# Patient Record
Sex: Female | Born: 1965
Health system: Southern US, Community
[De-identification: ages and names within clinical notes are randomized; demographics above are authoritative.]

## PROBLEM LIST (undated history)

## (undated) DIAGNOSIS — Z853 Personal history of malignant neoplasm of breast: Secondary | ICD-10-CM

## (undated) DIAGNOSIS — E785 Hyperlipidemia, unspecified: Secondary | ICD-10-CM

## (undated) DIAGNOSIS — K219 Gastro-esophageal reflux disease without esophagitis: Secondary | ICD-10-CM

## (undated) DIAGNOSIS — F3289 Other specified depressive episodes: Secondary | ICD-10-CM

## (undated) DIAGNOSIS — F329 Major depressive disorder, single episode, unspecified: Secondary | ICD-10-CM

## (undated) DIAGNOSIS — C801 Malignant (primary) neoplasm, unspecified: Secondary | ICD-10-CM

## (undated) DIAGNOSIS — F411 Generalized anxiety disorder: Secondary | ICD-10-CM

## (undated) DIAGNOSIS — Z923 Personal history of irradiation: Secondary | ICD-10-CM

## (undated) DIAGNOSIS — C7951 Secondary malignant neoplasm of bone: Secondary | ICD-10-CM

## (undated) DIAGNOSIS — J309 Allergic rhinitis, unspecified: Secondary | ICD-10-CM

## (undated) DIAGNOSIS — D509 Iron deficiency anemia, unspecified: Secondary | ICD-10-CM

## (undated) DIAGNOSIS — N200 Calculus of kidney: Secondary | ICD-10-CM

## (undated) HISTORY — PX: TONSILLECTOMY: SUR1361

## (undated) HISTORY — DX: Gastro-esophageal reflux disease without esophagitis: K21.9

## (undated) HISTORY — DX: Allergic rhinitis, unspecified: J30.9

## (undated) HISTORY — DX: Generalized anxiety disorder: F41.1

## (undated) HISTORY — DX: Other specified depressive episodes: F32.89

## (undated) HISTORY — DX: Calculus of kidney: N20.0

## (undated) HISTORY — DX: Hyperlipidemia, unspecified: E78.5

## (undated) HISTORY — PX: TEMPOROMANDIBULAR JOINT SURGERY: SHX35

## (undated) HISTORY — PX: MASTECTOMY: SHX3

## (undated) HISTORY — DX: Iron deficiency anemia, unspecified: D50.9

## (undated) HISTORY — DX: Personal history of malignant neoplasm of breast: Z85.3

## (undated) HISTORY — PX: BREAST ENHANCEMENT SURGERY: SHX7

## (undated) HISTORY — DX: Major depressive disorder, single episode, unspecified: F32.9

---

## 1997-05-09 ENCOUNTER — Encounter: Payer: Self-pay | Admitting: Internal Medicine

## 1997-05-09 LAB — CONVERTED CEMR LAB

## 1997-05-12 ENCOUNTER — Encounter: Payer: Self-pay | Admitting: Internal Medicine

## 1997-05-12 LAB — CONVERTED CEMR LAB

## 2001-04-23 ENCOUNTER — Other Ambulatory Visit: Admission: RE | Admit: 2001-04-23 | Discharge: 2001-04-23 | Payer: Self-pay | Admitting: Gynecology

## 2001-05-21 ENCOUNTER — Other Ambulatory Visit: Admission: RE | Admit: 2001-05-21 | Discharge: 2001-05-21 | Payer: Self-pay | Admitting: Gynecology

## 2003-09-10 ENCOUNTER — Emergency Department (HOSPITAL_COMMUNITY): Admission: EM | Admit: 2003-09-10 | Discharge: 2003-09-10 | Payer: Self-pay | Admitting: Emergency Medicine

## 2004-03-23 ENCOUNTER — Other Ambulatory Visit: Admission: RE | Admit: 2004-03-23 | Discharge: 2004-03-23 | Payer: Self-pay | Admitting: Gynecology

## 2004-04-15 ENCOUNTER — Other Ambulatory Visit: Admission: RE | Admit: 2004-04-15 | Discharge: 2004-04-15 | Payer: Self-pay | Admitting: Gynecology

## 2004-04-23 ENCOUNTER — Ambulatory Visit: Payer: Self-pay | Admitting: Internal Medicine

## 2004-05-24 ENCOUNTER — Ambulatory Visit: Payer: Self-pay | Admitting: Internal Medicine

## 2004-05-26 ENCOUNTER — Ambulatory Visit: Payer: Self-pay | Admitting: Internal Medicine

## 2004-10-25 ENCOUNTER — Emergency Department (HOSPITAL_COMMUNITY): Admission: EM | Admit: 2004-10-25 | Discharge: 2004-10-25 | Payer: Self-pay | Admitting: Emergency Medicine

## 2005-07-20 ENCOUNTER — Ambulatory Visit: Payer: Self-pay | Admitting: Internal Medicine

## 2005-12-08 ENCOUNTER — Ambulatory Visit: Payer: Self-pay | Admitting: Internal Medicine

## 2006-01-02 ENCOUNTER — Ambulatory Visit: Payer: Self-pay | Admitting: Internal Medicine

## 2006-01-24 ENCOUNTER — Ambulatory Visit: Payer: Self-pay | Admitting: Gastroenterology

## 2006-05-20 ENCOUNTER — Ambulatory Visit: Payer: Self-pay | Admitting: Family Medicine

## 2006-07-26 ENCOUNTER — Ambulatory Visit: Payer: Self-pay | Admitting: Internal Medicine

## 2006-08-12 ENCOUNTER — Ambulatory Visit: Payer: Self-pay | Admitting: Family Medicine

## 2006-11-14 ENCOUNTER — Ambulatory Visit: Payer: Self-pay | Admitting: Internal Medicine

## 2007-01-02 ENCOUNTER — Encounter: Payer: Self-pay | Admitting: Internal Medicine

## 2007-01-16 ENCOUNTER — Ambulatory Visit: Payer: Self-pay | Admitting: Internal Medicine

## 2007-01-21 ENCOUNTER — Encounter: Payer: Self-pay | Admitting: Internal Medicine

## 2007-01-21 DIAGNOSIS — N200 Calculus of kidney: Secondary | ICD-10-CM | POA: Insufficient documentation

## 2007-01-21 DIAGNOSIS — Z853 Personal history of malignant neoplasm of breast: Secondary | ICD-10-CM

## 2007-01-21 DIAGNOSIS — J309 Allergic rhinitis, unspecified: Secondary | ICD-10-CM | POA: Insufficient documentation

## 2007-01-21 DIAGNOSIS — F411 Generalized anxiety disorder: Secondary | ICD-10-CM | POA: Insufficient documentation

## 2007-01-21 DIAGNOSIS — F32A Depression, unspecified: Secondary | ICD-10-CM | POA: Insufficient documentation

## 2007-01-21 DIAGNOSIS — F329 Major depressive disorder, single episode, unspecified: Secondary | ICD-10-CM

## 2007-01-21 DIAGNOSIS — E785 Hyperlipidemia, unspecified: Secondary | ICD-10-CM | POA: Insufficient documentation

## 2007-01-21 HISTORY — DX: Personal history of malignant neoplasm of breast: Z85.3

## 2007-09-08 ENCOUNTER — Ambulatory Visit: Payer: Self-pay | Admitting: Internal Medicine

## 2007-09-08 DIAGNOSIS — J019 Acute sinusitis, unspecified: Secondary | ICD-10-CM | POA: Insufficient documentation

## 2007-09-18 ENCOUNTER — Encounter: Payer: Self-pay | Admitting: Internal Medicine

## 2007-09-21 ENCOUNTER — Telehealth: Payer: Self-pay | Admitting: Internal Medicine

## 2007-10-11 ENCOUNTER — Ambulatory Visit: Payer: Self-pay | Admitting: Internal Medicine

## 2007-10-11 DIAGNOSIS — D519 Vitamin B12 deficiency anemia, unspecified: Secondary | ICD-10-CM | POA: Insufficient documentation

## 2007-10-11 DIAGNOSIS — K219 Gastro-esophageal reflux disease without esophagitis: Secondary | ICD-10-CM | POA: Insufficient documentation

## 2007-10-11 DIAGNOSIS — D509 Iron deficiency anemia, unspecified: Secondary | ICD-10-CM | POA: Insufficient documentation

## 2007-10-12 ENCOUNTER — Ambulatory Visit: Payer: Self-pay | Admitting: Internal Medicine

## 2007-11-14 LAB — CONVERTED CEMR LAB
ALT: 12 units/L (ref 0–35)
AST: 11 units/L (ref 0–37)
Albumin: 3.5 g/dL (ref 3.5–5.2)
Alkaline Phosphatase: 50 units/L (ref 39–117)
BUN: 14 mg/dL (ref 6–23)
Basophils Absolute: 0 10*3/uL (ref 0.0–0.1)
Basophils Relative: 0.8 % (ref 0.0–1.0)
Bilirubin Urine: NEGATIVE
Bilirubin, Direct: 0.1 mg/dL (ref 0.0–0.3)
CO2: 26 meq/L (ref 19–32)
Calcium: 8.8 mg/dL (ref 8.4–10.5)
Chloride: 105 meq/L (ref 96–112)
Cholesterol: 157 mg/dL (ref 0–200)
Creatinine, Ser: 0.8 mg/dL (ref 0.4–1.2)
Crystals: NEGATIVE
Eosinophils Absolute: 0.1 10*3/uL (ref 0.0–0.7)
Eosinophils Relative: 1.9 % (ref 0.0–5.0)
GFR calc Af Amer: 101 mL/min
GFR calc non Af Amer: 83 mL/min
Glucose, Bld: 86 mg/dL (ref 70–99)
HCT: 39.2 % (ref 36.0–46.0)
HDL: 34.6 mg/dL — ABNORMAL LOW (ref >39.0)
Hemoglobin: 13.7 g/dL (ref 12.0–15.0)
Ketones, ur: NEGATIVE mg/dL
LDL Cholesterol: 113 mg/dL — ABNORMAL HIGH (ref 0–99)
Leukocytes, UA: NEGATIVE
Lymphocytes Relative: 25.7 % (ref 12.0–46.0)
MCHC: 35 g/dL (ref 30.0–36.0)
MCV: 92.1 fL (ref 78.0–100.0)
Monocytes Absolute: 0.5 10*3/uL (ref 0.1–1.0)
Monocytes Relative: 9 % (ref 3.0–12.0)
Neutro Abs: 3.5 10*3/uL (ref 1.4–7.7)
Neutrophils Relative %: 62.6 % (ref 43.0–77.0)
Nitrite: NEGATIVE
Platelets: 210 10*3/uL (ref 150–400)
Potassium: 3.9 meq/L (ref 3.5–5.1)
RBC: 4.25 M/uL (ref 3.87–5.11)
RDW: 12.5 % (ref 11.5–14.6)
Sodium: 138 meq/L (ref 135–145)
Specific Gravity, Urine: >=1.03 (ref 1.000–1.03)
TSH: 1.02 microintl units/mL (ref 0.35–5.50)
Total Bilirubin: 1 mg/dL (ref 0.3–1.2)
Total CHOL/HDL Ratio: 4.5
Total Protein, Urine: NEGATIVE mg/dL
Total Protein: 6.4 g/dL (ref 6.0–8.3)
Triglycerides: 47 mg/dL (ref 0–149)
Urine Glucose: NEGATIVE mg/dL
Urobilinogen, UA: 0.2 (ref 0.0–1.0)
VLDL: 9 mg/dL (ref 0–40)
WBC, UA: NONE SEEN cells/hpf
WBC: 5.5 10*3/uL (ref 4.5–10.5)
pH: 5.5 (ref 5.0–8.0)

## 2008-04-21 ENCOUNTER — Ambulatory Visit: Payer: Self-pay | Admitting: Internal Medicine

## 2008-06-21 ENCOUNTER — Ambulatory Visit: Payer: Self-pay | Admitting: Family Medicine

## 2009-01-13 ENCOUNTER — Telehealth: Payer: Self-pay | Admitting: Internal Medicine

## 2009-03-02 ENCOUNTER — Ambulatory Visit: Payer: Self-pay | Admitting: Internal Medicine

## 2009-03-02 DIAGNOSIS — H8309 Labyrinthitis, unspecified ear: Secondary | ICD-10-CM | POA: Insufficient documentation

## 2009-03-30 ENCOUNTER — Ambulatory Visit: Payer: Self-pay | Admitting: Internal Medicine

## 2009-03-30 DIAGNOSIS — R062 Wheezing: Secondary | ICD-10-CM | POA: Insufficient documentation

## 2009-08-07 LAB — CONVERTED CEMR LAB: Pap Smear: NORMAL

## 2009-10-14 ENCOUNTER — Ambulatory Visit: Payer: Self-pay | Admitting: Internal Medicine

## 2009-10-14 DIAGNOSIS — H6690 Otitis media, unspecified, unspecified ear: Secondary | ICD-10-CM | POA: Insufficient documentation

## 2010-02-13 ENCOUNTER — Ambulatory Visit: Payer: Self-pay | Admitting: Family Medicine

## 2010-04-13 ENCOUNTER — Ambulatory Visit: Payer: Self-pay | Admitting: Internal Medicine

## 2010-04-20 ENCOUNTER — Ambulatory Visit: Payer: Self-pay | Admitting: Internal Medicine

## 2010-05-24 ENCOUNTER — Other Ambulatory Visit: Payer: Self-pay | Admitting: Internal Medicine

## 2010-05-24 LAB — CBC WITH DIFFERENTIAL/PLATELET
Basophils Absolute: 0.1 10*3/uL (ref 0.0–0.1)
Basophils Relative: 0.7 % (ref 0.0–3.0)
Eosinophils Absolute: 0.1 10*3/uL (ref 0.0–0.7)
Eosinophils Relative: 1.6 % (ref 0.0–5.0)
HCT: 43.2 % (ref 36.0–46.0)
Hemoglobin: 14.9 g/dL (ref 12.0–15.0)
Lymphocytes Relative: 27 % (ref 12.0–46.0)
Lymphs Abs: 2.2 10*3/uL (ref 0.7–4.0)
MCHC: 34.5 g/dL (ref 30.0–36.0)
MCV: 92.4 fl (ref 78.0–100.0)
Monocytes Absolute: 0.6 10*3/uL (ref 0.1–1.0)
Monocytes Relative: 8 % (ref 3.0–12.0)
Neutro Abs: 5 10*3/uL (ref 1.4–7.7)
Neutrophils Relative %: 62.7 % (ref 43.0–77.0)
Platelets: 211 10*3/uL (ref 150.0–400.0)
RBC: 4.67 Mil/uL (ref 3.87–5.11)
RDW: 13.6 % (ref 11.5–14.6)
WBC: 8 10*3/uL (ref 4.5–10.5)

## 2010-05-24 LAB — URINALYSIS, ROUTINE W REFLEX MICROSCOPIC
Bilirubin Urine: NEGATIVE
Ketones, ur: NEGATIVE
Leukocytes, UA: NEGATIVE
Nitrite: NEGATIVE
Specific Gravity, Urine: 1.025 (ref 1.000–1.030)
Total Protein, Urine: NEGATIVE
Urine Glucose: NEGATIVE
Urobilinogen, UA: 0.2 (ref 0.0–1.0)
pH: 5 (ref 5.0–8.0)

## 2010-05-24 LAB — HEPATIC FUNCTION PANEL
ALT: 16 U/L (ref 0–35)
AST: 16 U/L (ref 0–37)
Albumin: 4.1 g/dL (ref 3.5–5.2)
Alkaline Phosphatase: 50 U/L (ref 39–117)
Bilirubin, Direct: 0.1 mg/dL (ref 0.0–0.3)
Total Bilirubin: 0.7 mg/dL (ref 0.3–1.2)
Total Protein: 7 g/dL (ref 6.0–8.3)

## 2010-05-24 LAB — BASIC METABOLIC PANEL
BUN: 12 mg/dL (ref 6–23)
CO2: 25 mEq/L (ref 19–32)
Calcium: 9.2 mg/dL (ref 8.4–10.5)
Chloride: 103 mEq/L (ref 96–112)
Creatinine, Ser: 0.9 mg/dL (ref 0.4–1.2)
GFR: 74.38 mL/min (ref >60.00)
Glucose, Bld: 83 mg/dL (ref 70–99)
Potassium: 4.2 mEq/L (ref 3.5–5.1)
Sodium: 134 mEq/L — ABNORMAL LOW (ref 135–145)

## 2010-05-24 LAB — TSH: TSH: 4.01 u[IU]/mL (ref 0.35–5.50)

## 2010-05-24 LAB — LIPID PANEL
Cholesterol: 195 mg/dL (ref 0–200)
HDL: 40 mg/dL (ref >39.00)
LDL Cholesterol: 127 mg/dL — ABNORMAL HIGH (ref 0–99)
Total CHOL/HDL Ratio: 5
Triglycerides: 141 mg/dL (ref 0.0–149.0)
VLDL: 28.2 mg/dL (ref 0.0–40.0)

## 2010-05-31 DIAGNOSIS — K921 Melena: Secondary | ICD-10-CM | POA: Insufficient documentation

## 2010-06-07 ENCOUNTER — Ambulatory Visit: Payer: Self-pay | Admitting: Hematology & Oncology

## 2010-06-10 NOTE — Assessment & Plan Note (Signed)
Summary: pressure in ear and vertigo-lb   Vital Signs:  Patient profile:   45 year old female Height:      68 inches Weight:      181.25 pounds BMI:     27.66 O2 Sat:      97 % on Room air Temp:     98 degrees F oral Pulse rate:   81 / minute BP sitting:   92 / 62  (left arm) Cuff size:   regular  Vitals Entered ByZella Ball Ewing (October 14, 2009 9:12 AM)  O2 Flow:  Room air CC: Right ear pressure, congestion for 2 weeks/RE   Primary Care Provider:  Corwin Levins MD  CC:  Right ear pressure and congestion for 2 weeks/RE.  History of Present Illness: here with acute onset mild to mod right ear pain, pressure, fever and headache, with mild ST and general weakness and fatigue for 3 days;  Pt denies CP, sob, doe, wheezing, orthopnea, pnd, worsening LE edema, palps, dizziness or syncope   This is in addition to several weeks uncontrolled nasal allergy symptoms with clearish congsetion, without blood or headache or fever but with itch and sneezing.  Also requests refill of the generic prozac, working quite nicely it seems without significant anxiety/panic or worsening depressive symtpoms or suicidal ideation.  Overall good med complaicne, good tolerance.    Problems Prior to Update: 1)  Otitis Media, Right  (ICD-382.9) 2)  Sinusitis- Acute-nos  (ICD-461.9) 3)  Wheezing  (ICD-786.07) 4)  Viral Labyrinthitis  (ICD-386.35) 5)  Sinusitis- Acute-nos  (ICD-461.9) 6)  Family History Colonic Polyps  (ICD-V18.51) 7)  Preventive Health Care  (ICD-V70.0) 8)  Gerd  (ICD-530.81) 9)  Anemia-iron Deficiency  (ICD-280.9) 10)  Sinusitis- Acute-nos  (ICD-461.9) 11)  Allergic Rhinitis  (ICD-477.9) 12)  Hyperlipidemia  (ICD-272.4) 13)  Renal Calculus  (ICD-592.0) 14)  Depression  (ICD-311) 15)  Anxiety  (ICD-300.00) 16)  Breast Cancer, Hx of  (ICD-V10.3)  Medications Prior to Update: 1)  Cetirizine Hcl 10 Mg  Tabs (Cetirizine Hcl) .Marland Kitchen.. 1 By Mouth Once Daily As Needed 2)  Fluoxetine Hcl 20 Mg  Tabs  (Fluoxetine Hcl) .Marland Kitchen.. 1po Once Daily 3)  Flonase 50 Mcg/act  Susp (Fluticasone Propionate) Generic .... 2 Spray/side Once Daily 4)  Clarithromycin 500 Mg Tabs (Clarithromycin) .Marland Kitchen.. 1 By Mouth Two Times A Day 5)  Mucinex 6)  Prednisone 10 Mg Tabs (Prednisone) .... 3po Qd For 3days, Then 2po Qd For 3days, Then 1po Qd For 3days, Then Stop 7)  Hydrocodone-Homatropine 5-1.5 Mg/69ml Syrp (Hydrocodone-Homatropine) .Marland Kitchen.. 1 Tsp By Mouth Q 6 Hrs As Needed Cough  Current Medications (verified): 1)  Cetirizine Hcl 10 Mg  Tabs (Cetirizine Hcl) .Marland Kitchen.. 1 By Mouth Once Daily As Needed 2)  Fluoxetine Hcl 20 Mg  Tabs (Fluoxetine Hcl) .Marland Kitchen.. 1po Once Daily 3)  Flonase 50 Mcg/act  Susp (Fluticasone Propionate) Generic .... 2 Spray/side Once Daily 4)  Levaquin 500 Mg Tabs (Levofloxacin) .Marland Kitchen.. 1po Once Daily 5)  Mucinex  Allergies (verified): 1)  ! Augmentin 2)  ! Codeine 3)  ! 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  , mild ill but overall good attitude Head:  normocephalic and atraumatic.   Eyes:  vision grossly intact, pupils equal, and pupils round.   Ears:  right tm mod to severe erythema, no bulging,  left tm ok;  canals ok Nose:  nasal dischargemucosal pallor and mucosal edema.   Mouth:  pharyngeal erythema and fair dentition.   Neck:  supple and no masses.   Lungs:  normal respiratory effort and normal breath sounds.   Heart:  normal  rate and regular rhythm.   Extremities:  no edema, no erythema    Impression & Recommendations:  Problem # 1:  OTITIS MEDIA, RIGHT (ICD-382.9)  Her updated medication list for this problem includes:    Levaquin 500 Mg Tabs (Levofloxacin) .Marland Kitchen... 1po once daily treat as above, f/u any worsening signs or symptoms   Problem # 2:  ALLERGIC RHINITIS (ICD-477.9)  Her updated medication list for this problem includes:    Cetirizine Hcl 10 Mg Tabs (Cetirizine hcl) .Marland Kitchen... 1 by mouth once daily as needed treat as above, f/u any worsening signs or symptoms   Problem # 3:  ANXIETY (ICD-300.00)  Her updated medication list for this problem includes:    Fluoxetine Hcl 20 Mg Tabs (Fluoxetine hcl) .Marland Kitchen... 1po once daily stable overall by hx and exam, ok to continue meds/tx as is   Complete Medication List: 1)  Cetirizine Hcl 10 Mg Tabs (Cetirizine hcl) .Marland Kitchen.. 1 by mouth once daily as needed 2)  Fluoxetine Hcl 20 Mg Tabs (Fluoxetine hcl) .Marland Kitchen.. 1po once daily 3)  Flonase 50 Mcg/act Susp (fluticasone Propionate) Generic  .... 2 spray/side once daily 4)  Levaquin 500 Mg Tabs (Levofloxacin) .Marland Kitchen.. 1po once daily 5)  Mucinex   Patient Instructions: 1)  Please take all new medications as prescribed 2)  Continue all previous medications as before this visit  3)  You can also use Mucinex OTC or it's generic for congestion  4)  you can also do valsalva maneuver 3 times daily as well to help clear the ears 5)  Please schedule a follow-up appointment in 6 months with CPX labs Prescriptions: FLUOXETINE HCL 20 MG  TABS (FLUOXETINE HCL) 1po once daily  #90 x 3   Entered and Authorized by:   Corwin Levins MD   Signed by:   Corwin Levins MD on 10/14/2009   Method used:   Print then Give to Patient   RxID:   [id number] FLONASE 50 MCG/ACT  SUSP (FLUTICASONE PROPIONATE) GENERIC 2 spray/side once daily  #1 x 11   Entered and Authorized by:   Corwin Levins MD   Signed by:   Corwin Levins MD on 10/14/2009   Method  used:   Print then Give to Patient   RxID:   [id number] LEVAQUIN 500 MG TABS (LEVOFLOXACIN) 1po once daily  #10 x 0   Entered and Authorized by:   Corwin Levins MD   Signed by:   Corwin Levins MD on 10/14/2009   Method used:   Print then Give to Patient   RxID:   [id number]

## 2010-06-10 NOTE — Assessment & Plan Note (Signed)
Summary: SINUS INFECTION/DLO   Vital Signs:  Patient profile:   45 year old female Height:      68 inches Weight:      185.25 pounds BMI:     28.27 O2 Sat:      96 % on Room air Temp:     97.1 degrees F oral Pulse rate:   69 / minute BP sitting:   118 / 82  (left arm) Cuff size:   regular  Vitals Entered By: Jarome Lamas (February 13, 2010 10:42 AM)  O2 Flow:  Room air CC: sinus infection/pb   History of Present Illness: started having pressure in head and ears mon- tues then progressed to green nasal drainage  chills at night  now prod cough -- green d/c  a little tightness/ wheeze works with lots of people   very congested  no n/v/d   throat was sore for 2d - better now     Current Medications (verified): 1)  Cetirizine Hcl 10 Mg  Tabs (Cetirizine Hcl) .Marland Kitchen.. 1 By Mouth Once Daily As Needed 2)  Fluoxetine Hcl 20 Mg  Tabs (Fluoxetine Hcl) .Marland Kitchen.. 1po Once Daily 3)  Flonase 50 Mcg/act  Susp (Fluticasone Propionate) Generic .... 2 Spray/side Once Daily 4)  Levaquin 500 Mg Tabs (Levofloxacin) .Marland Kitchen.. 1po Once Daily 5)  Mucinex  Allergies (verified): 1)  ! Augmentin 2)  ! Codeine 3)  ! 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 Eyes:  Denies blurring and eye irritation. CV:  Denies chest pain or discomfort and  palpitations. Resp:  Complains of cough and sputum productive; denies wheezing. GI:  Denies abdominal pain, diarrhea, nausea, and vomiting. Derm:  Denies rash.  Physical Exam  General:  Well-developed,well-nourished,in no acute distress; alert,appropriate and cooperative throughout examination Head:  normocephalic, atraumatic, and no abnormalities observed.  some R maxillary sinus tenderness  Eyes:  vision grossly intact, pupils equal, pupils round, pupils reactive to light, and no injection.   Ears:  R TM is pink with eff L TM is grey with small eff  Nose:  nares are injected and congested bilaterally much worse on the R Mouth:  pharynx pink and moist.   Neck:  No deformities, masses, or tenderness noted. Lungs:  harsh bs at bases - no rales or rhonchi or wheeze Heart:  normal rate and regular rhythm.   Skin:  Intact without suspicious lesions or rashes Cervical Nodes:  No lymphadenopathy noted Psych:  normal affect, talkative and pleasant    Impression & Recommendations:  Problem # 1:  SINUSITIS - ACUTE-NOS (ICD-461.9) Assessment New  also with R OM and facial pain  tx with levaquin (is pcn all)  recommend sympt care- see pt instructions   pt advised to update me if symptoms worsen or do not improve  Her updated medication list for this problem includes:    Levaquin 500 Mg Tabs (Levofloxacin) .Marland Kitchen... 1 by mouth once daily for 10 days for sinus infection  Orders: Prescription Created Electronically [phone])  Complete Medication List: 1)  Cetirizine Hcl 10 Mg Tabs (Cetirizine hcl) .Marland Kitchen.. 1 by mouth once daily as needed 2)  Fluoxetine Hcl 20 Mg Tabs (Fluoxetine hcl) .Marland Kitchen.. 1po once daily 3)  Flonase 50 Mcg/act Susp (fluticasone Propionate) Generic  .... 2 spray/side once daily 4)  Levaquin 500 Mg Tabs (Levofloxacin) .Marland Kitchen.. 1 by mouth once daily for 10 days for sinus infection 5)  Mucinex   Patient Instructions: 1)  you can try mucinex over the counter twice daily as directed and  nasal saline spray for congestion 2)  tylenol or aleve  over the counter as directed may help with aches, headache and fever 3)  call if symptoms worsen or if not improved in 4-5 days  4)  take the levaquin as directed for ear infection and sinus infection  Prescriptions: LEVAQUIN 500 MG TABS (LEVOFLOXACIN) 1 by mouth once daily for 10 days for sinus infection  #10 x 0   Entered and Authorized by:   Judith Part MD   Signed by:   Judith Part MD on 02/13/2010   Method used:   Electronically to        [facility] (retail)       [address]       [place], [place]  [address]       Ph: [phone]       Fax: [phone]   RxID:   [id number]

## 2010-06-10 NOTE — Assessment & Plan Note (Signed)
Summary: 6 mos f/u / # / cd   Vital Signs:  Patient profile:   45 year old female Height:      69 inches Weight:      189.38 pounds BMI:     28.07 O2 Sat:      97 % on Room air Temp:     97.8 degrees F oral Pulse rate:   86 / minute BP sitting:   112 / 78  (left arm) Cuff size:   regular  Vitals Entered By: Zella Ball Ewing CMA Duncan Dull) (May 31, 2010 1:29 PM)  O2 Flow:  Room air  Preventive Care Screening  Pap Smear:    Date:  08/07/2009    Results:  normal   CC: 6 month Followup/RE   Primary Care Provider:  Corwin Levins MD  CC:  6 month Followup/RE.  History of Present Illness: here for wellness, overall doing ok;  Pt denies CP, worsening sob, doe, wheezing, orthopnea, pnd, worsening LE edema, palps, dizziness or syncope  Pt denies new neuro symptoms such as headache, facial or extremity weakness  Pt denies polydipsia, polyuria, or low sugar symptoms such as shakiness improved with eating.  Overall good compliance with meds, trying to follow low chol, DM diet, wt stable, little excercise however  No  wt loss, night sweats, loss of appetite or other constitutional symptoms  Overall good compliance with meds, and good tolerability.  Denies worsening depressive symptoms, suicidal ideation, or panic.   Pt states good ability with ADL's, low fall risk, home safety reviewed and adequate, no significant change in hearing or vision, trying to follow lower chol diet, and occasionally active only with regular excercise.  Does have 2-3 days acute onset facial pain, pressure, fever and greenish d/c, without ST or cough.   Preventive Screening-Counseling & Management      Drug Use:  no.    Problems Prior to Update: 1)  Hematochezia  (ICD-578.1) 2)  Sinusitis - Acute-nos  (ICD-461.9) 3)  Otitis Media, Right  (ICD-382.9) 4)  Sinusitis- Acute-nos  (ICD-461.9) 5)  Wheezing  (ICD-786.07) 6)  Viral Labyrinthitis  (ICD-386.35) 7)  Sinusitis- Acute-nos  (ICD-461.9) 8)  Family History Colonic  Polyps  (ICD-V18.51) 9)  Preventive Health Care  (ICD-V70.0) 10)  Gerd  (ICD-530.81) 11)  Anemia-iron Deficiency  (ICD-280.9) 12)  Sinusitis- Acute-nos  (ICD-461.9) 13)  Allergic Rhinitis  (ICD-477.9) 14)  Hyperlipidemia  (ICD-272.4) 15)  Renal Calculus  (ICD-592.0) 16)  Depression  (ICD-311) 17)  Anxiety  (ICD-300.00) 18)  Breast Cancer, Hx of  (ICD-V10.3)  Medications Prior to Update: 1)  Cetirizine Hcl 10 Mg  Tabs (Cetirizine Hcl) .Marland Kitchen.. 1 By Mouth Once Daily As Needed 2)  Fluoxetine Hcl 20 Mg  Tabs (Fluoxetine Hcl) .Marland Kitchen.. 1po Once Daily 3)  Flonase 50 Mcg/act  Susp (Fluticasone Propionate) Generic .... 2 Spray/side Once Daily 4)  Levaquin 500 Mg Tabs (Levofloxacin) .Marland Kitchen.. 1 By Mouth Once Daily For 10 Days For Sinus Infection 5)  Mucinex  Current Medications (verified): 1)  Cetirizine Hcl 10 Mg  Tabs (Cetirizine Hcl) .Marland Kitchen.. 1 By Mouth Once Daily As Needed For Allergies 2)  Fluoxetine Hcl 20 Mg  Tabs (Fluoxetine Hcl) .Marland Kitchen.. 1po Once Daily 3)  Flonase 50 Mcg/act  Susp (Fluticasone Propionate) Generic .... 2 Spray/side Once Daily 4)  Levaquin 500 Mg Tabs (Levofloxacin) .Marland Kitchen.. 1 By Mouth Once Daily For 10 Days For Sinus Infection 5)  Mucinex  Allergies (verified): 1)  ! Augmentin 2)  ! Codeine 3)  !  Pcn  Past History:  Past Surgical History: Last updated: 10/11/2007 Breast implants Mastectomy- R L Temporomandibular Joint Surgery Tonsillectomy  Family History: Last updated: 10/11/2007 mother and father with colon polyps mother with uterine cancer HTN DM  Social History: Last updated: 05/31/2010 Married no children [employer] Never Smoked Alcohol use-no Drug use-no  Risk Factors: Smoking Status: never (03/02/2009)  Past Medical History: Breast cancer, hx of - at 45yo Kidney Stones Anxiety Depression Hyperlipidemia Allergic rhinitis Anemia-iron deficiency GERD  Social History: Married no children [employer] [place] [employer] Never Smoked Alcohol  use-no Drug use-no Drug Use:  no  Review of Systems  The patient denies anorexia, weight loss, vision loss, decreased hearing, hoarseness, chest pain, syncope, dyspnea on exertion, peripheral edema, prolonged cough, headaches, hemoptysis, abdominal pain, melena, hematochezia, severe indigestion/heartburn, hematuria, muscle weakness, suspicious skin lesions, transient blindness, difficulty walking, depression, unusual weight change, abnormal bleeding, enlarged lymph nodes, and angioedema.         all otherwise negative per pt -  except does want to try colonscopy again for hx of hematochezia (but none recent) and could not tolerate well the prep with last attempted colonscopy  Physical Exam  General:  Well-developed,well-nourished,in no acute distress; alert,appropriate and cooperative throughout examination Head:  normocephalic, atraumatic, and no abnormalities observed. Eyes:  vision grossly intact, pupils equal, pupils round, pupils reactive to light, and no injection.   Ears:  R ear normal and L ear normal.  , sinus tender  bilat Nose:  no external deformity and no nasal discharge.   Mouth:  pharyngeal erythema and fair dentition.   Neck:  No deformities, masses, or tenderness noted. Lungs:  normal respiratory effort and normal breath sounds.   Heart:  normal rate and regular rhythm.   Abdomen:  Bowel sounds positive,abdomen soft and non-tender without masses, organomegaly or hernias noted. Msk:  no joint tenderness and no joint swelling.   Extremities:  no edema, no erythema  Neurologic:  No cranial nerve deficits noted. Station and gait are normal. Plantar reflexes are down-going bilaterally. DTRs are symmetrical throughout. Sensory, motor and coordinative functions appear intact. Skin:  color normal and no rashes.   Psych:  not anxious appearing and not depressed appearing.     Impression & Recommendations:  Problem # 1:  Preventive Health Care (ICD-V70.0) Overall doing well,  age appropriate education and counseling updated, referral for preventive services and immunizations addressed, dietary counseling and smoking status adressed , most recent labs reviewed I have personally reviewed and have noted 1.The patient's medical and social history 2.Their use of alcohol, tobacco or illicit drugs 3.Their current medications and supplements 4. Functional ability including ADL's, fall risk, home safety risk, hearing & visual impairment  5.Diet and physical activities 6.Evidence for depression or mood disorders The patients weight, height, BMI  have been recorded in the chart I have made referrals, counseling and provided education to the patient based review of the above   Problem # 2:  HEMATOCHEZIA (ICD-578.1)  lsat episode approx 53yr? per pt; she is now willing to try again colonscopy  Orders: Gastroenterology Referral (GI)  Problem # 3:  BREAST CANCER, HX OF (ICD-V10.3)  due for f/u , and also ? of how to do approp routine screeniung with her hx, as well as current implants - for oncology referral  Orders: Oncology Referral (Oncology)  Problem # 4:  SINUSITIS - ACUTE-NOS (ICD-461.9)  Her updated medication list for this problem includes:    Levaquin 500 Mg  Tabs (Levofloxacin) .Marland Kitchen... 1 by mouth once daily for 10 days for sinus infection treat as above, f/u any worsening signs or symptoms   Complete Medication List: 1)  Cetirizine Hcl 10 Mg Tabs (Cetirizine hcl) .Marland Kitchen.. 1 by mouth once daily as needed for allergies 2)  Fluoxetine Hcl 20 Mg Tabs (Fluoxetine hcl) .Marland Kitchen.. 1po once daily 3)  Flonase 50 Mcg/act Susp (fluticasone Propionate) Generic  .... 2 spray/side once daily 4)  Levaquin 500 Mg Tabs (Levofloxacin) .Marland Kitchen.. 1 by mouth once daily for 10 days for sinus infection 5)  Mucinex   Other Orders: Tdap => 43yrs IM (96295) Admin 1st Vaccine (28413)  Patient Instructions: 1)  You will be contacted about the referral(s) to: colonoscopy, and Oncology 2)  you had  the tetanus shot today 3)  Please take all new medications as prescribed - the antibiotic 4)  Continue all previous medications as before this visit 5)  Please schedule a follow-up appointment in 1 year, or sooner if needed Prescriptions: FLONASE 50 MCG/ACT  SUSP (FLUTICASONE PROPIONATE) GENERIC 2 spray/side once daily  #1 x 11   Entered and Authorized by:   Corwin Levins MD   Signed by:   Corwin Levins MD on 05/31/2010   Method used:   Print then Give to Patient   RxID:   [id number] FLUOXETINE HCL 20 MG  TABS (FLUOXETINE HCL) 1po once daily  #90 x 3   Entered and Authorized by:   Corwin Levins MD   Signed by:   Corwin Levins MD on 05/31/2010   Method used:   Print then Give to Patient   RxID:   [id number] CETIRIZINE HCL 10 MG  TABS (CETIRIZINE HCL) 1 by mouth once daily as needed for allergies  #30 x 11   Entered and Authorized by:   Corwin Levins MD   Signed by:   Corwin Levins MD on 05/31/2010   Method used:   Print then Give to Patient   RxID:   [id number] LEVAQUIN 500 MG TABS (LEVOFLOXACIN) 1 by mouth once daily for 10 days for sinus infection  #10 x 0   Entered and Authorized by:   Corwin Levins MD   Signed by:   Corwin Levins MD on 05/31/2010   Method used:   Print then Give to Patient   RxID:   [id number]    Orders Added: 1)  Tdap => 5yrs IM [54270] 2)  Admin 1st Vaccine [62376] 3)  Oncology Referral [Oncology] 4)  Gastroenterology Referral [GI] 5)  Est. Patient 40-64 years [28315]   Immunizations Administered:  Tetanus Vaccine:    Vaccine Type: Tdap    Site: left deltoid    Mfr: Sanofi Pasteur    Dose: 0.5 ml    Route: IM    Given by: Zella Ball Ewing CMA (AAMA)    Exp. Date: 08/26/2011    Lot #: [id number]    VIS given: 03/26/08 version given May 31, 2010. da  Immunizations Administered:  Tetanus Vaccine:    Vaccine Type: Tdap    Site: left deltoid    Mfr: Sanofi Pasteur    Dose: 0.5 ml    Route: IM    Given by: Zella Ball Ewing CMA (AAMA)     Exp. Date: 08/26/2011    Lot #: [id number]    VIS given: 03/26/08 version given May 31, 2010.

## 2010-07-08 ENCOUNTER — Ambulatory Visit: Payer: Self-pay | Admitting: Hematology & Oncology

## 2010-07-29 ENCOUNTER — Encounter (HOSPITAL_BASED_OUTPATIENT_CLINIC_OR_DEPARTMENT_OTHER): Payer: Managed Care, Other (non HMO) | Admitting: Hematology & Oncology

## 2010-07-29 DIAGNOSIS — Z853 Personal history of malignant neoplasm of breast: Secondary | ICD-10-CM

## 2010-07-29 DIAGNOSIS — K5900 Constipation, unspecified: Secondary | ICD-10-CM

## 2010-09-24 NOTE — Assessment & Plan Note (Signed)
[facility]                           GASTROENTEROLOGY OFFICE NOTE   VILLA, BURGIN                       MRN:          [id number]  DATE:01/24/2006                            DOB:          [birth year]    REFERRING PHYSICIAN:  Corwin Levins, MD   REASON FOR REFERRAL:  Dr. Jonny Ruiz asked me to evaluate Caitlin Greene in  consultation regarding intermittent rectal bleeding, family history of colon  polyps.   HISTORY OF PRESENT ILLNESS:  Caitlin Greene is a very pleasant, 45 year old  woman who has had 2 episodes of minor rectal bleeding.  One was around the  time of prepping for a colonoscopy, approximately 2 to 3 years ago.  She  said with the prep, she was very nauseous and had some fevers even, and at  the end of her diarrhea that resulted from the prep, she did see some minor  rectal bleeding.  She has also had what sounds like a gastroenteritis  episode a year ago and saw some minor rectal bleeding at the same time then.  She does have some mild constipation and moves her bowels every other day,  sometimes with straining.  She says fiber supplements do help.   REVIEW OF SYSTEMS:  No recent weight gain, otherwise essentially normal and  it is available on her nursing intake sheet.   PAST MEDICAL HISTORY:  History of breast cancer in her 25s, treated with  surgery.  Kidney stones, anxiety.   CURRENT MEDICATIONS:  Zyrtec, Prozac, Rhinocort.   ALLERGIES:  AUGMENTIN.   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   FAMILY HISTORY:  Both mother and father with colon polyps.   PHYSICAL EXAMINATION:  Five foot, 8 inches, 178 pounds.  Blood pressure  112/68.  Pulse 96.  CONSTITUTIONAL:  Generally well-appearing.  NEUROLOGIC:  Alert and oriented x3.  EYES:  Extraocular movements intact.  Mouth and oropharynx moist.  No  lesions.  NECK:  Supple.  No lymphadenopathy.  CARDIOVASCULAR:  Heart  regular rate rhythm.  LUNGS:  Clear to auscultation bilaterally.  ABDOMEN:  Soft, nontender, nondistended, normal bowel sounds.  EXTREMITIES:  No lower extremity edema.  SKIN:  No rashes or lesions on visible extremities.   ASSESSMENT AND PLAN:  A 45 year old woman with intermittent limited rectal  bleeding, family history of colon polyps, intermittent constipation.   First, for her intermittent constipation, I think she should be on fiber  supplements on a daily basis rather than as needed.  Her rectal bleeding  seems minor but, with that and her family history of colon polyps, she  should have a full screening colonoscopy as soon as convenient.  We will  also have her get a set of a complete metabolic profile, CBC, as well as  thyroid studies to look into other causes for constipation to see if she is  anemic from her mild rectal bleeding.  Rachael Fee, MD   DPJ/MedQ  DD:  01/24/2006  DT:  01/24/2006  Job #:  [id number]   cc:   Corwin Levins, MD

## 2010-11-01 ENCOUNTER — Other Ambulatory Visit: Payer: Self-pay

## 2010-11-01 MED ORDER — FLUOXETINE HCL 20 MG PO CAPS: 20.0000 mg | ORAL_CAPSULE | Freq: Every day | ORAL | Status: DC

## 2010-11-13 ENCOUNTER — Ambulatory Visit (INDEPENDENT_AMBULATORY_CARE_PROVIDER_SITE_OTHER): Payer: Managed Care, Other (non HMO) | Admitting: Family Medicine

## 2010-11-13 ENCOUNTER — Encounter: Payer: Self-pay | Admitting: Family Medicine

## 2010-11-13 VITALS — BP 110/70 | HR 68 | Temp 97.5°F | Ht 69.0 in | Wt 192.0 lb

## 2010-11-13 DIAGNOSIS — R350 Frequency of micturition: Secondary | ICD-10-CM | POA: Insufficient documentation

## 2010-11-13 DIAGNOSIS — J019 Acute sinusitis, unspecified: Secondary | ICD-10-CM

## 2010-11-13 LAB — POCT URINALYSIS DIPSTICK
Bilirubin, UA: NEGATIVE
Blood, UA: NEGATIVE
Glucose, UA: NEGATIVE
Ketones, UA: NEGATIVE
Nitrite, UA: NEGATIVE
Spec Grav, UA: 1.03
Urobilinogen, UA: NEGATIVE
pH, UA: 6

## 2010-11-13 MED ORDER — SULFAMETHOXAZOLE-TRIMETHOPRIM 800-160 MG PO TABS: 1.0000 | ORAL_TABLET | Freq: Two times a day (BID) | ORAL | Status: AC

## 2010-11-13 NOTE — Progress Notes (Signed)
SUBJECTIVE:  Caitlin Greene is a 45 y.o. female who complains of coryza, congestion, sneezing, sore throat and dry cough for 7 days. She denies a history of fevers, shortness of breath, vomiting, weakness and weight loss and denies a history of asthma. Patient does not smoke cigarettes. Feels like symptoms are getting progressively worse, now having facial pain.  Also complaining of some suprapubic pressure.  Does have some increased urinary frequency.  No dysuria.   No CVA tenderness.   Patient Active Problem List  Diagnoses  . HYPERLIPIDEMIA  . ANEMIA-IRON DEFICIENCY  . ANXIETY  . DEPRESSION  . OTITIS MEDIA, RIGHT  . VIRAL LABYRINTHITIS  . Acute Sinusitis, Unspecified  . ALLERGIC RHINITIS  . GERD  . RENAL CALCULUS  . WHEEZING  . BREAST CANCER, HX OF  . HEMATOCHEZIA   No past medical history on file. No past surgical history on file. History  Substance Use Topics  . Smoking status: Never Smoker   . Smokeless tobacco: Never Used  . Alcohol Use: No   No family history on file. Allergies  Allergen Reactions  . Codeine   . YQM:VHQIONGEXBM+WUXLKGMWN+UUVOZDGUYQ Acid+Aspartame   . Penicillins    Current Outpatient Prescriptions on File Prior to Visit  Medication Sig Dispense Refill  . FLUoxetine (PROZAC) 20 MG capsule Take 1 capsule (20 mg total) by mouth daily.  90 capsule  2   OBJECTIVE: BP 110/70  Pulse 68  Temp(Src) 97.5 F (36.4 C) (Oral)  Ht 5\' 9"  (1.753 m)  Wt 192 lb (87.091 kg)  BMI 28.35 kg/m2  LMP 11/05/2010  She appears well, vital signs are as noted. Ears normal.  Throat and pharynx normal.  Neck supple. No adenopathy in the neck. Nose is congested. Sinuses  tender. The chest is clear, without wheezes or rales. Abd:  Soft, NT, no CVA tenderness Ext:  No edema  Assessment and Plan: 1. Acute sinusitis, unspecified  Given duration and progression of symptoms, will treat for bacterial sinusitis.   2. Urinary frequency  UA pos for LE. Will give bactrim  to cover for sinusitis and UTI.

## 2010-11-17 ENCOUNTER — Encounter: Payer: Self-pay | Admitting: Internal Medicine

## 2010-11-17 DIAGNOSIS — Z0000 Encounter for general adult medical examination without abnormal findings: Secondary | ICD-10-CM | POA: Insufficient documentation

## 2010-11-18 ENCOUNTER — Ambulatory Visit (INDEPENDENT_AMBULATORY_CARE_PROVIDER_SITE_OTHER): Payer: Managed Care, Other (non HMO) | Admitting: Internal Medicine

## 2010-11-18 ENCOUNTER — Encounter: Payer: Self-pay | Admitting: Internal Medicine

## 2010-11-18 ENCOUNTER — Other Ambulatory Visit (INDEPENDENT_AMBULATORY_CARE_PROVIDER_SITE_OTHER): Payer: Managed Care, Other (non HMO)

## 2010-11-18 VITALS — BP 110/72 | HR 71 | Temp 97.4°F | Ht 68.5 in | Wt 192.0 lb

## 2010-11-18 DIAGNOSIS — R109 Unspecified abdominal pain: Secondary | ICD-10-CM

## 2010-11-18 DIAGNOSIS — R350 Frequency of micturition: Secondary | ICD-10-CM

## 2010-11-18 DIAGNOSIS — F411 Generalized anxiety disorder: Secondary | ICD-10-CM

## 2010-11-18 LAB — CBC WITH DIFFERENTIAL/PLATELET
Basophils Absolute: 0 10*3/uL (ref 0.0–0.1)
Basophils Relative: 0.5 % (ref 0.0–3.0)
Eosinophils Absolute: 0.1 10*3/uL (ref 0.0–0.7)
Eosinophils Relative: 1.1 % (ref 0.0–5.0)
HCT: 40.3 % (ref 36.0–46.0)
Hemoglobin: 13.9 g/dL (ref 12.0–15.0)
Lymphocytes Relative: 22.6 % (ref 12.0–46.0)
Lymphs Abs: 2 10*3/uL (ref 0.7–4.0)
MCHC: 34.5 g/dL (ref 30.0–36.0)
MCV: 93.9 fl (ref 78.0–100.0)
Monocytes Absolute: 0.9 10*3/uL (ref 0.1–1.0)
Monocytes Relative: 10.2 % (ref 3.0–12.0)
Neutro Abs: 5.7 10*3/uL (ref 1.4–7.7)
Neutrophils Relative %: 65.6 % (ref 43.0–77.0)
Platelets: 228 10*3/uL (ref 150.0–400.0)
RBC: 4.29 Mil/uL (ref 3.87–5.11)
RDW: 13.5 % (ref 11.5–14.6)
WBC: 8.8 10*3/uL (ref 4.5–10.5)

## 2010-11-18 LAB — URINALYSIS, ROUTINE W REFLEX MICROSCOPIC
Bilirubin Urine: NEGATIVE
Ketones, ur: NEGATIVE
Leukocytes, UA: NEGATIVE
Nitrite: NEGATIVE
Specific Gravity, Urine: >=1.03 (ref 1.000–1.030)
Total Protein, Urine: NEGATIVE
Urine Glucose: NEGATIVE
Urobilinogen, UA: 1 (ref 0.0–1.0)
pH: 6 (ref 5.0–8.0)

## 2010-11-18 LAB — BASIC METABOLIC PANEL
BUN: 13 mg/dL (ref 6–23)
CO2: 24 mEq/L (ref 19–32)
Calcium: 9.1 mg/dL (ref 8.4–10.5)
Chloride: 103 mEq/L (ref 96–112)
Creatinine, Ser: 0.8 mg/dL (ref 0.4–1.2)
GFR: 77.29 mL/min (ref >60.00)
Glucose, Bld: 95 mg/dL (ref 70–99)
Potassium: 4.1 mEq/L (ref 3.5–5.1)
Sodium: 138 mEq/L (ref 135–145)

## 2010-11-18 MED ORDER — LEVOFLOXACIN 500 MG PO TABS: 500.0000 mg | ORAL_TABLET | Freq: Every day | ORAL | Status: AC

## 2010-11-18 NOTE — Patient Instructions (Addendum)
Take all new medications as prescribed - the antibiotic Continue all other medications as before Please go to LAB in the Basement for the blood and/or urine tests to be done today Please call the phone number [phone] (the PhoneTree System) for results of testing in 2-3 days;  When calling, simply dial the number, and when prompted enter the MRN number above (the Medical Record Number) and the # key, then the message should start. Please return in 6 mo with Lab testing done 3-5 days before

## 2010-11-18 NOTE — Assessment & Plan Note (Signed)
Mid and RLQ, prob UTI ? Not well tx with biaxin recent tx;  For UA/cx, if neg would consider CT abd/pelvis; also for levaquin course

## 2010-11-18 NOTE — Progress Notes (Signed)
Subjective:    Patient ID: Caitlin Greene, female    DOB: 12-13-1965, 45 y.o.   MRN: [id number]  HPI  Here with acute onset 1 wk recurrant lower abd/left side/left flank area pain assoc with urinary frequency it seems and nausea, though no f/c, vomiting, urgency, dysuria or hematuria.  Increased in frequency and severity now mod per pt, not better or worse with anything;  Not better with recent biaxin for sinus infection.  Pt denies chest pain, increased sob or doe, wheezing, orthopnea, PND, increased LE swelling, palpitations, dizziness or syncope.  Pt denies new neurological symptoms such as new headache, or facial or extremity weakness or numbness  Denies worsening depressive symptoms, suicidal ideation, or panic, though has ongoing anxiety, not increased recently.  Past Medical History  Diagnosis Date  . BREAST CANCER, HX OF 01/21/2007    at 45yo  . ANXIETY 01/21/2007  . DEPRESSION 01/21/2007  . HYPERLIPIDEMIA 01/21/2007  . ALLERGIC RHINITIS 01/21/2007  . GERD 10/11/2007  . RENAL CALCULUS 01/21/2007  . ANEMIA-IRON DEFICIENCY 10/11/2007  . Kidney stones   . Cancer    Past Surgical History  Procedure Date  . Mastectomy     right  . Tonsillectomy   . Breast enhancement surgery   . Temporomandibular joint surgery     Left    reports that she has never smoked. She has never used smokeless tobacco. She reports that she does not drink alcohol or use illicit drugs. family history includes Cancer in her mother; Colon polyps in her father and mother; Diabetes in her other; and Hypertension in her other. Allergies  Allergen Reactions  . Augmentin   . Codeine   . Penicillins    Current Outpatient Prescriptions on File Prior to Visit  Medication Sig Dispense Refill  . cetirizine (ZYRTEC) 10 MG tablet Take 10 mg by mouth daily.        Marland Kitchen FLUoxetine (PROZAC) 20 MG capsule Take 1 capsule (20 mg total) by mouth daily.  90 capsule  2  . fluticasone (FLONASE) 50 MCG/ACT nasal spray Place 2 sprays into  the nose daily.        Marland Kitchen guaiFENesin (MUCINEX) 600 MG 12 hr tablet Take 1,200 mg by mouth 2 (two) times daily.        Marland Kitchen sulfamethoxazole-trimethoprim (SEPTRA DS) 800-160 MG per tablet Take 1 tablet by mouth 2 (two) times daily.  10 tablet  0   Review of Systems Review of Systems  Constitutional: Negative for diaphoresis and unexpected weight change.  HENT: Negative for drooling and tinnitus.   Eyes: Negative for photophobia and visual disturbance.  Respiratory: Negative for choking and stridor.   Gastrointestinal: Negative for vomiting and blood in stool.  Genitourinary: Negative for hematuria and decreased urine volume.        Objective:   Physical Exam BP 110/72  Pulse 71  Temp(Src) 97.4 F (36.3 C) (Oral)  Ht 5' 8.5" (1.74 m)  Wt 192 lb (87.091 kg)  BMI 28.77 kg/m2  SpO2 98%  LMP 11/05/2010 Physical Exam  VS noted Constitutional: Pt appears well-developed and well-nourished.  HENT: Head: Normocephalic.  Right Ear: External ear normal.  Left Ear: External ear normal.  Eyes: Conjunctivae and EOM are normal. Pupils are equal, round, and reactive to light.  Neck: Normal range of motion. Neck supple.  Cardiovascular: Normal rate and regular rhythm.   Pulmonary/Chest: Effort normal and breath sounds normal.  Abd:  Soft, non-distended, + BS, mild to mod tender LLQ without guarding Neurological:  Pt is alert. No cranial nerve deficit.  Skin: Skin is warm. No erythema.  Psychiatric: Pt behavior is normal. Thought content normal. 1-2+ nervous        Assessment & Plan:

## 2010-11-19 ENCOUNTER — Ambulatory Visit: Payer: Managed Care, Other (non HMO) | Admitting: Internal Medicine

## 2010-11-19 ENCOUNTER — Ambulatory Visit (INDEPENDENT_AMBULATORY_CARE_PROVIDER_SITE_OTHER)
Admission: RE | Admit: 2010-11-19 | Discharge: 2010-11-19 | Disposition: A | Discharge status: Home / Self Care | Payer: Managed Care, Other (non HMO) | Source: Ambulatory Visit | Attending: Internal Medicine | Admitting: Internal Medicine

## 2010-11-19 DIAGNOSIS — R109 Unspecified abdominal pain: Secondary | ICD-10-CM

## 2010-11-19 HISTORY — DX: Malignant (primary) neoplasm, unspecified: C80.1

## 2010-11-19 LAB — URINE CULTURE: Colony Count: 7000

## 2010-11-19 MED ORDER — IOHEXOL 300 MG/ML  SOLN
100.0000 mL | Freq: Once | INTRAMUSCULAR | Status: AC | PRN
  Administered 2010-11-19: 100 mL via INTRAVENOUS

## 2010-11-19 NOTE — Progress Notes (Signed)
Quick Note:  Voice message left on PhoneTree system - lab is negative, normal or otherwise stable, pt to continue same tx ______ 

## 2010-11-19 NOTE — Progress Notes (Signed)
Left message on machine for pt to return my call  

## 2010-11-24 ENCOUNTER — Telehealth: Payer: Self-pay | Admitting: *Deleted

## 2010-11-24 NOTE — Telephone Encounter (Signed)
Requesting results of CT scan done on Friday.

## 2010-11-24 NOTE — Telephone Encounter (Signed)
Ct results on phonetree

## 2010-11-24 NOTE — Telephone Encounter (Signed)
Left message for pt to callback office.  

## 2010-11-24 NOTE — Telephone Encounter (Signed)
Pt informed to check phone tree for CT results

## 2010-11-28 ENCOUNTER — Encounter: Payer: Self-pay | Admitting: Internal Medicine

## 2010-11-28 NOTE — Assessment & Plan Note (Signed)
stable overall by hx and exam, most recent data reviewed with pt, and pt to continue medical treatment as before  Lab Results  Component Value Date   WBC 8.8 11/18/2010   HGB 13.9 11/18/2010   HCT 40.3 11/18/2010   PLT 228.0 11/18/2010   CHOL 195 05/24/2010   TRIG 141.0 05/24/2010   HDL 40.00 05/24/2010   ALT 16 05/24/2010   AST 16 05/24/2010   NA 138 11/18/2010   K 4.1 11/18/2010   CL 103 11/18/2010   CREATININE 0.8 11/18/2010   BUN 13 11/18/2010   CO2 24 11/18/2010   TSH 4.01 05/24/2010

## 2010-11-28 NOTE — Assessment & Plan Note (Signed)
?   UTI related or other, doubt DM, consider urology if not improved

## 2011-03-17 ENCOUNTER — Ambulatory Visit (INDEPENDENT_AMBULATORY_CARE_PROVIDER_SITE_OTHER): Payer: Managed Care, Other (non HMO) | Admitting: Internal Medicine

## 2011-03-17 ENCOUNTER — Encounter: Payer: Self-pay | Admitting: Internal Medicine

## 2011-03-17 VITALS — BP 100/62 | HR 78 | Temp 98.9°F | Ht 68.0 in | Wt 189.5 lb

## 2011-03-17 DIAGNOSIS — Z0000 Encounter for general adult medical examination without abnormal findings: Secondary | ICD-10-CM

## 2011-03-17 DIAGNOSIS — Z23 Encounter for immunization: Secondary | ICD-10-CM

## 2011-03-17 DIAGNOSIS — J019 Acute sinusitis, unspecified: Secondary | ICD-10-CM | POA: Insufficient documentation

## 2011-03-17 MED ORDER — DOXYCYCLINE HYCLATE 100 MG PO TABS: 100.0000 mg | ORAL_TABLET | Freq: Two times a day (BID) | ORAL | Status: AC

## 2011-03-17 NOTE — Patient Instructions (Addendum)
Take all new medications as prescribed Continue all other medications as before You can also take Delsym OTC for cough, and/or Mucinex (or it's generic off brand) for congestion Please return in 6 mo with Lab testing done 3-5 days before

## 2011-03-19 ENCOUNTER — Encounter: Payer: Self-pay | Admitting: Internal Medicine

## 2011-03-19 NOTE — Assessment & Plan Note (Signed)
Mild to mod, for antibx course,  to f/u any worsening symptoms or concerns 

## 2011-03-19 NOTE — Progress Notes (Signed)
  Subjective:    Patient ID: Caitlin Greene, female    DOB: 1966/02/03, 45 y.o.   MRN: [id number]  HPI   Here with 3 days acute onset fever, facial pain, pressure, general weakness and malaise, and greenish d/c, with slight ST, but little to no cough and Pt denies chest pain, increased sob or doe, wheezing, orthopnea, PND, increased LE swelling, palpitations, dizziness or syncope.  Pt denies new neurological symptoms such as new headache, or facial or extremity weakness or numbness   Pt denies polydipsia, polyuria, Past Medical History  Diagnosis Date  . BREAST CANCER, HX OF 01/21/2007    at 45yo  . ANXIETY 01/21/2007  . DEPRESSION 01/21/2007  . HYPERLIPIDEMIA 01/21/2007  . ALLERGIC RHINITIS 01/21/2007  . GERD 10/11/2007  . RENAL CALCULUS 01/21/2007  . ANEMIA-IRON DEFICIENCY 10/11/2007  . Kidney stones   . Cancer    Past Surgical History  Procedure Date  . Mastectomy     right  . Tonsillectomy   . Breast enhancement surgery   . Temporomandibular joint surgery     Left    reports that she has never smoked. She has never used smokeless tobacco. She reports that she does not drink alcohol or use illicit drugs. family history includes Cancer in her mother; Colon polyps in her father and mother; Diabetes in her other; and Hypertension in her other. Allergies  Allergen Reactions  . Augmentin   . Codeine   . Penicillins    Current Outpatient Prescriptions on File Prior to Visit  Medication Sig Dispense Refill  . cetirizine (ZYRTEC) 10 MG tablet Take 10 mg by mouth daily.        Marland Kitchen FLUoxetine (PROZAC) 20 MG capsule Take 1 capsule (20 mg total) by mouth daily.  90 capsule  2  . fluticasone (FLONASE) 50 MCG/ACT nasal spray Place 2 sprays into the nose daily.        Marland Kitchen guaiFENesin (MUCINEX) 600 MG 12 hr tablet Take 1,200 mg by mouth 2 (two) times daily.          Review of Systems All otherwise neg per pt     Objective:   Physical Exam BP 100/62  Pulse 78  Temp(Src) 98.9 F (37.2 C) (Oral)   Ht 5\' 8"  (1.727 m)  Wt 189 lb 8 oz (85.957 kg)  BMI 28.81 kg/m2  SpO2 94%  LMP 03/15/2011 Physical Exam  VS noted, mild ill Constitutional: Pt appears well-developed and well-nourished.  HENT: Head: Normocephalic.  Right Ear: External ear normal.  Left Ear: External ear normal.  Bilat tm's mild erythema.  Sinus tender bilat.  Pharynx mild erythema Eyes: Conjunctivae and EOM are normal. Pupils are equal, round, and reactive to light.  Neck: Normal range of motion. Neck supple.  Cardiovascular: Normal rate and regular rhythm.   Pulmonary/Chest: Effort normal and breath sounds normal.  Neurological: Pt is alert. No cranial nerve deficit.  Skin: Skin is warm. No erythema.  Psychiatric: Pt behavior is normal. Thought content normal.     Assessment & Plan:

## 2011-04-29 ENCOUNTER — Other Ambulatory Visit: Payer: Self-pay | Admitting: Internal Medicine

## 2011-06-06 ENCOUNTER — Telehealth: Payer: Self-pay

## 2011-06-06 NOTE — Telephone Encounter (Signed)
[facility] Triage Call Report Triage Record Num: [id number] Operator: Craig Guess Patient Name: Caitlin Greene Call Date & Time: 06/04/2011 12:39:25PM Patient Phone: [phone] PCP: Oliver Barre Patient Gender: Female PCP Fax : [phone] Patient DOB: Feb 16, 1966 Practice Name: [facility] Reason for Call: Caller: [name]/Patient; PCP: Oliver Barre; CB#: [phone]; Call Reason: Caller reports sxs of uti that includes freq, urgency, and dysuria that began a week ago. Home care of water and Cranberry juice has not helped with sxs. Afebrile. Caller advised we can not call in any antibxs and she should be seen at [facility] for eval of sxs. She is agreeable, locations of local UC's given. Protocol(s) Used: Office Note Recommended Outcome per Protocol: Information Noted and Sent to Office Reason for Outcome: Caller information to office Care Advice: ~ 06/04/2011 12:47:44PM Page 1 of 1 [email or web address]

## 2011-06-08 DIAGNOSIS — Z0279 Encounter for issue of other medical certificate: Secondary | ICD-10-CM

## 2011-07-29 ENCOUNTER — Telehealth: Payer: Self-pay | Admitting: Hematology & Oncology

## 2011-07-29 ENCOUNTER — Ambulatory Visit: Payer: Managed Care, Other (non HMO) | Admitting: Family

## 2011-07-29 NOTE — Telephone Encounter (Signed)
Pt called moved 3-25 to 4-29 she is having to take care of sister.

## 2011-08-01 ENCOUNTER — Ambulatory Visit: Payer: Managed Care, Other (non HMO) | Admitting: Hematology & Oncology

## 2011-08-29 ENCOUNTER — Ambulatory Visit (HOSPITAL_BASED_OUTPATIENT_CLINIC_OR_DEPARTMENT_OTHER): Payer: Managed Care, Other (non HMO) | Admitting: Hematology & Oncology

## 2011-08-29 VITALS — BP 121/77 | HR 76 | Temp 96.6°F | Ht 68.0 in

## 2011-08-29 DIAGNOSIS — Z853 Personal history of malignant neoplasm of breast: Secondary | ICD-10-CM

## 2011-08-29 DIAGNOSIS — Z9010 Acquired absence of unspecified breast and nipple: Secondary | ICD-10-CM

## 2011-08-29 DIAGNOSIS — C50919 Malignant neoplasm of unspecified site of unspecified female breast: Secondary | ICD-10-CM

## 2011-08-29 DIAGNOSIS — M810 Age-related osteoporosis without current pathological fracture: Secondary | ICD-10-CM

## 2011-08-29 NOTE — Progress Notes (Signed)
This office note has been dictated.

## 2011-08-29 NOTE — Progress Notes (Signed)
DIAGNOSIS:  Early stage, likely stage I breast cancer of the right breast at age 46.  CURRENT THERAPY:  Observation.  INTERIM HISTORY:  Caitlin Greene comes in for her yearly followup.  We initially saw her last year.  She had breast cancer of the right breast back when she was 46 years old.  She had a right mastectomy.  She had a left reduction mammoplasty.  She had an implant with both breasts.  She did have genetic tests done.  She is BRCA negative.  She has been doing well.  She has had no complaints.  She still has her menstrual cycles.  She has had no change in medications.  She has had no bony pain.  In fact, she is not taking vitamin D.  I really believe this is helpful for her.  She is not taking baby aspirin.  I also feel this is helpful for her.  She has had no rashes.  There has been no change in bowel or bladder habits.  She has had no headache.  There has been no double vision or blurred vision.  PHYSICAL EXAMINATION:  General:  This is a well-developed, well- nourished white female in no obvious distress.  Vital signs:  Show a temperature of 96.6, pulse 76, respiratory rate 18, blood pressure 121/77.  Weight is 191.  Head and neck:  Exam shows a normocephalic, atraumatic skull.  There are no ocular or oral lesions.  There are no palpable cervical or supraclavicular lymph nodes.  Lungs:  Clear bilaterally.  Cardiac:  Regular rate and rhythm with a normal S1 and S2. There are no murmurs, rubs or bruits.  Abdomen:  Soft with good bowel sounds.  There is no palpable abdominal mass.  There is no fluid wave. There is no palpable hepatosplenomegaly.  Breasts:  Exam shows right mastectomy.  She had an implant which is intact.  There is no right axillary adenopathy.  The left breast shows reduction.  I really cannot feel the implant in the left breast.  No obvious mass, swelling or erythema in the left breast.  There is no left axillary adenopathy. Back:  Exam shows no tenderness  over the spine, ribs or hips.  There is no kyphosis or osteoporotic changes.  Extremities:  Show no clubbing, cyanosis or edema.  Neurological:  Exam shows no focal neurological deficits.  LABORATORY STUDIES:  Were not done this visit.  IMPRESSION:  Caitlin Greene is a 46 year old premenopausal white female with a history of breast cancer at age 46 years old.  Thankfully, she is BRCA negative.  I think that she is cured from that breast cancer.  I suppose she might be at risk for a second breast cancer with the left breast.  I do believe that we should follow up with some lab work on her.  I want to make sure that when we see her back next year that we take care of this.  I do not see that we need to do any kind of x-ray tests on her.  I will see her back in 1 year.    ______________________________ Josph Macho, M.D. PRE/MEDQ  D:  08/29/2011  T:  08/29/2011  Job:  [id number]

## 2011-09-05 ENCOUNTER — Ambulatory Visit: Payer: Managed Care, Other (non HMO) | Admitting: Hematology & Oncology

## 2011-10-01 ENCOUNTER — Ambulatory Visit (INDEPENDENT_AMBULATORY_CARE_PROVIDER_SITE_OTHER): Payer: Managed Care, Other (non HMO) | Admitting: Family Medicine

## 2011-10-01 ENCOUNTER — Encounter: Payer: Self-pay | Admitting: Family Medicine

## 2011-10-01 VITALS — BP 116/80 | HR 78 | Temp 97.5°F | Wt 190.1 lb

## 2011-10-01 DIAGNOSIS — J019 Acute sinusitis, unspecified: Secondary | ICD-10-CM

## 2011-10-01 DIAGNOSIS — B9689 Other specified bacterial agents as the cause of diseases classified elsewhere: Secondary | ICD-10-CM

## 2011-10-01 DIAGNOSIS — H6980 Other specified disorders of Eustachian tube, unspecified ear: Secondary | ICD-10-CM

## 2011-10-01 MED ORDER — LEVOFLOXACIN 500 MG PO TABS: 500.0000 mg | ORAL_TABLET | Freq: Every day | ORAL | Status: AC

## 2011-10-01 NOTE — Patient Instructions (Signed)
Take the levaquin as directed for sinus infection Continue flonase daily  mucinex is fine  Drink lots of fluids Update if not starting to improve in a week or if worsening

## 2011-10-01 NOTE — Progress Notes (Signed)
Subjective:    Patient ID: Caitlin Greene, female    DOB: 04/05/1966, 46 y.o.   MRN: [id number]  HPI Thinks she has a sinus infection -- 1 month of symptoms  Started with congestion - used netti pot - got a little better and then worse  Achey in face -- green nasal discharge  R side of face hurts more   Felt feverish  No chills   No cough   R ear feels clogged - hard to hear well with it  Feels like full of water No drainage No ear pain   Throat ok   Patient Active Problem List  Diagnoses  . HYPERLIPIDEMIA  . ANEMIA-IRON DEFICIENCY  . ANXIETY  . DEPRESSION  . Viral labyrinthitis  . ALLERGIC RHINITIS  . GERD  . RENAL CALCULUS  . Wheezing  . BREAST CANCER, HX OF  . HEMATOCHEZIA  . Preventative health care  . Acute sinus infection  . Acute bacterial sinusitis  . ETD (eustachian tube dysfunction)   Past Medical History  Diagnosis Date  . BREAST CANCER, HX OF 01/21/2007    at 46yo  . ANXIETY 01/21/2007  . DEPRESSION 01/21/2007  . HYPERLIPIDEMIA 01/21/2007  . ALLERGIC RHINITIS 01/21/2007  . GERD 10/11/2007  . RENAL CALCULUS 01/21/2007  . ANEMIA-IRON DEFICIENCY 10/11/2007  . Kidney stones   . Cancer    Past Surgical History  Procedure Date  . Mastectomy     right  . Tonsillectomy   . Breast enhancement surgery   . Temporomandibular joint surgery     Left   History  Substance Use Topics  . Smoking status: Never Smoker   . Smokeless tobacco: Never Used  . Alcohol Use: No   Family History  Problem Relation Age of Onset  . Colon polyps Mother   . Cancer Mother     Uterine Cancer  . Colon polyps Father   . Hypertension Other   . Diabetes Other    Allergies  Allergen Reactions  . Amoxicillin-Pot Clavulanate   . Codeine   . Penicillins    Current Outpatient Prescriptions on File Prior to Visit  Medication Sig Dispense Refill  . cetirizine (ZYRTEC) 10 MG tablet Take 10 mg by mouth daily.        Marland Kitchen FLUoxetine (PROZAC) 20 MG capsule TAKE 1 CAPSULE (20 MG  TOTAL) BY MOUTH DAILY.  90 capsule  1  . fluticasone (FLONASE) 50 MCG/ACT nasal spray Place 2 sprays into the nose daily.        Marland Kitchen guaiFENesin (MUCINEX) 600 MG 12 hr tablet Take 1,200 mg by mouth 2 (two) times daily.        . phenazopyridine (PYRIDIUM) 200 MG tablet every morning.      . sulfamethoxazole-trimethoprim (BACTRIM DS) 800-160 MG per tablet every morning.          Review of Systems Review of Systems  Constitutional: Negative for fever, appetite change, and unexpected weight change.  ENT pos for ear pressure worse in R and also congestion/ facial pain  Eyes: Negative for pain and visual disturbance.  Respiratory: Negative for cough and shortness of breath.   Cardiovascular: Negative for cp or palpitations    Gastrointestinal: Negative for nausea, diarrhea and constipation.  Genitourinary: Negative for urgency and frequency.  Skin: Negative for pallor or rash   Neurological: Negative for weakness, light-headedness, numbness and headaches.  Hematological: Negative for adenopathy. Does not bruise/bleed easily.  Psychiatric/Behavioral: Negative for dysphoric mood. The patient is not nervous/anxious.  Objective:   Physical Exam  Constitutional: She appears well-developed and well-nourished. No distress.  HENT:  Head: Normocephalic and atraumatic.  Mouth/Throat: Oropharynx is clear and moist. No oropharyngeal exudate.       Nares are injected and congested  R sided maxillary sinus tenderness R TM is dull and effusion present - no redness Throat clear   Eyes: Conjunctivae and EOM are normal. Pupils are equal, round, and reactive to light. Right eye exhibits no discharge. Left eye exhibits no discharge.  Neck: Normal range of motion. Neck supple. No thyromegaly present.  Cardiovascular: Normal rate, regular rhythm and normal heart sounds.   Pulmonary/Chest: Effort normal and breath sounds normal. No respiratory distress. She has no wheezes. She has no rales.    Musculoskeletal: She exhibits no edema.  Lymphadenopathy:    She has no cervical adenopathy.  Neurological: She is alert. No cranial nerve deficit.  Skin: Skin is warm and dry. No rash noted. No erythema. No pallor.  Psychiatric: She has a normal mood and affect.          Assessment & Plan:

## 2011-10-01 NOTE — Assessment & Plan Note (Signed)
Will cover with levaquin since pt is pcn all  Disc symptomatic care - see instructions on AVS  Update if not starting to improve in a week or if worsening

## 2011-10-01 NOTE — Assessment & Plan Note (Signed)
With R side worse -enc to continue flonase daily to open up sinus system If ear pain or not in 1-2 wk will update

## 2011-12-09 ENCOUNTER — Ambulatory Visit (INDEPENDENT_AMBULATORY_CARE_PROVIDER_SITE_OTHER): Payer: Managed Care, Other (non HMO) | Admitting: Internal Medicine

## 2011-12-09 ENCOUNTER — Encounter: Payer: Self-pay | Admitting: Internal Medicine

## 2011-12-09 VITALS — BP 100/70 | HR 81 | Temp 97.2°F | Ht 68.0 in | Wt 194.1 lb

## 2011-12-09 DIAGNOSIS — N390 Urinary tract infection, site not specified: Secondary | ICD-10-CM

## 2011-12-09 DIAGNOSIS — J0100 Acute maxillary sinusitis, unspecified: Secondary | ICD-10-CM

## 2011-12-09 DIAGNOSIS — B373 Candidiasis of vulva and vagina: Secondary | ICD-10-CM

## 2011-12-09 MED ORDER — FLUTICASONE PROPIONATE 50 MCG/ACT NA SUSP: 2.0000 | Freq: Every day | NASAL | Status: DC

## 2011-12-09 MED ORDER — FLUCONAZOLE 150 MG PO TABS: 150.0000 mg | ORAL_TABLET | Freq: Once | ORAL | Status: AC

## 2011-12-09 MED ORDER — LEVOFLOXACIN 500 MG PO TABS: 500.0000 mg | ORAL_TABLET | Freq: Every day | ORAL | Status: AC

## 2011-12-09 NOTE — Patient Instructions (Signed)
It was good to see you today. Levaquin antibiotics daily x 10 days for sinus and bladder infection symptoms - Continue Flonase and use diflucan for yeast symptoms as needed Your prescription(s) have been submitted to your pharmacy. Please take as directed and contact our office if you believe you are having problem(s) with the medication(s). Refill on medication(s) as discussed today.

## 2011-12-09 NOTE — Progress Notes (Signed)
  Subjective:    Patient ID: Caitlin Greene, female    DOB: 06-12-1965, 46 y.o.   MRN: [id number]  HPI  See CC and ROS  Past Medical History  Diagnosis Date  . BREAST CANCER, HX OF 01/21/2007    at 46yo  . ANXIETY   . DEPRESSION   . HYPERLIPIDEMIA   . ALLERGIC RHINITIS   . GERD   . RENAL CALCULUS   . ANEMIA-IRON DEFICIENCY     Review of Systems     Objective:   Physical Exam BP 100/70  Pulse 81  Temp 97.2 F (36.2 C) (Oral)  Ht 5\' 8"  (1.727 m)  Wt 194 lb 2 oz (88.055 kg)  BMI 29.52 kg/m2  SpO2 97% Constitutional: She appears well-developed and well-nourished. No distress.  HENT: Head: Normocephalic and atraumatic sinus tenderness to palpation. Ears: B TMs ok, no erythema or effusion; Nose: Nose normal. Mouth/Throat: Oropharynx is clear and moist. No oropharyngeal exudate.  Eyes: Conjunctivae and EOM are normal. Pupils are equal, round, and reactive to light. No scleral icterus.  Neck: Normal range of motion. Neck supple. No JVD present. No thyromegaly present.  Cardiovascular: Normal rate, regular rhythm and normal heart sounds.  No murmur heard. No BLE edema. Pulmonary/Chest: Effort normal and breath sounds normal. No respiratory distress. She has no wheezes.  Abdominal: Soft. Bowel sounds are normal. She exhibits no distension. There is no tenderness. no masses  Lab Results  Component Value Date   WBC 8.8 11/18/2010   HGB 13.9 11/18/2010   HCT 40.3 11/18/2010   PLT 228.0 11/18/2010   GLUCOSE 95 11/18/2010   CHOL 195 05/24/2010   TRIG 141.0 05/24/2010   HDL 40.00 05/24/2010   LDLCALC 127* 05/24/2010   ALT 16 05/24/2010   AST 16 05/24/2010   NA 138 11/18/2010   K 4.1 11/18/2010   CL 103 11/18/2010   CREATININE 0.8 11/18/2010   BUN 13 11/18/2010   CO2 24 11/18/2010   TSH 4.01 05/24/2010       Assessment & Plan:  Acute R maxillary sinusitis - hx same/recurrent with allergic rhinitis - Levaquin and decongestants advised, continue Flonase  Dysuria - hx consistent with UTI,  on menses so will skip UA and tx empirically - will be covered by antibiotics tx Levaquin  Diflucan for anticipated candidosis due to antibiotics

## 2012-01-25 ENCOUNTER — Ambulatory Visit (INDEPENDENT_AMBULATORY_CARE_PROVIDER_SITE_OTHER): Payer: Managed Care, Other (non HMO) | Admitting: Internal Medicine

## 2012-01-25 ENCOUNTER — Encounter (HOSPITAL_COMMUNITY): Payer: Self-pay | Admitting: Emergency Medicine

## 2012-01-25 ENCOUNTER — Encounter: Payer: Self-pay | Admitting: Internal Medicine

## 2012-01-25 ENCOUNTER — Emergency Department (HOSPITAL_COMMUNITY)
Admission: EM | Admit: 2012-01-25 | Discharge: 2012-01-25 | Disposition: A | Discharge status: Home / Self Care | Payer: Managed Care, Other (non HMO) | Attending: Emergency Medicine | Admitting: Emergency Medicine

## 2012-01-25 VITALS — BP 108/72 | HR 87 | Temp 97.5°F | Ht 68.0 in | Wt 193.2 lb

## 2012-01-25 DIAGNOSIS — Z8049 Family history of malignant neoplasm of other genital organs: Secondary | ICD-10-CM | POA: Insufficient documentation

## 2012-01-25 DIAGNOSIS — F329 Major depressive disorder, single episode, unspecified: Secondary | ICD-10-CM | POA: Insufficient documentation

## 2012-01-25 DIAGNOSIS — F3289 Other specified depressive episodes: Secondary | ICD-10-CM | POA: Insufficient documentation

## 2012-01-25 DIAGNOSIS — K219 Gastro-esophageal reflux disease without esophagitis: Secondary | ICD-10-CM | POA: Insufficient documentation

## 2012-01-25 DIAGNOSIS — Z881 Allergy status to other antibiotic agents status: Secondary | ICD-10-CM | POA: Insufficient documentation

## 2012-01-25 DIAGNOSIS — Z8249 Family history of ischemic heart disease and other diseases of the circulatory system: Secondary | ICD-10-CM | POA: Insufficient documentation

## 2012-01-25 DIAGNOSIS — F411 Generalized anxiety disorder: Secondary | ICD-10-CM | POA: Insufficient documentation

## 2012-01-25 DIAGNOSIS — Z833 Family history of diabetes mellitus: Secondary | ICD-10-CM | POA: Insufficient documentation

## 2012-01-25 DIAGNOSIS — R42 Dizziness and giddiness: Secondary | ICD-10-CM | POA: Insufficient documentation

## 2012-01-25 DIAGNOSIS — Z885 Allergy status to narcotic agent status: Secondary | ICD-10-CM | POA: Insufficient documentation

## 2012-01-25 DIAGNOSIS — Z8371 Family history of colonic polyps: Secondary | ICD-10-CM | POA: Insufficient documentation

## 2012-01-25 DIAGNOSIS — H6980 Other specified disorders of Eustachian tube, unspecified ear: Secondary | ICD-10-CM

## 2012-01-25 DIAGNOSIS — H6120 Impacted cerumen, unspecified ear: Secondary | ICD-10-CM

## 2012-01-25 DIAGNOSIS — Z853 Personal history of malignant neoplasm of breast: Secondary | ICD-10-CM | POA: Insufficient documentation

## 2012-01-25 DIAGNOSIS — Z83719 Family history of colon polyps, unspecified: Secondary | ICD-10-CM | POA: Insufficient documentation

## 2012-01-25 DIAGNOSIS — J329 Chronic sinusitis, unspecified: Secondary | ICD-10-CM | POA: Insufficient documentation

## 2012-01-25 MED ORDER — DOCUSATE SODIUM 50 MG/5ML PO LIQD
4.0000 mg | Freq: Once | ORAL | Status: AC
  Administered 2012-01-25: 4 mg via ORAL
  Filled 2012-01-25: qty 10

## 2012-01-25 MED ORDER — ONDANSETRON HCL 4 MG/2ML IJ SOLN
INTRAMUSCULAR | Status: AC
  Administered 2012-01-25: 02:00:00
  Filled 2012-01-25: qty 2

## 2012-01-25 MED ORDER — MECLIZINE HCL 25 MG PO TABS
50.0000 mg | ORAL_TABLET | Freq: Once | ORAL | Status: AC
  Administered 2012-01-25: 50 mg via ORAL
  Filled 2012-01-25: qty 2

## 2012-01-25 MED ORDER — MECLIZINE HCL 50 MG PO TABS: 25.0000 mg | ORAL_TABLET | Freq: Three times a day (TID) | ORAL | Status: DC | PRN

## 2012-01-25 NOTE — Progress Notes (Signed)
Subjective:    Patient ID: Caitlin Greene, female    DOB: [birth year], 46 y.o.   MRN: [id number]   HPI  Here for f/u with husband after being seen in ER recently with right cerumen impaction and vertigo;  tx with right canal irriagation and meclizine prn in addition to prior meds;  Denies fever but has had increased pain to sinus areas, with congestion mostly clear with bilat ear fullness and vertigo;  Still has muffling of the right ear hearing but positional vertigo has somewhat improved since being seen in ER.  Pt denies chest pain, increased sob or doe, wheezing, orthopnea, PND, increased LE swelling, palpitations, or syncope. Pt denies new neurological symptoms such as new headache, or facial or extremity weakness or numbness except for the above, and  Pt denies polydipsia, polyuria. Overall good compliance with treatment, and good medicine tolerability, but admits not taking the flonase on a regular basis.   Past Medical History  Diagnosis Date  . BREAST CANCER, HX OF 01/21/2007    at 46yo  . ANXIETY   . DEPRESSION   . HYPERLIPIDEMIA   . ALLERGIC RHINITIS   . GERD   . RENAL CALCULUS   . ANEMIA-IRON DEFICIENCY    Past Surgical History  Procedure Date  . Mastectomy     right  . Tonsillectomy   . Breast enhancement surgery   . Temporomandibular joint surgery     Left    reports that she has never smoked. She has never used smokeless tobacco. She reports that she does not drink alcohol or use illicit drugs. family history includes Cancer in her mother; Colon polyps in her father and mother; Diabetes in her other; and Hypertension in her other. Allergies  Allergen Reactions  . Amoxicillin-Pot Clavulanate Hives  . Codeine Nausea And Vomiting    Needs pre-meds   Current Outpatient Prescriptions on File Prior to Visit  Medication Sig Dispense Refill  . cetirizine (ZYRTEC) 10 MG tablet Take 10 mg by mouth every evening.       . fluticasone (FLONASE) 50 MCG/ACT nasal spray Place 2  sprays into the nose daily.  16 g  11  . DISCONTD: FLUoxetine (PROZAC) 20 MG capsule TAKE 1 CAPSULE (20 MG TOTAL) BY MOUTH DAILY.  90 capsule  1   No current facility-administered medications on file prior to visit.   Review of Systems  Constitutional: Negative for diaphoresis and unexpected weight change.  HENT: Negative for tinnitus.   Eyes: Negative for photophobia and visual disturbance.  Respiratory: Negative for choking and stridor.   Gastrointestinal: Negative for vomiting and blood in stool.  Genitourinary: Negative for hematuria and decreased urine volume.  Musculoskeletal: Negative for gait problem.  Neurological: Negative for tremors and numbness.  Psychiatric/Behavioral: Negative for decreased concentration. The patient is not hyperactive.      Objective:   Physical Exam BP 108/72  Pulse 87  Temp 97.5 F (36.4 C) (Oral)  Ht 5\' 8"  (1.727 m)  Wt 193 lb 4 oz (87.658 kg)  BMI 29.38 kg/m2  SpO2 98% Physical Exam  VS noted Constitutional: Pt appears well-developed and well-nourished.  HENT: Head: Normocephalic.  Right Ear: External ear normal.  Left Ear: External ear normal.  Bilat tm's mild erythema.  Sinus nontender.  Pharynx mild erythema Eyes: Conjunctivae and EOM are normal. Pupils are equal, round, and reactive to light.  Neck: Normal range of motion. Neck supple.  Cardiovascular: Normal rate and regular rhythm.   Pulmonary/Chest: Effort normal and breath  sounds normal.  Neurological: Pt is alert. Not confused  Skin: Skin is warm. No erythema.  Psychiatric: Pt behavior is normal. Thought content normal. not overly nervous or depressed affect    Assessment & Plan:

## 2012-01-25 NOTE — Assessment & Plan Note (Signed)
Also for mucinex D bid prn,  to f/u any worsening symptoms or concerns j

## 2012-01-25 NOTE — Assessment & Plan Note (Signed)
To re-start the flonase asd, cont zyrtec (also gave samples nasonex), also for CT sinus and ENT referral per pt request

## 2012-01-25 NOTE — ED Provider Notes (Signed)
History     CSN: [id number]  Arrival date & time 01/25/12  0157   First MD Initiated Contact with Patient 01/25/12 [phone]      Chief Complaint  Patient presents with  . Dizziness  . Emesis  . Otitis Media    (Consider location/radiation/quality/duration/timing/severity/associated sxs/prior treatment) HPI Comments: Patient reports, that for the last 2, weeks.  Her right ear has felt stuffed.  Tonight, worse with increased dizziness and nausea.  She does have a history of vertigo and feels, that this is a repeat of the denies any URI symptoms, nasal congestion, rhinitis, sore throat, myalgias, or fevers  Patient is a 46 y.o. female presenting with vomiting. The history is provided by the patient.  Emesis  This is a recurrent problem. The current episode started 12 to 24 hours ago. The problem occurs continuously. The problem has been gradually worsening. There has been no fever. Pertinent negatives include no chills, no fever and no headaches.    Past Medical History  Diagnosis Date  . BREAST CANCER, HX OF 01/21/2007    at 46yo  . ANXIETY   . DEPRESSION   . HYPERLIPIDEMIA   . ALLERGIC RHINITIS   . GERD   . RENAL CALCULUS   . ANEMIA-IRON DEFICIENCY     Past Surgical History  Procedure Date  . Mastectomy     right  . Tonsillectomy   . Breast enhancement surgery   . Temporomandibular joint surgery     Left    Family History  Problem Relation Age of Onset  . Colon polyps Mother   . Cancer Mother     Uterine Cancer  . Colon polyps Father   . Hypertension Other   . Diabetes Other     History  Substance Use Topics  . Smoking status: Never Smoker   . Smokeless tobacco: Never Used  . Alcohol Use: No    OB History    Grav Para Term Preterm Abortions TAB SAB Ect Mult Living                  Review of Systems  Constitutional: Negative for fever and chills.  HENT: Positive for hearing loss and ear pain. Negative for congestion, rhinorrhea, neck pain, neck  stiffness and ear discharge.   Gastrointestinal: Positive for nausea. Negative for vomiting.  Skin: Negative for rash and wound.  Neurological: Positive for dizziness. Negative for headaches.    Allergies  Amoxicillin-pot clavulanate and Codeine  Home Medications   Current Outpatient Rx  Name Route Sig Dispense Refill  . CETIRIZINE HCL 10 MG PO TABS Oral Take 10 mg by mouth every evening.     Marland Kitchen FLUOXETINE HCL 20 MG PO CAPS      . FLUTICASONE PROPIONATE 50 MCG/ACT NA SUSP Nasal Place 2 sprays into the nose daily. 16 g 11  . NAPROXEN SODIUM 220 MG PO TABS Oral Take 220 mg by mouth 2 (two) times daily as needed. For pain    . PSEUDOEPHEDRINE-GUAIFENESIN ER 60-600 MG PO TB12 Oral Take 1 tablet by mouth 2 (two) times daily as needed. For congestion    . MECLIZINE HCL 50 MG PO TABS Oral Take 0.5 tablets (25 mg total) by mouth 3 (three) times daily as needed. 30 tablet 0    BP 114/79  Pulse 76  Temp 98.1 F (36.7 C) (Oral)  Resp 18  SpO2 100%  Physical Exam  Constitutional: She is oriented to person, place, and time. She appears well-developed and well-nourished.  HENT:  Head: Normocephalic.  Right Ear: No drainage, swelling or tenderness. Decreased hearing is noted.       Cerumen impaction, right ear  Eyes: Pupils are equal, round, and reactive to light.  Neck: Normal range of motion.  Cardiovascular: Normal rate.   Pulmonary/Chest: Effort normal.  Musculoskeletal: Normal range of motion.  Neurological: She is alert and oriented to person, place, and time.  Skin: Skin is warm. No rash noted.    ED Course  Procedures (including critical care time)  Labs Reviewed - No data to display No results found.   1. Cerumen impaction   2. Vertigo       MDM  After patient received 25 mg of meclizine by mouth and had right ear flushed, with a large, "chunk" of wax removed.  Patient is less dizzy, nausea has, abated.  We'll discharge home with meclizine followup with her  PCP         Arman Filter, NP 01/25/12 0406  Arman Filter, NP 01/25/12 0406  Arman Filter, NP 01/25/12 1610

## 2012-01-25 NOTE — ED Notes (Signed)
Brought in by EMS from home with c/o "ringing" in right ear with dizziness and nausea. Per EMS, pt has been having dizziness and ringing in her right ear for 2 weeks now but now they are getting worse that she started having nausea and vomiting tonight.  EMS further reported that pt has had hx of right ear infection, pt reported to EMS that she has "crystal form" in her inner ear and infection of the nerve.  Pt reports that she hears " dial-tone ringing, muffled sound" in her right ear.

## 2012-01-25 NOTE — Assessment & Plan Note (Signed)
Ok to cont meclizine prn,  to f/u any worsening symptoms or concerns

## 2012-01-25 NOTE — ED Provider Notes (Signed)
Medical screening examination/treatment/procedure(s) were performed by non-physician practitioner and as supervising physician I was immediately available for consultation/collaboration.   Urie Loughner, MD 01/25/12 0717 

## 2012-01-25 NOTE — Patient Instructions (Addendum)
Continue all other medications as before Please have the pharmacy call with any refills you may need. You will be contacted regarding the referral for: CT sinus, and ENT

## 2012-01-25 NOTE — ED Notes (Signed)
Bed:WA07<BR> Expected date:<BR> Expected time:<BR> Means of arrival:<BR> Comments:<BR> EMS

## 2012-01-31 ENCOUNTER — Other Ambulatory Visit: Payer: Managed Care, Other (non HMO)

## 2012-01-31 ENCOUNTER — Other Ambulatory Visit: Payer: Self-pay | Admitting: Internal Medicine

## 2012-02-01 ENCOUNTER — Ambulatory Visit (INDEPENDENT_AMBULATORY_CARE_PROVIDER_SITE_OTHER)
Admission: RE | Admit: 2012-02-01 | Discharge: 2012-02-01 | Disposition: A | Discharge status: Home / Self Care | Payer: Managed Care, Other (non HMO) | Source: Ambulatory Visit | Attending: Internal Medicine | Admitting: Internal Medicine

## 2012-02-01 ENCOUNTER — Encounter: Payer: Self-pay | Admitting: Internal Medicine

## 2012-02-01 DIAGNOSIS — J329 Chronic sinusitis, unspecified: Secondary | ICD-10-CM

## 2012-02-02 ENCOUNTER — Telehealth: Payer: Self-pay | Admitting: *Deleted

## 2012-02-02 NOTE — Telephone Encounter (Signed)
Pt called requesting results of CT scan, pt informed via VM as requested.

## 2012-04-10 ENCOUNTER — Encounter: Payer: Self-pay | Admitting: Internal Medicine

## 2012-04-10 ENCOUNTER — Ambulatory Visit (INDEPENDENT_AMBULATORY_CARE_PROVIDER_SITE_OTHER): Payer: Managed Care, Other (non HMO) | Admitting: Internal Medicine

## 2012-04-10 ENCOUNTER — Other Ambulatory Visit (INDEPENDENT_AMBULATORY_CARE_PROVIDER_SITE_OTHER): Payer: Managed Care, Other (non HMO)

## 2012-04-10 VITALS — BP 102/70 | HR 83 | Temp 98.1°F | Ht 68.0 in | Wt 192.0 lb

## 2012-04-10 DIAGNOSIS — Z0000 Encounter for general adult medical examination without abnormal findings: Secondary | ICD-10-CM

## 2012-04-10 DIAGNOSIS — J329 Chronic sinusitis, unspecified: Secondary | ICD-10-CM

## 2012-04-10 LAB — HEPATIC FUNCTION PANEL
ALT: 17 U/L (ref 0–35)
AST: 15 U/L (ref 0–37)
Albumin: 4.3 g/dL (ref 3.5–5.2)
Alkaline Phosphatase: 50 U/L (ref 39–117)
Bilirubin, Direct: 0.1 mg/dL (ref 0.0–0.3)
Total Bilirubin: 0.5 mg/dL (ref 0.3–1.2)
Total Protein: 7.3 g/dL (ref 6.0–8.3)

## 2012-04-10 LAB — URINALYSIS, ROUTINE W REFLEX MICROSCOPIC
Bilirubin Urine: NEGATIVE
Hgb urine dipstick: NEGATIVE
Ketones, ur: NEGATIVE
Nitrite: NEGATIVE
Specific Gravity, Urine: 1.025 (ref 1.000–1.030)
Total Protein, Urine: NEGATIVE
Urine Glucose: NEGATIVE
Urobilinogen, UA: 0.2 (ref 0.0–1.0)
pH: 6.5 (ref 5.0–8.0)

## 2012-04-10 LAB — LIPID PANEL
Cholesterol: 200 mg/dL (ref 0–200)
HDL: 36.7 mg/dL — ABNORMAL LOW (ref >39.00)
LDL Cholesterol: 142 mg/dL — ABNORMAL HIGH (ref 0–99)
Total CHOL/HDL Ratio: 5
Triglycerides: 105 mg/dL (ref 0.0–149.0)
VLDL: 21 mg/dL (ref 0.0–40.0)

## 2012-04-10 LAB — CBC WITH DIFFERENTIAL/PLATELET
Basophils Absolute: 0 10*3/uL (ref 0.0–0.1)
Basophils Relative: 0.5 % (ref 0.0–3.0)
Eosinophils Absolute: 0.1 10*3/uL (ref 0.0–0.7)
Eosinophils Relative: 0.8 % (ref 0.0–5.0)
HCT: 43 % (ref 36.0–46.0)
Hemoglobin: 14.5 g/dL (ref 12.0–15.0)
Lymphocytes Relative: 19.8 % (ref 12.0–46.0)
Lymphs Abs: 1.5 10*3/uL (ref 0.7–4.0)
MCHC: 33.8 g/dL (ref 30.0–36.0)
MCV: 93.8 fl (ref 78.0–100.0)
Monocytes Absolute: 0.6 10*3/uL (ref 0.1–1.0)
Monocytes Relative: 7.9 % (ref 3.0–12.0)
Neutro Abs: 5.3 10*3/uL (ref 1.4–7.7)
Neutrophils Relative %: 71 % (ref 43.0–77.0)
Platelets: 229 10*3/uL (ref 150.0–400.0)
RBC: 4.58 Mil/uL (ref 3.87–5.11)
RDW: 13.3 % (ref 11.5–14.6)
WBC: 7.4 10*3/uL (ref 4.5–10.5)

## 2012-04-10 LAB — BASIC METABOLIC PANEL
BUN: 15 mg/dL (ref 6–23)
CO2: 25 mEq/L (ref 19–32)
Calcium: 9 mg/dL (ref 8.4–10.5)
Chloride: 107 mEq/L (ref 96–112)
Creatinine, Ser: 0.9 mg/dL (ref 0.4–1.2)
GFR: 74.77 mL/min (ref >60.00)
Glucose, Bld: 99 mg/dL (ref 70–99)
Potassium: 4.2 mEq/L (ref 3.5–5.1)
Sodium: 138 mEq/L (ref 135–145)

## 2012-04-10 LAB — TSH: TSH: 3.23 u[IU]/mL (ref 0.35–5.50)

## 2012-04-10 MED ORDER — LEVOFLOXACIN 250 MG PO TABS: 250.0000 mg | ORAL_TABLET | Freq: Every day | ORAL | Status: DC

## 2012-04-10 NOTE — Progress Notes (Signed)
Subjective:    Patient ID: Caitlin Greene, female    DOB: 1965/10/06, 46 y.o.   MRN: [id number]  HPI  Here for wellness and f/u;  Overall doing ok;  Pt denies CP, worsening SOB, DOE, wheezing, orthopnea, PND, worsening LE edema, palpitations, dizziness or syncope.  Pt denies neurological change such as new Headache, facial or extremity weakness.  Pt denies polydipsia, polyuria, or low sugar symptoms. Pt states overall good compliance with treatment and medications, good tolerability, and trying to follow lower cholesterol diet.  Pt denies worsening depressive symptoms, suicidal ideation or panic. No fever, wt loss, night sweats, loss of appetite, or other constitutional symptoms.  Pt states good ability with ADL's, low fall risk, home safety reviewed and adequate, no significant changes in hearing or vision, and occasionally active with exercise.  Does have acute 3 days right facial pain with greenish d/c Past Medical History  Diagnosis Date  . BREAST CANCER, HX OF 01/21/2007    at 46yo  . ANXIETY   . DEPRESSION   . HYPERLIPIDEMIA   . ALLERGIC RHINITIS   . GERD   . RENAL CALCULUS   . ANEMIA-IRON DEFICIENCY    Past Surgical History  Procedure Date  . Mastectomy     right  . Tonsillectomy   . Breast enhancement surgery   . Temporomandibular joint surgery     Left    reports that she has never smoked. She has never used smokeless tobacco. She reports that she does not drink alcohol or use illicit drugs. family history includes Cancer in her mother; Colon polyps in her father and mother; Diabetes in her other; and Hypertension in her other. Allergies  Allergen Reactions  . Amoxicillin-Pot Clavulanate Hives  . Codeine Nausea And Vomiting    Needs pre-meds   Current Outpatient Prescriptions on File Prior to Visit  Medication Sig Dispense Refill  . cetirizine (ZYRTEC) 10 MG tablet Take 10 mg by mouth every evening.       Marland Kitchen FLUoxetine (PROZAC) 20 MG capsule TAKE ONE CAPSULE BY MOUTH DAILY   90 capsule  1  . fluticasone (FLONASE) 50 MCG/ACT nasal spray Place 2 sprays into the nose daily.  16 g  11  . meclizine (ANTIVERT) 50 MG tablet Take 0.5 tablets (25 mg total) by mouth 3 (three) times daily as needed.  30 tablet  0  . naproxen sodium (ANAPROX) 220 MG tablet Take 220 mg by mouth 2 (two) times daily as needed. For pain      . pseudoephedrine-guaifenesin (MUCINEX D) 60-600 MG per tablet Take 1 tablet by mouth 2 (two) times daily as needed. For congestion       Review of Systems Review of Systems  Constitutional: Negative for diaphoresis, activity change, appetite change and unexpected weight change.  HENT: Negative for hearing loss, ear pain, facial swelling, mouth sores and neck stiffness.   Eyes: Negative for pain, redness and visual disturbance.  Respiratory: Negative for shortness of breath and wheezing.   Cardiovascular: Negative for chest pain and palpitations.  Gastrointestinal: Negative for diarrhea, blood in stool, abdominal distention and rectal pain.  Genitourinary: Negative for hematuria, flank pain and decreased urine volume.  Musculoskeletal: Negative for myalgias and joint swelling.  Skin: Negative for color change and wound.  Neurological: Negative for syncope and numbness.  Hematological: Negative for adenopathy.  Psychiatric/Behavioral: Negative for hallucinations, self-injury, decreased concentration and agitation.      Objective:   Physical Exam BP 102/70  Pulse 83  Temp 98.1  F (36.7 C) (Oral)  Ht 5\' 8"  (1.727 m)  Wt 192 lb (87.091 kg)  BMI 29.19 kg/m2  SpO2 97% Physical Exam  VS noted Constitutional: Pt is oriented to person, place, and time. Appears well-developed and well-nourished.  Head: Normocephalic and atraumatic.  Right Ear: External ear normal.  Left Ear: External ear normal.  Nose: Nose normal.  Mouth/Throat: Oropharynx is clear and moist.  Eyes: Conjunctivae and EOM are normal. Pupils are equal, round, and reactive to light.   Neck: Normal range of motion. Neck supple. No JVD present. No tracheal deviation present.  Cardiovascular: Normal rate, regular rhythm, normal heart sounds and intact distal pulses.   Pulmonary/Chest: Effort normal and breath sounds normal.  Abdominal: Soft. Bowel sounds are normal. There is no tenderness.  Musculoskeletal: Normal range of motion. Exhibits no edema.  Lymphadenopathy:  Has no cervical adenopathy.  Neurological: Pt is alert and oriented to person, place, and time. Pt has normal reflexes. No cranial nerve deficit.  Skin: Skin is warm and dry. No rash noted.  Psychiatric:  Has  normal mood and affect. Behavior is normal.     Assessment & Plan:

## 2012-04-10 NOTE — Assessment & Plan Note (Signed)
With mild flare - Mild to mod, for antibx course,  to f/u any worsening symptoms or concerns

## 2012-04-10 NOTE — Assessment & Plan Note (Signed)

## 2012-04-10 NOTE — Patient Instructions (Addendum)
Take all new medications as prescribed Continue all other medications as before Please have the pharmacy call with any other refills you may need. You are otherwise up to date with prevention Please go to LAB in the Basement for the blood and/or urine tests to be done today You will be contacted by phone if any changes need to be made immediately.  Otherwise, you will receive a letter about your results with an explanation, but please check with MyChart first. Please remember to followup with your GYN for the yearly pap smear Thank you for enrolling in MyChart. Please follow the instructions below to securely access your online medical record. MyChart allows you to send messages to your doctor, view your test results, renew your prescriptions, schedule appointments, and more. To Log into MyChart, please go to [email or web address], and your Username is: [email or web address] Please return in 1 year for your yearly visit, or sooner if needed, with Lab testing done 3-5 days before

## 2012-06-18 ENCOUNTER — Encounter: Payer: Self-pay | Admitting: Internal Medicine

## 2012-06-18 ENCOUNTER — Ambulatory Visit (INDEPENDENT_AMBULATORY_CARE_PROVIDER_SITE_OTHER): Payer: Managed Care, Other (non HMO) | Admitting: Internal Medicine

## 2012-06-18 VITALS — BP 112/70 | HR 89 | Temp 97.5°F | Ht 68.0 in | Wt 192.2 lb

## 2012-06-18 DIAGNOSIS — F329 Major depressive disorder, single episode, unspecified: Secondary | ICD-10-CM

## 2012-06-18 DIAGNOSIS — J019 Acute sinusitis, unspecified: Secondary | ICD-10-CM | POA: Insufficient documentation

## 2012-06-18 DIAGNOSIS — H6980 Other specified disorders of Eustachian tube, unspecified ear: Secondary | ICD-10-CM

## 2012-06-18 MED ORDER — AZITHROMYCIN 250 MG PO TABS: ORAL_TABLET | ORAL | Status: DC

## 2012-06-18 NOTE — Progress Notes (Signed)
Subjective:     Patient ID: Caitlin Greene, female   DOB: 1965-05-30, 47 y.o.   MRN: [id number]  HPI  Here with 2-3 days acute onset fever, facial pain, pressure, headache, general weakness and malaise, and greenish d/c, with mild ST and cough, but pt denies chest pain, wheezing, increased sob or doe, orthopnea, PND, increased LE swelling, palpitations, dizziness or syncope.   Pt denies polydipsia, polyuria.  Pt denies new neurological symptoms such as new headache, or facial or extremity weakness or numbness.  Has some increased left ear pressure and clicking/popping for several days as well.  Denies worsening depressive symptoms, suicidal ideation, or panic; has ongoing anxiety, not increased recently. Past Medical History  Diagnosis Date  . BREAST CANCER, HX OF 01/21/2007    at 47yo  . ANXIETY   . DEPRESSION   . HYPERLIPIDEMIA   . ALLERGIC RHINITIS   . GERD   . RENAL CALCULUS   . ANEMIA-IRON DEFICIENCY    Past Surgical History  Procedure Laterality Date  . Mastectomy      right  . Tonsillectomy    . Breast enhancement surgery    . Temporomandibular joint surgery      Left    reports that she has never smoked. She has never used smokeless tobacco. She reports that she does not drink alcohol or use illicit drugs. family history includes Cancer in her mother; Colon polyps in her father and mother; Diabetes in her other; and Hypertension in her other. Allergies  Allergen Reactions  . Amoxicillin-Pot Clavulanate Hives  . Codeine Nausea And Vomiting    Needs pre-meds   Current Outpatient Prescriptions on File Prior to Visit  Medication Sig Dispense Refill  . cetirizine (ZYRTEC) 10 MG tablet Take 10 mg by mouth every evening.       Marland Kitchen FLUoxetine (PROZAC) 20 MG capsule TAKE ONE CAPSULE BY MOUTH DAILY  90 capsule  1  . fluticasone (FLONASE) 50 MCG/ACT nasal spray Place 2 sprays into the nose daily.  16 g  11  . naproxen sodium (ANAPROX) 220 MG tablet Take 220 mg by mouth 2 (two) times  daily as needed. For pain      . pseudoephedrine-guaifenesin (MUCINEX D) 60-600 MG per tablet Take 1 tablet by mouth 2 (two) times daily as needed. For congestion       No current facility-administered medications on file prior to visit.   Review of Systems  Constitutional: Negative for unexpected weight change, or unusual diaphoresis  HENT: Negative for tinnitus.   Eyes: Negative for photophobia and visual disturbance.  Respiratory: Negative for choking and stridor.   Gastrointestinal: Negative for vomiting and blood in stool.  Genitourinary: Negative for hematuria and decreased urine volume.  Musculoskeletal: Negative for acute joint swelling Skin: Negative for color change and wound.  Neurological: Negative for tremors and numbness other than noted  Psychiatric/Behavioral: Negative for decreased concentration or  hyperactivity.       Objective:   Physical Exam BP 112/70  Pulse 89  Temp(Src) 97.5 F (36.4 C) (Oral)  Ht 5\' 8"  (1.727 m)  Wt 192 lb 4 oz (87.204 kg)  BMI 29.24 kg/m2  SpO2 97% VS noted,  Constitutional: Pt appears well-developed and well-nourished.  HENT: Head: NCAT.  Right Ear: External ear normal.  Left Ear: External ear normal.  Bilat tm's with mild erythema.  Max sinus areas mild tender.  Pharynx with mild erythema, no exudate Eyes: Conjunctivae and EOM are normal. Pupils are equal, round, and reactive  to light.  Neck: Normal range of motion. Neck supple.  Cardiovascular: Normal rate and regular rhythm.   Pulmonary/Chest: Effort normal and breath sounds normal.  Neurological: Pt is alert. Not confused  Skin: Skin is warm. No erythema.  Psychiatric: Pt behavior is normal. Thought content normal. no depressed affect, mild nervous     Assessment:       Plan:

## 2012-06-18 NOTE — Assessment & Plan Note (Signed)
stable overall by history and exam, recent data reviewed with pt, and pt to continue medical treatment as before,  to f/u any worsening symptoms or concerns Lab Results  Component Value Date   WBC 7.4 04/10/2012   HGB 14.5 04/10/2012   HCT 43.0 04/10/2012   PLT 229.0 04/10/2012   GLUCOSE 99 04/10/2012   CHOL 200 04/10/2012   TRIG 105.0 04/10/2012   HDL 36.70* 04/10/2012   LDLCALC 142* 04/10/2012   ALT 17 04/10/2012   AST 15 04/10/2012   NA 138 04/10/2012   K 4.2 04/10/2012   CL 107 04/10/2012   CREATININE 0.9 04/10/2012   BUN 15 04/10/2012   CO2 25 04/10/2012   TSH 3.23 04/10/2012

## 2012-06-18 NOTE — Patient Instructions (Signed)
Please take all new medication as prescribed - the antibiotic Please continue all other medications as before, and refills have been done if requested.

## 2012-06-18 NOTE — Assessment & Plan Note (Signed)
Mild to mod, for antibx course,  to f/u any worsening symptoms or concerns 

## 2012-06-18 NOTE — Assessment & Plan Note (Signed)
Cont mucinex otc prn,  to f/u any worsening symptoms or concerns

## 2012-08-11 ENCOUNTER — Ambulatory Visit (INDEPENDENT_AMBULATORY_CARE_PROVIDER_SITE_OTHER): Payer: Managed Care, Other (non HMO) | Admitting: Family Medicine

## 2012-08-11 ENCOUNTER — Encounter: Payer: Self-pay | Admitting: Family Medicine

## 2012-08-11 VITALS — BP 98/78 | Temp 98.1°F | Wt 193.0 lb

## 2012-08-11 DIAGNOSIS — R42 Dizziness and giddiness: Secondary | ICD-10-CM

## 2012-08-11 DIAGNOSIS — H8110 Benign paroxysmal vertigo, unspecified ear: Secondary | ICD-10-CM

## 2012-08-11 MED ORDER — MECLIZINE HCL 25 MG PO TABS: 25.0000 mg | ORAL_TABLET | Freq: Three times a day (TID) | ORAL | Status: DC | PRN

## 2012-08-11 NOTE — Patient Instructions (Addendum)
Vertigo Vertigo means you feel like you or your surroundings are moving when they are not. Vertigo can be dangerous if it occurs when you are at work, driving, or performing difficult activities.  CAUSES  Vertigo occurs when there is a conflict of signals sent to your brain from the visual and sensory systems in your body. There are many different causes of vertigo, including:  Infections, especially in the inner ear.  A bad reaction to a drug or misuse of alcohol and medicines.  Withdrawal from drugs or alcohol.  Rapidly changing positions, such as lying down or rolling over in bed.  A migraine headache.  Decreased blood flow to the brain.  Increased pressure in the brain from a head injury, infection, tumor, or bleeding. SYMPTOMS  You may feel as though the world is spinning around or you are falling to the ground. Because your balance is upset, vertigo can cause nausea and vomiting. You may have involuntary eye movements (nystagmus). DIAGNOSIS  Vertigo is usually diagnosed by physical exam. If the cause of your vertigo is unknown, your caregiver may perform imaging tests, such as an MRI scan (magnetic resonance imaging). TREATMENT  Most cases of vertigo resolve on their own, without treatment. Depending on the cause, your caregiver may prescribe certain medicines. If your vertigo is related to body position issues, your caregiver may recommend movements or procedures to correct the problem. In rare cases, if your vertigo is caused by certain inner ear problems, you may need surgery. HOME CARE INSTRUCTIONS   Follow your caregiver's instructions.  Avoid driving.  Avoid operating heavy machinery.  Avoid performing any tasks that would be dangerous to you or others during a vertigo episode.  Tell your caregiver if you notice that certain medicines seem to be causing your vertigo. Some of the medicines used to treat vertigo episodes can actually make them worse in some people. SEEK  IMMEDIATE MEDICAL CARE IF:   Your medicines do not relieve your vertigo or are making it worse.  You develop problems with talking, walking, weakness, or using your arms, hands, or legs.  You develop severe headaches.  Your nausea or vomiting continues or gets worse.  You develop visual changes.  A family member notices behavioral changes.  Your condition gets worse. MAKE SURE YOU:  Understand these instructions.  Will watch your condition.  Will get help right away if you are not doing well or get worse. Document Released: 02/02/2005 Document Revised: 07/18/2011 Document Reviewed: 11/11/2010 [employer] Patient Information 2013 [employer].  

## 2012-08-11 NOTE — Progress Notes (Signed)
Subjective:    Patient ID: Caitlin Greene, female    DOB: September 04, 1965, 47 y.o.   MRN: [id number]  HPI patient is a 47 year old Caucasian female who is in today with her husband complaining of her current vertigo. Symptoms started yesterday with a fullness sensation in her right ear. She's also noted some increased nasal congestion with some itching and nasal discharge mostly clear over the last few days. She does note some minor flushing but denies high-grade fevers or chills. She started noticing some pressure in her ear is in the last 2 days most notable in the right than the left. Has a history of vertigo in the past and had a few meclizine left at home. They did help temporarily but symptoms returned. She's not having any nausea vomiting as a result of her disequilibrium. She denies any headache or other neurologic complaints. No tinnitus or hearing loss. No chest pain palpitations or shortness of breath    Review of Systems  Constitutional: Positive for malaise/fatigue. Negative for fever and chills.  HENT: Positive for congestion. Negative for ear pain.   Eyes: Negative for discharge.  Respiratory: Negative for shortness of breath.   Cardiovascular: Negative for chest pain, palpitations and leg swelling.  Gastrointestinal: Negative for nausea, abdominal pain and diarrhea.  Endocrine: Negative for polydipsia.  Genitourinary: Negative for dysuria.  Musculoskeletal: Negative for falls.  Skin: Negative for rash.  Neurological: Positive for dizziness. Negative for tingling, tremors, sensory change, speech change, focal weakness, seizures, loss of consciousness and headaches.  Psychiatric/Behavioral: Negative for depression and suicidal ideas. The patient is not nervous/anxious and does not have insomnia.    Review of Systems  Constitutional: Positive for malaise/fatigue. Negative for fever and chills.  HENT: Positive for congestion. Negative for ear pain.   Eyes: Negative for discharge.   Respiratory: Negative for shortness of breath.   Cardiovascular: Negative for chest pain, palpitations and leg swelling.  Gastrointestinal: Negative for nausea, abdominal pain and diarrhea.  Genitourinary: Negative for dysuria.  Musculoskeletal: Negative for falls.  Skin: Negative for rash.  Neurological: Positive for dizziness. Negative for tingling, tremors, sensory change, speech change, focal weakness, seizures, loss of consciousness and headaches.  Endo/Heme/Allergies: Negative for polydipsia.  Psychiatric/Behavioral: Negative for depression and suicidal ideas. The patient is not nervous/anxious and does not have insomnia.        Objective:   Physical Exam  Constitutional: She is oriented to person, place, and time. She appears well-developed and well-nourished. No distress.  HENT:  Head: Normocephalic and atraumatic.  Eyes: Conjunctivae are normal.  Neck: Neck supple. No thyromegaly present.  Cardiovascular: Normal rate, regular rhythm and normal heart sounds.   No murmur heard. Pulmonary/Chest: Effort normal and breath sounds normal. No respiratory distress.  Abdominal: Soft. Bowel sounds are normal. She exhibits no distension and no mass. There is no tenderness.  Musculoskeletal: She exhibits no edema.  Lymphadenopathy:    She has no cervical adenopathy.  Neurological: She is alert and oriented to person, place, and time. No cranial nerve deficit. Coordination normal.  No nystagmus  Skin: Skin is warm and dry.  Psychiatric: She has a normal mood and affect. Her behavior is normal.          Assessment & Plan:  Vertigo Meclizine is helpful but she has run out is given a refill. Encouraged increased rest and hydration as she does look clinically dehydrated and she is educated that this may be contributing. Also notes some increased allergies and congestion over past few  days may try Zyrtec twice daily for next 3 days and then back down to daily again. If persists needs to  f/u with PMD or ENT

## 2012-08-11 NOTE — Assessment & Plan Note (Signed)
Meclizine is helpful but she has run out is given a refill. Encouraged increased rest and hydration as she does look clinically dehydrated and she is educated that this may be contributing. Also notes some increased allergies and congestion over past few days may try Zyrtec twice daily for next 3 days and then back down to daily again. If persists needs to f/u with PMD or ENT

## 2012-08-12 ENCOUNTER — Other Ambulatory Visit: Payer: Self-pay | Admitting: Internal Medicine

## 2012-08-27 ENCOUNTER — Other Ambulatory Visit: Payer: Managed Care, Other (non HMO) | Admitting: Lab

## 2012-08-27 ENCOUNTER — Telehealth: Payer: Self-pay | Admitting: Hematology & Oncology

## 2012-08-27 ENCOUNTER — Ambulatory Visit: Payer: Managed Care, Other (non HMO) | Admitting: Hematology & Oncology

## 2012-08-27 NOTE — Telephone Encounter (Signed)
Patient's husband called and cx 08/27/12 apt due to being sick.  He stated she will call back to resch when she is feeling better

## 2013-01-08 ENCOUNTER — Ambulatory Visit (INDEPENDENT_AMBULATORY_CARE_PROVIDER_SITE_OTHER): Payer: Managed Care, Other (non HMO) | Admitting: Internal Medicine

## 2013-01-08 ENCOUNTER — Encounter: Payer: Self-pay | Admitting: Internal Medicine

## 2013-01-08 VITALS — BP 102/70 | HR 78 | Temp 97.8°F | Ht 68.0 in | Wt 186.0 lb

## 2013-01-08 DIAGNOSIS — J309 Allergic rhinitis, unspecified: Secondary | ICD-10-CM

## 2013-01-08 DIAGNOSIS — F329 Major depressive disorder, single episode, unspecified: Secondary | ICD-10-CM

## 2013-01-08 DIAGNOSIS — J019 Acute sinusitis, unspecified: Secondary | ICD-10-CM

## 2013-01-08 MED ORDER — LEVOFLOXACIN 500 MG PO TABS: 500.0000 mg | ORAL_TABLET | Freq: Every day | ORAL | Status: DC

## 2013-01-08 NOTE — Patient Instructions (Signed)
Please take all new medication as prescribed Please continue all other medications as before, and refills have been done if requested. Please have the pharmacy call with any other refills you may need.  Please remember to sign up for My Chart if you have not done so, as this will be important to you in the future with finding out test results, communicating by private email, and scheduling acute appointments online when needed.   

## 2013-01-08 NOTE — Assessment & Plan Note (Signed)
stable overall by history and exam, recent data reviewed with pt, and pt to continue medical treatment as before,  to f/u any worsening symptoms or concerns, also for mucinex otc prn

## 2013-01-08 NOTE — Assessment & Plan Note (Signed)
stable overall by history and exam, and pt to continue medical treatment as before,  to f/u any worsening symptoms or concerns 

## 2013-01-08 NOTE — Assessment & Plan Note (Signed)
Mild to mod, for antibx course,  to f/u any worsening symptoms or concerns 

## 2013-01-08 NOTE — Progress Notes (Signed)
Subjective:    Patient ID: Caitlin Greene, female    DOB: Nov 19, 1965, 47 y.o.   MRN: [id number]  HPI   Here with 2-3 days acute onset fever, facial pain, pressure, headache, general weakness and malaise, and greenish d/c, with mild ST and cough, but pt denies chest pain, wheezing, increased sob or doe, orthopnea, PND, increased LE swelling, palpitations, dizziness or syncope. Does have several wks ongoing nasal allergy symptoms with clearish congestion, itch and sneezing, without fever, pain, ST, cough, swelling or wheezing, but ok with re-starting meds. Denies worsening depressive symptoms, suicidal ideation, or panic  Past Medical History  Diagnosis Date  . BREAST CANCER, HX OF 01/21/2007    at 47yo  . ANXIETY   . DEPRESSION   . HYPERLIPIDEMIA   . ALLERGIC RHINITIS   . GERD   . RENAL CALCULUS   . ANEMIA-IRON DEFICIENCY    Past Surgical History  Procedure Laterality Date  . Mastectomy      right  . Tonsillectomy    . Breast enhancement surgery    . Temporomandibular joint surgery      Left    reports that she has never smoked. She has never used smokeless tobacco. She reports that she does not drink alcohol or use illicit drugs. family history includes Cancer in her mother; Colon polyps in her father and mother; Diabetes in her other; Hypertension in her other. Allergies  Allergen Reactions  . Amoxicillin-Pot Clavulanate Hives  . Codeine Nausea And Vomiting    Needs pre-meds   Current Outpatient Prescriptions on File Prior to Visit  Medication Sig Dispense Refill  . cetirizine (ZYRTEC) 10 MG tablet Take 10 mg by mouth every evening.       Marland Kitchen FLUoxetine (PROZAC) 20 MG capsule TAKE ONE CAPSULE BY MOUTH DAILY  90 capsule  3  . fluticasone (FLONASE) 50 MCG/ACT nasal spray Place 2 sprays into the nose daily.  16 g  11  . meclizine (ANTIVERT) 25 MG tablet Take 1 tablet (25 mg total) by mouth 3 (three) times daily as needed for nausea.  30 tablet  1  . naproxen sodium (ANAPROX) 220 MG  tablet Take 220 mg by mouth 2 (two) times daily as needed. For pain      . pseudoephedrine-guaifenesin (MUCINEX D) 60-600 MG per tablet Take 1 tablet by mouth 2 (two) times daily as needed. For congestion       No current facility-administered medications on file prior to visit.   Review of Systems  Constitutional: Negative for unexpected weight change, or unusual diaphoresis  HENT: Negative for tinnitus.   Eyes: Negative for photophobia and visual disturbance.  Respiratory: Negative for choking and stridor.   Gastrointestinal: Negative for vomiting and blood in stool.  Genitourinary: Negative for hematuria and decreased urine volume.  Musculoskeletal: Negative for acute joint swelling Skin: Negative for color change and wound.  Neurological: Negative for tremors and numbness other than noted  Psychiatric/Behavioral: Negative for decreased concentration or  hyperactivity.       Objective:   Physical Exam BP 102/70  Pulse 78  Temp(Src) 97.8 F (36.6 C) (Oral)  Ht 5\' 8"  (1.727 m)  Wt 186 lb (84.369 kg)  BMI 28.29 kg/m2  SpO2 95% VS noted, mild ill Constitutional: Pt appears well-developed and well-nourished.  HENT: Head: NCAT.  Right Ear: External ear normal.  Left Ear: External ear normal.  Bilat tm's with mild erythema.  Max sinus areas mild to mod tender.  Pharynx with mild erythema, no  exudate Eyes: Conjunctivae and EOM are normal. Pupils are equal, round, and reactive to light.  Neck: Normal range of motion. Neck supple.  Cardiovascular: Normal rate and regular rhythm.   Pulmonary/Chest: Effort normal and breath sounds normal.  Neurological: Pt is alert. Not confused  Skin: Skin is warm. No erythema.  Psychiatric: Pt behavior is normal. Thought content normal. not depressed affect    Assessment & Plan:

## 2013-03-14 ENCOUNTER — Other Ambulatory Visit: Payer: Self-pay

## 2013-03-20 ENCOUNTER — Ambulatory Visit (INDEPENDENT_AMBULATORY_CARE_PROVIDER_SITE_OTHER): Payer: Managed Care, Other (non HMO) | Admitting: Internal Medicine

## 2013-03-20 ENCOUNTER — Encounter: Payer: Self-pay | Admitting: Internal Medicine

## 2013-03-20 VITALS — BP 108/80 | HR 75 | Temp 97.2°F | Ht 69.0 in | Wt 180.4 lb

## 2013-03-20 DIAGNOSIS — H6980 Other specified disorders of Eustachian tube, unspecified ear: Secondary | ICD-10-CM

## 2013-03-20 DIAGNOSIS — J019 Acute sinusitis, unspecified: Secondary | ICD-10-CM

## 2013-03-20 DIAGNOSIS — F411 Generalized anxiety disorder: Secondary | ICD-10-CM

## 2013-03-20 MED ORDER — AZITHROMYCIN 250 MG PO TABS: ORAL_TABLET | ORAL | Status: DC

## 2013-03-20 NOTE — Patient Instructions (Signed)
Please take all new medication as prescribed Please continue all other medications as before, and refills have been done if requested.  Please remember to sign up for My Chart if you have not done so, as this will be important to you in the future with finding out test results, communicating by private email, and scheduling acute appointments online when needed.   

## 2013-03-20 NOTE — Assessment & Plan Note (Signed)
Mild to mod, for antibx course,  to f/u any worsening symptoms or concerns 

## 2013-03-20 NOTE — Progress Notes (Signed)
Subjective:    Patient ID: Caitlin Greene, female    DOB: 1966/04/18, 47 y.o.   MRN: [id number]  HPI   Here with 2-3 days acute onset fever, facial pain, pressure, headache, general weakness and malaise, and greenish d/c, with mild ST and cough, but pt denies chest pain, wheezing, increased sob or doe, orthopnea, PND, increased LE swelling, palpitations, dizziness or syncope. Has seen ENT recently with vertigo, better with Lucious Groves maneuver, now with minor recurrent vertigo with left pressure in the past 2 days as well.  Denies worsening depressive symptoms, suicidal ideation, or panic; Past Medical History  Diagnosis Date  . BREAST CANCER, HX OF 01/21/2007    at 47yo  . ANXIETY   . DEPRESSION   . HYPERLIPIDEMIA   . ALLERGIC RHINITIS   . GERD   . RENAL CALCULUS   . ANEMIA-IRON DEFICIENCY    Past Surgical History  Procedure Laterality Date  . Mastectomy      right  . Tonsillectomy    . Breast enhancement surgery    . Temporomandibular joint surgery      Left    reports that she has never smoked. She has never used smokeless tobacco. She reports that she does not drink alcohol or use illicit drugs. family history includes Cancer in her mother; Colon polyps in her father and mother; Diabetes in her other; Hypertension in her other. Allergies  Allergen Reactions  . Amoxicillin-Pot Clavulanate Hives  . Codeine Nausea And Vomiting    Needs pre-meds   Current Outpatient Prescriptions on File Prior to Visit  Medication Sig Dispense Refill  . cetirizine (ZYRTEC) 10 MG tablet Take 10 mg by mouth every evening.       Marland Kitchen FLUoxetine (PROZAC) 20 MG capsule TAKE ONE CAPSULE BY MOUTH DAILY  90 capsule  3  . fluticasone (FLONASE) 50 MCG/ACT nasal spray Place 2 sprays into the nose daily.  16 g  11  . meclizine (ANTIVERT) 25 MG tablet Take 1 tablet (25 mg total) by mouth 3 (three) times daily as needed for nausea.  30 tablet  1  . naproxen sodium (ANAPROX) 220 MG tablet Take 220 mg by mouth 2  (two) times daily as needed. For pain      . pseudoephedrine-guaifenesin (MUCINEX D) 60-600 MG per tablet Take 1 tablet by mouth 2 (two) times daily as needed. For congestion       No current facility-administered medications on file prior to visit.   Review of Systems  Constitutional: Negative for unexpected weight change, or unusual diaphoresis  HENT: Negative for tinnitus.   Eyes: Negative for photophobia and visual disturbance.  Respiratory: Negative for choking and stridor.   Gastrointestinal: Negative for vomiting and blood in stool.  Genitourinary: Negative for hematuria and decreased urine volume.  Musculoskeletal: Negative for acute joint swelling Skin: Negative for color change and wound.  Neurological: Negative for tremors and numbness other than noted  Psychiatric/Behavioral: Negative for decreased concentration or  hyperactivity.       Objective:   Physical Exam BP 108/80  Pulse 75  Temp(Src) 97.2 F (36.2 C) (Oral)  Ht 5\' 9"  (1.753 m)  Wt 180 lb 6 oz (81.818 kg)  BMI 26.62 kg/m2  SpO2 97% VS noted,  Constitutional: Pt appears well-developed and well-nourished.  HENT: Head: NCAT.  Right Ear: External ear normal.  Left Ear: External ear normal.  Bilat tm's with mild erythema left > right with mild bulging.  Max sinus areas mild tender.  Pharynx  with mild erythema, no exudate Eyes: Conjunctivae and EOM are normal. Pupils are equal, round, and reactive to light.  Neck: Normal range of motion. Neck supple.  Cardiovascular: Normal rate and regular rhythm.   Pulmonary/Chest: Effort normal and breath sounds normal.  - no rales or wheezing Neurological: Pt is alert. Not confused  Skin: Skin is warm. No erythema.  Psychiatric: Pt behavior is normal. Thought content normal.     Assessment & Plan:

## 2013-03-20 NOTE — Assessment & Plan Note (Signed)
stable overall by history and exam, and pt to continue medical treatment as before,  to f/u any worsening symptoms or concerns 

## 2013-03-20 NOTE — Assessment & Plan Note (Signed)
With mild vertigo, for cont'd maneuvers, and mucinex otc prn

## 2013-04-12 ENCOUNTER — Other Ambulatory Visit (INDEPENDENT_AMBULATORY_CARE_PROVIDER_SITE_OTHER): Payer: Managed Care, Other (non HMO)

## 2013-04-12 ENCOUNTER — Encounter: Payer: Self-pay | Admitting: Internal Medicine

## 2013-04-12 ENCOUNTER — Ambulatory Visit (INDEPENDENT_AMBULATORY_CARE_PROVIDER_SITE_OTHER): Payer: Managed Care, Other (non HMO) | Admitting: Internal Medicine

## 2013-04-12 VITALS — BP 110/72 | HR 81 | Temp 97.4°F | Ht 69.0 in | Wt 183.4 lb

## 2013-04-12 DIAGNOSIS — Z0000 Encounter for general adult medical examination without abnormal findings: Secondary | ICD-10-CM

## 2013-04-12 LAB — CBC WITH DIFFERENTIAL/PLATELET
Basophils Absolute: 0 10*3/uL (ref 0.0–0.1)
Basophils Relative: 0.5 % (ref 0.0–3.0)
Eosinophils Absolute: 0.1 10*3/uL (ref 0.0–0.7)
Eosinophils Relative: 1.1 % (ref 0.0–5.0)
HCT: 38.6 % (ref 36.0–46.0)
Hemoglobin: 13.2 g/dL (ref 12.0–15.0)
Lymphocytes Relative: 28.8 % (ref 12.0–46.0)
Lymphs Abs: 1.8 10*3/uL (ref 0.7–4.0)
MCHC: 34.2 g/dL (ref 30.0–36.0)
MCV: 91.9 fl (ref 78.0–100.0)
Monocytes Absolute: 0.6 10*3/uL (ref 0.1–1.0)
Monocytes Relative: 9 % (ref 3.0–12.0)
Neutro Abs: 3.8 10*3/uL (ref 1.4–7.7)
Neutrophils Relative %: 60.6 % (ref 43.0–77.0)
Platelets: 202 10*3/uL (ref 150.0–400.0)
RBC: 4.21 Mil/uL (ref 3.87–5.11)
RDW: 13.4 % (ref 11.5–14.6)
WBC: 6.2 10*3/uL (ref 4.5–10.5)

## 2013-04-12 LAB — BASIC METABOLIC PANEL
BUN: 15 mg/dL (ref 6–23)
CO2: 28 mEq/L (ref 19–32)
Calcium: 8.9 mg/dL (ref 8.4–10.5)
Chloride: 107 mEq/L (ref 96–112)
Creatinine, Ser: 0.8 mg/dL (ref 0.4–1.2)
GFR: 85.88 mL/min (ref >60.00)
Glucose, Bld: 83 mg/dL (ref 70–99)
Potassium: 4 mEq/L (ref 3.5–5.1)
Sodium: 139 mEq/L (ref 135–145)

## 2013-04-12 LAB — LIPID PANEL
Cholesterol: 188 mg/dL (ref 0–200)
HDL: 39.7 mg/dL (ref >39.00)
LDL Cholesterol: 129 mg/dL — ABNORMAL HIGH (ref 0–99)
Total CHOL/HDL Ratio: 5
Triglycerides: 96 mg/dL (ref 0.0–149.0)
VLDL: 19.2 mg/dL (ref 0.0–40.0)

## 2013-04-12 LAB — HEPATIC FUNCTION PANEL
ALT: 17 U/L (ref 0–35)
AST: 13 U/L (ref 0–37)
Albumin: 3.9 g/dL (ref 3.5–5.2)
Alkaline Phosphatase: 51 U/L (ref 39–117)
Bilirubin, Direct: 0.1 mg/dL (ref 0.0–0.3)
Total Bilirubin: 0.6 mg/dL (ref 0.3–1.2)
Total Protein: 6.7 g/dL (ref 6.0–8.3)

## 2013-04-12 LAB — URINALYSIS, ROUTINE W REFLEX MICROSCOPIC
Bilirubin Urine: NEGATIVE
Hgb urine dipstick: NEGATIVE
Ketones, ur: NEGATIVE
Leukocytes, UA: NEGATIVE
Nitrite: NEGATIVE
Specific Gravity, Urine: 1.02 (ref 1.000–1.030)
Total Protein, Urine: NEGATIVE
Urine Glucose: NEGATIVE
Urobilinogen, UA: 0.2 (ref 0.0–1.0)
pH: 7 (ref 5.0–8.0)

## 2013-04-12 LAB — TSH: TSH: 3.56 u[IU]/mL (ref 0.35–5.50)

## 2013-04-12 MED ORDER — FLUOXETINE HCL 20 MG PO CAPS: ORAL_CAPSULE | ORAL | Status: DC

## 2013-04-12 MED ORDER — FLUTICASONE PROPIONATE 50 MCG/ACT NA SUSP: 2.0000 | Freq: Every day | NASAL | Status: DC

## 2013-04-12 NOTE — Assessment & Plan Note (Signed)

## 2013-04-12 NOTE — Progress Notes (Signed)
Pre-visit discussion using our clinic review tool. No additional management support is needed unless otherwise documented below in the visit note.  

## 2013-04-12 NOTE — Patient Instructions (Signed)
Please continue all other medications as before, and refills have been done if requested. Please have the pharmacy call with any other refills you may need.  Please continue your efforts at being more active, low cholesterol diet, and weight control.  You are otherwise up to date with prevention measures today.  Please go to the LAB in the Basement (turn left off the elevator) for the tests to be done today  You will be contacted by phone if any changes need to be made immediately.  Otherwise, you will receive a letter about your results with an explanation, but please check with MyChart first.  Please remember to sign up for MyChart if you have not done so, as this will be important to you in the future with finding out test results, communicating by private email, and scheduling acute appointments online when needed.  Please return in 1 year for your yearly visit, or sooner if needed, with Lab testing done 3-5 days before  

## 2013-04-12 NOTE — Progress Notes (Signed)
Subjective:    Patient ID: Caitlin Greene, female    DOB: 29-Sep-1965, 47 y.o.   MRN: [id number]  HPI  Here for wellness and f/u;  Overall doing ok;  Pt denies CP, worsening SOB, DOE, wheezing, orthopnea, PND, worsening LE edema, palpitations, dizziness or syncope.  Pt denies neurological change such as new headache, facial or extremity weakness.  Pt denies polydipsia, polyuria, or low sugar symptoms. Pt states overall good compliance with treatment and medications, good tolerability, and has been trying to follow lower cholesterol diet.  Pt denies worsening depressive symptoms, suicidal ideation or panic. No fever, night sweats, wt loss, loss of appetite, or other constitutional symptoms.  Pt states good ability with ADL's, has low fall risk, home safety reviewed and adequate, no other significant changes in hearing or vision, and only occasionally active with exercise.  No acute compliants.  Allergy symptoms not as much this yr.  Lost 10 lbs in the past yr with better diet, more activity.  Past Medical History  Diagnosis Date  . BREAST CANCER, HX OF 01/21/2007    at 47yo  . ANXIETY   . DEPRESSION   . HYPERLIPIDEMIA   . ALLERGIC RHINITIS   . GERD   . RENAL CALCULUS   . ANEMIA-IRON DEFICIENCY    Past Surgical History  Procedure Laterality Date  . Mastectomy      right  . Tonsillectomy    . Breast enhancement surgery    . Temporomandibular joint surgery      Left    reports that she has never smoked. She has never used smokeless tobacco. She reports that she does not drink alcohol or use illicit drugs. family history includes Cancer in her mother; Colon polyps in her father and mother; Diabetes in her other; Hypertension in her other. Allergies  Allergen Reactions  . Amoxicillin-Pot Clavulanate Hives  . Codeine Nausea And Vomiting    Needs pre-meds   Current Outpatient Prescriptions on File Prior to Visit  Medication Sig Dispense Refill  . naproxen sodium (ANAPROX) 220 MG tablet  Take 220 mg by mouth 2 (two) times daily as needed. For pain       No current facility-administered medications on file prior to visit.    Review of Systems Constitutional: Negative for diaphoresis, activity change, appetite change or unexpected weight change.  HENT: Negative for hearing loss, ear pain, facial swelling, mouth sores and neck stiffness.   Eyes: Negative for pain, redness and visual disturbance.  Respiratory: Negative for shortness of breath and wheezing.   Cardiovascular: Negative for chest pain and palpitations.  Gastrointestinal: Negative for diarrhea, blood in stool, abdominal distention or other pain Genitourinary: Negative for hematuria, flank pain or change in urine volume.  Musculoskeletal: Negative for myalgias and joint swelling.  Skin: Negative for color change and wound.  Neurological: Negative for syncope and numbness. other than noted Hematological: Negative for adenopathy.  Psychiatric/Behavioral: Negative for hallucinations, self-injury, decreased concentration and agitation.      Objective:   Physical Exam BP 110/72  Pulse 81  Temp(Src) 97.4 F (36.3 C) (Oral)  Ht 5\' 9"  (1.753 m)  Wt 183 lb 6 oz (83.178 kg)  BMI 27.07 kg/m2  SpO2 99% VS noted,  Constitutional: Pt is oriented to person, place, and time. Appears well-developed and well-nourished.  Head: Normocephalic and atraumatic.  Right Ear: External ear normal.  Left Ear: External ear normal.  Nose: Nose normal.  Mouth/Throat: Oropharynx is clear and moist.  Eyes: Conjunctivae and EOM are  normal. Pupils are equal, round, and reactive to light.  Neck: Normal range of motion. Neck supple. No JVD present. No tracheal deviation present.  Cardiovascular: Normal rate, regular rhythm, normal heart sounds and intact distal pulses.   Pulmonary/Chest: Effort normal and breath sounds normal.  Abdominal: Soft. Bowel sounds are normal. There is no tenderness. No HSM  Musculoskeletal: Normal range of  motion. Exhibits no edema.  Lymphadenopathy:  Has no cervical adenopathy.  Neurological: Pt is alert and oriented to person, place, and time. Pt has normal reflexes. No cranial nerve deficit.  Skin: Skin is warm and dry. No rash noted.  Psychiatric:  Has  normal mood and affect. Behavior is normal.     Assessment & Plan:

## 2013-07-06 ENCOUNTER — Encounter: Payer: Self-pay | Admitting: Internal Medicine

## 2013-07-06 ENCOUNTER — Ambulatory Visit (INDEPENDENT_AMBULATORY_CARE_PROVIDER_SITE_OTHER): Payer: Managed Care, Other (non HMO) | Admitting: Internal Medicine

## 2013-07-06 VITALS — BP 102/62 | HR 84 | Temp 98.5°F | Ht 69.0 in | Wt 185.0 lb

## 2013-07-06 DIAGNOSIS — J019 Acute sinusitis, unspecified: Secondary | ICD-10-CM

## 2013-07-06 DIAGNOSIS — B379 Candidiasis, unspecified: Secondary | ICD-10-CM

## 2013-07-06 DIAGNOSIS — T3695XA Adverse effect of unspecified systemic antibiotic, initial encounter: Secondary | ICD-10-CM

## 2013-07-06 MED ORDER — DOXYCYCLINE HYCLATE 100 MG PO TABS: 100.0000 mg | ORAL_TABLET | Freq: Two times a day (BID) | ORAL | Status: DC

## 2013-07-06 MED ORDER — FLUCONAZOLE 150 MG PO TABS: 150.0000 mg | ORAL_TABLET | Freq: Once | ORAL | Status: DC

## 2013-07-06 NOTE — Patient Instructions (Addendum)

## 2013-07-06 NOTE — Progress Notes (Signed)
HPI  Pt presents to the clinic today with c/o headache, sore throat, nasal congestion. This started about 5 days ago. She is blowing thick green mucous out of her nose, and has been running low grade fevers. She has tried Mucinex, flonase, and zyrtec. She does have a history of seasonal allergies and sinusitis. She does not smoke. She has not had sick contacts that she is aware of.  Review of Systems    Past Medical History  Diagnosis Date  . BREAST CANCER, HX OF 01/21/2007    at 48yo  . ANXIETY   . DEPRESSION   . HYPERLIPIDEMIA   . ALLERGIC RHINITIS   . GERD   . RENAL CALCULUS   . ANEMIA-IRON DEFICIENCY     Family History  Problem Relation Age of Onset  . Colon polyps Mother   . Cancer Mother     Uterine Cancer  . Colon polyps Father   . Hypertension Other   . Diabetes Other     History   Social History  . Marital Status: Married    Spouse Name: N/A    Number of Children: 0  . Years of Education: N/A   Occupational History  . BANKRUPTCY ANALYST [employer]   Social History Main Topics  . Smoking status: Never Smoker   . Smokeless tobacco: Never Used  . Alcohol Use: No  . Drug Use: No  . Sexual Activity: Not on file   Other Topics Concern  . Not on file   Social History Narrative  . No narrative on file    Allergies  Allergen Reactions  . Amoxicillin-Pot Clavulanate Hives  . Codeine Nausea And Vomiting    Needs pre-meds     Constitutional: Positive headache, fatigue and fever. Denies abrupt weight changes.  HEENT:  Positive eye pain, pressure behind the eyes, facial pain, nasal congestion and sore throat. Denies eye redness, ear pain, ringing in the ears, wax buildup, runny nose or bloody nose. Respiratory: Positive cough. Denies difficulty breathing or shortness of breath.  Cardiovascular: Denies chest pain, chest tightness, palpitations or swelling in the hands or feet.   No other specific complaints in a complete review of systems (except as  listed in HPI above).  Objective:  BP 102/62  Pulse 84  Temp(Src) 98.5 F (36.9 C) (Oral)  Ht 5\' 9"  (1.753 m)  Wt 185 lb (83.915 kg)  BMI 27.31 kg/m2  SpO2 98%   General: Appears her stated age, well developed, well nourished in NAD. HEENT: Head: normal shape and size, maxillary sinus tenderness noted; Eyes: sclera white, no icterus, conjunctiva pink, PERRLA and EOMs intact; Ears: Tm's gray and intact, normal light reflex; Nose: mucosa pink and moist, septum midline; Throat/Mouth: + PND. Teeth present, mucosa pink and moist, no exudate noted, no lesions or ulcerations noted.  Neck: Neck supple, trachea midline. No massses, lumps or thyromegaly present.  Cardiovascular: Normal rate and rhythm. S1,S2 noted.  No murmur, rubs or gallops noted. No JVD or BLE edema. No carotid bruits noted. Pulmonary/Chest: Normal effort and positive vesicular breath sounds. No respiratory distress. No wheezes, rales or ronchi noted.      Assessment & Plan:   Acute bacterial sinusitis  Can use a Neti Pot which can be purchased from your local drug store. Flonase 2 sprays each nostril for 3 days and then as needed. Continue zyrtec Doxycycline BID for 10 days (she is allergic to augmentin and feels like the Levaquin causes hot flashes) RX for diflucan for  antibiotic induced yeast infection  RTC as needed or if symptoms persist.

## 2013-07-06 NOTE — Progress Notes (Signed)
Pre visit review using our clinic review tool, if applicable. No additional management support is needed unless otherwise documented below in the visit note. 

## 2013-07-18 ENCOUNTER — Telehealth: Payer: Self-pay | Admitting: Internal Medicine

## 2013-07-18 ENCOUNTER — Encounter: Payer: Self-pay | Admitting: Internal Medicine

## 2013-07-18 ENCOUNTER — Ambulatory Visit (INDEPENDENT_AMBULATORY_CARE_PROVIDER_SITE_OTHER): Payer: Managed Care, Other (non HMO) | Admitting: Internal Medicine

## 2013-07-18 VITALS — BP 110/60 | HR 88 | Temp 98.3°F | Ht 69.0 in | Wt 183.4 lb

## 2013-07-18 DIAGNOSIS — J209 Acute bronchitis, unspecified: Secondary | ICD-10-CM

## 2013-07-18 MED ORDER — HYDROCODONE-HOMATROPINE 5-1.5 MG/5ML PO SYRP: 5.0000 mL | ORAL_SOLUTION | Freq: Four times a day (QID) | ORAL | Status: DC | PRN

## 2013-07-18 MED ORDER — DOXYCYCLINE HYCLATE 100 MG PO TABS: 100.0000 mg | ORAL_TABLET | Freq: Two times a day (BID) | ORAL | Status: DC

## 2013-07-18 MED ORDER — PREDNISONE 10 MG PO TABS: ORAL_TABLET | ORAL | Status: DC

## 2013-07-18 NOTE — Assessment & Plan Note (Signed)
Mild to mod, for antibx course,  to f/u any worsening symptoms or concerns 

## 2013-07-18 NOTE — Telephone Encounter (Signed)
Patient is calling to see if she was supposed to be prescribed Prednisone or if Dr. Jenny Reichmann had changed his mind. States that she recalled it being mentioned at her OV. Please advise.

## 2013-07-18 NOTE — Telephone Encounter (Signed)
Ok for short course - Done erx

## 2013-07-18 NOTE — Progress Notes (Signed)
Pre visit review using our clinic review tool, if applicable. No additional management support is needed unless otherwise documented below in the visit note. 

## 2013-07-18 NOTE — Progress Notes (Signed)
   Subjective:    Patient ID: Caitlin Greene, female    DOB: 02/28/1966, 48 y.o.   MRN: [id number]  HPI  Here with acute onset mild to mod 2-3 days ST, HA, general weakness and malaise, with prod cough greenish sputum, but Pt denies chest pain, increased sob or doe, wheezing, orthopnea, PND, increased LE swelling, palpitations, dizziness or syncope, except for mild wheezing onset last PM. Pt denies new neurological symptoms such as new headache, or facial or extremity weakness or numbness Past Medical History  Diagnosis Date  . BREAST CANCER, HX OF 01/21/2007    at 48yo  . ANXIETY   . DEPRESSION   . HYPERLIPIDEMIA   . ALLERGIC RHINITIS   . GERD   . RENAL CALCULUS   . ANEMIA-IRON DEFICIENCY    Past Surgical History  Procedure Laterality Date  . Mastectomy      right  . Tonsillectomy    . Breast enhancement surgery    . Temporomandibular joint surgery      Left    reports that she has never smoked. She has never used smokeless tobacco. She reports that she does not drink alcohol or use illicit drugs. family history includes Cancer in her mother; Colon polyps in her father and mother; Diabetes in her other; Hypertension in her other. Allergies  Allergen Reactions  . Amoxicillin-Pot Clavulanate Hives  . Codeine Nausea And Vomiting    Needs pre-meds   Current Outpatient Prescriptions on File Prior to Visit  Medication Sig Dispense Refill  . FLUoxetine (PROZAC) 20 MG capsule TAKE ONE CAPSULE BY MOUTH DAILY  90 capsule  3  . fluticasone (FLONASE) 50 MCG/ACT nasal spray Place 2 sprays into both nostrils daily.  16 g  11  . naproxen sodium (ANAPROX) 220 MG tablet Take 220 mg by mouth 2 (two) times daily as needed. For pain      . fluconazole (DIFLUCAN) 150 MG tablet Take 1 tablet (150 mg total) by mouth once.  1 tablet  0   No current facility-administered medications on file prior to visit.   Review of Systems  All otherwise neg per pt  Objective:   Physical Exam BP 110/60   Pulse 88  Temp(Src) 98.3 F (36.8 C) (Oral)  Ht 5\' 9"  (1.753 m)  Wt 183 lb 6 oz (83.178 kg)  BMI 27.07 kg/m2  SpO2 95% VS noted, mild ill Constitutional: Pt appears well-developed and well-nourished.  HENT: Head: NCAT.  Right Ear: External ear normal.  Left Ear: External ear normal.  Bilat tm's with mild erythema.  Max sinus areas non tender.  Pharynx with mild erythema, no exudate Eyes: Conjunctivae and EOM are normal. Pupils are equal, round, and reactive to light.  Neck: Normal range of motion. Neck supple.  Cardiovascular: Normal rate and regular rhythm.   Pulmonary/Chest: Effort normal and breath sounds mild decr bilat, few wheeze bilat Neurological: Pt is alert. Not confused  Skin: Skin is warm. No erythema.  Psychiatric: Pt behavior is normal. Thought content normal.     Assessment & Plan:

## 2013-07-18 NOTE — Patient Instructions (Signed)
Please take all new medication as prescribed Please continue all other medications as before, and refills have been done if requested. Please have the pharmacy call with any other refills you may need.  Please continue your efforts at being more active, low cholesterol diet, and weight control.  Please keep your appointments with your specialists as you may have planned  Please remember to sign up for MyChart if you have not done so, as this will be important to you in the future with finding out test results, communicating by private email, and scheduling acute appointments online when needed.  You are given the work note today

## 2013-07-19 NOTE — Telephone Encounter (Signed)
Patient informed. 

## 2013-10-10 ENCOUNTER — Encounter: Payer: Self-pay | Admitting: Internal Medicine

## 2013-10-10 ENCOUNTER — Ambulatory Visit (INDEPENDENT_AMBULATORY_CARE_PROVIDER_SITE_OTHER): Payer: Managed Care, Other (non HMO) | Admitting: Internal Medicine

## 2013-10-10 VITALS — BP 108/80 | HR 92 | Temp 97.9°F | Wt 187.1 lb

## 2013-10-10 DIAGNOSIS — J019 Acute sinusitis, unspecified: Secondary | ICD-10-CM | POA: Insufficient documentation

## 2013-10-10 MED ORDER — SULFAMETHOXAZOLE-TRIMETHOPRIM 800-160 MG PO TABS: 1.0000 | ORAL_TABLET | Freq: Two times a day (BID) | ORAL | Status: DC

## 2013-10-10 NOTE — Assessment & Plan Note (Signed)
Mild to mod, for antibx course,  to f/u any worsening symptoms or concerns 

## 2013-10-10 NOTE — Progress Notes (Signed)
Pre visit review using our clinic review tool, if applicable. No additional management support is needed unless otherwise documented below in the visit note. 

## 2013-10-10 NOTE — Patient Instructions (Signed)
Please take all new medication as prescribed  Please continue all other medications as before, and refills have been done if requested.  Please have the pharmacy call with any other refills you may need.   

## 2013-10-10 NOTE — Progress Notes (Signed)
   Subjective:    Patient ID: Caitlin Greene, female    DOB: December 22, 1965, 49 y.o.   MRN: [id number]  HPI   Here with 2-3 days acute onset fever, facial pain, pressure, headache, general weakness and malaise, and greenish d/c, with mild ST and cough, but pt denies chest pain, wheezing, increased sob or doe, orthopnea, PND, increased LE swelling, palpitations, dizziness or syncope. No other compalints Past Medical History  Diagnosis Date  . BREAST CANCER, HX OF 01/21/2007    at 48yo  . ANXIETY   . DEPRESSION   . HYPERLIPIDEMIA   . ALLERGIC RHINITIS   . GERD   . RENAL CALCULUS   . ANEMIA-IRON DEFICIENCY    Past Surgical History  Procedure Laterality Date  . Mastectomy      right  . Tonsillectomy    . Breast enhancement surgery    . Temporomandibular joint surgery      Left    reports that she has never smoked. She has never used smokeless tobacco. She reports that she does not drink alcohol or use illicit drugs. family history includes Cancer in her mother; Colon polyps in her father and mother; Diabetes in her other; Hypertension in her other. Allergies  Allergen Reactions  . Amoxicillin-Pot Clavulanate Hives  . Codeine Nausea And Vomiting    Needs pre-meds   Current Outpatient Prescriptions on File Prior to Visit  Medication Sig Dispense Refill  . FLUoxetine (PROZAC) 20 MG capsule TAKE ONE CAPSULE BY MOUTH DAILY  90 capsule  3  . fluticasone (FLONASE) 50 MCG/ACT nasal spray Place 2 sprays into both nostrils daily.  16 g  11  . naproxen sodium (ANAPROX) 220 MG tablet Take 220 mg by mouth 2 (two) times daily as needed. For pain       No current facility-administered medications on file prior to visit.   Review of Systems All otherwise neg per pt     Objective:   Physical Exam BP 108/80  Pulse 92  Temp(Src) 97.9 F (36.6 C) (Oral)  Wt 187 lb 2 oz (84.879 kg)  SpO2 99% VS noted, mild ill Constitutional: Pt appears well-developed, well-nourished.  HENT: Head: NCAT.    Right Ear: External ear normal.  Left Ear: External ear normal.  Bilat tm's with mild erythema.  Max sinus areas mild tender, worse over glabellar region,  Pharynx with mild erythema, no exudate Eyes: . Pupils are equal, round, and reactive to light. Conjunctivae and EOM are normal Neck: Normal range of motion. Neck supple.  Cardiovascular: Normal rate and regular rhythm.   Pulmonary/Chest: Effort normal and breath sounds normal.  Neurological: Pt is alert. Not confused , motor grossly intact Skin: Skin is warm. No rash Psychiatric: Pt behavior is normal. No agitation.      Assessment & Plan:

## 2013-11-05 ENCOUNTER — Telehealth: Payer: Self-pay

## 2013-11-05 MED ORDER — MECLIZINE HCL 25 MG PO TABS: 25.0000 mg | ORAL_TABLET | Freq: Three times a day (TID) | ORAL | Status: DC | PRN

## 2013-11-05 NOTE — Telephone Encounter (Signed)
[facility] requesting refill on meclizine 25 mg.  Advise as not on current list.

## 2013-11-05 NOTE — Telephone Encounter (Signed)
Done erx 

## 2014-01-02 ENCOUNTER — Encounter: Payer: Self-pay | Admitting: Internal Medicine

## 2014-01-02 ENCOUNTER — Ambulatory Visit (INDEPENDENT_AMBULATORY_CARE_PROVIDER_SITE_OTHER): Payer: Managed Care, Other (non HMO) | Admitting: Internal Medicine

## 2014-01-02 VITALS — BP 122/82 | HR 84 | Temp 98.0°F | Wt 190.1 lb

## 2014-01-02 DIAGNOSIS — J0110 Acute frontal sinusitis, unspecified: Secondary | ICD-10-CM

## 2014-01-02 MED ORDER — CLARITHROMYCIN 500 MG PO TABS: 500.0000 mg | ORAL_TABLET | Freq: Two times a day (BID) | ORAL | Status: DC

## 2014-01-02 NOTE — Patient Instructions (Signed)
Please take all new medication as prescribed  Please continue all other medications as before, and refills have been done if requested.  Please have the pharmacy call with any other refills you may need.  Please keep your appointments with your specialists as you may have planned     

## 2014-01-02 NOTE — Assessment & Plan Note (Signed)
Mild to mod, for antibx course,  to f/u any worsening symptoms or concerns 

## 2014-01-02 NOTE — Progress Notes (Signed)
   Subjective:    Patient ID: Caitlin Greene, female    DOB: 1965-10-28, 48 y.o.   MRN: [id number]  HPI  Here with 2-3 days acute onset fever, facial pain, pressure, headache, general weakness and malaise, and greenish d/c, with mild ST and cough, but pt denies chest pain, wheezing, increased sob or doe, orthopnea, PND, increased LE swelling, palpitations, dizziness or syncope.  Past Medical History  Diagnosis Date  . BREAST CANCER, HX OF 01/21/2007    at 48yo  . ANXIETY   . DEPRESSION   . HYPERLIPIDEMIA   . ALLERGIC RHINITIS   . GERD   . RENAL CALCULUS   . ANEMIA-IRON DEFICIENCY    Past Surgical History  Procedure Laterality Date  . Mastectomy      right  . Tonsillectomy    . Breast enhancement surgery    . Temporomandibular joint surgery      Left    reports that she has never smoked. She has never used smokeless tobacco. She reports that she does not drink alcohol or use illicit drugs. family history includes Cancer in her mother; Colon polyps in her father and mother; Diabetes in her other; Hypertension in her other. Allergies  Allergen Reactions  . Amoxicillin-Pot Clavulanate Hives  . Codeine Nausea And Vomiting    Needs pre-meds   Current Outpatient Prescriptions on File Prior to Visit  Medication Sig Dispense Refill  . FLUoxetine (PROZAC) 20 MG capsule TAKE ONE CAPSULE BY MOUTH DAILY  90 capsule  3  . fluticasone (FLONASE) 50 MCG/ACT nasal spray Place 2 sprays into both nostrils daily.  16 g  11  . meclizine (ANTIVERT) 25 MG tablet Take 1 tablet (25 mg total) by mouth 3 (three) times daily as needed for dizziness.  30 tablet  2  . naproxen sodium (ANAPROX) 220 MG tablet Take 220 mg by mouth 2 (two) times daily as needed. For pain       No current facility-administered medications on file prior to visit.   Review of Systems   All otherwise neg per pt   Objective:   Physical Exam BP 122/82  Pulse 84  Temp(Src) 98 F (36.7 C) (Oral)  Wt 190 lb 2 oz (86.24 kg)   SpO2 96% VS noted,  Constitutional: Pt appears well-developed, well-nourished.  HENT: Head: NCAT.  Right Ear: External ear normal.  Left Ear: External ear normal.  Eyes: . Pupils are equal, round, and reactive to light. Conjunctivae and EOM are normal Neck: Normal range of motion. Neck supple.  Cardiovascular: Normal rate and regular rhythm.   Bilat tm's with mild erythema.  Max sinus areas mild tender.  Pharynx with mild erythema, no exudate Pulmonary/Chest: Effort normal and breath sounds normal.  - no rales or wheezing Neurological: Pt is alert. Not confused , motor grossly intact Skin: Skin is warm. No rash Psychiatric: Pt behavior is normal. No agitation.      Assessment & Plan:

## 2014-01-02 NOTE — Progress Notes (Signed)
Pre visit review using our clinic review tool, if applicable. No additional management support is needed unless otherwise documented below in the visit note. 

## 2014-02-21 ENCOUNTER — Other Ambulatory Visit: Payer: Self-pay

## 2014-04-08 ENCOUNTER — Encounter: Payer: Self-pay | Admitting: Internal Medicine

## 2014-04-08 ENCOUNTER — Ambulatory Visit (INDEPENDENT_AMBULATORY_CARE_PROVIDER_SITE_OTHER): Payer: Managed Care, Other (non HMO) | Admitting: Internal Medicine

## 2014-04-08 VITALS — BP 110/80 | HR 83 | Temp 98.4°F | Ht 69.0 in | Wt 196.4 lb

## 2014-04-08 DIAGNOSIS — Z23 Encounter for immunization: Secondary | ICD-10-CM

## 2014-04-08 DIAGNOSIS — R300 Dysuria: Secondary | ICD-10-CM

## 2014-04-08 LAB — POCT URINALYSIS DIPSTICK
Bilirubin, UA: NEGATIVE
Glucose, UA: NEGATIVE
Ketones, UA: NEGATIVE
Nitrite, UA: NEGATIVE
Protein, UA: NEGATIVE
Spec Grav, UA: 1.02
Urobilinogen, UA: 1
pH, UA: 6.5

## 2014-04-08 MED ORDER — CIPROFLOXACIN HCL 500 MG PO TABS: 500.0000 mg | ORAL_TABLET | Freq: Two times a day (BID) | ORAL | Status: DC

## 2014-04-08 NOTE — Patient Instructions (Signed)
Please take all new medication as prescribed  Please continue all other medications as before, and refills have been done if requested.  Please have the pharmacy call with any other refills you may need.  Please keep your appointments with your specialists as you may have planned     

## 2014-04-08 NOTE — Progress Notes (Signed)
   Subjective:    Patient ID: Caitlin Greene, female    DOB: 09-15-1965, 48 y.o.   MRN: [id number]  HPI  Here with acute onset 2-3 days dysuria with freq and unusual odor, but Denies urinary symptoms such as urgency, flank pain, hematuria or n/v, fever, chills. Pt denies chest pain, increased sob or doe, wheezing, orthopnea, PND, increased LE swelling, palpitations, dizziness or syncope. Past Medical History  Diagnosis Date  . BREAST CANCER, HX OF 01/21/2007    at 48yo  . ANXIETY   . DEPRESSION   . HYPERLIPIDEMIA   . ALLERGIC RHINITIS   . GERD   . RENAL CALCULUS   . ANEMIA-IRON DEFICIENCY    Past Surgical History  Procedure Laterality Date  . Mastectomy      right  . Tonsillectomy    . Breast enhancement surgery    . Temporomandibular joint surgery      Left    reports that she has never smoked. She has never used smokeless tobacco. She reports that she does not drink alcohol or use illicit drugs. family history includes Cancer in her mother; Colon polyps in her father and mother; Diabetes in her other; Hypertension in her other. Allergies  Allergen Reactions  . Amoxicillin-Pot Clavulanate Hives  . Codeine Nausea And Vomiting    Needs pre-meds   Current Outpatient Prescriptions on File Prior to Visit  Medication Sig Dispense Refill  . cetirizine (ZYRTEC) 10 MG tablet Take 10 mg by mouth daily.    . clarithromycin (BIAXIN) 500 MG tablet Take 1 tablet (500 mg total) by mouth 2 (two) times daily. 20 tablet 0  . fluticasone (FLONASE) 50 MCG/ACT nasal spray Place 2 sprays into both nostrils daily. 16 g 11  . meclizine (ANTIVERT) 25 MG tablet Take 1 tablet (25 mg total) by mouth 3 (three) times daily as needed for dizziness. 30 tablet 2  . naproxen sodium (ANAPROX) 220 MG tablet Take 220 mg by mouth 2 (two) times daily as needed. For pain     No current facility-administered medications on file prior to visit.   Review of Systems All otherwise neg per pt     Objective:   Physical Exam BP 110/80 mmHg  Pulse 83  Temp(Src) 98.4 F (36.9 C) (Oral)  Ht 5\' 9"  (1.753 m)  Wt 196 lb 6 oz (89.075 kg)  BMI 28.99 kg/m2  SpO2 97% VS noted, mild ill appearing Constitutional: Pt appears well-developed, well-nourished.  HENT: Head: NCAT.  Right Ear: External ear normal.  Left Ear: External ear normal.  Eyes: . Pupils are equal, round, and reactive to light. Conjunctivae and EOM are normal Neck: Normal range of motion. Neck supple.  Cardiovascular: Normal rate and regular rhythm.   Pulmonary/Chest: Effort normal and breath sounds normal.  Abd:  Soft, ND, + BS, mild low mid abd tender, no guarding or rebound, no flank tender Neurological: Pt is alert. Not confused , motor grossly intact Skin: Skin is warm. No rash Psychiatric: Pt behavior is normal. No agitation.     Assessment & Plan:

## 2014-04-08 NOTE — Progress Notes (Signed)
Pre visit review using our clinic review tool, if applicable. No additional management support is needed unless otherwise documented below in the visit note. 

## 2014-04-11 ENCOUNTER — Other Ambulatory Visit: Payer: Self-pay | Admitting: Internal Medicine

## 2014-04-12 ENCOUNTER — Other Ambulatory Visit: Payer: Self-pay | Admitting: Internal Medicine

## 2014-04-13 DIAGNOSIS — R300 Dysuria: Secondary | ICD-10-CM | POA: Insufficient documentation

## 2014-04-13 NOTE — Assessment & Plan Note (Signed)
Mild to mod, c/w prob acute cystitis, for urine cx and antibx course,  to f/u any worsening symptoms or concerns

## 2014-04-16 ENCOUNTER — Encounter: Payer: Managed Care, Other (non HMO) | Admitting: Internal Medicine

## 2014-04-21 ENCOUNTER — Emergency Department
Admission: EM | Admit: 2014-04-21 | Discharge: 2014-04-21 | Disposition: A | Discharge status: Home / Self Care | Payer: Managed Care, Other (non HMO) | Source: Home / Self Care | Attending: Family Medicine | Admitting: Family Medicine

## 2014-04-21 ENCOUNTER — Emergency Department (INDEPENDENT_AMBULATORY_CARE_PROVIDER_SITE_OTHER): Payer: Managed Care, Other (non HMO)

## 2014-04-21 ENCOUNTER — Encounter: Payer: Self-pay | Admitting: Emergency Medicine

## 2014-04-21 DIAGNOSIS — M25521 Pain in right elbow: Secondary | ICD-10-CM

## 2014-04-21 DIAGNOSIS — N644 Mastodynia: Secondary | ICD-10-CM

## 2014-04-21 DIAGNOSIS — R601 Generalized edema: Secondary | ICD-10-CM

## 2014-04-21 DIAGNOSIS — R52 Pain, unspecified: Secondary | ICD-10-CM

## 2014-04-21 DIAGNOSIS — Z9889 Other specified postprocedural states: Secondary | ICD-10-CM

## 2014-04-21 DIAGNOSIS — R079 Chest pain, unspecified: Secondary | ICD-10-CM

## 2014-04-21 DIAGNOSIS — Z853 Personal history of malignant neoplasm of breast: Secondary | ICD-10-CM

## 2014-04-21 LAB — COMPREHENSIVE METABOLIC PANEL
ALT: 15 U/L (ref 0–35)
AST: 14 U/L (ref 0–37)
Albumin: 4.1 g/dL (ref 3.5–5.2)
Alkaline Phosphatase: 50 U/L (ref 39–117)
BUN: 14 mg/dL (ref 6–23)
CO2: 24 mEq/L (ref 19–32)
Calcium: 9.3 mg/dL (ref 8.4–10.5)
Chloride: 106 mEq/L (ref 96–112)
Creat: 0.84 mg/dL (ref 0.50–1.10)
Glucose, Bld: 81 mg/dL (ref 70–99)
Potassium: 4.3 mEq/L (ref 3.5–5.3)
Sodium: 139 mEq/L (ref 135–145)
Total Bilirubin: 0.6 mg/dL (ref 0.2–1.2)
Total Protein: 6.8 g/dL (ref 6.0–8.3)

## 2014-04-21 LAB — CBC WITH DIFFERENTIAL/PLATELET
Basophils Absolute: 0 10*3/uL (ref 0.0–0.1)
Basophils Relative: 0 % (ref 0–1)
Eosinophils Absolute: 0.1 10*3/uL (ref 0.0–0.7)
Eosinophils Relative: 1 % (ref 0–5)
HCT: 37.8 % (ref 36.0–46.0)
Hemoglobin: 13 g/dL (ref 12.0–15.0)
Lymphocytes Relative: 29 % (ref 12–46)
Lymphs Abs: 2.1 10*3/uL (ref 0.7–4.0)
MCH: 30 pg (ref 26.0–34.0)
MCHC: 34.4 g/dL (ref 30.0–36.0)
MCV: 87.1 fL (ref 78.0–100.0)
MPV: 10.5 fL (ref 9.4–12.4)
Monocytes Absolute: 0.6 10*3/uL (ref 0.1–1.0)
Monocytes Relative: 8 % (ref 3–12)
Neutro Abs: 4.5 10*3/uL (ref 1.7–7.7)
Neutrophils Relative %: 62 % (ref 43–77)
Platelets: 230 10*3/uL (ref 150–400)
RBC: 4.34 MIL/uL (ref 3.87–5.11)
RDW: 14.4 % (ref 11.5–15.5)
WBC: 7.2 10*3/uL (ref 4.0–10.5)

## 2014-04-21 LAB — HEMOGLOBIN A1C
Hgb A1c MFr Bld: 5.5 % (ref <5.7)
Mean Plasma Glucose: 111 mg/dL (ref <117)

## 2014-04-21 LAB — TSH: TSH: 3.223 u[IU]/mL (ref 0.350–4.500)

## 2014-04-21 LAB — LDL CHOLESTEROL, DIRECT: Direct LDL: 136 mg/dL — ABNORMAL HIGH

## 2014-04-21 MED ORDER — MELOXICAM 15 MG PO TABS: 15.0000 mg | ORAL_TABLET | Freq: Every day | ORAL | Status: DC

## 2014-04-21 NOTE — Discharge Instructions (Signed)
Tennis Elbow Your caregiver has diagnosed you with a condition often referred to as "tennis elbow." This results from small tears or soreness (inflammation) at the start (origin) of the extensor muscles of the forearm. Although the condition is often called tennis or golfer's elbow, it is caused by any repetitive action performed by your elbow. HOME CARE INSTRUCTIONS  If the condition has been short lived, rest may be the only treatment required. Using your opposite hand or arm to perform the task may help. Even changing your grip may help rest the extremity. These may even prevent the condition from recurring.  Longer standing problems, however, will often be relieved faster by:  Using anti-inflammatory agents.  Applying ice packs for 30 minutes at the end of the working day, at bed time, or when activities are finished.  Your caregiver may also have you wear a splint or sling. This will allow the inflamed tendon to heal. At times, steroid injections aided with a local anesthetic will be required along with splinting for 1 to 2 weeks. Two to three steroid injections will often solve the problem. In some long standing cases, the inflamed tendon does not respond to conservative (non-surgical) therapy. Then surgery may be required to repair it. MAKE SURE YOU:   Understand these instructions.  Will watch your condition.  Will get help right away if you are not doing well or get worse. Document Released: 04/25/2005 Document Revised: 07/18/2011 Document Reviewed: 12/12/2007 [facility] Patient Information 2015 [place], [place]. This information is not intended to replace advice given to you by your health care provider. Make sure you discuss any questions you have with your health care provider.  Costochondritis Costochondritis, sometimes called Tietze syndrome, is a swelling and irritation (inflammation) of the tissue (cartilage) that connects your ribs with your breastbone (sternum). It causes pain  in the chest and rib area. Costochondritis usually goes away on its own over time. It can take up to 6 weeks or longer to get better, especially if you are unable to limit your activities. CAUSES  Some cases of costochondritis have no known cause. Possible causes include:  Injury (trauma).  Exercise or activity such as lifting.  Severe coughing. SIGNS AND SYMPTOMS  Pain and tenderness in the chest and rib area.  Pain that gets worse when coughing or taking deep breaths.  Pain that gets worse with specific movements. DIAGNOSIS  Your health care provider will do a physical exam and ask about your symptoms. Chest X-rays or other tests may be done to rule out other problems. TREATMENT  Costochondritis usually goes away on its own over time. Your health care provider may prescribe medicine to help relieve pain. HOME CARE INSTRUCTIONS   Avoid exhausting physical activity. Try not to strain your ribs during normal activity. This would include any activities using chest, abdominal, and side muscles, especially if heavy weights are used.  Apply ice to the affected area for the first 2 days after the pain begins.  Put ice in a plastic bag.  Place a towel between your skin and the bag.  Leave the ice on for 20 minutes, 2-3 times a day.  Only take over-the-counter or prescription medicines as directed by your health care provider. SEEK MEDICAL CARE IF:  You have redness or swelling at the rib joints. These are signs of infection.  Your pain does not go away despite rest or medicine. SEEK IMMEDIATE MEDICAL CARE IF:   Your pain increases or you are very uncomfortable.  You  have shortness of breath or difficulty breathing.  You cough up blood.  You have worse chest pains, sweating, or vomiting.  You have a fever or persistent symptoms for more than 2-3 days.  You have a fever and your symptoms suddenly get worse. MAKE SURE YOU:   Understand these instructions.  Will watch your  condition.  Will get help right away if you are not doing well or get worse. Document Released: 02/02/2005 Document Revised: 02/13/2013 Document Reviewed: 11/27/2012 [facility] Patient Information 2015 [place], [place]. This information is not intended to replace advice given to you by your health care provider. Make sure you discuss any questions you have with your health care provider.

## 2014-04-21 NOTE — ED Provider Notes (Addendum)
CSN: [id number]     Arrival date & time 04/21/14  1019 History   First MD Initiated Contact with Patient 04/21/14 1030     Chief Complaint  Patient presents with  . Arm Pain    right  . Chest Pain    HPI  Pt presents today with chief complaint of chest pain and R elbow pain  R elbow pain: has been present for the past week. Baseline hx/o R lumpectomy and R breast ca s/p mastectomy. Pain mainly in R lateral elbow. Mild distal paresthesias at time. 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 Pain worse with wrist pronation and supination.  Chest Pain: 2 episodes of central CP and SOB earlier today. Lasted less than 30 seconds. No radiation. No assd nausea or diaphoresis. Happened while at rest. Self resolved. Does have some reproducible chest pain on palpation of sternum per pt. Does reports some moderate to heavy lifting recently. No prior hx/o cardiac disease. No HTN, HLD, DM. No family hx/o heart disease. Does report breast augmentation on R side s/p mastectomy 20+ years ago.  Preliminary HEART score 1 (for age=50 w/o trop)  Past Medical History  Diagnosis Date  . BREAST CANCER, HX OF 01/21/2007    at 48yo  . ANXIETY   . DEPRESSION   . HYPERLIPIDEMIA   . ALLERGIC RHINITIS   . GERD   . RENAL CALCULUS   . ANEMIA-IRON DEFICIENCY    Past Surgical History  Procedure Laterality Date  . Mastectomy      right  . Tonsillectomy    . Breast enhancement surgery    . Temporomandibular joint surgery      Left   Family History  Problem Relation Age of Onset  . Colon polyps Mother   . Cancer Mother     Uterine Cancer  . Colon polyps Father   . Hypertension Other   . Diabetes Other    History  Substance Use Topics  . Smoking status: Never Smoker   . Smokeless tobacco: Never Used  . Alcohol Use: No   OB History    No data available     Review of Systems  All other systems reviewed and are negative.   Allergies  Amoxicillin-pot  clavulanate and Codeine  Home Medications   Prior to Admission medications   Medication Sig Start Date End Date Taking? Authorizing Provider  cetirizine (ZYRTEC) 10 MG tablet Take 10 mg by mouth daily.    Historical Provider, MD  ciprofloxacin (CIPRO) 500 MG tablet Take 1 tablet (500 mg total) by mouth 2 (two) times daily. 04/08/14   Biagio Borg, MD  clarithromycin (BIAXIN) 500 MG tablet Take 1 tablet (500 mg total) by mouth 2 (two) times daily. 01/02/14   Biagio Borg, MD  FLUoxetine (PROZAC) 20 MG capsule TAKE ONE CAPSULE BY MOUTH EVERY DAY 04/11/14   Biagio Borg, MD  FLUoxetine (PROZAC) 20 MG capsule TAKE ONE CAPSULE BY MOUTH EVERY DAY 04/14/14   Biagio Borg, MD  fluticasone [facility]) 50 MCG/ACT nasal spray Place 2 sprays into both nostrils daily. 04/12/13   Biagio Borg, MD  meclizine (ANTIVERT) 25 MG tablet Take 1 tablet (25 mg total) by mouth 3 (three) times daily as needed for dizziness. 11/05/13   Biagio Borg, MD  naproxen sodium (ANAPROX) 220 MG tablet Take 220 mg by mouth 2 (two) times daily as needed. For pain    Historical Provider, MD  BP 105/79 mmHg  Pulse 67  Temp(Src) 97.7 F (36.5 C) (Oral)  Resp 16  Ht 5\' 5"  (1.651 m)  Wt 180 lb (81.647 kg)  BMI 29.95 kg/m2  SpO2 98% Physical Exam  Constitutional: She appears well-developed and well-nourished.  HENT:  Head: Normocephalic and atraumatic.  Eyes: Conjunctivae are normal. Pupils are equal, round, and reactive to light.  Neck: Normal range of motion.  Cardiovascular: Normal rate, regular rhythm and normal heart sounds.   Pulmonary/Chest: Effort normal and breath sounds normal.  + anterior chest wall TTP  Abdominal: Soft.  Musculoskeletal: Normal range of motion.       Arms: + TTP across R lateral epicondyle + pain over R lateral epicondyle w/ resisted wrist pronation/supination   Neurological: She is alert.  Skin: Skin is warm.    ED Course  Procedures (including critical care time) Labs Review Labs Reviewed -  No data to display  Imaging Review No results found.  EKG: NSR  MDM   1. Pain   2. Chest pain   3. Elbow pain, right    Elbow Pain: most consistent with lateral epicondylitis. Pain lateralizes predominantly over lateral epicondyle. R elbow xray WNL. Reproducible w/ resisted hand pronation and supination. Counter force strap. NSAIDs. Follow up w/ sports medicine   Chest Pain: highly atypical sxs in pt with minimal reported CV RFs apart from age and obesity. Reproducible anterior chest wall pain consistent with costochondritis. EKG NSR. CXR WNL. Preliminary HEART Score 1-2 (w/o trop). No active CP apart from chest wall palpation. Fits clinical picture of elbow pain given recent strenuous activity in setting of R sided chest reconstruction s/p mastectomy 20+ years ago. Will check general risk stratification labs at request of pt. Would consider outpt stress test. Discussed CV and general risk factors at length. Go to ER if pain worsens or morphology changes.    >60 minutes spent with pt in terms of direct patient care and/or care coordination   Shanda Howells, MD 04/21/14 1252  Shanda Howells, MD 04/21/14 1254

## 2014-04-21 NOTE — ED Notes (Signed)
Presents with report of right arm pain/discomfort/edema x 5 days; at work this morning began having sensation of chest pain. Taken to room immediately and Dr.Newton notified; EKG obtained.

## 2014-04-22 ENCOUNTER — Telehealth: Payer: Self-pay | Admitting: *Deleted

## 2014-05-17 NOTE — Addendum Note (Signed)
Addended by: Biagio Borg on: 05/17/2014 06:59 PM   Modules accepted: Miquel Dunn

## 2014-05-24 ENCOUNTER — Encounter: Payer: Self-pay | Admitting: Family Medicine

## 2014-05-24 ENCOUNTER — Ambulatory Visit (INDEPENDENT_AMBULATORY_CARE_PROVIDER_SITE_OTHER): Payer: BLUE CROSS/BLUE SHIELD | Admitting: Family Medicine

## 2014-05-24 VITALS — BP 118/80 | HR 89 | Temp 97.7°F | Ht 69.0 in | Wt 194.5 lb

## 2014-05-24 DIAGNOSIS — B9789 Other viral agents as the cause of diseases classified elsewhere: Principal | ICD-10-CM

## 2014-05-24 DIAGNOSIS — J069 Acute upper respiratory infection, unspecified: Secondary | ICD-10-CM

## 2014-05-24 MED ORDER — HYDROCODONE-HOMATROPINE 5-1.5 MG/5ML PO SYRP: 5.0000 mL | ORAL_SOLUTION | Freq: Four times a day (QID) | ORAL | Status: DC | PRN

## 2014-05-24 NOTE — Progress Notes (Signed)
Subjective:    Patient ID: Caitlin Greene, female    DOB: 09-19-1965, 49 y.o.   MRN: [id number]  HPI  Here with uri symptoms   Purulent nasal d/c and chest and head congestion  Body aches  Not coughing anything up yet   4 d of symptoms  No fever today  At home 99.9 at most --- takes aleve   Also taken otc zyrtec  No cough meds  Non smoker   Patient Active Problem List   Diagnosis Date Noted  . Dysuria 04/13/2014  . Chronic sinusitis 01/25/2012  . Vertigo 01/25/2012  . ETD (eustachian tube dysfunction) 10/01/2011  . Preventative health care 11/17/2010  . HEMATOCHEZIA 05/31/2010  . ANEMIA-IRON DEFICIENCY 10/11/2007  . GERD 10/11/2007  . HYPERLIPIDEMIA 01/21/2007  . ANXIETY 01/21/2007  . DEPRESSION 01/21/2007  . ALLERGIC RHINITIS 01/21/2007  . RENAL CALCULUS 01/21/2007  . BREAST CANCER, HX OF 01/21/2007   Past Medical History  Diagnosis Date  . BREAST CANCER, HX OF 01/21/2007    at 49yo  . ANXIETY   . DEPRESSION   . HYPERLIPIDEMIA   . ALLERGIC RHINITIS   . GERD   . RENAL CALCULUS   . ANEMIA-IRON DEFICIENCY    Past Surgical History  Procedure Laterality Date  . Mastectomy      right  . Tonsillectomy    . Breast enhancement surgery    . Temporomandibular joint surgery      Left   History  Substance Use Topics  . Smoking status: Never Smoker   . Smokeless tobacco: Never Used  . Alcohol Use: No   Family History  Problem Relation Age of Onset  . Colon polyps Mother   . Cancer Mother     Uterine Cancer  . Colon polyps Father   . Hypertension Other   . Diabetes Other    Allergies  Allergen Reactions  . Amoxicillin-Pot Clavulanate Hives  . Codeine Nausea And Vomiting    Needs pre-meds   Current Outpatient Prescriptions on File Prior to Visit  Medication Sig Dispense Refill  . cetirizine (ZYRTEC) 10 MG tablet Take 10 mg by mouth daily.    Marland Kitchen FLUoxetine (PROZAC) 20 MG capsule TAKE ONE CAPSULE BY MOUTH EVERY DAY 90 capsule 3  . FLUoxetine (PROZAC) 20  MG capsule TAKE ONE CAPSULE BY MOUTH EVERY DAY 90 capsule 3  . fluticasone (FLONASE) 50 MCG/ACT nasal spray Place 2 sprays into both nostrils daily. 16 g 11  . meclizine (ANTIVERT) 25 MG tablet Take 1 tablet (25 mg total) by mouth 3 (three) times daily as needed for dizziness. 30 tablet 2  . meloxicam (MOBIC) 15 MG tablet Take 1 tablet (15 mg total) by mouth daily. 30 tablet 1  . naproxen sodium (ANAPROX) 220 MG tablet Take 220 mg by mouth 2 (two) times daily as needed. For pain     No current facility-administered medications on file prior to visit.     Did get a flu shot this season    Review of Systems Review of Systems  Constitutional: Negative for , appetite change, and unexpected weight change. pos for fatigue and low grade temp ENT pos for cong and rhinorrhea and sinus pressure  Eyes: Negative for pain and visual disturbance.  Respiratory: Negative for wheeze and shortness of breath.   Cardiovascular: Negative for cp or palpitations    Gastrointestinal: Negative for nausea, diarrhea and constipation.  Genitourinary: Negative for urgency and frequency.  Skin: Negative for pallor or rash   Neurological:  Negative for weakness, light-headedness, numbness and headaches.  Hematological: Negative for adenopathy. Does not bruise/bleed easily.  Psychiatric/Behavioral: Negative for dysphoric mood. The patient is not nervous/anxious.         Objective:   Physical Exam  Constitutional: She appears well-developed and well-nourished. No distress.  overwt and well app  HENT:  Head: Normocephalic and atraumatic.  Right Ear: External ear normal.  Left Ear: External ear normal.  Mouth/Throat: Oropharynx is clear and moist. No oropharyngeal exudate.  Nares are injected and congested  Clear rhinorrhea No sinus tenderness    Eyes: Conjunctivae and EOM are normal. Pupils are equal, round, and reactive to light. Right eye exhibits no discharge. Left eye exhibits no discharge.  Neck: Normal  range of motion. Neck supple.  Cardiovascular: Normal rate, regular rhythm and normal heart sounds.   Pulmonary/Chest: Effort normal and breath sounds normal. No respiratory distress. She has no wheezes. She has no rales.  Lymphadenopathy:    She has no cervical adenopathy.  Neurological: She is alert.  Skin: Skin is warm and dry. No rash noted.  Psychiatric: She has a normal mood and affect.          Assessment & Plan:   Problem List Items Addressed This Visit      Respiratory   Viral URI with cough - Primary    Disc symptomatic care - see instructions on AVS  Hycodan for cough  Fluids/rest  Disc red flag symptoms for bact infx to watch for  Update if not starting to improve in a week or if worsening

## 2014-05-24 NOTE — Progress Notes (Signed)
Pre visit review using our clinic review tool, if applicable. No additional management support is needed unless otherwise documented below in the visit note. 

## 2014-05-24 NOTE — Patient Instructions (Signed)
You have a viral head and chest "cold"  Drink lots of fluids and rest  Try hycodan for cough -use with caution of sedation  If shortness of breath or wheezing please update If facial pain also let us know  Update if not starting to improve in a week or if worsening

## 2014-05-25 NOTE — Assessment & Plan Note (Signed)
Disc symptomatic care - see instructions on AVS  Hycodan for cough  Fluids/rest  Disc red flag symptoms for bact infx to watch for  Update if not starting to improve in a week or if worsening

## 2014-05-28 ENCOUNTER — Telehealth: Payer: Self-pay | Admitting: Internal Medicine

## 2014-05-28 NOTE — Telephone Encounter (Signed)
Patient informed of PCP instructions. 

## 2014-05-28 NOTE — Telephone Encounter (Signed)
Pt calling as she is feeling worse, pt was seen at the Saturday clinic. Tired, nausea and sore throat, pt given cough syrup at 1/16 just feels like she is getting worse. Pls advise  [phone] best #

## 2014-05-28 NOTE — Telephone Encounter (Signed)
often this means a viral illness, so if no worsening fever or pain, ok to watch for now, alleve or tylenol prn pain, mucinex OTC prn as well for congestion

## 2014-05-29 ENCOUNTER — Emergency Department
Admission: EM | Admit: 2014-05-29 | Discharge: 2014-05-29 | Disposition: A | Discharge status: Home / Self Care | Payer: BLUE CROSS/BLUE SHIELD | Source: Home / Self Care | Attending: Family Medicine | Admitting: Family Medicine

## 2014-05-29 ENCOUNTER — Emergency Department (INDEPENDENT_AMBULATORY_CARE_PROVIDER_SITE_OTHER): Payer: BLUE CROSS/BLUE SHIELD

## 2014-05-29 ENCOUNTER — Encounter: Payer: Self-pay | Admitting: Emergency Medicine

## 2014-05-29 DIAGNOSIS — R05 Cough: Secondary | ICD-10-CM

## 2014-05-29 DIAGNOSIS — J189 Pneumonia, unspecified organism: Secondary | ICD-10-CM | POA: Diagnosis not present

## 2014-05-29 DIAGNOSIS — R059 Cough, unspecified: Secondary | ICD-10-CM

## 2014-05-29 MED ORDER — HYDROCODONE-HOMATROPINE 5-1.5 MG/5ML PO SYRP: 5.0000 mL | ORAL_SOLUTION | Freq: Four times a day (QID) | ORAL | Status: DC | PRN

## 2014-05-29 MED ORDER — MOXIFLOXACIN HCL 400 MG PO TABS: 400.0000 mg | ORAL_TABLET | Freq: Every day | ORAL | Status: DC

## 2014-05-29 MED ORDER — IPRATROPIUM-ALBUTEROL 0.5-2.5 (3) MG/3ML IN SOLN
3.0000 mL | Freq: Once | RESPIRATORY_TRACT | Status: DC
  Administered 2014-05-29: 3 mL via RESPIRATORY_TRACT

## 2014-05-29 NOTE — Discharge Instructions (Signed)
Thank you for coming in today. Call or go to the emergency room if you get worse, have trouble breathing, have chest pains, or palpitations.   Pneumonia Pneumonia is an infection of the lungs.  CAUSES Pneumonia may be caused by bacteria or a virus. Usually, these infections are caused by breathing infectious particles into the lungs (respiratory tract). SIGNS AND SYMPTOMS   Cough.  Fever.  Chest pain.  Increased rate of breathing.  Wheezing.  Mucus production. DIAGNOSIS  If you have the common symptoms of pneumonia, your health care provider will typically confirm the diagnosis with a chest X-ray. The X-ray will show an abnormality in the lung (pulmonary infiltrate) if you have pneumonia. Other tests of your blood, urine, or sputum may be done to find the specific cause of your pneumonia. Your health care provider may also do tests (blood gases or pulse oximetry) to see how well your lungs are working. TREATMENT  Some forms of pneumonia may be spread to other people when you cough or sneeze. You may be asked to wear a mask before and during your exam. Pneumonia that is caused by bacteria is treated with antibiotic medicine. Pneumonia that is caused by the influenza virus may be treated with an antiviral medicine. Most other viral infections must run their course. These infections will not respond to antibiotics.  HOME CARE INSTRUCTIONS   Cough suppressants may be used if you are losing too much rest. However, coughing protects you by clearing your lungs. You should avoid using cough suppressants if you can.  Your health care provider may have prescribed medicine if he or she thinks your pneumonia is caused by bacteria or influenza. Finish your medicine even if you start to feel better.  Your health care provider may also prescribe an expectorant. This loosens the mucus to be coughed up.  Take medicines only as directed by your health care provider.  Do not smoke. Smoking is a common  cause of bronchitis and can contribute to pneumonia. If you are a smoker and continue to smoke, your cough may last several weeks after your pneumonia has cleared.  A cold steam vaporizer or humidifier in your room or home may help loosen mucus.  Coughing is often worse at night. Sleeping in a semi-upright position in a recliner or using a couple pillows under your head will help with this.  Get rest as you feel it is needed. Your body will usually let you know when you need to rest. PREVENTION A pneumococcal shot (vaccine) is available to prevent a common bacterial cause of pneumonia. This is usually suggested for:  People over 15 years old.  Patients on chemotherapy.  People with chronic lung problems, such as bronchitis or emphysema.  People with immune system problems. If you are over 65 or have a high risk condition, you may receive the pneumococcal vaccine if you have not received it before. In some countries, a routine influenza vaccine is also recommended. This vaccine can help prevent some cases of pneumonia.You may be offered the influenza vaccine as part of your care. If you smoke, it is time to quit. You may receive instructions on how to stop smoking. Your health care provider can provide medicines and counseling to help you quit. SEEK MEDICAL CARE IF: You have a fever. SEEK IMMEDIATE MEDICAL CARE IF:   Your illness becomes worse. This is especially true if you are elderly or weakened from any other disease.  You cannot control your cough with suppressants and  are losing sleep.  You begin coughing up blood.  You develop pain which is getting worse or is uncontrolled with medicines.  Any of the symptoms which initially brought you in for treatment are getting worse rather than better.  You develop shortness of breath or chest pain. MAKE SURE YOU:   Understand these instructions.  Will watch your condition.  Will get help right away if you are not doing well or get  worse. Document Released: 04/25/2005 Document Revised: 09/09/2013 Document Reviewed: 07/15/2010 [facility] Patient Information 2015 [place], [place]. This information is not intended to replace advice given to you by your health care provider. Make sure you discuss any questions you have with your health care provider.

## 2014-05-29 NOTE — ED Provider Notes (Signed)
Caitlin Greene is a 49 y.o. female who presents to Urgent Care today for cough. Patient has a one-week history of cough sore throats body aches night sweats with wheezing and shortness of breath. The cough is productive of yellow to greenish sputum. She's tried some Hycodan cough syrup which helps. No vomiting or diarrhea. She feels well otherwise.   Past Medical History  Diagnosis Date  . BREAST CANCER, HX OF 01/21/2007    at 49yo  . ANXIETY   . DEPRESSION   . HYPERLIPIDEMIA   . ALLERGIC RHINITIS   . GERD   . RENAL CALCULUS   . ANEMIA-IRON DEFICIENCY    Past Surgical History  Procedure Laterality Date  . Mastectomy      right  . Tonsillectomy    . Breast enhancement surgery    . Temporomandibular joint surgery      Left   History  Substance Use Topics  . Smoking status: Never Smoker   . Smokeless tobacco: Never Used  . Alcohol Use: No   ROS as above Medications: Current Facility-Administered Medications  Medication Dose Route Frequency Provider Last Rate Last Dose  . ipratropium-albuterol (DUONEB) 0.5-2.5 (3) MG/3ML nebulizer solution 3 mL  3 mL Nebulization Once Gregor Hams, MD       Current Outpatient Prescriptions  Medication Sig Dispense Refill  . cetirizine (ZYRTEC) 10 MG tablet Take 10 mg by mouth daily.    Marland Kitchen FLUoxetine (PROZAC) 20 MG capsule TAKE ONE CAPSULE BY MOUTH EVERY DAY 90 capsule 3  . FLUoxetine (PROZAC) 20 MG capsule TAKE ONE CAPSULE BY MOUTH EVERY DAY 90 capsule 3  . fluticasone (FLONASE) 50 MCG/ACT nasal spray Place 2 sprays into both nostrils daily. 16 g 11  . HYDROcodone-homatropine (HYCODAN) 5-1.5 MG/5ML syrup Take 5 mLs by mouth every 6 (six) hours as needed for cough. 120 mL 0  . moxifloxacin (AVELOX) 400 MG tablet Take 1 tablet (400 mg total) by mouth daily. 7 tablet 0  . naproxen sodium (ANAPROX) 220 MG tablet Take 220 mg by mouth 2 (two) times daily as needed. For pain     Allergies  Allergen Reactions  . Amoxicillin-Pot Clavulanate Hives  .  Codeine Nausea And Vomiting    Needs pre-meds     Exam:  BP 114/82 mmHg  Pulse 105  Temp(Src) 97.7 F (36.5 C) (Oral)  Ht 5\' 8"  (1.727 m)  Wt 190 lb (86.183 kg)  BMI 28.90 kg/m2  SpO2 96%  LMP 05/22/2014 Gen: Well NAD HEENT: EOMI,  MMM posterior pharynx is erythematous with cobblestoning. Normal tympanic membranes bilaterally. Lungs: Normal work of breathing. Coarse breath sounds left lung field Heart: RRR no MRG Abd: NABS, Soft. Nondistended, Nontender Exts: Brisk capillary refill, warm and well perfused.   Patient was given a 2.5/0.5 mg DuoNeb nebulizer treatment, and did not feel much better  No results found for this or any previous visit (from the past 24 hour(s)). Dg Chest 2 View  05/29/2014   CLINICAL DATA:  Cough and congestion for 5 days  EXAM: CHEST  2 VIEW  COMPARISON:  04/21/2014  FINDINGS: Cardiac shadow is within normal limits. The lungs are well aerated bilaterally but demonstrate evidence of a left lower lobe pneumonia. The right mastectomy is again identified. Increased density over the right lung base is secondary to residual postsurgical change and stable  IMPRESSION: Left lower lobe pneumonia.   Electronically Signed   By: Inez Catalina M.D.   On: 05/29/2014 09:55    Assessment and  Plan: 49 y.o. female with community-acquired pneumonia. Treat with Avelox and Hycodan cough syrup. Return as needed.  Discussed warning signs or symptoms. Please see discharge instructions. Patient expresses understanding.     Gregor Hams, MD 05/29/14 1019

## 2014-05-29 NOTE — ED Notes (Signed)
Productive cough, yellow-green sputum for 1 week

## 2014-06-03 ENCOUNTER — Telehealth: Payer: Self-pay | Admitting: *Deleted

## 2014-06-03 ENCOUNTER — Ambulatory Visit (INDEPENDENT_AMBULATORY_CARE_PROVIDER_SITE_OTHER): Payer: BLUE CROSS/BLUE SHIELD | Admitting: Internal Medicine

## 2014-06-03 ENCOUNTER — Encounter: Payer: Self-pay | Admitting: Internal Medicine

## 2014-06-03 VITALS — BP 120/82 | HR 80 | Temp 98.1°F | Ht 68.5 in | Wt 190.5 lb

## 2014-06-03 DIAGNOSIS — J181 Lobar pneumonia, unspecified organism: Secondary | ICD-10-CM

## 2014-06-03 DIAGNOSIS — R0600 Dyspnea, unspecified: Secondary | ICD-10-CM

## 2014-06-03 DIAGNOSIS — R071 Chest pain on breathing: Secondary | ICD-10-CM

## 2014-06-03 DIAGNOSIS — J189 Pneumonia, unspecified organism: Secondary | ICD-10-CM

## 2014-06-03 DIAGNOSIS — R079 Chest pain, unspecified: Secondary | ICD-10-CM | POA: Insufficient documentation

## 2014-06-03 NOTE — Progress Notes (Signed)
Pre visit review using our clinic review tool, if applicable. No additional management support is needed unless otherwise documented below in the visit note. 

## 2014-06-03 NOTE — Patient Instructions (Signed)
Please take all new medication as prescribed - the longer course avelox  Please continue all other medications as before, including the cough medicine  Please go to the LAB in the Basement (turn left off the elevator) for the tests to be done today  You will be contacted regarding the referral for: CT chest  Further recommendations would be after the CT results are known  You will be contacted by phone if any changes need to be made immediately.  Otherwise, you will receive a letter about your results with an explanation, but please check with MyChart first.

## 2014-06-03 NOTE — Progress Notes (Signed)
Subjective:    Patient ID: Caitlin Greene, female    DOB: 08-24-1965, 49 y.o.   MRN: [id number]  HPI  Here to f/u LLL pna/CAP, with some improvement initially with less fever, prod cough and chest congestion, sob on avelox now day #5/7 and cough med, but then with 1-2 days worsening new mild to mod pleuritic right ant cp with mild increased sob, feverish, and ongoing prod cough. Is s/p bilat breast implant s/p right breast ca and has not previously been a problem.  + hx of malignancy, but no hx of DVT/PE, abscess.   Decline ER eval tonight Past Medical History  Diagnosis Date  . BREAST CANCER, HX OF 01/21/2007    at 49yo  . ANXIETY   . DEPRESSION   . HYPERLIPIDEMIA   . ALLERGIC RHINITIS   . GERD   . RENAL CALCULUS   . ANEMIA-IRON DEFICIENCY    Past Surgical History  Procedure Laterality Date  . Mastectomy      right  . Tonsillectomy    . Breast enhancement surgery    . Temporomandibular joint surgery      Left    reports that she has never smoked. She has never used smokeless tobacco. She reports that she does not drink alcohol or use illicit drugs. family history includes Cancer in her mother; Colon polyps in her father and mother; Diabetes in her other; Hypertension in her other. Allergies  Allergen Reactions  . Amoxicillin-Pot Clavulanate Hives  . Codeine Nausea And Vomiting    Needs pre-meds   Current Outpatient Prescriptions on File Prior to Visit  Medication Sig Dispense Refill  . cetirizine (ZYRTEC) 10 MG tablet Take 10 mg by mouth daily.    Marland Kitchen FLUoxetine (PROZAC) 20 MG capsule TAKE ONE CAPSULE BY MOUTH EVERY DAY 90 capsule 3  . FLUoxetine (PROZAC) 20 MG capsule TAKE ONE CAPSULE BY MOUTH EVERY DAY 90 capsule 3  . fluticasone (FLONASE) 50 MCG/ACT nasal spray Place 2 sprays into both nostrils daily. 16 g 11  . HYDROcodone-homatropine (HYCODAN) 5-1.5 MG/5ML syrup Take 5 mLs by mouth every 6 (six) hours as needed for cough. 120 mL 0  . moxifloxacin (AVELOX) 400 MG tablet  Take 1 tablet (400 mg total) by mouth daily. 7 tablet 0  . naproxen sodium (ANAPROX) 220 MG tablet Take 220 mg by mouth 2 (two) times daily as needed. For pain     No current facility-administered medications on file prior to visit.    Review of Systems  Constitutional: Negative for unusual diaphoresis or other sweats  HENT: Negative for ringing in ear Eyes: Negative for double vision or worsening visual disturbance.  Respiratory: Negative for choking and stridor.   Gastrointestinal: Negative for vomiting or other signifcant bowel change Genitourinary: Negative for hematuria or decreased urine volume.  Musculoskeletal: Negative for other MSK pain or swelling Skin: Negative for color change and worsening wound.  Neurological: Negative for tremors and numbness other than noted  Psychiatric/Behavioral: Negative for decreased concentration or agitation other than above       Objective:   Physical Exam BP 120/82 mmHg  Pulse 80  Temp(Src) 98.1 F (36.7 C) (Oral)  Ht 5' 8.5" (1.74 m)  Wt 190 lb 8 oz (86.41 kg)  BMI 28.54 kg/m2  SpO2 95%  LMP 05/22/2014 VS noted, mild ill Constitutional: Pt appears well-developed, well-nourished.  HENT: Head: NCAT.  Right Ear: External ear normal.  Left Ear: External ear normal.  Eyes: . Pupils are equal, round, and  reactive to light. Conjunctivae and EOM are normal Neck: Normal range of motion. Neck supple.  Cardiovascular: Normal rate and regular rhythm.   Pulmonary/Chest: Effort normal with decreased right breath sounds without  Wheezing, also few LLL rales noted.  Abd:  Soft, NT, ND, + BS Neurological: Pt is alert. Not confused , motor grossly intact Skin: Skin is warm. No rash or other skin change right ant chest Psychiatric: Pt behavior is normal. No agitation.     Assessment & Plan:

## 2014-06-04 ENCOUNTER — Ambulatory Visit (INDEPENDENT_AMBULATORY_CARE_PROVIDER_SITE_OTHER)
Admission: RE | Admit: 2014-06-04 | Discharge: 2014-06-04 | Disposition: A | Discharge status: Home / Self Care | Payer: BLUE CROSS/BLUE SHIELD | Source: Ambulatory Visit | Attending: Internal Medicine | Admitting: Internal Medicine

## 2014-06-04 ENCOUNTER — Other Ambulatory Visit (INDEPENDENT_AMBULATORY_CARE_PROVIDER_SITE_OTHER): Payer: BLUE CROSS/BLUE SHIELD

## 2014-06-04 DIAGNOSIS — J181 Lobar pneumonia, unspecified organism: Secondary | ICD-10-CM

## 2014-06-04 DIAGNOSIS — R071 Chest pain on breathing: Secondary | ICD-10-CM

## 2014-06-04 DIAGNOSIS — R0600 Dyspnea, unspecified: Secondary | ICD-10-CM

## 2014-06-04 DIAGNOSIS — J189 Pneumonia, unspecified organism: Secondary | ICD-10-CM

## 2014-06-04 LAB — BASIC METABOLIC PANEL
BUN: 19 mg/dL (ref 6–23)
CO2: 26 mEq/L (ref 19–32)
Calcium: 9.3 mg/dL (ref 8.4–10.5)
Chloride: 104 mEq/L (ref 96–112)
Creatinine, Ser: 0.91 mg/dL (ref 0.40–1.20)
GFR: 69.44 mL/min (ref >60.00)
Glucose, Bld: 88 mg/dL (ref 70–99)
Potassium: 3.6 mEq/L (ref 3.5–5.1)
Sodium: 138 mEq/L (ref 135–145)

## 2014-06-04 LAB — CBC WITH DIFFERENTIAL/PLATELET
Basophils Absolute: 0 10*3/uL (ref 0.0–0.1)
Basophils Relative: 0.5 % (ref 0.0–3.0)
Eosinophils Absolute: 0.1 10*3/uL (ref 0.0–0.7)
Eosinophils Relative: 1.3 % (ref 0.0–5.0)
HCT: 40.6 % (ref 36.0–46.0)
Hemoglobin: 14.1 g/dL (ref 12.0–15.0)
Lymphocytes Relative: 20.9 % (ref 12.0–46.0)
Lymphs Abs: 1.9 10*3/uL (ref 0.7–4.0)
MCHC: 34.6 g/dL (ref 30.0–36.0)
MCV: 87.9 fl (ref 78.0–100.0)
Monocytes Absolute: 0.7 10*3/uL (ref 0.1–1.0)
Monocytes Relative: 7.2 % (ref 3.0–12.0)
Neutro Abs: 6.4 10*3/uL (ref 1.4–7.7)
Neutrophils Relative %: 70.1 % (ref 43.0–77.0)
Platelets: 316 10*3/uL (ref 150.0–400.0)
RBC: 4.62 Mil/uL (ref 3.87–5.11)
RDW: 14 % (ref 11.5–15.5)
WBC: 9.1 10*3/uL (ref 4.0–10.5)

## 2014-06-04 MED ORDER — IOHEXOL 350 MG/ML SOLN
80.0000 mL | Freq: Once | INTRAVENOUS | Status: AC | PRN
  Administered 2014-06-04: 80 mL via INTRAVENOUS

## 2014-06-04 NOTE — Assessment & Plan Note (Signed)
New right ant pleuritic CP with persistent sob, prod cough, feverish -- ? MSK, cant ro other such as PE - for CTA r/o worsening pna or PE

## 2014-06-04 NOTE — Assessment & Plan Note (Addendum)
Cont antibiotic as above, also lab eval in AM with cbc

## 2014-06-04 NOTE — Assessment & Plan Note (Signed)
No wheezing, sat ok, for cont'd treatment except avelox extended to total 10 days

## 2014-06-05 ENCOUNTER — Ambulatory Visit (INDEPENDENT_AMBULATORY_CARE_PROVIDER_SITE_OTHER): Payer: BLUE CROSS/BLUE SHIELD | Admitting: Internal Medicine

## 2014-06-05 ENCOUNTER — Encounter: Payer: Self-pay | Admitting: Internal Medicine

## 2014-06-05 VITALS — BP 112/80 | HR 122 | Ht 68.75 in | Wt 193.0 lb

## 2014-06-05 DIAGNOSIS — J189 Pneumonia, unspecified organism: Secondary | ICD-10-CM

## 2014-06-05 DIAGNOSIS — J329 Chronic sinusitis, unspecified: Secondary | ICD-10-CM

## 2014-06-05 DIAGNOSIS — J181 Lobar pneumonia, unspecified organism: Principal | ICD-10-CM

## 2014-06-05 NOTE — Assessment & Plan Note (Signed)
Clinically this is a LLL pneumonia given the acute onset/ febrile illness, green sputum all improving clinically though I share Dr Gwynn Burly and radiology's concern there may be more to it than that with definite localized rhonchi on L and will need close f/u to be sure this clears over the next few weeks or will need fob.  She has never smoked and has no chronic dz to suggest she has late metastatic ca but it is notorious amongst the rare tumors that metastasize to the airway so will keep this in mind

## 2014-06-05 NOTE — Patient Instructions (Addendum)
Please see patient coordinator before you leave today  to schedule sinus CT on Feb 1st and follow up up then here with cxr same day   Finish your antibiotics - this has all the features of an acute  infection and we need to let the antibiotics do their job before further studies  Late add:  Needs  pneumovax next ov

## 2014-06-05 NOTE — Progress Notes (Signed)
Subjective:     Patient ID: Caitlin Greene, female   DOB: [birth year], 49 y.o.   MRN: [id number]  HPI  73 yowf never smoker with breast ca 1988 on Right  no adjuvant therapy and starting around 2010 developed pattern of recurrent nasal obstr and green mucus production  4-5 x per year including Dec 2015 100% better p abx  Then again  aburptly ill 05/23/14  Aching/ fever  and seen on 05/24/14 rx as virus  with cough meds then 05/27/14 started green mucus, streaked with blood and 05/29/14 rx avelox and then 06/03/14 pain on breathing slt to R of midline and rec complete 10 day of avelox and CT chest and referred to pulmonary  06/05/2014 to Dr Jenny Greene with ? L hilar mass.   06/05/2014 1st [facility] office visit/ Caitlin Greene   Chief Complaint  Patient presents with  . Pulmonary Consult    Referred by Caitlin Greene. Pt c/o cough, SOB, and CP for the past 8 days.   feeling much better now, no more fever, did have some "smelly mucus" but nothing putrid and no more hemoptysis, no lateralizing pleurtic cp (pain was alwary to R of midline anteriorly.  Breathing better but not back to baseline  No obvious patterns in  day to day or daytime variabilty or assoc   chest tightness, subjective wheeze current  overt sinus or hb symptoms. No unusual exp hx or h/o childhood pna/ asthma or knowledge of premature birth.  Sleeping ok without nocturnal  or early am exacerbation  of respiratory  c/o's or need for noct saba. Also denies any obvious fluctuation of symptoms with weather or environmental changes or other aggravating or alleviating factors except as outlined above   Current Medications, Allergies, Complete Past Medical History, Past Surgical History, Family History, and Social History were reviewed in [employer] record.  ROS  The following are not active complaints unless bolded sore throat, dysphagia, dental problems, itching, sneezing,  nasal congestion or excess/ purulent secretions, ear  ache,   fever, chills, sweats, unintended wt loss, pleuritic or exertional cp, hemoptysis,  orthopnea pnd or leg swelling, presyncope, palpitations, heartburn, abdominal pain, anorexia, nausea, vomiting, diarrhea  or change in bowel or urinary habits, change in stools or urine, dysuria,hematuria,  rash, arthralgias, visual complaints, headache, numbness weakness or ataxia or problems with walking or coordination,  change in mood/affect or memory.         Review of Systems     Objective:   Physical Exam amb wf nad  Wt Readings from Last 3 Encounters:  06/05/14 193 lb (87.544 kg)  06/03/14 190 lb 8 oz (86.41 kg)  05/29/14 190 lb (86.183 kg)    Vital signs reviewed   HEENT: nl dentition, turbinates, and orophanx. Nl external ear canals without cough reflex   NECK :  without JVD/Nodes/TM/ nl carotid upstrokes bilaterally   LUNGS: no acc muscle use,  Minimal insp / exp rhonchi LLL   CV:  RRR  no s3 or murmur or increase in P2, no edema   ABD:  soft and nontender with nl excursion in the supine position. No bruits or organomegaly, bowel sounds nl  MS:  warm without deformities, calf tenderness, cyanosis or clubbing  SKIN: warm and dry without lesions    NEURO:  alert, approp, no deficits    CXR:  06/04/14 I personally reviewed images and agree with radiology impression as follows:   Abnormal soft tissue in the inferior aspect of  the left hilum constricting pulmonary arteries and bronchi creating moderate left lower lobe atelectasis. The possibility of an infiltrating mass in the left hilum should be considered.     Assessment:

## 2014-06-05 NOTE — Assessment & Plan Note (Signed)
Concerned about the frequency of these infections and overuse of abx repeatedly so ? Is there a partial obst or other reason for the recurrent infections > will do CT sinus p completes 10 d course of avelox for the LLL pna  Also needs pneumovax/ prevnar

## 2014-06-06 ENCOUNTER — Telehealth: Payer: Self-pay | Admitting: Internal Medicine

## 2014-06-06 MED ORDER — MOXIFLOXACIN HCL 400 MG PO TABS: 400.0000 mg | ORAL_TABLET | Freq: Every day | ORAL | Status: DC

## 2014-06-06 NOTE — Telephone Encounter (Signed)
Did notify patient script done. Left message on her cell and work number.

## 2014-06-06 NOTE — Telephone Encounter (Signed)
Needs avelox sent to [facility] in [place].  Pharmacy did not receive and patient states she does not have in her papers.

## 2014-06-06 NOTE — Telephone Encounter (Signed)
Sent in Avelox, it was printed per previous prescription note so sent in electronically.

## 2014-06-09 ENCOUNTER — Ambulatory Visit (INDEPENDENT_AMBULATORY_CARE_PROVIDER_SITE_OTHER): Payer: BLUE CROSS/BLUE SHIELD | Admitting: Internal Medicine

## 2014-06-09 ENCOUNTER — Ambulatory Visit (INDEPENDENT_AMBULATORY_CARE_PROVIDER_SITE_OTHER)
Admission: RE | Admit: 2014-06-09 | Discharge: 2014-06-09 | Disposition: A | Discharge status: Home / Self Care | Payer: BLUE CROSS/BLUE SHIELD | Source: Ambulatory Visit | Attending: Internal Medicine | Admitting: Internal Medicine

## 2014-06-09 ENCOUNTER — Encounter: Payer: Self-pay | Admitting: Internal Medicine

## 2014-06-09 VITALS — BP 110/78 | HR 101 | Temp 97.9°F | Ht 68.75 in | Wt 194.8 lb

## 2014-06-09 DIAGNOSIS — J189 Pneumonia, unspecified organism: Secondary | ICD-10-CM

## 2014-06-09 DIAGNOSIS — R0600 Dyspnea, unspecified: Secondary | ICD-10-CM

## 2014-06-09 DIAGNOSIS — J181 Lobar pneumonia, unspecified organism: Principal | ICD-10-CM

## 2014-06-09 DIAGNOSIS — J329 Chronic sinusitis, unspecified: Secondary | ICD-10-CM

## 2014-06-09 NOTE — Progress Notes (Signed)
Subjective:     Patient ID: Caitlin Greene, female   DOB: Sep 02, 1965, 49 y.o.   MRN: [id number]  HPI  35 yowf never smoker with breast ca 1988 on Right  no adjuvant therapy and starting around 2010 developed pattern of recurrent nasal obstr and green mucus production  4-5 x per year including Dec 2015 100% better p abx  Then again  aburptly ill 05/23/14  Aching/ fever  and seen on 05/24/14 rx as virus  with cough meds then 05/27/14 started green mucus, streaked with blood and 05/29/14 rx avelox and then 06/03/14 pain on breathing slt to R of midline and rec complete 10 day of avelox and CT chest and referred to pulmonary  06/05/2014 to Dr Jenny Reichmann with ? L hilar mass.   06/05/2014 1st [facility] office visit/ Caitlin Greene   Chief Complaint  Patient presents with  . Pulmonary Consult    Referred by Dr. Cathlean Cower. Pt c/o cough, SOB, and CP for the past 8 days.   feeling much better now, no more fever, did have some "smelly mucus" but nothing putrid and no more hemoptysis, no lateralizing pleurtic cp (pain was alwary to R of midline anteriorly.  Breathing better but not back to baseline rec Please see patient coordinator before you leave today  to schedule sinus CT on Feb 1st and follow up up then here with cxr same day  [facility] your antibiotics - this has all the features of an acute  infection and we need to let the antibiotics do their job before further studies       06/09/2014 f/u ov/Caitlin Greene re: CAP still  On abx #10  avelox   Chief Complaint  Patient presents with  . Follow-up    Pt states that breathing has been okay since last OV-- reports having dry cough, unable to get mucus up. Pt had CT and CXR today.       No obvious patterns in  day to day or daytime variabilty or assoc sob  chest tightness, subjective wheeze current  overt sinus or hb symptoms. No unusual exp hx or h/o childhood pna/ asthma or knowledge of premature birth.  Sleeping ok without nocturnal  or early am exacerbation  of  respiratory  c/o's or need for noct saba. Also denies any obvious fluctuation of symptoms with weather or environmental changes or other aggravating or alleviating factors except as outlined above   Current Medications, Allergies, Complete Past Medical History, Past Surgical History, Family History, and Social History were reviewed in [employer] record.  ROS  The following are not active complaints unless bolded sore throat, dysphagia, dental problems, itching, sneezing,  nasal congestion or excess/ purulent secretions, ear ache,   fever, chills, sweats, unintended wt loss, pleuritic or exertional cp, hemoptysis,  orthopnea pnd or leg swelling, presyncope, palpitations, heartburn, abdominal pain, anorexia, nausea, vomiting, diarrhea  or change in bowel or urinary habits, change in stools or urine, dysuria,hematuria,  rash, arthralgias, visual complaints, headache, numbness weakness or ataxia or problems with walking or coordination,  change in mood/affect or memory.              Objective:   Physical Exam amb wf nad  06/09/2014         195 Wt Readings from Last 3 Encounters:  06/05/14 193 lb (87.544 kg)  06/03/14 190 lb 8 oz (86.41 kg)  05/29/14 190 lb (86.183 kg)    Vital signs reviewed   HEENT: nl dentition, turbinates,  and orophanx. Nl external ear canals without cough reflex   NECK :  without JVD/Nodes/TM/ nl carotid upstrokes bilaterally   LUNGS: no acc muscle use,  Minimal insp / exp rhonchi LLL   CV:  RRR  no s3 or murmur or increase in P2, no edema   ABD:  soft and nontender with nl excursion in the supine position. No bruits or organomegaly, bowel sounds nl  MS:  warm without deformities, calf tenderness, cyanosis or clubbing  SKIN: warm and dry without lesions    NEURO:  alert, approp, no deficits     CXR PA and Lateral:   06/09/2014 :     I personally reviewed images and agree with radiology impression as follows:    Persistent atelectasis  in left lower lobe retrocardiac best seen on lateral view. Again left hilar mass cannot be excluded. No new infiltrate or pulmonary edema The area seen on lateral is much smaller ? Due to atx or improvement hard to be sure     Assessment:

## 2014-06-09 NOTE — Patient Instructions (Addendum)
No more antibiotics for now  For cough take mucinex or mucinex dm 1200  Mg every 12 hours as needed and add the hydrocodone cough syrup only at bedtime if needed   Please schedule a follow up office visit in 2 weeks, sooner if needed with cxr on return and restart antibiotics if condition worsened

## 2014-06-10 ENCOUNTER — Encounter: Payer: Self-pay | Admitting: Internal Medicine

## 2014-06-10 NOTE — Assessment & Plan Note (Signed)
Clinically much better p 10 days of avelox with no more signs of infection at this point and issue is whether the density on lateral is a lower volume due to resp to rx or vol loss to atx   No rush waiting for "cxr lag" and if not improved next ov will rec fob at f/u/ also needs pneuomovax

## 2014-06-10 NOTE — Assessment & Plan Note (Signed)
Resolved

## 2014-06-10 NOTE — Progress Notes (Signed)
Quick Note:  LMTCB ______ 

## 2014-06-19 NOTE — Progress Notes (Signed)
Quick Note:  lmtcb for pt. ______ 

## 2014-06-20 ENCOUNTER — Telehealth: Payer: Self-pay | Admitting: Internal Medicine

## 2014-06-20 NOTE — Telephone Encounter (Signed)
[phone], patient Caitlin Greene

## 2014-06-20 NOTE — Telephone Encounter (Signed)
Notes Recorded by Tanda Rockers, MD on 06/09/2014 at 1:27 PM Call patient : Study is unremarkable, no change in recs  -------------------------------------------------  lmtcb X1 for pt.

## 2014-06-20 NOTE — Telephone Encounter (Signed)
Pt aware of results 

## 2014-06-23 ENCOUNTER — Ambulatory Visit: Payer: BLUE CROSS/BLUE SHIELD | Admitting: Internal Medicine

## 2014-06-30 ENCOUNTER — Ambulatory Visit: Payer: BLUE CROSS/BLUE SHIELD | Admitting: Internal Medicine

## 2014-07-08 ENCOUNTER — Encounter: Payer: Self-pay | Admitting: Internal Medicine

## 2014-07-08 ENCOUNTER — Ambulatory Visit (INDEPENDENT_AMBULATORY_CARE_PROVIDER_SITE_OTHER)
Admission: RE | Admit: 2014-07-08 | Discharge: 2014-07-08 | Disposition: A | Discharge status: Home / Self Care | Payer: BLUE CROSS/BLUE SHIELD | Source: Ambulatory Visit | Attending: Internal Medicine | Admitting: Internal Medicine

## 2014-07-08 ENCOUNTER — Ambulatory Visit (INDEPENDENT_AMBULATORY_CARE_PROVIDER_SITE_OTHER): Payer: BLUE CROSS/BLUE SHIELD | Admitting: Internal Medicine

## 2014-07-08 VITALS — BP 126/74 | HR 78 | Ht 68.75 in | Wt 194.0 lb

## 2014-07-08 DIAGNOSIS — J181 Lobar pneumonia, unspecified organism: Principal | ICD-10-CM

## 2014-07-08 DIAGNOSIS — J189 Pneumonia, unspecified organism: Secondary | ICD-10-CM

## 2014-07-08 DIAGNOSIS — Z23 Encounter for immunization: Secondary | ICD-10-CM

## 2014-07-08 NOTE — Progress Notes (Signed)
Subjective:     Patient ID: Caitlin Greene, female   DOB: [birth year], 49 y.o.   MRN: [id number]    Brief patient profile:  33 yowf never smoker with breast ca 1988 on Right  no adjuvant therapy and starting around 2010 developed pattern of recurrent nasal obstr and green mucus production  4-5 x per year including Dec 2015 100% better p abx  Then again  aburptly ill 05/23/14  Aching/ fever  and seen on 05/24/14 rx as virus  with cough meds then 05/27/14 started green mucus, streaked with blood and 05/29/14 rx avelox and then 06/03/14 pain on breathing slt to R of midline and rec complete 10 day of avelox and CT chest and referred to pulmonary  06/05/2014 to Dr Jenny Reichmann with ? L hilar mass.   History of Present Illness  06/05/2014 1st [facility] office visit/ Caitlin Greene   Chief Complaint  Patient presents with  . Pulmonary Consult    Referred by Dr. Cathlean Cower. Pt c/o cough, SOB, and CP for the past 8 days.   feeling much better now, no more fever, did have some "smelly mucus" but nothing putrid and no more hemoptysis, no lateralizing pleurtic cp (pain was alwary to R of midline anteriorly.  Breathing better but not back to baseline rec Please see patient coordinator before you leave today  to schedule sinus CT on Feb 1st and follow up up then here with cxr same day  [facility] your antibiotics - this has all the features of an acute  infection and we need to let the antibiotics do their job before further studies       06/09/2014 f/u ov/Caitlin Greene re: CAP still  On abx #10  avelox   Chief Complaint  Patient presents with  . Follow-up    Pt states that breathing has been okay since last OV-- reports having dry cough, unable to get mucus up. Pt had CT and CXR today.   rec No more antibiotics for now For cough take mucinex or mucinex dm 1200  Mg every 12 hours as needed and add the hydrocodone cough syrup only at bedtime if needed    07/08/2014 f/u ov/Caitlin Greene re: f/u CAP  Chief Complaint  Patient presents with  .  Follow-up    CXR was done today. Pt states that her cough has resolved and her breathing is back to her normal baseline. No new co's today.    Not limited by breathing from desired activities  / no need for pulmonary meds/ cough suppression at all    No obvious patterns in  day to day or daytime variabilty or assoc cough  chest tightness, subjective wheeze current  overt sinus or hb symptoms. No unusual exp hx or h/o childhood pna/ asthma or knowledge of premature birth.  Sleeping ok without nocturnal  or early am exacerbation  of respiratory  c/o's or need for noct saba. Also denies any obvious fluctuation of symptoms with weather or environmental changes or other aggravating or alleviating factors except as outlined above   Current Medications, Allergies, Complete Past Medical History, Past Surgical History, Family History, and Social History were reviewed in [employer] record.  ROS  The following are not active complaints unless bolded sore throat, dysphagia, dental problems, itching, sneezing,  nasal congestion or excess/ purulent secretions, ear ache,   fever, chills, sweats, unintended wt loss, pleuritic or exertional cp, hemoptysis,  orthopnea pnd or leg swelling, presyncope, palpitations, heartburn, abdominal pain, anorexia, nausea, vomiting,  diarrhea  or change in bowel or urinary habits, change in stools or urine, dysuria,hematuria,  rash, arthralgias, visual complaints, headache, numbness weakness or ataxia or problems with walking or coordination,  change in mood/affect or memory.              Objective:   Physical Exam amb wf nad  06/09/2014         195> 07/08/2014    194  Wt Readings from Last 3 Encounters:  06/05/14 193 lb (87.544 kg)  06/03/14 190 lb 8 oz (86.41 kg)  05/29/14 190 lb (86.183 kg)    Vital signs reviewed   HEENT: nl dentition, turbinates, and orophanx. Nl external ear canals without cough reflex   NECK :  without JVD/Nodes/TM/ nl  carotid upstrokes bilaterally   LUNGS: no acc muscle use,  Completely clear LLL, good air movement    CV:  RRR  no s3 or murmur or increase in P2, no edema   ABD:  soft and nontender with nl excursion in the supine position. No bruits or organomegaly, bowel sounds nl  MS:  warm without deformities, calf tenderness, cyanosis or clubbing  SKIN: warm and dry without lesions        CXR PA and Lateral:   07/08/2014 :     I personally reviewed images and agree with radiology impression as follows:    There has been interval clearing of the left lower lobe pneumonia. No active cardiopulmonary disease is demonstrated today.         Assessment:

## 2014-07-08 NOTE — Assessment & Plan Note (Signed)
Completed avelox 06/09/14  - completely resolved 07/08/2014 > no further f/u needed    Summary discussion/ review of xrays > clearly this was a  CAP with secondary hilar adenopathy and not a central obstructing tumor  I did rec prevnar now and pneumovax in one year to reduce risk of recurrent pna  Pulmonary f/u can be prn

## 2014-07-08 NOTE — Patient Instructions (Signed)
The area has cleared by cxr and no further studies are needed unless you have recurrent pneumonia  I recommend Prevnar 13 today and then in one year Pneumovax to reduce risk of recurrent pneumonia

## 2014-07-30 ENCOUNTER — Ambulatory Visit (INDEPENDENT_AMBULATORY_CARE_PROVIDER_SITE_OTHER): Payer: BLUE CROSS/BLUE SHIELD | Admitting: Internal Medicine

## 2014-07-30 ENCOUNTER — Encounter: Payer: Self-pay | Admitting: Internal Medicine

## 2014-07-30 VITALS — BP 118/80 | HR 74 | Temp 97.5°F | Resp 18 | Ht 68.75 in | Wt 193.1 lb

## 2014-07-30 DIAGNOSIS — F32A Depression, unspecified: Secondary | ICD-10-CM

## 2014-07-30 DIAGNOSIS — J019 Acute sinusitis, unspecified: Secondary | ICD-10-CM | POA: Insufficient documentation

## 2014-07-30 DIAGNOSIS — J309 Allergic rhinitis, unspecified: Secondary | ICD-10-CM

## 2014-07-30 DIAGNOSIS — F329 Major depressive disorder, single episode, unspecified: Secondary | ICD-10-CM

## 2014-07-30 MED ORDER — AZITHROMYCIN 250 MG PO TABS: ORAL_TABLET | ORAL | Status: DC

## 2014-07-30 NOTE — Assessment & Plan Note (Signed)
stable overall by history and exam, and pt to continue medical treatment as before,  to f/u any worsening symptoms or concerns 

## 2014-07-30 NOTE — Assessment & Plan Note (Signed)
Mild to mod, for antibx course,  to f/u any worsening symptoms or concerns 

## 2014-07-30 NOTE — Assessment & Plan Note (Signed)
For med restart,  to f/u any worsening symptoms or concerns

## 2014-07-30 NOTE — Progress Notes (Signed)
Subjective:    Patient ID: Caitlin Greene, female    DOB: November 24, 1965, 49 y.o.   MRN: [id number]  HPI   Here with 2-3 days acute onset fever, facial pain, pressure, headache, general weakness and malaise, and greenish d/c, with mild ST and cough, but pt denies chest pain, wheezing, increased sob or doe, orthopnea, PND, increased LE swelling, palpitations, dizziness or syncope.  Does have several wks ongoing nasal allergy symptoms with clearish congestion, itch and sneezing, without fever, pain, ST, cough, swelling or wheezing.   Pt denies polydipsia, polyuria,  Denies worsening depressive symptoms, suicidal ideation, or panic Past Medical History  Diagnosis Date  . BREAST CANCER, HX OF 01/21/2007    at 49yo  . ANXIETY   . DEPRESSION   . HYPERLIPIDEMIA   . ALLERGIC RHINITIS   . GERD   . RENAL CALCULUS   . ANEMIA-IRON DEFICIENCY    Past Surgical History  Procedure Laterality Date  . Mastectomy      right  . Tonsillectomy    . Breast enhancement surgery    . Temporomandibular joint surgery      Left    reports that she has never smoked. She has never used smokeless tobacco. She reports that she does not drink alcohol or use illicit drugs. family history includes Asthma in her sister; COPD in her father; Cancer in her mother; Colon polyps in her father and mother; Diabetes in her other; Hypertension in her other. Allergies  Allergen Reactions  . Amoxicillin-Pot Clavulanate Hives  . Codeine Nausea And Vomiting    Needs pre-meds   Current Outpatient Prescriptions on File Prior to Visit  Medication Sig Dispense Refill  . cetirizine (ZYRTEC) 10 MG tablet Take 10 mg by mouth daily.    Marland Kitchen FLUoxetine (PROZAC) 20 MG capsule TAKE ONE CAPSULE BY MOUTH EVERY DAY 90 capsule 3  . fluticasone (FLONASE) 50 MCG/ACT nasal spray Place 2 sprays into both nostrils daily. 16 g 11  . naproxen sodium (ANAPROX) 220 MG tablet Take 220 mg by mouth 2 (two) times daily as needed. For pain     No current  facility-administered medications on file prior to visit.    Review of Systems  Constitutional: Negative for unusual diaphoresis or night sweats HENT: Negative for ringing in ear or discharge Eyes: Negative for double vision or worsening visual disturbance.  Respiratory: Negative for choking and stridor.   Gastrointestinal: Negative for vomiting or other signifcant bowel change Genitourinary: Negative for hematuria or change in urine volume.  Musculoskeletal: Negative for other MSK pain or swelling Skin: Negative for color change and worsening wound.  Neurological: Negative for tremors and numbness other than noted  Psychiatric/Behavioral: Negative for decreased concentration or agitation other than above  '    Objective:   Physical Exam BP 118/80 mmHg  Pulse 74  Temp(Src) 97.5 F (36.4 C) (Oral)  Resp 18  Ht 5' 8.75" (1.746 m)  Wt 193 lb 1.3 oz (87.581 kg)  BMI 28.73 kg/m2  SpO2 98%  LMP 07/02/2014 VS noted, mild ill Constitutional: Pt appears in no significant distress HENT: Head: NCAT.  Right Ear: External ear normal.  Left Ear: External ear normal.  Bilat tm's with mild erythema.  Max sinus areas mild tender.  Pharynx with mild erythema, no exudate Eyes: . Pupils are equal, round, and reactive to light. Conjunctivae and EOM are normal Neck: Normal range of motion. Neck supple.  Cardiovascular: Normal rate and regular rhythm.   Pulmonary/Chest: Effort normal and breath  sounds without rales or wheezing.  Abd:  Soft, NT, ND, + BS Neurological: Pt is alert. Not confused , motor grossly intact Skin: Skin is warm. No rash, no LE edema Psychiatric: Pt behavior is normal. No agitation. not depressed affect        Assessment & Plan:

## 2014-07-30 NOTE — Patient Instructions (Signed)
Please take all new medication as prescribed - the antibiotic  Please continue all other medications as before, and refills have been done if requested.  Please have the pharmacy call with any other refills you may need.  Please continue your efforts at being more active, low cholesterol diet, and weight control.  Please keep your appointments with your specialists as you may have planned    

## 2014-10-31 ENCOUNTER — Encounter: Payer: Self-pay | Admitting: Gastroenterology

## 2014-11-04 ENCOUNTER — Ambulatory Visit (INDEPENDENT_AMBULATORY_CARE_PROVIDER_SITE_OTHER): Payer: BLUE CROSS/BLUE SHIELD | Admitting: Internal Medicine

## 2014-11-04 ENCOUNTER — Encounter: Payer: Self-pay | Admitting: Internal Medicine

## 2014-11-04 VITALS — BP 118/76 | HR 86 | Temp 98.0°F | Ht 69.0 in | Wt 198.0 lb

## 2014-11-04 DIAGNOSIS — J019 Acute sinusitis, unspecified: Secondary | ICD-10-CM

## 2014-11-04 MED ORDER — SULFAMETHOXAZOLE-TRIMETHOPRIM 800-160 MG PO TABS: 1.0000 | ORAL_TABLET | Freq: Two times a day (BID) | ORAL | Status: DC

## 2014-11-04 NOTE — Assessment & Plan Note (Signed)
Mild to mod, for antibx course,  to f/u any worsening symptoms or concerns 

## 2014-11-04 NOTE — Patient Instructions (Signed)
Please take all new medication as prescribed  Please continue all other medications as before, and refills have been done if requested.  Please have the pharmacy call with any other refills you may need.  Please keep your appointments with your specialists as you may have planned     

## 2014-11-04 NOTE — Progress Notes (Signed)
   Subjective:    Patient ID: Caitlin Greene, female    DOB: 01/19/1966, 49 y.o.   MRN: [id number]  HPI   Here with 2-3 days acute onset fever, facial pain, pressure, headache, general weakness and malaise, and greenish d/c, with mild ST and cough, but pt denies chest pain, wheezing, increased sob or doe, orthopnea, PND, increased LE swelling, palpitations, dizziness or syncope.   Past Medical History  Diagnosis Date  . BREAST CANCER, HX OF 01/21/2007    at 49yo  . ANXIETY   . DEPRESSION   . HYPERLIPIDEMIA   . ALLERGIC RHINITIS   . GERD   . RENAL CALCULUS   . ANEMIA-IRON DEFICIENCY    Past Surgical History  Procedure Laterality Date  . Mastectomy      right  . Tonsillectomy    . Breast enhancement surgery    . Temporomandibular joint surgery      Left    reports that she has never smoked. She has never used smokeless tobacco. She reports that she does not drink alcohol or use illicit drugs. family history includes Asthma in her sister; COPD in her father; Cancer in her mother; Colon polyps in her father and mother; Diabetes in her other; Hypertension in her other. Allergies  Allergen Reactions  . Amoxicillin-Pot Clavulanate Hives  . Codeine Nausea And Vomiting    Needs pre-meds   Current Outpatient Prescriptions on File Prior to Visit  Medication Sig Dispense Refill  . cetirizine (ZYRTEC) 10 MG tablet Take 10 mg by mouth daily.    Marland Kitchen FLUoxetine (PROZAC) 20 MG capsule TAKE ONE CAPSULE BY MOUTH EVERY DAY 90 capsule 3  . fluticasone (FLONASE) 50 MCG/ACT nasal spray Place 2 sprays into both nostrils daily. 16 g 11  . naproxen sodium (ANAPROX) 220 MG tablet Take 220 mg by mouth 2 (two) times daily as needed. For pain     No current facility-administered medications on file prior to visit.   Review of Systems All otherwise neg per pt     Objective:   Physical Exam BP 118/76 mmHg  Pulse 86  Temp(Src) 98 F (36.7 C)  Ht 5\' 9"  (1.753 m)  Wt 198 lb (89.812 kg)  BMI 29.23  kg/m2  SpO2 96%  LMP 10/14/2014 VS noted, mild ill Constitutional: Pt appears in no significant distress HENT: Head: NCAT.  Right Ear: External ear normal.  Left Ear: External ear normal.  Eyes: . Pupils are equal, round, and reactive to light. Conjunctivae and EOM are normal Neck: Normal range of motion. Neck supple.  Cardiovascular: Normal rate and regular rhythm.   Pulmonary/Chest: Effort normal and breath sounds without rales or wheezing.  Bilat tm's with mild erythema.  Max sinus areas mild tender.  Pharynx with mild erythema, no exudate Neurological: Pt is alert. Not confused , motor grossly intact Skin: Skin is warm. No rash, no LE edema Psychiatric: Pt behavior is normal. No agitation.      Assessment & Plan:

## 2014-11-04 NOTE — Progress Notes (Signed)
Pre visit review using our clinic review tool, if applicable. No additional management support is needed unless otherwise documented below in the visit note. 

## 2014-12-16 ENCOUNTER — Emergency Department
Admission: EM | Admit: 2014-12-16 | Discharge: 2014-12-16 | Disposition: A | Discharge status: Home / Self Care | Payer: BLUE CROSS/BLUE SHIELD | Source: Home / Self Care | Attending: Family Medicine | Admitting: Family Medicine

## 2014-12-16 ENCOUNTER — Encounter: Payer: Self-pay | Admitting: *Deleted

## 2014-12-16 DIAGNOSIS — R51 Headache: Secondary | ICD-10-CM | POA: Diagnosis not present

## 2014-12-16 DIAGNOSIS — R110 Nausea: Secondary | ICD-10-CM | POA: Diagnosis not present

## 2014-12-16 DIAGNOSIS — L299 Pruritus, unspecified: Secondary | ICD-10-CM

## 2014-12-16 DIAGNOSIS — R519 Headache, unspecified: Secondary | ICD-10-CM

## 2014-12-16 MED ORDER — PROMETHAZINE HCL 25 MG PO TABS: 25.0000 mg | ORAL_TABLET | Freq: Four times a day (QID) | ORAL | Status: DC | PRN

## 2014-12-16 MED ORDER — HYDROXYZINE HCL 25 MG PO TABS: 25.0000 mg | ORAL_TABLET | Freq: Four times a day (QID) | ORAL | Status: DC

## 2014-12-16 MED ORDER — KETOROLAC TROMETHAMINE 60 MG/2ML IM SOLN
60.0000 mg | Freq: Once | INTRAMUSCULAR | Status: AC
  Administered 2014-12-16: 60 mg via INTRAMUSCULAR

## 2014-12-16 MED ORDER — METOCLOPRAMIDE HCL 5 MG/ML IJ SOLN
10.0000 mg | Freq: Once | INTRAMUSCULAR | Status: AC
  Administered 2014-12-16: 10 mg via INTRAMUSCULAR

## 2014-12-16 MED ORDER — FLUTICASONE PROPIONATE 50 MCG/ACT NA SUSP: 1.0000 | Freq: Every day | NASAL | Status: DC

## 2014-12-16 MED ORDER — DIPHENHYDRAMINE HCL 12.5 MG/5ML PO ELIX
25.0000 mg | ORAL_SOLUTION | Freq: Once | ORAL | Status: AC
  Administered 2014-12-16: 25 mg via ORAL

## 2014-12-16 NOTE — ED Notes (Signed)
Pt c/o nausea and HA x last night. She also c/o itching all over x 2 wks. She reports a roach infestation at her work, she is unsure if this may be related.

## 2014-12-16 NOTE — ED Notes (Signed)
Fabianna called her sister to arrange transportation prior to giving injections. Charna Archer, LPN

## 2014-12-16 NOTE — ED Provider Notes (Signed)
CSN: [id number]     Arrival date & time 12/16/14  1140 History   First MD Initiated Contact with Patient 12/16/14 1151     Chief Complaint  Patient presents with  . Pruritis  . Nausea   (Consider location/radiation/quality/duration/timing/severity/associated sxs/prior Treatment) HPI The patient is a 49 year old female with history of anxiety and depression presenting to urgent care with complaints of nausea and generalized gradually worsening headache,  8/10 at worse, that started last night.  Patient also states she started having diffuse itching for the last 2 weeks.  Patient is concerned all of her symptoms are related to a roach infestation her work.  She states they have been spraying heavily 3 different times, however,roaches are still present.  Patient is unsure if she is allergic to the insecticide and/or roaches.  She does take Zyrtec daily without relief.  Denies fevers, chills, vomiting or diarrhea.  Denies history of migraines.  Denies change in vision or balance.  Denies numbness or tingling in arms or legs.  Denies neck pain or stiffness.  Denies tick bites or rashes.  Denies new soaps, lotions or medications.  Past Medical History  Diagnosis Date  . BREAST CANCER, HX OF 01/21/2007    at 49yo  . ANXIETY   . DEPRESSION   . HYPERLIPIDEMIA   . ALLERGIC RHINITIS   . GERD   . RENAL CALCULUS   . ANEMIA-IRON DEFICIENCY    Past Surgical History  Procedure Laterality Date  . Mastectomy      right  . Tonsillectomy    . Breast enhancement surgery    . Temporomandibular joint surgery      Left   Family History  Problem Relation Age of Onset  . Colon polyps Mother   . Cancer Mother     Uterine Cancer  . Hypertension Mother   . Dementia Mother   . Colon polyps Father   . COPD Father     smoked  . Hypertension Other   . Diabetes Other   . Asthma Sister    History  Substance Use Topics  . Smoking status: Never Smoker   . Smokeless tobacco: Never Used  . Alcohol Use: No    OB History    No data available     Review of Systems  Constitutional: Negative for fever and chills.  Eyes: Negative for photophobia and visual disturbance.  Respiratory: Negative for cough and shortness of breath.   Cardiovascular: Negative for chest pain and palpitations.  Gastrointestinal: Positive for nausea. Negative for vomiting, abdominal pain, diarrhea and constipation.  Musculoskeletal: Negative for myalgias, back pain and neck pain.  Skin: Negative for color change, rash and wound.       Itching  Neurological: Positive for headaches. Negative for dizziness, syncope, weakness, light-headedness and numbness.    Allergies  Amoxicillin-pot clavulanate and Codeine  Home Medications   Prior to Admission medications   Medication Sig Start Date End Date Taking? Authorizing Provider  cetirizine (ZYRTEC) 10 MG tablet Take 10 mg by mouth daily.   Yes Historical Provider, MD  FLUoxetine (PROZAC) 20 MG capsule TAKE ONE CAPSULE BY MOUTH EVERY DAY 04/11/14  Yes Biagio Borg, MD  fluticasone [facility]) 50 MCG/ACT nasal spray Place 2 sprays into both nostrils daily. 04/12/13   Biagio Borg, MD  fluticasone (FLONASE) 50 MCG/ACT nasal spray Place 1 spray into both nostrils daily. 12/16/14   Noland Fordyce, PA-C  hydrOXYzine (ATARAX/VISTARIL) 25 MG tablet Take 1 tablet (25 mg total) by mouth  every 6 (six) hours. 12/16/14   Noland Fordyce, PA-C  naproxen sodium (ANAPROX) 220 MG tablet Take 220 mg by mouth 2 (two) times daily as needed. For pain    Historical Provider, MD  promethazine (PHENERGAN) 25 MG tablet Take 1 tablet (25 mg total) by mouth every 6 (six) hours as needed for nausea or vomiting. 12/16/14   Noland Fordyce, PA-C  sulfamethoxazole-trimethoprim (BACTRIM DS,SEPTRA DS) 800-160 MG per tablet Take 1 tablet by mouth 2 (two) times daily. 11/04/14   Biagio Borg, MD   BP 127/92 mmHg  Pulse 77  Temp(Src) 98.1 F (36.7 C) (Oral)  Resp 18  Ht 5\' 9"  (1.753 m)  Wt 195 lb (88.451 kg)  BMI  28.78 kg/m2  SpO2 96% Physical Exam  Constitutional: She is oriented to person, place, and time. She appears well-developed and well-nourished. No distress.  HENT:  Head: Normocephalic and atraumatic.  Right Ear: Hearing, tympanic membrane, external ear and ear canal normal.  Left Ear: Hearing, tympanic membrane, external ear and ear canal normal.  Nose: Nose normal.  Mouth/Throat: Uvula is midline, oropharynx is clear and moist and mucous membranes are normal.  Eyes: Conjunctivae and EOM are normal. Pupils are equal, round, and reactive to light. No scleral icterus.  Neck: Normal range of motion. Neck supple.  No nuchal rigidity or meningeal signs.  Cardiovascular: Normal rate, regular rhythm and normal heart sounds.   Pulmonary/Chest: Effort normal and breath sounds normal. No respiratory distress. She has no wheezes. She has no rales. She exhibits no tenderness.  Abdominal: Soft. Bowel sounds are normal. She exhibits no distension and no mass. There is no tenderness. There is no rebound and no guarding.  Musculoskeletal: Normal range of motion.  Neurological: She is alert and oriented to person, place, and time. She has normal strength. No cranial nerve deficit or sensory deficit. Coordination and gait normal. GCS eye subscore is 4. GCS verbal subscore is 5. GCS motor subscore is 6.  Skin: Skin is warm and dry. She is not diaphoretic.  Nursing note and vitals reviewed.   ED Course  Procedures (including critical care time) Labs Review Labs Reviewed - No data to display  Imaging Review No results found.   MDM   1. Acute nonintractable headache, unspecified headache type   2. Nausea without vomiting   3. Pruritus     Pt c/o diffuse itching, nausea and HA that was gradual in onset, 8/10 at worst. Doubt SAH, CVA, meningitis, or other emergent process taking place at this time.  Symptoms likely due to exposure to insecticides and poor ventilation or could be allergic to roach  feces.  No respiratory distress. No evidence of anaphylaxis. No imaging or lab work indicated at this time in urgent care setting. Advised pt she could go to an allergist if pruritis persists. Tx in UC: Toradol 60mg  IM, Reglan 10mg  IM, and Benadryl PO 25mg .  Pt allowed to rest in darkened room in urgent care. HA improved from 8/10 to 2/10 after tx in UC.  Nausea resolved. Pt states she feels comfortable being discharged home. Rx: phenergan, atarax, and flonase. Work note provided for pt to take off tomorrow. Included in work note: allow pt to take breaks for 5 minutes every 1-2 hours while insecticide is being used in office space. Encouraged to have ventilation with fans and/or open windows when insecticide being used as this likely contributed to pt's symptoms.  F/u with PCP as needed. Patient verbalized understanding and agreement with treatment  plan.     Noland Fordyce, PA-C 12/16/14 1335

## 2014-12-16 NOTE — Discharge Instructions (Signed)
Discontinue taking Zyrtec while taking the atarax as these medications are in the same class of medications.   While your office is spraying for bugs, please try to take breaks for 5-10 minutes every 1-2 hours to get fresh air or ask if fans can be used to help ventilate the area you are in. See below for further instructions.

## 2014-12-20 ENCOUNTER — Ambulatory Visit (INDEPENDENT_AMBULATORY_CARE_PROVIDER_SITE_OTHER): Payer: BLUE CROSS/BLUE SHIELD | Admitting: Family Medicine

## 2014-12-20 ENCOUNTER — Encounter: Payer: Self-pay | Admitting: Family Medicine

## 2014-12-20 VITALS — BP 108/76 | Temp 97.6°F | Wt 195.0 lb

## 2014-12-20 DIAGNOSIS — L299 Pruritus, unspecified: Secondary | ICD-10-CM

## 2014-12-20 DIAGNOSIS — R109 Unspecified abdominal pain: Secondary | ICD-10-CM | POA: Diagnosis not present

## 2014-12-20 LAB — POCT URINALYSIS DIPSTICK
Bilirubin, UA: 1
Blood, UA: NEGATIVE
Glucose, UA: NEGATIVE
Ketones, UA: NEGATIVE
Leukocytes, UA: NEGATIVE
Nitrite, UA: NEGATIVE
Protein, UA: NEGATIVE
Spec Grav, UA: >=1.03
Urobilinogen, UA: 0.2
pH, UA: 6

## 2014-12-20 NOTE — Assessment & Plan Note (Signed)
UA is negative. Advised to push fluids.  Has been drinking less because told not to eat at their desks with all the pesticides. Antihistamines likely further drying her out. Advised to push fluids. Call or return to clinic prn if these symptoms worsen or fail to improve as anticipated. The patient indicates understanding of these issues and agrees with the plan.

## 2014-12-20 NOTE — Progress Notes (Signed)
Subjective:   Patient ID: Caitlin Greene, female    DOB: Mar 26, 1966, 49 y.o.   MRN: [id number]  Caitlin Greene is a pleasant 49 y.o. year old female pt of Dr. Jenny Reichmann, new to me, who presents to weekend clinic today with Itchy Skin and Abdominal Discomfort  on 12/20/2014  HPI:  Has been itching since they have been lifting carpet from her office and treating for roaches.  Of note, was seen at Urgent Care 4 days ago with diffuse itching, nausea and headache.  Note reviewed from 8/9. Felt perhaps she was having an allergic reaction to roaches and or pesticides.  Given IM Toradol, IM Reglan, po benadryl and sent home with rxs for phenergan, atarax and flonase.  Those rxs helped with itching.    Now having increased urinary frequency, suprapubic pain, intermittent dysuria.  No back pain or vomiting.  No fevers.  Has been nauseated but thought it was from the allergic reaction.  Lab Results  Component Value Date   NA 138 06/04/2014   K 3.6 06/04/2014   CL 104 06/04/2014   CO2 26 06/04/2014   Lab Results  Component Value Date   CREATININE 0.91 06/04/2014   Lab Results  Component Value Date   WBC 9.1 06/04/2014   HGB 14.1 06/04/2014   HCT 40.6 06/04/2014   MCV 87.9 06/04/2014   PLT 316.0 06/04/2014    Current Outpatient Prescriptions on File Prior to Visit  Medication Sig Dispense Refill  . cetirizine (ZYRTEC) 10 MG tablet Take 10 mg by mouth daily.    Marland Kitchen FLUoxetine (PROZAC) 20 MG capsule TAKE ONE CAPSULE BY MOUTH EVERY DAY 90 capsule 3  . fluticasone (FLONASE) 50 MCG/ACT nasal spray Place 2 sprays into both nostrils daily. 16 g 11  . fluticasone (FLONASE) 50 MCG/ACT nasal spray Place 1 spray into both nostrils daily. 9.9 g 2  . hydrOXYzine (ATARAX/VISTARIL) 25 MG tablet Take 1 tablet (25 mg total) by mouth every 6 (six) hours. 12 tablet 0  . naproxen sodium (ANAPROX) 220 MG tablet Take 220 mg by mouth 2 (two) times daily as needed. For pain    . promethazine (PHENERGAN) 25 MG  tablet Take 1 tablet (25 mg total) by mouth every 6 (six) hours as needed for nausea or vomiting. (Patient not taking: Reported on 12/20/2014) 10 tablet 0   No current facility-administered medications on file prior to visit.    Allergies  Allergen Reactions  . Amoxicillin-Pot Clavulanate Hives  . Codeine Nausea And Vomiting    Needs pre-meds    Past Medical History  Diagnosis Date  . BREAST CANCER, HX OF 01/21/2007    at 49yo  . ANXIETY   . DEPRESSION   . HYPERLIPIDEMIA   . ALLERGIC RHINITIS   . GERD   . RENAL CALCULUS   . ANEMIA-IRON DEFICIENCY     Past Surgical History  Procedure Laterality Date  . Mastectomy      right  . Tonsillectomy    . Breast enhancement surgery    . Temporomandibular joint surgery      Left    Family History  Problem Relation Age of Onset  . Colon polyps Mother   . Cancer Mother     Uterine Cancer  . Hypertension Mother   . Dementia Mother   . Colon polyps Father   . COPD Father     smoked  . Hypertension Other   . Diabetes Other   . Asthma Sister     Social  History   Social History  . Marital Status: Married    Spouse Name: N/A  . Number of Children: 0  . Years of Education: N/A   Occupational History  . BANKRUPTCY ANALYST [employer]   Social History Main Topics  . Smoking status: Never Smoker   . Smokeless tobacco: Never Used  . Alcohol Use: No  . Drug Use: No  . Sexual Activity: Not on file   Other Topics Concern  . Not on file   Social History Narrative   The PMH, PSH, Social History, Family History, Medications, and allergies have been reviewed in [facility], and have been updated if relevant.   Review of Systems  Constitutional: Positive for fever and chills. Negative for fatigue.  HENT: Negative.   Eyes: Negative.   Respiratory: Negative.   Cardiovascular: Negative.   Gastrointestinal: Positive for nausea and abdominal pain. Negative for vomiting.  Endocrine: Negative.   Genitourinary: Positive for  dysuria and decreased urine volume. Negative for vaginal bleeding.  Musculoskeletal: Negative.   Skin: Negative.   Allergic/Immunologic: Negative.   Neurological: Negative.   Hematological: Negative.   Psychiatric/Behavioral: Negative.   All other systems reviewed and are negative.      Objective:    BP 108/76 mmHg  Temp(Src) 97.6 F (36.4 C) (Oral)  Wt 195 lb (88.451 kg)   Physical Exam  Constitutional: She is oriented to person, place, and time. She appears well-developed and well-nourished. No distress.  HENT:  Head: Normocephalic.  Eyes: Conjunctivae are normal.  Cardiovascular: Normal rate.   Pulmonary/Chest: Effort normal.  Abdominal: Soft. Bowel sounds are normal. She exhibits no distension and no mass. There is tenderness. There is no rebound and no guarding.  Musculoskeletal: She exhibits no edema.  Neurological: She is alert and oriented to person, place, and time. No cranial nerve deficit.  Skin: Skin is warm and dry.  Psychiatric: She has a normal mood and affect. Her behavior is normal. Judgment and thought content normal.  Nursing note and vitals reviewed.         Assessment & Plan:   Itching  Abdominal pain, unspecified abdominal location No Follow-up on file.

## 2014-12-20 NOTE — Patient Instructions (Addendum)
Nice to meet you. Please drink plenty of fluids. Your urine is clean. Please follow up with your doctor on Monday. Go to the ER if you develop any new or worsening symptoms over the weekend.

## 2014-12-20 NOTE — Progress Notes (Signed)
Pre visit review using our clinic review tool, if applicable. No additional management support is needed unless otherwise documented below in the visit note. 

## 2014-12-20 NOTE — Assessment & Plan Note (Signed)
Improving with antihistamines. Advised to continue prn rxs and follow up with PCP next week.

## 2015-01-22 ENCOUNTER — Encounter: Payer: Self-pay | Admitting: Family

## 2015-01-22 ENCOUNTER — Ambulatory Visit (INDEPENDENT_AMBULATORY_CARE_PROVIDER_SITE_OTHER): Payer: BLUE CROSS/BLUE SHIELD | Admitting: Family

## 2015-01-22 VITALS — BP 128/90 | HR 81 | Temp 98.5°F | Resp 18 | Ht 68.75 in | Wt 189.0 lb

## 2015-01-22 DIAGNOSIS — Z23 Encounter for immunization: Secondary | ICD-10-CM

## 2015-01-22 DIAGNOSIS — J019 Acute sinusitis, unspecified: Secondary | ICD-10-CM

## 2015-01-22 MED ORDER — DOXYCYCLINE HYCLATE 100 MG PO TABS: 100.0000 mg | ORAL_TABLET | Freq: Two times a day (BID) | ORAL | Status: DC

## 2015-01-22 NOTE — Progress Notes (Signed)
Subjective:    Patient ID: Caitlin Greene, female    DOB: [birth year], 49 y.o.   MRN: [id number]  Chief Complaint  Patient presents with  . Sore Throat    Ear ache, fever, sore throat and congestion, since monday    HPI:  Caitlin Greene is a 49 y.o. female with a PMH of anemia, anxiety, depression, GERD, hyperlipidemia, and kidney stones who presents today for an acute office visit.  This is a new problem. Associated symptoms of earache, fever, sore throat, sinus pressure, ear ache, dental pain and congestion have been going on for approximately 2 weeks which has progressively worsened about 3 days . Modifying factors include Zyrtec and Mucinex which have helped a little but continues to experience symptoms. Has been in and out of the hospital over the past several weeks with sick family members. Denies any recent antibiotics.   Allergies  Allergen Reactions  . Amoxicillin-Pot Clavulanate Hives  . Codeine Nausea And Vomiting    Needs pre-meds    Current Outpatient Prescriptions on File Prior to Visit  Medication Sig Dispense Refill  . cetirizine (ZYRTEC) 10 MG tablet Take 10 mg by mouth daily.    Marland Kitchen FLUoxetine (PROZAC) 20 MG capsule TAKE ONE CAPSULE BY MOUTH EVERY DAY 90 capsule 3  . fluticasone (FLONASE) 50 MCG/ACT nasal spray Place 2 sprays into both nostrils daily. 16 g 11  . fluticasone (FLONASE) 50 MCG/ACT nasal spray Place 1 spray into both nostrils daily. 9.9 g 2  . hydrOXYzine (ATARAX/VISTARIL) 25 MG tablet Take 1 tablet (25 mg total) by mouth every 6 (six) hours. 12 tablet 0  . naproxen sodium (ANAPROX) 220 MG tablet Take 220 mg by mouth 2 (two) times daily as needed. For pain    . promethazine (PHENERGAN) 25 MG tablet Take 1 tablet (25 mg total) by mouth every 6 (six) hours as needed for nausea or vomiting. 10 tablet 0   No current facility-administered medications on file prior to visit.    Review of Systems  Constitutional: Positive for fever.  HENT: Positive for  congestion, ear pain, sinus pressure and sore throat.   Respiratory: Negative for cough, chest tightness and shortness of breath.   Neurological: Positive for headaches.      Objective:    BP 128/90 mmHg  Pulse 81  Temp(Src) 98.5 F (36.9 C) (Oral)  Resp 18  Ht 5' 8.75" (1.746 m)  Wt 189 lb (85.73 kg)  BMI 28.12 kg/m2  SpO2 97% Nursing note and vital signs reviewed.  Physical Exam  Constitutional: She is oriented to person, place, and time. She appears well-developed and well-nourished. No distress.  HENT:  Right Ear: Hearing, tympanic membrane, external ear and ear canal normal.  Left Ear: Hearing, tympanic membrane, external ear and ear canal normal.  Nose: Right sinus exhibits maxillary sinus tenderness and frontal sinus tenderness. Left sinus exhibits maxillary sinus tenderness and frontal sinus tenderness.  Mouth/Throat: Uvula is midline, oropharynx is clear and moist and mucous membranes are normal.  Eyes: Conjunctivae are normal. Pupils are equal, round, and reactive to light.  Cardiovascular: Normal rate, regular rhythm, normal heart sounds and intact distal pulses.   Pulmonary/Chest: Effort normal and breath sounds normal.  Neurological: She is alert and oriented to person, place, and time.  Skin: Skin is warm and dry.  Psychiatric: She has a normal mood and affect. Her behavior is normal. Judgment and thought content normal.       Assessment & Plan:   Problem List  Items Addressed This Visit      Respiratory   Acute sinus infection    Symptoms and exam consistent with bacterial sinusitis. Start doxycycline. Continue over-the-counter medications as needed for symptom relief and supportive care. Follow-up if symptoms worsen or fail to improve.      Relevant Medications   doxycycline (VIBRA-TABS) 100 MG tablet    Other Visit Diagnoses    Encounter for immunization    -  Primary

## 2015-01-22 NOTE — Patient Instructions (Signed)
Thank you for choosing [facility].  Summary/Instructions:  Your prescription(s) have been submitted to your pharmacy or been printed and provided for you. Please take as directed and contact our office if you believe you are having problem(s) with the medication(s) or have any questions.  If your symptoms worsen or fail to improve, please contact our office for further instruction, or in case of emergency go directly to the emergency room at the closest medical facility.   General Recommendations:    Please drink plenty of fluids.  Get plenty of rest   Sleep in humidified air  Use saline nasal sprays  Netti pot   OTC Medications:  Decongestants - helps relieve congestion   Flonase (generic fluticasone) or Nasacort (generic triamcinolone) - please make sure to use the "cross-over" technique at a 45 degree angle towards the opposite eye as opposed to straight up the nasal passageway.   If you have HIGH BLOOD PRESSURE - Coricidin HBP; AVOID any product that is -D as this contains pseudoephedrine which may increase your blood pressure.  Afrin (oxymetazoline) every 6-8 hours for up to 3 days.   Allergies - helps relieve runny nose, itchy eyes and sneezing   Claritin (generic loratidine), Allegra (fexofenidine), or Zyrtec (generic cyrterizine) for runny nose. These medications should not cause drowsiness.  Note - Benadryl (generic diphenhydramine) may be used however may cause drowsiness  Cough -   Delsym or Robitussin (generic dextromethorphan)  Expectorants - helps loosen mucus to ease removal   Mucinex (generic guaifenesin) as directed on the package.  Headaches / General Aches   Tylenol (generic acetaminophen) - DO NOT EXCEED 3 grams (3,000 mg) in a 24 hour time period  Advil/Motrin (generic ibuprofen)   Sore Throat -   Salt water gargle   Chloraseptic (generic benzocaine) spray or lozenges / Sucrets (generic dyclonine)    Sinusitis Sinusitis is  redness, soreness, and inflammation of the paranasal sinuses. Paranasal sinuses are air pockets within the bones of your face (beneath the eyes, the middle of the forehead, or above the eyes). In healthy paranasal sinuses, mucus is able to drain out, and air is able to circulate through them by way of your nose. However, when your paranasal sinuses are inflamed, mucus and air can become trapped. This can allow bacteria and other germs to grow and cause infection. Sinusitis can develop quickly and last only a short time (acute) or continue over a long period (chronic). Sinusitis that lasts for more than 12 weeks is considered chronic.  CAUSES  Causes of sinusitis include:  Allergies.  Structural abnormalities, such as displacement of the cartilage that separates your nostrils (deviated septum), which can decrease the air flow through your nose and sinuses and affect sinus drainage.  Functional abnormalities, such as when the small hairs (cilia) that line your sinuses and help remove mucus do not work properly or are not present. SIGNS AND SYMPTOMS  Symptoms of acute and chronic sinusitis are the same. The primary symptoms are pain and pressure around the affected sinuses. Other symptoms include:  Upper toothache.  Earache.  Headache.  Bad breath.  Decreased sense of smell and taste.  A cough, which worsens when you are lying flat.  Fatigue.  Fever.  Thick drainage from your nose, which often is green and may contain pus (purulent).  Swelling and warmth over the affected sinuses. DIAGNOSIS  Your health care provider will perform a physical exam. During the exam, your health care provider may:  Look in your   nose for signs of abnormal growths in your nostrils (nasal polyps).  Tap over the affected sinus to check for signs of infection.  View the inside of your sinuses (endoscopy) using an imaging device that has a light attached (endoscope). If your health care provider suspects  that you have chronic sinusitis, one or more of the following tests may be recommended:  Allergy tests.  Nasal culture. A sample of mucus is taken from your nose, sent to a lab, and screened for bacteria.  Nasal cytology. A sample of mucus is taken from your nose and examined by your health care provider to determine if your sinusitis is related to an allergy. TREATMENT  Most cases of acute sinusitis are related to a viral infection and will resolve on their own within 10 days. Sometimes medicines are prescribed to help relieve symptoms (pain medicine, decongestants, nasal steroid sprays, or saline sprays).  However, for sinusitis related to a bacterial infection, your health care provider will prescribe antibiotic medicines. These are medicines that will help kill the bacteria causing the infection.  Rarely, sinusitis is caused by a fungal infection. In theses cases, your health care provider will prescribe antifungal medicine. For some cases of chronic sinusitis, surgery is needed. Generally, these are cases in which sinusitis recurs more than 3 times per year, despite other treatments. HOME CARE INSTRUCTIONS   Drink plenty of water. Water helps thin the mucus so your sinuses can drain more easily.  Use a humidifier.  Inhale steam 3 to 4 times a day (for example, sit in the bathroom with the shower running).  Apply a warm, moist washcloth to your face 3 to 4 times a day, or as directed by your health care provider.  Use saline nasal sprays to help moisten and clean your sinuses.  Take medicines only as directed by your health care provider.  If you were prescribed either an antibiotic or antifungal medicine, finish it all even if you start to feel better. SEEK IMMEDIATE MEDICAL CARE IF:  You have increasing pain or severe headaches.  You have nausea, vomiting, or drowsiness.  You have swelling around your face.  You have vision problems.  You have a stiff neck.  You have  difficulty breathing. MAKE SURE YOU:   Understand these instructions.  Will watch your condition.  Will get help right away if you are not doing well or get worse. Document Released: 04/25/2005 Document Revised: 09/09/2013 Document Reviewed: 05/10/2011 Exit[employer] Patient Information 2015 [employer]. This information is not intended to replace advice given to you by your health care provider. Make sure you discuss any questions you have with your health care provider.  

## 2015-01-22 NOTE — Assessment & Plan Note (Signed)
Symptoms and exam consistent with bacterial sinusitis. Start doxycycline. Continue over-the-counter medications as needed for symptom relief and supportive care. Follow up if symptoms worsen or fail to improve. 

## 2015-01-22 NOTE — Progress Notes (Signed)
Pre visit review using our clinic review tool, if applicable. No additional management support is needed unless otherwise documented below in the visit note. 

## 2015-04-01 ENCOUNTER — Ambulatory Visit: Payer: BLUE CROSS/BLUE SHIELD | Admitting: Internal Medicine

## 2015-04-01 ENCOUNTER — Encounter: Payer: Self-pay | Admitting: Internal Medicine

## 2015-04-01 ENCOUNTER — Ambulatory Visit (INDEPENDENT_AMBULATORY_CARE_PROVIDER_SITE_OTHER): Payer: BLUE CROSS/BLUE SHIELD | Admitting: Internal Medicine

## 2015-04-01 VITALS — BP 122/86 | HR 84 | Temp 98.4°F | Ht 68.0 in | Wt 201.0 lb

## 2015-04-01 DIAGNOSIS — J309 Allergic rhinitis, unspecified: Secondary | ICD-10-CM

## 2015-04-01 DIAGNOSIS — F411 Generalized anxiety disorder: Secondary | ICD-10-CM | POA: Diagnosis not present

## 2015-04-01 DIAGNOSIS — J019 Acute sinusitis, unspecified: Secondary | ICD-10-CM | POA: Diagnosis not present

## 2015-04-01 MED ORDER — CLARITHROMYCIN ER 500 MG PO TB24: ORAL_TABLET | ORAL | Status: DC

## 2015-04-01 NOTE — Assessment & Plan Note (Signed)
stable overall by history and exam, recent data reviewed with pt, and pt to continue medical treatment as before,  to f/u any worsening symptoms or concerns Lab Results  Component Value Date   WBC 9.1 06/04/2014   HGB 14.1 06/04/2014   HCT 40.6 06/04/2014   PLT 316.0 06/04/2014   GLUCOSE 88 06/04/2014   CHOL 188 04/12/2013   TRIG 96.0 04/12/2013   HDL 39.70 04/12/2013   LDLDIRECT 136* 04/21/2014   LDLCALC 129* 04/12/2013   ALT 15 04/21/2014   AST 14 04/21/2014   NA 138 06/04/2014   K 3.6 06/04/2014   CL 104 06/04/2014   CREATININE 0.91 06/04/2014   BUN 19 06/04/2014   CO2 26 06/04/2014   TSH 3.223 04/21/2014   HGBA1C 5.5 04/21/2014

## 2015-04-01 NOTE — Patient Instructions (Signed)
Please take all new medication as prescribed - the antibiotic  Please continue all other medications as before, and refills have been done if requested.  Please have the pharmacy call with any other refills you may need.  Please keep your appointments with your specialists as you may have planned   

## 2015-04-01 NOTE — Assessment & Plan Note (Signed)
Mild to mod, for antibx course,  to f/u any worsening symptoms or concerns 

## 2015-04-01 NOTE — Progress Notes (Signed)
Pre visit review using our clinic review tool, if applicable. No additional management support is needed unless otherwise documented below in the visit note. 

## 2015-04-01 NOTE — Assessment & Plan Note (Signed)
Mild to mod, for flonase restart,  to f/u any worsening symptoms or concerns 

## 2015-04-01 NOTE — Progress Notes (Signed)
Subjective:    Patient ID: Caitlin Greene, female    DOB: 1966-01-06, 49 y.o.   MRN: [id number]  HPI   Here with 2-3 days acute onset fever, facial pain, pressure, headache, general weakness and malaise, and greenish d/c, with mild ST and cough, but pt denies chest pain, wheezing, increased sob or doe, orthopnea, PND, increased LE swelling, palpitations, dizziness or syncope. Pt denies new neurological symptoms such as new headache, or facial or extremity weakness or numbness   Pt denies polydipsia, polyuria,  Does have several wks ongoing nasal allergy symptoms with clearish congestion, itch and sneezing, without fever, pain, ST, cough, swelling or wheezing. Denies worsening depressive symptoms, suicidal ideation, or panic Past Medical History  Diagnosis Date  . BREAST CANCER, HX OF 01/21/2007    at 49yo  . ANXIETY   . DEPRESSION   . HYPERLIPIDEMIA   . ALLERGIC RHINITIS   . GERD   . RENAL CALCULUS   . ANEMIA-IRON DEFICIENCY    Past Surgical History  Procedure Laterality Date  . Mastectomy      right  . Tonsillectomy    . Breast enhancement surgery    . Temporomandibular joint surgery      Left    reports that she has never smoked. She has never used smokeless tobacco. She reports that she does not drink alcohol or use illicit drugs. family history includes Asthma in her sister; COPD in her father; Cancer in her mother; Colon polyps in her father and mother; Dementia in her mother; Diabetes in her other; Hypertension in her mother and other. Allergies  Allergen Reactions  . Amoxicillin-Pot Clavulanate Hives  . Codeine Nausea And Vomiting    Needs pre-meds   Current Outpatient Prescriptions on File Prior to Visit  Medication Sig Dispense Refill  . cetirizine (ZYRTEC) 10 MG tablet Take 10 mg by mouth daily.    . fluticasone (FLONASE) 50 MCG/ACT nasal spray Place 2 sprays into both nostrils daily. 16 g 11  . doxycycline (VIBRA-TABS) 100 MG tablet Take 1 tablet (100 mg total) by  mouth 2 (two) times daily. (Patient not taking: Reported on 04/01/2015) 20 tablet 0  . hydrOXYzine (ATARAX/VISTARIL) 25 MG tablet Take 1 tablet (25 mg total) by mouth every 6 (six) hours. (Patient not taking: Reported on 04/01/2015) 12 tablet 0  . naproxen sodium (ANAPROX) 220 MG tablet Take 220 mg by mouth 2 (two) times daily as needed. For pain    . promethazine (PHENERGAN) 25 MG tablet Take 1 tablet (25 mg total) by mouth every 6 (six) hours as needed for nausea or vomiting. (Patient not taking: Reported on 04/01/2015) 10 tablet 0   No current facility-administered medications on file prior to visit.   Review of Systems  Constitutional: Negative for unusual diaphoresis or night sweats HENT: Negative for ringing in ear or discharge Eyes: Negative for double vision or worsening visual disturbance.  Respiratory: Negative for choking and stridor.   Gastrointestinal: Negative for vomiting or other signifcant bowel change Genitourinary: Negative for hematuria or change in urine volume.  Musculoskeletal: Negative for other MSK pain or swelling Skin: Negative for color change and worsening wound.  Neurological: Negative for tremors and numbness other than noted  Psychiatric/Behavioral: Negative for decreased concentration or agitation other than above       Objective:   Physical Exam BP 122/86 mmHg  Pulse 84  Temp(Src) 98.4 F (36.9 C) (Oral)  Ht 5\' 8"  (1.727 m)  Wt 201 lb (91.173 kg)  BMI  30.57 kg/m2  SpO2 97% VS noted,  Constitutional: Pt appears in no significant distress HENT: Head: NCAT.  Right Ear: External ear normal.  Left Ear: External ear normal.  Bilat tm's with mild erythema.  Max sinus areas mild tender.  Pharynx with mild erythema, no exudate Eyes: . Pupils are equal, round, and reactive to light. Conjunctivae and EOM are normal Neck: Normal range of motion. Neck supple.  Cardiovascular: Normal rate and regular rhythm.   Pulmonary/Chest: Effort normal and breath sounds  without rales or wheezing.  Neurological: Pt is alert. Not confused , motor grossly intact Skin: Skin is warm. No rash, no LE edema Psychiatric: Pt behavior is normal. No agitation.     Assessment & Plan:

## 2015-05-13 ENCOUNTER — Ambulatory Visit (INDEPENDENT_AMBULATORY_CARE_PROVIDER_SITE_OTHER): Payer: BLUE CROSS/BLUE SHIELD | Admitting: Internal Medicine

## 2015-05-13 ENCOUNTER — Encounter: Payer: Self-pay | Admitting: Internal Medicine

## 2015-05-13 VITALS — BP 118/80 | HR 80 | Temp 97.9°F | Ht 68.0 in | Wt 203.0 lb

## 2015-05-13 DIAGNOSIS — J309 Allergic rhinitis, unspecified: Secondary | ICD-10-CM | POA: Diagnosis not present

## 2015-05-13 DIAGNOSIS — J019 Acute sinusitis, unspecified: Secondary | ICD-10-CM | POA: Diagnosis not present

## 2015-05-13 DIAGNOSIS — F329 Major depressive disorder, single episode, unspecified: Secondary | ICD-10-CM

## 2015-05-13 DIAGNOSIS — F32A Depression, unspecified: Secondary | ICD-10-CM

## 2015-05-13 MED ORDER — AZITHROMYCIN 250 MG PO TABS: ORAL_TABLET | ORAL | Status: DC

## 2015-05-13 NOTE — Progress Notes (Signed)
Subjective:    Patient ID: Caitlin Greene, female    DOB: Sep 22, 1965, 50 y.o.   MRN: [id number]  HPI   Here with 2-3 days acute onset fever, facial pain, pressure, headache, general weakness and malaise, and greenish d/c, with mild ST and cough, but pt denies chest pain, wheezing, increased sob or doe, orthopnea, PND, increased LE swelling, palpitations, dizziness or syncope.  Pt denies new neurological symptoms such as new headache, or facial or extremity weakness or numbness   Pt denies polydipsia, polyuria. Denies worsening depressive symptoms, suicidal ideation, or panic  Does have several wks ongoing nasal allergy symptoms with clearish congestion, itch and sneezing, without fever, pain, ST, cough, swelling or wheezing. Past Medical History  Diagnosis Date  . BREAST CANCER, HX OF 01/21/2007    at 50yo  . ANXIETY   . DEPRESSION   . HYPERLIPIDEMIA   . ALLERGIC RHINITIS   . GERD   . RENAL CALCULUS   . ANEMIA-IRON DEFICIENCY    Past Surgical History  Procedure Laterality Date  . Mastectomy      right  . Tonsillectomy    . Breast enhancement surgery    . Temporomandibular joint surgery      Left    reports that she has never smoked. She has never used smokeless tobacco. She reports that she does not drink alcohol or use illicit drugs. family history includes Asthma in her sister; COPD in her father; Cancer in her mother; Colon polyps in her father and mother; Dementia in her mother; Diabetes in her other; Hypertension in her mother and other. Allergies  Allergen Reactions  . Amoxicillin-Pot Clavulanate Hives  . Codeine Nausea And Vomiting    Needs pre-meds   Current Outpatient Prescriptions on File Prior to Visit  Medication Sig Dispense Refill  . cetirizine (ZYRTEC) 10 MG tablet Take 10 mg by mouth daily.    Marland Kitchen FLUoxetine (PROZAC) 20 MG capsule Take 20 mg by mouth daily.    . fluticasone (FLONASE) 50 MCG/ACT nasal spray Place 2 sprays into both nostrils daily. 16 g 11  .  naproxen sodium (ANAPROX) 220 MG tablet Take 220 mg by mouth 2 (two) times daily as needed. For pain    . clarithromycin (BIAXIN XL) 500 MG 24 hr tablet 2 tabs by mouth daily (Patient not taking: Reported on 05/13/2015) 20 tablet 0  . doxycycline (VIBRA-TABS) 100 MG tablet Take 1 tablet (100 mg total) by mouth 2 (two) times daily. (Patient not taking: Reported on 04/01/2015) 20 tablet 0  . hydrOXYzine (ATARAX/VISTARIL) 25 MG tablet Take 1 tablet (25 mg total) by mouth every 6 (six) hours. (Patient not taking: Reported on 04/01/2015) 12 tablet 0  . promethazine (PHENERGAN) 25 MG tablet Take 1 tablet (25 mg total) by mouth every 6 (six) hours as needed for nausea or vomiting. (Patient not taking: Reported on 04/01/2015) 10 tablet 0   No current facility-administered medications on file prior to visit.    Review of Systems  Constitutional: Negative for unusual diaphoresis or night sweats HENT: Negative for ringing in ear or discharge Eyes: Negative for double vision or worsening visual disturbance.  Respiratory: Negative for choking and stridor.   Gastrointestinal: Negative for vomiting or other signifcant bowel change Genitourinary: Negative for hematuria or change in urine volume.  Musculoskeletal: Negative for other MSK pain or swelling Skin: Negative for color change and worsening wound.  Neurological: Negative for tremors and numbness other than noted  Psychiatric/Behavioral: Negative for decreased concentration or agitation  other than above       Objective:   Physical Exam BP 118/80 mmHg  Pulse 80  Temp(Src) 97.9 F (36.6 C) (Oral)  Ht 5\' 8"  (1.727 m)  Wt 203 lb (92.08 kg)  BMI 30.87 kg/m2  SpO2 96% VS noted, mild ill Constitutional: Pt appears in no significant distress HENT: Head: NCAT.  Right Ear: External ear normal.  Left Ear: External ear normal.  Eyes: . Pupils are equal, round, and reactive to light. Conjunctivae and EOM are normal Bilat tm's with mild erythema.  Max  sinus areas mild tender.  Pharynx with mild erythema, no exudate Neck: Normal range of motion. Neck supple.  Cardiovascular: Normal rate and regular rhythm.   Pulmonary/Chest: Effort normal and breath sounds without rales or wheezing.  Neurological: Pt is alert. Not confused , motor grossly intact Skin: Skin is warm. No rash, no LE edema Psychiatric: Pt behavior is normal. No agitation. not depressed affect    Assessment & Plan:

## 2015-05-13 NOTE — Patient Instructions (Signed)
Please take all new medication as prescribed - the antibiotic  You can also take Delsym OTC for cough, and/or Mucinex (or it's generic off brand) for congestion, and tylenol as needed for pain.  Please continue all other medications as before, and refills have been done if requested.  Please have the pharmacy call with any other refills you may need.  Please keep your appointments with your specialists as you may have planned    

## 2015-05-13 NOTE — Progress Notes (Signed)
Pre visit review using our clinic review tool, if applicable. No additional management support is needed unless otherwise documented below in the visit note. 

## 2015-05-14 NOTE — Assessment & Plan Note (Signed)
Mild to mod, for antibx course,  to f/u any worsening symptoms or concerns 

## 2015-05-14 NOTE — Assessment & Plan Note (Signed)
For flonase re-start, Mild to mod, to f/u any worsening symptoms or concerns

## 2015-05-14 NOTE — Assessment & Plan Note (Signed)
stable overall by history and exam, recent data reviewed with pt, and pt to continue medical treatment as before,  to f/u any worsening symptoms or concerns Lab Results  Component Value Date   WBC 9.1 06/04/2014   HGB 14.1 06/04/2014   HCT 40.6 06/04/2014   PLT 316.0 06/04/2014   GLUCOSE 88 06/04/2014   CHOL 188 04/12/2013   TRIG 96.0 04/12/2013   HDL 39.70 04/12/2013   LDLDIRECT 136* 04/21/2014   LDLCALC 129* 04/12/2013   ALT 15 04/21/2014   AST 14 04/21/2014   NA 138 06/04/2014   K 3.6 06/04/2014   CL 104 06/04/2014   CREATININE 0.91 06/04/2014   BUN 19 06/04/2014   CO2 26 06/04/2014   TSH 3.223 04/21/2014   HGBA1C 5.5 04/21/2014

## 2015-06-26 ENCOUNTER — Ambulatory Visit (INDEPENDENT_AMBULATORY_CARE_PROVIDER_SITE_OTHER): Payer: BLUE CROSS/BLUE SHIELD | Admitting: Nurse Practitioner

## 2015-06-26 ENCOUNTER — Encounter: Payer: Self-pay | Admitting: Nurse Practitioner

## 2015-06-26 VITALS — BP 114/84 | HR 88 | Temp 98.6°F | Resp 16 | Wt 205.0 lb

## 2015-06-26 DIAGNOSIS — H6980 Other specified disorders of Eustachian tube, unspecified ear: Secondary | ICD-10-CM

## 2015-06-26 MED ORDER — METHYLPREDNISOLONE 4 MG PO TABS: ORAL_TABLET | ORAL | Status: DC

## 2015-06-26 NOTE — Progress Notes (Signed)
Patient ID: Caitlin Greene, female    DOB: [birth year]  Age: 50 y.o. MRN: [id number]  CC: Ear Pain   HPI Caitlin Greene presents for CC of right ear pain x 2-3 days.  1) Ear pain 2-3 days  Just got over a sinus infection  Finished z-pack a few weeks ago  Green nasal drainage  Dizziness and HA last 3 days  HA is frontal   Treatment to date:  Decongestant  Zyrtec  Aleve  Flonase- helpful   Sick contacts: Denies  History Caitlin Greene has a past medical history of BREAST CANCER, HX OF (01/21/2007); ANXIETY; DEPRESSION; HYPERLIPIDEMIA; ALLERGIC RHINITIS; GERD; RENAL CALCULUS; and ANEMIA-IRON DEFICIENCY.   She has past surgical history that includes Mastectomy; Tonsillectomy; Breast enhancement surgery; and Temporomandibular joint surgery.   Her family history includes Asthma in her sister; COPD in her father; Cancer in her mother; Colon polyps in her father and mother; Dementia in her mother; Diabetes in her other; Hypertension in her mother and other.She reports that she has never smoked. She has never used smokeless tobacco. She reports that she does not drink alcohol or use illicit drugs.  Outpatient Prescriptions Prior to Visit  Medication Sig Dispense Refill  . cetirizine (ZYRTEC) 10 MG tablet Take 10 mg by mouth daily.    Marland Kitchen FLUoxetine (PROZAC) 20 MG capsule Take 20 mg by mouth daily.    . fluticasone (FLONASE) 50 MCG/ACT nasal spray Place 2 sprays into both nostrils daily. 16 g 11  . hydrOXYzine (ATARAX/VISTARIL) 25 MG tablet Take 1 tablet (25 mg total) by mouth every 6 (six) hours. 12 tablet 0  . naproxen sodium (ANAPROX) 220 MG tablet Take 220 mg by mouth 2 (two) times daily as needed. For pain    . promethazine (PHENERGAN) 25 MG tablet Take 1 tablet (25 mg total) by mouth every 6 (six) hours as needed for nausea or vomiting. 10 tablet 0  . azithromycin (ZITHROMAX Z-PAK) 250 MG tablet Use as directed 6 tablet 1  . clarithromycin (BIAXIN XL) 500 MG 24 hr tablet 2 tabs by mouth daily  (Patient not taking: Reported on 05/13/2015) 20 tablet 0  . doxycycline (VIBRA-TABS) 100 MG tablet Take 1 tablet (100 mg total) by mouth 2 (two) times daily. (Patient not taking: Reported on 04/01/2015) 20 tablet 0   No facility-administered medications prior to visit.    ROS Review of Systems  Constitutional: Negative for fever, chills, diaphoresis and fatigue.  HENT: Positive for congestion.   Respiratory: Negative for cough, chest tightness, shortness of breath and wheezing.   Cardiovascular: Negative for chest pain, palpitations and leg swelling.  Gastrointestinal: Negative for nausea, vomiting and diarrhea.  Skin: Negative for rash.  Neurological: Positive for dizziness and headaches.    Objective:  BP 114/84 mmHg  Pulse 88  Temp(Src) 98.6 F (37 C) (Oral)  Resp 16  Wt 205 lb (92.987 kg)  SpO2 98%  Physical Exam  Constitutional: She is oriented to person, place, and time. She appears well-developed and well-nourished. No distress.  HENT:  Head: Normocephalic and atraumatic.  Right Ear: External ear normal.  Left Ear: External ear normal.  Mouth/Throat: No oropharyngeal exudate.  TM's clear bilaterally Slight bulging on right (known ETD) Nasal turbinates boggy  Eyes: EOM are normal. Pupils are equal, round, and reactive to light. Right eye exhibits no discharge. Left eye exhibits no discharge. No scleral icterus.  Neck: Normal range of motion. Neck supple.  Cardiovascular: Normal rate, regular rhythm and normal heart sounds.  Exam  reveals no gallop and no friction rub.   No murmur heard. Pulmonary/Chest: Effort normal and breath sounds normal. No respiratory distress. She has no wheezes. She has no rales. She exhibits no tenderness.  Lymphadenopathy:    She has no cervical adenopathy.  Neurological: She is alert and oriented to person, place, and time. No cranial nerve deficit. She exhibits normal muscle tone. Coordination normal.  Skin: Skin is warm and dry. No rash  noted. She is not diaphoretic.  Psychiatric: She has a normal mood and affect. Her behavior is normal. Judgment and thought content normal.   Assessment & Plan:   Caitlin Greene was seen today for ear pain.  Diagnoses and all orders for this visit:  ETD (eustachian tube dysfunction), unspecified laterality  Other orders -     methylPREDNISolone (MEDROL) 4 MG tablet; Take 6 tablets by mouth with breakfast or lunch and decrease by 1 tablet each day until gone.   I have discontinued Caitlin Greene's doxycycline, clarithromycin, and azithromycin. I am also having her start on methylPREDNISolone. Additionally, I am having her maintain her naproxen sodium, fluticasone, cetirizine, hydrOXYzine, promethazine, and FLUoxetine.  Meds ordered this encounter  Medications  . methylPREDNISolone (MEDROL) 4 MG tablet    Sig: Take 6 tablets by mouth with breakfast or lunch and decrease by 1 tablet each day until gone.    Dispense:  21 tablet    Refill:  0    Order Specific Question:  Supervising Provider    Answer:  Crecencio Mc [[id number]]     Follow-up: Return if symptoms worsen or fail to improve.

## 2015-06-26 NOTE — Assessment & Plan Note (Signed)
New onset Will treat conservatively due to probable viral nature Prednisone taper to decrease inflammation given  Instructions for taking done verbally and on AVS Lay off of Flonase for a few days due to nasal irritation FU prn worsening/failure to improve.

## 2015-06-26 NOTE — Progress Notes (Signed)
Pre visit review using our clinic review tool, if applicable. No additional management support is needed unless otherwise documented below in the visit note. 

## 2015-06-26 NOTE — Patient Instructions (Signed)
Prednisone with breakfast or lunch at the latest.  6 tablets on day 1, 5 tablets on day 2, 4 tablets on day 3, 3 tablets on day 4, 2 tablets day 5, 1 tablet on day 6...done! Take tablets all together not spaced out Don't take with NSAIDs (Ibuprofen, Aleve, Naproxen, Meloxicam ect...)

## 2015-07-13 ENCOUNTER — Emergency Department (INDEPENDENT_AMBULATORY_CARE_PROVIDER_SITE_OTHER): Payer: BLUE CROSS/BLUE SHIELD

## 2015-07-13 ENCOUNTER — Emergency Department
Admission: EM | Admit: 2015-07-13 | Discharge: 2015-07-13 | Disposition: A | Discharge status: Home / Self Care | Payer: BLUE CROSS/BLUE SHIELD | Source: Home / Self Care | Attending: Family Medicine | Admitting: Family Medicine

## 2015-07-13 ENCOUNTER — Encounter: Payer: Self-pay | Admitting: Emergency Medicine

## 2015-07-13 DIAGNOSIS — R05 Cough: Secondary | ICD-10-CM | POA: Diagnosis not present

## 2015-07-13 DIAGNOSIS — J069 Acute upper respiratory infection, unspecified: Secondary | ICD-10-CM | POA: Diagnosis not present

## 2015-07-13 MED ORDER — ALBUTEROL SULFATE HFA 108 (90 BASE) MCG/ACT IN AERS: 1.0000 | INHALATION_SPRAY | Freq: Four times a day (QID) | RESPIRATORY_TRACT | Status: DC | PRN

## 2015-07-13 MED ORDER — BENZONATATE 100 MG PO CAPS: 100.0000 mg | ORAL_CAPSULE | Freq: Three times a day (TID) | ORAL | Status: DC

## 2015-07-13 NOTE — Discharge Instructions (Signed)

## 2015-07-13 NOTE — ED Notes (Signed)
Pt c/o cough with mucous, fever and feels like her chest is heavy and full x3 days.

## 2015-07-13 NOTE — ED Provider Notes (Signed)
CSN: [id number]     Arrival date & time 07/13/15  1002 History   First MD Initiated Contact with Patient 07/13/15 1036     Chief Complaint  Patient presents with  . Cough   (Consider location/radiation/quality/duration/timing/severity/associated sxs/prior Treatment) HPI  The pt is a 50yo female presenting to [facility] with c/o mild to moderately productive cough with chest tightness and "full" sensation for 3 days.  She reports being on 2-3 antibiotics over the last 1 month for sinus infections and completed a course of methylprednisolone 1 week ago. Symptoms had resolved until cough and congestion 3 days ago.  She has also had subjective cough, headache, sinus congestion, and mild SOB. Denies hx of asthma.  Denies recent travel or sick contacts.   Past Medical History  Diagnosis Date  . BREAST CANCER, HX OF 01/21/2007    at 50yo  . ANXIETY   . DEPRESSION   . HYPERLIPIDEMIA   . ALLERGIC RHINITIS   . GERD   . RENAL CALCULUS   . ANEMIA-IRON DEFICIENCY    Past Surgical History  Procedure Laterality Date  . Mastectomy      right  . Tonsillectomy    . Breast enhancement surgery    . Temporomandibular joint surgery      Left   Family History  Problem Relation Age of Onset  . Colon polyps Mother   . Cancer Mother     Uterine Cancer  . Hypertension Mother   . Dementia Mother   . Colon polyps Father   . COPD Father     smoked  . Hypertension Other   . Diabetes Other   . Asthma Sister    Social History  Substance Use Topics  . Smoking status: Never Smoker   . Smokeless tobacco: Never Used  . Alcohol Use: No   OB History    No data available     Review of Systems  Constitutional: Positive for fever and chills.  HENT: Positive for congestion, postnasal drip, rhinorrhea, sneezing and sore throat. Negative for ear pain, sinus pressure, trouble swallowing and voice change.   Respiratory: Positive for cough, shortness of breath and wheezing. Negative for stridor.   Cardiovascular:  Positive for chest pain. Negative for palpitations.  Gastrointestinal: Negative for nausea, vomiting, abdominal pain and diarrhea.  Musculoskeletal: Negative for myalgias, back pain and arthralgias.  Skin: Negative for rash.  Neurological: Positive for headaches. Negative for dizziness and light-headedness.    Allergies  Amoxicillin-pot clavulanate and Codeine  Home Medications   Prior to Admission medications   Medication Sig Start Date End Date Taking? Authorizing Provider  albuterol (PROVENTIL HFA;VENTOLIN HFA) 108 (90 Base) MCG/ACT inhaler Inhale 1-2 puffs into the lungs every 6 (six) hours as needed for wheezing or shortness of breath. 07/13/15   Noland Fordyce, PA-C  benzonatate (TESSALON) 100 MG capsule Take 1-2 capsules (100-200 mg total) by mouth every 8 (eight) hours. 07/13/15   Noland Fordyce, PA-C  cetirizine (ZYRTEC) 10 MG tablet Take 10 mg by mouth daily.    Historical Provider, MD  FLUoxetine (PROZAC) 20 MG capsule Take 20 mg by mouth daily.    Historical Provider, MD  fluticasone (FLONASE) 50 MCG/ACT nasal spray Place 2 sprays into both nostrils daily. 04/12/13   Biagio Borg, MD  hydrOXYzine (ATARAX/VISTARIL) 25 MG tablet Take 1 tablet (25 mg total) by mouth every 6 (six) hours. 12/16/14   Noland Fordyce, PA-C  methylPREDNISolone (MEDROL) 4 MG tablet Take 6 tablets by mouth with breakfast or  lunch and decrease by 1 tablet each day until gone. 06/26/15   Rubbie Battiest, NP  naproxen sodium (ANAPROX) 220 MG tablet Take 220 mg by mouth 2 (two) times daily as needed. For pain    Historical Provider, MD  promethazine (PHENERGAN) 25 MG tablet Take 1 tablet (25 mg total) by mouth every 6 (six) hours as needed for nausea or vomiting. 12/16/14   Noland Fordyce, PA-C   Meds Ordered and Administered this Visit  Medications - No data to display  BP 124/92 mmHg  Pulse 92  Temp(Src) 98.1 F (36.7 C) (Oral)  Wt 202 lb (91.627 kg)  SpO2 97%  LMP 06/29/2015 No data found.   Physical Exam   Constitutional: She appears well-developed and well-nourished. No distress.  HENT:  Head: Normocephalic and atraumatic.  Right Ear: Tympanic membrane normal.  Left Ear: Tympanic membrane normal.  Nose: Nose normal.  Mouth/Throat: Uvula is midline, oropharynx is clear and moist and mucous membranes are normal.  Eyes: Conjunctivae are normal. No scleral icterus.  Neck: Normal range of motion. Neck supple.  Cardiovascular: Normal rate, regular rhythm and normal heart sounds.   Pulmonary/Chest: Effort normal and breath sounds normal. No stridor. No respiratory distress. She has no wheezes. She has no rales. She exhibits no tenderness.  Abdominal: Soft. She exhibits no distension. There is no tenderness.  Musculoskeletal: Normal range of motion.  Lymphadenopathy:    She has no cervical adenopathy.  Neurological: She is alert.  Skin: Skin is warm and dry. She is not diaphoretic.  Nursing note and vitals reviewed.   ED Course  Procedures (including critical care time)  Labs Review Labs Reviewed - No data to display  Imaging Review Dg Chest 2 View  07/13/2015  CLINICAL DATA:  Cough, fever, sore throat EXAM: CHEST  2 VIEW COMPARISON:  07/08/2014 FINDINGS: Cardiomediastinal silhouette is stable. Bilateral breast implants again noted. The patient is status post right mastectomy and right axillary lymph node dissection. No acute infiltrate or pleural effusion. No pulmonary edema. Bony thorax is unremarkable. IMPRESSION: No active cardiopulmonary disease. Electronically Signed   By: Lahoma Crocker M.D.   On: 07/13/2015 10:43      MDM   1. Acute upper respiratory infection    Pt c/o cough and congestion for 3 days. She completed a Medrol dose pack about 1 week ago following 2 rounds of antibiotics (Azithromycin and Clarithromycin) per medical records for a sinus infection over the last 2 months.    CXR: no active cardiopulmonary disease O2 Sat 97% no evidence of respiratory  distress.  Reassured pt symptoms are likely viral in nature. Will hold off on antibiotics given recent antibiotic treatments, pt is afebrile and CXR normal.  Rx: Albuterol inhaler and tessalon.  Encouraged to f/u with PCP in 1 week if not improving, sooner if worsening. Patient verbalized understanding and agreement with treatment plan.     Noland Fordyce, PA-C 07/13/15 1533

## 2015-09-15 ENCOUNTER — Ambulatory Visit: Payer: BLUE CROSS/BLUE SHIELD | Admitting: Internal Medicine

## 2015-09-16 ENCOUNTER — Ambulatory Visit (INDEPENDENT_AMBULATORY_CARE_PROVIDER_SITE_OTHER): Payer: BLUE CROSS/BLUE SHIELD | Admitting: Family

## 2015-09-16 VITALS — BP 106/82 | HR 101 | Temp 97.9°F | Resp 16 | Wt 203.0 lb

## 2015-09-16 DIAGNOSIS — R0981 Nasal congestion: Secondary | ICD-10-CM

## 2015-09-16 MED ORDER — AZITHROMYCIN 250 MG PO TABS: ORAL_TABLET | ORAL | Status: DC

## 2015-09-16 NOTE — Progress Notes (Signed)
Subjective:    Patient ID: Caitlin Greene, female    DOB: 19-Aug-1965, 50 y.o.   MRN: [id number]   Caitlin Greene is a 50 y.o. female who presents today for an acute visit.    HPI Comments: Here for evaluation of sinus congestion for one month. Endorses nasal congestion. Takes Zyrtec everyday with some relief. Taking mucinex, flonase with some relief. History of seasonal allergies.  Has been seen by ENT in the past.   Particularly worried as sister in law had sinus congestion which evolved into meningitis.     Past Medical History  Diagnosis Date  . BREAST CANCER, HX OF 01/21/2007    at 50yo  . ANXIETY   . DEPRESSION   . HYPERLIPIDEMIA   . ALLERGIC RHINITIS   . GERD   . RENAL CALCULUS   . ANEMIA-IRON DEFICIENCY    Allergies: Amoxicillin-pot clavulanate and Codeine Current Outpatient Prescriptions on File Prior to Visit  Medication Sig Dispense Refill  . cetirizine (ZYRTEC) 10 MG tablet Take 10 mg by mouth daily.    Marland Kitchen FLUoxetine (PROZAC) 20 MG capsule Take 20 mg by mouth daily.    . fluticasone (FLONASE) 50 MCG/ACT nasal spray Place 2 sprays into both nostrils daily. 16 g 11  . naproxen sodium (ANAPROX) 220 MG tablet Take 220 mg by mouth 2 (two) times daily as needed. For pain     No current facility-administered medications on file prior to visit.    Social History  Substance Use Topics  . Smoking status: Never Smoker   . Smokeless tobacco: Never Used  . Alcohol Use: No    Review of Systems  Constitutional: Negative for fever and chills.  HENT: Positive for congestion. Negative for ear pain, sinus pressure and sore throat.   Respiratory: Negative for cough, shortness of breath and wheezing.   Cardiovascular: Negative for chest pain and palpitations.  Gastrointestinal: Negative for nausea and vomiting.  Musculoskeletal: Positive for myalgias.  Neurological: Negative for headaches.      Objective:    BP 106/82 mmHg  Pulse 101  Temp(Src) 97.9 F (36.6 C) (Oral)   Resp 16  Wt 203 lb (92.08 kg)  SpO2 97%   Physical Exam  Constitutional: She appears well-developed and well-nourished.  HENT:  Head: Normocephalic and atraumatic.  Right Ear: Hearing, tympanic membrane, external ear and ear canal normal. No drainage, swelling or tenderness. No foreign bodies. Tympanic membrane is not erythematous and not bulging. No middle ear effusion. No decreased hearing is noted.  Left Ear: Hearing, tympanic membrane, external ear and ear canal normal. No drainage, swelling or tenderness. No foreign bodies. Tympanic membrane is not erythematous and not bulging.  No middle ear effusion. No decreased hearing is noted.  Nose: Rhinorrhea present. Right sinus exhibits no maxillary sinus tenderness and no frontal sinus tenderness. Left sinus exhibits no maxillary sinus tenderness and no frontal sinus tenderness.  Mouth/Throat: Uvula is midline, oropharynx is clear and moist and mucous membranes are normal. No oropharyngeal exudate, posterior oropharyngeal edema, posterior oropharyngeal erythema or tonsillar abscesses.  Eyes: Conjunctivae are normal.  Cardiovascular: Regular rhythm, normal heart sounds and normal pulses.   Pulmonary/Chest: Effort normal and breath sounds normal. She has no wheezes. She has no rhonchi. She has no rales.  Lymphadenopathy:       Head (right side): No submental, no submandibular, no tonsillar, no preauricular, no posterior auricular and no occipital adenopathy present.       Head (left side): No submental, no submandibular, no  tonsillar, no preauricular, no posterior auricular and no occipital adenopathy present.    She has no cervical adenopathy.  Neurological: She is alert.  Skin: Skin is warm and dry.  Psychiatric: She has a normal mood and affect. Her speech is normal and behavior is normal. Thought content normal.  Vitals reviewed.      Assessment & Plan:    1. Sinus congestion Working diagnosis of etiology of sinus congestion viral  versus allergic no significant exam findings to support a bacterial sinus infection at this time. Per chart review, CT maxillofacial  06/2014 was normal which is reassuring.    In the context of the very sad complication of  sinus infection for patient's sister-in-law, I understand patient's concern about sinus infections and agreed to prescribe an antibiotic.   The patient and I had a long discussion about her recurrent sinus infections over the past 6 months, and we jointly agreed to referral to ear nose and throat.  - Ambulatory referral to ENT - azithromycin (ZITHROMAX) 250 MG tablet; Tale 500 mg PO on day 1, then 250 mg PO q24h x 4 days.  Dispense: 6 tablet; Refill: 0   I have discontinued Ms. Gladue's hydrOXYzine, promethazine, methylPREDNISolone, benzonatate, and albuterol. I am also having her start on azithromycin. Additionally, I am having her maintain her naproxen sodium, fluticasone, cetirizine, and FLUoxetine.   Meds ordered this encounter  Medications  . azithromycin (ZITHROMAX) 250 MG tablet    Sig: Tale 500 mg PO on day 1, then 250 mg PO q24h x 4 days.    Dispense:  6 tablet    Refill:  0    Order Specific Question:  Supervising Provider    Answer:  Cassandria Anger [[id number]]     Start medications as prescribed and explained to patient on After Visit Summary ( AVS). Risks, benefits, and alternatives of the medications and treatment plan prescribed today were discussed, and patient expressed understanding.   Education regarding symptom management and diagnosis given to patient.   Follow-up:Plan follow-up as discussed or as needed if any worsening symptoms or change in condition.   Continue to follow with Cathlean Cower, MD for routine health maintenance.   [name] and I agreed with plan.   Mable Paris, FNP

## 2015-09-16 NOTE — Progress Notes (Signed)
Pre visit review using our clinic review tool, if applicable. No additional management support is needed unless otherwise documented below in the visit note. 

## 2015-09-16 NOTE — Patient Instructions (Signed)
Referral to ENT.   Increase intake of clear fluids. Congestion is best treated by hydration, when mucus is wetter, it is thinner, less sticky, and easier to expel from the body, either through coughing up drainage, or by blowing your nose.   Get plenty of rest.   Use saline nasal drops and blow your nose frequently. Run a humidifier at night and elevate the head of the bed. Vicks Vapor rub will help with congestion and cough. Steam showers and sinus massage for congestion.   Use Acetaminophen or Ibuprofen as needed for fever or pain. Avoid second hand smoke. Even the smallest exposure will worsen symptoms.   Over the counter medications you can try include Delsym for cough, a decongestant for congestion, and Mucinex or Robitussin as an expectorant. Be sure to just get the plain Mucinex or Robitussin that just has one medication (Guaifenesen). We don't recommend the combination products. Note, be sure to drink two glasses of water with each dose of Mucinex as the medication will not work well without adequate hydration.   You can also try a teaspoon of honey to see if this will help reduce cough. Throat lozenges can sometimes be beneficial as well.    This illness will typically last 7 - 10 days.   Please follow up with our clinic if you develop a fever greater than 101 F, symptoms worsen, or do not resolve in the next week.

## 2015-10-01 ENCOUNTER — Encounter: Payer: Self-pay | Admitting: Family

## 2015-12-11 DIAGNOSIS — J343 Hypertrophy of nasal turbinates: Secondary | ICD-10-CM | POA: Insufficient documentation

## 2015-12-11 DIAGNOSIS — J310 Chronic rhinitis: Secondary | ICD-10-CM | POA: Insufficient documentation

## 2015-12-11 DIAGNOSIS — J0141 Acute recurrent pansinusitis: Secondary | ICD-10-CM | POA: Insufficient documentation

## 2016-01-13 DIAGNOSIS — J342 Deviated nasal septum: Secondary | ICD-10-CM | POA: Insufficient documentation

## 2016-03-29 ENCOUNTER — Ambulatory Visit (INDEPENDENT_AMBULATORY_CARE_PROVIDER_SITE_OTHER): Payer: BLUE CROSS/BLUE SHIELD | Admitting: Internal Medicine

## 2016-03-29 VITALS — BP 128/78 | HR 94 | Temp 98.7°F | Resp 20 | Wt 198.0 lb

## 2016-03-29 DIAGNOSIS — J019 Acute sinusitis, unspecified: Secondary | ICD-10-CM | POA: Diagnosis not present

## 2016-03-29 MED ORDER — SULFAMETHOXAZOLE-TRIMETHOPRIM 800-160 MG PO TABS: 1.0000 | ORAL_TABLET | Freq: Two times a day (BID) | ORAL | 0 refills | Status: DC

## 2016-03-29 NOTE — Progress Notes (Signed)
   Subjective:    Patient ID: Caitlin Greene, female    DOB: [birth year], 50 y.o.   MRN: [id number]  HPI   Here with 2-3 days acute onset fever, facial pain, pressure, headache, general weakness and malaise, and greenish d/c, with mild ST and cough, but pt denies chest pain, wheezing, increased sob or doe, orthopnea, PND, increased LE swelling, palpitations, dizziness or syncope.  No other new history changes Past Medical History:  Diagnosis Date  . ALLERGIC RHINITIS   . ANEMIA-IRON DEFICIENCY   . ANXIETY   . BREAST CANCER, HX OF 01/21/2007   at 50yo  . DEPRESSION   . GERD   . HYPERLIPIDEMIA   . RENAL CALCULUS    Past Surgical History:  Procedure Laterality Date  . BREAST ENHANCEMENT SURGERY    . MASTECTOMY     right  . TEMPOROMANDIBULAR JOINT SURGERY     Left  . TONSILLECTOMY      reports that she has never smoked. She has never used smokeless tobacco. She reports that she does not drink alcohol or use drugs. family history includes Asthma in her sister; COPD in her father; Cancer in her mother; Colon polyps in her father and mother; Dementia in her mother; Diabetes in her other; Hypertension in her mother and other. Allergies  Allergen Reactions  . Amoxicillin-Pot Clavulanate Hives  . Codeine Nausea And Vomiting    Needs pre-meds   Current Outpatient Prescriptions on File Prior to Visit  Medication Sig Dispense Refill  . cetirizine (ZYRTEC) 10 MG tablet Take 10 mg by mouth daily.    Marland Kitchen FLUoxetine (PROZAC) 20 MG capsule Take 20 mg by mouth daily.    . fluticasone (FLONASE) 50 MCG/ACT nasal spray Place 2 sprays into both nostrils daily. 16 g 11  . naproxen sodium (ANAPROX) 220 MG tablet Take 220 mg by mouth 2 (two) times daily as needed. For pain     No current facility-administered medications on file prior to visit.    Review of Systems .All otherwise neg per pt     Objective:   Physical Exam BP 128/78   Pulse 94   Temp 98.7 F (37.1 C) (Oral)   Resp 20   Wt 198 lb  (89.8 kg)   SpO2 97%   BMI 30.11 kg/m  VS noted, mild ill Constitutional: Pt appears in no apparent distress HENT: Head: NCAT.  Right Ear: External ear normal.  Left Ear: External ear normal.  Eyes: . Pupils are equal, round, and reactive to light. Conjunctivae and EOM are normal Bilat tm's with mild erythema.  Max sinus areas mild to mod tender.  Pharynx with mild erythema, no exudate Neck: Normal range of motion. Neck supple.  Cardiovascular: Normal rate and regular rhythm.   Pulmonary/Chest: Effort normal and breath sounds without rales or wheezing.  Neurological: Pt is alert. Not confused , motor grossly intact Skin: Skin is warm. No rash, no LE edema Psychiatric: Pt behavior is normal. No agitation.     Assessment & Plan:

## 2016-03-29 NOTE — Assessment & Plan Note (Signed)
Mild to mod, for antibx course,  to f/u any worsening symptoms or concerns 

## 2016-03-29 NOTE — Progress Notes (Signed)
Pre visit review using our clinic review tool, if applicable. No additional management support is needed unless otherwise documented below in the visit note. 

## 2016-03-29 NOTE — Patient Instructions (Signed)
Please take all new medication as prescribed - the antibiotic  Please continue all other medications as before, and refills have been done if requested.  Please have the pharmacy call with any other refills you may need.  Please keep your appointments with your specialists as you may have planned   

## 2016-04-14 ENCOUNTER — Ambulatory Visit (HOSPITAL_BASED_OUTPATIENT_CLINIC_OR_DEPARTMENT_OTHER): Payer: BLUE CROSS/BLUE SHIELD | Admitting: Hematology & Oncology

## 2016-04-14 VITALS — BP 120/80 | HR 88 | Temp 97.8°F | Resp 16 | Wt 200.0 lb

## 2016-04-14 DIAGNOSIS — F419 Anxiety disorder, unspecified: Secondary | ICD-10-CM | POA: Diagnosis not present

## 2016-04-14 DIAGNOSIS — M25552 Pain in left hip: Secondary | ICD-10-CM

## 2016-04-14 DIAGNOSIS — Z853 Personal history of malignant neoplasm of breast: Secondary | ICD-10-CM | POA: Diagnosis not present

## 2016-04-14 NOTE — Progress Notes (Signed)
Hematology and Oncology Follow Up Visit  Caitlin Greene [id number] [birth year] 50 y.o. 04/14/2016   Principle Diagnosis:   Stage I ductal carcinoma of the right breast - BRCA (-)/ER-  Current Therapy:    Observation     Interim History:  Caitlin Greene is back for follow-up. We saw her a couple years ago. She is having some problems right now. She is having some pain in the left hip. She's had this for several months. She's had no x-ray test as of yet. It is becoming difficult for her to walk. We may have to get some x-rays. She is at risk for metastatic disease/breast cancer recurrence.  Of note, her husband has metastatic melanoma. He is having a tough time. She is doing a good job helping with him.  She has had no fever. She has had quite a lot of anxiety and emotional stress. Some of this has to do with the possibility of her having recurrent disease.  She does not have her monthly cycles. She does not have any cough. She's had no change in bowel or bladder habits.  Overall, her performance status is ECOG 2.  Medications:  Current Outpatient Prescriptions:  .  cetirizine (ZYRTEC) 10 MG tablet, Take 10 mg by mouth daily., Disp: , Rfl:  .  FLUoxetine (PROZAC) 20 MG capsule, Take 20 mg by mouth daily., Disp: , Rfl:  .  fluticasone (FLONASE) 50 MCG/ACT nasal spray, Place 2 sprays into both nostrils daily., Disp: 16 g, Rfl: 11 .  Meclizine HCl 25 MG CHEW, Chew 25 mg by mouth 3 (three) times daily as needed., Disp: , Rfl:  .  naproxen sodium (ANAPROX) 220 MG tablet, Take 220 mg by mouth 2 (two) times daily as needed. For pain, Disp: , Rfl:   Allergies:  Allergies  Allergen Reactions  . Amoxicillin-Pot Clavulanate Hives  . Codeine Nausea And Vomiting    Needs pre-meds Needs pre-meds    Past Medical History, Surgical history, Social history, and Family History were reviewed and updated.  Review of Systems:  As above  Physical Exam:  weight is 200 lb (90.7 kg). Her oral  temperature is 97.8 F (36.6 C). Her blood pressure is 120/80 and her pulse is 88. Her respiration is 16.   Wt Readings from Last 3 Encounters:  04/14/16 200 lb (90.7 kg)  03/29/16 198 lb (89.8 kg)  09/16/15 203 lb (92.1 kg)      Well-developed and well-nourished white female in no obvious distress. Head exam shows no ocular or oral lesions. She has no palpable cervical or supraclavicular lymph nodes. Lungs are clear bilaterally. Cardiac exam regular rate and rhythm with no murmurs, rubs or bruits. Breast exam shows left breast with surgical scars from past reduction. No distinct masses noted in the left breast. There is no left axillary adenopathy. Right chest wall shows a well-healed mastectomy. She has an implant. This is intact. She has no right axillary adenopathy. Abdomen is soft. She has good bowel sounds. There is no fluid wave. There is no palpable liver or spleen tip. Back exam shows some tenderness over the left hip. This is of the lateral hip. She has some decreased range of motion. Right hip is unremarkable. Extremities shows no clubbing, cyanosis or edema. Neurological exam shows no focal neurological deficits. Skin exam shows no rashes, ecchymoses or petechia.  Lab Results  Component Value Date   WBC 9.1 06/04/2014   HGB 14.1 06/04/2014   HCT 40.6 06/04/2014   MCV 87.9  06/04/2014   PLT 316.0 06/04/2014     Chemistry      Component Value Date/Time   NA 138 06/04/2014 1359   K 3.6 06/04/2014 1359   CL 104 06/04/2014 1359   CO2 26 06/04/2014 1359   BUN 19 06/04/2014 1359   CREATININE 0.91 06/04/2014 1359   CREATININE 0.84 04/21/2014 1125      Component Value Date/Time   CALCIUM 9.3 06/04/2014 1359   ALKPHOS 50 04/21/2014 1125   AST 14 04/21/2014 1125   ALT 15 04/21/2014 1125   BILITOT 0.6 04/21/2014 1125         Impression and Plan: Caitlin Greene is a 50 year old white female. She has a history of ductal carcinoma of the right breast. She had this several years  ago.  We will have to evaluate her for this left hip issue. This certainly could be quite problematic.  She just is having a very difficult time right now. She is having physical problems with the hip. She is having some emotional issues with trying to deal with her past cancer, possible recurrence, and also trying to help her husband.  I think that she really would benefit from some time off from more. I think that she certainly would qualify for short-term disability given what she is going through right now. We will try to help her out so that she can try to improve her quality of life and try to improve her psychological state.  I would like to see her back in about 3 months. I this would be reasonable. We can get her back sooner depending on what we find with the x-rays.  [name], MD 12/7/20175:46 PM

## 2016-04-29 ENCOUNTER — Other Ambulatory Visit: Payer: Self-pay | Admitting: Internal Medicine

## 2016-04-29 NOTE — Telephone Encounter (Signed)
Done erx - prozac

## 2016-05-05 ENCOUNTER — Encounter: Payer: Self-pay | Admitting: *Deleted

## 2016-05-05 NOTE — Progress Notes (Signed)
[facility] Psychosocial Distress Screening Clinical Social Work  Clinical Social Work was referred by distress screening protocol.  The patient scored a 10 on the Psychosocial Distress Thermometer which indicates severe distress. Clinical Social Worker reviewed chart to assess for distress and other psychosocial needs. Pt was seen for follow up. Not in active treatment, no new appointments or other follow up noted in [facility] currently. Pt on medication for her ongoing depression. Physical symptoms appear addressed by MD. CSW available if additional needs arise.   ONCBCN DISTRESS SCREENING 04/14/2016  Screening Type Change in Status  Distress experienced in past week (1-10) 10  Practical problem type Work/school  Family Problem type Partner  Emotional problem type Depression;Nervousness/Anxiety;Adjusting to illness;Feeling hopeless;Isolation/feeling alone  Physical Problem type Sleep/insomnia;Pain;Loss of appetitie;Constipation/diarrhea;Other (comment)  Physician notified of physical symptoms Yes  Referral to clinical psychology Yes  Referral to clinical social work Yes  Referral to palliative care Yes    Clinical Social Worker follow up needed: No.  If yes, follow up plan:  Loren Racer, [place]  [facility] Phone: [phone] Fax: [phone]

## 2016-09-21 ENCOUNTER — Ambulatory Visit (INDEPENDENT_AMBULATORY_CARE_PROVIDER_SITE_OTHER): Payer: 59 | Admitting: Internal Medicine

## 2016-09-21 ENCOUNTER — Encounter: Payer: Self-pay | Admitting: Internal Medicine

## 2016-09-21 VITALS — BP 124/86 | HR 78 | Ht 68.75 in | Wt 206.0 lb

## 2016-09-21 DIAGNOSIS — J329 Chronic sinusitis, unspecified: Secondary | ICD-10-CM | POA: Diagnosis not present

## 2016-09-21 DIAGNOSIS — F411 Generalized anxiety disorder: Secondary | ICD-10-CM

## 2016-09-21 DIAGNOSIS — J309 Allergic rhinitis, unspecified: Secondary | ICD-10-CM

## 2016-09-21 MED ORDER — TRIAMCINOLONE ACETONIDE 55 MCG/ACT NA AERO: 2.0000 | INHALATION_SPRAY | Freq: Every day | NASAL | 12 refills | Status: DC

## 2016-09-21 MED ORDER — FLUOXETINE HCL 20 MG PO CAPS: 20.0000 mg | ORAL_CAPSULE | Freq: Every day | ORAL | 3 refills | Status: DC

## 2016-09-21 MED ORDER — SULFAMETHOXAZOLE-TRIMETHOPRIM 800-160 MG PO TABS: 1.0000 | ORAL_TABLET | Freq: Two times a day (BID) | ORAL | 0 refills | Status: DC

## 2016-09-21 NOTE — Patient Instructions (Signed)
Please take all new medication as prescribed - the antibiotic  OK to change the flonase to nasacort   Please continue all other medications as before, and refills have been done if requested - the prozac  Please have the pharmacy call with any other refills you may need..  Please keep your appointments with your specialists as you may have planned

## 2016-09-24 NOTE — Assessment & Plan Note (Signed)
Stable, Mild to mod, declines counseling referral or change in tx,  to f/u any worsening symptoms or concerns

## 2016-09-24 NOTE — Assessment & Plan Note (Signed)
With acute on chronic, Mild to mod, for antibx course,  to f/u any worsening symptoms or concerns

## 2016-09-24 NOTE — Progress Notes (Signed)
Subjective:    Patient ID: Caitlin Greene, female    DOB: [birth year], 51 y.o.   MRN: [id number]  HPI  , Here with 2-3 days acute onset fever, facial pain, pressure, headache, general weakness and malaise, and greenish d/c, with mild ST and cough, but pt denies chest pain, wheezing, increased sob or doe, orthopnea, PND, increased LE swelling, palpitations, dizziness or syncope.  Does have several wks ongoing nasal allergy symptoms with clearish congestion, itch and sneezing, without fever, pain, ST, cough, swelling or wheezing.  Denies worsening depressive symptoms, suicidal ideation, or panic; has ongoing anxiety, stable recently.  Husband died recently but coping ok Past Medical History:  Diagnosis Date  . ALLERGIC RHINITIS   . ANEMIA-IRON DEFICIENCY   . ANXIETY   . BREAST CANCER, HX OF 01/21/2007   at 51yo  . DEPRESSION   . GERD   . HYPERLIPIDEMIA   . RENAL CALCULUS    Past Surgical History:  Procedure Laterality Date  . BREAST ENHANCEMENT SURGERY    . MASTECTOMY     right  . TEMPOROMANDIBULAR JOINT SURGERY     Left  . TONSILLECTOMY      reports that she has never smoked. She has never used smokeless tobacco. She reports that she does not drink alcohol or use drugs. family history includes Asthma in her sister; COPD in her father; Cancer in her mother; Colon polyps in her father and mother; Dementia in her mother; Diabetes in her other; Hypertension in her mother and other. Allergies  Allergen Reactions  . Amoxicillin-Pot Clavulanate Hives  . Codeine Nausea And Vomiting    Needs pre-meds Needs pre-meds   Current Outpatient Prescriptions on File Prior to Visit  Medication Sig Dispense Refill  . cetirizine (ZYRTEC) 10 MG tablet Take 10 mg by mouth daily.    . Meclizine HCl 25 MG CHEW Chew 25 mg by mouth 3 (three) times daily as needed.    . naproxen sodium (ANAPROX) 220 MG tablet Take 220 mg by mouth 2 (two) times daily as needed. For pain     No current facility-administered  medications on file prior to visit.    Review of Systems  Constitutional: Negative for other unusual diaphoresis or sweats HENT: Negative for ear discharge or swelling Eyes: Negative for other worsening visual disturbances Respiratory: Negative for stridor or other swelling  Gastrointestinal: Negative for worsening distension or other blood Genitourinary: Negative for retention or other urinary change Musculoskeletal: Negative for other MSK pain or swelling Skin: Negative for color change or other new lesions Neurological: Negative for worsening tremors and other numbness  Psychiatric/Behavioral: Negative for worsening agitation or other fatigue All other system neg per pt    Objective:   Physical Exam BP 124/86   Pulse 78   Ht 5' 8.75" (1.746 m)   Wt 206 lb (93.4 kg)   SpO2 98%   BMI 30.64 kg/m  VS noted,  Constitutional: Pt appears in NAD HENT: Head: NCAT.  Right Ear: External ear normal.  Left Ear: External ear normal.  Eyes: . Pupils are equal, round, and reactive to light. Conjunctivae and EOM are normal Bilat tm's with mild erythema.  Max sinus areas mild tender.  Pharynx with mild erythema, no exudate Nose: without d/c or deformity Neck: Neck supple. Gross normal ROM Cardiovascular: Normal rate and regular rhythm.   Pulmonary/Chest: Effort normal and breath sounds without rales or wheezing.  Neurological: Pt is alert. At baseline orientation, motor grossly intact Skin: Skin is warm. No  rashes, other new lesions, no LE edema Psychiatric: Pt behavior is normal without agitation  No other exam findings    Assessment & Plan:

## 2016-09-24 NOTE — Assessment & Plan Note (Signed)
Ok to change flonase to nasacort for less chance of nosebleeds,  to f/u any worsening symptoms or concerns

## 2017-03-23 ENCOUNTER — Ambulatory Visit (INDEPENDENT_AMBULATORY_CARE_PROVIDER_SITE_OTHER): Payer: 59 | Admitting: Internal Medicine

## 2017-03-23 ENCOUNTER — Encounter: Payer: Self-pay | Admitting: Internal Medicine

## 2017-03-23 VITALS — BP 122/84 | HR 90 | Temp 97.6°F | Ht 68.75 in | Wt 208.0 lb

## 2017-03-23 DIAGNOSIS — J019 Acute sinusitis, unspecified: Secondary | ICD-10-CM

## 2017-03-23 DIAGNOSIS — Z23 Encounter for immunization: Secondary | ICD-10-CM | POA: Diagnosis not present

## 2017-03-23 MED ORDER — AZITHROMYCIN 250 MG PO TABS: ORAL_TABLET | ORAL | 1 refills | Status: DC

## 2017-03-23 NOTE — Patient Instructions (Addendum)
You had the flu shot today  Please take all new medication as prescribed - the antibiotic  Please continue all other medications as before, and refills have been done if requested.  Please have the pharmacy call with any other refills you may need.  Please keep your appointments with your specialists as you may have planned     

## 2017-03-23 NOTE — Progress Notes (Signed)
   Subjective:    Patient ID: Caitlin Greene, female    DOB: May 14, 1965, 51 y.o.   MRN: [id number]  HPI   Here with 2-3 days acute onset fever, facial pain, pressure, headache, general weakness and malaise, and greenish d/c, with mild ST and cough, but pt denies chest pain, wheezing, increased sob or doe, orthopnea, PND, increased LE swelling, palpitations, dizziness or syncope.  Due for flu shot Past Medical History:  Diagnosis Date  . ALLERGIC RHINITIS   . ANEMIA-IRON DEFICIENCY   . ANXIETY   . BREAST CANCER, HX OF 01/21/2007   at 51yo  . DEPRESSION   . GERD   . HYPERLIPIDEMIA   . RENAL CALCULUS    Past Surgical History:  Procedure Laterality Date  . BREAST ENHANCEMENT SURGERY    . MASTECTOMY     right  . TEMPOROMANDIBULAR JOINT SURGERY     Left  . TONSILLECTOMY      reports that  has never smoked. she has never used smokeless tobacco. She reports that she does not drink alcohol or use drugs. family history includes Asthma in her sister; COPD in her father; Cancer in her mother; Colon polyps in her father and mother; Dementia in her mother; Diabetes in her other; Hypertension in her mother and other. Allergies  Allergen Reactions  . Amoxicillin-Pot Clavulanate Hives  . Codeine Nausea And Vomiting    Needs pre-meds Needs pre-meds   Current Outpatient Medications on File Prior to Visit  Medication Sig Dispense Refill  . cetirizine (ZYRTEC) 10 MG tablet Take 10 mg by mouth daily.    Marland Kitchen FLUoxetine (PROZAC) 20 MG capsule Take 1 capsule (20 mg total) by mouth daily. 90 capsule 3  . Meclizine HCl 25 MG CHEW Chew 25 mg by mouth 3 (three) times daily as needed.    . naproxen sodium (ANAPROX) 220 MG tablet Take 220 mg by mouth 2 (two) times daily as needed. For pain    . sulfamethoxazole-trimethoprim (BACTRIM DS,SEPTRA DS) 800-160 MG tablet Take 1 tablet by mouth 2 (two) times daily. 28 tablet 0  . triamcinolone (NASACORT) 55 MCG/ACT AERO nasal inhaler Place 2 sprays into the nose  daily. 1 Inhaler 12   No current facility-administered medications on file prior to visit.    Review of Systems All otherwise neg per pt     Objective:   Physical Exam BP 122/84   Pulse 90   Temp 97.6 F (36.4 C) (Oral)   Ht 5' 8.75" (1.746 m)   Wt 208 lb (94.3 kg)   SpO2 99%   BMI 30.94 kg/m  VS noted, mild ill Constitutional: Pt appears in NAD HENT: Head: NCAT.  Right Ear: External ear normal.  Left Ear: External ear normal.  Eyes: . Pupils are equal, round, and reactive to light. Conjunctivae and EOM are normal Nose: without d/c or deformity Bilat tm's with mild erythema.  Max sinus areas mild tender.  Pharynx with mild erythema, no exudate Neck: Neck supple. Gross normal ROM Cardiovascular: Normal rate and regular rhythm.   Pulmonary/Chest: Effort normal and breath sounds without rales or wheezing.  Neurological: Pt is alert. At baseline orientation, motor grossly intact Skin: Skin is warm. No rashes, other new lesions, no LE edema Psychiatric: Pt behavior is normal without agitation  No other exam findings    Assessment & Plan:

## 2017-03-26 NOTE — Assessment & Plan Note (Signed)
Mild to mod, for antibx course,  to f/u any worsening symptoms or concerns 

## 2017-06-16 ENCOUNTER — Other Ambulatory Visit: Payer: Self-pay

## 2017-06-16 ENCOUNTER — Encounter: Payer: Self-pay | Admitting: Emergency Medicine

## 2017-06-16 ENCOUNTER — Emergency Department (INDEPENDENT_AMBULATORY_CARE_PROVIDER_SITE_OTHER)
Admission: EM | Admit: 2017-06-16 | Discharge: 2017-06-16 | Disposition: A | Discharge status: Home / Self Care | Payer: 59 | Source: Home / Self Care | Attending: Family Medicine | Admitting: Family Medicine

## 2017-06-16 DIAGNOSIS — J069 Acute upper respiratory infection, unspecified: Secondary | ICD-10-CM | POA: Diagnosis not present

## 2017-06-16 MED ORDER — BENZONATATE 100 MG PO CAPS: 100.0000 mg | ORAL_CAPSULE | Freq: Three times a day (TID) | ORAL | 0 refills | Status: DC

## 2017-06-16 MED ORDER — FLUTICASONE PROPIONATE 50 MCG/ACT NA SUSP: 2.0000 | Freq: Every day | NASAL | 2 refills | Status: DC

## 2017-06-16 MED ORDER — AZITHROMYCIN 250 MG PO TABS: 250.0000 mg | ORAL_TABLET | Freq: Every day | ORAL | 0 refills | Status: DC

## 2017-06-16 NOTE — Discharge Instructions (Signed)
°  You may take 500mg acetaminophen every 4-6 hours or in combination with ibuprofen 400-600mg every 6-8 hours as needed for pain, inflammation, and fever. ° °Be sure to drink at least eight 8oz glasses of water to stay well hydrated and get at least 8 hours of sleep at night, preferably more while sick.  ° °Please take antibiotics as prescribed and be sure to complete entire course even if you start to feel better to ensure infection does not come back. ° °

## 2017-06-16 NOTE — ED Triage Notes (Signed)
Patient reports sore throat, congestion and cough for 5 days.

## 2017-06-16 NOTE — ED Provider Notes (Signed)
[facility]    CSN: [id number] Arrival date & time: 06/16/17  1831     History   Chief Complaint Chief Complaint  Patient presents with  . Sore Throat  . Nasal Congestion  . Cough    HPI Caitlin Greene is a 52 y.o. female.   HPI  Caitlin Greene is a 52 y.o. female presenting to UC with c/o worsening sore throat, PND, productive cough with yellow sputum for 5 days. Associated frontal headache and bilateral ear pressure.  Others around her have been sick. Denies fever, chills, n/v/d.    Past Medical History:  Diagnosis Date  . ALLERGIC RHINITIS   . ANEMIA-IRON DEFICIENCY   . ANXIETY   . BREAST CANCER, HX OF 01/21/2007   at 52yo  . DEPRESSION   . GERD   . HYPERLIPIDEMIA   . RENAL CALCULUS     Patient Active Problem List   Diagnosis Date Noted  . Deviated septum 01/13/2016  . Nasal turbinate hypertrophy 12/11/2015  . Rhinitis, chronic 12/11/2015  . Acute sinus infection 07/30/2014  . LLL pneumonia ([place]) 06/04/2014  . Chronic sinusitis 01/25/2012  . Vertigo 01/25/2012  . ETD (eustachian tube dysfunction) 10/01/2011  . Preventative health care 11/17/2010  . HEMATOCHEZIA 05/31/2010  . ANEMIA-IRON DEFICIENCY 10/11/2007  . GERD 10/11/2007  . HYPERLIPIDEMIA 01/21/2007  . Anxiety state 01/21/2007  . Depression 01/21/2007  . Allergic rhinitis 01/21/2007  . RENAL CALCULUS 01/21/2007  . BREAST CANCER, HX OF 01/21/2007    Past Surgical History:  Procedure Laterality Date  . BREAST ENHANCEMENT SURGERY    . MASTECTOMY     right  . TEMPOROMANDIBULAR JOINT SURGERY     Left  . TONSILLECTOMY      OB History    No data available       Home Medications    Prior to Admission medications   Medication Sig Start Date End Date Taking? Authorizing Provider  azithromycin (ZITHROMAX) 250 MG tablet Take 1 tablet (250 mg total) by mouth daily. Take first 2 tablets together, then 1 every day until finished. 06/16/17   Noe Gens, PA-C  benzonatate (TESSALON)  100 MG capsule Take 1-2 capsules (100-200 mg total) by mouth every 8 (eight) hours. 06/16/17   Noe Gens, PA-C  cetirizine (ZYRTEC) 10 MG tablet Take 10 mg by mouth daily.    [provider]  FLUoxetine (PROZAC) 20 MG capsule Take 1 capsule (20 mg total) by mouth daily. 09/21/16   Biagio Borg, MD  fluticasone (FLONASE) 50 MCG/ACT nasal spray Place 2 sprays into both nostrils daily. 06/16/17   Noe Gens, PA-C  Meclizine HCl 25 MG CHEW Chew 25 mg by mouth 3 (three) times daily as needed.    [provider]  naproxen sodium (ANAPROX) 220 MG tablet Take 220 mg by mouth 2 (two) times daily as needed. For pain    [provider]  sulfamethoxazole-trimethoprim (BACTRIM DS,SEPTRA DS) 800-160 MG tablet Take 1 tablet by mouth 2 (two) times daily. 09/21/16   Biagio Borg, MD  triamcinolone (NASACORT) 55 MCG/ACT AERO nasal inhaler Place 2 sprays into the nose daily. 09/21/16   Biagio Borg, MD    Family History Family History  Problem Relation Age of Onset  . Colon polyps Mother   . Cancer Mother        Uterine Cancer  . Hypertension Mother   . Dementia Mother   . Colon polyps Father   . COPD Father  smoked  . Hypertension Other   . Diabetes Other   . Asthma Sister     Social History Social History   Tobacco Use  . Smoking status: Never Smoker  . Smokeless tobacco: Never Used  Substance Use Topics  . Alcohol use: No    Alcohol/week: 0.0 oz  . Drug use: No     Allergies   Amoxicillin-pot clavulanate and Codeine   Review of Systems Review of Systems  Constitutional: Negative for chills and fever.  HENT: Positive for congestion, ear pain ( fullness), postnasal drip, rhinorrhea and sore throat. Negative for trouble swallowing and voice change.   Respiratory: Positive for cough. Negative for shortness of breath.   Cardiovascular: Negative for chest pain and palpitations.  Gastrointestinal: Negative for abdominal pain, diarrhea, nausea and  vomiting.  Musculoskeletal: Negative for arthralgias, back pain and myalgias.  Skin: Negative for rash.  Neurological: Positive for headaches. Negative for dizziness and light-headedness.     Physical Exam Triage Vital Signs ED Triage Vitals  Enc Vitals Group     BP 06/16/17 1916 134/89     Pulse Rate 06/16/17 1916 78     Resp 06/16/17 1916 16     Temp 06/16/17 1916 98.7 F (37.1 C)     Temp Source 06/16/17 1916 Oral     SpO2 06/16/17 1916 100 %     Weight 06/16/17 1917 206 lb (93.4 kg)     Height 06/16/17 1917 5\' 9"  (1.753 m)     Head Circumference --      Peak Flow --      Pain Score --      Pain Loc --      Pain Edu? --      Excl. in [place]? --    No data found.  Updated Vital Signs BP 134/89 (BP Location: Right Arm)   Pulse 78   Temp 98.7 F (37.1 C) (Oral)   Resp 16   Ht 5\' 9"  (1.753 m)   Wt 206 lb (93.4 kg)   LMP 06/09/2017 (Exact Date)   SpO2 100%   BMI 30.42 kg/m   Visual Acuity Right Eye Distance:   Left Eye Distance:   Bilateral Distance:    Right Eye Near:   Left Eye Near:    Bilateral Near:     Physical Exam  Constitutional: She is oriented to person, place, and time. She appears well-developed and well-nourished.  Non-toxic appearance. She does not appear ill. No distress.  HENT:  Head: Normocephalic and atraumatic.  Right Ear: A middle ear effusion is present.  Left Ear: Tympanic membrane is injected. A middle ear effusion is present.  Nose: Mucosal edema present. Right sinus exhibits no maxillary sinus tenderness and no frontal sinus tenderness. Left sinus exhibits no maxillary sinus tenderness and no frontal sinus tenderness.  Mouth/Throat: Uvula is midline and mucous membranes are normal. Posterior oropharyngeal erythema present. No oropharyngeal exudate, posterior oropharyngeal edema or tonsillar abscesses.  Eyes: EOM are normal.  Neck: Normal range of motion. Neck supple.  Cardiovascular: Normal rate and regular rhythm.  Pulmonary/Chest:  Effort normal and breath sounds normal. No stridor. No respiratory distress. She has no wheezes. She has no rhonchi.  Musculoskeletal: Normal range of motion.  Lymphadenopathy:    She has no cervical adenopathy.  Neurological: She is alert and oriented to person, place, and time.  Skin: Skin is warm and dry.  Psychiatric: She has a normal mood and affect. Her behavior is normal.  Nursing note  and vitals reviewed.    UC Treatments / Results  Labs (all labs ordered are listed, but only abnormal results are displayed) Labs Reviewed - No data to display  EKG  EKG Interpretation None       Radiology No results found.  Procedures Procedures (including critical care time)  Medications Ordered in UC Medications - No data to display   Initial Impression / Assessment and Plan / UC Course  I have reviewed the triage vital signs and the nursing notes.  Pertinent labs & imaging results that were available during my care of the patient were reviewed by me and considered in my medical decision making (see chart for details).     Worsening sore throat and cough with yellow sputum for 1 week.  Will cover for atypical bacteria Encouraged f/u with PCP in 7-10 days if not improving   Final Clinical Impressions(s) / UC Diagnoses   Final diagnoses:  Upper respiratory tract infection, unspecified type    ED Discharge Orders        Ordered    benzonatate (TESSALON) 100 MG capsule  Every 8 hours     06/16/17 1922    azithromycin (ZITHROMAX) 250 MG tablet  Daily     06/16/17 1922    fluticasone (FLONASE) 50 MCG/ACT nasal spray  Daily     06/16/17 1922       Controlled Substance Prescriptions [place] Controlled Substance Registry consulted? Not Applicable   Tyrell Antonio 06/16/17 1926

## 2017-09-24 ENCOUNTER — Emergency Department (INDEPENDENT_AMBULATORY_CARE_PROVIDER_SITE_OTHER)
Admission: EM | Admit: 2017-09-24 | Discharge: 2017-09-24 | Disposition: A | Discharge status: Home / Self Care | Payer: 59 | Source: Home / Self Care | Attending: Family Medicine | Admitting: Family Medicine

## 2017-09-24 DIAGNOSIS — R350 Frequency of micturition: Secondary | ICD-10-CM

## 2017-09-24 DIAGNOSIS — J0110 Acute frontal sinusitis, unspecified: Secondary | ICD-10-CM

## 2017-09-24 LAB — POCT URINALYSIS DIP (MANUAL ENTRY)
Bilirubin, UA: NEGATIVE
Blood, UA: NEGATIVE
Glucose, UA: NEGATIVE mg/dL
Ketones, POC UA: NEGATIVE mg/dL — AB
Nitrite, UA: NEGATIVE
Protein Ur, POC: NEGATIVE mg/dL
Spec Grav, UA: >=1.03 — AB (ref 1.010–1.025)
Urobilinogen, UA: 0.2 E.U./dL
pH, UA: 5.5 (ref 5.0–8.0)

## 2017-09-24 MED ORDER — DOXYCYCLINE HYCLATE 100 MG PO CAPS: 100.0000 mg | ORAL_CAPSULE | Freq: Two times a day (BID) | ORAL | 0 refills | Status: DC

## 2017-09-24 NOTE — ED Provider Notes (Signed)
[facility]    CSN: [id number] Arrival date & time: 09/24/17  1248     History   Chief Complaint Chief Complaint  Patient presents with  . Facial Pain  . Urinary Frequency    HPI Caitlin Greene is a 52 y.o. female.   HPI Caitlin Greene is a 52 y.o. female presenting to UC with c/o 2 weeks of worsening sinus congestion with facial pain and now thick green sputum for the last few days.  She is also c/o urinary frequency.  She has been using Flonase and tylenol with mild relief. Denies known fever. No n/v/d. No known sick contacts. Denies abdominal pain or back pain.    Past Medical History:  Diagnosis Date  . ALLERGIC RHINITIS   . ANEMIA-IRON DEFICIENCY   . ANXIETY   . BREAST CANCER, HX OF 01/21/2007   at 52yo  . DEPRESSION   . GERD   . HYPERLIPIDEMIA   . RENAL CALCULUS     Patient Active Problem List   Diagnosis Date Noted  . Deviated septum 01/13/2016  . Nasal turbinate hypertrophy 12/11/2015  . Rhinitis, chronic 12/11/2015  . Acute sinus infection 07/30/2014  . LLL pneumonia ([place]) 06/04/2014  . Chronic sinusitis 01/25/2012  . Vertigo 01/25/2012  . ETD (eustachian tube dysfunction) 10/01/2011  . Preventative health care 11/17/2010  . HEMATOCHEZIA 05/31/2010  . ANEMIA-IRON DEFICIENCY 10/11/2007  . GERD 10/11/2007  . HYPERLIPIDEMIA 01/21/2007  . Anxiety state 01/21/2007  . Depression 01/21/2007  . Allergic rhinitis 01/21/2007  . RENAL CALCULUS 01/21/2007  . BREAST CANCER, HX OF 01/21/2007    Past Surgical History:  Procedure Laterality Date  . BREAST ENHANCEMENT SURGERY    . MASTECTOMY     right  . TEMPOROMANDIBULAR JOINT SURGERY     Left  . TONSILLECTOMY      OB History   None      Home Medications    Prior to Admission medications   Medication Sig Start Date End Date Taking? Authorizing Provider  cetirizine (ZYRTEC) 10 MG tablet Take 10 mg by mouth daily.   Yes [provider]  FLUoxetine (PROZAC) 20 MG capsule Take 1  capsule (20 mg total) by mouth daily. 09/21/16  Yes Biagio Borg, MD  azithromycin (ZITHROMAX) 250 MG tablet Take 1 tablet (250 mg total) by mouth daily. Take first 2 tablets together, then 1 every day until finished. 06/16/17   Noe Gens, PA-C  benzonatate (TESSALON) 100 MG capsule Take 1-2 capsules (100-200 mg total) by mouth every 8 (eight) hours. 06/16/17   Noe Gens, PA-C  doxycycline (VIBRAMYCIN) 100 MG capsule Take 1 capsule (100 mg total) by mouth 2 (two) times daily. One po bid x 7 days 09/24/17   Noe Gens, PA-C  fluticasone [facility]) 50 MCG/ACT nasal spray Place 2 sprays into both nostrils daily. 06/16/17   Noe Gens, PA-C  Meclizine HCl 25 MG CHEW Chew 25 mg by mouth 3 (three) times daily as needed.    [provider]  naproxen sodium (ANAPROX) 220 MG tablet Take 220 mg by mouth 2 (two) times daily as needed. For pain    [provider]  sulfamethoxazole-trimethoprim (BACTRIM DS,SEPTRA DS) 800-160 MG tablet Take 1 tablet by mouth 2 (two) times daily. 09/21/16   Biagio Borg, MD  triamcinolone (NASACORT) 55 MCG/ACT AERO nasal inhaler Place 2 sprays into the nose daily. 09/21/16   Biagio Borg, MD    Family History Family History  Problem  Relation Age of Onset  . Colon polyps Mother   . Cancer Mother        Uterine Cancer  . Hypertension Mother   . Dementia Mother   . Colon polyps Father   . COPD Father        smoked  . Hypertension Other   . Diabetes Other   . Asthma Sister     Social History Social History   Tobacco Use  . Smoking status: Never Smoker  . Smokeless tobacco: Never Used  Substance Use Topics  . Alcohol use: No    Alcohol/week: 0.0 oz  . Drug use: No     Allergies   Amoxicillin-pot clavulanate and Codeine   Review of Systems Review of Systems  Constitutional: Negative for chills and fever.  HENT: Positive for congestion, postnasal drip, sinus pressure and sinus pain. Negative for ear pain and sore throat.     Respiratory: Positive for cough ( minimal).   Gastrointestinal: Negative for abdominal pain, diarrhea, nausea and vomiting.  Genitourinary: Positive for frequency. Negative for dysuria, hematuria and urgency.  Musculoskeletal: Negative for back pain and myalgias.  Neurological: Positive for headaches. Negative for dizziness and light-headedness.     Physical Exam Triage Vital Signs ED Triage Vitals  Enc Vitals Group     BP 09/24/17 1341 118/83     Pulse Rate 09/24/17 1341 80     Resp 09/24/17 1341 16     Temp 09/24/17 1341 98.5 F (36.9 C)     Temp Source 09/24/17 1341 Oral     SpO2 09/24/17 1341 96 %     Weight 09/24/17 1342 200 lb 12 oz (91.1 kg)     Height 09/24/17 1342 5' 8.5" (1.74 m)     Head Circumference --      Peak Flow --      Pain Score 09/24/17 1341 8     Pain Loc --      Pain Edu? --      Excl. in [place]? --    No data found.  Updated Vital Signs BP 118/83 (BP Location: Right Arm)   Pulse 80   Temp 98.5 F (36.9 C) (Oral)   Resp 16   Ht 5' 8.5" (1.74 m)   Wt 200 lb 12 oz (91.1 kg)   SpO2 96%   BMI 30.08 kg/m   Visual Acuity Right Eye Distance:   Left Eye Distance:   Bilateral Distance:    Right Eye Near:   Left Eye Near:    Bilateral Near:     Physical Exam  Constitutional: She is oriented to person, place, and time. She appears well-developed and well-nourished. No distress.  HENT:  Head: Normocephalic and atraumatic.  Right Ear: Tympanic membrane normal.  Left Ear: Tympanic membrane normal.  Nose: Mucosal edema present. Right sinus exhibits maxillary sinus tenderness and frontal sinus tenderness. Left sinus exhibits maxillary sinus tenderness and frontal sinus tenderness.  Mouth/Throat: Uvula is midline, oropharynx is clear and moist and mucous membranes are normal.  Eyes: EOM are normal.  Neck: Normal range of motion. Neck supple.  Cardiovascular: Normal rate and regular rhythm.  Pulmonary/Chest: Effort normal and breath sounds normal. No  stridor. No respiratory distress. She has no wheezes. She has no rales.  Abdominal: Soft. She exhibits no distension. There is no tenderness. There is no CVA tenderness.  Musculoskeletal: Normal range of motion.  Neurological: She is alert and oriented to person, place, and time.  Skin: Skin is warm and  dry. She is not diaphoretic.  Psychiatric: She has a normal mood and affect. Her behavior is normal.  Nursing note and vitals reviewed.    UC Treatments / Results  Labs (all labs ordered are listed, but only abnormal results are displayed) Labs Reviewed  URINE CULTURE  POCT URINALYSIS DIP (MANUAL ENTRY)    EKG None  Radiology No results found.  Procedures Procedures (including critical care time)  Medications Ordered in UC Medications - No data to display  Initial Impression / Assessment and Plan / UC Course  I have reviewed the triage vital signs and the nursing notes.  Pertinent labs & imaging results that were available during my care of the patient were reviewed by me and considered in my medical decision making (see chart for details).     Hx and exam c/w sinusitis UA not c/w UTI Will tx for sinusitis Home info packet provided F/u with PCP in 1 week if needed.   Final Clinical Impressions(s) / UC Diagnoses   Final diagnoses:  Acute non-recurrent frontal sinusitis  Urinary frequency   Discharge Instructions   None    ED Prescriptions    Medication Sig Dispense Auth. Provider   doxycycline (VIBRAMYCIN) 100 MG capsule Take 1 capsule (100 mg total) by mouth 2 (two) times daily. One po bid x 7 days 14 capsule Noe Gens, [place]     Controlled Substance Prescriptions [place] Controlled Substance Registry consulted? Not Applicable   Tyrell Antonio 09/24/17 1759

## 2017-09-24 NOTE — ED Triage Notes (Signed)
Pt c/o headache, thick green sptutum, and urinary frequency for over 2 weeks.

## 2017-09-26 ENCOUNTER — Telehealth: Payer: Self-pay

## 2017-09-26 LAB — URINE CULTURE
MICRO NUMBER:: 90609557
SPECIMEN QUALITY:: ADEQUATE

## 2017-09-26 NOTE — Telephone Encounter (Signed)
Left message on VM with lab results and to call if any questions or concerns

## 2017-11-13 ENCOUNTER — Other Ambulatory Visit: Payer: Self-pay | Admitting: Internal Medicine

## 2017-12-16 ENCOUNTER — Other Ambulatory Visit: Payer: Self-pay | Admitting: Internal Medicine

## 2017-12-18 NOTE — Telephone Encounter (Signed)
Done erx 

## 2017-12-21 ENCOUNTER — Emergency Department (INDEPENDENT_AMBULATORY_CARE_PROVIDER_SITE_OTHER)
Admission: EM | Admit: 2017-12-21 | Discharge: 2017-12-21 | Disposition: A | Discharge status: Home / Self Care | Payer: BLUE CROSS/BLUE SHIELD | Source: Home / Self Care | Attending: Family Medicine | Admitting: Family Medicine

## 2017-12-21 ENCOUNTER — Other Ambulatory Visit: Payer: Self-pay

## 2017-12-21 DIAGNOSIS — J019 Acute sinusitis, unspecified: Secondary | ICD-10-CM | POA: Diagnosis not present

## 2017-12-21 MED ORDER — AZITHROMYCIN 250 MG PO TABS: 250.0000 mg | ORAL_TABLET | Freq: Every day | ORAL | 0 refills | Status: DC

## 2017-12-21 NOTE — ED Provider Notes (Signed)
[name]    CSN: [id number] Arrival date & time: 12/21/17  1944     History   Chief Complaint Chief Complaint  Patient presents with  . Facial Pain  . Ear Fullness    HPI Caitlin Greene is a 52 y.o. female.   HPI Caitlin Greene is a 53 y.o. female presenting to UC with c/o 2 weeks of gradually worsening sinus congestion, pressure, sinus pain and head pressure. She has greenish nasal discharge and Left ear fullness. She has tried OTC medications w/o relief. Denies fever, chills, n/v/d. No known sick contacts. Hx of sinus infections in the past.    Past Medical History:  Diagnosis Date  . ALLERGIC RHINITIS   . ANEMIA-IRON DEFICIENCY   . ANXIETY   . BREAST CANCER, HX OF 01/21/2007   at 52yo  . DEPRESSION   . GERD   . HYPERLIPIDEMIA   . RENAL CALCULUS     Patient Active Problem List   Diagnosis Date Noted  . Deviated septum 01/13/2016  . Nasal turbinate hypertrophy 12/11/2015  . Rhinitis, chronic 12/11/2015  . Acute sinus infection 07/30/2014  . LLL pneumonia ([place]) 06/04/2014  . Chronic sinusitis 01/25/2012  . Vertigo 01/25/2012  . ETD (eustachian tube dysfunction) 10/01/2011  . Preventative health care 11/17/2010  . HEMATOCHEZIA 05/31/2010  . ANEMIA-IRON DEFICIENCY 10/11/2007  . GERD 10/11/2007  . HYPERLIPIDEMIA 01/21/2007  . Anxiety state 01/21/2007  . Depression 01/21/2007  . Allergic rhinitis 01/21/2007  . RENAL CALCULUS 01/21/2007  . BREAST CANCER, HX OF 01/21/2007    Past Surgical History:  Procedure Laterality Date  . BREAST ENHANCEMENT SURGERY    . MASTECTOMY     right  . TEMPOROMANDIBULAR JOINT SURGERY     Left  . TONSILLECTOMY      OB History   None      Home Medications    Prior to Admission medications   Medication Sig Start Date End Date Taking? Authorizing Provider  azithromycin (ZITHROMAX) 250 MG tablet Take 1 tablet (250 mg total) by mouth daily. Take first 2 tablets together, then 1 every day until finished.  12/21/17   Noe Gens, PA-C  benzonatate (TESSALON) 100 MG capsule Take 1-2 capsules (100-200 mg total) by mouth every 8 (eight) hours. 06/16/17   Noe Gens, PA-C  cetirizine (ZYRTEC) 10 MG tablet Take 10 mg by mouth daily.    [provider]  doxycycline (VIBRAMYCIN) 100 MG capsule Take 1 capsule (100 mg total) by mouth 2 (two) times daily. One po bid x 7 days 09/24/17   Noe Gens, PA-C  FLUoxetine (PROZAC) 20 MG capsule TAKE 1 CAPSULE BY MOUTH EVERY DAY.NEED APPT FOR REFILLS 12/18/17   Biagio Borg, MD  fluticasone [facility]) 50 MCG/ACT nasal spray Place 2 sprays into both nostrils daily. 06/16/17   Noe Gens, PA-C  Meclizine HCl 25 MG CHEW Chew 25 mg by mouth 3 (three) times daily as needed.    [provider]  naproxen sodium (ANAPROX) 220 MG tablet Take 220 mg by mouth 2 (two) times daily as needed. For pain    [provider]  sulfamethoxazole-trimethoprim (BACTRIM DS,SEPTRA DS) 800-160 MG tablet Take 1 tablet by mouth 2 (two) times daily. 09/21/16   Biagio Borg, MD  triamcinolone (NASACORT) 55 MCG/ACT AERO nasal inhaler Place 2 sprays into the nose daily. 09/21/16   Biagio Borg, MD    Family History Family History  Problem Relation Age of Onset  . Colon  polyps Mother   . Cancer Mother        Uterine Cancer  . Hypertension Mother   . Dementia Mother   . Colon polyps Father   . COPD Father        smoked  . Hypertension Other   . Diabetes Other   . Asthma Sister     Social History Social History   Tobacco Use  . Smoking status: Never Smoker  . Smokeless tobacco: Never Used  Substance Use Topics  . Alcohol use: No    Alcohol/week: 0.0 standard drinks  . Drug use: No     Allergies   Amoxicillin-pot clavulanate and Codeine   Review of Systems Review of Systems  Constitutional: Negative for chills and fever.  HENT: Positive for congestion, ear pain (Left), sinus pressure and sinus pain. Negative for sore throat, trouble  swallowing and voice change.   Respiratory: Negative for cough and shortness of breath.   Cardiovascular: Negative for chest pain and palpitations.  Gastrointestinal: Negative for abdominal pain, diarrhea, nausea and vomiting.  Musculoskeletal: Negative for arthralgias, back pain and myalgias.  Skin: Negative for rash.  Neurological: Positive for headaches.     Physical Exam Triage Vital Signs ED Triage Vitals  Enc Vitals Group     BP 12/21/17 1957 119/83     Pulse Rate 12/21/17 1957 75     Resp --      Temp 12/21/17 1957 97.7 F (36.5 C)     Temp Source 12/21/17 1957 Oral     SpO2 12/21/17 1957 98 %     Weight 12/21/17 1959 206 lb (93.4 kg)     Height 12/21/17 1959 5\' 8"  (1.727 m)     Head Circumference --      Peak Flow --      Pain Score 12/21/17 1959 7     Pain Loc --      Pain Edu? --      Excl. in [place]? --    No data found.  Updated Vital Signs BP 119/83 (BP Location: Right Arm)   Pulse 75   Temp 97.7 F (36.5 C) (Oral)   Ht 5\' 8"  (1.727 m)   Wt 206 lb (93.4 kg)   LMP 06/09/2017 (Exact Date)   SpO2 98%   BMI 31.32 kg/m   Visual Acuity Right Eye Distance:   Left Eye Distance:   Bilateral Distance:    Right Eye Near:   Left Eye Near:    Bilateral Near:     Physical Exam  Constitutional: She is oriented to person, place, and time. She appears well-developed and well-nourished. No distress.  HENT:  Head: Normocephalic and atraumatic.  Right Ear: Tympanic membrane normal.  Left Ear: Tympanic membrane normal.  Nose: Mucosal edema present. Right sinus exhibits maxillary sinus tenderness and frontal sinus tenderness. Left sinus exhibits maxillary sinus tenderness and frontal sinus tenderness.  Mouth/Throat: Uvula is midline, oropharynx is clear and moist and mucous membranes are normal.  Eyes: EOM are normal.  Neck: Normal range of motion. Neck supple.  Cardiovascular: Normal rate and regular rhythm.  Pulmonary/Chest: Effort normal and breath sounds normal.  No stridor. No respiratory distress. She has no wheezes. She has no rales.  Musculoskeletal: Normal range of motion.  Lymphadenopathy:    She has no cervical adenopathy.  Neurological: She is alert and oriented to person, place, and time.  Skin: Skin is warm and dry. She is not diaphoretic.  Psychiatric: She has a normal mood and  affect. Her behavior is normal.  Nursing note and vitals reviewed.    UC Treatments / Results  Labs (all labs ordered are listed, but only abnormal results are displayed) Labs Reviewed - No data to display  EKG None  Radiology No results found.  Procedures Procedures (including critical care time)  Medications Ordered in UC Medications - No data to display  Initial Impression / Assessment and Plan / UC Course  I have reviewed the triage vital signs and the nursing notes.  Pertinent labs & imaging results that were available during my care of the patient were reviewed by me and considered in my medical decision making (see chart for details).     Hx and exam c/w sinusitis Will tx with azithromycin, pt allergic to Augmentin.  Final Clinical Impressions(s) / UC Diagnoses   Final diagnoses:  Acute rhinosinusitis     Discharge Instructions      Please follow up with family medicine in 1 week if not improving.     ED Prescriptions    Medication Sig Dispense Auth. Provider   azithromycin (ZITHROMAX) 250 MG tablet Take 1 tablet (250 mg total) by mouth daily. Take first 2 tablets together, then 1 every day until finished. 6 tablet Noe Gens, PA-C     Controlled Substance Prescriptions [place] Controlled Substance Registry consulted? Not Applicable   Tyrell Antonio 12/22/17 1023

## 2017-12-21 NOTE — Discharge Instructions (Signed)
°  Please follow up with family medicine in 1 week if not improving. °

## 2017-12-21 NOTE — ED Triage Notes (Signed)
Pt has had pressure headaches, left ear fullness, and greenish drainage from nose for about 2 weeks.

## 2017-12-27 ENCOUNTER — Encounter: Payer: BLUE CROSS/BLUE SHIELD | Admitting: Internal Medicine

## 2018-03-22 ENCOUNTER — Other Ambulatory Visit: Payer: Self-pay | Admitting: Internal Medicine

## 2018-03-22 MED ORDER — FLUOXETINE HCL 20 MG PO CAPS: ORAL_CAPSULE | ORAL | 0 refills | Status: DC

## 2018-03-22 NOTE — Telephone Encounter (Signed)
21 day courtesy refill Requested Prescriptions  Pending Prescriptions Disp Refills  . FLUoxetine (PROZAC) 20 MG capsule 21 capsule 0    Sig: TAKE 1 CAPSULE BY MOUTH EVERY DAY.NEED APPT FOR REFILLS     Psychiatry:  Antidepressants - SSRI Failed - 03/22/2018 10:13 AM      Failed - Completed PHQ-2 or PHQ-9 in the last 360 days.      Failed - Valid encounter within last 6 months    Recent Outpatient Visits          12 months ago Acute sinusitis, recurrence not specified, unspecified location   [facility] -Georges Mouse, MD   1 year ago Chronic sinusitis, unspecified location   [place], James W, MD   1 year ago Acute sinusitis, recurrence not specified, unspecified location   [place], James W, MD   2 years ago Sinus congestion   [place], FNP   2 years ago ETD (eustachian tube dysfunction), unspecified laterality   [place], [place], RN      Future Appointments            In 3 weeks Jenny Reichmann, Hunt Oris, MD [place], [facility]

## 2018-03-22 NOTE — Telephone Encounter (Signed)
Copied from [place] [id number]. Topic: Quick Communication - Rx Refill/Question >> Mar 22, 2018  9:58 AM Rutherford Nail, NT wrote: **Patient has appointment schedule for 04/12/18 for med refills**  Medication: FLUoxetine (PROZAC) 20 MG capsule  Has the patient contacted their pharmacy? Yes.  To call the office (Agent: If no, request that the patient contact the pharmacy for the refill.) (Agent: If yes, when and what did the pharmacy advise?)  Preferred Pharmacy (with phone number or street name): [id number] - [place], [place] [address]  Agent: Please be advised that RX refills may take up to 3 business days. We ask that you follow-up with your pharmacy.

## 2018-03-24 ENCOUNTER — Other Ambulatory Visit: Payer: Self-pay | Admitting: Internal Medicine

## 2018-04-12 ENCOUNTER — Ambulatory Visit: Payer: BLUE CROSS/BLUE SHIELD | Admitting: Internal Medicine

## 2018-04-12 ENCOUNTER — Other Ambulatory Visit (INDEPENDENT_AMBULATORY_CARE_PROVIDER_SITE_OTHER): Payer: BLUE CROSS/BLUE SHIELD

## 2018-04-12 ENCOUNTER — Encounter: Payer: Self-pay | Admitting: Internal Medicine

## 2018-04-12 VITALS — BP 118/78 | HR 97 | Temp 98.0°F | Ht 68.0 in | Wt 206.0 lb

## 2018-04-12 DIAGNOSIS — Z1159 Encounter for screening for other viral diseases: Secondary | ICD-10-CM | POA: Diagnosis not present

## 2018-04-12 DIAGNOSIS — Z0001 Encounter for general adult medical examination with abnormal findings: Secondary | ICD-10-CM

## 2018-04-12 DIAGNOSIS — J309 Allergic rhinitis, unspecified: Secondary | ICD-10-CM

## 2018-04-12 DIAGNOSIS — Z114 Encounter for screening for human immunodeficiency virus [HIV]: Secondary | ICD-10-CM

## 2018-04-12 DIAGNOSIS — J019 Acute sinusitis, unspecified: Secondary | ICD-10-CM

## 2018-04-12 DIAGNOSIS — F411 Generalized anxiety disorder: Secondary | ICD-10-CM

## 2018-04-12 LAB — BASIC METABOLIC PANEL
BUN: 18 mg/dL (ref 6–23)
CO2: 32 mEq/L (ref 19–32)
Calcium: 9.7 mg/dL (ref 8.4–10.5)
Chloride: 105 mEq/L (ref 96–112)
Creatinine, Ser: 0.97 mg/dL (ref 0.40–1.20)
GFR: 63.55 mL/min (ref >60.00)
Glucose, Bld: 86 mg/dL (ref 70–99)
Potassium: 3.6 mEq/L (ref 3.5–5.1)
Sodium: 144 mEq/L (ref 135–145)

## 2018-04-12 LAB — TSH: TSH: 2.77 u[IU]/mL (ref 0.35–4.50)

## 2018-04-12 LAB — CBC WITH DIFFERENTIAL/PLATELET
Basophils Absolute: 0.1 10*3/uL (ref 0.0–0.1)
Basophils Relative: 1.3 % (ref 0.0–3.0)
Eosinophils Absolute: 0.1 10*3/uL (ref 0.0–0.7)
Eosinophils Relative: 1.8 % (ref 0.0–5.0)
HCT: 41.4 % (ref 36.0–46.0)
Hemoglobin: 14 g/dL (ref 12.0–15.0)
Lymphocytes Relative: 27.7 % (ref 12.0–46.0)
Lymphs Abs: 1.7 10*3/uL (ref 0.7–4.0)
MCHC: 33.8 g/dL (ref 30.0–36.0)
MCV: 88.5 fl (ref 78.0–100.0)
Monocytes Absolute: 0.6 10*3/uL (ref 0.1–1.0)
Monocytes Relative: 10.6 % (ref 3.0–12.0)
Neutro Abs: 3.5 10*3/uL (ref 1.4–7.7)
Neutrophils Relative %: 58.6 % (ref 43.0–77.0)
Platelets: 221 10*3/uL (ref 150.0–400.0)
RBC: 4.68 Mil/uL (ref 3.87–5.11)
RDW: 15.3 % (ref 11.5–15.5)
WBC: 6 10*3/uL (ref 4.0–10.5)

## 2018-04-12 LAB — URINALYSIS, ROUTINE W REFLEX MICROSCOPIC
Bilirubin Urine: NEGATIVE
Hgb urine dipstick: NEGATIVE
Ketones, ur: NEGATIVE
Leukocytes, UA: NEGATIVE
Nitrite: NEGATIVE
RBC / HPF: NONE SEEN (ref 0–?)
Specific Gravity, Urine: 1.025 (ref 1.000–1.030)
Total Protein, Urine: NEGATIVE
Urine Glucose: NEGATIVE
Urobilinogen, UA: 0.2 (ref 0.0–1.0)
pH: 6.5 (ref 5.0–8.0)

## 2018-04-12 LAB — HEPATIC FUNCTION PANEL
ALT: 19 U/L (ref 0–35)
AST: 11 U/L (ref 0–37)
Albumin: 4.3 g/dL (ref 3.5–5.2)
Alkaline Phosphatase: 61 U/L (ref 39–117)
Bilirubin, Direct: 0.1 mg/dL (ref 0.0–0.3)
Total Bilirubin: 0.4 mg/dL (ref 0.2–1.2)
Total Protein: 7.2 g/dL (ref 6.0–8.3)

## 2018-04-12 LAB — LIPID PANEL
Cholesterol: 212 mg/dL — ABNORMAL HIGH (ref 0–200)
HDL: 52 mg/dL (ref >39.00)
NonHDL: 159.62
Total CHOL/HDL Ratio: 4
Triglycerides: 229 mg/dL — ABNORMAL HIGH (ref 0.0–149.0)
VLDL: 45.8 mg/dL — ABNORMAL HIGH (ref 0.0–40.0)

## 2018-04-12 LAB — LDL CHOLESTEROL, DIRECT: Direct LDL: 141 mg/dL

## 2018-04-12 MED ORDER — FLUOXETINE HCL 20 MG PO CAPS: ORAL_CAPSULE | ORAL | 3 refills | Status: DC

## 2018-04-12 MED ORDER — LEVOFLOXACIN 500 MG PO TABS: 500.0000 mg | ORAL_TABLET | Freq: Every day | ORAL | 0 refills | Status: AC

## 2018-04-12 NOTE — Assessment & Plan Note (Signed)
Mild to mod, for antibx course,  to f/u any worsening symptoms or concerns  In addition to the time spent performing CPE, I spent an additional 25 minutes face to face,in which greater than 50% of this time was spent in counseling and coordination of care for patient's acute illness as documented, including the differential dx, treatment, further evaluation and other management of acute sinus infection, allergic rhinitis, and anxiety

## 2018-04-12 NOTE — Assessment & Plan Note (Signed)
Mild to mod, for zyrtec prn,  to f/u any worsening symptoms or concerns 

## 2018-04-12 NOTE — Assessment & Plan Note (Signed)
stable overall by history and exam, recent data reviewed with pt, and pt to continue medical treatment as before,  to f/u any worsening symptoms or concerns, for prozac refill 

## 2018-04-12 NOTE — Patient Instructions (Addendum)
Please take all new medication as prescribed - the antibiotic  Please continue all other medications as before, and refills have been done if requested - the prozac  OK to take the zyrtec otc as needed  Please have the pharmacy call with any other refills you may need.  Please continue your efforts at being more active, low cholesterol diet, and weight control.  You are otherwise up to date with prevention measures today.  Please keep your appointments with your specialists as you may have planned  You will be contacted regarding the referral for: colonoscopy for mid 2020 as you requested  Please go to the LAB in the Basement (turn left off the elevator) for the tests to be done today  You will be contacted by phone if any changes need to be made immediately.  Otherwise, you will receive a letter about your results with an explanation, but please check with MyChart first  Please remember to sign up for MyChart if you have not done so, as this will be important to you in the future with finding out test results, communicating by private email, and scheduling acute appointments online when needed.  Please return in 1 year for your yearly visit, or sooner if needed, with Lab testing done 3-5 days before

## 2018-04-12 NOTE — Progress Notes (Signed)
Subjective:    Patient ID: Caitlin Greene, female    DOB: Jun 29, 1965, 52 y.o.   MRN: [id number]  HPI  Here for wellness and f/u;  Overall doing ok;  Pt denies Chest pain, worsening SOB, DOE, wheezing, orthopnea, PND, worsening LE edema, palpitations, dizziness or syncope.  Pt denies neurological change such as new headache, facial or extremity weakness.  Pt denies polydipsia, polyuria, or low sugar symptoms. Pt states overall good compliance with treatment and medications, good tolerability, and has been trying to follow appropriate diet.  Pt denies worsening depressive symptoms, suicidal ideation or panic. No fever, night sweats, wt loss, loss of appetite, or other constitutional symptoms.  Pt states good ability with ADL's, has low fall risk, home safety reviewed and adequate, no other significant changes in hearing or vision, and only occasionally active with exercise. Also,  Here with 2-3 days acute onset fever, facial pain, pressure, headache, general weakness and malaise, and greenish d/c, with mild ST and cough.  Does have several wks ongoing nasal allergy symptoms with clearish congestion, itch and sneezing, without fever, pain, ST, cough, swelling or wheezing. Denies worsening depressive symptoms, suicidal ideation, or panic; has ongoing anxiety, not increased recently, asks for prozac refill.   Past Medical History:  Diagnosis Date  . ALLERGIC RHINITIS   . ANEMIA-IRON DEFICIENCY   . ANXIETY   . BREAST CANCER, HX OF 01/21/2007   at 52yo  . DEPRESSION   . GERD   . HYPERLIPIDEMIA   . RENAL CALCULUS    Past Surgical History:  Procedure Laterality Date  . BREAST ENHANCEMENT SURGERY    . MASTECTOMY     right  . TEMPOROMANDIBULAR JOINT SURGERY     Left  . TONSILLECTOMY      reports that she has never smoked. She has never used smokeless tobacco. She reports that she does not drink alcohol or use drugs. family history includes Asthma in her sister; COPD in her father; Cancer in her  mother; Colon polyps in her father and mother; Dementia in her mother; Diabetes in her other; Hypertension in her mother and other. Allergies  Allergen Reactions  . Amoxicillin-Pot Clavulanate Hives  . Codeine Nausea And Vomiting    Needs pre-meds Needs pre-meds   Current Outpatient Medications on File Prior to Visit  Medication Sig Dispense Refill  . benzonatate (TESSALON) 100 MG capsule Take 1-2 capsules (100-200 mg total) by mouth every 8 (eight) hours. 21 capsule 0  . cetirizine (ZYRTEC) 10 MG tablet Take 10 mg by mouth daily.    Marland Kitchen doxycycline (VIBRAMYCIN) 100 MG capsule Take 1 capsule (100 mg total) by mouth 2 (two) times daily. One po bid x 7 days 14 capsule 0  . fluticasone (FLONASE) 50 MCG/ACT nasal spray Place 2 sprays into both nostrils daily. 16 g 2  . Meclizine HCl 25 MG CHEW Chew 25 mg by mouth 3 (three) times daily as needed.    . naproxen sodium (ANAPROX) 220 MG tablet Take 220 mg by mouth 2 (two) times daily as needed. For pain    . triamcinolone (NASACORT) 55 MCG/ACT AERO nasal inhaler Place 2 sprays into the nose daily. 1 Inhaler 12   No current facility-administered medications on file prior to visit.    Review of Systems  Constitutional: Negative for other unusual diaphoresis or sweats HENT: Negative for ear discharge or swelling Eyes: Negative for other worsening visual disturbances Respiratory: Negative for stridor or other swelling  Gastrointestinal: Negative for worsening distension or other  blood Genitourinary: Negative for retention or other urinary change Musculoskeletal: Negative for other MSK pain or swelling Skin: Negative for color change or other new lesions Neurological: Negative for worsening tremors and other numbness  Psychiatric/Behavioral: Negative for worsening agitation or other fatigue All other system neg per pt    Objective:   Physical Exam BP 118/78   Pulse 97   Temp 98 F (36.7 C) (Oral)   Ht 5\' 8"  (1.727 m)   Wt 206 lb (93.4 kg)    LMP 06/09/2017 (Exact Date)   SpO2 96%   BMI 31.32 kg/m  VS noted, mild ill Constitutional: Pt is oriented to person, place, and time. Appears well-developed and well-nourished, in no significant distress and comfortable Head: Normocephalic and atraumatic  Eyes: Conjunctivae and EOM are normal. Pupils are equal, round, and reactive to light Bilat tm's with mild erythema.  Max sinus areas mild tender.  Pharynx with mild erythema, no exudate Right Ear: External ear normal without discharge Left Ear: External ear normal without discharge Nose: Nose without discharge or deformity Mouth/Throat: Oropharynx is without other ulcerations and moist  Neck: Normal range of motion. Neck supple. No JVD present. No tracheal deviation present or significant neck LA or mass Cardiovascular: Normal rate, regular rhythm, normal heart sounds and intact distal pulses.   Pulmonary/Chest: WOB normal and breath sounds without rales or wheezing  Abdominal: Soft. Bowel sounds are normal. NT. No HSM  Musculoskeletal: Normal range of motion. Exhibits no edema Lymphadenopathy: Has no other cervical adenopathy.  Neurological: Pt is alert and oriented to person, place, and time. Pt has normal reflexes. No cranial nerve deficit. Motor grossly intact, Gait intact Skin: Skin is warm and dry. No rash noted or new ulcerations Psychiatric:  Has normal mood and affect. Behavior is normal without agitation No other exam findings Lab Results  Component Value Date   WBC 6.0 04/12/2018   HGB 14.0 04/12/2018   HCT 41.4 04/12/2018   PLT 221.0 04/12/2018   GLUCOSE 86 04/12/2018   CHOL 212 (H) 04/12/2018   TRIG 229.0 (H) 04/12/2018   HDL 52.00 04/12/2018   LDLDIRECT 141.0 04/12/2018   LDLCALC 129 (H) 04/12/2013   ALT 19 04/12/2018   AST 11 04/12/2018   NA 144 04/12/2018   K 3.6 04/12/2018   CL 105 04/12/2018   CREATININE 0.97 04/12/2018   BUN 18 04/12/2018   CO2 32 04/12/2018   TSH 2.77 04/12/2018   HGBA1C 5.5  04/21/2014          Assessment & Plan:

## 2018-04-12 NOTE — Assessment & Plan Note (Signed)

## 2018-04-13 LAB — HEPATITIS C ANTIBODY
Hepatitis C Ab: NONREACTIVE
SIGNAL TO CUT-OFF: 0.02 (ref <1.00)

## 2018-04-13 LAB — HIV ANTIBODY (ROUTINE TESTING W REFLEX): HIV 1&2 Ab, 4th Generation: NONREACTIVE

## 2018-05-14 ENCOUNTER — Other Ambulatory Visit: Payer: Self-pay

## 2018-05-14 ENCOUNTER — Encounter: Payer: Self-pay | Admitting: Emergency Medicine

## 2018-05-14 ENCOUNTER — Emergency Department
Admission: EM | Admit: 2018-05-14 | Discharge: 2018-05-14 | Disposition: A | Discharge status: Home / Self Care | Payer: BLUE CROSS/BLUE SHIELD | Source: Home / Self Care | Attending: Emergency Medicine | Admitting: Emergency Medicine

## 2018-05-14 DIAGNOSIS — J029 Acute pharyngitis, unspecified: Secondary | ICD-10-CM

## 2018-05-14 DIAGNOSIS — J209 Acute bronchitis, unspecified: Secondary | ICD-10-CM

## 2018-05-14 LAB — POCT RAPID STREP A (OFFICE): Rapid Strep A Screen: NEGATIVE

## 2018-05-14 MED ORDER — DOXYCYCLINE HYCLATE 100 MG PO CAPS: 100.0000 mg | ORAL_CAPSULE | Freq: Two times a day (BID) | ORAL | 0 refills | Status: DC

## 2018-05-14 NOTE — Discharge Instructions (Signed)
See your Physician for recheck in 3-4 days if not improving

## 2018-05-14 NOTE — ED Triage Notes (Signed)
Sore throat, chills, cough, green mucus x 4 days

## 2018-05-14 NOTE — ED Provider Notes (Signed)
[name]    CSN: [id number] Arrival date & time: 05/14/18  1313     History   Chief Complaint Chief Complaint  Patient presents with  . Sore Throat    HPI Caitlin Greene is a 53 y.o. female.   The history is provided by the patient. No language interpreter was used.  Sore Throat  This is a new problem. Episode onset: 4 days. The problem occurs constantly. The problem has not changed since onset.Pertinent negatives include no shortness of breath. Nothing aggravates the symptoms. Nothing relieves the symptoms. She has tried nothing for the symptoms. The treatment provided no relief.  Pt complains of a cough productive of green phelgm   Past Medical History:  Diagnosis Date  . ALLERGIC RHINITIS   . ANEMIA-IRON DEFICIENCY   . ANXIETY   . BREAST CANCER, HX OF 01/21/2007   at 53yo  . DEPRESSION   . GERD   . HYPERLIPIDEMIA   . RENAL CALCULUS     Patient Active Problem List   Diagnosis Date Noted  . Encounter for well adult exam with abnormal findings 04/12/2018  . Deviated septum 01/13/2016  . Nasal turbinate hypertrophy 12/11/2015  . Rhinitis, chronic 12/11/2015  . Acute sinus infection 07/30/2014  . LLL pneumonia ([place]) 06/04/2014  . Chronic sinusitis 01/25/2012  . Vertigo 01/25/2012  . ETD (eustachian tube dysfunction) 10/01/2011  . HEMATOCHEZIA 05/31/2010  . ANEMIA-IRON DEFICIENCY 10/11/2007  . GERD 10/11/2007  . HYPERLIPIDEMIA 01/21/2007  . Anxiety state 01/21/2007  . Depression 01/21/2007  . Allergic rhinitis 01/21/2007  . RENAL CALCULUS 01/21/2007  . BREAST CANCER, HX OF 01/21/2007    Past Surgical History:  Procedure Laterality Date  . BREAST ENHANCEMENT SURGERY    . MASTECTOMY     right  . TEMPOROMANDIBULAR JOINT SURGERY     Left  . TONSILLECTOMY      OB History   No obstetric history on file.      Home Medications    Prior to Admission medications   Medication Sig Start Date End Date Taking? Authorizing Provider  cetirizine  (ZYRTEC) 10 MG tablet Take 10 mg by mouth daily.    [provider]  doxycycline (VIBRAMYCIN) 100 MG capsule Take 1 capsule (100 mg total) by mouth 2 (two) times daily. 05/14/18   Fransico Meadow, PA-C  FLUoxetine (PROZAC) 20 MG capsule TAKE 1 CAPSULE BY MOUTH EVERY DAY.NEED APPT FOR REFILLS 04/12/18   Biagio Borg, MD  fluticasone [facility]) 50 MCG/ACT nasal spray Place 2 sprays into both nostrils daily. 06/16/17   Noe Gens, PA-C  naproxen sodium (ANAPROX) 220 MG tablet Take 220 mg by mouth 2 (two) times daily as needed. For pain    [provider]  triamcinolone (NASACORT) 55 MCG/ACT AERO nasal inhaler Place 2 sprays into the nose daily. 09/21/16   Biagio Borg, MD    Family History Family History  Problem Relation Age of Onset  . Colon polyps Mother   . Cancer Mother        Uterine Cancer  . Hypertension Mother   . Dementia Mother   . Colon polyps Father   . COPD Father        smoked  . Hypertension Other   . Diabetes Other   . Asthma Sister     Social History Social History   Tobacco Use  . Smoking status: Never Smoker  . Smokeless tobacco: Never Used  Substance Use Topics  . Alcohol use: No  Alcohol/week: 0.0 standard drinks  . Drug use: No     Allergies   Amoxicillin-pot clavulanate and Codeine   Review of Systems Review of Systems  HENT: Positive for sore throat.   Respiratory: Positive for cough. Negative for shortness of breath.   All other systems reviewed and are negative.    Physical Exam Triage Vital Signs ED Triage Vitals [05/14/18 1334]  Enc Vitals Group     BP 114/78     Pulse Rate 94     Resp      Temp 97.7 F (36.5 C)     Temp Source Oral     SpO2 97 %     Weight 206 lb (93.4 kg)     Height 5\' 8"  (1.727 m)     Head Circumference      Peak Flow      Pain Score 4     Pain Loc      Pain Edu?      Excl. in [place]?    No data found.  Updated Vital Signs BP 114/78 (BP Location: Right Arm)   Pulse 94   Temp 97.7 F  (36.5 C) (Oral)   Ht 5\' 8"  (1.727 m)   Wt 206 lb (93.4 kg)   LMP 06/09/2017 (Exact Date)   SpO2 97%   BMI 31.32 kg/m   Visual Acuity Right Eye Distance:   Left Eye Distance:   Bilateral Distance:    Right Eye Near:   Left Eye Near:    Bilateral Near:     Physical Exam Vitals signs and nursing note reviewed.  Constitutional:      Appearance: She is well-developed.  HENT:     Head: Normocephalic.     Right Ear: Tympanic membrane normal.     Left Ear: Tympanic membrane normal.     Mouth/Throat:     Mouth: Mucous membranes are moist.  Eyes:     Conjunctiva/sclera: Conjunctivae normal.  Neck:     Musculoskeletal: Normal range of motion.  Cardiovascular:     Rate and Rhythm: Normal rate.  Pulmonary:     Effort: Pulmonary effort is normal.  Abdominal:     General: There is no distension.  Musculoskeletal: Normal range of motion.  Skin:    General: Skin is warm.  Neurological:     General: No focal deficit present.     Mental Status: She is alert and oriented to person, place, and time.  Psychiatric:        Mood and Affect: Mood normal.      UC Treatments / Results  Labs (all labs ordered are listed, but only abnormal results are displayed) Labs Reviewed  POCT RAPID STREP A (OFFICE)    EKG None  Radiology No results found.  Procedures Procedures (including critical care time)  Medications Ordered in UC Medications - No data to display  Initial Impression / Assessment and Plan / UC Course  I have reviewed the triage vital signs and the nursing notes.  Pertinent labs & imaging results that were available during my care of the patient were reviewed by me and considered in my medical decision making (see chart for details).     MDM  Strep is negative,  I will cover with Doxycycline for bronchits  Final Clinical Impressions(s) / UC Diagnoses   Final diagnoses:  Acute pharyngitis, unspecified etiology  Acute bronchitis, unspecified organism      Discharge Instructions     See your Physician for recheck in  3-4 days if not improving   ED Prescriptions    Medication Sig Dispense Auth. Provider   doxycycline (VIBRAMYCIN) 100 MG capsule Take 1 capsule (100 mg total) by mouth 2 (two) times daily. 20 capsule Fransico Meadow, [place]     Controlled Substance Prescriptions [place] Controlled Substance Registry consulted? Not Applicable   Fransico Meadow, [place] 05/14/18 1415

## 2018-08-15 ENCOUNTER — Encounter: Payer: Self-pay | Admitting: Internal Medicine

## 2018-08-15 ENCOUNTER — Ambulatory Visit (INDEPENDENT_AMBULATORY_CARE_PROVIDER_SITE_OTHER): Payer: BLUE CROSS/BLUE SHIELD | Admitting: Internal Medicine

## 2018-08-15 DIAGNOSIS — J309 Allergic rhinitis, unspecified: Secondary | ICD-10-CM | POA: Diagnosis not present

## 2018-08-15 DIAGNOSIS — F32A Depression, unspecified: Secondary | ICD-10-CM

## 2018-08-15 DIAGNOSIS — J329 Chronic sinusitis, unspecified: Secondary | ICD-10-CM | POA: Diagnosis not present

## 2018-08-15 DIAGNOSIS — F329 Major depressive disorder, single episode, unspecified: Secondary | ICD-10-CM | POA: Diagnosis not present

## 2018-08-15 MED ORDER — FLUOXETINE HCL 20 MG PO CAPS: ORAL_CAPSULE | ORAL | 3 refills | Status: DC

## 2018-08-15 MED ORDER — DOXYCYCLINE HYCLATE 100 MG PO CAPS
100.0000 mg | ORAL_CAPSULE | Freq: Two times a day (BID) | ORAL | 0 refills | Status: DC
Start: 2018-08-15 — End: 2019-06-03

## 2018-08-15 NOTE — Assessment & Plan Note (Signed)
stable overall by history and exam, recent data reviewed with pt, and pt to continue medical treatment as before,  to f/u any worsening symptoms or concerns  

## 2018-08-15 NOTE — Progress Notes (Signed)
Patient ID: Caitlin Greene, female   DOB: Dec 09, 1965, 53 y.o.   MRN: [id number]  Virtual Visit via Video Note  I connected with [name] on 08/15/18 at 11:00 AM EDT by a video enabled telemedicine application and verified that I am speaking with the correct person using two identifiers. I am at the office, pt is at home and no other persons present   I discussed the limitations of evaluation and management by telemedicine and the availability of in person appointments. The patient expressed understanding and agreed to proceed.  History of Present Illness:  Here with 2-3 days acute onset fever, facial pain, pressure, headache, general weakness and malaise, and greenish d/c, with mild ST and cough, but pt denies chest pain, wheezing, increased sob or doe, orthopnea, PND, increased LE swelling, palpitations, dizziness or syncope. Has hx of recurrent sinusitis in past, last antibx jan 2020.  No diarrhea and Denies worsening reflux, abd pain, dysphagia, n/v, bowel change or blood.  Does have several wks ongoing nasal allergy symptoms with clearish congestion, itch and sneezing, without fever, pain, ST, cough, swelling or wheezing, but plans to restart the nasacort as well.  Denies worsening depressive symptoms, suicidal ideation, or panic; but needs prozac refill Past Medical History:  Diagnosis Date  . ALLERGIC RHINITIS   . ANEMIA-IRON DEFICIENCY   . ANXIETY   . BREAST CANCER, HX OF 01/21/2007   at 53yo  . DEPRESSION   . GERD   . HYPERLIPIDEMIA   . RENAL CALCULUS    Past Surgical History:  Procedure Laterality Date  . BREAST ENHANCEMENT SURGERY    . MASTECTOMY     right  . TEMPOROMANDIBULAR JOINT SURGERY     Left  . TONSILLECTOMY      reports that she has never smoked. She has never used smokeless tobacco. She reports that she does not drink alcohol or use drugs. family history includes Asthma in her sister; COPD in her father; Cancer in her mother; Colon polyps in her father and  mother; Dementia in her mother; Diabetes in an other family member; Hypertension in her mother and another family member. Allergies  Allergen Reactions  . Amoxicillin-Pot Clavulanate Hives  . Codeine Nausea And Vomiting    Needs pre-meds Needs pre-meds   Current Outpatient Medications on File Prior to Visit  Medication Sig Dispense Refill  . cetirizine (ZYRTEC) 10 MG tablet Take 10 mg by mouth daily.    . naproxen sodium (ANAPROX) 220 MG tablet Take 220 mg by mouth 2 (two) times daily as needed. For pain    . triamcinolone (NASACORT) 55 MCG/ACT AERO nasal inhaler Place 2 sprays into the nose daily. 1 Inhaler 12   No current facility-administered medications on file prior to visit.     Observations/Objective: Mild ill, alert, fatigued, facial puffiness to maxillary areas noted, somewhat flushed, resps normal Lab Results  Component Value Date   WBC 6.0 04/12/2018   HGB 14.0 04/12/2018   HCT 41.4 04/12/2018   PLT 221.0 04/12/2018   GLUCOSE 86 04/12/2018   CHOL 212 (H) 04/12/2018   TRIG 229.0 (H) 04/12/2018   HDL 52.00 04/12/2018   LDLDIRECT 141.0 04/12/2018   LDLCALC 129 (H) 04/12/2013   ALT 19 04/12/2018   AST 11 04/12/2018   NA 144 04/12/2018   K 3.6 04/12/2018   CL 105 04/12/2018   CREATININE 0.97 04/12/2018   BUN 18 04/12/2018   CO2 32 04/12/2018   TSH 2.77 04/12/2018   HGBA1C 5.5 04/21/2014  Assessment and Plan: See notes  Follow Up Instructions: See notes   I discussed the assessment and treatment plan with the patient. The patient was provided an opportunity to ask questions and all were answered. The patient agreed with the plan and demonstrated an understanding of the instructions.   The patient was advised to call back or seek an in-person evaluation if the symptoms worsen or if the condition fails to improve as anticipated.   Cathlean Cower, MD

## 2018-08-15 NOTE — Assessment & Plan Note (Signed)
To restart the flonase as directed, declines other change of tx,  to f/u any worsening symptoms or concerns

## 2018-08-15 NOTE — Patient Instructions (Signed)
Please take all new medication as prescribed - the antibiotic  Please continue all other medications as before, and refills have been done if requested.  Please have the pharmacy call with any other refills you may need.  Please continue your efforts at being more active, low cholesterol diet, and weight control.  Please keep your appointments with your specialists as you may have planned    

## 2018-08-15 NOTE — Assessment & Plan Note (Signed)
With acute flare, for antibx, restart nasacort,  to f/u any worsening symptoms or concerns

## 2018-08-23 ENCOUNTER — Telehealth: Payer: Self-pay | Admitting: Internal Medicine

## 2018-08-23 MED ORDER — MECLIZINE HCL 12.5 MG PO TABS: 12.5000 mg | ORAL_TABLET | Freq: Three times a day (TID) | ORAL | 1 refills | Status: AC | PRN

## 2018-08-23 NOTE — Telephone Encounter (Signed)
[place] for trial meclizine prn - done erx

## 2018-08-23 NOTE — Telephone Encounter (Signed)
Left detailed message informing pt rx sent.

## 2018-08-23 NOTE — Telephone Encounter (Addendum)
Copied from [place] [phone]. Topic: Quick Communication - See Telephone Encounter >> Aug 23, 2018  1:00 PM Ivar Drape wrote: CRM for notification. See Telephone encounter for: 08/23/18. Patient is experiencing vertigo now and she would like to know what to take for it.  Please call cell phone. [phone]

## 2019-01-10 ENCOUNTER — Encounter: Payer: Self-pay | Admitting: Internal Medicine

## 2019-01-10 ENCOUNTER — Ambulatory Visit (INDEPENDENT_AMBULATORY_CARE_PROVIDER_SITE_OTHER): Payer: BC Managed Care – PPO | Admitting: Internal Medicine

## 2019-01-10 DIAGNOSIS — J019 Acute sinusitis, unspecified: Secondary | ICD-10-CM | POA: Diagnosis not present

## 2019-01-10 DIAGNOSIS — J309 Allergic rhinitis, unspecified: Secondary | ICD-10-CM

## 2019-01-10 DIAGNOSIS — Z0289 Encounter for other administrative examinations: Secondary | ICD-10-CM

## 2019-01-10 DIAGNOSIS — J342 Deviated nasal septum: Secondary | ICD-10-CM

## 2019-01-10 MED ORDER — SULFAMETHOXAZOLE-TRIMETHOPRIM 800-160 MG PO TABS: 1.0000 | ORAL_TABLET | Freq: Two times a day (BID) | ORAL | 0 refills | Status: DC

## 2019-01-10 NOTE — Progress Notes (Signed)
Patient ID: Caitlin Greene, female   DOB: 1965/11/25, 53 y.o.   MRN: [id number]  Virtual Visit via Video Note  I connected with [name] on 01/10/19 at  4:20 PM EDT by a video enabled telemedicine application and verified that I am speaking with the correct person using two identifiers.  Location: Patient: at home Provider: at office   I discussed the limitations of evaluation and management by telemedicine and the availability of in person appointments. The patient expressed understanding and agreed to proceed.  History of Present Illness:  Here with 2-3 days acute onset fever, facial pain, pressure, headache, general weakness and malaise, and greenish d/c, with mild ST and cough, but pt denies chest pain, wheezing, increased sob or doe, orthopnea, PND, increased LE swelling, palpitations, dizziness or syncope.  Does have several wks ongoing nasal allergy symptoms with clearish congestion, itch and sneezing, without fever, pain, ST, cough, swelling or wheezing.  Has seen ENT previously and no sinus surgury needed Past Medical History:  Diagnosis Date  . ALLERGIC RHINITIS   . ANEMIA-IRON DEFICIENCY   . ANXIETY   . BREAST CANCER, HX OF 01/21/2007   at 53yo  . DEPRESSION   . GERD   . HYPERLIPIDEMIA   . RENAL CALCULUS    Past Surgical History:  Procedure Laterality Date  . BREAST ENHANCEMENT SURGERY    . MASTECTOMY     right  . TEMPOROMANDIBULAR JOINT SURGERY     Left  . TONSILLECTOMY      reports that she has never smoked. She has never used smokeless tobacco. She reports that she does not drink alcohol or use drugs. family history includes Asthma in her sister; COPD in her father; Cancer in her mother; Colon polyps in her father and mother; Dementia in her mother; Diabetes in an other family member; Hypertension in her mother and another family member. Allergies  Allergen Reactions  . Amoxicillin-Pot Clavulanate Hives  . Codeine Nausea And Vomiting    Needs pre-meds Needs  pre-meds   Current Outpatient Medications on File Prior to Visit  Medication Sig Dispense Refill  . cetirizine (ZYRTEC) 10 MG tablet Take 10 mg by mouth daily.    Marland Kitchen doxycycline (VIBRAMYCIN) 100 MG capsule Take 1 capsule (100 mg total) by mouth 2 (two) times daily. 28 capsule 0  . FLUoxetine (PROZAC) 20 MG capsule TAKE 1 CAPSULE BY MOUTH EVERY DAY.NEED APPT FOR REFILLS 90 capsule 3  . meclizine (ANTIVERT) 12.5 MG tablet Take 1 tablet (12.5 mg total) by mouth 3 (three) times daily as needed for dizziness. 30 tablet 1  . naproxen sodium (ANAPROX) 220 MG tablet Take 220 mg by mouth 2 (two) times daily as needed. For pain    . triamcinolone (NASACORT) 55 MCG/ACT AERO nasal inhaler Place 2 sprays into the nose daily. 1 Inhaler 12   No current facility-administered medications on file prior to visit.     Observations/Objective: Alert, NAD, appropriate mood and affect, resps normal, cn 2-12 intact, moves all 4s, no visible rash or swelling Lab Results  Component Value Date   WBC 6.0 04/12/2018   HGB 14.0 04/12/2018   HCT 41.4 04/12/2018   PLT 221.0 04/12/2018   GLUCOSE 86 04/12/2018   CHOL 212 (H) 04/12/2018   TRIG 229.0 (H) 04/12/2018   HDL 52.00 04/12/2018   LDLDIRECT 141.0 04/12/2018   LDLCALC 129 (H) 04/12/2013   ALT 19 04/12/2018   AST 11 04/12/2018   NA 144 04/12/2018   K 3.6 04/12/2018  CL 105 04/12/2018   CREATININE 0.97 04/12/2018   BUN 18 04/12/2018   CO2 32 04/12/2018   TSH 2.77 04/12/2018   HGBA1C 5.5 04/21/2014   Assessment and Plan: See notes  Follow Up Instructions: Seen notes   I discussed the assessment and treatment plan with the patient. The patient was provided an opportunity to ask questions and all were answered. The patient agreed with the plan and demonstrated an understanding of the instructions.   The patient was advised to call back or seek an in-person evaluation if the symptoms worsen or if the condition fails to improve as anticipated.  Cathlean Cower, MD

## 2019-01-10 NOTE — Assessment & Plan Note (Signed)
Mild to mod, to retart nasacort asd,  to f/u any worsening symptoms or concerns

## 2019-01-10 NOTE — Assessment & Plan Note (Signed)
Mild to mod, for antibx course,  to f/u any worsening symptoms or concerns 

## 2019-01-10 NOTE — Assessment & Plan Note (Signed)
No further surgury needed

## 2019-01-10 NOTE — Patient Instructions (Signed)
Please take all new medication as prescribed  Please continue all other medications as before, and refills have been done if requested.  Please have the pharmacy call with any other refills you may need.  Please continue your efforts at being more active, low cholesterol diet, and weight control.  Please keep your appointments with your specialists as you may have planned    

## 2019-06-03 ENCOUNTER — Ambulatory Visit (INDEPENDENT_AMBULATORY_CARE_PROVIDER_SITE_OTHER): Payer: BC Managed Care – PPO | Admitting: Internal Medicine

## 2019-06-03 ENCOUNTER — Encounter: Payer: Self-pay | Admitting: Internal Medicine

## 2019-06-03 DIAGNOSIS — F329 Major depressive disorder, single episode, unspecified: Secondary | ICD-10-CM

## 2019-06-03 DIAGNOSIS — J329 Chronic sinusitis, unspecified: Secondary | ICD-10-CM | POA: Diagnosis not present

## 2019-06-03 DIAGNOSIS — F32A Depression, unspecified: Secondary | ICD-10-CM

## 2019-06-03 MED ORDER — SULFAMETHOXAZOLE-TRIMETHOPRIM 800-160 MG PO TABS: 1.0000 | ORAL_TABLET | Freq: Two times a day (BID) | ORAL | 0 refills | Status: DC

## 2019-06-03 NOTE — Assessment & Plan Note (Signed)
stable overall by history and exam, recent data reviewed with pt, and pt to continue medical treatment as before,  to f/u any worsening symptoms or concerns  

## 2019-06-03 NOTE — Assessment & Plan Note (Addendum)
With flare acute, Mild to mod, for antibx course,  to f/u any worsening symptoms or concerns  I spent 30 minutes preparing to see the patient by review of recent labs, imaging and procedures, obtaining and reviewing separately obtained history, communicating with the patient and family or caregiver, ordering medications, tests or procedures, and documenting clinical information in the EHR including the differential Dx, treatment, and any further evaluation and other management of sinusitis, allergies, depression

## 2019-06-03 NOTE — Patient Instructions (Signed)
Please take all new medication as prescribed 

## 2019-06-03 NOTE — Progress Notes (Signed)
Patient ID: Caitlin Greene, female   DOB: [birth year], 54 y.o.   MRN: [id number]  Virtual Visit via Video Note  I connected with Caitlin Greene on 06/03/19 at  1:40 PM EST by a video enabled telemedicine application and verified that I am speaking with the correct person using two identifiers.  Location: Patient: at home Provider: at office   I discussed the limitations of evaluation and management by telemedicine and the availability of in person appointments. The patient expressed understanding and agreed to proceed.  History of Present Illness:  Here with 2-3 days acute onset fever, facial pain, pressure, headache, general weakness and malaise, and greenish d/c, with mild ST and cough, but pt denies chest pain, wheezing, increased sob or doe, orthopnea, PND, increased LE swelling, palpitations, dizziness or syncope.  Pt denies new neurological symptoms such as new headache, or facial or extremity weakness or numbness   Pt denies polydipsia, polyuria   Pt denies fever, wt loss, night sweats, loss of appetite, or other constitutional symptoms.  Denies worsening depressive symptoms, suicidal ideation, or panic Past Medical History:  Diagnosis Date  . ALLERGIC RHINITIS   . ANEMIA-IRON DEFICIENCY   . ANXIETY   . BREAST CANCER, HX OF 01/21/2007   at 54yo  . DEPRESSION   . GERD   . HYPERLIPIDEMIA   . RENAL CALCULUS    Past Surgical History:  Procedure Laterality Date  . BREAST ENHANCEMENT SURGERY    . MASTECTOMY     right  . TEMPOROMANDIBULAR JOINT SURGERY     Left  . TONSILLECTOMY      reports that she has never smoked. She has never used smokeless tobacco. She reports that she does not drink alcohol or use drugs. family history includes Asthma in her sister; COPD in her father; Cancer in her mother; Colon polyps in her father and mother; Dementia in her mother; Diabetes in an other family member; Hypertension in her mother and another family member. Allergies  Allergen Reactions  .  Amoxicillin-Pot Clavulanate Hives  . Codeine Nausea And Vomiting    Needs pre-meds Needs pre-meds   Current Outpatient Medications on File Prior to Visit  Medication Sig Dispense Refill  . cetirizine (ZYRTEC) 10 MG tablet Take 10 mg by mouth daily.    Marland Kitchen FLUoxetine (PROZAC) 20 MG capsule TAKE 1 CAPSULE BY MOUTH EVERY DAY.NEED APPT FOR REFILLS 90 capsule 3  . meclizine (ANTIVERT) 12.5 MG tablet Take 1 tablet (12.5 mg total) by mouth 3 (three) times daily as needed for dizziness. 30 tablet 1  . naproxen sodium (ANAPROX) 220 MG tablet Take 220 mg by mouth 2 (two) times daily as needed. For pain    . triamcinolone (NASACORT) 55 MCG/ACT AERO nasal inhaler Place 2 sprays into the nose daily. 1 Inhaler 12   No current facility-administered medications on file prior to visit.    Observations/Objective: Alert, NAD, appropriate mood and affect, resps normal, cn 2-12 intact, moves all 4s, no visible rash or swelling Lab Results  Component Value Date   WBC 6.0 04/12/2018   HGB 14.0 04/12/2018   HCT 41.4 04/12/2018   PLT 221.0 04/12/2018   GLUCOSE 86 04/12/2018   CHOL 212 (H) 04/12/2018   TRIG 229.0 (H) 04/12/2018   HDL 52.00 04/12/2018   LDLDIRECT 141.0 04/12/2018   LDLCALC 129 (H) 04/12/2013   ALT 19 04/12/2018   AST 11 04/12/2018   NA 144 04/12/2018   K 3.6 04/12/2018   CL 105 04/12/2018   CREATININE 0.97  04/12/2018   BUN 18 04/12/2018   CO2 32 04/12/2018   TSH 2.77 04/12/2018   HGBA1C 5.5 04/21/2014   Assessment and Plan: See notes  Follow Up Instructions: See notes   I discussed the assessment and treatment plan with the patient. The patient was provided an opportunity to ask questions and all were answered. The patient agreed with the plan and demonstrated an understanding of the instructions.   The patient was advised to call back or seek an in-person evaluation if the symptoms worsen or if the condition fails to improve as anticipated.   Cathlean Cower, MD

## 2019-07-06 ENCOUNTER — Emergency Department
Admission: EM | Admit: 2019-07-06 | Discharge: 2019-07-06 | Disposition: A | Discharge status: Home / Self Care | Payer: BC Managed Care – PPO | Source: Home / Self Care | Attending: Family Medicine | Admitting: Family Medicine

## 2019-07-06 ENCOUNTER — Other Ambulatory Visit: Payer: Self-pay

## 2019-07-06 DIAGNOSIS — R300 Dysuria: Secondary | ICD-10-CM | POA: Diagnosis not present

## 2019-07-06 DIAGNOSIS — R3915 Urgency of urination: Secondary | ICD-10-CM

## 2019-07-06 LAB — POCT URINALYSIS DIP (MANUAL ENTRY)
Bilirubin, UA: NEGATIVE
Glucose, UA: NEGATIVE mg/dL
Ketones, POC UA: NEGATIVE mg/dL
Leukocytes, UA: NEGATIVE
Nitrite, UA: NEGATIVE
Protein Ur, POC: NEGATIVE mg/dL
Spec Grav, UA: 1.02 (ref 1.010–1.025)
Urobilinogen, UA: 1 E.U./dL
pH, UA: 6.5 (ref 5.0–8.0)

## 2019-07-06 MED ORDER — NITROFURANTOIN MONOHYD MACRO 100 MG PO CAPS: 100.0000 mg | ORAL_CAPSULE | Freq: Two times a day (BID) | ORAL | 0 refills | Status: AC

## 2019-07-06 NOTE — ED Triage Notes (Signed)
Pt c/o urinary urgency and intermittent lower back pain x 2 weeks. Wants to r/o UTI

## 2019-07-06 NOTE — Discharge Instructions (Addendum)
Increase fluid intake.  Begin antibiotic if urine culture is positive. May use non-prescription AZO for about two days, if desired, to decrease urinary discomfort.   If symptoms become significantly worse during the night or over the weekend, proceed to the local emergency room.

## 2019-07-06 NOTE — ED Provider Notes (Signed)
[facility]    CSN: [id number] Arrival date & time: 07/06/19  1442      History   Chief Complaint Chief Complaint  Patient presents with  . Polyuria    urgency    HPI Caitlin Greene is a 54 y.o. female.   Patient complains of mild urgency, hesitancy, and vague lower back ache for about two weeks.  She denies fevers, chills, and sweats, nausea/vomiting, and abdominal/pelvic pain.  She states that she had finished an antibiotic for sinus infection two weeks ago.  The history is provided by the patient.    Past Medical History:  Diagnosis Date  . ALLERGIC RHINITIS   . ANEMIA-IRON DEFICIENCY   . ANXIETY   . BREAST CANCER, HX OF 01/21/2007   at 54yo  . DEPRESSION   . GERD   . HYPERLIPIDEMIA   . RENAL CALCULUS     Patient Active Problem List   Diagnosis Date Noted  . Encounter for well adult exam with abnormal findings 04/12/2018  . Deviated septum 01/13/2016  . Nasal turbinate hypertrophy 12/11/2015  . Rhinitis, chronic 12/11/2015  . Acute sinus infection 07/30/2014  . LLL pneumonia 06/04/2014  . Chronic sinusitis 01/25/2012  . Vertigo 01/25/2012  . ETD (eustachian tube dysfunction) 10/01/2011  . HEMATOCHEZIA 05/31/2010  . ANEMIA-IRON DEFICIENCY 10/11/2007  . GERD 10/11/2007  . HYPERLIPIDEMIA 01/21/2007  . Anxiety state 01/21/2007  . Depression 01/21/2007  . Allergic rhinitis 01/21/2007  . RENAL CALCULUS 01/21/2007  . BREAST CANCER, HX OF 01/21/2007    Past Surgical History:  Procedure Laterality Date  . BREAST ENHANCEMENT SURGERY    . MASTECTOMY     right  . TEMPOROMANDIBULAR JOINT SURGERY     Left  . TONSILLECTOMY      OB History   No obstetric history on file.      Home Medications    Prior to Admission medications   Medication Sig Start Date End Date Taking? Authorizing Provider  cetirizine (ZYRTEC) 10 MG tablet Take 10 mg by mouth daily.    [provider]  FLUoxetine (PROZAC) 20 MG capsule TAKE 1 CAPSULE BY MOUTH  EVERY DAY.NEED APPT FOR REFILLS 08/15/18   Biagio Borg, MD  meclizine (ANTIVERT) 12.5 MG tablet Take 1 tablet (12.5 mg total) by mouth 3 (three) times daily as needed for dizziness. 08/23/18 08/23/19  Biagio Borg, MD  naproxen sodium (ANAPROX) 220 MG tablet Take 220 mg by mouth 2 (two) times daily as needed. For pain    [provider]  nitrofurantoin, macrocrystal-monohydrate, (MACROBID) 100 MG capsule Take 1 capsule (100 mg total) by mouth 2 (two) times daily for 7 days. (Rx void after 07/10/19) 07/06/19 07/13/19  Kandra Nicolas, MD    Family History Family History  Problem Relation Age of Onset  . Colon polyps Mother   . Cancer Mother        Uterine Cancer  . Hypertension Mother   . Dementia Mother   . Colon polyps Father   . COPD Father        smoked  . Hypertension Other   . Diabetes Other   . Asthma Sister     Social History Social History   Tobacco Use  . Smoking status: Never Smoker  . Smokeless tobacco: Never Used  Substance Use Topics  . Alcohol use: No    Alcohol/week: 0.0 standard drinks  . Drug use: No     Allergies   Amoxicillin-pot clavulanate and Codeine   Review  of Systems Review of Systems  Constitutional: Negative for activity change, appetite change, chills, diaphoresis, fatigue and fever.  HENT: Negative.   Eyes: Negative.   Respiratory: Negative.   Cardiovascular: Negative.   Gastrointestinal: Negative.   Genitourinary: Positive for frequency and urgency. Negative for difficulty urinating, dysuria and flank pain.  Musculoskeletal: Negative.   Skin: Negative.   Neurological: Negative for headaches.     Physical Exam Triage Vital Signs ED Triage Vitals [07/06/19 1501]  Enc Vitals Group     BP 139/83     Pulse Rate (!) 112     Resp 18     Temp 97.7 F (36.5 C)     Temp Source Oral     SpO2 97 %     Weight      Height      Head Circumference      Peak Flow      Pain Score 2     Pain Loc      Pain Edu?      Excl. in [place]?      No data found.  Updated Vital Signs BP 139/83 (BP Location: Left Arm)   Pulse (!) 112   Temp 97.7 F (36.5 C) (Oral)   Resp 18   LMP 06/09/2017 (Exact Date)   SpO2 97%   Visual Acuity Right Eye Distance:   Left Eye Distance:   Bilateral Distance:    Right Eye Near:   Left Eye Near:    Bilateral Near:     Physical Exam Nursing notes and Vital Signs reviewed. Appearance:  Patient appears stated age, and in no acute distress.    Eyes:  Pupils are equal, round, and reactive to light and accomodation.  Extraocular movement is intact.  Conjunctivae are not inflamed   Pharynx:  Normal; moist mucous membranes  Neck:  Supple.  No adenopathy Lungs:  Clear to auscultation.  Breath sounds are equal.  Moving air well. Heart:  Regular rate and rhythm without murmurs, rubs, or gallops.  Abdomen:  Mild tenderness over bladder without masses or hepatosplenomegaly.  Bowel sounds are present.  No CVA or flank tenderness.  Extremities:  No edema.  Skin:  No rash present.     UC Treatments / Results  Labs (all labs ordered are listed, but only abnormal results are displayed) Labs Reviewed  POCT URINALYSIS DIP (MANUAL ENTRY) - Abnormal; Notable for the following components:      Result Value   Blood, UA trace-intact (*)    All other components within normal limits  URINE CULTURE    EKG   Radiology No results found.  Procedures Procedures (including critical care time)  Medications Ordered in UC Medications - No data to display  Initial Impression / Assessment and Plan / UC Course  I have reviewed the triage vital signs and the nursing notes.  Pertinent labs & imaging results that were available during my care of the patient were reviewed by me and considered in my medical decision making (see chart for details).    ?UTI.  Urine culture pending.  Given Rx for Macrobid to hold.  Followup with Family Doctor if not improved in about 6 days.  Final Clinical Impressions(s) /  UC Diagnoses   Final diagnoses:  Dysuria  Urinary urgency     Discharge Instructions     Increase fluid intake.  Begin antibiotic if urine culture is positive. May use non-prescription AZO for about two days, if desired, to decrease urinary discomfort.  If symptoms become significantly worse during the night or over the weekend, proceed to the local emergency room.     ED Prescriptions    Medication Sig Dispense Auth. Provider   nitrofurantoin, macrocrystal-monohydrate, (MACROBID) 100 MG capsule Take 1 capsule (100 mg total) by mouth 2 (two) times daily for 7 days. (Rx void after 07/10/19) 14 capsule Kandra Nicolas, MD        Kandra Nicolas, MD 07/07/19 [phone]

## 2019-07-08 LAB — URINE CULTURE
MICRO NUMBER:: 10197707
SPECIMEN QUALITY:: ADEQUATE

## 2019-08-15 DIAGNOSIS — N3946 Mixed incontinence: Secondary | ICD-10-CM | POA: Diagnosis not present

## 2019-08-15 DIAGNOSIS — R1032 Left lower quadrant pain: Secondary | ICD-10-CM | POA: Diagnosis not present

## 2019-08-15 DIAGNOSIS — R3915 Urgency of urination: Secondary | ICD-10-CM | POA: Diagnosis not present

## 2019-08-15 DIAGNOSIS — R1084 Generalized abdominal pain: Secondary | ICD-10-CM | POA: Diagnosis not present

## 2019-08-23 ENCOUNTER — Other Ambulatory Visit: Payer: Self-pay | Admitting: Internal Medicine

## 2019-09-19 ENCOUNTER — Other Ambulatory Visit: Payer: Self-pay | Admitting: Internal Medicine

## 2019-10-21 DIAGNOSIS — C50911 Malignant neoplasm of unspecified site of right female breast: Secondary | ICD-10-CM | POA: Diagnosis not present

## 2019-10-22 ENCOUNTER — Other Ambulatory Visit: Payer: Self-pay

## 2019-10-22 ENCOUNTER — Emergency Department (INDEPENDENT_AMBULATORY_CARE_PROVIDER_SITE_OTHER)
Admission: EM | Admit: 2019-10-22 | Discharge: 2019-10-22 | Disposition: A | Discharge status: Home / Self Care | Payer: BC Managed Care – PPO | Source: Home / Self Care | Attending: Family Medicine | Admitting: Family Medicine

## 2019-10-22 ENCOUNTER — Emergency Department (INDEPENDENT_AMBULATORY_CARE_PROVIDER_SITE_OTHER): Payer: BC Managed Care – PPO

## 2019-10-22 DIAGNOSIS — R1031 Right lower quadrant pain: Secondary | ICD-10-CM

## 2019-10-22 DIAGNOSIS — R0781 Pleurodynia: Secondary | ICD-10-CM | POA: Diagnosis not present

## 2019-10-22 DIAGNOSIS — R109 Unspecified abdominal pain: Secondary | ICD-10-CM | POA: Diagnosis not present

## 2019-10-22 DIAGNOSIS — R1011 Right upper quadrant pain: Secondary | ICD-10-CM

## 2019-10-22 LAB — COMPLETE METABOLIC PANEL WITH GFR
AG Ratio: 1.8 (calc) (ref 1.0–2.5)
ALT: 16 U/L (ref 6–29)
AST: 14 U/L (ref 10–35)
Albumin: 4.3 g/dL (ref 3.6–5.1)
Alkaline phosphatase (APISO): 118 U/L (ref 37–153)
BUN: 22 mg/dL (ref 7–25)
CO2: 22 mmol/L (ref 20–32)
Calcium: 9.4 mg/dL (ref 8.6–10.4)
Chloride: 108 mmol/L (ref 98–110)
Creat: 0.88 mg/dL (ref 0.50–1.05)
GFR, Est African American: 86 mL/min/{1.73_m2} (ref >=60)
GFR, Est Non African American: 74 mL/min/{1.73_m2} (ref >=60)
Globulin: 2.4 g/dL (calc) (ref 1.9–3.7)
Glucose, Bld: 94 mg/dL (ref 65–99)
Potassium: 4 mmol/L (ref 3.5–5.3)
Sodium: 139 mmol/L (ref 135–146)
Total Bilirubin: 0.7 mg/dL (ref 0.2–1.2)
Total Protein: 6.7 g/dL (ref 6.1–8.1)

## 2019-10-22 LAB — POCT CBC W AUTO DIFF (K'VILLE URGENT CARE)

## 2019-10-22 MED ORDER — CYCLOBENZAPRINE HCL 10 MG PO TABS: 10.0000 mg | ORAL_TABLET | Freq: Three times a day (TID) | ORAL | 1 refills | Status: DC | PRN

## 2019-10-22 NOTE — ED Triage Notes (Signed)
Patient presents to Urgent Care with complaints of abdominal and back muscle pains and spasms since a few weeks, worse recently. Patient reports she has seen a chiropractor for it and was told her muscles are being pulled forward and that is why it hurts so badly. Pt works a lot, stationary position. Pt is about to leave for the beach and would like some relief before she leaves.

## 2019-10-22 NOTE — Discharge Instructions (Addendum)
Increase fluid intake.  Recommend taking daily Miralax, approximately 1/2 to 1 capful mixed in 4 to 8 ounces of water until bowel movements are regular. Recommend increasing natural fiber in diet.  May also take a daily fiber product such as Citrucel with plenty of fluid.

## 2019-10-22 NOTE — ED Provider Notes (Signed)
[name]    CSN: [id number] Arrival date & time: 10/22/19  1134      History   Chief Complaint Chief Complaint  Patient presents with  . Abdominal Pain  . Back Pain    HPI Caitlin Greene is a 55 y.o. female.   Patient complains of onset of right posterior chest muscle spasms about two months ago, with pain now radiating to her right abdomen.  She reports that she has been constipated recently.  She denies nausea/vomiting and fevers, chills, and sweats.  She has not had improvement with chiropractic treatment.  Aleve helps somewhat. Past history includes right breast CA, s/p right mastectomy and axillary dissection.   The history is provided by the patient.  Abdominal Pain Pain location:  RLQ and RUQ Pain quality: cramping   Pain radiates to:  Back Pain severity:  Mild Onset quality:  Gradual Duration:  1 week Timing:  Constant Progression:  Unchanged Chronicity:  New Context: retching   Context: not awakening from sleep, not diet changes, not eating, not recent illness and not recent travel   Relieved by:  NSAIDs Worsened by:  Nothing Ineffective treatments:  None tried Associated symptoms: constipation   Associated symptoms: no anorexia, no belching, no chest pain, no chills, no cough, no diarrhea, no dysuria, no fatigue, no fever, no hematemesis, no hematochezia, no hematuria, no melena, no nausea, no shortness of breath and no vomiting     Past Medical History:  Diagnosis Date  . ALLERGIC RHINITIS   . ANEMIA-IRON DEFICIENCY   . ANXIETY   . BREAST CANCER, HX OF 01/21/2007   at 54yo  . DEPRESSION   . GERD   . HYPERLIPIDEMIA   . RENAL CALCULUS     Patient Active Problem List   Diagnosis Date Noted  . Encounter for well adult exam with abnormal findings 04/12/2018  . Deviated septum 01/13/2016  . Nasal turbinate hypertrophy 12/11/2015  . Rhinitis, chronic 12/11/2015  . Acute sinus infection 07/30/2014  . LLL pneumonia 06/04/2014  . Chronic  sinusitis 01/25/2012  . Vertigo 01/25/2012  . ETD (eustachian tube dysfunction) 10/01/2011  . HEMATOCHEZIA 05/31/2010  . ANEMIA-IRON DEFICIENCY 10/11/2007  . GERD 10/11/2007  . HYPERLIPIDEMIA 01/21/2007  . Anxiety state 01/21/2007  . Depression 01/21/2007  . Allergic rhinitis 01/21/2007  . RENAL CALCULUS 01/21/2007  . BREAST CANCER, HX OF 01/21/2007    Past Surgical History:  Procedure Laterality Date  . BREAST ENHANCEMENT SURGERY    . MASTECTOMY     right  . TEMPOROMANDIBULAR JOINT SURGERY     Left  . TONSILLECTOMY      OB History   No obstetric history on file.      Home Medications    Prior to Admission medications   Medication Sig Start Date End Date Taking? Authorizing Provider  cetirizine (ZYRTEC) 10 MG tablet Take 10 mg by mouth daily.    [provider]  cyclobenzaprine (FLEXERIL) 10 MG tablet Take 1 tablet (10 mg total) by mouth 3 (three) times daily as needed for muscle spasms. 10/22/19   Kandra Nicolas, MD  FLUoxetine (PROZAC) 20 MG capsule TAKE 1 CAPSULE BY MOUTH EVERY DAY 09/19/19   Biagio Borg, MD  naproxen sodium (ANAPROX) 220 MG tablet Take 220 mg by mouth 2 (two) times daily as needed. For pain    [provider]    Family History Family History  Problem Relation Age of Onset  . Colon polyps Mother   . Cancer  Mother        Uterine Cancer  . Hypertension Mother   . Dementia Mother   . Colon polyps Father   . COPD Father        smoked  . Hypertension Other   . Diabetes Other   . Asthma Sister     Social History Social History   Tobacco Use  . Smoking status: Never Smoker  . Smokeless tobacco: Never Used  Vaping Use  . Vaping Use: Never used  Substance Use Topics  . Alcohol use: No    Alcohol/week: 0.0 standard drinks  . Drug use: No     Allergies   Amoxicillin-pot clavulanate and Codeine   Review of Systems Review of Systems  Constitutional: Negative for activity change, appetite change, chills,  diaphoresis, fatigue, fever and unexpected weight change.  Respiratory: Negative for cough and shortness of breath.   Cardiovascular: Negative for chest pain.  Gastrointestinal: Positive for abdominal pain and constipation. Negative for abdominal distention, anorexia, blood in stool, diarrhea, hematemesis, hematochezia, melena, nausea and vomiting.  Genitourinary: Negative for dysuria, frequency, hematuria and pelvic pain.  All other systems reviewed and are negative.    Physical Exam Triage Vital Signs ED Triage Vitals  Enc Vitals Group     BP 10/22/19 1147 114/80     Pulse Rate 10/22/19 1147 90     Resp 10/22/19 1147 18     Temp 10/22/19 1147 97.8 F (36.6 C)     Temp Source 10/22/19 1147 Oral     SpO2 10/22/19 1147 98 %     Weight --      Height --      Head Circumference --      Peak Flow --      Pain Score 10/22/19 1146 9     Pain Loc --      Pain Edu? --      Excl. in [place]? --    No data found.  Updated Vital Signs BP 114/80 (BP Location: Left Arm)   Pulse 90   Temp 97.8 F (36.6 C) (Oral)   Resp 18   LMP 06/09/2017 (Exact Date)   SpO2 98%   Visual Acuity Right Eye Distance:   Left Eye Distance:   Bilateral Distance:    Right Eye Near:   Left Eye Near:    Bilateral Near:     Physical Exam Vitals and nursing note reviewed.  Constitutional:      General: She is not in acute distress. HENT:     Head: Normocephalic.     Nose: Nose normal.     Mouth/Throat:     Pharynx: Oropharynx is clear.  Eyes:     Pupils: Pupils are equal, round, and reactive to light.  Cardiovascular:     Rate and Rhythm: Normal rate.     Heart sounds: Normal heart sounds.  Pulmonary:     Effort: Pulmonary effort is normal.     Breath sounds: Normal breath sounds.       Comments: Right posterior/lateral rib tenderness to palpation as noted on diagram.  Chest:     Chest wall: Tenderness present.    Abdominal:     General: Abdomen is flat. There is no distension.      Palpations: There is no mass.     Tenderness: There is abdominal tenderness. There is no right CVA tenderness, left CVA tenderness or guarding.     Hernia: No hernia is present.  Comments: Distinct right upper quadrant and right lower quadrant tenderness to palpation.  Musculoskeletal:     Right lower leg: No edema.     Left lower leg: No edema.  Skin:    General: Skin is warm and dry.     Findings: No rash.  Neurological:     Mental Status: She is alert and oriented to person, place, and time.      UC Treatments / Results  Labs (all labs ordered are listed, but only abnormal results are displayed) Labs Reviewed  COMPLETE METABOLIC PANEL WITH GFR  TSH  POCT CBC W AUTO DIFF ([facility]):  WBC 7.4; LY 28.1; MO 2.9; GR 69.0; Hgb 13.9; Platelets 256     EKG   Radiology DG Ribs Unilateral W/Chest Right  Result Date: 10/22/2019 CLINICAL DATA:  Right lower rib pain for 1 week.  No known injury. EXAM: RIGHT RIBS AND CHEST - 3+ VIEW COMPARISON:  PA and lateral chest 07/13/2015. FINDINGS: Lungs clear. Heart size normal. No pneumothorax or pleural effusion. The patient is status post right mastectomy and axillary dissection. No fracture or other focal bony abnormality is identified. IMPRESSION: No acute disease.  No bony abnormality is identified. Status post right mastectomy and axillary dissection. Electronically Signed   By: Inge Rise M.D.   On: 10/22/2019 13:08   DG Abd 2 Views  Result Date: 10/22/2019 CLINICAL DATA:  Abdominal pain. EXAM: ABDOMEN - 2 VIEW COMPARISON:  08/15/2019. FINDINGS: Breast implants. Bibasilar atelectasis. Soft tissue structures are unremarkable. Pelvic calcifications consistent phleboliths. Stool noted throughout the colon. No free air. Lumbar spine scoliosis concave left. Degenerative change lumbar spine and both hips. IMPRESSION: No acute intra-abdominal abnormality identified. Stool noted throughout the colon. Electronically Signed   By:  Marcello Moores  Register   On: 10/22/2019 13:56    Procedures Procedures (including critical care time)  Medications Ordered in UC Medications - No data to display  Initial Impression / Assessment and Plan / UC Course  I have reviewed the triage vital signs and the nursing notes.  Pertinent labs & imaging results that were available during my care of the patient were reviewed by me and considered in my medical decision making (see chart for details).    Normal WBC and negative chest X-ray reassuring.  No rib lesions evident on x-ray. Two view abdominal x-ray shows stool throughout the colon.  Suspect constipation as cause of abdominal pain. Check TSH and CMP. Rx for Flexeril. Followup with Family Doctor in about 10 to 14 days.   Final Clinical Impressions(s) / UC Diagnoses   Final diagnoses:  Rib pain on right side  Right upper quadrant abdominal pain  Right lower quadrant abdominal pain     Discharge Instructions     Increase fluid intake.  Recommend taking daily Miralax, approximately 1/2 to 1 capful mixed in 4 to 8 ounces of water until bowel movements are regular. Recommend increasing natural fiber in diet.  May also take a daily fiber product such as Citrucel with plenty of fluid.     ED Prescriptions    Medication Sig Dispense Auth. Provider   cyclobenzaprine (FLEXERIL) 10 MG tablet Take 1 tablet (10 mg total) by mouth 3 (three) times daily as needed for muscle spasms. 20 tablet Kandra Nicolas, MD        Kandra Nicolas, MD 10/23/19 [phone]

## 2019-10-24 ENCOUNTER — Telehealth: Payer: Self-pay

## 2019-10-24 DIAGNOSIS — K449 Diaphragmatic hernia without obstruction or gangrene: Secondary | ICD-10-CM | POA: Diagnosis not present

## 2019-10-24 DIAGNOSIS — R1011 Right upper quadrant pain: Secondary | ICD-10-CM | POA: Diagnosis not present

## 2019-10-24 DIAGNOSIS — F418 Other specified anxiety disorders: Secondary | ICD-10-CM | POA: Diagnosis not present

## 2019-10-24 DIAGNOSIS — R9431 Abnormal electrocardiogram [ECG] [EKG]: Secondary | ICD-10-CM | POA: Diagnosis not present

## 2019-10-24 DIAGNOSIS — M5126 Other intervertebral disc displacement, lumbar region: Secondary | ICD-10-CM | POA: Diagnosis not present

## 2019-10-24 DIAGNOSIS — Z743 Need for continuous supervision: Secondary | ICD-10-CM | POA: Diagnosis not present

## 2019-10-24 DIAGNOSIS — K219 Gastro-esophageal reflux disease without esophagitis: Secondary | ICD-10-CM | POA: Diagnosis not present

## 2019-10-24 DIAGNOSIS — K5909 Other constipation: Secondary | ICD-10-CM | POA: Diagnosis not present

## 2019-10-24 DIAGNOSIS — M4856XA Collapsed vertebra, not elsewhere classified, lumbar region, initial encounter for fracture: Secondary | ICD-10-CM | POA: Diagnosis not present

## 2019-10-24 DIAGNOSIS — R457 State of emotional shock and stress, unspecified: Secondary | ICD-10-CM | POA: Diagnosis not present

## 2019-10-24 DIAGNOSIS — J343 Hypertrophy of nasal turbinates: Secondary | ICD-10-CM | POA: Diagnosis not present

## 2019-10-24 DIAGNOSIS — M4802 Spinal stenosis, cervical region: Secondary | ICD-10-CM | POA: Diagnosis not present

## 2019-10-24 DIAGNOSIS — G4700 Insomnia, unspecified: Secondary | ICD-10-CM | POA: Diagnosis not present

## 2019-10-24 DIAGNOSIS — M8458XA Pathological fracture in neoplastic disease, other specified site, initial encounter for fracture: Secondary | ICD-10-CM | POA: Diagnosis not present

## 2019-10-24 DIAGNOSIS — G9589 Other specified diseases of spinal cord: Secondary | ICD-10-CM | POA: Diagnosis not present

## 2019-10-24 DIAGNOSIS — M489 Spondylopathy, unspecified: Secondary | ICD-10-CM | POA: Diagnosis not present

## 2019-10-24 DIAGNOSIS — R918 Other nonspecific abnormal finding of lung field: Secondary | ICD-10-CM | POA: Diagnosis not present

## 2019-10-24 DIAGNOSIS — F419 Anxiety disorder, unspecified: Secondary | ICD-10-CM | POA: Diagnosis not present

## 2019-10-24 DIAGNOSIS — M5489 Other dorsalgia: Secondary | ICD-10-CM | POA: Diagnosis not present

## 2019-10-24 DIAGNOSIS — K5903 Drug induced constipation: Secondary | ICD-10-CM | POA: Diagnosis not present

## 2019-10-24 DIAGNOSIS — Z66 Do not resuscitate: Secondary | ICD-10-CM | POA: Diagnosis not present

## 2019-10-24 DIAGNOSIS — Z853 Personal history of malignant neoplasm of breast: Secondary | ICD-10-CM | POA: Diagnosis not present

## 2019-10-24 DIAGNOSIS — M488X5 Other specified spondylopathies, thoracolumbar region: Secondary | ICD-10-CM | POA: Diagnosis not present

## 2019-10-24 DIAGNOSIS — Z79899 Other long term (current) drug therapy: Secondary | ICD-10-CM | POA: Diagnosis not present

## 2019-10-24 DIAGNOSIS — C801 Malignant (primary) neoplasm, unspecified: Secondary | ICD-10-CM | POA: Diagnosis not present

## 2019-10-24 DIAGNOSIS — K649 Unspecified hemorrhoids: Secondary | ICD-10-CM | POA: Diagnosis not present

## 2019-10-24 DIAGNOSIS — R0902 Hypoxemia: Secondary | ICD-10-CM | POA: Diagnosis not present

## 2019-10-24 DIAGNOSIS — J342 Deviated nasal septum: Secondary | ICD-10-CM | POA: Diagnosis not present

## 2019-10-24 DIAGNOSIS — Z20822 Contact with and (suspected) exposure to covid-19: Secondary | ICD-10-CM | POA: Diagnosis not present

## 2019-10-24 DIAGNOSIS — M4854XA Collapsed vertebra, not elsewhere classified, thoracic region, initial encounter for fracture: Secondary | ICD-10-CM | POA: Diagnosis not present

## 2019-10-24 DIAGNOSIS — R109 Unspecified abdominal pain: Secondary | ICD-10-CM | POA: Diagnosis not present

## 2019-10-24 DIAGNOSIS — J0141 Acute recurrent pansinusitis: Secondary | ICD-10-CM | POA: Diagnosis not present

## 2019-10-24 DIAGNOSIS — M8448XA Pathological fracture, other site, initial encounter for fracture: Secondary | ICD-10-CM | POA: Diagnosis not present

## 2019-10-24 DIAGNOSIS — X58XXXA Exposure to other specified factors, initial encounter: Secondary | ICD-10-CM | POA: Diagnosis not present

## 2019-10-24 DIAGNOSIS — Z9011 Acquired absence of right breast and nipple: Secondary | ICD-10-CM | POA: Diagnosis not present

## 2019-10-24 DIAGNOSIS — Y999 Unspecified external cause status: Secondary | ICD-10-CM | POA: Diagnosis not present

## 2019-10-24 DIAGNOSIS — C50919 Malignant neoplasm of unspecified site of unspecified female breast: Secondary | ICD-10-CM | POA: Diagnosis not present

## 2019-10-24 DIAGNOSIS — S22009A Unspecified fracture of unspecified thoracic vertebra, initial encounter for closed fracture: Secondary | ICD-10-CM | POA: Diagnosis not present

## 2019-10-24 DIAGNOSIS — R279 Unspecified lack of coordination: Secondary | ICD-10-CM | POA: Diagnosis not present

## 2019-10-24 DIAGNOSIS — C7951 Secondary malignant neoplasm of bone: Secondary | ICD-10-CM | POA: Diagnosis not present

## 2019-10-24 DIAGNOSIS — R52 Pain, unspecified: Secondary | ICD-10-CM | POA: Diagnosis not present

## 2019-10-24 DIAGNOSIS — K5900 Constipation, unspecified: Secondary | ICD-10-CM | POA: Diagnosis not present

## 2019-10-24 DIAGNOSIS — F329 Major depressive disorder, single episode, unspecified: Secondary | ICD-10-CM | POA: Diagnosis not present

## 2019-10-24 DIAGNOSIS — K5790 Diverticulosis of intestine, part unspecified, without perforation or abscess without bleeding: Secondary | ICD-10-CM | POA: Diagnosis not present

## 2019-10-24 DIAGNOSIS — K2100 Gastro-esophageal reflux disease with esophagitis, without bleeding: Secondary | ICD-10-CM | POA: Diagnosis not present

## 2019-10-24 DIAGNOSIS — K625 Hemorrhage of anus and rectum: Secondary | ICD-10-CM | POA: Diagnosis not present

## 2019-10-24 DIAGNOSIS — M50223 Other cervical disc displacement at C6-C7 level: Secondary | ICD-10-CM | POA: Diagnosis not present

## 2019-10-24 DIAGNOSIS — S32000A Wedge compression fracture of unspecified lumbar vertebra, initial encounter for closed fracture: Secondary | ICD-10-CM | POA: Diagnosis not present

## 2019-10-24 LAB — TSH: TSH: 3.43 mIU/L

## 2019-10-24 NOTE — Telephone Encounter (Signed)
Pt called with continued belly pain and now rectal bleeding. Per Junie Panning, after reviewing notes, pt should be evaluated further at ED

## 2019-10-25 DIAGNOSIS — F419 Anxiety disorder, unspecified: Secondary | ICD-10-CM | POA: Diagnosis not present

## 2019-10-25 DIAGNOSIS — M489 Spondylopathy, unspecified: Secondary | ICD-10-CM | POA: Diagnosis not present

## 2019-10-25 DIAGNOSIS — C7951 Secondary malignant neoplasm of bone: Secondary | ICD-10-CM | POA: Diagnosis not present

## 2019-10-25 DIAGNOSIS — K5900 Constipation, unspecified: Secondary | ICD-10-CM | POA: Diagnosis not present

## 2019-10-25 DIAGNOSIS — M488X5 Other specified spondylopathies, thoracolumbar region: Secondary | ICD-10-CM | POA: Diagnosis not present

## 2019-10-25 DIAGNOSIS — K219 Gastro-esophageal reflux disease without esophagitis: Secondary | ICD-10-CM | POA: Diagnosis not present

## 2019-10-25 DIAGNOSIS — R918 Other nonspecific abnormal finding of lung field: Secondary | ICD-10-CM | POA: Diagnosis not present

## 2019-10-25 DIAGNOSIS — C50919 Malignant neoplasm of unspecified site of unspecified female breast: Secondary | ICD-10-CM | POA: Diagnosis not present

## 2019-10-26 DIAGNOSIS — F419 Anxiety disorder, unspecified: Secondary | ICD-10-CM | POA: Diagnosis not present

## 2019-10-26 DIAGNOSIS — K5900 Constipation, unspecified: Secondary | ICD-10-CM | POA: Diagnosis not present

## 2019-10-26 DIAGNOSIS — M489 Spondylopathy, unspecified: Secondary | ICD-10-CM | POA: Diagnosis not present

## 2019-10-26 DIAGNOSIS — C7951 Secondary malignant neoplasm of bone: Secondary | ICD-10-CM | POA: Diagnosis not present

## 2019-10-27 DIAGNOSIS — F419 Anxiety disorder, unspecified: Secondary | ICD-10-CM | POA: Diagnosis not present

## 2019-10-27 DIAGNOSIS — M489 Spondylopathy, unspecified: Secondary | ICD-10-CM | POA: Diagnosis not present

## 2019-10-27 DIAGNOSIS — C7951 Secondary malignant neoplasm of bone: Secondary | ICD-10-CM | POA: Diagnosis not present

## 2019-10-27 DIAGNOSIS — K5900 Constipation, unspecified: Secondary | ICD-10-CM | POA: Diagnosis not present

## 2019-10-30 DIAGNOSIS — F419 Anxiety disorder, unspecified: Secondary | ICD-10-CM | POA: Diagnosis not present

## 2019-10-30 DIAGNOSIS — F329 Major depressive disorder, single episode, unspecified: Secondary | ICD-10-CM | POA: Diagnosis not present

## 2019-10-30 DIAGNOSIS — M8458XA Pathological fracture in neoplastic disease, other specified site, initial encounter for fracture: Secondary | ICD-10-CM | POA: Diagnosis not present

## 2019-10-30 DIAGNOSIS — M4856XA Collapsed vertebra, not elsewhere classified, lumbar region, initial encounter for fracture: Secondary | ICD-10-CM | POA: Diagnosis not present

## 2019-10-30 DIAGNOSIS — C7951 Secondary malignant neoplasm of bone: Secondary | ICD-10-CM | POA: Diagnosis not present

## 2019-10-30 DIAGNOSIS — K2100 Gastro-esophageal reflux disease with esophagitis, without bleeding: Secondary | ICD-10-CM | POA: Diagnosis not present

## 2019-10-30 DIAGNOSIS — C801 Malignant (primary) neoplasm, unspecified: Secondary | ICD-10-CM | POA: Diagnosis not present

## 2019-10-30 DIAGNOSIS — Y999 Unspecified external cause status: Secondary | ICD-10-CM | POA: Diagnosis not present

## 2019-10-30 DIAGNOSIS — K219 Gastro-esophageal reflux disease without esophagitis: Secondary | ICD-10-CM | POA: Diagnosis not present

## 2019-10-30 DIAGNOSIS — Z9011 Acquired absence of right breast and nipple: Secondary | ICD-10-CM | POA: Diagnosis not present

## 2019-10-30 DIAGNOSIS — S32000A Wedge compression fracture of unspecified lumbar vertebra, initial encounter for closed fracture: Secondary | ICD-10-CM | POA: Diagnosis not present

## 2019-10-30 DIAGNOSIS — X58XXXA Exposure to other specified factors, initial encounter: Secondary | ICD-10-CM | POA: Diagnosis not present

## 2019-10-30 DIAGNOSIS — K5903 Drug induced constipation: Secondary | ICD-10-CM | POA: Diagnosis not present

## 2019-10-30 DIAGNOSIS — M4854XA Collapsed vertebra, not elsewhere classified, thoracic region, initial encounter for fracture: Secondary | ICD-10-CM | POA: Diagnosis not present

## 2019-10-30 DIAGNOSIS — K5790 Diverticulosis of intestine, part unspecified, without perforation or abscess without bleeding: Secondary | ICD-10-CM | POA: Diagnosis not present

## 2019-10-30 DIAGNOSIS — K449 Diaphragmatic hernia without obstruction or gangrene: Secondary | ICD-10-CM | POA: Diagnosis not present

## 2019-10-30 DIAGNOSIS — Z66 Do not resuscitate: Secondary | ICD-10-CM | POA: Insufficient documentation

## 2019-10-30 DIAGNOSIS — Z853 Personal history of malignant neoplasm of breast: Secondary | ICD-10-CM | POA: Diagnosis not present

## 2019-10-30 DIAGNOSIS — Z79899 Other long term (current) drug therapy: Secondary | ICD-10-CM | POA: Diagnosis not present

## 2019-10-31 DIAGNOSIS — M545 Low back pain: Secondary | ICD-10-CM | POA: Diagnosis not present

## 2019-10-31 DIAGNOSIS — K219 Gastro-esophageal reflux disease without esophagitis: Secondary | ICD-10-CM | POA: Diagnosis not present

## 2019-10-31 DIAGNOSIS — F329 Major depressive disorder, single episode, unspecified: Secondary | ICD-10-CM | POA: Diagnosis not present

## 2019-10-31 DIAGNOSIS — M4854XA Collapsed vertebra, not elsewhere classified, thoracic region, initial encounter for fracture: Secondary | ICD-10-CM | POA: Diagnosis not present

## 2019-10-31 DIAGNOSIS — C801 Malignant (primary) neoplasm, unspecified: Secondary | ICD-10-CM | POA: Diagnosis not present

## 2019-10-31 DIAGNOSIS — M549 Dorsalgia, unspecified: Secondary | ICD-10-CM | POA: Diagnosis not present

## 2019-10-31 DIAGNOSIS — S32000A Wedge compression fracture of unspecified lumbar vertebra, initial encounter for closed fracture: Secondary | ICD-10-CM | POA: Diagnosis not present

## 2019-10-31 DIAGNOSIS — C7951 Secondary malignant neoplasm of bone: Secondary | ICD-10-CM | POA: Diagnosis not present

## 2019-11-01 DIAGNOSIS — Z743 Need for continuous supervision: Secondary | ICD-10-CM | POA: Diagnosis not present

## 2019-11-01 DIAGNOSIS — S32000A Wedge compression fracture of unspecified lumbar vertebra, initial encounter for closed fracture: Secondary | ICD-10-CM | POA: Diagnosis not present

## 2019-11-01 DIAGNOSIS — F329 Major depressive disorder, single episode, unspecified: Secondary | ICD-10-CM | POA: Diagnosis not present

## 2019-11-01 DIAGNOSIS — F419 Anxiety disorder, unspecified: Secondary | ICD-10-CM | POA: Diagnosis not present

## 2019-11-01 DIAGNOSIS — M4854XA Collapsed vertebra, not elsewhere classified, thoracic region, initial encounter for fracture: Secondary | ICD-10-CM | POA: Diagnosis not present

## 2019-11-01 DIAGNOSIS — M459 Ankylosing spondylitis of unspecified sites in spine: Secondary | ICD-10-CM | POA: Diagnosis not present

## 2019-11-01 DIAGNOSIS — R0902 Hypoxemia: Secondary | ICD-10-CM | POA: Diagnosis not present

## 2019-11-01 DIAGNOSIS — I10 Essential (primary) hypertension: Secondary | ICD-10-CM | POA: Diagnosis not present

## 2019-11-05 DIAGNOSIS — C50911 Malignant neoplasm of unspecified site of right female breast: Secondary | ICD-10-CM | POA: Diagnosis not present

## 2019-11-06 DIAGNOSIS — M545 Low back pain: Secondary | ICD-10-CM | POA: Diagnosis not present

## 2019-11-06 DIAGNOSIS — S32000A Wedge compression fracture of unspecified lumbar vertebra, initial encounter for closed fracture: Secondary | ICD-10-CM | POA: Diagnosis not present

## 2019-11-12 DIAGNOSIS — Z170 Estrogen receptor positive status [ER+]: Secondary | ICD-10-CM | POA: Diagnosis not present

## 2019-11-12 DIAGNOSIS — F329 Major depressive disorder, single episode, unspecified: Secondary | ICD-10-CM | POA: Diagnosis not present

## 2019-11-12 DIAGNOSIS — G893 Neoplasm related pain (acute) (chronic): Secondary | ICD-10-CM | POA: Diagnosis not present

## 2019-11-12 DIAGNOSIS — M2550 Pain in unspecified joint: Secondary | ICD-10-CM | POA: Diagnosis not present

## 2019-11-12 DIAGNOSIS — X58XXXA Exposure to other specified factors, initial encounter: Secondary | ICD-10-CM | POA: Diagnosis not present

## 2019-11-12 DIAGNOSIS — R52 Pain, unspecified: Secondary | ICD-10-CM | POA: Diagnosis not present

## 2019-11-12 DIAGNOSIS — C7951 Secondary malignant neoplasm of bone: Secondary | ICD-10-CM | POA: Diagnosis not present

## 2019-11-12 DIAGNOSIS — Z853 Personal history of malignant neoplasm of breast: Secondary | ICD-10-CM | POA: Diagnosis not present

## 2019-11-12 DIAGNOSIS — C50919 Malignant neoplasm of unspecified site of unspecified female breast: Secondary | ICD-10-CM | POA: Diagnosis not present

## 2019-11-12 DIAGNOSIS — M5489 Other dorsalgia: Secondary | ICD-10-CM | POA: Diagnosis not present

## 2019-11-12 DIAGNOSIS — K5900 Constipation, unspecified: Secondary | ICD-10-CM | POA: Diagnosis not present

## 2019-11-12 DIAGNOSIS — Z7401 Bed confinement status: Secondary | ICD-10-CM | POA: Diagnosis not present

## 2019-11-12 DIAGNOSIS — Z9889 Other specified postprocedural states: Secondary | ICD-10-CM | POA: Insufficient documentation

## 2019-11-12 DIAGNOSIS — C7989 Secondary malignant neoplasm of other specified sites: Secondary | ICD-10-CM | POA: Diagnosis not present

## 2019-11-12 DIAGNOSIS — C801 Malignant (primary) neoplasm, unspecified: Secondary | ICD-10-CM | POA: Diagnosis not present

## 2019-11-12 DIAGNOSIS — Z79891 Long term (current) use of opiate analgesic: Secondary | ICD-10-CM | POA: Diagnosis not present

## 2019-11-12 DIAGNOSIS — Z79899 Other long term (current) drug therapy: Secondary | ICD-10-CM | POA: Diagnosis not present

## 2019-11-12 DIAGNOSIS — K219 Gastro-esophageal reflux disease without esophagitis: Secondary | ICD-10-CM | POA: Diagnosis not present

## 2019-11-12 DIAGNOSIS — M8458XA Pathological fracture in neoplastic disease, other specified site, initial encounter for fracture: Secondary | ICD-10-CM | POA: Diagnosis not present

## 2019-11-13 DIAGNOSIS — Z79891 Long term (current) use of opiate analgesic: Secondary | ICD-10-CM | POA: Diagnosis not present

## 2019-11-13 DIAGNOSIS — C801 Malignant (primary) neoplasm, unspecified: Secondary | ICD-10-CM | POA: Diagnosis not present

## 2019-11-13 DIAGNOSIS — K5900 Constipation, unspecified: Secondary | ICD-10-CM | POA: Diagnosis not present

## 2019-11-13 DIAGNOSIS — M25552 Pain in left hip: Secondary | ICD-10-CM | POA: Diagnosis not present

## 2019-11-13 DIAGNOSIS — K219 Gastro-esophageal reflux disease without esophagitis: Secondary | ICD-10-CM | POA: Diagnosis not present

## 2019-11-13 DIAGNOSIS — Z9889 Other specified postprocedural states: Secondary | ICD-10-CM | POA: Diagnosis not present

## 2019-11-13 DIAGNOSIS — C7989 Secondary malignant neoplasm of other specified sites: Secondary | ICD-10-CM | POA: Diagnosis not present

## 2019-11-13 DIAGNOSIS — X58XXXA Exposure to other specified factors, initial encounter: Secondary | ICD-10-CM | POA: Diagnosis not present

## 2019-11-13 DIAGNOSIS — M8458XA Pathological fracture in neoplastic disease, other specified site, initial encounter for fracture: Secondary | ICD-10-CM | POA: Diagnosis not present

## 2019-11-13 DIAGNOSIS — F329 Major depressive disorder, single episode, unspecified: Secondary | ICD-10-CM | POA: Diagnosis not present

## 2019-11-13 DIAGNOSIS — G893 Neoplasm related pain (acute) (chronic): Secondary | ICD-10-CM | POA: Diagnosis not present

## 2019-11-13 DIAGNOSIS — Z79899 Other long term (current) drug therapy: Secondary | ICD-10-CM | POA: Diagnosis not present

## 2019-11-13 DIAGNOSIS — Z853 Personal history of malignant neoplasm of breast: Secondary | ICD-10-CM | POA: Diagnosis not present

## 2019-11-13 DIAGNOSIS — C7951 Secondary malignant neoplasm of bone: Secondary | ICD-10-CM | POA: Diagnosis not present

## 2019-11-14 DIAGNOSIS — Z853 Personal history of malignant neoplasm of breast: Secondary | ICD-10-CM | POA: Diagnosis not present

## 2019-11-14 DIAGNOSIS — C7949 Secondary malignant neoplasm of other parts of nervous system: Secondary | ICD-10-CM | POA: Diagnosis not present

## 2019-11-14 DIAGNOSIS — G893 Neoplasm related pain (acute) (chronic): Secondary | ICD-10-CM | POA: Diagnosis not present

## 2019-11-14 DIAGNOSIS — Z79891 Long term (current) use of opiate analgesic: Secondary | ICD-10-CM | POA: Diagnosis not present

## 2019-11-14 DIAGNOSIS — K5903 Drug induced constipation: Secondary | ICD-10-CM | POA: Diagnosis not present

## 2019-11-14 DIAGNOSIS — R0781 Pleurodynia: Secondary | ICD-10-CM | POA: Diagnosis not present

## 2019-11-14 DIAGNOSIS — M25552 Pain in left hip: Secondary | ICD-10-CM | POA: Diagnosis not present

## 2019-11-14 DIAGNOSIS — X58XXXA Exposure to other specified factors, initial encounter: Secondary | ICD-10-CM | POA: Diagnosis not present

## 2019-11-14 DIAGNOSIS — C50919 Malignant neoplasm of unspecified site of unspecified female breast: Secondary | ICD-10-CM | POA: Diagnosis not present

## 2019-11-14 DIAGNOSIS — C801 Malignant (primary) neoplasm, unspecified: Secondary | ICD-10-CM | POA: Diagnosis not present

## 2019-11-14 DIAGNOSIS — J9811 Atelectasis: Secondary | ICD-10-CM | POA: Diagnosis not present

## 2019-11-14 DIAGNOSIS — Z79899 Other long term (current) drug therapy: Secondary | ICD-10-CM | POA: Diagnosis not present

## 2019-11-14 DIAGNOSIS — F329 Major depressive disorder, single episode, unspecified: Secondary | ICD-10-CM | POA: Diagnosis not present

## 2019-11-14 DIAGNOSIS — Z9889 Other specified postprocedural states: Secondary | ICD-10-CM | POA: Diagnosis not present

## 2019-11-14 DIAGNOSIS — C7989 Secondary malignant neoplasm of other specified sites: Secondary | ICD-10-CM | POA: Diagnosis not present

## 2019-11-14 DIAGNOSIS — K219 Gastro-esophageal reflux disease without esophagitis: Secondary | ICD-10-CM | POA: Diagnosis not present

## 2019-11-14 DIAGNOSIS — M8458XA Pathological fracture in neoplastic disease, other specified site, initial encounter for fracture: Secondary | ICD-10-CM | POA: Diagnosis not present

## 2019-11-14 DIAGNOSIS — K5900 Constipation, unspecified: Secondary | ICD-10-CM | POA: Diagnosis not present

## 2019-11-14 DIAGNOSIS — C7951 Secondary malignant neoplasm of bone: Secondary | ICD-10-CM | POA: Diagnosis not present

## 2019-11-15 DIAGNOSIS — C7949 Secondary malignant neoplasm of other parts of nervous system: Secondary | ICD-10-CM | POA: Diagnosis not present

## 2019-11-15 DIAGNOSIS — R0902 Hypoxemia: Secondary | ICD-10-CM | POA: Diagnosis not present

## 2019-11-15 DIAGNOSIS — M8458XA Pathological fracture in neoplastic disease, other specified site, initial encounter for fracture: Secondary | ICD-10-CM | POA: Diagnosis not present

## 2019-11-15 DIAGNOSIS — Z79891 Long term (current) use of opiate analgesic: Secondary | ICD-10-CM | POA: Diagnosis not present

## 2019-11-15 DIAGNOSIS — Z853 Personal history of malignant neoplasm of breast: Secondary | ICD-10-CM | POA: Diagnosis not present

## 2019-11-15 DIAGNOSIS — K5903 Drug induced constipation: Secondary | ICD-10-CM | POA: Diagnosis not present

## 2019-11-15 DIAGNOSIS — R279 Unspecified lack of coordination: Secondary | ICD-10-CM | POA: Diagnosis not present

## 2019-11-15 DIAGNOSIS — C801 Malignant (primary) neoplasm, unspecified: Secondary | ICD-10-CM | POA: Diagnosis not present

## 2019-11-15 DIAGNOSIS — K219 Gastro-esophageal reflux disease without esophagitis: Secondary | ICD-10-CM | POA: Diagnosis not present

## 2019-11-15 DIAGNOSIS — M25552 Pain in left hip: Secondary | ICD-10-CM | POA: Diagnosis not present

## 2019-11-15 DIAGNOSIS — R0781 Pleurodynia: Secondary | ICD-10-CM | POA: Diagnosis not present

## 2019-11-15 DIAGNOSIS — C7951 Secondary malignant neoplasm of bone: Secondary | ICD-10-CM | POA: Diagnosis not present

## 2019-11-15 DIAGNOSIS — K5900 Constipation, unspecified: Secondary | ICD-10-CM | POA: Diagnosis not present

## 2019-11-15 DIAGNOSIS — G893 Neoplasm related pain (acute) (chronic): Secondary | ICD-10-CM | POA: Diagnosis not present

## 2019-11-15 DIAGNOSIS — Z743 Need for continuous supervision: Secondary | ICD-10-CM | POA: Diagnosis not present

## 2019-11-15 DIAGNOSIS — R531 Weakness: Secondary | ICD-10-CM | POA: Diagnosis not present

## 2019-11-15 DIAGNOSIS — Z79899 Other long term (current) drug therapy: Secondary | ICD-10-CM | POA: Diagnosis not present

## 2019-11-15 DIAGNOSIS — C7989 Secondary malignant neoplasm of other specified sites: Secondary | ICD-10-CM | POA: Diagnosis not present

## 2019-11-15 DIAGNOSIS — C50919 Malignant neoplasm of unspecified site of unspecified female breast: Secondary | ICD-10-CM | POA: Diagnosis not present

## 2019-11-15 DIAGNOSIS — F329 Major depressive disorder, single episode, unspecified: Secondary | ICD-10-CM | POA: Diagnosis not present

## 2019-11-15 DIAGNOSIS — X58XXXA Exposure to other specified factors, initial encounter: Secondary | ICD-10-CM | POA: Diagnosis not present

## 2019-11-19 DIAGNOSIS — Z170 Estrogen receptor positive status [ER+]: Secondary | ICD-10-CM | POA: Diagnosis not present

## 2019-11-19 DIAGNOSIS — C7951 Secondary malignant neoplasm of bone: Secondary | ICD-10-CM | POA: Diagnosis not present

## 2019-11-19 DIAGNOSIS — N951 Menopausal and female climacteric states: Secondary | ICD-10-CM | POA: Diagnosis not present

## 2019-11-19 DIAGNOSIS — Z79891 Long term (current) use of opiate analgesic: Secondary | ICD-10-CM | POA: Diagnosis not present

## 2019-11-19 DIAGNOSIS — Z9889 Other specified postprocedural states: Secondary | ICD-10-CM | POA: Diagnosis not present

## 2019-11-19 DIAGNOSIS — R112 Nausea with vomiting, unspecified: Secondary | ICD-10-CM | POA: Diagnosis not present

## 2019-11-19 DIAGNOSIS — E860 Dehydration: Secondary | ICD-10-CM | POA: Diagnosis not present

## 2019-11-19 DIAGNOSIS — Z79899 Other long term (current) drug therapy: Secondary | ICD-10-CM | POA: Diagnosis not present

## 2019-11-19 DIAGNOSIS — C50911 Malignant neoplasm of unspecified site of right female breast: Secondary | ICD-10-CM | POA: Diagnosis not present

## 2019-11-19 DIAGNOSIS — C50919 Malignant neoplasm of unspecified site of unspecified female breast: Secondary | ICD-10-CM | POA: Diagnosis not present

## 2019-11-19 DIAGNOSIS — Z9011 Acquired absence of right breast and nipple: Secondary | ICD-10-CM | POA: Diagnosis not present

## 2019-11-19 DIAGNOSIS — R519 Headache, unspecified: Secondary | ICD-10-CM | POA: Diagnosis not present

## 2019-11-20 DIAGNOSIS — R519 Headache, unspecified: Secondary | ICD-10-CM | POA: Diagnosis not present

## 2019-11-20 DIAGNOSIS — R52 Pain, unspecified: Secondary | ICD-10-CM | POA: Diagnosis not present

## 2019-11-20 DIAGNOSIS — C7951 Secondary malignant neoplasm of bone: Secondary | ICD-10-CM | POA: Diagnosis not present

## 2019-11-20 DIAGNOSIS — C50919 Malignant neoplasm of unspecified site of unspecified female breast: Secondary | ICD-10-CM | POA: Diagnosis not present

## 2019-11-21 DIAGNOSIS — C50919 Malignant neoplasm of unspecified site of unspecified female breast: Secondary | ICD-10-CM | POA: Diagnosis not present

## 2019-11-21 DIAGNOSIS — M545 Low back pain: Secondary | ICD-10-CM | POA: Diagnosis not present

## 2019-11-21 DIAGNOSIS — C7951 Secondary malignant neoplasm of bone: Secondary | ICD-10-CM | POA: Diagnosis not present

## 2019-11-21 DIAGNOSIS — M546 Pain in thoracic spine: Secondary | ICD-10-CM | POA: Diagnosis not present

## 2019-11-26 DIAGNOSIS — C7951 Secondary malignant neoplasm of bone: Secondary | ICD-10-CM | POA: Diagnosis not present

## 2019-11-26 DIAGNOSIS — C50911 Malignant neoplasm of unspecified site of right female breast: Secondary | ICD-10-CM | POA: Diagnosis not present

## 2019-11-29 DIAGNOSIS — M5136 Other intervertebral disc degeneration, lumbar region: Secondary | ICD-10-CM | POA: Diagnosis not present

## 2019-11-29 DIAGNOSIS — M8458XS Pathological fracture in neoplastic disease, other specified site, sequela: Secondary | ICD-10-CM | POA: Diagnosis not present

## 2019-11-29 DIAGNOSIS — M4134 Thoracogenic scoliosis, thoracic region: Secondary | ICD-10-CM | POA: Diagnosis not present

## 2019-11-29 DIAGNOSIS — M4696 Unspecified inflammatory spondylopathy, lumbar region: Secondary | ICD-10-CM | POA: Diagnosis not present

## 2019-11-29 DIAGNOSIS — C50919 Malignant neoplasm of unspecified site of unspecified female breast: Secondary | ICD-10-CM | POA: Diagnosis not present

## 2019-11-29 DIAGNOSIS — X58XXXS Exposure to other specified factors, sequela: Secondary | ICD-10-CM | POA: Diagnosis not present

## 2019-11-29 DIAGNOSIS — M4316 Spondylolisthesis, lumbar region: Secondary | ICD-10-CM | POA: Diagnosis not present

## 2019-11-29 DIAGNOSIS — M8458XD Pathological fracture in neoplastic disease, other specified site, subsequent encounter for fracture with routine healing: Secondary | ICD-10-CM | POA: Diagnosis not present

## 2019-11-29 DIAGNOSIS — C7951 Secondary malignant neoplasm of bone: Secondary | ICD-10-CM | POA: Diagnosis not present

## 2019-12-02 DIAGNOSIS — C7951 Secondary malignant neoplasm of bone: Secondary | ICD-10-CM | POA: Diagnosis not present

## 2019-12-03 DIAGNOSIS — M549 Dorsalgia, unspecified: Secondary | ICD-10-CM | POA: Diagnosis not present

## 2019-12-03 DIAGNOSIS — R519 Headache, unspecified: Secondary | ICD-10-CM | POA: Diagnosis not present

## 2019-12-03 DIAGNOSIS — Z9011 Acquired absence of right breast and nipple: Secondary | ICD-10-CM | POA: Diagnosis not present

## 2019-12-03 DIAGNOSIS — Z9882 Breast implant status: Secondary | ICD-10-CM | POA: Diagnosis not present

## 2019-12-03 DIAGNOSIS — Z853 Personal history of malignant neoplasm of breast: Secondary | ICD-10-CM | POA: Diagnosis not present

## 2019-12-03 DIAGNOSIS — Z79891 Long term (current) use of opiate analgesic: Secondary | ICD-10-CM | POA: Diagnosis not present

## 2019-12-03 DIAGNOSIS — C7951 Secondary malignant neoplasm of bone: Secondary | ICD-10-CM | POA: Diagnosis not present

## 2019-12-06 DIAGNOSIS — S32000A Wedge compression fracture of unspecified lumbar vertebra, initial encounter for closed fracture: Secondary | ICD-10-CM | POA: Diagnosis not present

## 2019-12-06 DIAGNOSIS — M545 Low back pain: Secondary | ICD-10-CM | POA: Diagnosis not present

## 2019-12-17 DIAGNOSIS — C7951 Secondary malignant neoplasm of bone: Secondary | ICD-10-CM | POA: Diagnosis not present

## 2019-12-17 DIAGNOSIS — Z170 Estrogen receptor positive status [ER+]: Secondary | ICD-10-CM | POA: Diagnosis not present

## 2019-12-17 DIAGNOSIS — C50919 Malignant neoplasm of unspecified site of unspecified female breast: Secondary | ICD-10-CM | POA: Diagnosis not present

## 2019-12-17 DIAGNOSIS — R519 Headache, unspecified: Secondary | ICD-10-CM | POA: Diagnosis not present

## 2019-12-26 DIAGNOSIS — C50919 Malignant neoplasm of unspecified site of unspecified female breast: Secondary | ICD-10-CM | POA: Diagnosis not present

## 2019-12-26 DIAGNOSIS — C7951 Secondary malignant neoplasm of bone: Secondary | ICD-10-CM | POA: Diagnosis not present

## 2019-12-26 DIAGNOSIS — Z515 Encounter for palliative care: Secondary | ICD-10-CM | POA: Diagnosis not present

## 2019-12-26 DIAGNOSIS — M545 Low back pain: Secondary | ICD-10-CM | POA: Diagnosis not present

## 2019-12-31 DIAGNOSIS — Z170 Estrogen receptor positive status [ER+]: Secondary | ICD-10-CM | POA: Diagnosis not present

## 2019-12-31 DIAGNOSIS — Z79899 Other long term (current) drug therapy: Secondary | ICD-10-CM | POA: Diagnosis not present

## 2019-12-31 DIAGNOSIS — C50911 Malignant neoplasm of unspecified site of right female breast: Secondary | ICD-10-CM | POA: Diagnosis not present

## 2019-12-31 DIAGNOSIS — C7951 Secondary malignant neoplasm of bone: Secondary | ICD-10-CM | POA: Diagnosis not present

## 2019-12-31 DIAGNOSIS — Z9011 Acquired absence of right breast and nipple: Secondary | ICD-10-CM | POA: Diagnosis not present

## 2019-12-31 DIAGNOSIS — Z9889 Other specified postprocedural states: Secondary | ICD-10-CM | POA: Diagnosis not present

## 2020-01-06 DIAGNOSIS — S32000A Wedge compression fracture of unspecified lumbar vertebra, initial encounter for closed fracture: Secondary | ICD-10-CM | POA: Diagnosis not present

## 2020-01-06 DIAGNOSIS — M545 Low back pain: Secondary | ICD-10-CM | POA: Diagnosis not present

## 2020-01-22 DIAGNOSIS — Z13820 Encounter for screening for osteoporosis: Secondary | ICD-10-CM | POA: Diagnosis not present

## 2020-01-22 DIAGNOSIS — Z780 Asymptomatic menopausal state: Secondary | ICD-10-CM | POA: Diagnosis not present

## 2020-01-28 DIAGNOSIS — Z170 Estrogen receptor positive status [ER+]: Secondary | ICD-10-CM | POA: Diagnosis not present

## 2020-01-28 DIAGNOSIS — C50919 Malignant neoplasm of unspecified site of unspecified female breast: Secondary | ICD-10-CM | POA: Diagnosis not present

## 2020-01-28 DIAGNOSIS — C7951 Secondary malignant neoplasm of bone: Secondary | ICD-10-CM | POA: Diagnosis not present

## 2020-01-28 DIAGNOSIS — Z9011 Acquired absence of right breast and nipple: Secondary | ICD-10-CM | POA: Diagnosis not present

## 2020-01-31 DIAGNOSIS — M8458XA Pathological fracture in neoplastic disease, other specified site, initial encounter for fracture: Secondary | ICD-10-CM | POA: Diagnosis not present

## 2020-01-31 DIAGNOSIS — Z79899 Other long term (current) drug therapy: Secondary | ICD-10-CM | POA: Diagnosis not present

## 2020-01-31 DIAGNOSIS — Z170 Estrogen receptor positive status [ER+]: Secondary | ICD-10-CM | POA: Diagnosis not present

## 2020-01-31 DIAGNOSIS — C7951 Secondary malignant neoplasm of bone: Secondary | ICD-10-CM | POA: Diagnosis not present

## 2020-01-31 DIAGNOSIS — M8458XD Pathological fracture in neoplastic disease, other specified site, subsequent encounter for fracture with routine healing: Secondary | ICD-10-CM | POA: Diagnosis not present

## 2020-01-31 DIAGNOSIS — C50911 Malignant neoplasm of unspecified site of right female breast: Secondary | ICD-10-CM | POA: Diagnosis not present

## 2020-02-06 DIAGNOSIS — S32000A Wedge compression fracture of unspecified lumbar vertebra, initial encounter for closed fracture: Secondary | ICD-10-CM | POA: Diagnosis not present

## 2020-02-06 DIAGNOSIS — M545 Low back pain: Secondary | ICD-10-CM | POA: Diagnosis not present

## 2020-02-07 DIAGNOSIS — Z23 Encounter for immunization: Secondary | ICD-10-CM | POA: Diagnosis not present

## 2020-02-13 DIAGNOSIS — M546 Pain in thoracic spine: Secondary | ICD-10-CM | POA: Diagnosis not present

## 2020-02-13 DIAGNOSIS — C50919 Malignant neoplasm of unspecified site of unspecified female breast: Secondary | ICD-10-CM | POA: Diagnosis not present

## 2020-02-13 DIAGNOSIS — F419 Anxiety disorder, unspecified: Secondary | ICD-10-CM | POA: Diagnosis not present

## 2020-02-13 DIAGNOSIS — M545 Low back pain, unspecified: Secondary | ICD-10-CM | POA: Diagnosis not present

## 2020-02-17 DIAGNOSIS — G9589 Other specified diseases of spinal cord: Secondary | ICD-10-CM | POA: Diagnosis not present

## 2020-02-17 DIAGNOSIS — Z87311 Personal history of (healed) other pathological fracture: Secondary | ICD-10-CM | POA: Diagnosis not present

## 2020-02-17 DIAGNOSIS — C7951 Secondary malignant neoplasm of bone: Secondary | ICD-10-CM | POA: Diagnosis not present

## 2020-02-17 DIAGNOSIS — E278 Other specified disorders of adrenal gland: Secondary | ICD-10-CM | POA: Diagnosis not present

## 2020-02-17 DIAGNOSIS — R918 Other nonspecific abnormal finding of lung field: Secondary | ICD-10-CM | POA: Diagnosis not present

## 2020-02-21 DIAGNOSIS — C7951 Secondary malignant neoplasm of bone: Secondary | ICD-10-CM | POA: Diagnosis not present

## 2020-02-21 DIAGNOSIS — M8458XD Pathological fracture in neoplastic disease, other specified site, subsequent encounter for fracture with routine healing: Secondary | ICD-10-CM | POA: Diagnosis not present

## 2020-02-21 DIAGNOSIS — C50919 Malignant neoplasm of unspecified site of unspecified female breast: Secondary | ICD-10-CM | POA: Diagnosis not present

## 2020-02-25 DIAGNOSIS — Z23 Encounter for immunization: Secondary | ICD-10-CM | POA: Diagnosis not present

## 2020-02-25 DIAGNOSIS — C7951 Secondary malignant neoplasm of bone: Secondary | ICD-10-CM | POA: Diagnosis not present

## 2020-02-25 DIAGNOSIS — Z170 Estrogen receptor positive status [ER+]: Secondary | ICD-10-CM | POA: Diagnosis not present

## 2020-02-25 DIAGNOSIS — C50911 Malignant neoplasm of unspecified site of right female breast: Secondary | ICD-10-CM | POA: Diagnosis not present

## 2020-02-25 DIAGNOSIS — K5900 Constipation, unspecified: Secondary | ICD-10-CM | POA: Diagnosis not present

## 2020-03-07 DIAGNOSIS — M545 Low back pain, unspecified: Secondary | ICD-10-CM | POA: Diagnosis not present

## 2020-03-07 DIAGNOSIS — S32000A Wedge compression fracture of unspecified lumbar vertebra, initial encounter for closed fracture: Secondary | ICD-10-CM | POA: Diagnosis not present

## 2020-03-09 DIAGNOSIS — T8543XA Leakage of breast prosthesis and implant, initial encounter: Secondary | ICD-10-CM | POA: Diagnosis not present

## 2020-03-09 DIAGNOSIS — C7951 Secondary malignant neoplasm of bone: Secondary | ICD-10-CM | POA: Diagnosis not present

## 2020-03-10 DIAGNOSIS — Z170 Estrogen receptor positive status [ER+]: Secondary | ICD-10-CM | POA: Diagnosis not present

## 2020-03-10 DIAGNOSIS — R519 Headache, unspecified: Secondary | ICD-10-CM | POA: Diagnosis not present

## 2020-03-10 DIAGNOSIS — C50919 Malignant neoplasm of unspecified site of unspecified female breast: Secondary | ICD-10-CM | POA: Diagnosis not present

## 2020-03-10 DIAGNOSIS — C7951 Secondary malignant neoplasm of bone: Secondary | ICD-10-CM | POA: Diagnosis not present

## 2020-03-10 DIAGNOSIS — G8929 Other chronic pain: Secondary | ICD-10-CM | POA: Diagnosis not present

## 2020-03-10 DIAGNOSIS — Z79899 Other long term (current) drug therapy: Secondary | ICD-10-CM | POA: Diagnosis not present

## 2020-03-11 ENCOUNTER — Telehealth: Payer: Self-pay | Admitting: Internal Medicine

## 2020-03-11 DIAGNOSIS — C50919 Malignant neoplasm of unspecified site of unspecified female breast: Secondary | ICD-10-CM

## 2020-03-11 NOTE — Telephone Encounter (Signed)
Ok referral done to Dr Tawana Scale for pt to sign medical release form, but I am not sure she really has to, since these records are available to Dr Marin Olp under [place]

## 2020-03-11 NOTE — Telephone Encounter (Signed)
Sent to Dr. John to advise. 

## 2020-03-11 NOTE — Telephone Encounter (Signed)
Patient got recently diagnosed with Metastatic Breast Cancer and she wanted to get referred to Dr. Burney Gauze to take over her care for cancer because the one she got diagnosed at is not near by.  Patient also wanted to get her medical records from [facility] transferred over to Dr. Judi Cong office and they said we would have to request those. [facility]- Fax# [phone]

## 2020-03-12 ENCOUNTER — Encounter: Payer: Self-pay | Admitting: *Deleted

## 2020-03-12 DIAGNOSIS — M545 Low back pain, unspecified: Secondary | ICD-10-CM | POA: Diagnosis not present

## 2020-03-12 DIAGNOSIS — C7951 Secondary malignant neoplasm of bone: Secondary | ICD-10-CM | POA: Diagnosis not present

## 2020-03-12 DIAGNOSIS — C50919 Malignant neoplasm of unspecified site of unspecified female breast: Secondary | ICD-10-CM | POA: Diagnosis not present

## 2020-03-12 DIAGNOSIS — Z515 Encounter for palliative care: Secondary | ICD-10-CM | POA: Diagnosis not present

## 2020-03-12 NOTE — Progress Notes (Signed)
Reached out to Caitlin Greene to introduce myself as the office RN Navigator and explain our new patient process. Reviewed the reason for their referral and scheduled their new patient appointment along with labs. Provided address and directions to the office including call back phone number. Reviewed with patient any concerns they may have or any possible barriers to attending their appointment.   Informed patient about my role as a navigator and that I will meet with them prior to their New Patient appointment and more fully discuss what services I can provide. At this time patient has no further questions or needs.   Oncology Nurse Navigator Documentation  Oncology Nurse Navigator Flowsheets 03/12/2020  Navigator Follow Up Date: 03/23/2020  Navigator Follow Up Reason: New Patient Appointment  Navigator Location [facility]  Referral Date to RadOnc/MedOnc 03/12/2020  Navigator Encounter Type Introductory Phone Call  Patient Visit Type MedOnc  Treatment Phase Active Tx  Barriers/Navigation Needs Coordination of Care;Education  Education Other  Interventions Coordination of Care;Education  Acuity Level 2-Minimal Needs (1-2 Barriers Identified)  Coordination of Care Appts  Education Method Verbal  Support Groups/Services Friends and Family  Time Spent with Patient 39

## 2020-03-19 ENCOUNTER — Inpatient Hospital Stay (HOSPITAL_COMMUNITY)
Admission: EM | Admit: 2020-03-19 | Discharge: 2020-03-24 | DRG: 641 | Disposition: A | Discharge status: Home / Self Care | Payer: BC Managed Care – PPO | Attending: Internal Medicine | Admitting: Internal Medicine

## 2020-03-19 ENCOUNTER — Encounter (HOSPITAL_COMMUNITY): Payer: Self-pay | Admitting: Radiology

## 2020-03-19 ENCOUNTER — Other Ambulatory Visit: Payer: Self-pay

## 2020-03-19 ENCOUNTER — Telehealth: Payer: Self-pay | Admitting: *Deleted

## 2020-03-19 ENCOUNTER — Emergency Department (HOSPITAL_COMMUNITY): Payer: BC Managed Care – PPO

## 2020-03-19 DIAGNOSIS — Z79899 Other long term (current) drug therapy: Secondary | ICD-10-CM

## 2020-03-19 DIAGNOSIS — R531 Weakness: Secondary | ICD-10-CM

## 2020-03-19 DIAGNOSIS — D539 Nutritional anemia, unspecified: Secondary | ICD-10-CM | POA: Diagnosis present

## 2020-03-19 DIAGNOSIS — D638 Anemia in other chronic diseases classified elsewhere: Secondary | ICD-10-CM | POA: Diagnosis present

## 2020-03-19 DIAGNOSIS — R112 Nausea with vomiting, unspecified: Secondary | ICD-10-CM | POA: Diagnosis not present

## 2020-03-19 DIAGNOSIS — K2950 Unspecified chronic gastritis without bleeding: Secondary | ICD-10-CM | POA: Diagnosis not present

## 2020-03-19 DIAGNOSIS — T451X5A Adverse effect of antineoplastic and immunosuppressive drugs, initial encounter: Secondary | ICD-10-CM | POA: Diagnosis present

## 2020-03-19 DIAGNOSIS — K3189 Other diseases of stomach and duodenum: Secondary | ICD-10-CM | POA: Diagnosis not present

## 2020-03-19 DIAGNOSIS — F419 Anxiety disorder, unspecified: Secondary | ICD-10-CM | POA: Diagnosis present

## 2020-03-19 DIAGNOSIS — Z8249 Family history of ischemic heart disease and other diseases of the circulatory system: Secondary | ICD-10-CM | POA: Diagnosis not present

## 2020-03-19 DIAGNOSIS — R9431 Abnormal electrocardiogram [ECG] [EKG]: Secondary | ICD-10-CM | POA: Diagnosis not present

## 2020-03-19 DIAGNOSIS — R0902 Hypoxemia: Secondary | ICD-10-CM | POA: Diagnosis not present

## 2020-03-19 DIAGNOSIS — Z853 Personal history of malignant neoplasm of breast: Secondary | ICD-10-CM

## 2020-03-19 DIAGNOSIS — K449 Diaphragmatic hernia without obstruction or gangrene: Secondary | ICD-10-CM | POA: Diagnosis present

## 2020-03-19 DIAGNOSIS — Z20822 Contact with and (suspected) exposure to covid-19: Secondary | ICD-10-CM | POA: Diagnosis not present

## 2020-03-19 DIAGNOSIS — Z8371 Family history of colonic polyps: Secondary | ICD-10-CM | POA: Diagnosis not present

## 2020-03-19 DIAGNOSIS — N200 Calculus of kidney: Secondary | ICD-10-CM

## 2020-03-19 DIAGNOSIS — J0100 Acute maxillary sinusitis, unspecified: Secondary | ICD-10-CM

## 2020-03-19 DIAGNOSIS — K5900 Constipation, unspecified: Secondary | ICD-10-CM

## 2020-03-19 DIAGNOSIS — E785 Hyperlipidemia, unspecified: Secondary | ICD-10-CM | POA: Diagnosis present

## 2020-03-19 DIAGNOSIS — F502 Bulimia nervosa, unspecified: Secondary | ICD-10-CM

## 2020-03-19 DIAGNOSIS — C50919 Malignant neoplasm of unspecified site of unspecified female breast: Secondary | ICD-10-CM

## 2020-03-19 DIAGNOSIS — J90 Pleural effusion, not elsewhere classified: Secondary | ICD-10-CM | POA: Diagnosis not present

## 2020-03-19 DIAGNOSIS — R1310 Dysphagia, unspecified: Secondary | ICD-10-CM

## 2020-03-19 DIAGNOSIS — R0981 Nasal congestion: Secondary | ICD-10-CM | POA: Diagnosis present

## 2020-03-19 DIAGNOSIS — G893 Neoplasm related pain (acute) (chronic): Secondary | ICD-10-CM

## 2020-03-19 DIAGNOSIS — C7951 Secondary malignant neoplasm of bone: Secondary | ICD-10-CM | POA: Diagnosis present

## 2020-03-19 DIAGNOSIS — Z9011 Acquired absence of right breast and nipple: Secondary | ICD-10-CM

## 2020-03-19 DIAGNOSIS — K921 Melena: Secondary | ICD-10-CM

## 2020-03-19 DIAGNOSIS — Z881 Allergy status to other antibiotic agents status: Secondary | ICD-10-CM

## 2020-03-19 DIAGNOSIS — R0602 Shortness of breath: Secondary | ICD-10-CM | POA: Diagnosis not present

## 2020-03-19 DIAGNOSIS — D510 Vitamin B12 deficiency anemia due to intrinsic factor deficiency: Secondary | ICD-10-CM

## 2020-03-19 DIAGNOSIS — Q399 Congenital malformation of esophagus, unspecified: Secondary | ICD-10-CM | POA: Diagnosis not present

## 2020-03-19 DIAGNOSIS — Z170 Estrogen receptor positive status [ER+]: Secondary | ICD-10-CM

## 2020-03-19 DIAGNOSIS — Z825 Family history of asthma and other chronic lower respiratory diseases: Secondary | ICD-10-CM

## 2020-03-19 DIAGNOSIS — K5909 Other constipation: Secondary | ICD-10-CM | POA: Diagnosis not present

## 2020-03-19 DIAGNOSIS — E860 Dehydration: Principal | ICD-10-CM | POA: Diagnosis present

## 2020-03-19 DIAGNOSIS — J9811 Atelectasis: Secondary | ICD-10-CM | POA: Diagnosis not present

## 2020-03-19 DIAGNOSIS — R1113 Vomiting of fecal matter: Secondary | ICD-10-CM | POA: Diagnosis not present

## 2020-03-19 DIAGNOSIS — Z885 Allergy status to narcotic agent status: Secondary | ICD-10-CM

## 2020-03-19 DIAGNOSIS — Z66 Do not resuscitate: Secondary | ICD-10-CM | POA: Diagnosis present

## 2020-03-19 DIAGNOSIS — Z79811 Long term (current) use of aromatase inhibitors: Secondary | ICD-10-CM

## 2020-03-19 DIAGNOSIS — K200 Eosinophilic esophagitis: Secondary | ICD-10-CM | POA: Diagnosis not present

## 2020-03-19 DIAGNOSIS — D701 Agranulocytosis secondary to cancer chemotherapy: Secondary | ICD-10-CM | POA: Diagnosis present

## 2020-03-19 DIAGNOSIS — F32A Depression, unspecified: Secondary | ICD-10-CM | POA: Diagnosis present

## 2020-03-19 DIAGNOSIS — K219 Gastro-esophageal reflux disease without esophagitis: Secondary | ICD-10-CM | POA: Diagnosis not present

## 2020-03-19 DIAGNOSIS — E876 Hypokalemia: Secondary | ICD-10-CM | POA: Diagnosis not present

## 2020-03-19 DIAGNOSIS — C7800 Secondary malignant neoplasm of unspecified lung: Secondary | ICD-10-CM | POA: Diagnosis not present

## 2020-03-19 DIAGNOSIS — K317 Polyp of stomach and duodenum: Secondary | ICD-10-CM | POA: Diagnosis not present

## 2020-03-19 DIAGNOSIS — J343 Hypertrophy of nasal turbinates: Secondary | ICD-10-CM

## 2020-03-19 DIAGNOSIS — I2699 Other pulmonary embolism without acute cor pulmonale: Secondary | ICD-10-CM | POA: Diagnosis not present

## 2020-03-19 DIAGNOSIS — R001 Bradycardia, unspecified: Secondary | ICD-10-CM | POA: Diagnosis not present

## 2020-03-19 DIAGNOSIS — D500 Iron deficiency anemia secondary to blood loss (chronic): Secondary | ICD-10-CM

## 2020-03-19 LAB — CBC WITH DIFFERENTIAL/PLATELET
Abs Immature Granulocytes: 0.01 10*3/uL (ref 0.00–0.07)
Basophils Absolute: 0.1 10*3/uL (ref 0.0–0.1)
Basophils Relative: 3 %
Eosinophils Absolute: 0 10*3/uL (ref 0.0–0.5)
Eosinophils Relative: 1 %
HCT: 37.2 % (ref 36.0–46.0)
Hemoglobin: 13.3 g/dL (ref 12.0–15.0)
Immature Granulocytes: 0 %
Lymphocytes Relative: 40 %
Lymphs Abs: 1 10*3/uL (ref 0.7–4.0)
MCH: 35.5 pg — ABNORMAL HIGH (ref 26.0–34.0)
MCHC: 35.8 g/dL (ref 30.0–36.0)
MCV: 99.2 fL (ref 80.0–100.0)
Monocytes Absolute: 0.1 10*3/uL (ref 0.1–1.0)
Monocytes Relative: 5 %
Neutro Abs: 1.2 10*3/uL — ABNORMAL LOW (ref 1.7–7.7)
Neutrophils Relative %: 51 %
Platelets: 206 10*3/uL (ref 150–400)
RBC: 3.75 MIL/uL — ABNORMAL LOW (ref 3.87–5.11)
RDW: 19.7 % — ABNORMAL HIGH (ref 11.5–15.5)
WBC: 2.4 10*3/uL — ABNORMAL LOW (ref 4.0–10.5)
nRBC: 0 % (ref 0.0–0.2)

## 2020-03-19 LAB — COMPREHENSIVE METABOLIC PANEL
ALT: 23 U/L (ref 0–44)
AST: 24 U/L (ref 15–41)
Albumin: 4.4 g/dL (ref 3.5–5.0)
Alkaline Phosphatase: 46 U/L (ref 38–126)
Anion gap: 12 (ref 5–15)
BUN: 14 mg/dL (ref 6–20)
CO2: 19 mmol/L — ABNORMAL LOW (ref 22–32)
Calcium: 8.9 mg/dL (ref 8.9–10.3)
Chloride: 105 mmol/L (ref 98–111)
Creatinine, Ser: 0.79 mg/dL (ref 0.44–1.00)
GFR, Estimated: >60 mL/min (ref >60)
Glucose, Bld: 103 mg/dL — ABNORMAL HIGH (ref 70–99)
Potassium: 3.4 mmol/L — ABNORMAL LOW (ref 3.5–5.1)
Sodium: 136 mmol/L (ref 135–145)
Total Bilirubin: 1.2 mg/dL (ref 0.3–1.2)
Total Protein: 8 g/dL (ref 6.5–8.1)

## 2020-03-19 LAB — RESPIRATORY PANEL BY RT PCR (FLU A&B, COVID)
Influenza A by PCR: NEGATIVE
Influenza B by PCR: NEGATIVE
SARS Coronavirus 2 by RT PCR: NEGATIVE

## 2020-03-19 LAB — BRAIN NATRIURETIC PEPTIDE: B Natriuretic Peptide: 19.8 pg/mL (ref 0.0–100.0)

## 2020-03-19 LAB — TROPONIN I (HIGH SENSITIVITY): Troponin I (High Sensitivity): <2 ng/L (ref <18)

## 2020-03-19 MED ORDER — SODIUM CHLORIDE 0.9 % IV SOLN: INTRAVENOUS | Status: DC

## 2020-03-19 MED ORDER — HYDROXYZINE HCL 25 MG PO TABS: 25.0000 mg | ORAL_TABLET | Freq: Three times a day (TID) | ORAL | Status: DC | PRN

## 2020-03-19 MED ORDER — SODIUM CHLORIDE (PF) 0.9 % IJ SOLN
INTRAMUSCULAR | Status: AC
  Filled 2020-03-19: qty 50

## 2020-03-19 MED ORDER — PROMETHAZINE HCL 25 MG/ML IJ SOLN
12.5000 mg | Freq: Once | INTRAMUSCULAR | Status: AC
  Administered 2020-03-19: 12.5 mg via INTRAVENOUS
  Filled 2020-03-19: qty 1

## 2020-03-19 MED ORDER — PANTOPRAZOLE SODIUM 40 MG PO TBEC
40.0000 mg | DELAYED_RELEASE_TABLET | Freq: Every evening | ORAL | Status: DC
  Administered 2020-03-19 – 2020-03-23 (×5): 40 mg via ORAL
  Filled 2020-03-19 (×5): qty 1

## 2020-03-19 MED ORDER — SODIUM CHLORIDE 0.9% FLUSH
3.0000 mL | Freq: Two times a day (BID) | INTRAVENOUS | Status: DC
  Administered 2020-03-20 – 2020-03-23 (×4): 3 mL via INTRAVENOUS

## 2020-03-19 MED ORDER — ENOXAPARIN SODIUM 40 MG/0.4ML ~~LOC~~ SOLN
40.0000 mg | SUBCUTANEOUS | Status: DC
  Administered 2020-03-19 – 2020-03-20 (×2): 40 mg via SUBCUTANEOUS
  Filled 2020-03-19 (×2): qty 0.4

## 2020-03-19 MED ORDER — ACETAMINOPHEN 325 MG PO TABS
650.0000 mg | ORAL_TABLET | Freq: Four times a day (QID) | ORAL | Status: DC | PRN
  Administered 2020-03-22: 650 mg via ORAL
  Filled 2020-03-19: qty 2

## 2020-03-19 MED ORDER — ONDANSETRON HCL 4 MG/2ML IJ SOLN
4.0000 mg | Freq: Once | INTRAMUSCULAR | Status: AC
  Administered 2020-03-19: 4 mg via INTRAVENOUS
  Filled 2020-03-19: qty 2

## 2020-03-19 MED ORDER — OXYCODONE HCL ER 10 MG PO T12A: 10.0000 mg | EXTENDED_RELEASE_TABLET | ORAL | Status: DC

## 2020-03-19 MED ORDER — ONDANSETRON HCL 4 MG PO TABS
4.0000 mg | ORAL_TABLET | Freq: Four times a day (QID) | ORAL | Status: DC | PRN
  Administered 2020-03-24: 4 mg via ORAL
  Filled 2020-03-19: qty 1

## 2020-03-19 MED ORDER — HYDROMORPHONE HCL 1 MG/ML IJ SOLN
0.5000 mg | INTRAMUSCULAR | Status: DC | PRN
  Administered 2020-03-20 – 2020-03-21 (×2): 0.5 mg via INTRAVENOUS
  Filled 2020-03-19: qty 0.5
  Filled 2020-03-19: qty 1

## 2020-03-19 MED ORDER — METHADONE HCL 5 MG PO TABS
5.0000 mg | ORAL_TABLET | Freq: Every day | ORAL | Status: DC
  Administered 2020-03-19 – 2020-03-23 (×5): 5 mg via ORAL
  Filled 2020-03-19 (×5): qty 1

## 2020-03-19 MED ORDER — IOHEXOL 350 MG/ML SOLN
100.0000 mL | Freq: Once | INTRAVENOUS | Status: AC | PRN
  Administered 2020-03-19: 100 mL via INTRAVENOUS

## 2020-03-19 MED ORDER — OXYCODONE HCL ER 10 MG PO T12A
10.0000 mg | EXTENDED_RELEASE_TABLET | Freq: Every day | ORAL | Status: DC
  Administered 2020-03-20 – 2020-03-24 (×5): 10 mg via ORAL
  Filled 2020-03-19 (×5): qty 1

## 2020-03-19 MED ORDER — ACETAMINOPHEN 650 MG RE SUPP: 650.0000 mg | Freq: Four times a day (QID) | RECTAL | Status: DC | PRN

## 2020-03-19 MED ORDER — ONDANSETRON HCL 4 MG/2ML IJ SOLN
4.0000 mg | Freq: Four times a day (QID) | INTRAMUSCULAR | Status: DC | PRN
  Administered 2020-03-20 – 2020-03-22 (×3): 4 mg via INTRAVENOUS
  Filled 2020-03-19 (×3): qty 2

## 2020-03-19 MED ORDER — POLYETHYLENE GLYCOL 3350 17 G PO PACK: 17.0000 g | PACK | Freq: Every day | ORAL | Status: DC | PRN

## 2020-03-19 MED ORDER — SODIUM CHLORIDE 0.9 % IV BOLUS
500.0000 mL | Freq: Once | INTRAVENOUS | Status: AC
  Administered 2020-03-19: 500 mL via INTRAVENOUS

## 2020-03-19 MED ORDER — SENNA 8.6 MG PO TABS
1.0000 | ORAL_TABLET | Freq: Two times a day (BID) | ORAL | Status: DC
  Administered 2020-03-19 – 2020-03-20 (×2): 8.6 mg via ORAL
  Filled 2020-03-19 (×2): qty 1

## 2020-03-19 MED ORDER — LACTATED RINGERS IV SOLN: INTRAVENOUS | Status: AC

## 2020-03-19 MED ORDER — OXYCODONE HCL ER 20 MG PO T12A
20.0000 mg | EXTENDED_RELEASE_TABLET | Freq: Every evening | ORAL | Status: DC
  Administered 2020-03-19 – 2020-03-23 (×5): 20 mg via ORAL
  Filled 2020-03-19: qty 2
  Filled 2020-03-19: qty 1
  Filled 2020-03-19: qty 2
  Filled 2020-03-19 (×2): qty 1

## 2020-03-19 NOTE — ED Notes (Signed)
Ambulated pt in room.O2 sats  Remained above 95% while walking. Pt did c/o dizziness and nausea.

## 2020-03-19 NOTE — ED Notes (Signed)
Patient attached to [facility]

## 2020-03-19 NOTE — ED Notes (Signed)
Dr. Dykstra at bedside.  

## 2020-03-19 NOTE — ED Notes (Signed)
Resting comfortably with sister at bedside. Feels better after fluids, still c/o a little nausea.

## 2020-03-19 NOTE — ED Notes (Signed)
Dinner tray provided to pt

## 2020-03-19 NOTE — ED Triage Notes (Signed)
Pt came from home via EMS. Pt reports generalized weakness for the past 2 wks, worsened today so EMS was called. Fire dept reported O2 86% on RA. EMS placed pt on 2L [place], weaned her off to RA, O2 at 100% on RA. PMH of stage 4 bone cancer in hips, head, back, and face. A&O x 4. Lives with sister.  CBG: 119

## 2020-03-19 NOTE — H&P (Signed)
History and Physical        Hospital Admission Note Date: 03/19/2020  Patient name: Caitlin Greene Medical record number: [id number] Date of birth: 05/09/1966 Age: 54 y.o. Gender: female  PCP: Biagio Borg, MD  Patient coming from: Home Lives with: Sister At baseline, ambulates: Independently  Chief Complaint    Chief Complaint  Patient presents with  . Weakness  . Nausea      HPI:   This is a 54 year old female with a past medical history of DCIS diagnosed 30 years prior s/p right mastectomy recently found to have metastases to bone and possibly lung on anastrozole and Ibrance follows at [facility], GERD, chronic vertigo, anxiety and depression, hyperlipidemia who presented to the ED with concern for generalized weakness, nausea, vomiting and shortness of breath for the past couple weeks. Currently she follows at [facility] for her cancer treatment but she is trying to transfer care to Dr. Marin Olp as she lives in [place].  States that she has been having nausea and vomiting for the past 10 days at least with difficulty keeping any food down.  Also with some discomfort but not pain while eating.  No fever, chills but did have sweats this morning.  Has been taking her pain medications as prescribed.  States that her symptoms have improved since being in the ED and does not have any shortness of breath currently.  ED Course: Afebrile, initially tachypneic which resolved, hemodynamically stable and tolerating room air. Notable labs: K3.4, CO2 19, BNP 19, WBC 2.4, COVID-19 negative. CXR with mild bibasilar subsegmental atelectasis or infiltrate. CTA chest negative for PE and noted atelectasis and known metastatic disease. CT head with known suspected bony metastatic static disease. She was given Zofran, Phenergan and IV fluids.  Vitals:   03/19/20 1530 03/19/20 1600  BP: 116/88  109/76  Pulse: 86 72  Resp: 18 16  Temp:    SpO2: 98% 98%     Review of Systems:  Review of Systems  Constitutional: Positive for diaphoresis. Negative for chills and fever.  Cardiovascular: Negative for chest pain and leg swelling.  Gastrointestinal: Positive for constipation, nausea and vomiting. Negative for abdominal pain.  All other systems reviewed and are negative.   Medical/Social/Family History   Past Medical History: Past Medical History:  Diagnosis Date  . ALLERGIC RHINITIS   . ANEMIA-IRON DEFICIENCY   . ANXIETY   . BREAST CANCER, HX OF 01/21/2007   at 54yo  . DEPRESSION   . GERD   . HYPERLIPIDEMIA   . RENAL CALCULUS     Past Surgical History:  Procedure Laterality Date  . BREAST ENHANCEMENT SURGERY    . MASTECTOMY     right  . TEMPOROMANDIBULAR JOINT SURGERY     Left  . TONSILLECTOMY      Medications: Prior to Admission medications   Medication Sig Start Date End Date Taking? Authorizing Provider  acetaminophen (TYLENOL) 650 MG CR tablet Take 650 mg by mouth every 8 (eight) hours as needed for pain.   Yes [provider]  anastrozole (ARIMIDEX) 1 MG tablet Take 1 mg by mouth daily. 02/24/20  Yes [provider]  CALCIUM PO Take 1 tablet by mouth daily.  Yes [provider]  FLUoxetine (PROZAC) 20 MG capsule TAKE 1 CAPSULE BY MOUTH EVERY DAY 09/19/19  Yes Biagio Borg, MD  FLUoxetine (PROZAC) 40 MG capsule Take 40 mg by mouth daily. 03/10/20  Yes [provider]  hydrOXYzine (ATARAX/VISTARIL) 25 MG tablet Take 25 mg by mouth 3 (three) times daily as needed for anxiety. 12/31/19  Yes [provider]  IBRANCE 100 MG tablet Take 100 mg by mouth See admin instructions. 100mg  daily on day 1-21 of cycle. 7 days off.  Repeat. 03/13/20  Yes [provider]  methadone (DOLOPHINE) 5 MG tablet Take 5 mg by mouth at bedtime. 02/19/20  Yes [provider]  omeprazole (PRILOSEC) 20 MG capsule Take 20 mg by  mouth daily. 01/28/20  Yes [provider]  ondansetron (ZOFRAN) 4 MG tablet Take 4 mg by mouth every 4 (four) hours as needed for nausea/vomiting. 02/13/20  Yes [provider]  oxyCODONE (OXYCONTIN) 20 mg 12 hr tablet Take 10-20 mg by mouth See admin instructions. 10mg  in the morning 20mg  at night   Yes [provider]  SM SENNA LAXATIVE 8.6 MG tablet Take 8.6 mg by mouth daily. 10/30/19  Yes [provider]  cyclobenzaprine (FLEXERIL) 10 MG tablet Take 1 tablet (10 mg total) by mouth 3 (three) times daily as needed for muscle spasms. Patient not taking: Reported on 03/19/2020 10/22/19   Kandra Nicolas, MD  oxyCODONE (OXY IR/ROXICODONE) 5 MG immediate release tablet Take 5 mg by mouth every 4 (four) hours as needed for pain. 03/12/20   [provider]    Allergies:   Allergies  Allergen Reactions  . Amoxicillin-Pot Clavulanate Hives  . Codeine Nausea And Vomiting    Needs pre-meds Needs pre-meds    Social History:  reports that she has never smoked. She has never used smokeless tobacco. She reports that she does not drink alcohol and does not use drugs.  Family History: Family History  Problem Relation Age of Onset  . Colon polyps Mother   . Cancer Mother        Uterine Cancer  . Hypertension Mother   . Dementia Mother   . Colon polyps Father   . COPD Father        smoked  . Hypertension Other   . Diabetes Other   . Asthma Sister      Objective   Physical Exam: Blood pressure 109/76, pulse 72, temperature 97.6 F (36.4 C), temperature source Oral, resp. rate 16, height 5\' 8"  (1.727 m), weight 93.4 kg, last menstrual period 06/09/2017, SpO2 98 %.  Physical Exam Vitals and nursing note reviewed. Exam conducted with a chaperone present.  Constitutional:      Comments: Chronically ill-appearing  HENT:     Head: Normocephalic.     Mouth/Throat:     Mouth: Mucous membranes are moist.     Pharynx: No oropharyngeal exudate.    Eyes:     Extraocular Movements: Extraocular movements intact.  Cardiovascular:     Rate and Rhythm: Normal rate and regular rhythm.  Pulmonary:     Effort: Pulmonary effort is normal.     Breath sounds: Normal breath sounds.  Abdominal:     General: Abdomen is flat.     Palpations: Abdomen is soft.  Musculoskeletal:        General: No swelling or tenderness.     Cervical back: Normal range of motion.  Neurological:     Mental Status: She is alert. Mental  status is at baseline.  Psychiatric:        Mood and Affect: Mood normal.        Behavior: Behavior normal.     LABS on Admission: I have personally reviewed all the labs and imaging below    Basic Metabolic Panel: Recent Labs  Lab 03/19/20 0910  NA 136  K 3.4*  CL 105  CO2 19*  GLUCOSE 103*  BUN 14  CREATININE 0.79  CALCIUM 8.9   Liver Function Tests: Recent Labs  Lab 03/19/20 0910  AST 24  ALT 23  ALKPHOS 46  BILITOT 1.2  PROT 8.0  ALBUMIN 4.4   No results for input(s): LIPASE, AMYLASE in the last 168 hours. No results for input(s): AMMONIA in the last 168 hours. CBC: Recent Labs  Lab 03/19/20 0819  WBC 2.4*  NEUTROABS 1.2*  HGB 13.3  HCT 37.2  MCV 99.2  PLT 206   Cardiac Enzymes: No results for input(s): CKTOTAL, CKMB, CKMBINDEX, TROPONINI in the last 168 hours. BNP: Invalid input(s): POCBNP CBG: No results for input(s): GLUCAP in the last 168 hours.  Radiological Exams on Admission:  DG Chest 2 View  Result Date: 03/19/2020 CLINICAL DATA:  Shortness of breath. EXAM: CHEST - 2 VIEW COMPARISON:  February 25, 2020. FINDINGS: The heart size and mediastinal contours are within normal limits. No pneumothorax or pleural effusion is noted. Mild bibasilar subsegmental atelectasis or infiltrates are noted. Small sliding-type hiatal hernia is noted. Status post kyphoplasty at 2 levels within the thoracic spine. IMPRESSION: Mild bibasilar subsegmental atelectasis or infiltrates are noted.  Electronically Signed   By: Marijo Conception M.D.   On: 03/19/2020 09:14   CT Head W or Wo Contrast  Result Date: 03/19/2020 CLINICAL DATA:  History of skull base metastatic disease. Generalized weakness over the last 2 weeks. Metastatic breast cancer. EXAM: CT HEAD WITHOUT AND WITH CONTRAST TECHNIQUE: Contiguous axial images were obtained from the base of the skull through the vertex without and with intravenous contrast CONTRAST:  176mL OMNIPAQUE IOHEXOL 350 MG/ML SOLN COMPARISON:  Cervical MRI 10/24/2019. Bone scan 02/17/2020. Head CT 12/02/2019 FINDINGS: Brain: The brain has normal appearance without evidence of atrophy, old or recent infarction, mass lesion, hemorrhage, hydrocephalus or extra-axial collection. No abnormal contrast enhancement occurs. Vascular: No abnormal vascular finding. Skull: 13 x 9 x 9 mm sclerotic focus within the right side of the clivus without evidence lytic bone destruction or extraosseous tumor. This could be related to bony metastatic disease or could be a focus of fibrous dysplasia. No second regional lesion is identified. Sinuses/Orbits: Clear sinuses as seen. Limited orbital visualization is negative. Other: None IMPRESSION: 1. 13 x 9 x 9 mm sclerotic focus within the right side of the clivus without evidence of lytic bone destruction or extraosseous tumor. This could be related to bony metastatic disease or could be a focus of fibrous dysplasia. No second regional lesion is identified. 2. Normal appearance of the brain itself. Electronically Signed   By: Nelson Chimes M.D.   On: 03/19/2020 11:40   CT Angio Chest PE W and/or Wo Contrast  Result Date: 03/19/2020 CLINICAL DATA:  High probability for pulmonary embolism. Shortness of breath EXAM: CT ANGIOGRAPHY CHEST WITH CONTRAST TECHNIQUE: Multidetector CT imaging of the chest was performed using the standard protocol during bolus administration of intravenous contrast. Multiplanar CT image reconstructions and MIPs were  obtained to evaluate the vascular anatomy. CONTRAST:  129mL OMNIPAQUE IOHEXOL 350 MG/ML SOLN COMPARISON:  06/04/2014 FINDINGS: Cardiovascular:  Normal heart size. No pericardial effusion. No pulmonary artery filling defects. Normal aorta Mediastinum/Nodes: Large sliding hiatal hernia. Lungs/Pleura: Dependent streaky opacity consistent with atelectasis. There is no edema, consolidation, effusion, or pneumothorax. Upper Abdomen: No acute finding Musculoskeletal: No acute finding. T6 and T10 compression fracture with advanced height loss at T10. Both of these levels have undergone cement augmentation. Multifocal vertebral metastatic disease with similar extent when compared to a thoracic MRI October 24, 2019. No evidence of acute fracture. Other: Intra and extracapsular breast implant rupture on the right, chronic when compared to 2016. Review of the MIP images confirms the above findings. IMPRESSION: 1. Negative for pulmonary embolism. 2. Low volume chest with atelectasis at the bases. 3. Known osseous metastatic disease.  No acute fracture. 4. Large sliding hiatal hernia. Electronically Signed   By: Monte Fantasia M.D.   On: 03/19/2020 11:45      EKG: Independently reviewed.  Borderline QT prolongation   A & P   Principal Problem:   Nausea & vomiting Active Problems:   Generalized weakness   Metastatic breast cancer (HCC)   Cancer related pain   Prolonged QT interval   Dysphagia   1. Generalized weakness, nausea and vomiting of unknown etiology a. Could be secondary to her medications as anastrozole and Ibrance both cause nausea and vomiting vs. Malignancy vs underlying GI issue vs. Other b. Continue IV fluids c. will hold anastrozole and Ibrance for today d. Continue Protonix e. PT eval f. Clear liquid diet and advance as tolerated g. Nutrition consult h. Consider GI consult if no improvement and still with dysphagia  2. Borderline prolonged QT a. QTc 491 ms b. She is on multiple QT  prolonging agents including c. Will limit her QT prolonging agents to those necessary  3. Metastatic breast cancer a. Holding Anastrazole and [place] as above b. Oncology Consult  4. Cancer related pain a. Continue home opiates with bowel regimen b. Monitor QT while on Methadone c. Palliative care consult  5. Dysphagia a. Not with odynophagia  b. Leading to poor PO intake c. Unclear etiology, no sign of candida on oral exam but could not see throat d. Nutrition consult e. SLP eval f. Consider GI consult if no improvement and still with dysphagia   DVT prophylaxis: lovenox   Code Status: DNR  Diet: Clear liquid diet Family Communication: Admission, patients condition and plan of care including tests being ordered have been discussed with the patient who indicates understanding and agrees with the plan and Code Status. Patient's sister was updated  Disposition Plan: The appropriate patient status for this patient is OBSERVATION. Observation status is judged to be reasonable and necessary in order to provide the required intensity of service to ensure the patient's safety. The patient's presenting symptoms, physical exam findings, and initial radiographic and laboratory data in the context of their medical condition is felt to place them at decreased risk for further clinical deterioration. Furthermore, it is anticipated that the patient will be medically stable for discharge from the hospital within 2 midnights of admission. The following factors support the patient status of observation.   " The patient's presenting symptoms include generalized weakness, dysphagia. " The physical exam findings include chronically ill appearing otherwise unremarkable. " The initial radiographic and laboratory data are unremarkable.    Status is: Observation  The patient remains OBS appropriate and will d/c before 2 midnights.  Dispo: The patient is from: Home  Anticipated d/c is to:  Home              Anticipated d/c date is: 1 day              Patient currently is not medically stable to d/c.     Consultants  . Oncology . Palliative  Procedures  . none  Time Spent on Admission: 67 minutes    Harold Hedge, DO [facility]  03/19/2020, 4:44 PM

## 2020-03-19 NOTE — Telephone Encounter (Signed)
Call received from patient's sister, Barnetta Chapel to notify Dr. Marin Olp that pt is currently in the ER at [facility].  Dr. Marin Olp notified.

## 2020-03-19 NOTE — ED Notes (Signed)
Chaplain arrived for Spiritual care

## 2020-03-19 NOTE — Progress Notes (Signed)
Chaplain engaged in initial visit with Caitlin Greene and her oldest sister.  During visit, chaplain was able to learn that Caitlin Greene is the youngest of six children. Caitlin Greene's sister expressed that they grew up with a lot of love, laughter and humor.  Chaplain could assess the closeness that exists between the siblings.  Caitlin Greene has a great support system. Sister also shared that Caitlin Greene experienced her first major health challenge at 54 years old.  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  Her sister described her as having "a big brain" indicating her intelligence.  Caitlin Greene is also widowed, having lost her husband a couple years ago.  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  Chaplain offered the ministries of presence and listening as Caitlin Greene and her sister shared her journey and their family history.   Caitlin Greene describes hearing her new diagnosis as "being hard."  Her sister noted that she has had some incredibly difficult experiences lately.  Sister noted that words of encouragement were needed.  Chaplain prayed for Caitlin Greene with sister.    Chaplain offered support and worked to affirm the incredible journey Caitlin Greene has been on throughout her life. There has been a lot of love and a lot of grief.  They were thankful for prayer and chaplain's presence.  Chaplain will follow-up as needed.    03/19/20 0900  Clinical Encounter Type  Visited With Patient and family together  Visit Type Initial;Spiritual support  Referral From Physician;Family  Consult/Referral To Chaplain  Spiritual Encounters  Spiritual Needs Emotional;Grief support;Prayer  Stress Factors  Patient Stress Factors Major life changes;Health changes      03/19/20 0900  Clinical Encounter Type  Visited With Patient and family together  Visit Type Initial;Spiritual support  Referral From Physician;Family  Consult/Referral To Chaplain   Spiritual Encounters  Spiritual Needs Emotional;Grief support;Prayer  Stress Factors  Patient Stress Factors Major life changes;Health changes

## 2020-03-19 NOTE — ED Provider Notes (Addendum)
[facility] DEPT Provider Note   CSN: [id number] Arrival date & time: 03/19/20  0759     History Chief Complaint  Patient presents with  . Weakness  . Nausea    Sunday Caitlin Greene is a 54 y.o. female.  Presents here with concern for weakness, nausea, shortness of breath.  She reports that she has had general fatigue, malaise that is worsened over the past couple weeks, also feeling more nauseous than normal.  Has chronic vertigo, unchanged from normal.  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   HPI     Past Medical History:  Diagnosis Date  . ALLERGIC RHINITIS   . ANEMIA-IRON DEFICIENCY   . ANXIETY   . BREAST CANCER, HX OF 01/21/2007   at 54yo  . DEPRESSION   . GERD   . HYPERLIPIDEMIA   . RENAL CALCULUS     Patient Active Problem List   Diagnosis Date Noted  . Encounter for well adult exam with abnormal findings 04/12/2018  . Deviated septum 01/13/2016  . Nasal turbinate hypertrophy 12/11/2015  . Rhinitis, chronic 12/11/2015  . Acute sinus infection 07/30/2014  . LLL pneumonia 06/04/2014  . Chronic sinusitis 01/25/2012  . Vertigo 01/25/2012  . ETD (eustachian tube dysfunction) 10/01/2011  . HEMATOCHEZIA 05/31/2010  . ANEMIA-IRON DEFICIENCY 10/11/2007  . GERD 10/11/2007  . HYPERLIPIDEMIA 01/21/2007  . Anxiety state 01/21/2007  . Depression 01/21/2007  . Allergic rhinitis 01/21/2007  . RENAL CALCULUS 01/21/2007  . BREAST CANCER, HX OF 01/21/2007    Past Surgical History:  Procedure Laterality Date  . BREAST ENHANCEMENT SURGERY    . MASTECTOMY     right  . TEMPOROMANDIBULAR JOINT SURGERY     Left  . TONSILLECTOMY       OB History   No obstetric history on file.     Family History  Problem Relation Age of Onset  . Colon polyps Mother   . Cancer Mother        Uterine Cancer  . Hypertension Mother   . Dementia  Mother   . Colon polyps Father   . COPD Father        smoked  . Hypertension Other   . Diabetes Other   . Asthma Sister     Social History   Tobacco Use  . Smoking status: Never Smoker  . Smokeless tobacco: Never Used  Vaping Use  . Vaping Use: Never used  Substance Use Topics  . Alcohol use: No    Alcohol/week: 0.0 standard drinks  . Drug use: No    Home Medications Prior to Admission medications   Medication Sig Start Date End Date Taking? Authorizing Provider  acetaminophen (TYLENOL) 650 MG CR tablet Take 650 mg by mouth every 8 (eight) hours as needed for pain.   Yes [provider]  anastrozole (ARIMIDEX) 1 MG tablet Take 1 mg by mouth daily. 02/24/20  Yes [provider]  CALCIUM PO Take 1 tablet by mouth daily.   Yes [provider]  FLUoxetine (PROZAC) 20 MG capsule TAKE 1 CAPSULE BY MOUTH EVERY DAY 09/19/19  Yes Biagio Borg, MD  FLUoxetine (PROZAC) 40 MG capsule Take 40 mg by mouth daily. 03/10/20  Yes [provider]  hydrOXYzine (ATARAX/VISTARIL) 25 MG tablet Take 25 mg by mouth 3 (three) times daily as needed for anxiety. 12/31/19  Yes [provider]  IBRANCE 100 MG tablet  Take 100 mg by mouth See admin instructions. 100mg  daily on day 1-21 of cycle. 7 days off.  Repeat. 03/13/20  Yes [provider]  methadone (DOLOPHINE) 5 MG tablet Take 5 mg by mouth at bedtime. 02/19/20  Yes [provider]  omeprazole (PRILOSEC) 20 MG capsule Take 20 mg by mouth daily. 01/28/20  Yes [provider]  ondansetron (ZOFRAN) 4 MG tablet Take 4 mg by mouth every 4 (four) hours as needed for nausea/vomiting. 02/13/20  Yes [provider]  oxyCODONE (OXYCONTIN) 20 mg 12 hr tablet Take 10-20 mg by mouth See admin instructions. 10mg  in the morning 20mg  at night   Yes [provider]  SM SENNA LAXATIVE 8.6 MG tablet Take 8.6 mg by mouth daily. 10/30/19  Yes [provider]  cyclobenzaprine  (FLEXERIL) 10 MG tablet Take 1 tablet (10 mg total) by mouth 3 (three) times daily as needed for muscle spasms. Patient not taking: Reported on 03/19/2020 10/22/19   Kandra Nicolas, MD  oxyCODONE (OXY IR/ROXICODONE) 5 MG immediate release tablet Take 5 mg by mouth every 4 (four) hours as needed for pain. 03/12/20   [provider]    Allergies    Amoxicillin-pot clavulanate and Codeine  Review of Systems   Review of Systems  Constitutional: Negative for chills and fever.  HENT: Negative for ear pain and sore throat.   Eyes: Negative for pain and visual disturbance.  Respiratory: Positive for shortness of breath. Negative for cough.   Cardiovascular: Negative for chest pain and palpitations.  Gastrointestinal: Positive for nausea and vomiting. Negative for abdominal pain.  Genitourinary: Negative for dysuria and hematuria.  Musculoskeletal: Negative for arthralgias and back pain.  Skin: Negative for color change and rash.  Neurological: Negative for seizures and syncope.  All other systems reviewed and are negative.   Physical Exam Updated Vital Signs BP 110/75   Pulse 73   Temp 97.6 F (36.4 C) (Oral)   Resp 15   Ht 5\' 8"  (1.727 m)   Wt 93.4 kg   LMP 06/09/2017 (Exact Date)   SpO2 97%   BMI 31.31 kg/m   Physical Exam Vitals and nursing note reviewed.  Constitutional:      General: She is not in acute distress.    Appearance: She is well-developed.  HENT:     Head: Normocephalic and atraumatic.  Eyes:     Conjunctiva/sclera: Conjunctivae normal.  Cardiovascular:     Rate and Rhythm: Normal rate and regular rhythm.     Pulses: Normal pulses.     Heart sounds: No murmur heard.   Pulmonary:     Effort: Pulmonary effort is normal. No respiratory distress.     Breath sounds: Normal breath sounds.  Abdominal:     Palpations: Abdomen is soft.     Tenderness: There is no abdominal tenderness.  Musculoskeletal:        General: No deformity or signs of injury.       Cervical back: Neck supple.  Skin:    General: Skin is warm and dry.     Capillary Refill: Capillary refill takes less than 2 seconds.  Neurological:     General: No focal deficit present.     Mental Status: She is alert.  Psychiatric:        Mood and Affect: Mood normal.     ED Results / Procedures / Treatments   Labs (all labs ordered are listed, but only abnormal results are displayed) Labs Reviewed  CBC WITH DIFFERENTIAL/PLATELET - Abnormal; Notable for the following components:      Result Value   WBC 2.4 (*)    RBC 3.75 (*)    MCH 35.5 (*)    RDW 19.7 (*)    Neutro Abs 1.2 (*)    All other components within normal limits  COMPREHENSIVE METABOLIC PANEL - Abnormal; Notable for the following components:   Potassium 3.4 (*)    CO2 19 (*)    Glucose, Bld 103 (*)    All other components within normal limits  RESPIRATORY PANEL BY RT PCR (FLU A&B, COVID)  BRAIN NATRIURETIC PEPTIDE  TROPONIN I (HIGH SENSITIVITY)  TROPONIN I (HIGH SENSITIVITY)    EKG EKG Interpretation  Date/Time:  Thursday March 19 2020 08:17:41 EST Ventricular Rate:  86 PR Interval:    QRS Duration: 96 QT Interval:  410 QTC Calculation: 491 R Axis:   91 Text Interpretation: Sinus rhythm Low voltage, precordial leads Consider right ventricular hypertrophy Nonspecific T abnormalities, anterior leads Borderline prolonged QT interval Confirmed by Madalyn Rob [phone]) on 03/19/2020 8:56:36 AM   Radiology DG Chest 2 View  Result Date: 03/19/2020 CLINICAL DATA:  Shortness of breath. EXAM: CHEST - 2 VIEW COMPARISON:  February 25, 2020. FINDINGS: The heart size and mediastinal contours are within normal limits. No pneumothorax or pleural effusion is noted. Mild bibasilar subsegmental atelectasis or infiltrates are noted. Small sliding-type hiatal hernia is noted. Status post kyphoplasty at 2 levels within the thoracic spine. IMPRESSION: Mild bibasilar subsegmental atelectasis or infiltrates are  noted. Electronically Signed   By: Marijo Conception M.D.   On: 03/19/2020 09:14   CT Head W or Wo Contrast  Result Date: 03/19/2020 CLINICAL DATA:  History of skull base metastatic disease. Generalized weakness over the last 2 weeks. Metastatic breast cancer. EXAM: CT HEAD WITHOUT AND WITH CONTRAST TECHNIQUE: Contiguous axial images were obtained from the base of the skull through the vertex without and with intravenous contrast CONTRAST:  151mL OMNIPAQUE IOHEXOL 350 MG/ML SOLN COMPARISON:  Cervical MRI 10/24/2019. Bone scan 02/17/2020. Head CT 12/02/2019 FINDINGS: Brain: The brain has normal appearance without evidence of atrophy, old or recent infarction, mass lesion, hemorrhage, hydrocephalus or extra-axial collection. No abnormal contrast enhancement occurs. Vascular: No abnormal vascular finding. Skull: 13 x 9 x 9 mm sclerotic focus within the right side of the clivus without evidence lytic bone destruction or extraosseous tumor. This could be related to bony metastatic disease or could be a focus of fibrous dysplasia. No second regional lesion is identified. Sinuses/Orbits: Clear sinuses as seen. Limited orbital visualization is negative. Other: None IMPRESSION: 1. 13 x 9 x 9 mm sclerotic focus within the right side of the clivus without evidence of lytic bone destruction or extraosseous tumor. This could be related to bony metastatic disease or could be a focus of fibrous dysplasia. No second regional lesion is identified. 2. Normal appearance of the brain itself. Electronically Signed   By: Nelson Chimes M.D.   On: 03/19/2020 11:40   CT Angio Chest PE W and/or Wo Contrast  Result Date: 03/19/2020 CLINICAL DATA:  High probability for pulmonary embolism. Shortness of breath EXAM: CT ANGIOGRAPHY CHEST WITH CONTRAST TECHNIQUE: Multidetector CT imaging of the chest was performed using the standard protocol during bolus administration of intravenous contrast. Multiplanar CT image reconstructions and MIPs  were obtained to evaluate the vascular anatomy. CONTRAST:  117mL OMNIPAQUE IOHEXOL 350 MG/ML SOLN COMPARISON:  06/04/2014 FINDINGS: Cardiovascular: Normal heart size. No pericardial effusion. No  pulmonary artery filling defects. Normal aorta Mediastinum/Nodes: Large sliding hiatal hernia. Lungs/Pleura: Dependent streaky opacity consistent with atelectasis. There is no edema, consolidation, effusion, or pneumothorax. Upper Abdomen: No acute finding Musculoskeletal: No acute finding. T6 and T10 compression fracture with advanced height loss at T10. Both of these levels have undergone cement augmentation. Multifocal vertebral metastatic disease with similar extent when compared to a thoracic MRI October 24, 2019. No evidence of acute fracture. Other: Intra and extracapsular breast implant rupture on the right, chronic when compared to 2016. Review of the MIP images confirms the above findings. IMPRESSION: 1. Negative for pulmonary embolism. 2. Low volume chest with atelectasis at the bases. 3. Known osseous metastatic disease.  No acute fracture. 4. Large sliding hiatal hernia. Electronically Signed   By: Monte Fantasia M.D.   On: 03/19/2020 11:45    Procedures Procedures (including critical care time)  Medications Ordered in ED Medications  sodium chloride (PF) 0.9 % injection (has no administration in time range)  0.9 %  sodium chloride infusion ( Intravenous New Bag/Given 03/19/20 1337)  iohexol (OMNIPAQUE) 350 MG/ML injection 100 mL (100 mLs Intravenous Contrast Given 03/19/20 1045)  sodium chloride 0.9 % bolus 500 mL (0 mLs Intravenous Stopped 03/19/20 1312)  ondansetron (ZOFRAN) injection 4 mg (4 mg Intravenous Given 03/19/20 1225)  promethazine (PHENERGAN) injection 12.5 mg (12.5 mg Intravenous Given 03/19/20 1337)    ED Course  I have reviewed the triage vital signs and the nursing notes.  Pertinent labs & imaging results that were available during my care of the patient were reviewed by me and  considered in my medical decision making (see chart for details).    MDM Rules/Calculators/A&P                         54 year old lady with history of breast cancer metastatic to bone presenting to ER with concern for nausea, shortness of breath.  On exam patient mildly uncomfortable but in no acute distress with stable vital signs.  Regarding her shortness of breath, basic labs were obtained as well as EKG and CXR.  EKG without acute ischemic abnormalities, troponin undetectable.  CXR grossly negative.  Mild leukopenia, reported at [facility] notes.  Due to cancer history, check CTA to rule out PE, negative for pulmonary embolus.  No pneumonia.  Regarding nausea, suspect this may be due to the chemo agents she is been taking.  Soft abdomen on serial exam.  Doubt acute abdominal process. CT head negative for new finding; known clivus lesion. Provided symptomatic control with multiple rounds of antiemetics, fluids however patient still complaining of ongoing nausea and had some ongoing vomiting.  Will consult [facility] for admission due to intractable nausea and vomiting.  Final Clinical Impression(s) / ED Diagnoses Final diagnoses:  Intractable nausea and vomiting  Metastatic breast cancer [facility])    Rx / DC Orders ED Discharge Orders    None       Lucrezia Starch, MD 03/19/20 1429    Lucrezia Starch, MD 04/07/20 1630

## 2020-03-20 ENCOUNTER — Observation Stay (HOSPITAL_COMMUNITY): Payer: BC Managed Care – PPO

## 2020-03-20 ENCOUNTER — Encounter (HOSPITAL_COMMUNITY): Payer: Self-pay | Admitting: Internal Medicine

## 2020-03-20 DIAGNOSIS — R531 Weakness: Secondary | ICD-10-CM | POA: Diagnosis not present

## 2020-03-20 DIAGNOSIS — G893 Neoplasm related pain (acute) (chronic): Secondary | ICD-10-CM | POA: Diagnosis not present

## 2020-03-20 DIAGNOSIS — R1113 Vomiting of fecal matter: Secondary | ICD-10-CM | POA: Diagnosis not present

## 2020-03-20 DIAGNOSIS — C50919 Malignant neoplasm of unspecified site of unspecified female breast: Secondary | ICD-10-CM | POA: Diagnosis not present

## 2020-03-20 DIAGNOSIS — D539 Nutritional anemia, unspecified: Secondary | ICD-10-CM

## 2020-03-20 DIAGNOSIS — R1310 Dysphagia, unspecified: Secondary | ICD-10-CM | POA: Diagnosis not present

## 2020-03-20 DIAGNOSIS — K449 Diaphragmatic hernia without obstruction or gangrene: Secondary | ICD-10-CM | POA: Diagnosis not present

## 2020-03-20 DIAGNOSIS — R112 Nausea with vomiting, unspecified: Secondary | ICD-10-CM | POA: Diagnosis not present

## 2020-03-20 LAB — COMPREHENSIVE METABOLIC PANEL
ALT: 18 U/L (ref 0–44)
AST: 14 U/L — ABNORMAL LOW (ref 15–41)
Albumin: 3.2 g/dL — ABNORMAL LOW (ref 3.5–5.0)
Alkaline Phosphatase: 34 U/L — ABNORMAL LOW (ref 38–126)
Anion gap: 6 (ref 5–15)
BUN: 7 mg/dL (ref 6–20)
CO2: 23 mmol/L (ref 22–32)
Calcium: 7.5 mg/dL — ABNORMAL LOW (ref 8.9–10.3)
Chloride: 110 mmol/L (ref 98–111)
Creatinine, Ser: 0.63 mg/dL (ref 0.44–1.00)
GFR, Estimated: >60 mL/min (ref >60)
Glucose, Bld: 71 mg/dL (ref 70–99)
Potassium: 3.7 mmol/L (ref 3.5–5.1)
Sodium: 139 mmol/L (ref 135–145)
Total Bilirubin: 0.8 mg/dL (ref 0.3–1.2)
Total Protein: 5.9 g/dL — ABNORMAL LOW (ref 6.5–8.1)

## 2020-03-20 LAB — TROPONIN I (HIGH SENSITIVITY): Troponin I (High Sensitivity): <2 ng/L (ref <18)

## 2020-03-20 LAB — CBC
HCT: 30.5 % — ABNORMAL LOW (ref 36.0–46.0)
Hemoglobin: 10.4 g/dL — ABNORMAL LOW (ref 12.0–15.0)
MCH: 35.5 pg — ABNORMAL HIGH (ref 26.0–34.0)
MCHC: 34.1 g/dL (ref 30.0–36.0)
MCV: 104.1 fL — ABNORMAL HIGH (ref 80.0–100.0)
Platelets: 143 10*3/uL — ABNORMAL LOW (ref 150–400)
RBC: 2.93 MIL/uL — ABNORMAL LOW (ref 3.87–5.11)
RDW: 20.4 % — ABNORMAL HIGH (ref 11.5–15.5)
WBC: 2.6 10*3/uL — ABNORMAL LOW (ref 4.0–10.5)
nRBC: 0 % (ref 0.0–0.2)

## 2020-03-20 LAB — CREATININE, SERUM
Creatinine, Ser: 0.58 mg/dL (ref 0.44–1.00)
GFR, Estimated: >60 mL/min (ref >60)

## 2020-03-20 LAB — HIV ANTIBODY (ROUTINE TESTING W REFLEX): HIV Screen 4th Generation wRfx: NONREACTIVE

## 2020-03-20 MED ORDER — LORAZEPAM 2 MG/ML IJ SOLN: 1.0000 mg | INTRAMUSCULAR | Status: AC

## 2020-03-20 MED ORDER — LACTATED RINGERS IV BOLUS
1000.0000 mL | Freq: Once | INTRAVENOUS | Status: AC
  Administered 2020-03-20: 1000 mL via INTRAVENOUS

## 2020-03-20 MED ORDER — DEXTROSE IN LACTATED RINGERS 5 % IV SOLN: INTRAVENOUS | Status: DC

## 2020-03-20 MED ORDER — POLYETHYLENE GLYCOL 3350 17 G PO PACK
17.0000 g | PACK | Freq: Every day | ORAL | Status: DC
  Administered 2020-03-20: 17 g via ORAL

## 2020-03-20 MED ORDER — SENNOSIDES-DOCUSATE SODIUM 8.6-50 MG PO TABS
1.0000 | ORAL_TABLET | Freq: Two times a day (BID) | ORAL | Status: DC
  Administered 2020-03-20 – 2020-03-24 (×7): 1 via ORAL
  Filled 2020-03-20 (×7): qty 1

## 2020-03-20 MED ORDER — DRONABINOL 5 MG PO CAPS
5.0000 mg | ORAL_CAPSULE | Freq: Two times a day (BID) | ORAL | Status: DC
  Administered 2020-03-20 – 2020-03-24 (×7): 5 mg via ORAL
  Filled 2020-03-20 (×8): qty 1

## 2020-03-20 MED ORDER — GADOBUTROL 1 MMOL/ML IV SOLN
9.0000 mL | Freq: Once | INTRAVENOUS | Status: AC | PRN
  Administered 2020-03-20: 9 mL via INTRAVENOUS

## 2020-03-20 MED ORDER — LORAZEPAM 1 MG PO TABS
1.0000 mg | ORAL_TABLET | Freq: Once | ORAL | Status: AC
  Administered 2020-03-20: 1 mg via ORAL
  Filled 2020-03-20: qty 1

## 2020-03-20 NOTE — Consult Note (Addendum)
[place]  [phone] [phone]   MEDICAL ONCOLOGY - INITIAL CONSULTATION  Referral MD: Dr. Florencia Reasons  Reason for Referral: Metastatic breast cancer  HPI: Caitlin Greene is a 54 year old female with a past medical history significant for DCIS diagnosed about 30 years ago status post right mastectomy and recently found to have metastases to the bone and possibly lung who has been taking anastrozole and Leslee Home is followed at [facility], GERD, chronic vertigo, anxiety, depression, hyperlipidemia.  She presented to the emergency room with generalized weakness, nausea, vomiting, and shortness of breath for the past few weeks.  The patient was in the process of trying to transfer her care from [facility] to Dr. Marin Olp.  Labs performed on admission showed a WBC of 2.4, ANC 1.2, potassium 3.4.  Chest x-ray showed mild bibasilar subsegmental atelectasis or infiltrates.  CTA chest negative for PE but did show the known osseous metastatic disease with no acute fracture.  CT of the head showed a 13 x 9 x 9 mm sclerotic focus in the right side of the clivus without evidence of lytic bone destruction or extraosseous tumor.  Outside records through care everywhere have been reviewed.  According to the records, the patient was tolerating her anastrozole and Ibrance well overall with the exception of mild neutropenia.  She was also receiving Xgeva for her bone mets.  Most recent scans performed at their facility were on 02/17/2020.  A CT of the chest/abdomen/pelvis showed extensive osseous metastatic disease with progression of sclerosis as well as status post interval cement augmentation of T6, T10, L2 pathologic fractures-increase fluorosis is nonspecific and could be related to treatment changes or disease progression, nonspecific fat stranding adjacent to proximal sigmoid colon in region of diverticular disease, no interval change in the size of the 4 to 5 mm pulmonary nodules  bilaterally as well as the 1.3 cm left adrenal lesion, no new or worsening lymphadenopathy.  Bone scan showed no new focal sites of osteoblastic metastatic disease and was stable compared to the prior bone scan performed on 12/02/2019.  The patient tells me that earlier this year, she had some bone pain which was under evaluation.  At 1 point, she thought she had a kidney stone.  This prompted additional testing which led to her diagnosis of recurrent, metastatic breast cancer.  The patient reports that she has been on anastrozole and Ibrance for over 2 months.  These medications were placed on hold at the time of admission as they were thought to be contributing to her nausea and vomiting.  She has also been receiving Xgeva. When seen today, speech therapy was at the bedside.  The patient reports some difficulty swallowing and the sensation that food gets stuck in her throat. She reports improvement in her nausea and vomiting since admission.  She was eating at the time my visit.  She reports chronic vertigo.  She also had a severe headache earlier this week.  She reports a decrease in her appetite due to difficulty swallowing.  She has some chest discomfort last evening.  Denies shortness of breath.  Denies abdominal pain.  Reports chronic constipation.  She continues to have left hip pain and back pain.  Medical oncology  was asked see the patient to make recommendations regarding her metastatic breast cancer.   Past Medical History:  Diagnosis Date  . ALLERGIC RHINITIS   . ANEMIA-IRON DEFICIENCY   . ANXIETY   . BREAST CANCER, HX OF 01/21/2007  at 54yo  . DEPRESSION   . GERD   . HYPERLIPIDEMIA   . RENAL CALCULUS   :  Past Surgical History:  Procedure Laterality Date  . BREAST ENHANCEMENT SURGERY    . MASTECTOMY     right  . TEMPOROMANDIBULAR JOINT SURGERY     Left  . TONSILLECTOMY    :  Current Facility-Administered Medications  Medication Dose Route Frequency Provider Last Rate Last  Admin  . acetaminophen (TYLENOL) tablet 650 mg  650 mg Oral Q6H PRN Harold Hedge, MD       Or  . acetaminophen (TYLENOL) suppository 650 mg  650 mg Rectal Q6H PRN Harold Hedge, MD      . dronabinol (MARINOL) capsule 5 mg  5 mg Oral BID AC [name], MD   5 mg at 03/20/20 1146  . enoxaparin (LOVENOX) injection 40 mg  40 mg Subcutaneous Q24H Harold Hedge, MD   40 mg at 03/19/20 1543  . HYDROmorphone (DILAUDID) injection 0.5 mg  0.5 mg Intravenous Q2H PRN Harold Hedge, MD   0.5 mg at 03/20/20 0236  . lactated ringers infusion   Intravenous Continuous Harold Hedge, MD   Stopped at 03/20/20 1131  . LORazepam (ATIVAN) injection 1 mg  1 mg Intravenous On Call Florencia Reasons, MD      . methadone (DOLOPHINE) tablet 5 mg  5 mg Oral QHS Harold Hedge, MD   5 mg at 03/19/20 2109  . ondansetron (ZOFRAN) tablet 4 mg  4 mg Oral Q6H PRN Harold Hedge, MD       Or  . ondansetron [facility]) injection 4 mg  4 mg Intravenous Q6H PRN Harold Hedge, MD   4 mg at 03/20/20 0235  . oxyCODONE (OXYCONTIN) 12 hr tablet 10 mg  10 mg Oral Daily Harold Hedge, MD   10 mg at 03/20/20 1124   And  . oxyCODONE (OXYCONTIN) 12 hr tablet 20 mg  20 mg Oral QPM Harold Hedge, MD   20 mg at 03/19/20 1754  . pantoprazole (PROTONIX) EC tablet 40 mg  40 mg Oral QPM Harold Hedge, MD   40 mg at 03/19/20 1754  . polyethylene glycol (MIRALAX / GLYCOLAX) packet 17 g  17 g Oral Daily PRN Harold Hedge, MD      . senna [facility]) tablet 8.6 mg  1 tablet Oral BID Harold Hedge, MD   8.6 mg at 03/20/20 1124  . sodium chloride flush (NS) 0.9 % injection 3 mL  3 mL Intravenous Q12H Harold Hedge, MD   3 mL at 03/20/20 1129   Current Outpatient Medications  Medication Sig Dispense Refill  . acetaminophen (TYLENOL) 650 MG CR tablet Take 650 mg by mouth every 8 (eight) hours as needed for pain.    Marland Kitchen anastrozole (ARIMIDEX) 1 MG tablet Take 1 mg by mouth daily.    Marland Kitchen CALCIUM PO Take 1 tablet by mouth daily.    Marland Kitchen FLUoxetine (PROZAC)  20 MG capsule TAKE 1 CAPSULE BY MOUTH EVERY DAY 90 capsule 1  . FLUoxetine (PROZAC) 40 MG capsule Take 40 mg by mouth daily.    . hydrOXYzine (ATARAX/VISTARIL) 25 MG tablet Take 25 mg by mouth 3 (three) times daily as needed for anxiety.    Leslee Home 100 MG tablet Take 100 mg by mouth See admin instructions. 169m daily on day 1-21 of cycle. 7 days off.  Repeat.    . methadone (DOLOPHINE)  5 MG tablet Take 5 mg by mouth at bedtime.    Marland Kitchen omeprazole (PRILOSEC) 20 MG capsule Take 20 mg by mouth daily.    . ondansetron (ZOFRAN) 4 MG tablet Take 4 mg by mouth every 4 (four) hours as needed for nausea/vomiting.    Marland Kitchen oxyCODONE (OXYCONTIN) 20 mg 12 hr tablet Take 10-20 mg by mouth See admin instructions. 5m in the morning 235mat night    . SM SENNA LAXATIVE 8.6 MG tablet Take 8.6 mg by mouth daily.    . cyclobenzaprine (FLEXERIL) 10 MG tablet Take 1 tablet (10 mg total) by mouth 3 (three) times daily as needed for muscle spasms. (Patient not taking: Reported on 03/19/2020) 20 tablet 1  . oxyCODONE (OXY IR/ROXICODONE) 5 MG immediate release tablet Take 5 mg by mouth every 4 (four) hours as needed for pain.       Allergies  Allergen Reactions  . Amoxicillin-Pot Clavulanate Hives  . Codeine Nausea And Vomiting    Needs pre-meds Needs pre-meds  :  Family History  Problem Relation Age of Onset  . Colon polyps Mother   . Cancer Mother        Uterine Cancer  . Hypertension Mother   . Dementia Mother   . Colon polyps Father   . COPD Father        smoked  . Hypertension Other   . Diabetes Other   . Asthma Sister   :  Social History   Socioeconomic History  . Marital status: Widowed    Spouse name: Not on file  . Number of children: 0  . Years of education: Not on file  . Highest education level: Not on file  Occupational History  . Occupation: BANKRUPTCY ANALYST    Employer: [employer]  . Smoking status: Never Smoker  . Smokeless tobacco: Never Used  Vaping Use   . Vaping Use: Never used  Substance and Sexual Activity  . Alcohol use: No    Alcohol/week: 0.0 standard drinks  . Drug use: No  . Sexual activity: Not on file  Other Topics Concern  . Not on file  Social History Narrative  . Not on file   Social Determinants of Health   Financial Resource Strain:   . Difficulty of Paying Living Expenses: Not on file  Food Insecurity:   . Worried About RuCharity fundraisern the Last Year: Not on file  . Ran Out of Food in the Last Year: Not on file  Transportation Needs:   . Lack of Transportation (Medical): Not on file  . Lack of Transportation (Non-Medical): Not on file  Physical Activity:   . Days of Exercise per Week: Not on file  . Minutes of Exercise per Session: Not on file  Stress:   . Feeling of Stress : Not on file  Social Connections:   . Frequency of Communication with Friends and Family: Not on file  . Frequency of Social Gatherings with Friends and Family: Not on file  . Attends Religious Services: Not on file  . Active Member of Clubs or Organizations: Not on file  . Attends ClArchivisteetings: Not on file  . Marital Status: Not on file  Intimate Partner Violence:   . Fear of Current or Ex-Partner: Not on file  . Emotionally Abused: Not on file  . Physically Abused: Not on file  . Sexually Abused: Not on file  :  Review of Systems: A comprehensive  14 point review of systems was negative except as noted in the HPI.  Exam: Patient Vitals for the past 24 hrs:  BP Pulse Resp SpO2  03/20/20 1130 109/81 100 17 90 %  03/20/20 1000 112/83 -- 17 --  03/20/20 0945 -- -- 13 --  03/20/20 0930 130/72 76 (!) 21 98 %  03/20/20 0915 -- 70 14 93 %  03/20/20 0900 104/75 67 17 93 %  03/20/20 0845 -- 64 15 94 %  03/20/20 0830 115/71 63 13 96 %  03/20/20 0826 117/80 87 19 97 %  03/20/20 0815 -- -- 14 --  03/20/20 0800 109/75 66 20 98 %  03/20/20 0745 -- -- 12 --  03/20/20 0730 112/79 -- 12 --  03/20/20 0715 -- 69 15  93 %  03/20/20 0700 (!) 124/110 82 (!) 21 97 %  03/20/20 0645 -- 69 12 93 %  03/20/20 0630 107/74 68 11 93 %  03/20/20 0615 -- 63 14 94 %  03/20/20 0600 106/69 (!) 58 10 96 %  03/20/20 0545 -- 62 12 94 %  03/20/20 0530 111/79 64 12 91 %  03/20/20 0515 -- 65 11 92 %  03/20/20 0500 103/70 68 12 92 %  03/20/20 0445 -- 67 11 92 %  03/20/20 0430 106/78 60 11 94 %  03/20/20 0415 -- 79 14 97 %  03/20/20 0400 99/73 68 12 92 %  03/20/20 0345 -- 72 15 93 %  03/20/20 0330 103/77 72 12 93 %  03/20/20 0300 115/81 69 12 95 %  03/20/20 0230 105/71 63 11 93 %  03/20/20 0200 112/78 67 12 94 %  03/20/20 0145 -- 76 14 96 %  03/20/20 0130 103/68 70 13 93 %  03/20/20 0115 -- 66 16 92 %  03/20/20 0100 101/71 66 12 94 %  03/20/20 0045 -- 68 19 93 %  03/20/20 0030 110/71 66 16 93 %  03/20/20 0015 -- 65 12 95 %  03/20/20 0000 104/73 68 18 94 %  03/19/20 2345 -- 71 20 94 %  03/19/20 2330 110/74 67 (!) 21 97 %  03/19/20 2315 -- 66 19 92 %  03/19/20 2300 109/73 64 18 96 %  03/19/20 2245 -- 66 16 94 %  03/19/20 2230 106/73 71 13 94 %  03/19/20 2215 -- 69 14 94 %  03/19/20 2212 111/74 68 17 94 %  03/19/20 2200 111/74 65 14 94 %  03/19/20 2145 -- 69 14 94 %  03/19/20 2130 109/78 70 13 95 %  03/19/20 2115 -- 74 18 100 %  03/19/20 2100 118/84 74 14 98 %  03/19/20 1930 115/76 73 18 97 %  03/19/20 1900 105/74 67 16 93 %  03/19/20 1830 105/71 68 14 94 %  03/19/20 1815 -- 70 15 95 %  03/19/20 1800 110/75 73 15 98 %  03/19/20 1745 -- 92 16 96 %  03/19/20 1730 109/70 79 19 94 %  03/19/20 1715 -- (!) 106 (!) 22 99 %  03/19/20 1700 106/71 79 18 97 %  03/19/20 1645 -- 68 12 99 %  03/19/20 1630 (!) 121/104 93 (!) 22 94 %  03/19/20 1615 -- 72 14 96 %  03/19/20 1600 109/76 72 16 98 %  03/19/20 1545 -- 75 (!) 23 96 %  03/19/20 1530 116/88 86 18 98 %  03/19/20 1515 -- 72 13 99 %  03/19/20 1500 123/83 78 (!) 22 96 %  03/19/20 1445 -- 72 (!)  22 99 %  03/19/20 1430 110/75 69 14 97 %  03/19/20 1415 -- 71 17  96 %  03/19/20 1400 110/75 73 15 97 %  03/19/20 1345 -- 71 16 96 %  03/19/20 1330 109/70 78 13 96 %  03/19/20 1315 -- 77 17 97 %  03/19/20 1300 107/75 72 13 97 %  03/19/20 1245 -- 89 20 96 %  03/19/20 1230 107/74 76 13 98 %  03/19/20 1215 -- 97 17 96 %  03/19/20 1200 -- 70 14 96 %    General: Chronically ill-appearing female, no distress. Eyes: PERRL, EOMI, no scleral icterus.   ENT:  There were no oropharyngeal lesions.   Neck was without thyromegaly.   Lymphatics:  Negative cervical, supraclavicular or axillary adenopathy.   Breasts: Bilateral implants.  No palpable masses or overlying nodularity. Respiratory: lungs were clear bilaterally without wheezing or crackles.   Cardiovascular:  Regular rate and rhythm, S1/S2, without murmur, rub or gallop.  There was no pedal edema.   GI:  abdomen was soft, flat, nontender, nondistended, without organomegaly.   Musculoskeletal:  no spinal tenderness of palpation of vertebral spine.   Skin exam was without echymosis, petichae.   Neuro exam was nonfocal. Patient was alert and oriented.  Attention was good.   Language was appropriate.  Mood was normal without depression.  Speech was not pressured.  Thought content was not tangential.     Lab Results  Component Value Date   WBC 2.6 (L) 03/20/2020   HGB 10.4 (L) 03/20/2020   HCT 30.5 (L) 03/20/2020   PLT 143 (L) 03/20/2020   GLUCOSE 71 03/20/2020   CHOL 212 (H) 04/12/2018   TRIG 229.0 (H) 04/12/2018   HDL 52.00 04/12/2018   LDLDIRECT 141.0 04/12/2018   LDLCALC 129 (H) 04/12/2013   ALT 18 03/20/2020   AST 14 (L) 03/20/2020   NA 139 03/20/2020   K 3.7 03/20/2020   CL 110 03/20/2020   CREATININE 0.58 03/20/2020   BUN 7 03/20/2020   CO2 23 03/20/2020    DG Chest 2 View  Result Date: 03/19/2020 CLINICAL DATA:  Shortness of breath. EXAM: CHEST - 2 VIEW COMPARISON:  February 25, 2020. FINDINGS: The heart size and mediastinal contours are within normal limits. No pneumothorax or  pleural effusion is noted. Mild bibasilar subsegmental atelectasis or infiltrates are noted. Small sliding-type hiatal hernia is noted. Status post kyphoplasty at 2 levels within the thoracic spine. IMPRESSION: Mild bibasilar subsegmental atelectasis or infiltrates are noted. Electronically Signed   By: Marijo Conception M.D.   On: 03/19/2020 09:14   CT Head W or Wo Contrast  Result Date: 03/19/2020 CLINICAL DATA:  History of skull base metastatic disease. Generalized weakness over the last 2 weeks. Metastatic breast cancer. EXAM: CT HEAD WITHOUT AND WITH CONTRAST TECHNIQUE: Contiguous axial images were obtained from the base of the skull through the vertex without and with intravenous contrast CONTRAST:  186m OMNIPAQUE IOHEXOL 350 MG/ML SOLN COMPARISON:  Cervical MRI 10/24/2019. Bone scan 02/17/2020. Head CT 12/02/2019 FINDINGS: Brain: The brain has normal appearance without evidence of atrophy, old or recent infarction, mass lesion, hemorrhage, hydrocephalus or extra-axial collection. No abnormal contrast enhancement occurs. Vascular: No abnormal vascular finding. Skull: 13 x 9 x 9 mm sclerotic focus within the right side of the clivus without evidence lytic bone destruction or extraosseous tumor. This could be related to bony metastatic disease or could be a focus of fibrous dysplasia. No second regional lesion is identified. Sinuses/Orbits:  Clear sinuses as seen. Limited orbital visualization is negative. Other: None IMPRESSION: 1. 13 x 9 x 9 mm sclerotic focus within the right side of the clivus without evidence of lytic bone destruction or extraosseous tumor. This could be related to bony metastatic disease or could be a focus of fibrous dysplasia. No second regional lesion is identified. 2. Normal appearance of the brain itself. Electronically Signed   By: Nelson Chimes M.D.   On: 03/19/2020 11:40   CT Angio Chest PE W and/or Wo Contrast  Result Date: 03/19/2020 CLINICAL DATA:  High probability for  pulmonary embolism. Shortness of breath EXAM: CT ANGIOGRAPHY CHEST WITH CONTRAST TECHNIQUE: Multidetector CT imaging of the chest was performed using the standard protocol during bolus administration of intravenous contrast. Multiplanar CT image reconstructions and MIPs were obtained to evaluate the vascular anatomy. CONTRAST:  179m OMNIPAQUE IOHEXOL 350 MG/ML SOLN COMPARISON:  06/04/2014 FINDINGS: Cardiovascular: Normal heart size. No pericardial effusion. No pulmonary artery filling defects. Normal aorta Mediastinum/Nodes: Large sliding hiatal hernia. Lungs/Pleura: Dependent streaky opacity consistent with atelectasis. There is no edema, consolidation, effusion, or pneumothorax. Upper Abdomen: No acute finding Musculoskeletal: No acute finding. T6 and T10 compression fracture with advanced height loss at T10. Both of these levels have undergone cement augmentation. Multifocal vertebral metastatic disease with similar extent when compared to a thoracic MRI October 24, 2019. No evidence of acute fracture. Other: Intra and extracapsular breast implant rupture on the right, chronic when compared to 2016. Review of the MIP images confirms the above findings. IMPRESSION: 1. Negative for pulmonary embolism. 2. Low volume chest with atelectasis at the bases. 3. Known osseous metastatic disease.  No acute fracture. 4. Large sliding hiatal hernia. Electronically Signed   By: JMonte FantasiaM.D.   On: 03/19/2020 11:45   MR BRAIN W WO CONTRAST  Result Date: 03/20/2020 CLINICAL DATA:  Metastatic breast cancer. Evaluate clivus involvement. EXAM: MRI HEAD WITHOUT AND WITH CONTRAST TECHNIQUE: Multiplanar, multiecho pulse sequences of the brain and surrounding structures were obtained without and with intravenous contrast. CONTRAST:  967mGADAVIST GADOBUTROL 1 MMOL/ML IV SOLN COMPARISON:  Head CT yesterday. Cervical MRI 10/24/2019. Bone scan 02/17/2020. Head CT 12/02/2019. FINDINGS: Brain: Diffusion imaging does not show any  acute or subacute infarction. The brainstem and cerebellum are normal. Cerebral hemispheres are normal. No evidence of metastatic disease to the brain or leptomeninges. There is some artifact related to previous temporomandibular joint region surgery on the left. No hemorrhage, hydrocephalus or extra-axial collection. No abnormal contrast enhancement. Vascular: Major vessels at the base of the brain show flow. Skull and upper cervical spine: As shown previously, there is a 13 mm region of diminished T1 and T2 signal in the right side of the clivus. This could be a metastasis or fibrous dysplasia. No lytic destructive component. No extraosseous component. No second bone lesion identified. Sinuses/Orbits: Clear/normal Other: None IMPRESSION: 1. Normal appearance of the brain itself. No evidence of metastatic disease to the brain or leptomeninges. 2. 13 mm region of diminished T1 and T2 signal in the right side of the clivus. This could be a metastasis or fibrous dysplasia. No extraosseous component. No second bone lesion identified. Electronically Signed   By: MaNelson Chimes.D.   On: 03/20/2020 11:12     DG Chest 2 View  Result Date: 03/19/2020 CLINICAL DATA:  Shortness of breath. EXAM: CHEST - 2 VIEW COMPARISON:  February 25, 2020. FINDINGS: The heart size and mediastinal contours are within normal limits. No pneumothorax or  pleural effusion is noted. Mild bibasilar subsegmental atelectasis or infiltrates are noted. Small sliding-type hiatal hernia is noted. Status post kyphoplasty at 2 levels within the thoracic spine. IMPRESSION: Mild bibasilar subsegmental atelectasis or infiltrates are noted. Electronically Signed   By: Marijo Conception M.D.   On: 03/19/2020 09:14   CT Head W or Wo Contrast  Result Date: 03/19/2020 CLINICAL DATA:  History of skull base metastatic disease. Generalized weakness over the last 2 weeks. Metastatic breast cancer. EXAM: CT HEAD WITHOUT AND WITH CONTRAST TECHNIQUE: Contiguous  axial images were obtained from the base of the skull through the vertex without and with intravenous contrast CONTRAST:  166m OMNIPAQUE IOHEXOL 350 MG/ML SOLN COMPARISON:  Cervical MRI 10/24/2019. Bone scan 02/17/2020. Head CT 12/02/2019 FINDINGS: Brain: The brain has normal appearance without evidence of atrophy, old or recent infarction, mass lesion, hemorrhage, hydrocephalus or extra-axial collection. No abnormal contrast enhancement occurs. Vascular: No abnormal vascular finding. Skull: 13 x 9 x 9 mm sclerotic focus within the right side of the clivus without evidence lytic bone destruction or extraosseous tumor. This could be related to bony metastatic disease or could be a focus of fibrous dysplasia. No second regional lesion is identified. Sinuses/Orbits: Clear sinuses as seen. Limited orbital visualization is negative. Other: None IMPRESSION: 1. 13 x 9 x 9 mm sclerotic focus within the right side of the clivus without evidence of lytic bone destruction or extraosseous tumor. This could be related to bony metastatic disease or could be a focus of fibrous dysplasia. No second regional lesion is identified. 2. Normal appearance of the brain itself. Electronically Signed   By: MNelson ChimesM.D.   On: 03/19/2020 11:40   CT Angio Chest PE W and/or Wo Contrast  Result Date: 03/19/2020 CLINICAL DATA:  High probability for pulmonary embolism. Shortness of breath EXAM: CT ANGIOGRAPHY CHEST WITH CONTRAST TECHNIQUE: Multidetector CT imaging of the chest was performed using the standard protocol during bolus administration of intravenous contrast. Multiplanar CT image reconstructions and MIPs were obtained to evaluate the vascular anatomy. CONTRAST:  1047mOMNIPAQUE IOHEXOL 350 MG/ML SOLN COMPARISON:  06/04/2014 FINDINGS: Cardiovascular: Normal heart size. No pericardial effusion. No pulmonary artery filling defects. Normal aorta Mediastinum/Nodes: Large sliding hiatal hernia. Lungs/Pleura: Dependent streaky  opacity consistent with atelectasis. There is no edema, consolidation, effusion, or pneumothorax. Upper Abdomen: No acute finding Musculoskeletal: No acute finding. T6 and T10 compression fracture with advanced height loss at T10. Both of these levels have undergone cement augmentation. Multifocal vertebral metastatic disease with similar extent when compared to a thoracic MRI October 24, 2019. No evidence of acute fracture. Other: Intra and extracapsular breast implant rupture on the right, chronic when compared to 2016. Review of the MIP images confirms the above findings. IMPRESSION: 1. Negative for pulmonary embolism. 2. Low volume chest with atelectasis at the bases. 3. Known osseous metastatic disease.  No acute fracture. 4. Large sliding hiatal hernia. Electronically Signed   By: JoMonte Fantasia.D.   On: 03/19/2020 11:45   MR BRAIN W WO CONTRAST  Result Date: 03/20/2020 CLINICAL DATA:  Metastatic breast cancer. Evaluate clivus involvement. EXAM: MRI HEAD WITHOUT AND WITH CONTRAST TECHNIQUE: Multiplanar, multiecho pulse sequences of the brain and surrounding structures were obtained without and with intravenous contrast. CONTRAST:  38m37mADAVIST GADOBUTROL 1 MMOL/ML IV SOLN COMPARISON:  Head CT yesterday. Cervical MRI 10/24/2019. Bone scan 02/17/2020. Head CT 12/02/2019. FINDINGS: Brain: Diffusion imaging does not show any acute or subacute infarction. The brainstem and cerebellum are  normal. Cerebral hemispheres are normal. No evidence of metastatic disease to the brain or leptomeninges. There is some artifact related to previous temporomandibular joint region surgery on the left. No hemorrhage, hydrocephalus or extra-axial collection. No abnormal contrast enhancement. Vascular: Major vessels at the base of the brain show flow. Skull and upper cervical spine: As shown previously, there is a 13 mm region of diminished T1 and T2 signal in the right side of the clivus. This could be a metastasis or fibrous  dysplasia. No lytic destructive component. No extraosseous component. No second bone lesion identified. Sinuses/Orbits: Clear/normal Other: None IMPRESSION: 1. Normal appearance of the brain itself. No evidence of metastatic disease to the brain or leptomeninges. 2. 13 mm region of diminished T1 and T2 signal in the right side of the clivus. This could be a metastasis or fibrous dysplasia. No extraosseous component. No second bone lesion identified. Electronically Signed   By: Nelson Chimes M.D.   On: 03/20/2020 11:12    Pathology:  ACCESSION NUMBER: [id number]  RECEIVED: 11/12/2019  ORDERING PHYSICIAN: RAISA Vanita Panda , MD  PATIENT NAME: Caitlin Greene  SURGICAL PATHOLOGY REPORT   FINAL PATHOLOGIC DIAGNOSIS  MICROSCOPIC EXAMINATION AND DIAGNOSIS   A. BONE, "T6 VERTEBRA BIPEDICULAR", BIOPSY:  Metastatic adenocarcinoma, consistent with breast primary.  See comment.   B. BONE, "L2", BIOPSY:  Metastatic adenocarcinoma, consistent with breast primary.  See comment.   COMMENT:  Immunohistochemical stains performed on parts A and B show  metastatic tumors cells staining with an immunohistochemical  profile consistent with metastatic adenocarcinoma of breast  origin (see results below).   Cytokeratin AE1/AE3:Positive  Cytokeratin 7:Positive  Cytokeratin 20:Negative  GATA3:Positive  Controls:Satisfactory   Breast biomarkers (ER, PR, and HER2) have been requested on part  B; results will be issued in an addendum.   The case was reviewed by Dr. Sherrie George agrees with the  diagnostic interpretation of metastatic adenocarcinoma.    Prognostic Markers in Breast Cancer   Block Number: B1  History: Metastatic adenocarcinoma, consistent with a breast  primary    RESULTS:   Estrogen Receptor: Positive (90%, intermediate intensity)   Progesterone Receptor: Negative (<1%)   Her2: Negative (score 0)    Assessment and Plan:  This is a 54 year old female with: 1.  Metastatic breast cancer to the bones, ER positive/PR negative, HER-2 negative 2.  Nausea and vomiting possibly due to chemotherapy 3.  Generalized weakness 4.  Cancer related pain 5.  Dysphagia 6.  Anxiety 7.  Chronic vertigo  -Outside records and records from admission have been reviewed. The patient appears to have ER positive metastatic breast cancer to the bones.  She has been taking anastrozole and Ibrance and had been tolerating it well with exception of mild neutropenia.  Has now developed worsening fatigue, nausea, vomiting.  Symptoms may at least in part be due to to her chemotherapy drugs.  These have been placed on hold with improvement of her symptoms.  She is currently being treated at [facility], but wishes to transition her care to Dr. Marin Olp. -We will obtain additional work-up including an MRI of the brain with and without contrast, CA 27.29, and estradiol levels. -Further recommendations for treatment pending above work-up. -The patient will need to continue on Xgeva or other bisphosphonate as an outpatient for her bone metastases. -Continue outpatient pain medications and adjust as needed. -Speech therapy consult currently in process to evaluate dysphagia.  If no improvement, may need to consult GI. -Recommend dietitian consult this admission.  Thank you for this referral.   Mikey Bussing, DNP, AGPCNP-BC, AOCNP  ADDENDUM: I saw and examined Ms. Carol this morning.  She has metastatic breast cancer.  She obviously has estrogen positive disease as she is on antiestrogen therapy with Arimidex and Ibrance.  Is hard to say if her symptoms are from her treatment.  I see no problems with holding these for right now and seeing how she improves.  She does have a clivus lesion.  I will know if this might be causing some of the nausea and vomiting.  An MRI will certainly help.  I suspect she is probably going to  need radiation therapy for this.  We will see what her CA 27.29 is.  From the records that I have seen from [facility], the CA 27.29 has been coming down.  If the nausea and vomiting do not improve, she may need to have a upper endoscopy to see what might be going on in the stomach.  She understands that what she has is not curable.  She would just like to have good quality of life.  I think we can obtain this for her.  We may have to make some adjustments with her protocol.  We will follow her along.  Sometimes, Zyprexa can help with nausea and vomiting.  I appreciate all the great care that she will get once she is admitted.  I know she is getting fantastic care by all the staff in the ER.  Lattie Haw, MD  Romans 15:13

## 2020-03-20 NOTE — ED Notes (Signed)
Hourly rounding complete. Pt resting comfortably with bed in lowest position and side rails up x2. Vitals updated. Denies nausea or pain. All needs met at this time. Will continue to monitor.

## 2020-03-20 NOTE — ED Notes (Signed)
Hourly rounding complete. Pt resting comfortably with bed in lowest position and side rails up x2. Vitals updated. All needs met at this time. Will continue to monitor. 

## 2020-03-20 NOTE — ED Notes (Addendum)
ED TO INPATIENT HANDOFF REPORT  ED Nurse Name and Phone #: Fredonia Highland [phone]  S Name/Age/Gender Caitlin Greene 54 y.o. female Room/Bed: WA06/WA06  Code Status   Code Status: DNR  Home/SNF/Other Home Patient oriented to: self, place, time and situation Is this baseline? Yes   Triage Complete: Triage complete  Chief Complaint Generalized weakness [R53.1]  Triage Note Pt came from home via EMS. Pt reports generalized weakness for the past 2 wks, worsened today so EMS was called. Fire dept reported O2 86% on RA. EMS placed pt on 2L [place], weaned her off to RA, O2 at 100% on RA. PMH of stage 4 bone cancer in hips, head, back, and face. A&O x 4. Lives with sister.  CBG: 119     Allergies Allergies  Allergen Reactions  . Amoxicillin-Pot Clavulanate Hives  . Codeine Nausea And Vomiting    Needs pre-meds Needs pre-meds    Level of Care/Admitting Diagnosis ED Disposition    ED Disposition Condition Comment   Admit  Hospital Area: [facility] [[id number]]  Level of Care: Telemetry [5]  Admit to tele based on following criteria: Monitor QTC interval  Covid Evaluation: Asymptomatic Screening Protocol (No Symptoms)  Diagnosis: Generalized weakness [035009]  Admitting Physician: Harold Hedge [[id number]]  Attending Physician: Harold Hedge [[id number]]       B Medical/Surgery History Past Medical History:  Diagnosis Date  . ALLERGIC RHINITIS   . ANEMIA-IRON DEFICIENCY   . ANXIETY   . BREAST CANCER, HX OF 01/21/2007   at 54yo  . DEPRESSION   . GERD   . HYPERLIPIDEMIA   . RENAL CALCULUS    Past Surgical History:  Procedure Laterality Date  . BREAST ENHANCEMENT SURGERY    . MASTECTOMY     right  . TEMPOROMANDIBULAR JOINT SURGERY     Left  . TONSILLECTOMY       A IV Location/Drains/Wounds Patient Lines/Drains/Airways Status    Active Line/Drains/Airways    Name Placement date Placement time Site Days   Peripheral IV 03/19/20 Left;Posterior Hand  03/19/20  0836  Hand  1   Peripheral IV 03/19/20 Right Antecubital 03/19/20  1015  Antecubital  1          Intake/Output Last 24 hours  Intake/Output Summary (Last 24 hours) at 03/20/2020 1950 Last data filed at 03/20/2020 1841 Gross per 24 hour  Intake 2156.41 ml  Output 800 ml  Net 1356.41 ml    Labs/Imaging Results for orders placed or performed during the hospital encounter of 03/19/20 (from the past 48 hour(s))  CBC with Differential     Status: Abnormal   Collection Time: 03/19/20  8:19 AM  Result Value Ref Range   WBC 2.4 (L) 4.0 - 10.5 K/uL   RBC 3.75 (L) 3.87 - 5.11 MIL/uL   Hemoglobin 13.3 12.0 - 15.0 g/dL   HCT 37.2 36 - 46 %   MCV 99.2 80.0 - 100.0 fL   MCH 35.5 (H) 26.0 - 34.0 pg   MCHC 35.8 30.0 - 36.0 g/dL   RDW 19.7 (H) 11.5 - 15.5 %   Platelets 206 150 - 400 K/uL   nRBC 0.0 0.0 - 0.2 %   Neutrophils Relative % 51 %   Neutro Abs 1.2 (L) 1.7 - 7.7 K/uL   Lymphocytes Relative 40 %   Lymphs Abs 1.0 0.7 - 4.0 K/uL   Monocytes Relative 5 %   Monocytes Absolute 0.1 0.1 - 1.0 K/uL   Eosinophils Relative 1 %  Eosinophils Absolute 0.0 0.0 - 0.5 K/uL   Basophils Relative 3 %   Basophils Absolute 0.1 0.0 - 0.1 K/uL   Immature Granulocytes 0 %   Abs Immature Granulocytes 0.01 0.00 - 0.07 K/uL   Reactive, Benign Lymphocytes PRESENT     Comment: Performed at [facility], [place] [address], [place], [place] [address]  Troponin I (High Sensitivity)     Status: None   Collection Time: 03/19/20  8:19 AM  Result Value Ref Range   Troponin I (High Sensitivity) <2 <18 ng/L    Comment: (NOTE) Elevated high sensitivity troponin I (hsTnI) values and significant  changes across serial measurements may suggest ACS but many other  chronic and acute conditions are known to elevate hsTnI results.  Refer to the "Links" section for chest pain algorithms and additional  guidance. Performed at [facility], [place] [address] [place],  [place] [address]   Brain natriuretic peptide     Status: None   Collection Time: 03/19/20  8:20 AM  Result Value Ref Range   B Natriuretic Peptide 19.8 0.0 - 100.0 pg/mL    Comment: Performed at [facility], [place] [address], [place], [place] [address]  Respiratory Panel by RT PCR (Flu A&B, Covid) - Nasopharyngeal Swab     Status: None   Collection Time: 03/19/20  9:10 AM   Specimen: Nasopharyngeal Swab  Result Value Ref Range   SARS Coronavirus 2 by RT PCR NEGATIVE NEGATIVE    Comment: (NOTE) SARS-CoV-2 target nucleic acids are NOT DETECTED.  The SARS-CoV-2 RNA is generally detectable in upper respiratoy specimens during the acute phase of infection. The lowest concentration of SARS-CoV-2 viral copies this assay can detect is 131 copies/mL. A negative result does not preclude SARS-Cov-2 infection and should not be used as the sole basis for treatment or other patient management decisions. A negative result may occur with  improper specimen collection/handling, submission of specimen other than nasopharyngeal swab, presence of viral mutation(s) within the areas targeted by this assay, and inadequate number of viral copies (<131 copies/mL). A negative result must be combined with clinical observations, patient history, and epidemiological information. The expected result is Negative.  Fact Sheet for Patients:  PinkCheek.be  Fact Sheet for Healthcare Providers:  GravelBags.it  This test is no t yet approved or cleared by the [place] FDA and  has been authorized for detection and/or diagnosis of SARS-CoV-2 by FDA under an Emergency Use Authorization (EUA). This EUA will remain  in effect (meaning this test can be used) for the duration of the COVID-19 declaration under Section 564(b)(1) of the Act, 21 U.S.C. section 360bbb-3(b)(1), unless the authorization is terminated or revoked sooner.     Influenza A  by PCR NEGATIVE NEGATIVE   Influenza B by PCR NEGATIVE NEGATIVE    Comment: (NOTE) The Xpert Xpress SARS-CoV-2/FLU/RSV assay is intended as an aid in  the diagnosis of influenza from Nasopharyngeal swab specimens and  should not be used as a sole basis for treatment. Nasal washings and  aspirates are unacceptable for Xpert Xpress SARS-CoV-2/FLU/RSV  testing.  Fact Sheet for Patients: PinkCheek.be  Fact Sheet for Healthcare Providers: GravelBags.it  This test is not yet approved or cleared by the [place] FDA and  has been authorized for detection and/or diagnosis of SARS-CoV-2 by  FDA under an Emergency Use Authorization (EUA). This EUA will remain  in effect (meaning this test can be used) for the duration of the  Covid-19 declaration under Section 564(b)(1) of the Act, 21  U.S.C. section 360bbb-3(b)(1), unless the authorization is  terminated or revoked. Performed at [facility], [place] [address], [place], [place] [address]   Comprehensive metabolic panel     Status: Abnormal   Collection Time: 03/19/20  9:10 AM  Result Value Ref Range   Sodium 136 135 - 145 mmol/L   Potassium 3.4 (L) 3.5 - 5.1 mmol/L   Chloride 105 98 - 111 mmol/L   CO2 19 (L) 22 - 32 mmol/L   Glucose, Bld 103 (H) 70 - 99 mg/dL    Comment: Glucose reference range applies only to samples taken after fasting for at least 8 hours.   BUN 14 6 - 20 mg/dL   Creatinine, Ser 0.79 0.44 - 1.00 mg/dL   Calcium 8.9 8.9 - 10.3 mg/dL   Total Protein 8.0 6.5 - 8.1 g/dL   Albumin 4.4 3.5 - 5.0 g/dL   AST 24 15 - 41 U/L   ALT 23 0 - 44 U/L   Alkaline Phosphatase 46 38 - 126 U/L   Total Bilirubin 1.2 0.3 - 1.2 mg/dL   GFR, Estimated >60 >60 mL/min    Comment: (NOTE) Calculated using the CKD-EPI Creatinine Equation (2021)    Anion gap 12 5 - 15    Comment: Performed at [facility], [place] [address], [place], [place]  [address]  Comprehensive metabolic panel     Status: Abnormal   Collection Time: 03/20/20  2:37 AM  Result Value Ref Range   Sodium 139 135 - 145 mmol/L   Potassium 3.7 3.5 - 5.1 mmol/L   Chloride 110 98 - 111 mmol/L   CO2 23 22 - 32 mmol/L   Glucose, Bld 71 70 - 99 mg/dL    Comment: Glucose reference range applies only to samples taken after fasting for at least 8 hours.   BUN 7 6 - 20 mg/dL   Creatinine, Ser 0.63 0.44 - 1.00 mg/dL   Calcium 7.5 (L) 8.9 - 10.3 mg/dL   Total Protein 5.9 (L) 6.5 - 8.1 g/dL   Albumin 3.2 (L) 3.5 - 5.0 g/dL   AST 14 (L) 15 - 41 U/L   ALT 18 0 - 44 U/L   Alkaline Phosphatase 34 (L) 38 - 126 U/L   Total Bilirubin 0.8 0.3 - 1.2 mg/dL   GFR, Estimated >60 >60 mL/min    Comment: (NOTE) Calculated using the CKD-EPI Creatinine Equation (2021)    Anion gap 6 5 - 15    Comment: Performed at [facility], [place] [address] [place], [place] [address]  Troponin I (High Sensitivity)     Status: None   Collection Time: 03/20/20  2:40 AM  Result Value Ref Range   Troponin I (High Sensitivity) <2 <18 ng/L    Comment: (NOTE) Elevated high sensitivity troponin I (hsTnI) values and significant  changes across serial measurements may suggest ACS but many other  chronic and acute conditions are known to elevate hsTnI results.  Refer to the "Links" section for chest pain algorithms and additional  guidance. Performed at [facility], [place] [address], [place],  [address]   HIV Antibody (routine testing w rflx)     Status: None   Collection Time: 03/20/20  2:40 AM  Result Value Ref Range   HIV Screen 4th Generation wRfx Non Reactive Non Reactive    Comment: Performed at [facility], [place] [address]  [place], [place] [address]  CBC     Status: Abnormal   Collection Time: 03/20/20  2:40 AM  Result Value Ref Range   WBC 2.6 (L) 4.0 - 10.5 K/uL   RBC 2.93 (L) 3.87 - 5.11 MIL/uL   Hemoglobin 10.4 (L) 12.0 - 15.0 g/dL   HCT  30.5 (L) 36 - 46 %   MCV 104.1 (H) 80.0 - 100.0 fL   MCH 35.5 (H) 26.0 - 34.0 pg   MCHC 34.1 30.0 - 36.0 g/dL   RDW 20.4 (H) 11.5 - 15.5 %   Platelets 143 (L) 150 - 400 K/uL   nRBC 0.0 0.0 - 0.2 %    Comment: Performed at [facility], [place] [address], [place], [place] [address]  Creatinine, serum     Status: None   Collection Time: 03/20/20  2:40 AM  Result Value Ref Range   Creatinine, Ser 0.58 0.44 - 1.00 mg/dL   GFR, Estimated >60 >60 mL/min    Comment: (NOTE) Calculated using the CKD-EPI Creatinine Equation (2021) Performed at [facility], [place] [address] [place], [place] [address]    DG Chest 2 View  Result Date: 03/19/2020 CLINICAL DATA:  Shortness of breath. EXAM: CHEST - 2 VIEW COMPARISON:  February 25, 2020. FINDINGS: The heart size and mediastinal contours are within normal limits. No pneumothorax or pleural effusion is noted. Mild bibasilar subsegmental atelectasis or infiltrates are noted. Small sliding-type hiatal hernia is noted. Status post kyphoplasty at 2 levels within the thoracic spine. IMPRESSION: Mild bibasilar subsegmental atelectasis or infiltrates are noted. Electronically Signed   By: Marijo Conception M.D.   On: 03/19/2020 09:14   CT Head W or Wo Contrast  Result Date: 03/19/2020 CLINICAL DATA:  History of skull base metastatic disease. Generalized weakness over the last 2 weeks. Metastatic breast cancer. EXAM: CT HEAD WITHOUT AND WITH CONTRAST TECHNIQUE: Contiguous axial images were obtained from the base of the skull through the vertex without and with intravenous contrast CONTRAST:  128mL OMNIPAQUE IOHEXOL 350 MG/ML SOLN COMPARISON:  Cervical MRI 10/24/2019. Bone scan 02/17/2020. Head CT 12/02/2019 FINDINGS: Brain: The brain has normal appearance without evidence of atrophy, old or recent infarction, mass lesion, hemorrhage, hydrocephalus or extra-axial collection. No abnormal contrast enhancement occurs. Vascular: No  abnormal vascular finding. Skull: 13 x 9 x 9 mm sclerotic focus within the right side of the clivus without evidence lytic bone destruction or extraosseous tumor. This could be related to bony metastatic disease or could be a focus of fibrous dysplasia. No second regional lesion is identified. Sinuses/Orbits: Clear sinuses as seen. Limited orbital visualization is negative. Other: None IMPRESSION: 1. 13 x 9 x 9 mm sclerotic focus within the right side of the clivus without evidence of lytic bone destruction or extraosseous tumor. This could be related to bony metastatic disease or could be a focus of fibrous dysplasia. No second regional lesion is identified. 2. Normal appearance of the brain itself. Electronically Signed   By: Nelson Chimes M.D.   On: 03/19/2020 11:40   CT Angio Chest PE W and/or Wo Contrast  Result Date: 03/19/2020 CLINICAL DATA:  High probability for pulmonary embolism. Shortness of breath EXAM: CT ANGIOGRAPHY CHEST WITH CONTRAST TECHNIQUE: Multidetector CT imaging of the chest was performed using the standard protocol during bolus administration of intravenous contrast. Multiplanar CT image reconstructions and MIPs were obtained to evaluate the vascular anatomy. CONTRAST:  155mL OMNIPAQUE IOHEXOL 350 MG/ML SOLN COMPARISON:  06/04/2014 FINDINGS: Cardiovascular: Normal  heart size. No pericardial effusion. No pulmonary artery filling defects. Normal aorta Mediastinum/Nodes: Large sliding hiatal hernia. Lungs/Pleura: Dependent streaky opacity consistent with atelectasis. There is no edema, consolidation, effusion, or pneumothorax. Upper Abdomen: No acute finding Musculoskeletal: No acute finding. T6 and T10 compression fracture with advanced height loss at T10. Both of these levels have undergone cement augmentation. Multifocal vertebral metastatic disease with similar extent when compared to a thoracic MRI October 24, 2019. No evidence of acute fracture. Other: Intra and extracapsular breast  implant rupture on the right, chronic when compared to 2016. Review of the MIP images confirms the above findings. IMPRESSION: 1. Negative for pulmonary embolism. 2. Low volume chest with atelectasis at the bases. 3. Known osseous metastatic disease.  No acute fracture. 4. Large sliding hiatal hernia. Electronically Signed   By: Monte Fantasia M.D.   On: 03/19/2020 11:45   MR BRAIN W WO CONTRAST  Result Date: 03/20/2020 CLINICAL DATA:  Metastatic breast cancer. Evaluate clivus involvement. EXAM: MRI HEAD WITHOUT AND WITH CONTRAST TECHNIQUE: Multiplanar, multiecho pulse sequences of the brain and surrounding structures were obtained without and with intravenous contrast. CONTRAST:  21mL GADAVIST GADOBUTROL 1 MMOL/ML IV SOLN COMPARISON:  Head CT yesterday. Cervical MRI 10/24/2019. Bone scan 02/17/2020. Head CT 12/02/2019. FINDINGS: Brain: Diffusion imaging does not show any acute or subacute infarction. The brainstem and cerebellum are normal. Cerebral hemispheres are normal. No evidence of metastatic disease to the brain or leptomeninges. There is some artifact related to previous temporomandibular joint region surgery on the left. No hemorrhage, hydrocephalus or extra-axial collection. No abnormal contrast enhancement. Vascular: Major vessels at the base of the brain show flow. Skull and upper cervical spine: As shown previously, there is a 13 mm region of diminished T1 and T2 signal in the right side of the clivus. This could be a metastasis or fibrous dysplasia. No lytic destructive component. No extraosseous component. No second bone lesion identified. Sinuses/Orbits: Clear/normal Other: None IMPRESSION: 1. Normal appearance of the brain itself. No evidence of metastatic disease to the brain or leptomeninges. 2. 13 mm region of diminished T1 and T2 signal in the right side of the clivus. This could be a metastasis or fibrous dysplasia. No extraosseous component. No second bone lesion identified.  Electronically Signed   By: Nelson Chimes M.D.   On: 03/20/2020 11:12   DG ESOPHAGUS W SINGLE CM (SOL OR THIN BA)  Result Date: 03/20/2020 CLINICAL DATA:  54 year old female with history of globus sensation while ingesting solid food for the past 2-3 weeks. History of gastroesophageal reflux disease. EXAM: ESOPHOGRAM / BARIUM SWALLOW / BARIUM TABLET STUDY TECHNIQUE: Combined double contrast and single contrast examination performed using effervescent crystals, thick barium liquid, and thin barium liquid. The patient was observed with fluoroscopy swallowing a 13 mm barium sulphate tablet. FLUOROSCOPY TIME:  Fluoroscopy Time:  1 minutes and 36 seconds Radiation Exposure Index (if provided by the fluoroscopic device): 12.5 mGy COMPARISON:  None. FINDINGS: Double contrast images of the esophagus demonstrate a normal appearance of the esophageal mucosa. Multiple single swallow attempts were observed which demonstrated normal esophageal motility. Full column esophagram demonstrated no esophageal mass, stricture or esophageal ring. Large hiatal hernia. A barium tablet was administered, which passed readily into the stomach. IMPRESSION: 1. Large hiatal hernia. 2. Normal esophageal motility. Electronically Signed   By: [name] M.D.   On: 03/20/2020 15:46    Pending Labs [employer] (From admission, onward)          Start  Ordered   03/26/20 0500  Creatinine, serum  (enoxaparin (LOVENOX)    CrCl >/= 30 ml/min)  Weekly,   R     Comments: while on enoxaparin therapy    03/19/20 1526   03/21/20 0500  CBC with Differential/Platelet  Tomorrow morning,   R        03/20/20 1808   03/21/20 8299  Basic metabolic panel  Tomorrow morning,   R        03/20/20 1808   03/21/20 0500  Magnesium  Tomorrow morning,   STAT        03/20/20 1808   03/21/20 0500  TSH  Tomorrow morning,   R        03/20/20 1808   03/21/20 0500  Cortisol  Tomorrow morning,   R        03/20/20 1808   03/21/20 0500  Vitamin  B12  Tomorrow morning,   R        03/20/20 1818   03/21/20 0500  Reticulocytes  Tomorrow morning,   R        03/20/20 1818   03/21/20 0500  Iron and TIBC  Tomorrow morning,   R        03/20/20 1818   03/21/20 0500  Ferritin  Tomorrow morning,   R        03/20/20 1818   03/19/20 1731  Estradiol  Once,   STAT        03/19/20 1730   03/19/20 1714  Cancer antigen 27.29  Once,   STAT        03/19/20 1730   Unscheduled  Occult blood card to lab, stool  As needed,   R      03/20/20 1817          Vitals/Pain Today's Vitals   03/20/20 1745 03/20/20 1800 03/20/20 1815 03/20/20 1830  BP: 110/86 (!) 114/93 113/80 100/71  Pulse: (!) 111 (!) 117 (!) 102 92  Resp: 16 (!) 28 17 15   Temp:      TempSrc:      SpO2: 98% 100% 100% 100%  Weight:      Height:      PainSc:        Isolation Precautions No active isolations  Medications Medications  methadone (DOLOPHINE) tablet 5 mg (5 mg Oral Given 03/19/20 2109)  pantoprazole (PROTONIX) EC tablet 40 mg (40 mg Oral Given 03/20/20 1837)  acetaminophen (TYLENOL) tablet 650 mg (has no administration in time range)    Or  acetaminophen (TYLENOL) suppository 650 mg (has no administration in time range)  enoxaparin (LOVENOX) injection 40 mg (40 mg Subcutaneous Given 03/20/20 1642)  sodium chloride flush (NS) 0.9 % injection 3 mL (3 mLs Intravenous Given 03/20/20 1129)  lactated ringers infusion ( Intravenous Stopped 03/20/20 1131)  HYDROmorphone (DILAUDID) injection 0.5 mg (0.5 mg Intravenous Given 03/20/20 0236)  ondansetron (ZOFRAN) tablet 4 mg ( Oral See Alternative 03/20/20 0235)    Or  ondansetron (ZOFRAN) injection 4 mg (4 mg Intravenous Given 03/20/20 0235)  oxyCODONE (OXYCONTIN) 12 hr tablet 10 mg (10 mg Oral Given 03/20/20 1124)    And  oxyCODONE (OXYCONTIN) 12 hr tablet 20 mg (20 mg Oral Given 03/20/20 1837)  dronabinol (MARINOL) capsule 5 mg (5 mg Oral Given 03/20/20 1146)  LORazepam (ATIVAN) injection 1 mg (1 mg Intravenous Not  Given 03/20/20 1125)  senna-docusate (Senokot-S) tablet 1 tablet (has no administration in time range)  polyethylene glycol (MIRALAX / GLYCOLAX) packet 17 g (  has no administration in time range)  iohexol (OMNIPAQUE) 350 MG/ML injection 100 mL (100 mLs Intravenous Contrast Given 03/19/20 1045)  sodium chloride 0.9 % bolus 500 mL (0 mLs Intravenous Stopped 03/19/20 1312)  ondansetron (ZOFRAN) injection 4 mg (4 mg Intravenous Given 03/19/20 1225)  promethazine (PHENERGAN) injection 12.5 mg (12.5 mg Intravenous Given 03/19/20 1337)  LORazepam (ATIVAN) tablet 1 mg (1 mg Oral Given 03/20/20 0824)  gadobutrol (GADAVIST) 1 MMOL/ML injection 9 mL (9 mLs Intravenous Contrast Given 03/20/20 1051)  lactated ringers bolus 1,000 mL (0 mLs Intravenous Stopped 03/20/20 1841)    Mobility walks with person assist Low fall risk   Focused Assessments gen med   R Recommendations: See Admitting Provider Note  Report given to: Jayden RN  Additional Notes:

## 2020-03-20 NOTE — Evaluation (Signed)
Physical Therapy Evaluation Patient Details Name: Caitlin Greene MRN: [id number] DOB: [birth year] Today's Date: 03/20/2020   History of Present Illness  54 year old female with PMH of DCIS diagnosed 30 years prior s/p right mastectomy recently found to have metastases to bone and possibly lung, GERD, chronic vertigo, anxiety and depression, hyperlipidemia who presented to the ED with generalized weakness, nausea, vomiting and shortness of breath for the past couple weeks.    Clinical Impression  Pt admitted with above diagnosis. Pt with increased pain with mobility, able to come to sitting EOB and tolerates standing. Pt standing at bedside with BLE braced against bed, HR increase to 139bpm without decrease and pt reports "racey" sensation so returned to sitting and symptoms resolved. PTA, pt c/o dizziness limiting ambulation and with transitions so plan to continue assessing dizziness with mobility. Pt currently with functional limitations due to the deficits listed below (see PT Problem List). Pt will benefit from skilled PT to increase their independence and safety with mobility to allow discharge to the venue listed below.       Follow Up Recommendations No PT follow up    Equipment Recommendations  None recommended by PT    Recommendations for Other Services       Precautions / Restrictions Precautions Precautions: Other (comment);Fall Precaution Comments: monitor HR Restrictions Weight Bearing Restrictions: No      Mobility  Bed Mobility Overal bed mobility: Needs Assistance Bed Mobility: Supine to Sit;Sit to Supine  Supine to sit: Supervision Sit to supine: Supervision   General bed mobility comments: increased time to come to sitting EOB due to pain    Transfers Overall transfer level: Needs assistance Equipment used: Rolling walker (2 wheeled) Transfers: Sit to/from Stand Sit to Stand: Supervision  General transfer comment: back of BLE braced on front of bed,  therapist close SUPV for steadying and safety, HR 139 max with "racey" complaints so returned to sitting with sensation resolving  Ambulation/Gait  General Gait Details: NT due to HR with static standing  Stairs            Wheelchair Mobility    Modified Rankin (Stroke Patients Only)       Balance Overall balance assessment: Needs assistance Sitting-balance support: Feet supported Sitting balance-Leahy Scale: Good Sitting balance - Comments: seated EOB   Standing balance support: During functional activity;No upper extremity supported Standing balance-Leahy Scale: Fair Standing balance comment: static standing with close SUPV          Pertinent Vitals/Pain Pain Assessment: 0-10 Pain Score: 5  Faces Pain Scale: Hurts a little bit Pain Location: ribs and back Pain Descriptors / Indicators: Discomfort;Aching Pain Intervention(s): Limited activity within patient's tolerance;Monitored during session;Premedicated before session;Repositioned    Home Living Family/patient expects to be discharged to:: Private residence Living Arrangements: Parent;Other relatives (lives with sister and dad) Available Help at Discharge: Family;Available PRN/intermittently Type of Home: House Home Access: Level entry     Home Layout: One level Home Equipment: Shower seat      Prior Function Level of Independence: Independent         Comments: Pt reports independent with community ambulation without AD but does get dizzy from vertigo so she isn't as active as she would like. Pt reports she is independent with ADLs. Pt currenly living with father because she has multiple steps to get into her home; pt can stay with dad and sister as long as pt wants.     Hand Dominance  Extremity/Trunk Assessment   Upper Extremity Assessment Upper Extremity Assessment: Overall WFL for tasks assessed    Lower Extremity Assessment Lower Extremity Assessment: Overall WFL for tasks  assessed (AROM WNL and strength 3+/5 throughout)    Cervical / Trunk Assessment Cervical / Trunk Assessment: Normal  Communication   Communication: No difficulties  Cognition Arousal/Alertness: Awake/alert Behavior During Therapy: WFL for tasks assessed/performed Overall Cognitive Status: Within Functional Limits for tasks assessed                 General Comments General comments (skin integrity, edema, etc.): HR max 139 bpm with static standing    Exercises     Assessment/Plan    PT Assessment Patient needs continued PT services  PT Problem List Decreased strength;Decreased activity tolerance;Decreased balance;Decreased mobility;Pain       PT Treatment Interventions Gait training;Functional mobility training;Therapeutic activities;Therapeutic exercise;Balance training;Patient/family education    PT Goals (Current goals can be found in the Care Plan section)  Acute Rehab PT Goals Patient Stated Goal: less pain PT Goal Formulation: With patient Time For Goal Achievement: 04/03/20 Potential to Achieve Goals: Good    Frequency Min 3X/week   Barriers to discharge        Co-evaluation               AM-PAC PT "6 Clicks" Mobility  Outcome Measure Help needed turning from your back to your side while in a flat bed without using bedrails?: None Help needed moving from lying on your back to sitting on the side of a flat bed without using bedrails?: None Help needed moving to and from a bed to a chair (including a wheelchair)?: A Little Help needed standing up from a chair using your arms (e.g., wheelchair or bedside chair)?: None Help needed to walk in hospital room?: A Little Help needed climbing 3-5 steps with a railing? : A Little 6 Click Score: 21    End of Session   Activity Tolerance: Patient limited by pain Patient left: in bed;with call bell/phone within reach;Other (comment) (SLP in room for speech eval) Nurse Communication: Mobility status;Other  (comment) (RN: HR with static stand, NT: purewick) PT Visit Diagnosis: Unsteadiness on feet (R26.81);Other abnormalities of gait and mobility (R26.89)    Time: 2820-6015 PT Time Calculation (min) (ACUTE ONLY): 13 min   Charges:   PT Evaluation $PT Eval Moderate Complexity: 1 Mod          Tori Chelli Yerkes PT, DPT 03/20/20, 12:46 PM

## 2020-03-20 NOTE — Evaluation (Addendum)
Clinical/Bedside Swallow Evaluation Patient Details  Name: Delphia Kaylor MRN: [id number] Date of Birth: 1966-04-05  Today's Date: 03/20/2020 Time: SLP Start Time (ACUTE ONLY): 1215 SLP Stop Time (ACUTE ONLY): 1235 SLP Time Calculation (min) (ACUTE ONLY): 20 min  Past Medical History:  Past Medical History:  Diagnosis Date  . ALLERGIC RHINITIS   . ANEMIA-IRON DEFICIENCY   . ANXIETY   . BREAST CANCER, HX OF 01/21/2007   at 54yo  . DEPRESSION   . GERD   . HYPERLIPIDEMIA   . RENAL CALCULUS    Past Surgical History:  Past Surgical History:  Procedure Laterality Date  . BREAST ENHANCEMENT SURGERY    . MASTECTOMY     right  . TEMPOROMANDIBULAR JOINT SURGERY     Left  . TONSILLECTOMY     HPI:  54yo female admitted 03/19/20 with weakness, SOB, n/v. PMH: DCIS s/p right mastectomy with mets to bone (hips, head, back, face) and possibly lung, GERD, chronic vertigo, anxiety, depression, HLD. CXR = mild BLL subsegmental atelectasis/infiltrate.   Assessment / Plan / Recommendation Clinical Impression  Pt seen at bedside for evaluation of swallow function and safety. Suspect primary esophageal dysphagia. Pt reports globus sensation with solids for the past 2-3 weeks. She does have a history of GERD.   Oral motor strength and function are adequate. CN exam unremarkable. Pt has natural dentition. No overt s/s aspiration observed with thin liquids, puree, or solid textures. However, pt did report globus sensation with sandwich. SLP provided broth, which pt reported was helpful to eliminate globus sensation. Recommend advancing diet to regular/thin liquids with whole meds given with liquid. Also recommend consideration of a regular barium swallow to evaluate esophageal motility. SLP provided written strategies for managment of esophageal dysmotility and reviewed these briefly with pt. SLP will follow up at bedside for education.   Behavioral and Dietary Strategies for Management of Esophageal  Dysmotility 1. Take reflux medications 30+ minutes before other po intake, in the morning 2. Begin meals with warm beverage 3. Eat smaller more frequent meals 4. Eat slowly, taking small bites and sips 5. Alternate solids and liquids 6. Avoid foods/liquids that increase acid production 7. Sit upright during and for 30+ minutes after meals to facilitate esophageal clearing   SLP Visit Diagnosis: Dysphagia, unspecified (R13.10)    Aspiration Risk  Mild aspiration risk    Diet Recommendation Regular;Thin liquid   Liquid Administration via: Cup;Straw Medication Administration: Whole meds with liquid Supervision: Patient able to self feed Compensations: Minimize environmental distractions;Slow rate;Small sips/bites Postural Changes: Remain upright for at least 30 minutes after po intake;Seated upright at 90 degrees    Other  Recommendations Recommended Consults: Consider esophageal assessment Oral Care Recommendations: Oral care BID   Follow up Recommendations None      Frequency and Duration min 1 x/week  1 week       Prognosis Prognosis for Safe Diet Advancement: Good      Swallow Study   General Date of Onset: 03/19/20 HPI: 55yo female admitted 03/19/20 with weakness, SOB, n/v. PMH: DCIS s/p right mastectomy with mets to bone (hips, head, back, face) and possibly lung, GERD, chronic vertigo, anxiety, depression, HLD. CXR = mild BLL subsegmental atelectasis/infiltrate. Type of Study: Bedside Swallow Evaluation Previous Swallow Assessment: none found Diet Prior to this Study: Thin liquids Temperature Spikes Noted: No Respiratory Status: Room air History of Recent Intubation: No Behavior/Cognition: Alert;Cooperative;Pleasant mood Oral Cavity Assessment: Within Functional Limits Oral Care Completed by SLP: No Oral Cavity - Dentition:  Adequate natural dentition Vision: Functional for self-feeding Self-Feeding Abilities: Able to feed self Patient Positioning: Upright in  bed Baseline Vocal Quality: Normal Volitional Cough: Strong Volitional Swallow: Able to elicit    Oral/Motor/Sensory Function Overall Oral Motor/Sensory Function: Within functional limits      Thin Liquid Thin Liquid: Within functional limits Presentation: Cup;Straw    Puree Puree: Within functional limits Presentation: Self Fed   Solid     Solid: Impaired Presentation: Self Fed Other Comments: Globus sensation. Pt reports drinking warm liquid was helpful.     Desarie Feild B. Quentin Ore, [facility], [place] Speech Language Pathologist Office: [phone]  Shonna Chock 03/20/2020,12:43 PM

## 2020-03-20 NOTE — ED Notes (Signed)
Pt on hospital bed 

## 2020-03-20 NOTE — Progress Notes (Signed)
PROGRESS NOTE    Caitlin Greene  GUY:[id number] DOB: 11-27-1965 DOA: 03/19/2020 PCP: Biagio Borg, MD    Chief Complaint  Patient presents with  . Weakness  . 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 s/p right mastectomy recently found to have metastases to bone and possibly lung on anastrozole and Ibrance follows at [facility], GERD, chronic vertigo, anxiety and depression, hyperlipidemia who presented to the ED with concern for generalized weakness, nausea, vomiting and shortness of breath for the past couple weeks  Subjective:  She has been going to radiology for several imaging study today, no vomiting today, but remain very poor oral intake, only eat 10% of her tray  Assessment & Plan:   Principal Problem:   Nausea & vomiting Active Problems:   Generalized weakness   Metastatic breast cancer ([place])   Cancer related pain   Prolonged QT interval   Dysphagia   Intractable nausea and vomiting ( started to have intermittent n/v since June this year, but became constant in the last 10 days), dehydration, generalized weakness -Anastrozole and Ibrance stopped due to possible side effect -also has intermittent vertigo -No oropharyngeal dysphagia per speech eval , EGD esophagus ordered to evaluate esophageal phase issue, may need GI involvement pending result -Appear dehydrated, with poor oral intake, give one liter fluids bolus, then start d5LR infusion -will consult nutrition consult as well -reports 55lbs weight loss since June, she would like to talk to GI, will keep her npo aftermidnight In case procedure.   Neutropenia -Possible cancer treatment related, anastrozole and Ibrance on hold  Microcytic anemia -Appear new,  -tbilin wnl -check B12 level, TSH level, iron study, retic count -Does not appear to have overt signs of blood loss, check F OBT  Prolonged QTC QTC 491 Avoid QT prolongation  agent, keep potassium above 4, magnesium above 2 Repeat EKG in the morning  Metastatic breast cancer/cancer related pain -Management per oncology   Body mass index is 31.31 kg/m.      DVT prophylaxis: enoxaparin (LOVENOX) injection 40 mg Start: 03/19/20 1600   Code Status:DNR Family Communication: patient Disposition:   Status is: Observation  Dispo: The patient is from: home              Anticipated d/c is to: home              Anticipated d/c date is: TBD, pending symptom involvement, may need gi consult                Consultants:   oncology  Procedures:   none  Antimicrobials:    none    Objective: Vitals:   03/20/20 1430 03/20/20 1445 03/20/20 1615 03/20/20 1630  BP: 98/64  107/77 106/73  Pulse: (!) 101 (!) 107 (!) 101 88  Resp: 16 (!) 35 19 14  Temp:      TempSrc:      SpO2: 94% 94% 99% 99%  Weight:      Height:        Intake/Output Summary (Last 24 hours) at 03/20/2020 1810 Last data filed at 03/20/2020 1131 Gross per 24 hour  Intake 1402.49 ml  Output 800 ml  Net 602.49 ml   Filed Weights   03/19/20 0816  Weight: 93.4 kg    Examination:  General exam: calm, NAD Respiratory system: Clear to auscultation. Respiratory effort normal. Cardiovascular system: S1 & S2 heard, RRR. No  pedal edema. Gastrointestinal system: Abdomen is nondistended, soft and nontender. Normal bowel sounds heard. Central nervous system: Alert and oriented. No focal neurological deficits. Extremities: Symmetric 5 x 5 power. Skin: No rashes, lesions or ulcers Psychiatry: Judgement and insight appear normal. Mood & affect appropriate.     Data Reviewed: I have personally reviewed following labs and imaging studies  CBC: Recent Labs  Lab 03/19/20 0819 03/20/20 0240  WBC 2.4* 2.6*  NEUTROABS 1.2*  --   HGB 13.3 10.4*  HCT 37.2 30.5*  MCV 99.2 104.1*  PLT 206 143*    Basic Metabolic Panel: Recent Labs  Lab 03/19/20 0910 03/20/20 0237 03/20/20 0240   NA 136 139  --   K 3.4* 3.7  --   CL 105 110  --   CO2 19* 23  --   GLUCOSE 103* 71  --   BUN 14 7  --   CREATININE 0.79 0.63 0.58  CALCIUM 8.9 7.5*  --     GFR: Estimated Creatinine Clearance: 93.8 mL/min (by C-G formula based on SCr of 0.58 mg/dL).  Liver Function Tests: Recent Labs  Lab 03/19/20 0910 03/20/20 0237  AST 24 14*  ALT 23 18  ALKPHOS 46 34*  BILITOT 1.2 0.8  PROT 8.0 5.9*  ALBUMIN 4.4 3.2*    CBG: No results for input(s): GLUCAP in the last 168 hours.   Recent Results (from the past 240 hour(s))  Respiratory Panel by RT PCR (Flu A&B, Covid) - Nasopharyngeal Swab     Status: None   Collection Time: 03/19/20  9:10 AM   Specimen: Nasopharyngeal Swab  Result Value Ref Range Status   SARS Coronavirus 2 by RT PCR NEGATIVE NEGATIVE Final    Comment: (NOTE) SARS-CoV-2 target nucleic acids are NOT DETECTED.  The SARS-CoV-2 RNA is generally detectable in upper respiratoy specimens during the acute phase of infection. The lowest concentration of SARS-CoV-2 viral copies this assay can detect is 131 copies/mL. A negative result does not preclude SARS-Cov-2 infection and should not be used as the sole basis for treatment or other patient management decisions. A negative result may occur with  improper specimen collection/handling, submission of specimen other than nasopharyngeal swab, presence of viral mutation(s) within the areas targeted by this assay, and inadequate number of viral copies (<131 copies/mL). A negative result must be combined with clinical observations, patient history, and epidemiological information. The expected result is Negative.  Fact Sheet for Patients:  PinkCheek.be  Fact Sheet for Healthcare Providers:  GravelBags.it  This test is no t yet approved or cleared by the [place] FDA and  has been authorized for detection and/or diagnosis of SARS-CoV-2 by FDA under an  Emergency Use Authorization (EUA). This EUA will remain  in effect (meaning this test can be used) for the duration of the COVID-19 declaration under Section 564(b)(1) of the Act, 21 U.S.C. section 360bbb-3(b)(1), unless the authorization is terminated or revoked sooner.     Influenza A by PCR NEGATIVE NEGATIVE Final   Influenza B by PCR NEGATIVE NEGATIVE Final    Comment: (NOTE) The Xpert Xpress SARS-CoV-2/FLU/RSV assay is intended as an aid in  the diagnosis of influenza from Nasopharyngeal swab specimens and  should not be used as a sole basis for treatment. Nasal washings and  aspirates are unacceptable for Xpert Xpress SARS-CoV-2/FLU/RSV  testing.  Fact Sheet for Patients: PinkCheek.be  Fact Sheet for Healthcare Providers: GravelBags.it  This test is not yet approved or cleared by the [place] FDA  and  has been authorized for detection and/or diagnosis of SARS-CoV-2 by  FDA under an Emergency Use Authorization (EUA). This EUA will remain  in effect (meaning this test can be used) for the duration of the  Covid-19 declaration under Section 564(b)(1) of the Act, 21  U.S.C. section 360bbb-3(b)(1), unless the authorization is  terminated or revoked. Performed at [facility], [place] [address] [place], [place] [address]          Radiology Studies: DG Chest 2 View  Result Date: 03/19/2020 CLINICAL DATA:  Shortness of breath. EXAM: CHEST - 2 VIEW COMPARISON:  February 25, 2020. FINDINGS: The heart size and mediastinal contours are within normal limits. No pneumothorax or pleural effusion is noted. Mild bibasilar subsegmental atelectasis or infiltrates are noted. Small sliding-type hiatal hernia is noted. Status post kyphoplasty at 2 levels within the thoracic spine. IMPRESSION: Mild bibasilar subsegmental atelectasis or infiltrates are noted. Electronically Signed   By: Marijo Conception M.D.   On:  03/19/2020 09:14   CT Head W or Wo Contrast  Result Date: 03/19/2020 CLINICAL DATA:  History of skull base metastatic disease. Generalized weakness over the last 2 weeks. Metastatic breast cancer. EXAM: CT HEAD WITHOUT AND WITH CONTRAST TECHNIQUE: Contiguous axial images were obtained from the base of the skull through the vertex without and with intravenous contrast CONTRAST:  14mL OMNIPAQUE IOHEXOL 350 MG/ML SOLN COMPARISON:  Cervical MRI 10/24/2019. Bone scan 02/17/2020. Head CT 12/02/2019 FINDINGS: Brain: The brain has normal appearance without evidence of atrophy, old or recent infarction, mass lesion, hemorrhage, hydrocephalus or extra-axial collection. No abnormal contrast enhancement occurs. Vascular: No abnormal vascular finding. Skull: 13 x 9 x 9 mm sclerotic focus within the right side of the clivus without evidence lytic bone destruction or extraosseous tumor. This could be related to bony metastatic disease or could be a focus of fibrous dysplasia. No second regional lesion is identified. Sinuses/Orbits: Clear sinuses as seen. Limited orbital visualization is negative. Other: None IMPRESSION: 1. 13 x 9 x 9 mm sclerotic focus within the right side of the clivus without evidence of lytic bone destruction or extraosseous tumor. This could be related to bony metastatic disease or could be a focus of fibrous dysplasia. No second regional lesion is identified. 2. Normal appearance of the brain itself. Electronically Signed   By: Nelson Chimes M.D.   On: 03/19/2020 11:40   CT Angio Chest PE W and/or Wo Contrast  Result Date: 03/19/2020 CLINICAL DATA:  High probability for pulmonary embolism. Shortness of breath EXAM: CT ANGIOGRAPHY CHEST WITH CONTRAST TECHNIQUE: Multidetector CT imaging of the chest was performed using the standard protocol during bolus administration of intravenous contrast. Multiplanar CT image reconstructions and MIPs were obtained to evaluate the vascular anatomy. CONTRAST:   121mL OMNIPAQUE IOHEXOL 350 MG/ML SOLN COMPARISON:  06/04/2014 FINDINGS: Cardiovascular: Normal heart size. No pericardial effusion. No pulmonary artery filling defects. Normal aorta Mediastinum/Nodes: Large sliding hiatal hernia. Lungs/Pleura: Dependent streaky opacity consistent with atelectasis. There is no edema, consolidation, effusion, or pneumothorax. Upper Abdomen: No acute finding Musculoskeletal: No acute finding. T6 and T10 compression fracture with advanced height loss at T10. Both of these levels have undergone cement augmentation. Multifocal vertebral metastatic disease with similar extent when compared to a thoracic MRI October 24, 2019. No evidence of acute fracture. Other: Intra and extracapsular breast implant rupture on the right, chronic when compared to 2016. Review of the MIP images confirms the above findings. IMPRESSION: 1. Negative for pulmonary embolism.  2. Low volume chest with atelectasis at the bases. 3. Known osseous metastatic disease.  No acute fracture. 4. Large sliding hiatal hernia. Electronically Signed   By: Monte Fantasia M.D.   On: 03/19/2020 11:45   MR BRAIN W WO CONTRAST  Result Date: 03/20/2020 CLINICAL DATA:  Metastatic breast cancer. Evaluate clivus involvement. EXAM: MRI HEAD WITHOUT AND WITH CONTRAST TECHNIQUE: Multiplanar, multiecho pulse sequences of the brain and surrounding structures were obtained without and with intravenous contrast. CONTRAST:  2mL GADAVIST GADOBUTROL 1 MMOL/ML IV SOLN COMPARISON:  Head CT yesterday. Cervical MRI 10/24/2019. Bone scan 02/17/2020. Head CT 12/02/2019. FINDINGS: Brain: Diffusion imaging does not show any acute or subacute infarction. The brainstem and cerebellum are normal. Cerebral hemispheres are normal. No evidence of metastatic disease to the brain or leptomeninges. There is some artifact related to previous temporomandibular joint region surgery on the left. No hemorrhage, hydrocephalus or extra-axial collection. No abnormal  contrast enhancement. Vascular: Major vessels at the base of the brain show flow. Skull and upper cervical spine: As shown previously, there is a 13 mm region of diminished T1 and T2 signal in the right side of the clivus. This could be a metastasis or fibrous dysplasia. No lytic destructive component. No extraosseous component. No second bone lesion identified. Sinuses/Orbits: Clear/normal Other: None IMPRESSION: 1. Normal appearance of the brain itself. No evidence of metastatic disease to the brain or leptomeninges. 2. 13 mm region of diminished T1 and T2 signal in the right side of the clivus. This could be a metastasis or fibrous dysplasia. No extraosseous component. No second bone lesion identified. Electronically Signed   By: Nelson Chimes M.D.   On: 03/20/2020 11:12   DG ESOPHAGUS W SINGLE CM (SOL OR THIN BA)  Result Date: 03/20/2020 CLINICAL DATA:  54 year old female with history of globus sensation while ingesting solid food for the past 2-3 weeks. History of gastroesophageal reflux disease. EXAM: ESOPHOGRAM / BARIUM SWALLOW / BARIUM TABLET STUDY TECHNIQUE: Combined double contrast and single contrast examination performed using effervescent crystals, thick barium liquid, and thin barium liquid. The patient was observed with fluoroscopy swallowing a 13 mm barium sulphate tablet. FLUOROSCOPY TIME:  Fluoroscopy Time:  1 minutes and 36 seconds Radiation Exposure Index (if provided by the fluoroscopic device): 12.5 mGy COMPARISON:  None. FINDINGS: Double contrast images of the esophagus demonstrate a normal appearance of the esophageal mucosa. Multiple single swallow attempts were observed which demonstrated normal esophageal motility. Full column esophagram demonstrated no esophageal mass, stricture or esophageal ring. Large hiatal hernia. A barium tablet was administered, which passed readily into the stomach. IMPRESSION: 1. Large hiatal hernia. 2. Normal esophageal motility. Electronically Signed   By:  [name] M.D.   On: 03/20/2020 15:46        Scheduled Meds: . dronabinol  5 mg Oral BID AC  . enoxaparin (LOVENOX) injection  40 mg Subcutaneous Q24H  . LORazepam  1 mg Intravenous On Call  . methadone  5 mg Oral QHS  . oxyCODONE  10 mg Oral Daily   And  . oxyCODONE  20 mg Oral QPM  . pantoprazole  40 mg Oral QPM  . senna  1 tablet Oral BID  . sodium chloride flush  3 mL Intravenous Q12H   Continuous Infusions:   LOS: 0 days   Time spent: 48mins Greater than 50% of this time was spent in counseling, explanation of diagnosis, planning of further management, and coordination of care.  I have personally reviewed and interpreted  on  03/20/2020 daily labs, tele strips, I reviewed all nursing notes, pharmacy notes, consultant notes,  vitals, pertinent old records  I have discussed plan of care as described above with RN , patient  on 03/20/2020  Voice Recognition /Dragon dictation system was used to create this note, attempts have been made to correct errors. Please contact the author with questions and/or clarifications.   Florencia Reasons, MD PhD FACP [facility]  Available via Epic secure chat 7am-7pm for nonurgent issues Please page for urgent issues To page the attending provider between 7A-7P or the covering provider during after hours 7P-7A, please log into the web site [email or web address] and access using universal [place] password for that web site. If you do not have the password, please call the hospital operator.    03/20/2020, 6:10 PM

## 2020-03-21 ENCOUNTER — Encounter (HOSPITAL_COMMUNITY)
Admission: EM | Disposition: A | Discharge status: Home / Self Care | Payer: Self-pay | Source: Home / Self Care | Attending: Internal Medicine

## 2020-03-21 ENCOUNTER — Encounter (HOSPITAL_COMMUNITY): Payer: Self-pay | Admitting: Internal Medicine

## 2020-03-21 ENCOUNTER — Observation Stay (HOSPITAL_COMMUNITY): Payer: BC Managed Care – PPO | Admitting: Anesthesiology

## 2020-03-21 DIAGNOSIS — Z66 Do not resuscitate: Secondary | ICD-10-CM | POA: Diagnosis present

## 2020-03-21 DIAGNOSIS — E876 Hypokalemia: Secondary | ICD-10-CM

## 2020-03-21 DIAGNOSIS — Z853 Personal history of malignant neoplasm of breast: Secondary | ICD-10-CM | POA: Diagnosis not present

## 2020-03-21 DIAGNOSIS — K219 Gastro-esophageal reflux disease without esophagitis: Secondary | ICD-10-CM | POA: Diagnosis present

## 2020-03-21 DIAGNOSIS — R1113 Vomiting of fecal matter: Secondary | ICD-10-CM | POA: Diagnosis not present

## 2020-03-21 DIAGNOSIS — K5900 Constipation, unspecified: Secondary | ICD-10-CM

## 2020-03-21 DIAGNOSIS — G893 Neoplasm related pain (acute) (chronic): Secondary | ICD-10-CM | POA: Diagnosis present

## 2020-03-21 DIAGNOSIS — Z9011 Acquired absence of right breast and nipple: Secondary | ICD-10-CM | POA: Diagnosis not present

## 2020-03-21 DIAGNOSIS — Z20822 Contact with and (suspected) exposure to covid-19: Secondary | ICD-10-CM | POA: Diagnosis present

## 2020-03-21 DIAGNOSIS — E785 Hyperlipidemia, unspecified: Secondary | ICD-10-CM | POA: Diagnosis present

## 2020-03-21 DIAGNOSIS — R531 Weakness: Secondary | ICD-10-CM | POA: Diagnosis present

## 2020-03-21 DIAGNOSIS — D638 Anemia in other chronic diseases classified elsewhere: Secondary | ICD-10-CM | POA: Diagnosis present

## 2020-03-21 DIAGNOSIS — R1310 Dysphagia, unspecified: Secondary | ICD-10-CM | POA: Diagnosis present

## 2020-03-21 DIAGNOSIS — K5909 Other constipation: Secondary | ICD-10-CM | POA: Diagnosis present

## 2020-03-21 DIAGNOSIS — R112 Nausea with vomiting, unspecified: Secondary | ICD-10-CM | POA: Diagnosis present

## 2020-03-21 DIAGNOSIS — C7951 Secondary malignant neoplasm of bone: Secondary | ICD-10-CM | POA: Diagnosis present

## 2020-03-21 DIAGNOSIS — F419 Anxiety disorder, unspecified: Secondary | ICD-10-CM | POA: Diagnosis present

## 2020-03-21 DIAGNOSIS — D701 Agranulocytosis secondary to cancer chemotherapy: Secondary | ICD-10-CM | POA: Diagnosis present

## 2020-03-21 DIAGNOSIS — Z8371 Family history of colonic polyps: Secondary | ICD-10-CM | POA: Diagnosis not present

## 2020-03-21 DIAGNOSIS — Z8249 Family history of ischemic heart disease and other diseases of the circulatory system: Secondary | ICD-10-CM | POA: Diagnosis not present

## 2020-03-21 DIAGNOSIS — F32A Depression, unspecified: Secondary | ICD-10-CM | POA: Diagnosis present

## 2020-03-21 DIAGNOSIS — K449 Diaphragmatic hernia without obstruction or gangrene: Secondary | ICD-10-CM | POA: Diagnosis present

## 2020-03-21 DIAGNOSIS — C50919 Malignant neoplasm of unspecified site of unspecified female breast: Secondary | ICD-10-CM | POA: Diagnosis not present

## 2020-03-21 DIAGNOSIS — D539 Nutritional anemia, unspecified: Secondary | ICD-10-CM | POA: Diagnosis present

## 2020-03-21 DIAGNOSIS — E860 Dehydration: Secondary | ICD-10-CM | POA: Diagnosis present

## 2020-03-21 DIAGNOSIS — R9431 Abnormal electrocardiogram [ECG] [EKG]: Secondary | ICD-10-CM | POA: Diagnosis present

## 2020-03-21 DIAGNOSIS — T451X5A Adverse effect of antineoplastic and immunosuppressive drugs, initial encounter: Secondary | ICD-10-CM | POA: Diagnosis present

## 2020-03-21 DIAGNOSIS — C7800 Secondary malignant neoplasm of unspecified lung: Secondary | ICD-10-CM | POA: Diagnosis present

## 2020-03-21 DIAGNOSIS — R0981 Nasal congestion: Secondary | ICD-10-CM | POA: Diagnosis present

## 2020-03-21 HISTORY — PX: ESOPHAGOGASTRODUODENOSCOPY (EGD) WITH PROPOFOL: SHX5813

## 2020-03-21 HISTORY — PX: BIOPSY: SHX5522

## 2020-03-21 LAB — BASIC METABOLIC PANEL
Anion gap: 4 — ABNORMAL LOW (ref 5–15)
BUN: 6 mg/dL (ref 6–20)
CO2: 27 mmol/L (ref 22–32)
Calcium: 8.1 mg/dL — ABNORMAL LOW (ref 8.9–10.3)
Chloride: 105 mmol/L (ref 98–111)
Creatinine, Ser: 0.73 mg/dL (ref 0.44–1.00)
GFR, Estimated: >60 mL/min (ref >60)
Glucose, Bld: 88 mg/dL (ref 70–99)
Potassium: 3.3 mmol/L — ABNORMAL LOW (ref 3.5–5.1)
Sodium: 136 mmol/L (ref 135–145)

## 2020-03-21 LAB — RETICULOCYTES
Immature Retic Fract: 20.5 % — ABNORMAL HIGH (ref 2.3–15.9)
RBC.: 3.04 MIL/uL — ABNORMAL LOW (ref 3.87–5.11)
Retic Count, Absolute: 31.9 10*3/uL (ref 19.0–186.0)
Retic Ct Pct: 1.1 % (ref 0.4–3.1)

## 2020-03-21 LAB — CBC WITH DIFFERENTIAL/PLATELET
Abs Immature Granulocytes: 0.02 10*3/uL (ref 0.00–0.07)
Basophils Absolute: 0 10*3/uL (ref 0.0–0.1)
Basophils Relative: 1 %
Eosinophils Absolute: 0 10*3/uL (ref 0.0–0.5)
Eosinophils Relative: 2 %
HCT: 31.6 % — ABNORMAL LOW (ref 36.0–46.0)
Hemoglobin: 10.7 g/dL — ABNORMAL LOW (ref 12.0–15.0)
Immature Granulocytes: 1 %
Lymphocytes Relative: 50 %
Lymphs Abs: 1.1 10*3/uL (ref 0.7–4.0)
MCH: 35.2 pg — ABNORMAL HIGH (ref 26.0–34.0)
MCHC: 33.9 g/dL (ref 30.0–36.0)
MCV: 103.9 fL — ABNORMAL HIGH (ref 80.0–100.0)
Monocytes Absolute: 0.2 10*3/uL (ref 0.1–1.0)
Monocytes Relative: 7 %
Neutro Abs: 0.9 10*3/uL — ABNORMAL LOW (ref 1.7–7.7)
Neutrophils Relative %: 39 %
Platelets: 141 10*3/uL — ABNORMAL LOW (ref 150–400)
RBC: 3.04 MIL/uL — ABNORMAL LOW (ref 3.87–5.11)
RDW: 20.1 % — ABNORMAL HIGH (ref 11.5–15.5)
WBC: 2.3 10*3/uL — ABNORMAL LOW (ref 4.0–10.5)
nRBC: 0 % (ref 0.0–0.2)

## 2020-03-21 LAB — IRON AND TIBC
Iron: 101 ug/dL (ref 28–170)
Iron: 99 ug/dL (ref 28–170)
Saturation Ratios: 41 % — ABNORMAL HIGH (ref 10.4–31.8)
Saturation Ratios: 43 % — ABNORMAL HIGH (ref 10.4–31.8)
TIBC: 236 ug/dL — ABNORMAL LOW (ref 250–450)
TIBC: 242 ug/dL — ABNORMAL LOW (ref 250–450)
UIBC: 135 ug/dL
UIBC: 143 ug/dL

## 2020-03-21 LAB — MAGNESIUM: Magnesium: 2 mg/dL (ref 1.7–2.4)

## 2020-03-21 LAB — CANCER ANTIGEN 27.29: CA 27.29: 59.7 U/mL — ABNORMAL HIGH (ref 0.0–38.6)

## 2020-03-21 LAB — VITAMIN B12: Vitamin B-12: 134 pg/mL — ABNORMAL LOW (ref 180–914)

## 2020-03-21 LAB — FERRITIN
Ferritin: 45 ng/mL (ref 11–307)
Ferritin: 48 ng/mL (ref 11–307)

## 2020-03-21 LAB — TSH: TSH: 3.461 u[IU]/mL (ref 0.350–4.500)

## 2020-03-21 LAB — CORTISOL: Cortisol, Plasma: 19 ug/dL

## 2020-03-21 SURGERY — ESOPHAGOGASTRODUODENOSCOPY (EGD) WITH PROPOFOL
Anesthesia: Monitor Anesthesia Care

## 2020-03-21 MED ORDER — LACTATED RINGERS IV SOLN: INTRAVENOUS | Status: DC | PRN

## 2020-03-21 MED ORDER — PROPOFOL 1000 MG/100ML IV EMUL
INTRAVENOUS | Status: AC
  Filled 2020-03-21: qty 100

## 2020-03-21 MED ORDER — PROPOFOL 500 MG/50ML IV EMUL
INTRAVENOUS | Status: DC | PRN
  Administered 2020-03-21: 140 ug/kg/min via INTRAVENOUS

## 2020-03-21 MED ORDER — PROPOFOL 10 MG/ML IV BOLUS
INTRAVENOUS | Status: DC | PRN
  Administered 2020-03-21: 20 mg via INTRAVENOUS

## 2020-03-21 MED ORDER — POTASSIUM CHLORIDE 10 MEQ/100ML IV SOLN
10.0000 meq | INTRAVENOUS | Status: AC
  Administered 2020-03-21 (×4): 10 meq via INTRAVENOUS
  Filled 2020-03-21: qty 100

## 2020-03-21 MED ORDER — GUAIFENESIN ER 600 MG PO TB12
600.0000 mg | ORAL_TABLET | Freq: Two times a day (BID) | ORAL | Status: DC
  Administered 2020-03-21 – 2020-03-24 (×6): 600 mg via ORAL
  Filled 2020-03-21 (×6): qty 1

## 2020-03-21 MED ORDER — ENOXAPARIN SODIUM 40 MG/0.4ML ~~LOC~~ SOLN
40.0000 mg | SUBCUTANEOUS | Status: DC
  Administered 2020-03-22: 40 mg via SUBCUTANEOUS
  Filled 2020-03-21: qty 0.4

## 2020-03-21 MED ORDER — VITAMIN B-12 1000 MCG PO TABS
1000.0000 ug | ORAL_TABLET | Freq: Every day | ORAL | Status: DC
  Administered 2020-03-22 – 2020-03-23 (×2): 1000 ug via ORAL
  Filled 2020-03-21 (×2): qty 1

## 2020-03-21 MED ORDER — POLYETHYLENE GLYCOL 3350 17 G PO PACK
17.0000 g | PACK | Freq: Two times a day (BID) | ORAL | Status: DC
  Administered 2020-03-21 – 2020-03-24 (×6): 17 g via ORAL
  Filled 2020-03-21 (×6): qty 1

## 2020-03-21 MED ORDER — LINACLOTIDE 145 MCG PO CAPS
145.0000 ug | ORAL_CAPSULE | Freq: Every day | ORAL | Status: DC
  Administered 2020-03-21 – 2020-03-24 (×4): 145 ug via ORAL
  Filled 2020-03-21 (×6): qty 1

## 2020-03-21 MED ORDER — FLUTICASONE PROPIONATE 50 MCG/ACT NA SUSP
2.0000 | Freq: Every day | NASAL | Status: DC
  Administered 2020-03-21 – 2020-03-24 (×4): 2 via NASAL
  Filled 2020-03-21: qty 16

## 2020-03-21 SURGICAL SUPPLY — 15 items

## 2020-03-21 NOTE — Progress Notes (Signed)
PROGRESS NOTE    Caitlin Greene  FUX:[id number] DOB: 05/03/1966 DOA: 03/19/2020 PCP: Caitlin Borg, MD    Chief Complaint  Patient presents with  . Weakness  . 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 s/p right mastectomy recently found to have metastases to bone and possibly lung on anastrozole and Ibrance follows at [facility], GERD, chronic vertigo, anxiety and depression, hyperlipidemia who presented to the ED with concern for generalized weakness, nausea, vomiting and shortness of breath for the past couple weeks  Subjective:  She is seen after return from EGD , able to tolerate small amount of liquid, still have some nausea , no vomiting  Report sinus issues with mild sinus headache and associated dizziness Sister at bedside    Assessment & Plan:   Principal Problem:   Nausea & vomiting Active Problems:   Generalized weakness   Metastatic breast cancer ([place])   Cancer related pain   Prolonged QT interval   Dysphagia   Intractable nausea and vomiting ( started to have intermittent n/v since June this year, but became constant in the last 10 days), dehydration, generalized weakness, 55lbs weight loss since June -Anastrozole and Ibrance stopped due to possible side effect -also has intermittent vertigo -No oropharyngeal dysphagia per speech eval , EGD esophagus showed hiatal hernia ,GI consulted for EGD -Appear dehydrated, with poor oral intake, continue  d5LR infusion, continue as needed antiemetics ,advance diet as tolerated -nutrition consulted   Hypokalemia: Remain low, continue to replace k, mag 2.0  Neutropenia -Possible cancer treatment related, anastrozole and Ibrance on hold  Macrocytic anemia -Appear new,  -tbilin wnl -low B12 level, TSH unremarkable, iron study and retic count consistent with anemia of chronic disease -Does not appear to have overt signs of blood loss, check  F OBT -Start B12 supplement  Prolonged QTC QTC 491 Avoid QT prolongation agent, keep potassium above 4, magnesium above 2 Repeat EKG in the morning  Metastatic breast cancer/cancer related pain -Management per oncology   Sinus congestion; start Flonase and Mucinex.  Consider antibiotics if symptoms does not improve, as needed Antivert  Body mass index is 24.6 kg/m.      DVT prophylaxis: enoxaparin (LOVENOX) injection 40 mg Start: 03/19/20 1600   Code Status:DNR Family Communication: Sister at bedside with permission Disposition:   Status is: Inpatient  Dispo: The patient is from: home              Anticipated d/c is to: home              Anticipated d/c date is: TBD, pending symptom involvement,                Consultants:   Oncology  GI  Procedures:   EGD  Antimicrobials:    none    Objective: Vitals:   03/21/20 0017 03/21/20 0524 03/21/20 0834 03/21/20 0835  BP: 112/84 106/76 105/76 105/76  Pulse: 80 71 69 69  Resp: 14 14 11 11   Temp: 98.8 F (37.1 C) 98 F (36.7 C) 98 F (36.7 C) 98 F (36.7 C)  TempSrc: Oral Oral Oral Oral  SpO2: 96% 95% 93% 93%  Weight:  73.4 kg    Height:        Intake/Output Summary (Last 24 hours) at 03/21/2020 0851 Last data filed at 03/21/2020 0801 Gross per 24 hour  Intake 2871.01 ml  Output 100 ml  Net  2771.01 ml   Filed Weights   03/19/20 0816 03/21/20 0524  Weight: 93.4 kg 73.4 kg    Examination:  General exam: calm, NAD Respiratory system: Clear to auscultation. Respiratory effort normal. Cardiovascular system: S1 & S2 heard, RRR. No pedal edema. Gastrointestinal system: Abdomen is nondistended, soft and nontender. Normal bowel sounds heard. Central nervous system: Alert and oriented. No focal neurological deficits. Extremities: Symmetric 5 x 5 power. Skin: No rashes, lesions or ulcers Psychiatry: Judgement and insight appear normal. Mood & affect appropriate.     Data Reviewed: I have  personally reviewed following labs and imaging studies  CBC: Recent Labs  Lab 03/19/20 0819 03/20/20 0240 03/21/20 0748  WBC 2.4* 2.6* 2.3*  NEUTROABS 1.2*  --  PENDING  HGB 13.3 10.4* 10.7*  HCT 37.2 30.5* 31.6*  MCV 99.2 104.1* 103.9*  PLT 206 143* 141*    Basic Metabolic Panel: Recent Labs  Lab 03/19/20 0910 03/20/20 0237 03/20/20 0240 03/21/20 0748  NA 136 139  --  136  K 3.4* 3.7  --  3.3*  CL 105 110  --  105  CO2 19* 23  --  27  GLUCOSE 103* 71  --  88  BUN 14 7  --  6  CREATININE 0.79 0.63 0.58 0.73  CALCIUM 8.9 7.5*  --  8.1*  MG  --   --   --  2.0    GFR: Estimated Creatinine Clearance: 79.2 mL/min (by C-G formula based on SCr of 0.73 mg/dL).  Liver Function Tests: Recent Labs  Lab 03/19/20 0910 03/20/20 0237  AST 24 14*  ALT 23 18  ALKPHOS 46 34*  BILITOT 1.2 0.8  PROT 8.0 5.9*  ALBUMIN 4.4 3.2*    CBG: No results for input(s): GLUCAP in the last 168 hours.   Recent Results (from the past 240 hour(s))  Respiratory Panel by RT PCR (Flu A&B, Covid) - Nasopharyngeal Swab     Status: None   Collection Time: 03/19/20  9:10 AM   Specimen: Nasopharyngeal Swab  Result Value Ref Range Status   SARS Coronavirus 2 by RT PCR NEGATIVE NEGATIVE Final    Comment: (NOTE) SARS-CoV-2 target nucleic acids are NOT DETECTED.  The SARS-CoV-2 RNA is generally detectable in upper respiratoy specimens during the acute phase of infection. The lowest concentration of SARS-CoV-2 viral copies this assay can detect is 131 copies/mL. A negative result does not preclude SARS-Cov-2 infection and should not be used as the sole basis for treatment or other patient management decisions. A negative result may occur with  improper specimen collection/handling, submission of specimen other than nasopharyngeal swab, presence of viral mutation(s) within the areas targeted by this assay, and inadequate number of viral copies (<131 copies/mL). A negative result must be  combined with clinical observations, patient history, and epidemiological information. The expected result is Negative.  Fact Sheet for Patients:  PinkCheek.be  Fact Sheet for Healthcare Providers:  GravelBags.it  This test is no t yet approved or cleared by the [place] FDA and  has been authorized for detection and/or diagnosis of SARS-CoV-2 by FDA under an Emergency Use Authorization (EUA). This EUA will remain  in effect (meaning this test can be used) for the duration of the COVID-19 declaration under Section 564(b)(1) of the Act, 21 U.S.C. section 360bbb-3(b)(1), unless the authorization is terminated or revoked sooner.     Influenza A by PCR NEGATIVE NEGATIVE Final   Influenza B by PCR NEGATIVE NEGATIVE Final    Comment: (  NOTE) The Xpert Xpress SARS-CoV-2/FLU/RSV assay is intended as an aid in  the diagnosis of influenza from Nasopharyngeal swab specimens and  should not be used as a sole basis for treatment. Nasal washings and  aspirates are unacceptable for Xpert Xpress SARS-CoV-2/FLU/RSV  testing.  Fact Sheet for Patients: PinkCheek.be  Fact Sheet for Healthcare Providers: GravelBags.it  This test is not yet approved or cleared by the [place] FDA and  has been authorized for detection and/or diagnosis of SARS-CoV-2 by  FDA under an Emergency Use Authorization (EUA). This EUA will remain  in effect (meaning this test can be used) for the duration of the  Covid-19 declaration under Section 564(b)(1) of the Act, 21  U.S.C. section 360bbb-3(b)(1), unless the authorization is  terminated or revoked. Performed at [facility], [place] [address] [place], [place] [address]          Radiology Studies: DG Chest 2 View  Result Date: 03/19/2020 CLINICAL DATA:  Shortness of breath. EXAM: CHEST - 2 VIEW COMPARISON:  February 25, 2020. FINDINGS: The heart size and mediastinal contours are within normal limits. No pneumothorax or pleural effusion is noted. Mild bibasilar subsegmental atelectasis or infiltrates are noted. Small sliding-type hiatal hernia is noted. Status post kyphoplasty at 2 levels within the thoracic spine. IMPRESSION: Mild bibasilar subsegmental atelectasis or infiltrates are noted. Electronically Signed   By: Marijo Conception M.D.   On: 03/19/2020 09:14   CT Head W or Wo Contrast  Result Date: 03/19/2020 CLINICAL DATA:  History of skull base metastatic disease. Generalized weakness over the last 2 weeks. Metastatic breast cancer. EXAM: CT HEAD WITHOUT AND WITH CONTRAST TECHNIQUE: Contiguous axial images were obtained from the base of the skull through the vertex without and with intravenous contrast CONTRAST:  159mL OMNIPAQUE IOHEXOL 350 MG/ML SOLN COMPARISON:  Cervical MRI 10/24/2019. Bone scan 02/17/2020. Head CT 12/02/2019 FINDINGS: Brain: The brain has normal appearance without evidence of atrophy, old or recent infarction, mass lesion, hemorrhage, hydrocephalus or extra-axial collection. No abnormal contrast enhancement occurs. Vascular: No abnormal vascular finding. Skull: 13 x 9 x 9 mm sclerotic focus within the right side of the clivus without evidence lytic bone destruction or extraosseous tumor. This could be related to bony metastatic disease or could be a focus of fibrous dysplasia. No second regional lesion is identified. Sinuses/Orbits: Clear sinuses as seen. Limited orbital visualization is negative. Other: None IMPRESSION: 1. 13 x 9 x 9 mm sclerotic focus within the right side of the clivus without evidence of lytic bone destruction or extraosseous tumor. This could be related to bony metastatic disease or could be a focus of fibrous dysplasia. No second regional lesion is identified. 2. Normal appearance of the brain itself. Electronically Signed   By: Nelson Chimes M.D.   On: 03/19/2020 11:40   CT  Angio Chest PE W and/or Wo Contrast  Result Date: 03/19/2020 CLINICAL DATA:  High probability for pulmonary embolism. Shortness of breath EXAM: CT ANGIOGRAPHY CHEST WITH CONTRAST TECHNIQUE: Multidetector CT imaging of the chest was performed using the standard protocol during bolus administration of intravenous contrast. Multiplanar CT image reconstructions and MIPs were obtained to evaluate the vascular anatomy. CONTRAST:  124mL OMNIPAQUE IOHEXOL 350 MG/ML SOLN COMPARISON:  06/04/2014 FINDINGS: Cardiovascular: Normal heart size. No pericardial effusion. No pulmonary artery filling defects. Normal aorta Mediastinum/Nodes: Large sliding hiatal hernia. Lungs/Pleura: Dependent streaky opacity consistent with atelectasis. There is no edema, consolidation, effusion, or pneumothorax. Upper Abdomen: No acute finding Musculoskeletal:  No acute finding. T6 and T10 compression fracture with advanced height loss at T10. Both of these levels have undergone cement augmentation. Multifocal vertebral metastatic disease with similar extent when compared to a thoracic MRI October 24, 2019. No evidence of acute fracture. Other: Intra and extracapsular breast implant rupture on the right, chronic when compared to 2016. Review of the MIP images confirms the above findings. IMPRESSION: 1. Negative for pulmonary embolism. 2. Low volume chest with atelectasis at the bases. 3. Known osseous metastatic disease.  No acute fracture. 4. Large sliding hiatal hernia. Electronically Signed   By: Monte Fantasia M.D.   On: 03/19/2020 11:45   MR BRAIN W WO CONTRAST  Result Date: 03/20/2020 CLINICAL DATA:  Metastatic breast cancer. Evaluate clivus involvement. EXAM: MRI HEAD WITHOUT AND WITH CONTRAST TECHNIQUE: Multiplanar, multiecho pulse sequences of the brain and surrounding structures were obtained without and with intravenous contrast. CONTRAST:  80mL GADAVIST GADOBUTROL 1 MMOL/ML IV SOLN COMPARISON:  Head CT yesterday. Cervical MRI  10/24/2019. Bone scan 02/17/2020. Head CT 12/02/2019. FINDINGS: Brain: Diffusion imaging does not show any acute or subacute infarction. The brainstem and cerebellum are normal. Cerebral hemispheres are normal. No evidence of metastatic disease to the brain or leptomeninges. There is some artifact related to previous temporomandibular joint region surgery on the left. No hemorrhage, hydrocephalus or extra-axial collection. No abnormal contrast enhancement. Vascular: Major vessels at the base of the brain show flow. Skull and upper cervical spine: As shown previously, there is a 13 mm region of diminished T1 and T2 signal in the right side of the clivus. This could be a metastasis or fibrous dysplasia. No lytic destructive component. No extraosseous component. No second bone lesion identified. Sinuses/Orbits: Clear/normal Other: None IMPRESSION: 1. Normal appearance of the brain itself. No evidence of metastatic disease to the brain or leptomeninges. 2. 13 mm region of diminished T1 and T2 signal in the right side of the clivus. This could be a metastasis or fibrous dysplasia. No extraosseous component. No second bone lesion identified. Electronically Signed   By: Nelson Chimes M.D.   On: 03/20/2020 11:12   DG ESOPHAGUS W SINGLE CM (SOL OR THIN BA)  Result Date: 03/20/2020 CLINICAL DATA:  54 year old female with history of globus sensation while ingesting solid food for the past 2-3 weeks. History of gastroesophageal reflux disease. EXAM: ESOPHOGRAM / BARIUM SWALLOW / BARIUM TABLET STUDY TECHNIQUE: Combined double contrast and single contrast examination performed using effervescent crystals, thick barium liquid, and thin barium liquid. The patient was observed with fluoroscopy swallowing a 13 mm barium sulphate tablet. FLUOROSCOPY TIME:  Fluoroscopy Time:  1 minutes and 36 seconds Radiation Exposure Index (if provided by the fluoroscopic device): 12.5 mGy COMPARISON:  None. FINDINGS: Double contrast images of  the esophagus demonstrate a normal appearance of the esophageal mucosa. Multiple single swallow attempts were observed which demonstrated normal esophageal motility. Full column esophagram demonstrated no esophageal mass, stricture or esophageal ring. Large hiatal hernia. A barium tablet was administered, which passed readily into the stomach. IMPRESSION: 1. Large hiatal hernia. 2. Normal esophageal motility. Electronically Signed   By: [name] M.D.   On: 03/20/2020 15:46        Scheduled Meds: . dronabinol  5 mg Oral BID AC  . enoxaparin (LOVENOX) injection  40 mg Subcutaneous Q24H  . LORazepam  1 mg Intravenous On Call  . methadone  5 mg Oral QHS  . oxyCODONE  10 mg Oral Daily   And  . oxyCODONE  20 mg Oral QPM  . pantoprazole  40 mg Oral QPM  . polyethylene glycol  17 g Oral BID  . senna-docusate  1 tablet Oral BID  . sodium chloride flush  3 mL Intravenous Q12H   Continuous Infusions: . dextrose 5% lactated ringers 75 mL/hr at 03/21/20 0616  . potassium chloride       LOS: 0 days   Time spent: 18mins, case discussed with GI Greater than 50% of this time was spent in counseling, explanation of diagnosis, planning of further management, and coordination of care.  I have personally reviewed and interpreted on  03/21/2020 daily labs, tele strips, I reviewed all nursing notes, pharmacy notes, consultant notes,  vitals, pertinent old records  I have discussed plan of care as described above with RN , patient and family on 03/21/2020  Voice Recognition /Dragon dictation system was used to create this note, attempts have been made to correct errors. Please contact the author with questions and/or clarifications.   Florencia Reasons, MD PhD FACP [facility]  Available via Epic secure chat 7am-7pm for nonurgent issues Please page for urgent issues To page the attending provider between 7A-7P or the covering provider during after hours 7P-7A, please log into the web site  [email or web address] and access using universal [place] password for that web site. If you do not have the password, please call the hospital operator.    03/21/2020, 8:51 AM

## 2020-03-21 NOTE — Transfer of Care (Signed)
Immediate Anesthesia Transfer of Care Note  Patient: Caitlin Greene  Procedure(s) Performed: ESOPHAGOGASTRODUODENOSCOPY (EGD) WITH PROPOFOL (N/A ) BIOPSY  Patient Location: PACU and Endoscopy Unit  Anesthesia Type:MAC  Level of Consciousness: awake, alert , oriented and patient cooperative  Airway & Oxygen Therapy: Patient Spontanous Breathing and Patient connected to face mask oxygen  Post-op Assessment: Report given to RN, Post -op Vital signs reviewed and stable and Patient moving all extremities  Post vital signs: Reviewed and stable  Last Vitals:  Vitals Value Taken Time  BP 123/85 03/21/20 1217  Temp    Pulse 87 03/21/20 1218  Resp 24 03/21/20 1218  SpO2 100 % 03/21/20 1218  Vitals shown include unvalidated device data.  Last Pain:  Vitals:   03/21/20 1143  TempSrc: Oral  PainSc: 0-No pain         Complications: No complications documented.

## 2020-03-21 NOTE — Anesthesia Postprocedure Evaluation (Signed)
Anesthesia Post Note  Patient: Caitlin Greene  Procedure(s) Performed: ESOPHAGOGASTRODUODENOSCOPY (EGD) WITH PROPOFOL (N/A ) BIOPSY     Patient location during evaluation: PACU Anesthesia Type: MAC Level of consciousness: awake and alert Pain management: pain level controlled Vital Signs Assessment: post-procedure vital signs reviewed and stable Respiratory status: spontaneous breathing, nonlabored ventilation, respiratory function stable and patient connected to nasal cannula oxygen Cardiovascular status: stable and blood pressure returned to baseline Postop Assessment: no apparent nausea or vomiting Anesthetic complications: no   No complications documented.  Last Vitals:  Vitals:   03/21/20 1220 03/21/20 1230  BP: 112/65 125/70  Pulse: 79 78  Resp: (!) 21 18  Temp:    SpO2: 100% 95%    Last Pain:  Vitals:   03/21/20 1230  TempSrc:   PainSc: 0-No pain                 Effie Berkshire

## 2020-03-21 NOTE — Anesthesia Preprocedure Evaluation (Addendum)
Anesthesia Evaluation  Patient identified by MRN, date of birth, ID band Patient awake    Reviewed: Allergy & Precautions, NPO status , Patient's Chart, lab work & pertinent test results  Airway Mallampati: II  TM Distance: >3 FB   Mouth opening: Limited Mouth Opening  Dental  (+) Teeth Intact, Dental Advisory Given   Pulmonary neg pulmonary ROS,    breath sounds clear to auscultation       Cardiovascular negative cardio ROS   Rhythm:Regular Rate:Normal     Neuro/Psych PSYCHIATRIC DISORDERS Anxiety Depression negative neurological ROS     GI/Hepatic Neg liver ROS, GERD  Medicated,  Endo/Other  negative endocrine ROS  Renal/GU Renal disease     Musculoskeletal negative musculoskeletal ROS (+)   Abdominal Normal abdominal exam  (+)   Peds  Hematology negative hematology ROS (+)   Anesthesia Other Findings   Reproductive/Obstetrics                            Anesthesia Physical Anesthesia Plan  ASA: III  Anesthesia Plan: MAC   Post-op Pain Management:    Induction: Intravenous  PONV Risk Score and Plan: 0 and Propofol infusion  Airway Management Planned: Natural Airway and Simple Face Mask  Additional Equipment: None  Intra-op Plan:   Post-operative Plan:   Informed Consent: I have reviewed the patients History and Physical, chart, labs and discussed the procedure including the risks, benefits and alternatives for the proposed anesthesia with the patient or authorized representative who has indicated his/her understanding and acceptance.   Patient has DNR.     Plan Discussed with: CRNA  Anesthesia Plan Comments:        Anesthesia Quick Evaluation

## 2020-03-21 NOTE — Progress Notes (Signed)
Overall, Caitlin Greene seems to be improving.  She is now up on 6 E.  She does not feel as nauseated.  She seems to be eating a little bit better.  The MRI of the brain showed a 13 mm lesion next to the clivus.  I do not think this would be causing her any problems.  However, I probably would see if radiation therapy could be used.  This area would be amenable to stereotactic radiosurgery.  She is having constipation.  Maybe, constipation is part of the problem.  I will increase the MiraLAX to twice a day.  Her CA 27.29 is improving nicely.  As such, the Arimidex and Leslee Home that she has been taking certainly is working.  If, these medications are causing her issue with nausea and vomiting, then we could easily make a switch to Femara and ribociclib.  She is really not complain of any pain right now.  There are no labs yet for today.  Her labs look pretty good.  She is afebrile.  Temperature 106/76.  Her lungs are clear bilaterally.  Cardiac exam regular rate and rhythm.  Abdomen is soft.  Bowel sounds are present.  She has no palpable liver or spleen tip.  Extremities shows no clubbing, cyanosis or edema.  I am glad that she is feeling a little better.  I am not sure if she may have had some kind of viral syndrome causing this nausea and vomiting.  She has been under a lot of stress.  We did start her on Marinol.  Hopefully this will help with anxiety and nausea.  I still think she needs a few more days to make sure that she is able to eat and get stronger.  Hopefully, we will see what the iron levels are.  If her iron level is low, then IV iron can certainly help.  I know that she will get fantastic care from all the staff up on 6 E.  Lattie Haw, MD  Oswaldo Milian 41:10

## 2020-03-21 NOTE — Progress Notes (Signed)
PT Cancellation Note  Patient Details Name: Caitlin Greene MRN: [id number] DOB: Sep 02, 1965   Cancelled Treatment:     EGD today.  Will attempt to see another day as schedule permits.  Pt has been evalutaed with rec for no post acute PT needs.   Rica Koyanagi  PTA Acute  Rehabilitation Services Pager      [phone] Office      [phone]

## 2020-03-21 NOTE — Op Note (Addendum)
[facility] Patient Name: Caitlin Greene Procedure Date: 03/21/2020 MRN: [id number] Attending MD: Thornton Park MD, MD Date of Birth: 06/12/1965 CSN: [id number] Age: 54 Admit Type: Inpatient Procedure:                Upper GI endoscopy Indications:              Nausea with vomiting Providers:                Thornton Park MD, MD, Benay Pillow, RN, Erenest Rasher, RN, Laverda Sorenson, Technician, Courtney Heys                            [place], CRNA Referring MD:              Medicines:                Monitored Anesthesia Care Complications:            No immediate complications. Estimated blood loss:                            Minimal. Estimated Blood Loss:     Estimated blood loss was minimal. Procedure:                Pre-Anesthesia Assessment:                           - Prior to the procedure, a History and Physical                            was performed, and patient medications and                            allergies were reviewed. The patient's tolerance of                            previous anesthesia was also reviewed. The risks                            and benefits of the procedure and the sedation                            options and risks were discussed with the patient.                            All questions were answered, and informed consent                            was obtained. Prior Anticoagulants: The patient has                            taken no previous anticoagulant or antiplatelet                            agents. ASA Grade  Assessment: III - A patient with                            severe systemic disease. After reviewing the risks                            and benefits, the patient was deemed in                            satisfactory condition to undergo the procedure.                           After obtaining informed consent, the endoscope was                            passed under direct vision.  Throughout the                            procedure, the patient's blood pressure, pulse, and                            oxygen saturations were monitored continuously. The                            GIF-H190 ([id number]) Olympus gastroscope was                            introduced through the mouth, and advanced to the                            third part of duodenum. The upper GI endoscopy was                            accomplished without difficulty. The patient                            tolerated the procedure well. Scope In: Scope Out: Findings:      The distal esophagus was mildly tortuous.      The examined esophagus was normal. Biopsies were obtained from the       proximal and distal esophagus with cold forceps for histology.      The entire examined gasrric mucosa appears normal. Biopsies were taken       from the antrum, body, and fundus with a cold forceps for histology.       Estimated blood loss was minimal.      A medium sized hiatal hernia was present. No Cameron's erosions seen.      The exam was otherwise without abnormality. Impression:               - Midly tortuous distal esophagus.                           - Normal esophagus. Biopsied for reflux and  eosinophilic esophagitis.                           - Normal stomach. Biopsied for H pylori.                           - Medium sized hiatal hernia.                           - The examination was otherwise normal. Moderate Sedation:      Not Applicable - Patient had care per Anesthesia. Recommendation:           - Return patient to hospital ward for ongoing care.                           - Advance diet as tolerated.                           - Continue present medications.                           - Await pathology results.                           - Maximize symptomatic management of nausea. Procedure Code(s):        --- Professional ---                           [phone],  Esophagogastroduodenoscopy, flexible,                            transoral; with biopsy, single or multiple Diagnosis Code(s):        --- Professional ---                           Q39.9, Congenital malformation of esophagus,                            unspecified                           K44.9, Diaphragmatic hernia without obstruction or                            gangrene                           R11.2, Nausea with vomiting, unspecified CPT copyright 2019 [employer]. All rights reserved. The codes documented in this report are preliminary and upon coder review may  be revised to meet current compliance requirements. Thornton Park MD, MD 03/21/2020 12:23:40 PM This report has been signed electronically. Number of Addenda: 0

## 2020-03-21 NOTE — Consult Note (Signed)
Consultation  Referring Provider: Dr. Erlinda Hong Primary Care Physician:  Biagio Borg, MD Primary Gastroenterologist:   Dr. Ardis Hughs      Reason for Consultation: Nausea and vomiting            HPI:   Caitlin Greene is a 54 y.o. female with a history of metastatic breast cancer diagnosed 30 years ago, status post right mastectomy recently found to have metastasis to the bone and possibly lung on anastrozole and Ibrance who follows at [facility], who presented to the ED on 03/19/2020 with generalized weakness, nausea, vomiting and shortness of breath for the past couple of weeks.    Today, the patient explains that she started chemo 2 weeks ago and has had some nausea and vomiting off and on for a few weeks but this now it has been constant over the past 10 days with difficulty keeping anything down.  Tells me that sometimes it comes up just after eating it and other times will be a few hours later.  Associated symptoms include a weight loss of around 55 pounds over the past month.    Also describes some lower abdominal cramping and tells me she has not had a bowel movement for the past 48 hours which is somewhat unlike her.  Describes that she is radiating towards constipation recently.  Has never had a colonoscopy and tells me she needs one.    Denies fever, chills, blood in her stool or symptoms that awaken her from sleep.  ED course: Tachypneic initially which resolved, hemodynamically stable, potassium 3.4, WBC 2.4, COVID-19 negative, chest x-ray with mild bibasilar subsegmental atelectasis or infiltrate, CTA chest negative for PE and noted atelectasis in known metastatic disease, CT head with known suspected bony metastatic disease  GI history: Distant with Dr. Ardis Hughs, cannot see records  Past Medical History:  Diagnosis Date  . ALLERGIC RHINITIS   . ANEMIA-IRON DEFICIENCY   . ANXIETY   . BREAST CANCER, HX OF 01/21/2007   at 54yo  . DEPRESSION   . GERD   . HYPERLIPIDEMIA   . RENAL  CALCULUS     Past Surgical History:  Procedure Laterality Date  . BREAST ENHANCEMENT SURGERY    . MASTECTOMY     right  . TEMPOROMANDIBULAR JOINT SURGERY     Left  . TONSILLECTOMY      Family History  Problem Relation Age of Onset  . Colon polyps Mother   . Cancer Mother        Uterine Cancer  . Hypertension Mother   . Dementia Mother   . Colon polyps Father   . COPD Father        smoked  . Hypertension Other   . Diabetes Other   . Asthma Sister      Social History   Tobacco Use  . Smoking status: Never Smoker  . Smokeless tobacco: Never Used  Vaping Use  . Vaping Use: Never used  Substance Use Topics  . Alcohol use: No    Alcohol/week: 0.0 standard drinks  . Drug use: No    Prior to Admission medications   Medication Sig Start Date End Date Taking? Authorizing Provider  acetaminophen (TYLENOL) 650 MG CR tablet Take 650 mg by mouth every 8 (eight) hours as needed for pain.   Yes [provider]  anastrozole (ARIMIDEX) 1 MG tablet Take 1 mg by mouth daily. 02/24/20  Yes [provider]  CALCIUM PO Take 1 tablet by mouth daily.  Yes [provider]  FLUoxetine (PROZAC) 20 MG capsule TAKE 1 CAPSULE BY MOUTH EVERY DAY 09/19/19  Yes Biagio Borg, MD  FLUoxetine (PROZAC) 40 MG capsule Take 40 mg by mouth daily. 03/10/20  Yes [provider]  hydrOXYzine (ATARAX/VISTARIL) 25 MG tablet Take 25 mg by mouth 3 (three) times daily as needed for anxiety. 12/31/19  Yes [provider]  IBRANCE 100 MG tablet Take 100 mg by mouth See admin instructions. 100mg  daily on day 3-15 of cycle. 7 days off.  Repeat. 03/13/20  Yes [provider]  methadone (DOLOPHINE) 5 MG tablet Take 5 mg by mouth at bedtime. 02/19/20  Yes [provider]  omeprazole (PRILOSEC) 20 MG capsule Take 20 mg by mouth daily. 01/28/20  Yes [provider]  ondansetron (ZOFRAN) 4 MG tablet Take 4 mg by mouth every 4 (four) hours as needed for  nausea/vomiting. 02/13/20  Yes [provider]  oxyCODONE (OXYCONTIN) 20 mg 12 hr tablet Take 10-20 mg by mouth See admin instructions. 10mg  in the morning 20mg  at night   Yes [provider]  SM SENNA LAXATIVE 8.6 MG tablet Take 8.6 mg by mouth daily. 10/30/19  Yes [provider]  cyclobenzaprine (FLEXERIL) 10 MG tablet Take 1 tablet (10 mg total) by mouth 3 (three) times daily as needed for muscle spasms. Patient not taking: Reported on 03/19/2020 10/22/19   Kandra Nicolas, MD  oxyCODONE (OXY IR/ROXICODONE) 5 MG immediate release tablet Take 5 mg by mouth every 4 (four) hours as needed for pain. 03/12/20   [provider]    Current Facility-Administered Medications  Medication Dose Route Frequency Provider Last Rate Last Admin  . acetaminophen (TYLENOL) tablet 650 mg  650 mg Oral Q6H PRN Harold Hedge, MD       Or  . acetaminophen (TYLENOL) suppository 650 mg  650 mg Rectal Q6H PRN Harold Hedge, MD      . dextrose 5 % in lactated ringers infusion   Intravenous Continuous [name], MD 75 mL/hr at 03/21/20 0616 Rate Verify at 03/21/20 0616  . dronabinol (MARINOL) capsule 5 mg  5 mg Oral BID AC [name], MD   5 mg at 03/20/20 1146  . enoxaparin (LOVENOX) injection 40 mg  40 mg Subcutaneous Q24H Harold Hedge, MD   40 mg at 03/20/20 1642  . HYDROmorphone (DILAUDID) injection 0.5 mg  0.5 mg Intravenous Q2H PRN Harold Hedge, MD   0.5 mg at 03/20/20 0236  . LORazepam (ATIVAN) injection 1 mg  1 mg Intravenous On Call Florencia Reasons, MD      . methadone (DOLOPHINE) tablet 5 mg  5 mg Oral QHS Harold Hedge, MD   5 mg at 03/20/20 2222  . ondansetron (ZOFRAN) tablet 4 mg  4 mg Oral Q6H PRN Harold Hedge, MD       Or  . ondansetron [facility]) injection 4 mg  4 mg Intravenous Q6H PRN Harold Hedge, MD   4 mg at 03/20/20 0235  . oxyCODONE (OXYCONTIN) 12 hr tablet 10 mg  10 mg Oral Daily Harold Hedge, MD   10 mg at 03/20/20 1124   And  . oxyCODONE  (OXYCONTIN) 12 hr tablet 20 mg  20 mg Oral QPM Harold Hedge, MD   20 mg at 03/20/20 1837  . pantoprazole (PROTONIX) EC tablet 40 mg  40 mg Oral QPM Harold Hedge, MD   40 mg at 03/20/20  1837  . polyethylene glycol (MIRALAX / GLYCOLAX) packet 17 g  17 g Oral BID [name], MD      . potassium chloride 10 mEq in 100 mL IVPB  10 mEq Intravenous Q1 Hr x 4 Florencia Reasons, MD      . senna-docusate (Senokot-S) tablet 1 tablet  1 tablet Oral BID Florencia Reasons, MD   1 tablet at 03/20/20 2222  . sodium chloride flush (NS) 0.9 % injection 3 mL  3 mL Intravenous Q12H Harold Hedge, MD   3 mL at 03/20/20 2222    Allergies as of 03/19/2020 - Review Complete 03/19/2020  Allergen Reaction Noted  . Amoxicillin-pot clavulanate Hives 11/13/2010  . Codeine Nausea And Vomiting      Review of Systems:    Constitutional: No fever or chills Skin: No rash  Cardiovascular: No chest pain Respiratory: No SOB Gastrointestinal: See HPI and otherwise negative Genitourinary: No dysuria Neurological: No headache, dizziness or syncope Musculoskeletal: No new muscle or joint pain Hematologic: No bleeding Psychiatric: No history of depression or anxiety    Physical Exam:  Vital signs in last 24 hours: Temp:  [97.9 F (36.6 C)-98.8 F (37.1 C)] 98 F (36.7 C) (11/13 0835) Pulse Rate:  [69-118] 69 (11/13 0835) Resp:  [11-35] 11 (11/13 0835) BP: (96-130)/(64-99) 105/76 (11/13 0835) SpO2:  [90 %-100 %] 93 % (11/13 0835) Weight:  [73.4 kg] 73.4 kg (11/13 0524) Last BM Date: 03/19/20 General:   Pleasant Caucasian female who appears to be in NAD, Well developed, Well nourished, alert and cooperative Head:  Normocephalic and atraumatic. Eyes:   PEERL, EOMI. No icterus. Conjunctiva pink. Ears:  Normal auditory acuity. Neck:  Supple Throat: Oral cavity and pharynx without inflammation, swelling or lesion.  Lungs: Respirations even and unlabored. Lungs clear to auscultation bilaterally.   No wheezes, crackles, or  rhonchi.  Heart: Normal S1, S2. No MRG. Regular rate and rhythm. No peripheral edema, cyanosis or pallor.  Abdomen:  Soft, nondistended,mild generalized ttp. No rebound or guarding. Normal bowel sounds. No appreciable masses or hepatomegaly. Rectal:  Not performed.  Msk:  Symmetrical without gross deformities. Peripheral pulses intact.  Extremities:  Without edema, no deformity or joint abnormality.  Neurologic:  Alert and  oriented x4;  grossly normal neurologically.  Skin:   Dry and intact without significant lesions or rashes. Psychiatric: Demonstrates good judgement and reason without abnormal affect or behaviors.   LAB RESULTS: Recent Labs    03/19/20 0819 03/20/20 0240 03/21/20 0748  WBC 2.4* 2.6* 2.3*  HGB 13.3 10.4* 10.7*  HCT 37.2 30.5* 31.6*  PLT 206 143* 141*   BMET Recent Labs    03/19/20 0910 03/19/20 0910 03/20/20 0237 03/20/20 0240 03/21/20 0748  NA 136  --  139  --  136  K 3.4*  --  3.7  --  3.3*  CL 105  --  110  --  105  CO2 19*  --  23  --  27  GLUCOSE 103*  --  71  --  88  BUN 14  --  7  --  6  CREATININE 0.79   < > 0.63 0.58 0.73  CALCIUM 8.9  --  7.5*  --  8.1*   < > = values in this interval not displayed.   LFT Recent Labs    03/20/20 0237  PROT 5.9*  ALBUMIN 3.2*  AST 14*  ALT 18  ALKPHOS 34*  BILITOT 0.8    STUDIES: CT Head  W or Wo Contrast  Result Date: 03/19/2020 CLINICAL DATA:  History of skull base metastatic disease. Generalized weakness over the last 2 weeks. Metastatic breast cancer. EXAM: CT HEAD WITHOUT AND WITH CONTRAST TECHNIQUE: Contiguous axial images were obtained from the base of the skull through the vertex without and with intravenous contrast CONTRAST:  157mL OMNIPAQUE IOHEXOL 350 MG/ML SOLN COMPARISON:  Cervical MRI 10/24/2019. Bone scan 02/17/2020. Head CT 12/02/2019 FINDINGS: Brain: The brain has normal appearance without evidence of atrophy, old or recent infarction, mass lesion, hemorrhage, hydrocephalus or  extra-axial collection. No abnormal contrast enhancement occurs. Vascular: No abnormal vascular finding. Skull: 13 x 9 x 9 mm sclerotic focus within the right side of the clivus without evidence lytic bone destruction or extraosseous tumor. This could be related to bony metastatic disease or could be a focus of fibrous dysplasia. No second regional lesion is identified. Sinuses/Orbits: Clear sinuses as seen. Limited orbital visualization is negative. Other: None IMPRESSION: 1. 13 x 9 x 9 mm sclerotic focus within the right side of the clivus without evidence of lytic bone destruction or extraosseous tumor. This could be related to bony metastatic disease or could be a focus of fibrous dysplasia. No second regional lesion is identified. 2. Normal appearance of the brain itself. Electronically Signed   By: Nelson Chimes M.D.   On: 03/19/2020 11:40   CT Angio Chest PE W and/or Wo Contrast  Result Date: 03/19/2020 CLINICAL DATA:  High probability for pulmonary embolism. Shortness of breath EXAM: CT ANGIOGRAPHY CHEST WITH CONTRAST TECHNIQUE: Multidetector CT imaging of the chest was performed using the standard protocol during bolus administration of intravenous contrast. Multiplanar CT image reconstructions and MIPs were obtained to evaluate the vascular anatomy. CONTRAST:  162mL OMNIPAQUE IOHEXOL 350 MG/ML SOLN COMPARISON:  06/04/2014 FINDINGS: Cardiovascular: Normal heart size. No pericardial effusion. No pulmonary artery filling defects. Normal aorta Mediastinum/Nodes: Large sliding hiatal hernia. Lungs/Pleura: Dependent streaky opacity consistent with atelectasis. There is no edema, consolidation, effusion, or pneumothorax. Upper Abdomen: No acute finding Musculoskeletal: No acute finding. T6 and T10 compression fracture with advanced height loss at T10. Both of these levels have undergone cement augmentation. Multifocal vertebral metastatic disease with similar extent when compared to a thoracic MRI October 24, 2019. No evidence of acute fracture. Other: Intra and extracapsular breast implant rupture on the right, chronic when compared to 2016. Review of the MIP images confirms the above findings. IMPRESSION: 1. Negative for pulmonary embolism. 2. Low volume chest with atelectasis at the bases. 3. Known osseous metastatic disease.  No acute fracture. 4. Large sliding hiatal hernia. Electronically Signed   By: Monte Fantasia M.D.   On: 03/19/2020 11:45   MR BRAIN W WO CONTRAST  Result Date: 03/20/2020 CLINICAL DATA:  Metastatic breast cancer. Evaluate clivus involvement. EXAM: MRI HEAD WITHOUT AND WITH CONTRAST TECHNIQUE: Multiplanar, multiecho pulse sequences of the brain and surrounding structures were obtained without and with intravenous contrast. CONTRAST:  3mL GADAVIST GADOBUTROL 1 MMOL/ML IV SOLN COMPARISON:  Head CT yesterday. Cervical MRI 10/24/2019. Bone scan 02/17/2020. Head CT 12/02/2019. FINDINGS: Brain: Diffusion imaging does not show any acute or subacute infarction. The brainstem and cerebellum are normal. Cerebral hemispheres are normal. No evidence of metastatic disease to the brain or leptomeninges. There is some artifact related to previous temporomandibular joint region surgery on the left. No hemorrhage, hydrocephalus or extra-axial collection. No abnormal contrast enhancement. Vascular: Major vessels at the base of the brain show flow. Skull and upper cervical spine: As  shown previously, there is a 13 mm region of diminished T1 and T2 signal in the right side of the clivus. This could be a metastasis or fibrous dysplasia. No lytic destructive component. No extraosseous component. No second bone lesion identified. Sinuses/Orbits: Clear/normal Other: None IMPRESSION: 1. Normal appearance of the brain itself. No evidence of metastatic disease to the brain or leptomeninges. 2. 13 mm region of diminished T1 and T2 signal in the right side of the clivus. This could be a metastasis or fibrous  dysplasia. No extraosseous component. No second bone lesion identified. Electronically Signed   By: Nelson Chimes M.D.   On: 03/20/2020 11:12   DG ESOPHAGUS W SINGLE CM (SOL OR THIN BA)  Result Date: 03/20/2020 CLINICAL DATA:  53 year old female with history of globus sensation while ingesting solid food for the past 2-3 weeks. History of gastroesophageal reflux disease. EXAM: ESOPHOGRAM / BARIUM SWALLOW / BARIUM TABLET STUDY TECHNIQUE: Combined double contrast and single contrast examination performed using effervescent crystals, thick barium liquid, and thin barium liquid. The patient was observed with fluoroscopy swallowing a 13 mm barium sulphate tablet. FLUOROSCOPY TIME:  Fluoroscopy Time:  1 minutes and 36 seconds Radiation Exposure Index (if provided by the fluoroscopic device): 12.5 mGy COMPARISON:  None. FINDINGS: Double contrast images of the esophagus demonstrate a normal appearance of the esophageal mucosa. Multiple single swallow attempts were observed which demonstrated normal esophageal motility. Full column esophagram demonstrated no esophageal mass, stricture or esophageal ring. Large hiatal hernia. A barium tablet was administered, which passed readily into the stomach. IMPRESSION: 1. Large hiatal hernia. 2. Normal esophageal motility. Electronically Signed   By: [name] M.D.   On: 03/20/2020 15:46    Impression / Plan:   Impression: 1.  Nausea and vomiting: Evaluation with speech therapy who discovered no oropharyngeal dysphagia, esophagram yesterday with large hiatal hernia, normal esophageal motility; consider obstruction versus other versus more likely chemotherapy 2.  Generalized weakness: With above 3.  Metastatic breast cancer: Currently on Anastrozole and Ibrance which has been held this admission 4.  Weight loss: Reports 55 pounds over the past month  Plan: 1.  Scheduled patient for an EGD today around 12:00 with Dr. Tarri Glenn.  Did discuss risks, benefits,  limitations and alternatives and patient agrees to proceed. 2.  Continue antiemetics for now 3.  Please await any further recommendations from Dr. Tarri Glenn after time of procedure.  Thank you for your kind consultation, we will continue to follow.  Lavone Nian [facility]  03/21/2020, 9:28 AM

## 2020-03-22 LAB — COMPREHENSIVE METABOLIC PANEL
ALT: 15 U/L (ref 0–44)
AST: 13 U/L — ABNORMAL LOW (ref 15–41)
Albumin: 3.2 g/dL — ABNORMAL LOW (ref 3.5–5.0)
Alkaline Phosphatase: 35 U/L — ABNORMAL LOW (ref 38–126)
Anion gap: 6 (ref 5–15)
BUN: <5 mg/dL — ABNORMAL LOW (ref 6–20)
CO2: 25 mmol/L (ref 22–32)
Calcium: 8 mg/dL — ABNORMAL LOW (ref 8.9–10.3)
Chloride: 108 mmol/L (ref 98–111)
Creatinine, Ser: 0.64 mg/dL (ref 0.44–1.00)
GFR, Estimated: >60 mL/min (ref >60)
Glucose, Bld: 84 mg/dL (ref 70–99)
Potassium: 3.7 mmol/L (ref 3.5–5.1)
Sodium: 139 mmol/L (ref 135–145)
Total Bilirubin: 0.8 mg/dL (ref 0.3–1.2)
Total Protein: 5.6 g/dL — ABNORMAL LOW (ref 6.5–8.1)

## 2020-03-22 LAB — CBC WITH DIFFERENTIAL/PLATELET
Abs Immature Granulocytes: 0.01 10*3/uL (ref 0.00–0.07)
Basophils Absolute: 0 10*3/uL (ref 0.0–0.1)
Basophils Relative: 1 %
Eosinophils Absolute: 0 10*3/uL (ref 0.0–0.5)
Eosinophils Relative: 1 %
HCT: 31.4 % — ABNORMAL LOW (ref 36.0–46.0)
Hemoglobin: 10.8 g/dL — ABNORMAL LOW (ref 12.0–15.0)
Immature Granulocytes: 0 %
Lymphocytes Relative: 43 %
Lymphs Abs: 1 10*3/uL (ref 0.7–4.0)
MCH: 36.2 pg — ABNORMAL HIGH (ref 26.0–34.0)
MCHC: 34.4 g/dL (ref 30.0–36.0)
MCV: 105.4 fL — ABNORMAL HIGH (ref 80.0–100.0)
Monocytes Absolute: 0.2 10*3/uL (ref 0.1–1.0)
Monocytes Relative: 6 %
Neutro Abs: 1.2 10*3/uL — ABNORMAL LOW (ref 1.7–7.7)
Neutrophils Relative %: 49 %
Platelets: 144 10*3/uL — ABNORMAL LOW (ref 150–400)
RBC: 2.98 MIL/uL — ABNORMAL LOW (ref 3.87–5.11)
RDW: 20.2 % — ABNORMAL HIGH (ref 11.5–15.5)
WBC: 2.4 10*3/uL — ABNORMAL LOW (ref 4.0–10.5)
nRBC: 0 % (ref 0.0–0.2)

## 2020-03-22 LAB — MAGNESIUM: Magnesium: 2 mg/dL (ref 1.7–2.4)

## 2020-03-22 MED ORDER — ADULT MULTIVITAMIN W/MINERALS CH
1.0000 | ORAL_TABLET | Freq: Every day | ORAL | Status: DC
  Administered 2020-03-22 – 2020-03-24 (×3): 1 via ORAL
  Filled 2020-03-22 (×4): qty 1

## 2020-03-22 MED ORDER — ENSURE ENLIVE PO LIQD
237.0000 mL | Freq: Three times a day (TID) | ORAL | Status: DC
  Administered 2020-03-22 – 2020-03-23 (×3): 237 mL via ORAL

## 2020-03-22 MED ORDER — MINERAL OIL RE ENEM
1.0000 | ENEMA | Freq: Once | RECTAL | Status: AC
  Administered 2020-03-22: 1 via RECTAL
  Filled 2020-03-22: qty 1

## 2020-03-22 MED ORDER — LIP MEDEX EX OINT
TOPICAL_OINTMENT | CUTANEOUS | Status: AC
  Filled 2020-03-22: qty 7

## 2020-03-22 NOTE — Progress Notes (Signed)
PROGRESS NOTE    Caitlin Greene  RJJ:[id number] DOB: 07-13-1965 DOA: 03/19/2020 PCP: Biagio Borg, MD    Chief Complaint  Patient presents with  . Weakness  . 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 s/p right mastectomy recently found to have metastases to bone and possibly lung on anastrozole and Ibrance follows at [facility], GERD, chronic vertigo, anxiety and depression, hyperlipidemia who presented to the ED with concern for generalized weakness, nausea, vomiting and shortness of breath for the past couple weeks  Subjective:  She is on full liquid diet, but only tolerated 10% of the diet,  Continue feeling nauseous,  Reports vomited last night after trying to move bowels Reports constipation not relieved by stool softener, want to proceed with enema      Assessment & Plan:   Principal Problem:   Nausea & vomiting Active Problems:   Generalized weakness   Metastatic breast cancer ([place])   Cancer related pain   Prolonged QT interval   Dysphagia   N&V (nausea and vomiting)   Constipation   Intractable nausea and vomiting ( started to have intermittent n/v since June this year, but became constant in the last 10 days), dehydration, generalized weakness, 55lbs weight loss since June -Anastrozole and Ibrance stopped due to possible side effect -also has intermittent vertigo -No oropharyngeal dysphagia per speech eval , EGD esophagus showed hiatal hernia ,GI consulted for EGD -Appear dehydrated, with poor oral intake, continue  d5LR infusion, continue as needed antiemetics ,advance diet as tolerated -nutrition consulted  Constipation Not relieved by stool softener, enema x1, encourage activity   Hypokalemia: Replaced and improved, monitor , mag 2.0  Neutropenia -Possible cancer treatment related, anastrozole and Ibrance on hold monitor  Macrocytic anemia -Appear new,  -tbilin  wnl -low B12 level, TSH unremarkable, iron study and retic count consistent with anemia of chronic disease -Does not appear to have overt signs of blood loss, check F OBT -Start B12 supplement  Prolonged QTC QTC 491 Avoid QT prolongation agent, keep potassium above 4, magnesium above 2 Repeat EKG in the morning qtc 483  Metastatic breast cancer/cancer related pain -Management per oncology   Sinus congestion; start Flonase and Mucinex.  Consider antibiotics if symptoms does not improve, as needed Antivert  Body mass index is 25.21 kg/m.      DVT prophylaxis: enoxaparin (LOVENOX) injection 40 mg Start: 03/22/20 1600   Code Status:DNR Family Communication: Sister at bedside with permission on 11/13 Disposition:   Status is: Inpatient  Dispo: The patient is from: home              Anticipated d/c is to: home              Anticipated d/c date is: TBD, pending symptom involvement,                Consultants:   Oncology  GI  Procedures:   EGD  Antimicrobials:    none    Objective: Vitals:   03/21/20 1240 03/21/20 1334 03/21/20 2223 03/22/20 0615  BP: 118/69 110/76 98/65 118/74  Pulse: 69 61 86 73  Resp: 11 10 14 18   Temp:  98 F (36.7 C) 97.7 F (36.5 C) 97.6 F (36.4 C)  TempSrc:  Oral Oral Oral  SpO2: 95% 96% 93% 96%  Weight:    75.2 kg  Height:        Intake/Output Summary (  Last 24 hours) at 03/22/2020 1349 Last data filed at 03/22/2020 1202 Gross per 24 hour  Intake 783.1 ml  Output 200 ml  Net 583.1 ml   Filed Weights   03/19/20 0816 03/21/20 0524 03/22/20 0615  Weight: 93.4 kg 73.4 kg 75.2 kg    Examination:  General exam: calm, NAD Respiratory system: Clear to auscultation. Respiratory effort normal. Cardiovascular system: S1 & S2 heard, RRR. No pedal edema. Gastrointestinal system: Abdomen is nondistended, soft and nontender. Normal bowel sounds heard. Central nervous system: Alert and oriented. No focal neurological  deficits. Extremities: Symmetric 5 x 5 power. Skin: No rashes, lesions or ulcers Psychiatry: Judgement and insight appear normal. Mood & affect appropriate.     Data Reviewed: I have personally reviewed following labs and imaging studies  CBC: Recent Labs  Lab 03/19/20 0819 03/20/20 0240 03/21/20 0748 03/22/20 0647  WBC 2.4* 2.6* 2.3* 2.4*  NEUTROABS 1.2*  --  0.9* 1.2*  HGB 13.3 10.4* 10.7* 10.8*  HCT 37.2 30.5* 31.6* 31.4*  MCV 99.2 104.1* 103.9* 105.4*  PLT 206 143* 141* 144*    Basic Metabolic Panel: Recent Labs  Lab 03/19/20 0910 03/20/20 0237 03/20/20 0240 03/21/20 0748 03/22/20 0647  NA 136 139  --  136 139  K 3.4* 3.7  --  3.3* 3.7  CL 105 110  --  105 108  CO2 19* 23  --  27 25  GLUCOSE 103* 71  --  88 84  BUN 14 7  --  6 <5*  CREATININE 0.79 0.63 0.58 0.73 0.64  CALCIUM 8.9 7.5*  --  8.1* 8.0*  MG  --   --   --  2.0 2.0    GFR: Estimated Creatinine Clearance: 79.2 mL/min (by C-G formula based on SCr of 0.64 mg/dL).  Liver Function Tests: Recent Labs  Lab 03/19/20 0910 03/20/20 0237 03/22/20 0647  AST 24 14* 13*  ALT 23 18 15   ALKPHOS 46 34* 35*  BILITOT 1.2 0.8 0.8  PROT 8.0 5.9* 5.6*  ALBUMIN 4.4 3.2* 3.2*    CBG: No results for input(s): GLUCAP in the last 168 hours.   Recent Results (from the past 240 hour(s))  Respiratory Panel by RT PCR (Flu A&B, Covid) - Nasopharyngeal Swab     Status: None   Collection Time: 03/19/20  9:10 AM   Specimen: Nasopharyngeal Swab  Result Value Ref Range Status   SARS Coronavirus 2 by RT PCR NEGATIVE NEGATIVE Final    Comment: (NOTE) SARS-CoV-2 target nucleic acids are NOT DETECTED.  The SARS-CoV-2 RNA is generally detectable in upper respiratoy specimens during the acute phase of infection. The lowest concentration of SARS-CoV-2 viral copies this assay can detect is 131 copies/mL. A negative result does not preclude SARS-Cov-2 infection and should not be used as the sole basis for treatment  or other patient management decisions. A negative result may occur with  improper specimen collection/handling, submission of specimen other than nasopharyngeal swab, presence of viral mutation(s) within the areas targeted by this assay, and inadequate number of viral copies (<131 copies/mL). A negative result must be combined with clinical observations, patient history, and epidemiological information. The expected result is Negative.  Fact Sheet for Patients:  PinkCheek.be  Fact Sheet for Healthcare Providers:  GravelBags.it  This test is no t yet approved or cleared by the [place] FDA and  has been authorized for detection and/or diagnosis of SARS-CoV-2 by FDA under an Emergency Use Authorization (EUA). This EUA will remain  in effect (meaning this test can be used) for the duration of the COVID-19 declaration under Section 564(b)(1) of the Act, 21 U.S.C. section 360bbb-3(b)(1), unless the authorization is terminated or revoked sooner.     Influenza A by PCR NEGATIVE NEGATIVE Final   Influenza B by PCR NEGATIVE NEGATIVE Final    Comment: (NOTE) The Xpert Xpress SARS-CoV-2/FLU/RSV assay is intended as an aid in  the diagnosis of influenza from Nasopharyngeal swab specimens and  should not be used as a sole basis for treatment. Nasal washings and  aspirates are unacceptable for Xpert Xpress SARS-CoV-2/FLU/RSV  testing.  Fact Sheet for Patients: PinkCheek.be  Fact Sheet for Healthcare Providers: GravelBags.it  This test is not yet approved or cleared by the [place] FDA and  has been authorized for detection and/or diagnosis of SARS-CoV-2 by  FDA under an Emergency Use Authorization (EUA). This EUA will remain  in effect (meaning this test can be used) for the duration of the  Covid-19 declaration under Section 564(b)(1) of the Act, 21  U.S.C.  section 360bbb-3(b)(1), unless the authorization is  terminated or revoked. Performed at [facility], [place] [address] [place], [place] [address]          Radiology Studies: DG ESOPHAGUS W SINGLE CM (SOL OR THIN BA)  Result Date: 03/20/2020 CLINICAL DATA:  54 year old female with history of globus sensation while ingesting solid food for the past 2-3 weeks. History of gastroesophageal reflux disease. EXAM: ESOPHOGRAM / BARIUM SWALLOW / BARIUM TABLET STUDY TECHNIQUE: Combined double contrast and single contrast examination performed using effervescent crystals, thick barium liquid, and thin barium liquid. The patient was observed with fluoroscopy swallowing a 13 mm barium sulphate tablet. FLUOROSCOPY TIME:  Fluoroscopy Time:  1 minutes and 36 seconds Radiation Exposure Index (if provided by the fluoroscopic device): 12.5 mGy COMPARISON:  None. FINDINGS: Double contrast images of the esophagus demonstrate a normal appearance of the esophageal mucosa. Multiple single swallow attempts were observed which demonstrated normal esophageal motility. Full column esophagram demonstrated no esophageal mass, stricture or esophageal ring. Large hiatal hernia. A barium tablet was administered, which passed readily into the stomach. IMPRESSION: 1. Large hiatal hernia. 2. Normal esophageal motility. Electronically Signed   By: [name] M.D.   On: 03/20/2020 15:46        Scheduled Meds: . dronabinol  5 mg Oral BID AC  . enoxaparin (LOVENOX) injection  40 mg Subcutaneous Q24H  . feeding supplement  237 mL Oral TID BM  . fluticasone  2 spray Each Nare Daily  . guaiFENesin  600 mg Oral BID  . linaclotide  145 mcg Oral QAC breakfast  . methadone  5 mg Oral QHS  . mineral oil  1 enema Rectal Once  . multivitamin with minerals  1 tablet Oral Daily  . oxyCODONE  10 mg Oral Daily   And  . oxyCODONE  20 mg Oral QPM  . pantoprazole  40 mg Oral QPM  . polyethylene glycol  17 g Oral  BID  . senna-docusate  1 tablet Oral BID  . sodium chloride flush  3 mL Intravenous Q12H  . vitamin B-12  1,000 mcg Oral Daily   Continuous Infusions: . dextrose 5% lactated ringers 50 mL/hr at 03/21/20 2240     LOS: 1 day   Time spent: 51mins,  Greater than 50% of this time was spent in counseling, explanation of diagnosis, planning of further management, and coordination of care.  I have personally reviewed and  interpreted on  03/22/2020 daily labs, tele strips, I reviewed all nursing notes, pharmacy notes, consultant notes,  vitals, pertinent old records  I have discussed plan of care as described above with RN , patient and family on 03/22/2020  Voice Recognition /Dragon dictation system was used to create this note, attempts have been made to correct errors. Please contact the author with questions and/or clarifications.   Florencia Reasons, MD PhD FACP [facility]  Available via Epic secure chat 7am-7pm for nonurgent issues Please page for urgent issues To page the attending provider between 7A-7P or the covering provider during after hours 7P-7A, please log into the web site [email or web address] and access using universal [place] password for that web site. If you do not have the password, please call the hospital operator.    03/22/2020, 1:49 PM

## 2020-03-22 NOTE — Progress Notes (Signed)
Mineral oil enema, colace and miralax given with no results yet.

## 2020-03-22 NOTE — Plan of Care (Signed)
  Problem: Education: Goal: Knowledge of General Education information will improve Description Including pain rating scale, medication(s)/side effects and non-pharmacologic comfort measures Outcome: Progressing   Problem: Health Behavior/Discharge Planning: Goal: Ability to manage health-related needs will improve Outcome: Progressing   

## 2020-03-22 NOTE — Progress Notes (Signed)
Initial Nutrition Assessment  DOCUMENTATION CODES:   Not applicable  INTERVENTION:  Ensure Enlive po TID, each supplement provides 350 kcal and 20 grams of protein  MVI daily  Magic cup TID with meals, each supplement provides 290 kcal and 9 grams of protein  NUTRITION DIAGNOSIS:   Inadequate oral intake related to nausea, vomiting as evidenced by per patient/family report.    GOAL:   Patient will meet greater than or equal to 90% of their needs    MONITOR:   PO intake, Supplement acceptance, Diet advancement, Labs, Weight trends, I & O's  REASON FOR ASSESSMENT:   Consult Assessment of nutrition requirement/status  ASSESSMENT:   Pt with a PMH significant for DCIS (diagnosed 30 years prior) s/p R mastectomy recently found to have metastases to bone and possibly lung was admitted with intractable N/V, dehydration, generalized weakness, and wt loss. PMH also includes GERD, chronic vertigo, anxiety/depression, HLD.  Pt unavailable at time of RD visit. Per H&P, pt has been experiencing intermittent N/V with associated 55lb wt loss since June of this year. N/V has become constant over the last 10 days. Pt had EGD yesterday (11/13). Per MD, pt now able to tolerate small amount of liquid with some nausea, but no vomiting. Diet being advanced as tolerated. Pt now on full liquid diet. No PO intake recorded.   Labs reviewed.  Medications: marinol, linzess, protonix, miralax, senokot-s, vitamin B12  NUTRITION - FOCUSED PHYSICAL EXAM:  Unable to perform at this time; will attempt at follow-up.  Diet Order:   Diet Order            Diet full liquid Room service appropriate? Yes; Fluid consistency: Thin  Diet effective now                 EDUCATION NEEDS:   No education needs have been identified at this time  Skin:  Skin Assessment: Reviewed RN Assessment  Last BM:  11/13 type 6  Height:   Ht Readings from Last 1 Encounters:  03/19/20 5\' 8"  (1.727 m)    Weight:    Wt Readings from Last 1 Encounters:  03/22/20 75.2 kg    BMI:  Body mass index is 25.21 kg/m.  Estimated Nutritional Needs:   Kcal:  1900-2100  Protein:  90-105 grams  Fluid:  >1.9L/d    Larkin Ina, MS, RD, LDN RD pager number and weekend/on-call pager number located in [place].

## 2020-03-23 ENCOUNTER — Inpatient Hospital Stay: Payer: BC Managed Care – PPO | Admitting: Hematology & Oncology

## 2020-03-23 ENCOUNTER — Encounter (HOSPITAL_COMMUNITY): Payer: Self-pay | Admitting: Gastroenterology

## 2020-03-23 ENCOUNTER — Other Ambulatory Visit: Payer: Self-pay | Admitting: Radiation Therapy

## 2020-03-23 ENCOUNTER — Inpatient Hospital Stay: Payer: BC Managed Care – PPO

## 2020-03-23 DIAGNOSIS — R1113 Vomiting of fecal matter: Secondary | ICD-10-CM | POA: Diagnosis not present

## 2020-03-23 LAB — CBC WITH DIFFERENTIAL/PLATELET
Abs Immature Granulocytes: 0 10*3/uL (ref 0.00–0.07)
Basophils Absolute: 0 10*3/uL (ref 0.0–0.1)
Basophils Relative: 2 %
Eosinophils Absolute: 0 10*3/uL (ref 0.0–0.5)
Eosinophils Relative: 1 %
HCT: 29.4 % — ABNORMAL LOW (ref 36.0–46.0)
Hemoglobin: 10.3 g/dL — ABNORMAL LOW (ref 12.0–15.0)
Immature Granulocytes: 0 %
Lymphocytes Relative: 47 %
Lymphs Abs: 1.2 10*3/uL (ref 0.7–4.0)
MCH: 36.4 pg — ABNORMAL HIGH (ref 26.0–34.0)
MCHC: 35 g/dL (ref 30.0–36.0)
MCV: 103.9 fL — ABNORMAL HIGH (ref 80.0–100.0)
Monocytes Absolute: 0.2 10*3/uL (ref 0.1–1.0)
Monocytes Relative: 6 %
Neutro Abs: 1.1 10*3/uL — ABNORMAL LOW (ref 1.7–7.7)
Neutrophils Relative %: 44 %
Platelets: 128 10*3/uL — ABNORMAL LOW (ref 150–400)
RBC: 2.83 MIL/uL — ABNORMAL LOW (ref 3.87–5.11)
RDW: 19.9 % — ABNORMAL HIGH (ref 11.5–15.5)
WBC: 2.5 10*3/uL — ABNORMAL LOW (ref 4.0–10.5)
nRBC: 0 % (ref 0.0–0.2)

## 2020-03-23 LAB — ESTRADIOL: Estradiol: <5 pg/mL

## 2020-03-23 LAB — COMPREHENSIVE METABOLIC PANEL
ALT: 16 U/L (ref 0–44)
AST: 15 U/L (ref 15–41)
Albumin: 3 g/dL — ABNORMAL LOW (ref 3.5–5.0)
Alkaline Phosphatase: 35 U/L — ABNORMAL LOW (ref 38–126)
Anion gap: 6 (ref 5–15)
BUN: <5 mg/dL — ABNORMAL LOW (ref 6–20)
CO2: 26 mmol/L (ref 22–32)
Calcium: 8.6 mg/dL — ABNORMAL LOW (ref 8.9–10.3)
Chloride: 107 mmol/L (ref 98–111)
Creatinine, Ser: 0.64 mg/dL (ref 0.44–1.00)
GFR, Estimated: >60 mL/min (ref >60)
Glucose, Bld: 86 mg/dL (ref 70–99)
Potassium: 3.5 mmol/L (ref 3.5–5.1)
Sodium: 139 mmol/L (ref 135–145)
Total Bilirubin: 0.8 mg/dL (ref 0.3–1.2)
Total Protein: 5.2 g/dL — ABNORMAL LOW (ref 6.5–8.1)

## 2020-03-23 MED ORDER — FLUOXETINE HCL 20 MG PO CAPS
40.0000 mg | ORAL_CAPSULE | Freq: Every day | ORAL | Status: DC
  Administered 2020-03-23 – 2020-03-24 (×2): 40 mg via ORAL
  Filled 2020-03-23 (×2): qty 2

## 2020-03-23 MED ORDER — CYANOCOBALAMIN 1000 MCG/ML IJ SOLN
1000.0000 ug | Freq: Every day | INTRAMUSCULAR | Status: DC
  Administered 2020-03-23 – 2020-03-24 (×2): 1000 ug via INTRAMUSCULAR
  Filled 2020-03-23 (×2): qty 1

## 2020-03-23 MED ORDER — SUCRALFATE 1 GM/10ML PO SUSP
1.0000 g | Freq: Three times a day (TID) | ORAL | Status: DC
  Administered 2020-03-23 – 2020-03-24 (×5): 1 g via ORAL
  Filled 2020-03-23 (×5): qty 10

## 2020-03-23 NOTE — Progress Notes (Signed)
PROGRESS NOTE    Caitlin Greene  VHQ:[id number] DOB: Oct 05, 1965 DOA: 03/19/2020 PCP: Biagio Borg, MD   Chief Complain: Weakness, nausea  Brief Narrative: Patient is a 54 year old female with history of ductal carcinoma in situ diagnosed 30 years ago status post right mastectomy, recently found to have metastasis to bone and possibly lung or anastrozole/Ibrance following at [facility], GERD, chronic vertigo, anxiety/depression, hyperlipidemia who presented to the emergency department with concerns of generalized weakness, nausea, vomiting, shortness of breath.  Patient admitted for the management of intractable nausea and vomiting and inability to tolerate oral intake. She looks much better today with improved nausea, has not vomited today.Diet advanced.  Assessment & Plan:   Principal Problem:   Nausea & vomiting Active Problems:   Generalized weakness   Metastatic breast cancer ([facility])   Cancer related pain   Prolonged QT interval   Dysphagia   N&V (nausea and vomiting)   Constipation   Intractable nausea/vomiting: Presented with dehydration, weakness, inability to tolerate anything oral 55 pounds weight loss since June this year.  GI consulted on admission.  EGD done which showed medium-sized hiatal hernia, normal esophagus/stomach.  Started on IV fluids.  Currently on full liquid.  Will advance the diet today to regular.  Nutrition also following.  Metastatic breast cancer: Follows with oncology.  Takes anastrozole and Ibrance.  Recently found to have metastasis to bone and possibly on the lung.  Constipation: Continue aggressive bowel regimen  Hypokalemia: Being monitored and supplemented  Neutropenia: Possibly cancer related.  Continue to monitor.  Macrocytic anemia: Low vitamin B12-we started on oral supplementation.  Prolonged QTc: Improved with new QTC of 483  Sinus congestion: On Flonase, Mucinex  History of depression: For fluoxitine  at home.     Nutrition  Problem: Inadequate oral intake Etiology: nausea, vomiting      DVT prophylaxis:Lovenox Code Status: DNR Family Communication: None at bedside Status is: Inpatient  Remains inpatient appropriate because:Unsafe d/c plan   Dispo: The patient is from: Home              Anticipated d/c is to: Home              Anticipated d/c date is: 1 day              Patient currently is not medically stable to d/c.     Consultants: GI  Procedures:EgD  Antimicrobials:  Anti-infectives (From admission, onward)   None      Subjective: Patient seen and examined at the bedside this afternoon.  Looks comfortable during my evaluation.  Eager to advance the diet.  Had vomiting yesterday, none today.  Nausea is better.  Objective: Vitals:   03/22/20 1409 03/22/20 2219 03/23/20 0546 03/23/20 1419  BP: 112/74 121/78 101/66 116/78  Pulse: 76 71 70 72  Resp: 14 18 14 18   Temp: 97.7 F (36.5 C) 98.2 F (36.8 C) 97.8 F (36.6 C) 98 F (36.7 C)  TempSrc: Oral Oral Oral Oral  SpO2: 95% 97% 95% 100%  Weight:      Height:        Intake/Output Summary (Last 24 hours) at 03/23/2020 1430 Last data filed at 03/23/2020 1030 Gross per 24 hour  Intake 1399.13 ml  Output --  Net 1399.13 ml   Filed Weights   03/19/20 0816 03/21/20 0524 03/22/20 0615  Weight: 93.4 kg 73.4 kg 75.2 kg    Examination:  General exam: Appears calm and comfortable ,Not in distress,average built HEENT:PERRL,Oral mucosa  moist, Ear/Nose normal on gross exam Respiratory system: Bilateral equal air entry, normal vesicular breath sounds, no wheezes or crackles  Cardiovascular system: S1 & S2 heard, RRR. No JVD, murmurs, rubs, gallops or clicks. No pedal edema. Gastrointestinal system: Abdomen is nondistended, soft and nontender. No organomegaly or masses felt. Normal bowel sounds heard. Central nervous system: Alert and oriented. No focal neurological deficits. Extremities: No edema, no clubbing ,no cyanosis Skin: No  rashes, lesions or ulcers,no icterus ,no pallor   Data Reviewed: I have personally reviewed following labs and imaging studies  CBC: Recent Labs  Lab 03/19/20 0819 03/20/20 0240 03/21/20 0748 03/22/20 0647 03/23/20 0528  WBC 2.4* 2.6* 2.3* 2.4* 2.5*  NEUTROABS 1.2*  --  0.9* 1.2* 1.1*  HGB 13.3 10.4* 10.7* 10.8* 10.3*  HCT 37.2 30.5* 31.6* 31.4* 29.4*  MCV 99.2 104.1* 103.9* 105.4* 103.9*  PLT 206 143* 141* 144* 626*   Basic Metabolic Panel: Recent Labs  Lab 03/19/20 0910 03/19/20 0910 03/20/20 0237 03/20/20 0240 03/21/20 0748 03/22/20 0647 03/23/20 0528  NA 136  --  139  --  136 139 139  K 3.4*  --  3.7  --  3.3* 3.7 3.5  CL 105  --  110  --  105 108 107  CO2 19*  --  23  --  27 25 26   GLUCOSE 103*  --  71  --  88 84 86  BUN 14  --  7  --  6 <5* <5*  CREATININE 0.79   < > 0.63 0.58 0.73 0.64 0.64  CALCIUM 8.9  --  7.5*  --  8.1* 8.0* 8.6*  MG  --   --   --   --  2.0 2.0  --    < > = values in this interval not displayed.   GFR: Estimated Creatinine Clearance: 79.2 mL/min (by C-G formula based on SCr of 0.64 mg/dL). Liver Function Tests: Recent Labs  Lab 03/19/20 0910 03/20/20 0237 03/22/20 0647 03/23/20 0528  AST 24 14* 13* 15  ALT 23 18 15 16   ALKPHOS 46 34* 35* 35*  BILITOT 1.2 0.8 0.8 0.8  PROT 8.0 5.9* 5.6* 5.2*  ALBUMIN 4.4 3.2* 3.2* 3.0*   No results for input(s): LIPASE, AMYLASE in the last 168 hours. No results for input(s): AMMONIA in the last 168 hours. Coagulation Profile: No results for input(s): INR, PROTIME in the last 168 hours. Cardiac Enzymes: No results for input(s): CKTOTAL, CKMB, CKMBINDEX, TROPONINI in the last 168 hours. BNP (last 3 results) No results for input(s): PROBNP in the last 8760 hours. HbA1C: No results for input(s): HGBA1C in the last 72 hours. CBG: No results for input(s): GLUCAP in the last 168 hours. Lipid Profile: No results for input(s): CHOL, HDL, LDLCALC, TRIG, CHOLHDL, LDLDIRECT in the last 72  hours. Thyroid Function Tests: Recent Labs    03/21/20 0748  TSH 3.461   Anemia Panel: Recent Labs    03/21/20 0748 03/21/20 0801  VITAMINB12 134*  --   FERRITIN 48 45  TIBC 242* 236*  IRON 99 101  RETICCTPCT 1.1  --    Sepsis Labs: No results for input(s): PROCALCITON, LATICACIDVEN in the last 168 hours.  Recent Results (from the past 240 hour(s))  Respiratory Panel by RT PCR (Flu A&B, Covid) - Nasopharyngeal Swab     Status: None   Collection Time: 03/19/20  9:10 AM   Specimen: Nasopharyngeal Swab  Result Value Ref Range Status   SARS Coronavirus 2 by  RT PCR NEGATIVE NEGATIVE Final    Comment: (NOTE) SARS-CoV-2 target nucleic acids are NOT DETECTED.  The SARS-CoV-2 RNA is generally detectable in upper respiratoy specimens during the acute phase of infection. The lowest concentration of SARS-CoV-2 viral copies this assay can detect is 131 copies/mL. A negative result does not preclude SARS-Cov-2 infection and should not be used as the sole basis for treatment or other patient management decisions. A negative result may occur with  improper specimen collection/handling, submission of specimen other than nasopharyngeal swab, presence of viral mutation(s) within the areas targeted by this assay, and inadequate number of viral copies (<131 copies/mL). A negative result must be combined with clinical observations, patient history, and epidemiological information. The expected result is Negative.  Fact Sheet for Patients:  PinkCheek.be  Fact Sheet for Healthcare Providers:  GravelBags.it  This test is no t yet approved or cleared by the [place] FDA and  has been authorized for detection and/or diagnosis of SARS-CoV-2 by FDA under an Emergency Use Authorization (EUA). This EUA will remain  in effect (meaning this test can be used) for the duration of the COVID-19 declaration under Section 564(b)(1) of the  Act, 21 U.S.C. section 360bbb-3(b)(1), unless the authorization is terminated or revoked sooner.     Influenza A by PCR NEGATIVE NEGATIVE Final   Influenza B by PCR NEGATIVE NEGATIVE Final    Comment: (NOTE) The Xpert Xpress SARS-CoV-2/FLU/RSV assay is intended as an aid in  the diagnosis of influenza from Nasopharyngeal swab specimens and  should not be used as a sole basis for treatment. Nasal washings and  aspirates are unacceptable for Xpert Xpress SARS-CoV-2/FLU/RSV  testing.  Fact Sheet for Patients: PinkCheek.be  Fact Sheet for Healthcare Providers: GravelBags.it  This test is not yet approved or cleared by the [place] FDA and  has been authorized for detection and/or diagnosis of SARS-CoV-2 by  FDA under an Emergency Use Authorization (EUA). This EUA will remain  in effect (meaning this test can be used) for the duration of the  Covid-19 declaration under Section 564(b)(1) of the Act, 21  U.S.C. section 360bbb-3(b)(1), unless the authorization is  terminated or revoked. Performed at [facility], [place] [address], [place], [place] [address]          Radiology Studies: No results found.      Scheduled Meds: . dronabinol  5 mg Oral BID AC  . enoxaparin (LOVENOX) injection  40 mg Subcutaneous Q24H  . feeding supplement  237 mL Oral TID BM  . fluticasone  2 spray Each Nare Daily  . guaiFENesin  600 mg Oral BID  . linaclotide  145 mcg Oral QAC breakfast  . methadone  5 mg Oral QHS  . multivitamin with minerals  1 tablet Oral Daily  . oxyCODONE  10 mg Oral Daily   And  . oxyCODONE  20 mg Oral QPM  . pantoprazole  40 mg Oral QPM  . polyethylene glycol  17 g Oral BID  . senna-docusate  1 tablet Oral BID  . sodium chloride flush  3 mL Intravenous Q12H  . sucralfate  1 g Oral TID WC & HS  . vitamin B-12  1,000 mcg Oral Daily   Continuous Infusions: . dextrose 5% lactated  ringers 50 mL/hr at 03/23/20 0552     LOS: 2 days    Time spent:25 mins. More than 50% of that time was spent in counseling and/or coordination of care.      Summers Buendia  Tawanna Solo, MD [facility] P11/15/2021, 2:30 PM

## 2020-03-23 NOTE — Progress Notes (Signed)
Ms. Caitlin Greene is slowly getting better.  She had an upper endoscopy over the weekend.  Really, not much showed up there.  There is no obstruction.  There is no gastritis.  She had a hiatal hernia.  She says that she has a bowel movement then gets sick and throws up.  I am not sure how or why this would happen.  I know physical therapy is working with her.  I think this is going to be vitally important.  I still think there might be a emotional component to a lot of this.  I know there was a lot of anxiety and stress that she had on her.  Her CA 27.29 has been coming down.  This certainly is encouraging.  I did speak with Radiation Oncology.  They will see her about the possibility of stereotactic radiosurgery for the clival lesion.  We will try her on some Carafate.  We will see if this might help a little bit.  I am not sure where she was diagnosed with this QT prolongation.  I think that Reglan would help her out.  However, we cannot use this if there is issues with QT elongation.  I, unfortunate, cannot pull up her EKG on the system right now.  I really need to see if the QT prolongation is a real problem.  Her labs look pretty good today.  She still has little bit of leukopenia which I suspect is probably from the [place] that she was on.  I noted that her B12 is a little bit on the low side.  Hopefully she is getting some B12 supplementation.  On her physical exam, she has decreased but decent bowel sounds.  There is no guarding or rebound tenderness.  She has no fluid wave.  Lungs are clear.  Cardiac exam regular rate and rhythm.  I appreciate the great care that she is getting from all the staff on 6 E.  I really appreciate their courtesy in their compassion.  Lattie Haw, MD  1 Corinthians 15:58

## 2020-03-23 NOTE — Progress Notes (Signed)
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    PT Comments    Patient with no /o dizziness today with mobility. She was able to progress gait mobility to ~160' with HR stable ranging from 100's-110's. She is mildly unsteady and requires close guard during transfers and gait. Educated pt on exercises for LE strengthening and she fatigued after ~5 reps. She will continue to benefit from skilled PT interventions in acute setting, anticipate she will not need follow up after discharge.     Follow Up Recommendations  No PT follow up     Equipment Recommendations  None recommended by PT    Recommendations for Other Services       Precautions / Restrictions Precautions Precautions: Other (comment);Fall Precaution Comments: monitor HR Restrictions Weight Bearing Restrictions: No    Mobility  Bed Mobility Overal bed mobility: Needs Assistance Bed Mobility: Supine to Sit     Supine to sit: Supervision;HOB elevated     General bed mobility comments: pt requried slightly increased time, no assist required.   Transfers Overall transfer level: Needs assistance Equipment used: None Transfers: Sit to/from Stand Sit to Stand: Min guard         General transfer comment: min guard for safety, no assist needed for power up. HR stable in 90's-110 bpm with standing EOB.  Ambulation/Gait Ambulation/Gait assistance: Min guard Gait Distance (Feet): 160 Feet Assistive device: None Gait Pattern/deviations: Step-through pattern;Decreased step length -  right;Decreased step length - left;Decreased stride length Gait velocity: decr   General Gait Details: pt ambulated short distance to sink to complete self care and brush teeth at sink. pt mildly unsteady with gait in hallway and close min guard provided for safety. pt's HR ranged 100's-110's throughout.    Stairs             Wheelchair Mobility    Modified Rankin (Stroke Patients Only)       Balance Overall balance assessment: Needs assistance Sitting-balance support: Feet supported Sitting balance-Leahy Scale: Good     Standing balance support: During functional activity;No upper extremity supported Standing balance-Leahy Scale: Good                              Cognition Arousal/Alertness: Awake/alert Behavior During Therapy: WFL for tasks assessed/performed Overall Cognitive Status: Within Functional Limits for tasks assessed                                        Exercises Other Exercises Other Exercises: 5 reps Sit<>Stand from recliner, no use of UE's.    General Comments        Pertinent Vitals/Pain Pain Assessment: No/denies pain    Home Living                      Prior Function            PT Goals (current goals can now be found in the care plan section) Acute Rehab PT Goals  Patient Stated Goal: less pain PT Goal Formulation: With patient Time For Goal Achievement: 04/03/20 Potential to Achieve Goals: Good Progress towards PT goals: Progressing toward goals    Frequency    Min 3X/week      PT Plan Current plan remains appropriate    Co-evaluation              AM-PAC PT "6 Clicks" Mobility   Outcome Measure  Help needed turning from your back to your side while in a flat bed without using bedrails?: None Help needed moving from lying on your back to sitting on the side of a flat bed without using bedrails?: None Help needed moving to and from a bed to a chair (including a wheelchair)?:  A Little Help needed standing up from a chair using your arms (e.g., wheelchair or bedside chair)?: A Little Help needed to walk in hospital room?: A Little Help needed climbing 3-5 steps with a railing? : A Little 6 Click Score: 20    End of Session Equipment Utilized During Treatment: Gait belt Activity Tolerance: Patient tolerated treatment well Patient left: in chair;with call bell/phone within reach Nurse Communication: Mobility status PT Visit Diagnosis: Unsteadiness on feet (R26.81);Other abnormalities of gait and mobility (R26.89)     Time: 8088-1103 PT Time Calculation (min) (ACUTE ONLY): 17 min  Charges:  $Gait Training: 8-22 mins                     Verner Mould, DPT Acute Rehabilitation Services  Office [phone] Pager [phone]  03/23/2020 2:53 PM

## 2020-03-24 ENCOUNTER — Encounter: Payer: Self-pay | Admitting: Gastroenterology

## 2020-03-24 ENCOUNTER — Other Ambulatory Visit: Payer: Self-pay

## 2020-03-24 DIAGNOSIS — R112 Nausea with vomiting, unspecified: Secondary | ICD-10-CM | POA: Diagnosis not present

## 2020-03-24 LAB — SURGICAL PATHOLOGY

## 2020-03-24 MED ORDER — VITAMIN B-12 1000 MCG PO TABS: 1000.0000 ug | ORAL_TABLET | Freq: Every day | ORAL | 1 refills | Status: DC

## 2020-03-24 MED ORDER — POLYETHYLENE GLYCOL 3350 17 G PO PACK: 17.0000 g | PACK | Freq: Every day | ORAL | 0 refills | Status: DC | PRN

## 2020-03-24 MED ORDER — ONDANSETRON HCL 4 MG PO TABS
4.0000 mg | ORAL_TABLET | ORAL | 0 refills | Status: DC | PRN
Start: 2020-03-24 — End: 2022-04-25

## 2020-03-24 MED ORDER — SULFAMETHOXAZOLE-TRIMETHOPRIM 800-160 MG PO TABS: 1.0000 | ORAL_TABLET | Freq: Two times a day (BID) | ORAL | 0 refills | Status: AC

## 2020-03-24 MED ORDER — SULFAMETHOXAZOLE-TRIMETHOPRIM 800-160 MG PO TABS
1.0000 | ORAL_TABLET | Freq: Two times a day (BID) | ORAL | Status: DC
  Administered 2020-03-24: 1 via ORAL
  Filled 2020-03-24: qty 1

## 2020-03-24 NOTE — Plan of Care (Signed)
Patient has been provided discharge instructions. All aspects of care plan met adequate for discharge.

## 2020-03-24 NOTE — Progress Notes (Signed)
A. GASTRIC ANTRUM BODY AND FUNDUS, BIOPSY:  - Antral and oxyntic mucosa with hyperemia and slight chronic  inflammation.  - Warthin-Starry negative for Helicobacter pylori.  - No intestinal metaplasia, dysplasia or carcinoma.   B. GASTRIC POLYPS, BIOPSY:  - Fundic gland polyps.  - No intestinal metaplasia, dysplasia or carcinoma.   C. ESOPHAGUS, DISTAL, BIOPSY:  - Unremarkable squamous mucosa.  - No eosinophilic esophagitis (less than 5 per high-power field).   D. ESOPHAGUS, MID AND PROXIMAL, BIOPSY:  - Unremarkable squamous mucosa.  - No eosinophilic esophagitis (less than 5 per high-power field).

## 2020-03-24 NOTE — Discharge Summary (Signed)
Physician Discharge Summary  Caitlin Greene CXK:[id number] DOB: 16-May-1965 DOA: 03/19/2020  PCP: Biagio Borg, MD  Admit date: 03/19/2020 Discharge date: 03/24/2020  Admitted From: Home Disposition:  Home  Discharge Condition:Stable CODE STATUS:DNR Diet recommendation: Regular    Brief/Interim Summary:  Patient is a 54 year old female with history of ductal carcinoma in situ diagnosed 30 years ago status post right mastectomy, recently found to have metastasis to bone and possibly lung or anastrozole/Ibrance following at [facility], GERD, chronic vertigo, anxiety/depression, hyperlipidemia who presented to the emergency department with concerns of generalized weakness, nausea, vomiting, shortness of breath.  Patient admitted for the management of intractable nausea and vomiting and inability to tolerate oral intake. She looks much better today with improved nausea, has not vomited today.  She tolerating advanced diet.  She is medically stable for discharge to home today.  Following problems were addressed during her hospitalization:  Intractable nausea/vomiting: Presented with dehydration, weakness, inability to tolerate anything oral 55 pounds weight loss since June this year.  GI consulted on admission.  EGD done which showed medium-sized hiatal hernia, normal esophagus/stomach.    Currently she is tolerating solid diet.  Nutrition was following during this hospitalization..  Metastatic breast cancer: Follows with oncology.  Takes anastrozole and Ibrance.  Recently found to have metastasis to bone and possibly on the lung.  She will follow-up with radiation oncology as an outpatient.  Constipation: Continue aggressive bowel regimen  Hypokalemia: Supplemented  Neutropenia: Possibly cancer related.  Continue to monitor as an outpatient.  Started on a short course of Bactrim.  Macrocytic anemia: Low vitamin B12-we started on oral supplementation.  Prolonged QTc: Improved with  new QTC of 450s  History of depression: takes fluoxitine  at home.    Discharge Diagnoses:  Principal Problem:   Nausea & vomiting Active Problems:   Generalized weakness   Metastatic breast cancer ([place])   Cancer related pain   Prolonged QT interval   Dysphagia   N&V (nausea and vomiting)   Constipation    Discharge Instructions  Discharge Instructions    Diet general   Complete by: As directed    Discharge instructions   Complete by: As directed    1)Please follow-up with your PCP in a week.  Please do a CBC just during the follow-up. 2)Please follow up with your oncologist in next 1-2 weeks 3)Take prescribed medications as instructed   Increase activity slowly   Complete by: As directed      Allergies as of 03/24/2020      Reactions   Amoxicillin-pot Clavulanate Hives   Codeine Nausea And Vomiting   Needs pre-meds Needs pre-meds      Medication List    STOP taking these medications   cyclobenzaprine 10 MG tablet Commonly known as: FLEXERIL     TAKE these medications   acetaminophen 650 MG CR tablet Commonly known as: TYLENOL Take 650 mg by mouth every 8 (eight) hours as needed for pain.   anastrozole 1 MG tablet Commonly known as: ARIMIDEX Take 1 mg by mouth daily.   CALCIUM PO Take 1 tablet by mouth daily.   FLUoxetine 40 MG capsule Commonly known as: PROZAC Take 40 mg by mouth daily. What changed: Another medication with the same name was removed. Continue taking this medication, and follow the directions you see here.   hydrOXYzine 25 MG tablet Commonly known as: ATARAX/VISTARIL Take 25 mg by mouth 3 (three) times daily as needed for anxiety.   Ibrance 100 MG tablet  Generic drug: palbociclib Take 100 mg by mouth See admin instructions. 100mg  daily on day 1-21 of cycle. 7 days off.  Repeat.   methadone 5 MG tablet Commonly known as: DOLOPHINE Take 5 mg by mouth at bedtime.   omeprazole 20 MG capsule Commonly known as: PRILOSEC Take  20 mg by mouth daily.   ondansetron 4 MG tablet Commonly known as: ZOFRAN Take 1 tablet (4 mg total) by mouth every 4 (four) hours as needed. What changed: reasons to take this   oxyCODONE 20 mg 12 hr tablet Commonly known as: OXYCONTIN Take 10-20 mg by mouth See admin instructions. 10mg  in the morning 20mg  at night   oxyCODONE 5 MG immediate release tablet Commonly known as: Oxy IR/ROXICODONE Take 5 mg by mouth every 4 (four) hours as needed for pain.   polyethylene glycol 17 g packet Commonly known as: MIRALAX / GLYCOLAX Take 17 g by mouth daily as needed.   SM Senna Laxative 8.6 MG tablet Generic drug: senna Take 8.6 mg by mouth daily.   sulfamethoxazole-trimethoprim 800-160 MG tablet Commonly known as: BACTRIM DS Take 1 tablet by mouth every 12 (twelve) hours for 5 days.   vitamin B-12 1000 MCG tablet Commonly known as: CYANOCOBALAMIN Take 1 tablet (1,000 mcg total) by mouth daily.       Follow-up Information    Biagio Borg, MD. Schedule an appointment as soon as possible for a visit in 1 week(s).   Specialties: Internal Medicine, Radiology Contact information: [place] [address] [phone]              Allergies  Allergen Reactions  . Amoxicillin-Pot Clavulanate Hives  . Codeine Nausea And Vomiting    Needs pre-meds Needs pre-meds    Consultations: Oncology  Procedures/Studies: DG Chest 2 View  Result Date: 03/19/2020 CLINICAL DATA:  Shortness of breath. EXAM: CHEST - 2 VIEW COMPARISON:  February 25, 2020. FINDINGS: The heart size and mediastinal contours are within normal limits. No pneumothorax or pleural effusion is noted. Mild bibasilar subsegmental atelectasis or infiltrates are noted. Small sliding-type hiatal hernia is noted. Status post kyphoplasty at 2 levels within the thoracic spine. IMPRESSION: Mild bibasilar subsegmental atelectasis or infiltrates are noted. Electronically Signed   By: Marijo Conception M.D.   On:  03/19/2020 09:14   CT Head W or Wo Contrast  Result Date: 03/19/2020 CLINICAL DATA:  History of skull base metastatic disease. Generalized weakness over the last 2 weeks. Metastatic breast cancer. EXAM: CT HEAD WITHOUT AND WITH CONTRAST TECHNIQUE: Contiguous axial images were obtained from the base of the skull through the vertex without and with intravenous contrast CONTRAST:  143mL OMNIPAQUE IOHEXOL 350 MG/ML SOLN COMPARISON:  Cervical MRI 10/24/2019. Bone scan 02/17/2020. Head CT 12/02/2019 FINDINGS: Brain: The brain has normal appearance without evidence of atrophy, old or recent infarction, mass lesion, hemorrhage, hydrocephalus or extra-axial collection. No abnormal contrast enhancement occurs. Vascular: No abnormal vascular finding. Skull: 13 x 9 x 9 mm sclerotic focus within the right side of the clivus without evidence lytic bone destruction or extraosseous tumor. This could be related to bony metastatic disease or could be a focus of fibrous dysplasia. No second regional lesion is identified. Sinuses/Orbits: Clear sinuses as seen. Limited orbital visualization is negative. Other: None IMPRESSION: 1. 13 x 9 x 9 mm sclerotic focus within the right side of the clivus without evidence of lytic bone destruction or extraosseous tumor. This could be related to bony metastatic disease or  could be a focus of fibrous dysplasia. No second regional lesion is identified. 2. Normal appearance of the brain itself. Electronically Signed   By: Nelson Chimes M.D.   On: 03/19/2020 11:40   CT Angio Chest PE W and/or Wo Contrast  Result Date: 03/19/2020 CLINICAL DATA:  High probability for pulmonary embolism. Shortness of breath EXAM: CT ANGIOGRAPHY CHEST WITH CONTRAST TECHNIQUE: Multidetector CT imaging of the chest was performed using the standard protocol during bolus administration of intravenous contrast. Multiplanar CT image reconstructions and MIPs were obtained to evaluate the vascular anatomy. CONTRAST:   140mL OMNIPAQUE IOHEXOL 350 MG/ML SOLN COMPARISON:  06/04/2014 FINDINGS: Cardiovascular: Normal heart size. No pericardial effusion. No pulmonary artery filling defects. Normal aorta Mediastinum/Nodes: Large sliding hiatal hernia. Lungs/Pleura: Dependent streaky opacity consistent with atelectasis. There is no edema, consolidation, effusion, or pneumothorax. Upper Abdomen: No acute finding Musculoskeletal: No acute finding. T6 and T10 compression fracture with advanced height loss at T10. Both of these levels have undergone cement augmentation. Multifocal vertebral metastatic disease with similar extent when compared to a thoracic MRI October 24, 2019. No evidence of acute fracture. Other: Intra and extracapsular breast implant rupture on the right, chronic when compared to 2016. Review of the MIP images confirms the above findings. IMPRESSION: 1. Negative for pulmonary embolism. 2. Low volume chest with atelectasis at the bases. 3. Known osseous metastatic disease.  No acute fracture. 4. Large sliding hiatal hernia. Electronically Signed   By: Monte Fantasia M.D.   On: 03/19/2020 11:45   MR BRAIN W WO CONTRAST  Result Date: 03/20/2020 CLINICAL DATA:  Metastatic breast cancer. Evaluate clivus involvement. EXAM: MRI HEAD WITHOUT AND WITH CONTRAST TECHNIQUE: Multiplanar, multiecho pulse sequences of the brain and surrounding structures were obtained without and with intravenous contrast. CONTRAST:  39mL GADAVIST GADOBUTROL 1 MMOL/ML IV SOLN COMPARISON:  Head CT yesterday. Cervical MRI 10/24/2019. Bone scan 02/17/2020. Head CT 12/02/2019. FINDINGS: Brain: Diffusion imaging does not show any acute or subacute infarction. The brainstem and cerebellum are normal. Cerebral hemispheres are normal. No evidence of metastatic disease to the brain or leptomeninges. There is some artifact related to previous temporomandibular joint region surgery on the left. No hemorrhage, hydrocephalus or extra-axial collection. No abnormal  contrast enhancement. Vascular: Major vessels at the base of the brain show flow. Skull and upper cervical spine: As shown previously, there is a 13 mm region of diminished T1 and T2 signal in the right side of the clivus. This could be a metastasis or fibrous dysplasia. No lytic destructive component. No extraosseous component. No second bone lesion identified. Sinuses/Orbits: Clear/normal Other: None IMPRESSION: 1. Normal appearance of the brain itself. No evidence of metastatic disease to the brain or leptomeninges. 2. 13 mm region of diminished T1 and T2 signal in the right side of the clivus. This could be a metastasis or fibrous dysplasia. No extraosseous component. No second bone lesion identified. Electronically Signed   By: Nelson Chimes M.D.   On: 03/20/2020 11:12   DG ESOPHAGUS W SINGLE CM (SOL OR THIN BA)  Result Date: 03/20/2020 CLINICAL DATA:  54 year old female with history of globus sensation while ingesting solid food for the past 2-3 weeks. History of gastroesophageal reflux disease. EXAM: ESOPHOGRAM / BARIUM SWALLOW / BARIUM TABLET STUDY TECHNIQUE: Combined double contrast and single contrast examination performed using effervescent crystals, thick barium liquid, and thin barium liquid. The patient was observed with fluoroscopy swallowing a 13 mm barium sulphate tablet. FLUOROSCOPY TIME:  Fluoroscopy Time:  1 minutes  and 36 seconds Radiation Exposure Index (if provided by the fluoroscopic device): 12.5 mGy COMPARISON:  None. FINDINGS: Double contrast images of the esophagus demonstrate a normal appearance of the esophageal mucosa. Multiple single swallow attempts were observed which demonstrated normal esophageal motility. Full column esophagram demonstrated no esophageal mass, stricture or esophageal ring. Large hiatal hernia. A barium tablet was administered, which passed readily into the stomach. IMPRESSION: 1. Large hiatal hernia. 2. Normal esophageal motility. Electronically Signed   By:  [name] M.D.   On: 03/20/2020 15:46      Subjective: Patient seen and examined at the bedside this morning.  Hemodynamically stable for discharge today.  Discharge Exam: Vitals:   03/23/20 2126 03/24/20 0606  BP: 105/77 122/76  Pulse: (!) 108 67  Resp: 20 18  Temp: 97.6 F (36.4 C) 97.6 F (36.4 C)  SpO2: 93% 94%   Vitals:   03/23/20 0546 03/23/20 1419 03/23/20 2126 03/24/20 0606  BP: 101/66 116/78 105/77 122/76  Pulse: 70 72 (!) 108 67  Resp: 14 18 20 18   Temp: 97.8 F (36.6 C) 98 F (36.7 C) 97.6 F (36.4 C) 97.6 F (36.4 C)  TempSrc: Oral Oral Oral Oral  SpO2: 95% 100% 93% 94%  Weight:      Height:        General: Pt is alert, awake, not in acute distress Cardiovascular: RRR, S1/S2 +, no rubs, no gallops Respiratory: CTA bilaterally, no wheezing, no rhonchi Abdominal: Soft, NT, ND, bowel sounds + Extremities: no edema, no cyanosis    The results of significant diagnostics from this hospitalization (including imaging, microbiology, ancillary and laboratory) are listed below for reference.     Microbiology: Recent Results (from the past 240 hour(s))  Respiratory Panel by RT PCR (Flu A&B, Covid) - Nasopharyngeal Swab     Status: None   Collection Time: 03/19/20  9:10 AM   Specimen: Nasopharyngeal Swab  Result Value Ref Range Status   SARS Coronavirus 2 by RT PCR NEGATIVE NEGATIVE Final    Comment: (NOTE) SARS-CoV-2 target nucleic acids are NOT DETECTED.  The SARS-CoV-2 RNA is generally detectable in upper respiratoy specimens during the acute phase of infection. The lowest concentration of SARS-CoV-2 viral copies this assay can detect is 131 copies/mL. A negative result does not preclude SARS-Cov-2 infection and should not be used as the sole basis for treatment or other patient management decisions. A negative result may occur with  improper specimen collection/handling, submission of specimen other than nasopharyngeal swab, presence of viral  mutation(s) within the areas targeted by this assay, and inadequate number of viral copies (<131 copies/mL). A negative result must be combined with clinical observations, patient history, and epidemiological information. The expected result is Negative.  Fact Sheet for Patients:  PinkCheek.be  Fact Sheet for Healthcare Providers:  GravelBags.it  This test is no t yet approved or cleared by the [place] FDA and  has been authorized for detection and/or diagnosis of SARS-CoV-2 by FDA under an Emergency Use Authorization (EUA). This EUA will remain  in effect (meaning this test can be used) for the duration of the COVID-19 declaration under Section 564(b)(1) of the Act, 21 U.S.C. section 360bbb-3(b)(1), unless the authorization is terminated or revoked sooner.     Influenza A by PCR NEGATIVE NEGATIVE Final   Influenza B by PCR NEGATIVE NEGATIVE Final    Comment: (NOTE) The Xpert Xpress SARS-CoV-2/FLU/RSV assay is intended as an aid in  the diagnosis of influenza from Nasopharyngeal swab specimens  and  should not be used as a sole basis for treatment. Nasal washings and  aspirates are unacceptable for Xpert Xpress SARS-CoV-2/FLU/RSV  testing.  Fact Sheet for Patients: PinkCheek.be  Fact Sheet for Healthcare Providers: GravelBags.it  This test is not yet approved or cleared by the [place] FDA and  has been authorized for detection and/or diagnosis of SARS-CoV-2 by  FDA under an Emergency Use Authorization (EUA). This EUA will remain  in effect (meaning this test can be used) for the duration of the  Covid-19 declaration under Section 564(b)(1) of the Act, 21  U.S.C. section 360bbb-3(b)(1), unless the authorization is  terminated or revoked. Performed at [facility], [place] [address], [place], [place] [address]      Labs: BNP (last  3 results) Recent Labs    03/19/20 0820  BNP 64.4   Basic Metabolic Panel: Recent Labs  Lab 03/19/20 0910 03/19/20 0910 03/20/20 0237 03/20/20 0240 03/21/20 0748 03/22/20 0647 03/23/20 0528  NA 136  --  139  --  136 139 139  K 3.4*  --  3.7  --  3.3* 3.7 3.5  CL 105  --  110  --  105 108 107  CO2 19*  --  23  --  27 25 26   GLUCOSE 103*  --  71  --  88 84 86  BUN 14  --  7  --  6 <5* <5*  CREATININE 0.79   < > 0.63 0.58 0.73 0.64 0.64  CALCIUM 8.9  --  7.5*  --  8.1* 8.0* 8.6*  MG  --   --   --   --  2.0 2.0  --    < > = values in this interval not displayed.   Liver Function Tests: Recent Labs  Lab 03/19/20 0910 03/20/20 0237 03/22/20 0647 03/23/20 0528  AST 24 14* 13* 15  ALT 23 18 15 16   ALKPHOS 46 34* 35* 35*  BILITOT 1.2 0.8 0.8 0.8  PROT 8.0 5.9* 5.6* 5.2*  ALBUMIN 4.4 3.2* 3.2* 3.0*   No results for input(s): LIPASE, AMYLASE in the last 168 hours. No results for input(s): AMMONIA in the last 168 hours. CBC: Recent Labs  Lab 03/19/20 0819 03/20/20 0240 03/21/20 0748 03/22/20 0647 03/23/20 0528  WBC 2.4* 2.6* 2.3* 2.4* 2.5*  NEUTROABS 1.2*  --  0.9* 1.2* 1.1*  HGB 13.3 10.4* 10.7* 10.8* 10.3*  HCT 37.2 30.5* 31.6* 31.4* 29.4*  MCV 99.2 104.1* 103.9* 105.4* 103.9*  PLT 206 143* 141* 144* 128*   Cardiac Enzymes: No results for input(s): CKTOTAL, CKMB, CKMBINDEX, TROPONINI in the last 168 hours. BNP: Invalid input(s): POCBNP CBG: No results for input(s): GLUCAP in the last 168 hours. D-Dimer No results for input(s): DDIMER in the last 72 hours. Hgb A1c No results for input(s): HGBA1C in the last 72 hours. Lipid Profile No results for input(s): CHOL, HDL, LDLCALC, TRIG, CHOLHDL, LDLDIRECT in the last 72 hours. Thyroid function studies No results for input(s): TSH, T4TOTAL, T3FREE, THYROIDAB in the last 72 hours.  Invalid input(s): FREET3 Anemia work up No results for input(s): VITAMINB12, FOLATE, FERRITIN, TIBC, IRON, RETICCTPCT in the last  72 hours. Urinalysis    Component Value Date/Time   COLORURINE YELLOW 04/12/2018 [place] 04/12/2018 1413   LABSPEC 1.025 04/12/2018 1413   PHURINE 6.5 04/12/2018 1413   GLUCOSEU NEGATIVE 04/12/2018 1413   HGBUR NEGATIVE 04/12/2018 1413   BILIRUBINUR negative 07/06/2019 [place]  1 12/20/2014 1007   KETONESUR negative 07/06/2019 [place] 04/12/2018 1413   PROTEINUR negative 07/06/2019 1507   PROTEINUR neg 12/20/2014 1007   UROBILINOGEN 1.0 07/06/2019 1507   UROBILINOGEN 0.2 04/12/2018 1413   NITRITE Negative 07/06/2019 1507   NITRITE NEGATIVE 04/12/2018 1413   LEUKOCYTESUR Negative 07/06/2019 1507   Sepsis Labs Invalid input(s): PROCALCITONIN,  WBC,  LACTICIDVEN Microbiology Recent Results (from the past 240 hour(s))  Respiratory Panel by RT PCR (Flu A&B, Covid) - Nasopharyngeal Swab     Status: None   Collection Time: 03/19/20  9:10 AM   Specimen: Nasopharyngeal Swab  Result Value Ref Range Status   SARS Coronavirus 2 by RT PCR NEGATIVE NEGATIVE Final    Comment: (NOTE) SARS-CoV-2 target nucleic acids are NOT DETECTED.  The SARS-CoV-2 RNA is generally detectable in upper respiratoy specimens during the acute phase of infection. The lowest concentration of SARS-CoV-2 viral copies this assay can detect is 131 copies/mL. A negative result does not preclude SARS-Cov-2 infection and should not be used as the sole basis for treatment or other patient management decisions. A negative result may occur with  improper specimen collection/handling, submission of specimen other than nasopharyngeal swab, presence of viral mutation(s) within the areas targeted by this assay, and inadequate number of viral copies (<131 copies/mL). A negative result must be combined with clinical observations, patient history, and epidemiological information. The expected result is Negative.  Fact Sheet for Patients:   PinkCheek.be  Fact Sheet for Healthcare Providers:  GravelBags.it  This test is no t yet approved or cleared by the [place] FDA and  has been authorized for detection and/or diagnosis of SARS-CoV-2 by FDA under an Emergency Use Authorization (EUA). This EUA will remain  in effect (meaning this test can be used) for the duration of the COVID-19 declaration under Section 564(b)(1) of the Act, 21 U.S.C. section 360bbb-3(b)(1), unless the authorization is terminated or revoked sooner.     Influenza A by PCR NEGATIVE NEGATIVE Final   Influenza B by PCR NEGATIVE NEGATIVE Final    Comment: (NOTE) The Xpert Xpress SARS-CoV-2/FLU/RSV assay is intended as an aid in  the diagnosis of influenza from Nasopharyngeal swab specimens and  should not be used as a sole basis for treatment. Nasal washings and  aspirates are unacceptable for Xpert Xpress SARS-CoV-2/FLU/RSV  testing.  Fact Sheet for Patients: PinkCheek.be  Fact Sheet for Healthcare Providers: GravelBags.it  This test is not yet approved or cleared by the [place] FDA and  has been authorized for detection and/or diagnosis of SARS-CoV-2 by  FDA under an Emergency Use Authorization (EUA). This EUA will remain  in effect (meaning this test can be used) for the duration of the  Covid-19 declaration under Section 564(b)(1) of the Act, 21  U.S.C. section 360bbb-3(b)(1), unless the authorization is  terminated or revoked. Performed at [facility], [place] [address], [place], [place] [address]     Please note: You were cared for by a hospitalist during your hospital stay. Once you are discharged, your primary care physician will handle any further medical issues. Please note that NO REFILLS for any discharge medications will be authorized once you are discharged, as it is imperative that you  return to your primary care physician (or establish a relationship with a primary care physician if you do not have one) for your post hospital discharge needs so that they can reassess your need for medications and monitor your lab values.  Time coordinating discharge: 40 minutes  SIGNED:   Shelly Coss, MD  [facility] 03/24/2020, 1:06 PM Pager [phone]  If 7PM-7AM, please contact night-coverage [email or web address] Password [email or web address]

## 2020-03-24 NOTE — Progress Notes (Signed)
Overall, Ms. [place] is doing better.  She did some physical therapy yesterday.  She took a shower.  She has had no problems with vomiting.  She is going to the bathroom okay.  Her complaint has been sinus congestion.  She has some green drainage.  She has had this before.  We will put her on some Bactrim to see if this can help.  I did speak with Dr. Sondra Come of Radiation Oncology yesterday.  He will or one of his colleagues will see her about radiosurgery for this clival lesion.  She does not have any problems with pain.  There is been no issues with bleeding.  She has had no rashes.  Hopefully, she will be able to go home today.  She does not have any lab work done yet today.  All of her vital signs are stable.  Her blood pressure is 122/76.  Her temperature is 97.6.  Her lungs are clear bilaterally.  Her sinuses do show some tenderness to palpation and percussion.  Her cardiac exam regular rate and rhythm.  Abdomen is soft.  Bowel sounds are better.  There is no distention.  Extremities shows no clubbing, cyanosis or edema.  I am still not sure what this QT prolongation is.  We will get an EKG on her.  I do not think that it would be a problem to have her on Bactrim.  Again, hopefully she will be able to go home today.  If she does, we will plan to get her back next week for follow-up.  Lattie Haw, MD  Romans 1:16

## 2020-03-25 ENCOUNTER — Telehealth: Payer: Self-pay

## 2020-03-25 NOTE — Telephone Encounter (Signed)
Called pt per inbasket message and she is aware of her appt ... AOM

## 2020-03-27 ENCOUNTER — Other Ambulatory Visit: Payer: Self-pay

## 2020-03-27 ENCOUNTER — Encounter: Payer: Self-pay | Admitting: Internal Medicine

## 2020-03-27 ENCOUNTER — Ambulatory Visit: Payer: BC Managed Care – PPO | Admitting: Internal Medicine

## 2020-03-27 VITALS — BP 110/80 | HR 91 | Temp 98.1°F | Ht 68.0 in | Wt 159.0 lb

## 2020-03-27 DIAGNOSIS — Z7409 Other reduced mobility: Secondary | ICD-10-CM | POA: Diagnosis not present

## 2020-03-27 DIAGNOSIS — Z9889 Other specified postprocedural states: Secondary | ICD-10-CM | POA: Diagnosis not present

## 2020-03-27 DIAGNOSIS — N920 Excessive and frequent menstruation with regular cycle: Secondary | ICD-10-CM | POA: Insufficient documentation

## 2020-03-27 DIAGNOSIS — Z0001 Encounter for general adult medical examination with abnormal findings: Secondary | ICD-10-CM

## 2020-03-27 DIAGNOSIS — S32000D Wedge compression fracture of unspecified lumbar vertebra, subsequent encounter for fracture with routine healing: Secondary | ICD-10-CM

## 2020-03-27 DIAGNOSIS — Z789 Other specified health status: Secondary | ICD-10-CM

## 2020-03-27 DIAGNOSIS — S32000A Wedge compression fracture of unspecified lumbar vertebra, initial encounter for closed fracture: Secondary | ICD-10-CM | POA: Insufficient documentation

## 2020-03-27 DIAGNOSIS — Z0000 Encounter for general adult medical examination without abnormal findings: Secondary | ICD-10-CM

## 2020-03-27 DIAGNOSIS — F32A Depression, unspecified: Secondary | ICD-10-CM | POA: Diagnosis not present

## 2020-03-27 DIAGNOSIS — N926 Irregular menstruation, unspecified: Secondary | ICD-10-CM | POA: Insufficient documentation

## 2020-03-27 DIAGNOSIS — Z23 Encounter for immunization: Secondary | ICD-10-CM | POA: Diagnosis not present

## 2020-03-27 DIAGNOSIS — C7951 Secondary malignant neoplasm of bone: Secondary | ICD-10-CM

## 2020-03-27 MED ORDER — OXYCODONE HCL ER 15 MG PO T12A: 15.0000 mg | EXTENDED_RELEASE_TABLET | Freq: Two times a day (BID) | ORAL | 0 refills | Status: DC

## 2020-03-27 MED ORDER — FLUOXETINE HCL 20 MG PO CAPS: 60.0000 mg | ORAL_CAPSULE | Freq: Every day | ORAL | 11 refills | Status: DC

## 2020-03-27 NOTE — Progress Notes (Signed)
Subjective:    Patient ID: Caitlin Greene, female    DOB: 1965-12-20, 54 y.o.   MRN: [id number]  HPI  Here for wellness and f/u;  Overall doing ok;  Pt denies Chest pain, worsening SOB, DOE, wheezing, orthopnea, PND, worsening LE edema, palpitations, dizziness or syncope.  Pt denies neurological change such as new headache, facial or extremity weakness.  Pt denies polydipsia, polyuria, or low sugar symptoms. Pt states overall good compliance with treatment and medications.  Pt has had mild worsening depressive symptoms, but no suicidal ideation or panic, and declines need for tx today. No fever, night sweats though has some loss of appetite and wt loss related to recent malignancy diagnosis.  Pt states good ability with ADL's, has currrent low fall risk, home safety reviewed and adequate, no other significant changes in hearing or vision,.Has appt mon with Dr Marin Olp soon for further management new dx breast ca with metastasis to bone, switching from [facility] due to perceived lack of empathy with oncology there.. Also needs new NS referral to f/u kyphoplasty x 3 lumbar malignancy related fractures. Due for flu shot Past Medical History:  Diagnosis Date  . ALLERGIC RHINITIS   . ANEMIA-IRON DEFICIENCY   . ANXIETY   . BREAST CANCER, HX OF 01/21/2007   at 54yo  . DEPRESSION   . GERD   . HYPERLIPIDEMIA   . RENAL CALCULUS    Past Surgical History:  Procedure Laterality Date  . BIOPSY  03/21/2020   Procedure: BIOPSY;  Surgeon: Thornton Park, MD;  Location: [facility];  Service: Gastroenterology;;  . BREAST ENHANCEMENT SURGERY    . ESOPHAGOGASTRODUODENOSCOPY (EGD) WITH PROPOFOL N/A 03/21/2020   Procedure: ESOPHAGOGASTRODUODENOSCOPY (EGD) WITH PROPOFOL;  Surgeon: Thornton Park, MD;  Location: [facility];  Service: Gastroenterology;  Laterality: N/A;  . MASTECTOMY     right  . TEMPOROMANDIBULAR JOINT SURGERY     Left  . TONSILLECTOMY      reports that she has never smoked. She has never  used smokeless tobacco. She reports that she does not drink alcohol and does not use drugs. family history includes Asthma in her sister; COPD in her father; Cancer in her mother; Colon polyps in her father and mother; Dementia in her mother; Diabetes in an other family member; Hypertension in her mother and another family member. Allergies  Allergen Reactions  . Amoxicillin-Pot Clavulanate Hives  . Codeine Nausea And Vomiting    Needs pre-meds Needs pre-meds   Current Outpatient Medications on File Prior to Visit  Medication Sig Dispense Refill  . acetaminophen (TYLENOL) 650 MG CR tablet Take 650 mg by mouth every 8 (eight) hours as needed for pain.    Marland Kitchen anastrozole (ARIMIDEX) 1 MG tablet Take 1 mg by mouth daily.    Marland Kitchen CALCIUM PO Take 1 tablet by mouth daily.    . hydrOXYzine (ATARAX/VISTARIL) 25 MG tablet Take 25 mg by mouth 3 (three) times daily as needed for anxiety.    Leslee Home 100 MG tablet Take 100 mg by mouth See admin instructions. 100mg  daily on day 1-21 of cycle. 7 days off.  Repeat.    . methadone (DOLOPHINE) 5 MG tablet Take 5 mg by mouth at bedtime.    Marland Kitchen omeprazole (PRILOSEC) 20 MG capsule Take 20 mg by mouth daily.    . ondansetron (ZOFRAN) 4 MG tablet Take 1 tablet (4 mg total) by mouth every 4 (four) hours as needed. 20 tablet 0  . oxyCODONE (OXY IR/ROXICODONE) 5 MG immediate release  tablet Take 5 mg by mouth every 4 (four) hours as needed for pain.    . polyethylene glycol (MIRALAX / GLYCOLAX) 17 g packet Take 17 g by mouth daily as needed. 28 each 0  . SM SENNA LAXATIVE 8.6 MG tablet Take 8.6 mg by mouth daily.    Marland Kitchen sulfamethoxazole-trimethoprim (BACTRIM DS) 800-160 MG tablet Take 1 tablet by mouth every 12 (twelve) hours for 5 days. 10 tablet 0  . vitamin B-12 (CYANOCOBALAMIN) 1000 MCG tablet Take 1 tablet (1,000 mcg total) by mouth daily. 30 tablet 1   No current facility-administered medications on file prior to visit.   Review of Systems All otherwise neg per pt     Objective:   Physical Exam BP 110/80 (BP Location: Left Arm, Patient Position: Sitting, Cuff Size: Large)   Pulse 91   Temp 98.1 F (36.7 C) (Oral)   Ht 5\' 8"  (1.727 m)   Wt 159 lb (72.1 kg)   LMP 06/09/2017 (Exact Date)   SpO2 98%   BMI 24.18 kg/m  VS noted,  Constitutional: Pt appears in NAD HENT: Head: NCAT.  Right Ear: External ear normal.  Left Ear: External ear normal.  Eyes: . Pupils are equal, round, and reactive to light. Conjunctivae and EOM are normal Nose: without d/c or deformity Neck: Neck supple. Gross normal ROM Cardiovascular: Normal rate and regular rhythm.   Pulmonary/Chest: Effort normal and breath sounds without rales or wheezing.  Abd:  Soft, NT, ND, + BS, no organomegaly Neurological: Pt is alert. At baseline orientation, motor grossly intact Skin: Skin is warm. No rashes, other new lesions, no LE edema Psychiatric: Pt behavior is normal without agitation , but somewhat tearful at times All otherwise neg per pt Lab Results  Component Value Date   WBC 2.5 (L) 03/23/2020   HGB 10.3 (L) 03/23/2020   HCT 29.4 (L) 03/23/2020   PLT 128 (L) 03/23/2020   GLUCOSE 86 03/23/2020   CHOL 212 (H) 04/12/2018   TRIG 229.0 (H) 04/12/2018   HDL 52.00 04/12/2018   LDLDIRECT 141.0 04/12/2018   LDLCALC 129 (H) 04/12/2013   ALT 16 03/23/2020   AST 15 03/23/2020   NA 139 03/23/2020   K 3.5 03/23/2020   CL 107 03/23/2020   CREATININE 0.64 03/23/2020   BUN <5 (L) 03/23/2020   CO2 26 03/23/2020   TSH 3.461 03/21/2020   HGBA1C 5.5 04/21/2014      Assessment & Plan:

## 2020-03-27 NOTE — Patient Instructions (Signed)
You had the flu shot today  Please continue all other medications as before, and refills have been done if requested.  Please have the pharmacy call with any other refills you may need.  Please continue your efforts at being more active, low cholesterol diet, and weight control.  You are otherwise up to date with prevention measures today.  You will be contacted regarding the referral for: Neurosurgury for follow up kyphoplasty  Please keep your appointments with your specialists as you may have planned - Dr Marin Olp on Monday  Please make an Appointment to return in 6 months, or as needed

## 2020-03-27 NOTE — Assessment & Plan Note (Signed)
X 3, s/p recent kyphoplasty, pt asks for referral for local f/u

## 2020-03-28 ENCOUNTER — Encounter: Payer: Self-pay | Admitting: Internal Medicine

## 2020-03-28 NOTE — Assessment & Plan Note (Signed)
For NS f/u

## 2020-03-28 NOTE — Assessment & Plan Note (Signed)
Improved s/p kyphoplasty, cont to follow

## 2020-03-28 NOTE — Assessment & Plan Note (Signed)

## 2020-03-28 NOTE — Assessment & Plan Note (Addendum)
Mild worsening situational, no SI or HI,  husband died with cancer 3 yrs ago, declines further tx or counseling at this time  I spent 31 minutes in addition to time for CPX wellness examination in preparing to see the patient by review of recent labs, imaging and procedures, obtaining and reviewing separately obtained history, communicating with the patient and family or caregiver, ordering medications, tests or procedures, and documenting clinical information in the EHR including the differential Dx, treatment, and any further evaluation and other management of depression, breast ca met to bone, lumber pathologic fx x 3 s/p kyphoplasty each, impaired ADLs,

## 2020-03-30 ENCOUNTER — Inpatient Hospital Stay: Payer: BC Managed Care – PPO | Attending: Radiation Oncology

## 2020-03-30 DIAGNOSIS — C7951 Secondary malignant neoplasm of bone: Secondary | ICD-10-CM | POA: Insufficient documentation

## 2020-03-30 DIAGNOSIS — C50919 Malignant neoplasm of unspecified site of unspecified female breast: Secondary | ICD-10-CM | POA: Insufficient documentation

## 2020-03-30 DIAGNOSIS — K449 Diaphragmatic hernia without obstruction or gangrene: Secondary | ICD-10-CM | POA: Insufficient documentation

## 2020-03-30 DIAGNOSIS — Z79811 Long term (current) use of aromatase inhibitors: Secondary | ICD-10-CM | POA: Insufficient documentation

## 2020-03-30 DIAGNOSIS — Z170 Estrogen receptor positive status [ER+]: Secondary | ICD-10-CM | POA: Insufficient documentation

## 2020-03-30 DIAGNOSIS — R531 Weakness: Secondary | ICD-10-CM | POA: Insufficient documentation

## 2020-03-30 DIAGNOSIS — R112 Nausea with vomiting, unspecified: Secondary | ICD-10-CM | POA: Insufficient documentation

## 2020-03-30 DIAGNOSIS — Z79899 Other long term (current) drug therapy: Secondary | ICD-10-CM | POA: Insufficient documentation

## 2020-04-07 ENCOUNTER — Inpatient Hospital Stay: Payer: BC Managed Care – PPO

## 2020-04-07 ENCOUNTER — Other Ambulatory Visit: Payer: Self-pay

## 2020-04-07 ENCOUNTER — Encounter: Payer: Self-pay | Admitting: Hematology & Oncology

## 2020-04-07 ENCOUNTER — Inpatient Hospital Stay (HOSPITAL_BASED_OUTPATIENT_CLINIC_OR_DEPARTMENT_OTHER): Payer: BC Managed Care – PPO | Admitting: Hematology & Oncology

## 2020-04-07 VITALS — BP 130/89 | HR 113 | Temp 98.3°F | Ht 68.5 in | Wt 156.0 lb

## 2020-04-07 DIAGNOSIS — S32000A Wedge compression fracture of unspecified lumbar vertebra, initial encounter for closed fracture: Secondary | ICD-10-CM | POA: Diagnosis not present

## 2020-04-07 DIAGNOSIS — K449 Diaphragmatic hernia without obstruction or gangrene: Secondary | ICD-10-CM | POA: Diagnosis not present

## 2020-04-07 DIAGNOSIS — R531 Weakness: Secondary | ICD-10-CM | POA: Diagnosis not present

## 2020-04-07 DIAGNOSIS — C50919 Malignant neoplasm of unspecified site of unspecified female breast: Secondary | ICD-10-CM

## 2020-04-07 DIAGNOSIS — Z170 Estrogen receptor positive status [ER+]: Secondary | ICD-10-CM | POA: Diagnosis not present

## 2020-04-07 DIAGNOSIS — C7951 Secondary malignant neoplasm of bone: Secondary | ICD-10-CM | POA: Diagnosis not present

## 2020-04-07 DIAGNOSIS — Z79899 Other long term (current) drug therapy: Secondary | ICD-10-CM | POA: Diagnosis not present

## 2020-04-07 DIAGNOSIS — R112 Nausea with vomiting, unspecified: Secondary | ICD-10-CM | POA: Diagnosis not present

## 2020-04-07 DIAGNOSIS — M545 Low back pain, unspecified: Secondary | ICD-10-CM | POA: Diagnosis not present

## 2020-04-07 DIAGNOSIS — Z79811 Long term (current) use of aromatase inhibitors: Secondary | ICD-10-CM | POA: Diagnosis not present

## 2020-04-07 LAB — CBC WITH DIFFERENTIAL (CANCER CENTER ONLY)
Band Neutrophils: 0 %
Basophils Absolute: 0.1 10*3/uL (ref 0.0–0.1)
Basophils Relative: 2 %
Eosinophils Absolute: 0.1 10*3/uL (ref 0.0–0.5)
Eosinophils Relative: 1 %
HCT: 40.2 % (ref 36.0–46.0)
Hemoglobin: 14.3 g/dL (ref 12.0–15.0)
Lymphocytes Relative: 47 %
Lymphs Abs: 1.5 10*3/uL (ref 0.7–4.0)
MCH: 37.3 pg — ABNORMAL HIGH (ref 26.0–34.0)
MCHC: 35.6 g/dL (ref 30.0–36.0)
MCV: 105 fL — ABNORMAL HIGH (ref 80.0–100.0)
Monocytes Absolute: 0.4 10*3/uL (ref 0.1–1.0)
Monocytes Relative: 13 %
Neutro Abs: 1.1 10*3/uL — ABNORMAL LOW (ref 1.7–7.7)
Neutrophils Relative %: 36 %
Platelet Count: 249 10*3/uL (ref 150–400)
RBC: 3.83 MIL/uL — ABNORMAL LOW (ref 3.87–5.11)
RDW: 17.2 % — ABNORMAL HIGH (ref 11.5–15.5)
WBC Count: 3.1 10*3/uL — ABNORMAL LOW (ref 4.0–10.5)
nRBC: 0 % (ref 0.0–0.2)

## 2020-04-07 LAB — CMP (CANCER CENTER ONLY)
ALT: 11 U/L (ref 0–44)
AST: 14 U/L — ABNORMAL LOW (ref 15–41)
Albumin: 4.6 g/dL (ref 3.5–5.0)
Alkaline Phosphatase: 50 U/L (ref 38–126)
Anion gap: 10 (ref 5–15)
BUN: 12 mg/dL (ref 6–20)
CO2: 26 mmol/L (ref 22–32)
Calcium: 9.7 mg/dL (ref 8.9–10.3)
Chloride: 103 mmol/L (ref 98–111)
Creatinine: 0.82 mg/dL (ref 0.44–1.00)
GFR, Estimated: >60 mL/min
Glucose, Bld: 98 mg/dL (ref 70–99)
Potassium: 3.3 mmol/L — ABNORMAL LOW (ref 3.5–5.1)
Sodium: 139 mmol/L (ref 135–145)
Total Bilirubin: 0.7 mg/dL (ref 0.3–1.2)
Total Protein: 7.6 g/dL (ref 6.5–8.1)

## 2020-04-07 LAB — LACTATE DEHYDROGENASE: LDH: 170 U/L (ref 98–192)

## 2020-04-07 MED ORDER — DRONABINOL 5 MG PO CAPS: 5.0000 mg | ORAL_CAPSULE | Freq: Two times a day (BID) | ORAL | 0 refills | Status: DC

## 2020-04-07 MED ORDER — SUCRALFATE 1 GM/10ML PO SUSP: 1.0000 g | Freq: Three times a day (TID) | ORAL | 6 refills | Status: DC

## 2020-04-07 MED ORDER — FAMCICLOVIR 250 MG PO TABS: 250.0000 mg | ORAL_TABLET | Freq: Every day | ORAL | 6 refills | Status: DC

## 2020-04-08 LAB — CANCER ANTIGEN 27.29: CA 27.29: 78.5 U/mL — ABNORMAL HIGH (ref 0.0–38.6)

## 2020-04-09 NOTE — Progress Notes (Signed)
Hematology and Oncology Follow Up Visit  Caitlin Greene [id number] 06-May-1966 54 y.o. 04/09/2020   Principle Diagnosis:   Metastatic breast cancer-ER positive/HER-2 negative --bone metastasis only  Current Therapy:    Faslodex 500 mg IM monthly --start on 04/2020  Ibrance 100 mg p.o. daily (21 days on/7 days off)  Xgeva 120 mg subcu every 3 months -next dose 04/2020     Interim History:  Caitlin Greene is in for her first office visit.  I saw her at [facility] a few weeks ago.  She has metastatic breast cancer.  She has bone only metastasis.  She has been treated at [facility].  She has been on Arimidex along with Ibrance.  Her CA 27-29 we checked it today was 78.5.  She was in the hospital because of weakness.  She is having hard time eating.  She is having nausea and vomiting.  She was evaluated.  She had gastroenterology see her.  She had upper endoscopy on 03/25/2020.  This showed a tortuous esophagus.  She had a moderate hiatal hernia.  She had a CT angiogram done of the chest.  This was done when she was admitted on 13 November.  This did not show any pulmonary embolism.  It showed the bony metastasis.  We got her feeling better.  She was able to eat a little bit better.  She now comes in.  Comes in with one of her sisters.  She is feeling okay.  She still does not have much of an appetite.  We will start her on Marinol (5 mg p.o. twice daily) to see if we cannot stimulate her appetite.  Her pain is under fairly good control.  Will change her pain medication around a little bit.  She is on 2 long-acting pain medications, that being OxyContin and methadone.  We will stop the methadone.  If we just try to minimize her medications this certainly might help her quality of life.  I think that Faslodex would not be a bad idea for her instead of Arimidex.  I think Faslodex would be a more potent estrogen inhibitor.  She has had no problems with bowels or bladder.  She may have had  some constipation.  She has had no rash.  Currently, I would say her performance status is by ECOG 1.  Medications:  Current Outpatient Medications:  .  CALCIUM PO, Take 1 tablet by mouth daily., Disp: , Rfl:  .  FLUoxetine (PROZAC) 20 MG capsule, Take 3 capsules (60 mg total) by mouth daily., Disp: 90 capsule, Rfl: 11 .  hydrOXYzine (ATARAX/VISTARIL) 25 MG tablet, Take 25 mg by mouth 3 (three) times daily as needed for anxiety., Disp: , Rfl:  .  IBRANCE 100 MG tablet, Take 100 mg by mouth See admin instructions. 198m daily on day 1-21 of cycle. 7 days off.  Repeat., Disp: , Rfl:  .  omeprazole (PRILOSEC) 20 MG capsule, Take 20 mg by mouth daily., Disp: , Rfl:  .  ondansetron (ZOFRAN) 4 MG tablet, Take 1 tablet (4 mg total) by mouth every 4 (four) hours as needed., Disp: 20 tablet, Rfl: 0 .  oxyCODONE (OXY IR/ROXICODONE) 5 MG immediate release tablet, Take 5 mg by mouth every 4 (four) hours as needed for pain., Disp: , Rfl:  .  oxyCODONE (OXYCONTIN) 15 mg 12 hr tablet, Take 1 tablet (15 mg total) by mouth every 12 (twelve) hours., Disp: 60 tablet, Rfl: 0 .  polyethylene glycol (MIRALAX / GLYCOLAX) 17 g  packet, Take 17 g by mouth daily as needed., Disp: 28 each, Rfl: 0 .  SM SENNA LAXATIVE 8.6 MG tablet, Take 8.6 mg by mouth daily., Disp: , Rfl:  .  vitamin B-12 (CYANOCOBALAMIN) 1000 MCG tablet, Take 1 tablet (1,000 mcg total) by mouth daily., Disp: 30 tablet, Rfl: 1 .  acetaminophen (TYLENOL) 650 MG CR tablet, Take 650 mg by mouth every 8 (eight) hours as needed for pain. (Patient not taking: Reported on 04/07/2020), Disp: , Rfl:  .  dronabinol (MARINOL) 5 MG capsule, Take 1 capsule (5 mg total) by mouth 2 (two) times daily before lunch and supper., Disp: 60 capsule, Rfl: 0 .  famciclovir (FAMVIR) 250 MG tablet, Take 1 tablet (250 mg total) by mouth daily., Disp: 30 tablet, Rfl: 6 .  sucralfate (CARAFATE) 1 GM/10ML suspension, Take 10 mLs (1 g total) by mouth 4 (four) times daily -  with meals  and at bedtime., Disp: 420 mL, Rfl: 6  Allergies:  Allergies  Allergen Reactions  . Amoxicillin-Pot Clavulanate Hives  . Codeine Nausea And Vomiting    Needs pre-meds Needs pre-meds    Past Medical History, Surgical history, Social history, and Family History were reviewed and updated.  Review of Systems: Review of Systems  Constitutional: Positive for appetite change.  HENT:  Negative.   Eyes: Negative.   Respiratory: Negative.   Cardiovascular: Negative.   Gastrointestinal: Positive for abdominal pain and nausea.  Endocrine: Negative.   Genitourinary: Negative.    Musculoskeletal: Positive for arthralgias and back pain.  Skin: Negative.   Neurological: Positive for dizziness.  Hematological: Negative.   Psychiatric/Behavioral: Negative.     Physical Exam:  height is 5' 8.5" (1.74 m) and weight is 156 lb (70.8 kg). Her oral temperature is 98.3 F (36.8 C). Her blood pressure is 130/89 and her pulse is 113 (abnormal). Her oxygen saturation is 99%.   Wt Readings from Last 3 Encounters:  04/07/20 156 lb (70.8 kg)  03/27/20 159 lb (72.1 kg)  03/22/20 165 lb 12.6 oz (75.2 kg)    Physical Exam Vitals reviewed.  HENT:     Head: Normocephalic and atraumatic.  Eyes:     Pupils: Pupils are equal, round, and reactive to light.  Cardiovascular:     Rate and Rhythm: Normal rate and regular rhythm.     Heart sounds: Normal heart sounds.  Pulmonary:     Effort: Pulmonary effort is normal.     Breath sounds: Normal breath sounds.  Abdominal:     General: Bowel sounds are normal.     Palpations: Abdomen is soft.  Musculoskeletal:        General: No tenderness or deformity. Normal range of motion.     Cervical back: Normal range of motion.  Lymphadenopathy:     Cervical: No cervical adenopathy.  Skin:    General: Skin is warm and dry.     Findings: No erythema or rash.  Neurological:     Mental Status: She is alert and oriented to person, place, and time.  Psychiatric:         Behavior: Behavior normal.        Thought Content: Thought content normal.        Judgment: Judgment normal.      Lab Results  Component Value Date   WBC 3.1 (L) 04/07/2020   HGB 14.3 04/07/2020   HCT 40.2 04/07/2020   MCV 105.0 (H) 04/07/2020   PLT 249 04/07/2020     Chemistry  Component Value Date/Time   NA 139 04/07/2020 1033   K 3.3 (L) 04/07/2020 1033   CL 103 04/07/2020 1033   CO2 26 04/07/2020 1033   BUN 12 04/07/2020 1033   CREATININE 0.82 04/07/2020 1033   CREATININE 0.88 10/22/2019 1246      Component Value Date/Time   CALCIUM 9.7 04/07/2020 1033   ALKPHOS 50 04/07/2020 1033   AST 14 (L) 04/07/2020 1033   ALT 11 04/07/2020 1033   BILITOT 0.7 04/07/2020 1033      Impression and Plan: Caitlin Greene is a a very charming 54 year old postmenopausal female with metastatic breast cancer.  We actually had seen her many years ago with a carcinoma in situ.  She is the wife of one of our former patients who passed away from ocular melanoma.  She now has metastatic breast cancer.  This appears to be confined to her bones.  I would like to think that the Faslodex/Ibrance combination would be helpful.  I have to believe that this is going to be effective.  I believe that this should help drive her CA 36.54 down a little bit.  I am glad that she is doing little better.  Hopefully the Marinol will help her appetite.  We will try to get the Faslodex started next week.  I really do not see a downside to Faslodex for her.  If she develops any problems with respect to nausea or vomiting, we might switch out the [place] for ribociclib.  Hopefully, we will find that she responds.  We will give her Delton See when we see her back.  I spent a good 45 minutes with she and her sister.  I gave her a prayer blanket.  She does have a very strong faith.  I will see her back myself in about 3 weeks or so.   [name], MD 12/2/20214:36 PM

## 2020-04-10 ENCOUNTER — Other Ambulatory Visit: Payer: Self-pay | Admitting: Hematology & Oncology

## 2020-04-10 ENCOUNTER — Other Ambulatory Visit: Payer: Self-pay

## 2020-04-10 MED ORDER — MEGESTROL ACETATE 625 MG/5ML PO SUSP: 625.0000 mg | Freq: Every day | ORAL | 3 refills | Status: DC

## 2020-04-15 ENCOUNTER — Inpatient Hospital Stay: Payer: BC Managed Care – PPO | Attending: Hematology & Oncology

## 2020-04-15 ENCOUNTER — Other Ambulatory Visit: Payer: Self-pay

## 2020-04-15 VITALS — BP 113/79 | HR 88 | Temp 98.1°F

## 2020-04-15 DIAGNOSIS — C7951 Secondary malignant neoplasm of bone: Secondary | ICD-10-CM | POA: Insufficient documentation

## 2020-04-15 DIAGNOSIS — Z170 Estrogen receptor positive status [ER+]: Secondary | ICD-10-CM | POA: Diagnosis not present

## 2020-04-15 DIAGNOSIS — C50919 Malignant neoplasm of unspecified site of unspecified female breast: Secondary | ICD-10-CM | POA: Diagnosis not present

## 2020-04-15 DIAGNOSIS — Z79818 Long term (current) use of other agents affecting estrogen receptors and estrogen levels: Secondary | ICD-10-CM | POA: Insufficient documentation

## 2020-04-15 MED ORDER — FULVESTRANT 250 MG/5ML IM SOLN
500.0000 mg | Freq: Once | INTRAMUSCULAR | Status: AC
  Administered 2020-04-15: 500 mg via INTRAMUSCULAR
  Filled 2020-04-15: qty 10

## 2020-04-15 MED ORDER — DENOSUMAB 120 MG/1.7ML ~~LOC~~ SOLN: 120.0000 mg | Freq: Once | SUBCUTANEOUS | Status: DC

## 2020-04-15 MED ORDER — DENOSUMAB 120 MG/1.7ML ~~LOC~~ SOLN
SUBCUTANEOUS | Status: AC
  Filled 2020-04-15: qty 1.7

## 2020-04-15 NOTE — Progress Notes (Signed)
Pt discharged in no apparent distress. Pt left ambulatory without assistance. Pt aware of discharge instructions and verbalized understanding and had no further questions.  

## 2020-04-15 NOTE — Patient Instructions (Signed)
Denosumab injection What is this medicine? DENOSUMAB (den oh sue mab) slows bone breakdown. Prolia is used to treat osteoporosis in women after menopause and in men, and in people who are taking corticosteroids for 6 months or more. Xgeva is used to treat a high calcium level due to cancer and to prevent bone fractures and other bone problems caused by multiple myeloma or cancer bone metastases. Xgeva is also used to treat giant cell tumor of the bone. This medicine may be used for other purposes; ask your health care provider or pharmacist if you have questions. COMMON BRAND NAME(S): Prolia, XGEVA What should I tell my health care provider before I take this medicine? They need to know if you have any of these conditions:  dental disease  having surgery or tooth extraction  infection  kidney disease  low levels of calcium or Vitamin D in the blood  malnutrition  on hemodialysis  skin conditions or sensitivity  thyroid or parathyroid disease  an unusual reaction to denosumab, other medicines, foods, dyes, or preservatives  pregnant or trying to get pregnant  breast-feeding How should I use this medicine? This medicine is for injection under the skin. It is given by a health care professional in a hospital or clinic setting. A special MedGuide will be given to you before each treatment. Be sure to read this information carefully each time. For Prolia, talk to your pediatrician regarding the use of this medicine in children. Special care may be needed. For Xgeva, talk to your pediatrician regarding the use of this medicine in children. While this drug may be prescribed for children as young as 13 years for selected conditions, precautions do apply. Overdosage: If you think you have taken too much of this medicine contact a poison control center or emergency room at once. NOTE: This medicine is only for you. Do not share this medicine with others. What if I miss a dose? It is  important not to miss your dose. Call your doctor or health care professional if you are unable to keep an appointment. What may interact with this medicine? Do not take this medicine with any of the following medications:  other medicines containing denosumab This medicine may also interact with the following medications:  medicines that lower your chance of fighting infection  steroid medicines like prednisone or cortisone This list may not describe all possible interactions. Give your health care provider a list of all the medicines, herbs, non-prescription drugs, or dietary supplements you use. Also tell them if you smoke, drink alcohol, or use illegal drugs. Some items may interact with your medicine. What should I watch for while using this medicine? Visit your doctor or health care professional for regular checks on your progress. Your doctor or health care professional may order blood tests and other tests to see how you are doing. Call your doctor or health care professional for advice if you get a fever, chills or sore throat, or other symptoms of a cold or flu. Do not treat yourself. This drug may decrease your body's ability to fight infection. Try to avoid being around people who are sick. You should make sure you get enough calcium and vitamin D while you are taking this medicine, unless your doctor tells you not to. Discuss the foods you eat and the vitamins you take with your health care professional. See your dentist regularly. Brush and floss your teeth as directed. Before you have any dental work done, tell your dentist you are   receiving this medicine. Do not become pregnant while taking this medicine or for 5 months after stopping it. Talk with your doctor or health care professional about your birth control options while taking this medicine. Women should inform their doctor if they wish to become pregnant or think they might be pregnant. There is a potential for serious side  effects to an unborn child. Talk to your health care professional or pharmacist for more information. What side effects may I notice from receiving this medicine? Side effects that you should report to your doctor or health care professional as soon as possible:  allergic reactions like skin rash, itching or hives, swelling of the face, lips, or tongue  bone pain  breathing problems  dizziness  jaw pain, especially after dental work  redness, blistering, peeling of the skin  signs and symptoms of infection like fever or chills; cough; sore throat; pain or trouble passing urine  signs of low calcium like fast heartbeat, muscle cramps or muscle pain; pain, tingling, numbness in the hands or feet; seizures  unusual bleeding or bruising  unusually weak or tired Side effects that usually do not require medical attention (report to your doctor or health care professional if they continue or are bothersome):  constipation  diarrhea  headache  joint pain  loss of appetite  muscle pain  runny nose  tiredness  upset stomach This list may not describe all possible side effects. Call your doctor for medical advice about side effects. You may report side effects to FDA at 1-800-FDA-1088. Where should I keep my medicine? This medicine is only given in a clinic, doctor's office, or other health care setting and will not be stored at home. NOTE: This sheet is a summary. It may not cover all possible information. If you have questions about this medicine, talk to your doctor, pharmacist, or health care provider.  2020 Elsevier/Gold Standard (2017-09-01 16:10:44)   Fulvestrant injection What is this medicine? FULVESTRANT (ful VES trant) blocks the effects of estrogen. It is used to treat breast cancer. This medicine may be used for other purposes; ask your health care provider or pharmacist if you have questions. COMMON BRAND NAME(S): FASLODEX What should I tell my health care  provider before I take this medicine? They need to know if you have any of these conditions:  bleeding disorders  liver disease  low blood counts, like low white cell, platelet, or red cell counts  an unusual or allergic reaction to fulvestrant, other medicines, foods, dyes, or preservatives  pregnant or trying to get pregnant  breast-feeding How should I use this medicine? This medicine is for injection into a muscle. It is usually given by a health care professional in a hospital or clinic setting. Talk to your pediatrician regarding the use of this medicine in children. Special care may be needed. Overdosage: If you think you have taken too much of this medicine contact a poison control center or emergency room at once. NOTE: This medicine is only for you. Do not share this medicine with others. What if I miss a dose? It is important not to miss your dose. Call your doctor or health care professional if you are unable to keep an appointment. What may interact with this medicine?  medicines that treat or prevent blood clots like warfarin, enoxaparin, dalteparin, apixaban, dabigatran, and rivaroxaban This list may not describe all possible interactions. Give your health care provider a list of all the medicines, herbs, non-prescription drugs, or dietary supplements  you use. Also tell them if you smoke, drink alcohol, or use illegal drugs. Some items may interact with your medicine. What should I watch for while using this medicine? Your condition will be monitored carefully while you are receiving this medicine. You will need important blood work done while you are taking this medicine. Do not become pregnant while taking this medicine or for at least 1 year after stopping it. Women of child-bearing potential will need to have a negative pregnancy test before starting this medicine. Women should inform their doctor if they wish to become pregnant or think they might be pregnant. There is  a potential for serious side effects to an unborn child. Men should inform their doctors if they wish to father a child. This medicine may lower sperm counts. Talk to your health care professional or pharmacist for more information. Do not breast-feed an infant while taking this medicine or for 1 year after the last dose. What side effects may I notice from receiving this medicine? Side effects that you should report to your doctor or health care professional as soon as possible:  allergic reactions like skin rash, itching or hives, swelling of the face, lips, or tongue  feeling faint or lightheaded, falls  pain, tingling, numbness, or weakness in the legs  signs and symptoms of infection like fever or chills; cough; flu-like symptoms; sore throat  vaginal bleeding Side effects that usually do not require medical attention (report to your doctor or health care professional if they continue or are bothersome):  aches, pains  constipation  diarrhea  headache  hot flashes  nausea, vomiting  pain at site where injected  stomach pain This list may not describe all possible side effects. Call your doctor for medical advice about side effects. You may report side effects to FDA at 1-800-FDA-1088. Where should I keep my medicine? This drug is given in a hospital or clinic and will not be stored at home. NOTE: This sheet is a summary. It may not cover all possible information. If you have questions about this medicine, talk to your doctor, pharmacist, or health care provider.  2020 Elsevier/Gold Standard (2017-08-03 11:34:41)

## 2020-04-27 ENCOUNTER — Other Ambulatory Visit: Payer: Self-pay | Admitting: *Deleted

## 2020-04-27 MED ORDER — OXYCODONE HCL ER 15 MG PO T12A: 15.0000 mg | EXTENDED_RELEASE_TABLET | Freq: Two times a day (BID) | ORAL | 0 refills | Status: DC

## 2020-04-30 ENCOUNTER — Other Ambulatory Visit: Payer: Self-pay

## 2020-04-30 ENCOUNTER — Inpatient Hospital Stay: Payer: BC Managed Care – PPO

## 2020-04-30 VITALS — BP 125/83 | HR 75 | Temp 97.9°F | Resp 18

## 2020-04-30 DIAGNOSIS — C7951 Secondary malignant neoplasm of bone: Secondary | ICD-10-CM | POA: Diagnosis not present

## 2020-04-30 DIAGNOSIS — C50919 Malignant neoplasm of unspecified site of unspecified female breast: Secondary | ICD-10-CM | POA: Diagnosis not present

## 2020-04-30 DIAGNOSIS — Z79818 Long term (current) use of other agents affecting estrogen receptors and estrogen levels: Secondary | ICD-10-CM | POA: Diagnosis not present

## 2020-04-30 DIAGNOSIS — Z170 Estrogen receptor positive status [ER+]: Secondary | ICD-10-CM | POA: Diagnosis not present

## 2020-04-30 MED ORDER — FULVESTRANT 250 MG/5ML IM SOLN
500.0000 mg | Freq: Once | INTRAMUSCULAR | Status: AC
  Administered 2020-04-30: 500 mg via INTRAMUSCULAR

## 2020-04-30 MED ORDER — FULVESTRANT 250 MG/5ML IM SOLN
INTRAMUSCULAR | Status: AC
  Filled 2020-04-30: qty 5

## 2020-04-30 NOTE — Patient Instructions (Signed)
Fulvestrant injection What is this medicine? FULVESTRANT (ful VES trant) blocks the effects of estrogen. It is used to treat breast cancer. This medicine may be used for other purposes; ask your health care provider or pharmacist if you have questions. COMMON BRAND NAME(S): FASLODEX What should I tell my health care provider before I take this medicine? They need to know if you have any of these conditions:  bleeding disorders  liver disease  low blood counts, like low white cell, platelet, or red cell counts  an unusual or allergic reaction to fulvestrant, other medicines, foods, dyes, or preservatives  pregnant or trying to get pregnant  breast-feeding How should I use this medicine? This medicine is for injection into a muscle. It is usually given by a health care professional in a hospital or clinic setting. Talk to your pediatrician regarding the use of this medicine in children. Special care may be needed. Overdosage: If you think you have taken too much of this medicine contact a poison control center or emergency room at once. NOTE: This medicine is only for you. Do not share this medicine with others. What if I miss a dose? It is important not to miss your dose. Call your doctor or health care professional if you are unable to keep an appointment. What may interact with this medicine?  medicines that treat or prevent blood clots like warfarin, enoxaparin, dalteparin, apixaban, dabigatran, and rivaroxaban This list may not describe all possible interactions. Give your health care provider a list of all the medicines, herbs, non-prescription drugs, or dietary supplements you use. Also tell them if you smoke, drink alcohol, or use illegal drugs. Some items may interact with your medicine. What should I watch for while using this medicine? Your condition will be monitored carefully while you are receiving this medicine. You will need important blood work done while you are taking  this medicine. Do not become pregnant while taking this medicine or for at least 1 year after stopping it. Women of child-bearing potential will need to have a negative pregnancy test before starting this medicine. Women should inform their doctor if they wish to become pregnant or think they might be pregnant. There is a potential for serious side effects to an unborn child. Men should inform their doctors if they wish to father a child. This medicine may lower sperm counts. Talk to your health care professional or pharmacist for more information. Do not breast-feed an infant while taking this medicine or for 1 year after the last dose. What side effects may I notice from receiving this medicine? Side effects that you should report to your doctor or health care professional as soon as possible:  allergic reactions like skin rash, itching or hives, swelling of the face, lips, or tongue  feeling faint or lightheaded, falls  pain, tingling, numbness, or weakness in the legs  signs and symptoms of infection like fever or chills; cough; flu-like symptoms; sore throat  vaginal bleeding Side effects that usually do not require medical attention (report to your doctor or health care professional if they continue or are bothersome):  aches, pains  constipation  diarrhea  headache  hot flashes  nausea, vomiting  pain at site where injected  stomach pain This list may not describe all possible side effects. Call your doctor for medical advice about side effects. You may report side effects to FDA at 1-800-FDA-1088. Where should I keep my medicine? This drug is given in a hospital or clinic and will   not be stored at home. NOTE: This sheet is a summary. It may not cover all possible information. If you have questions about this medicine, talk to your doctor, pharmacist, or health care provider.  2020 [employer]vier/Gold Standard (2017-08-03 11:34:41)  

## 2020-05-04 ENCOUNTER — Encounter: Payer: Self-pay | Admitting: *Deleted

## 2020-05-07 DIAGNOSIS — S32000A Wedge compression fracture of unspecified lumbar vertebra, initial encounter for closed fracture: Secondary | ICD-10-CM | POA: Diagnosis not present

## 2020-05-07 DIAGNOSIS — M545 Low back pain, unspecified: Secondary | ICD-10-CM | POA: Diagnosis not present

## 2020-05-14 ENCOUNTER — Inpatient Hospital Stay (HOSPITAL_BASED_OUTPATIENT_CLINIC_OR_DEPARTMENT_OTHER): Payer: BC Managed Care – PPO | Admitting: Hematology & Oncology

## 2020-05-14 ENCOUNTER — Inpatient Hospital Stay: Payer: BC Managed Care – PPO

## 2020-05-14 ENCOUNTER — Other Ambulatory Visit: Payer: Self-pay

## 2020-05-14 ENCOUNTER — Inpatient Hospital Stay: Payer: BC Managed Care – PPO | Attending: Hematology & Oncology

## 2020-05-14 VITALS — BP 113/71 | HR 112 | Temp 98.4°F | Resp 18 | Wt 154.0 lb

## 2020-05-14 DIAGNOSIS — C7951 Secondary malignant neoplasm of bone: Secondary | ICD-10-CM | POA: Diagnosis not present

## 2020-05-14 DIAGNOSIS — R232 Flushing: Secondary | ICD-10-CM | POA: Diagnosis not present

## 2020-05-14 DIAGNOSIS — Z79899 Other long term (current) drug therapy: Secondary | ICD-10-CM | POA: Insufficient documentation

## 2020-05-14 DIAGNOSIS — C50919 Malignant neoplasm of unspecified site of unspecified female breast: Secondary | ICD-10-CM

## 2020-05-14 DIAGNOSIS — R109 Unspecified abdominal pain: Secondary | ICD-10-CM | POA: Insufficient documentation

## 2020-05-14 DIAGNOSIS — R110 Nausea: Secondary | ICD-10-CM | POA: Diagnosis not present

## 2020-05-14 DIAGNOSIS — Z170 Estrogen receptor positive status [ER+]: Secondary | ICD-10-CM | POA: Insufficient documentation

## 2020-05-14 LAB — CBC WITH DIFFERENTIAL (CANCER CENTER ONLY)
Abs Immature Granulocytes: 0.01 10*3/uL (ref 0.00–0.07)
Basophils Absolute: 0 10*3/uL (ref 0.0–0.1)
Basophils Relative: 1 %
Eosinophils Absolute: 0 10*3/uL (ref 0.0–0.5)
Eosinophils Relative: 1 %
HCT: 36.3 % (ref 36.0–46.0)
Hemoglobin: 12.7 g/dL (ref 12.0–15.0)
Immature Granulocytes: 0 %
Lymphocytes Relative: 43 %
Lymphs Abs: 1.2 10*3/uL (ref 0.7–4.0)
MCH: 37.5 pg — ABNORMAL HIGH (ref 26.0–34.0)
MCHC: 35 g/dL (ref 30.0–36.0)
MCV: 107.1 fL — ABNORMAL HIGH (ref 80.0–100.0)
Monocytes Absolute: 0.2 10*3/uL (ref 0.1–1.0)
Monocytes Relative: 6 %
Neutro Abs: 1.4 10*3/uL — ABNORMAL LOW (ref 1.7–7.7)
Neutrophils Relative %: 49 %
Platelet Count: 192 10*3/uL (ref 150–400)
RBC: 3.39 MIL/uL — ABNORMAL LOW (ref 3.87–5.11)
RDW: 13.6 % (ref 11.5–15.5)
WBC Count: 2.8 10*3/uL — ABNORMAL LOW (ref 4.0–10.5)
nRBC: 0 % (ref 0.0–0.2)

## 2020-05-14 LAB — CMP (CANCER CENTER ONLY)
ALT: 10 U/L (ref 0–44)
AST: 8 U/L — ABNORMAL LOW (ref 15–41)
Albumin: 4.4 g/dL (ref 3.5–5.0)
Alkaline Phosphatase: 47 U/L (ref 38–126)
Anion gap: 7 (ref 5–15)
BUN: 14 mg/dL (ref 6–20)
CO2: 31 mmol/L (ref 22–32)
Calcium: 10.3 mg/dL (ref 8.9–10.3)
Chloride: 105 mmol/L (ref 98–111)
Creatinine: 0.82 mg/dL (ref 0.44–1.00)
GFR, Estimated: >60 mL/min (ref >60)
Glucose, Bld: 82 mg/dL (ref 70–99)
Potassium: 3.6 mmol/L (ref 3.5–5.1)
Sodium: 143 mmol/L (ref 135–145)
Total Bilirubin: 0.5 mg/dL (ref 0.3–1.2)
Total Protein: 6.8 g/dL (ref 6.5–8.1)

## 2020-05-14 LAB — LACTATE DEHYDROGENASE: LDH: 147 U/L (ref 98–192)

## 2020-05-14 MED ORDER — DENOSUMAB 120 MG/1.7ML ~~LOC~~ SOLN
SUBCUTANEOUS | Status: AC
  Filled 2020-05-14: qty 1.7

## 2020-05-14 MED ORDER — FULVESTRANT 250 MG/5ML IM SOLN
INTRAMUSCULAR | Status: AC
  Filled 2020-05-14: qty 10

## 2020-05-14 MED ORDER — DENOSUMAB 120 MG/1.7ML ~~LOC~~ SOLN
120.0000 mg | Freq: Once | SUBCUTANEOUS | Status: AC
  Administered 2020-05-14: 120 mg via SUBCUTANEOUS

## 2020-05-14 MED ORDER — FULVESTRANT 250 MG/5ML IM SOLN
500.0000 mg | Freq: Once | INTRAMUSCULAR | Status: AC
Start: 2020-05-14 — End: 2020-05-14
  Administered 2020-05-14: 500 mg via INTRAMUSCULAR

## 2020-05-14 NOTE — Patient Instructions (Signed)
Denosumab injection What is this medicine? DENOSUMAB (den oh sue mab) slows bone breakdown. Prolia is used to treat osteoporosis in women after menopause and in men, and in people who are taking corticosteroids for 6 months or more. Xgeva is used to treat a high calcium level due to cancer and to prevent bone fractures and other bone problems caused by multiple myeloma or cancer bone metastases. Xgeva is also used to treat giant cell tumor of the bone. This medicine may be used for other purposes; ask your health care provider or pharmacist if you have questions. COMMON BRAND NAME(S): Prolia, XGEVA What should I tell my health care provider before I take this medicine? They need to know if you have any of these conditions:  dental disease  having surgery or tooth extraction  infection  kidney disease  low levels of calcium or Vitamin D in the blood  malnutrition  on hemodialysis  skin conditions or sensitivity  thyroid or parathyroid disease  an unusual reaction to denosumab, other medicines, foods, dyes, or preservatives  pregnant or trying to get pregnant  breast-feeding How should I use this medicine? This medicine is for injection under the skin. It is given by a health care professional in a hospital or clinic setting. A special MedGuide will be given to you before each treatment. Be sure to read this information carefully each time. For Prolia, talk to your pediatrician regarding the use of this medicine in children. Special care may be needed. For Xgeva, talk to your pediatrician regarding the use of this medicine in children. While this drug may be prescribed for children as young as 13 years for selected conditions, precautions do apply. Overdosage: If you think you have taken too much of this medicine contact a poison control center or emergency room at once. NOTE: This medicine is only for you. Do not share this medicine with others. What if I miss a dose? It is  important not to miss your dose. Call your doctor or health care professional if you are unable to keep an appointment. What may interact with this medicine? Do not take this medicine with any of the following medications:  other medicines containing denosumab This medicine may also interact with the following medications:  medicines that lower your chance of fighting infection  steroid medicines like prednisone or cortisone This list may not describe all possible interactions. Give your health care provider a list of all the medicines, herbs, non-prescription drugs, or dietary supplements you use. Also tell them if you smoke, drink alcohol, or use illegal drugs. Some items may interact with your medicine. What should I watch for while using this medicine? Visit your doctor or health care professional for regular checks on your progress. Your doctor or health care professional may order blood tests and other tests to see how you are doing. Call your doctor or health care professional for advice if you get a fever, chills or sore throat, or other symptoms of a cold or flu. Do not treat yourself. This drug may decrease your body's ability to fight infection. Try to avoid being around people who are sick. You should make sure you get enough calcium and vitamin D while you are taking this medicine, unless your doctor tells you not to. Discuss the foods you eat and the vitamins you take with your health care professional. See your dentist regularly. Brush and floss your teeth as directed. Before you have any dental work done, tell your dentist you are   receiving this medicine. Do not become pregnant while taking this medicine or for 5 months after stopping it. Talk with your doctor or health care professional about your birth control options while taking this medicine. Women should inform their doctor if they wish to become pregnant or think they might be pregnant. There is a potential for serious side  effects to an unborn child. Talk to your health care professional or pharmacist for more information. What side effects may I notice from receiving this medicine? Side effects that you should report to your doctor or health care professional as soon as possible:  allergic reactions like skin rash, itching or hives, swelling of the face, lips, or tongue  bone pain  breathing problems  dizziness  jaw pain, especially after dental work  redness, blistering, peeling of the skin  signs and symptoms of infection like fever or chills; cough; sore throat; pain or trouble passing urine  signs of low calcium like fast heartbeat, muscle cramps or muscle pain; pain, tingling, numbness in the hands or feet; seizures  unusual bleeding or bruising  unusually weak or tired Side effects that usually do not require medical attention (report to your doctor or health care professional if they continue or are bothersome):  constipation  diarrhea  headache  joint pain  loss of appetite  muscle pain  runny nose  tiredness  upset stomach This list may not describe all possible side effects. Call your doctor for medical advice about side effects. You may report side effects to FDA at 1-800-FDA-1088. Where should I keep my medicine? This medicine is only given in a clinic, doctor's office, or other health care setting and will not be stored at home. NOTE: This sheet is a summary. It may not cover all possible information. If you have questions about this medicine, talk to your doctor, pharmacist, or health care provider.  2020 Else[employer]/Gold Standard (2017-09-01 16:10:44) Fulvestrant injection What is this medicine? FULVESTRANT (ful VES trant) blocks the effects of estrogen. It is used to treat breast cancer. This medicine may be used for other purposes; ask your health care provider or pharmacist if you have questions. COMMON BRAND NAME(S): FASLODEX What should I tell my health care  provider before I take this medicine? They need to know if you have any of these conditions:  bleeding disorders  liver disease  low blood counts, like low white cell, platelet, or red cell counts  an unusual or allergic reaction to fulvestrant, other medicines, foods, dyes, or preservatives  pregnant or trying to get pregnant  breast-feeding How should I use this medicine? This medicine is for injection into a muscle. It is usually given by a health care professional in a hospital or clinic setting. Talk to your pediatrician regarding the use of this medicine in children. Special care may be needed. Overdosage: If you think you have taken too much of this medicine contact a poison control center or emergency room at once. NOTE: This medicine is only for you. Do not share this medicine with others. What if I miss a dose? It is important not to miss your dose. Call your doctor or health care professional if you are unable to keep an appointment. What may interact with this medicine?  medicines that treat or prevent blood clots like warfarin, enoxaparin, dalteparin, apixaban, dabigatran, and rivaroxaban This list may not describe all possible interactions. Give your health care provider a list of all the medicines, herbs, non-prescription drugs, or dietary supplements you use.   Also tell them if you smoke, drink alcohol, or use illegal drugs. Some items may interact with your medicine. What should I watch for while using this medicine? Your condition will be monitored carefully while you are receiving this medicine. You will need important blood work done while you are taking this medicine. Do not become pregnant while taking this medicine or for at least 1 year after stopping it. Women of child-bearing potential will need to have a negative pregnancy test before starting this medicine. Women should inform their doctor if they wish to become pregnant or think they might be pregnant. There is  a potential for serious side effects to an unborn child. Men should inform their doctors if they wish to father a child. This medicine may lower sperm counts. Talk to your health care professional or pharmacist for more information. Do not breast-feed an infant while taking this medicine or for 1 year after the last dose. What side effects may I notice from receiving this medicine? Side effects that you should report to your doctor or health care professional as soon as possible:  allergic reactions like skin rash, itching or hives, swelling of the face, lips, or tongue  feeling faint or lightheaded, falls  pain, tingling, numbness, or weakness in the legs  signs and symptoms of infection like fever or chills; cough; flu-like symptoms; sore throat  vaginal bleeding Side effects that usually do not require medical attention (report to your doctor or health care professional if they continue or are bothersome):  aches, pains  constipation  diarrhea  headache  hot flashes  nausea, vomiting  pain at site where injected  stomach pain This list may not describe all possible side effects. Call your doctor for medical advice about side effects. You may report side effects to FDA at 1-800-FDA-1088. Where should I keep my medicine? This drug is given in a hospital or clinic and will not be stored at home. NOTE: This sheet is a summary. It may not cover all possible information. If you have questions about this medicine, talk to your doctor, pharmacist, or health care provider.  2020 [employer]/Gold Standard (2017-08-03 11:34:41)  

## 2020-05-14 NOTE — Progress Notes (Signed)
Hematology and Oncology Follow Up Visit  Caitlin Greene [id number] 1966-01-03 55 y.o. 05/14/2020   Principle Diagnosis:   Metastatic breast cancer-ER positive/HER-2 negative --bone metastasis only  Current Therapy:    Faslodex 500 mg IM monthly --start on 04/2020  Ibrance 100 mg p.o. daily (21 days on/7 days off)  Xgeva 120 mg subcu every 3 months -next dose 08/2020     Interim History:  Caitlin Greene is in for follow-up.  She is doing okay.  She had a decent holiday season.  She started the Faslodex.  She is having a bit of hot flashes with this.  She is not been able to get the Marinol or the Megace ES.  These were not approved.  She said that she is eating a little bit better without these.  She says her pain is doing all right.  The OxyContin is little bit difficult on her.  However, she wants to continue on this.  Her last CA 27.29 back in November was 27.  It will be interesting to see what it is this time.  She has had no problems with diarrhea.  There is been no bleeding.  She has had no issues with leg swelling.  She has had no headaches.  She is really not able to work because of her medications.  We will start her Delton See today.  Currently, I would say her performance status is ECOG 1.   Medications:  Current Outpatient Medications:  .  acetaminophen (TYLENOL) 650 MG CR tablet, Take 650 mg by mouth every 8 (eight) hours as needed for pain. (Patient not taking: Reported on 04/07/2020), Disp: , Rfl:  .  CALCIUM PO, Take 1 tablet by mouth daily., Disp: , Rfl:  .  famciclovir (FAMVIR) 250 MG tablet, Take 1 tablet (250 mg total) by mouth daily., Disp: 30 tablet, Rfl: 6 .  FLUoxetine (PROZAC) 20 MG capsule, Take 3 capsules (60 mg total) by mouth daily., Disp: 90 capsule, Rfl: 11 .  hydrOXYzine (ATARAX/VISTARIL) 25 MG tablet, Take 25 mg by mouth 3 (three) times daily as needed for anxiety., Disp: , Rfl:  .  IBRANCE 100 MG tablet, Take 100 mg by mouth See admin instructions.  128m daily on day 1-21 of cycle. 7 days off.  Repeat., Disp: , Rfl:  .  megestrol (MEGACE ES) 625 MG/5ML suspension, Take 5 mLs (625 mg total) by mouth daily., Disp: 150 mL, Rfl: 3 .  omeprazole (PRILOSEC) 20 MG capsule, Take 20 mg by mouth daily., Disp: , Rfl:  .  ondansetron (ZOFRAN) 4 MG tablet, Take 1 tablet (4 mg total) by mouth every 4 (four) hours as needed., Disp: 20 tablet, Rfl: 0 .  oxyCODONE (OXY IR/ROXICODONE) 5 MG immediate release tablet, Take 5 mg by mouth every 4 (four) hours as needed for pain., Disp: , Rfl:  .  oxyCODONE (OXYCONTIN) 15 mg 12 hr tablet, Take 1 tablet (15 mg total) by mouth every 12 (twelve) hours., Disp: 60 tablet, Rfl: 0 .  polyethylene glycol (MIRALAX / GLYCOLAX) 17 g packet, Take 17 g by mouth daily as needed., Disp: 28 each, Rfl: 0 .  SM SENNA LAXATIVE 8.6 MG tablet, Take 8.6 mg by mouth daily., Disp: , Rfl:  .  sucralfate (CARAFATE) 1 GM/10ML suspension, Take 10 mLs (1 g total) by mouth 4 (four) times daily -  with meals and at bedtime., Disp: 420 mL, Rfl: 6 .  vitamin B-12 (CYANOCOBALAMIN) 1000 MCG tablet, Take 1 tablet (1,000 mcg total) by mouth  daily., Disp: 30 tablet, Rfl: 1  Allergies:  Allergies  Allergen Reactions  . Amoxicillin-Pot Clavulanate Hives  . Codeine Nausea And Vomiting    Needs pre-meds Needs pre-meds    Past Medical History, Surgical history, Social history, and Family History were reviewed and updated.  Review of Systems: Review of Systems  Constitutional: Positive for appetite change.  HENT:  Negative.   Eyes: Negative.   Respiratory: Negative.   Cardiovascular: Negative.   Gastrointestinal: Positive for abdominal pain and nausea.  Endocrine: Negative.   Genitourinary: Negative.    Musculoskeletal: Positive for arthralgias and back pain.  Skin: Negative.   Neurological: Positive for dizziness.  Hematological: Negative.   Psychiatric/Behavioral: Negative.     Physical Exam:  weight is 154 lb (69.9 kg). Her oral  temperature is 98.4 F (36.9 C). Her blood pressure is 113/71 and her pulse is 112 (abnormal). Her respiration is 18 and oxygen saturation is 100%.   Wt Readings from Last 3 Encounters:  05/14/20 154 lb (69.9 kg)  04/07/20 156 lb (70.8 kg)  03/27/20 159 lb (72.1 kg)    Physical Exam Vitals reviewed.  HENT:     Head: Normocephalic and atraumatic.  Eyes:     Pupils: Pupils are equal, round, and reactive to light.  Cardiovascular:     Rate and Rhythm: Normal rate and regular rhythm.     Heart sounds: Normal heart sounds.  Pulmonary:     Effort: Pulmonary effort is normal.     Breath sounds: Normal breath sounds.  Abdominal:     General: Bowel sounds are normal.     Palpations: Abdomen is soft.  Musculoskeletal:        General: No tenderness or deformity. Normal range of motion.     Cervical back: Normal range of motion.  Lymphadenopathy:     Cervical: No cervical adenopathy.  Skin:    General: Skin is warm and dry.     Findings: No erythema or rash.  Neurological:     Mental Status: She is alert and oriented to person, place, and time.  Psychiatric:        Behavior: Behavior normal.        Thought Content: Thought content normal.        Judgment: Judgment normal.      Lab Results  Component Value Date   WBC 2.8 (L) 05/14/2020   HGB 12.7 05/14/2020   HCT 36.3 05/14/2020   MCV 107.1 (H) 05/14/2020   PLT 192 05/14/2020     Chemistry      Component Value Date/Time   NA 143 05/14/2020 1405   K 3.6 05/14/2020 1405   CL 105 05/14/2020 1405   CO2 31 05/14/2020 1405   BUN 14 05/14/2020 1405   CREATININE 0.82 05/14/2020 1405   CREATININE 0.88 10/22/2019 1246      Component Value Date/Time   CALCIUM 10.3 05/14/2020 1405   ALKPHOS 47 05/14/2020 1405   AST 8 (L) 05/14/2020 1405   ALT 10 05/14/2020 1405   BILITOT 0.5 05/14/2020 1405      Impression and Plan: Caitlin Greene is a a very charming 55 year old postmenopausal female with metastatic breast cancer.  We  actually had seen her many years ago with a carcinoma in situ.  She is the wife of one of our former patients who passed away from ocular melanoma.  She now has metastatic breast cancer.  This appears to be confined to her bones.  I would like to think that  the Faslodex/Ibrance combination would be helpful.  I have to believe that this is going to be effective.  I believe that this should help drive her CA 44.96 down a little bit.  I am glad that she is eating a little better.  Hopefully, she can go without an appetite stimulant.  We will do the Xgeva today.  I will plan to get her back in 4 weeks.    I do not think we have to do any scans on her right now.   [name], MD 1/6/20222:58 PM

## 2020-05-15 LAB — CANCER ANTIGEN 27.29: CA 27.29: 58.8 U/mL — ABNORMAL HIGH (ref 0.0–38.6)

## 2020-06-01 ENCOUNTER — Other Ambulatory Visit: Payer: Self-pay | Admitting: *Deleted

## 2020-06-01 MED ORDER — OXYCODONE HCL ER 15 MG PO T12A: 15.0000 mg | EXTENDED_RELEASE_TABLET | Freq: Two times a day (BID) | ORAL | 0 refills | Status: DC

## 2020-06-07 DIAGNOSIS — M545 Low back pain, unspecified: Secondary | ICD-10-CM | POA: Diagnosis not present

## 2020-06-07 DIAGNOSIS — S32000A Wedge compression fracture of unspecified lumbar vertebra, initial encounter for closed fracture: Secondary | ICD-10-CM | POA: Diagnosis not present

## 2020-06-11 ENCOUNTER — Telehealth: Payer: Self-pay

## 2020-06-11 ENCOUNTER — Inpatient Hospital Stay: Payer: BC Managed Care – PPO

## 2020-06-11 ENCOUNTER — Inpatient Hospital Stay: Payer: BC Managed Care – PPO | Admitting: Hematology & Oncology

## 2020-06-11 ENCOUNTER — Inpatient Hospital Stay: Payer: BC Managed Care – PPO | Attending: Hematology & Oncology

## 2020-06-11 ENCOUNTER — Other Ambulatory Visit: Payer: Self-pay

## 2020-06-11 ENCOUNTER — Encounter: Payer: Self-pay | Admitting: Hematology & Oncology

## 2020-06-11 VITALS — BP 110/77 | HR 93 | Temp 97.6°F | Resp 20 | Ht 68.5 in | Wt 156.1 lb

## 2020-06-11 DIAGNOSIS — Z170 Estrogen receptor positive status [ER+]: Secondary | ICD-10-CM | POA: Insufficient documentation

## 2020-06-11 DIAGNOSIS — R232 Flushing: Secondary | ICD-10-CM | POA: Insufficient documentation

## 2020-06-11 DIAGNOSIS — C7951 Secondary malignant neoplasm of bone: Secondary | ICD-10-CM | POA: Insufficient documentation

## 2020-06-11 DIAGNOSIS — C50919 Malignant neoplasm of unspecified site of unspecified female breast: Secondary | ICD-10-CM | POA: Diagnosis not present

## 2020-06-11 DIAGNOSIS — Z79899 Other long term (current) drug therapy: Secondary | ICD-10-CM | POA: Diagnosis not present

## 2020-06-11 DIAGNOSIS — Z79811 Long term (current) use of aromatase inhibitors: Secondary | ICD-10-CM | POA: Diagnosis not present

## 2020-06-11 DIAGNOSIS — R519 Headache, unspecified: Secondary | ICD-10-CM | POA: Insufficient documentation

## 2020-06-11 LAB — CBC WITH DIFFERENTIAL (CANCER CENTER ONLY)
Abs Immature Granulocytes: 0.01 10*3/uL (ref 0.00–0.07)
Basophils Absolute: 0 10*3/uL (ref 0.0–0.1)
Basophils Relative: 1 %
Eosinophils Absolute: 0.1 10*3/uL (ref 0.0–0.5)
Eosinophils Relative: 2 %
HCT: 34.3 % — ABNORMAL LOW (ref 36.0–46.0)
Hemoglobin: 12.2 g/dL (ref 12.0–15.0)
Immature Granulocytes: 0 %
Lymphocytes Relative: 43 %
Lymphs Abs: 1.1 10*3/uL (ref 0.7–4.0)
MCH: 37.4 pg — ABNORMAL HIGH (ref 26.0–34.0)
MCHC: 35.6 g/dL (ref 30.0–36.0)
MCV: 105.2 fL — ABNORMAL HIGH (ref 80.0–100.0)
Monocytes Absolute: 0.2 10*3/uL (ref 0.1–1.0)
Monocytes Relative: 7 %
Neutro Abs: 1.2 10*3/uL — ABNORMAL LOW (ref 1.7–7.7)
Neutrophils Relative %: 47 %
Platelet Count: 150 10*3/uL (ref 150–400)
RBC: 3.26 MIL/uL — ABNORMAL LOW (ref 3.87–5.11)
RDW: 13.9 % (ref 11.5–15.5)
WBC Count: 2.6 10*3/uL — ABNORMAL LOW (ref 4.0–10.5)
nRBC: 0 % (ref 0.0–0.2)

## 2020-06-11 LAB — CMP (CANCER CENTER ONLY)
ALT: 10 U/L (ref 0–44)
AST: 11 U/L — ABNORMAL LOW (ref 15–41)
Albumin: 4.4 g/dL (ref 3.5–5.0)
Alkaline Phosphatase: 45 U/L (ref 38–126)
Anion gap: 6 (ref 5–15)
BUN: 12 mg/dL (ref 6–20)
CO2: 30 mmol/L (ref 22–32)
Calcium: 9 mg/dL (ref 8.9–10.3)
Chloride: 105 mmol/L (ref 98–111)
Creatinine: 0.85 mg/dL (ref 0.44–1.00)
GFR, Estimated: >60 mL/min (ref >60)
Glucose, Bld: 93 mg/dL (ref 70–99)
Potassium: 3.4 mmol/L — ABNORMAL LOW (ref 3.5–5.1)
Sodium: 141 mmol/L (ref 135–145)
Total Bilirubin: 0.7 mg/dL (ref 0.3–1.2)
Total Protein: 6.7 g/dL (ref 6.5–8.1)

## 2020-06-11 MED ORDER — FULVESTRANT 250 MG/5ML IM SOLN
INTRAMUSCULAR | Status: AC
  Filled 2020-06-11: qty 10

## 2020-06-11 MED ORDER — FULVESTRANT 250 MG/5ML IM SOLN
500.0000 mg | Freq: Once | INTRAMUSCULAR | Status: AC
  Administered 2020-06-11: 500 mg via INTRAMUSCULAR

## 2020-06-11 NOTE — Progress Notes (Signed)
Hematology and Oncology Follow Up Visit  Chole Driver [id number] [birth year] 55 y.o. 06/11/2020   Principle Diagnosis:   Metastatic breast cancer-ER positive/HER-2 negative --bone metastasis only  Current Therapy:    Faslodex 500 mg IM monthly --start on 04/2020  Ibrance 100 mg p.o. daily (21 days on/7 days off)  Xgeva 120 mg subcu every 3 months -next dose 08/2020     Interim History:  Ms. Newmann is in for follow-up.  So far, everything is going pretty well with her.  She is doing well on the Ibrance and the Faslodex.  She is on the week off of the [place].  She has had no problems with nausea or vomiting.  She does have some hot flashes.  There is a few sweats.  I suspect this is probably from estrogen deprivation.  She is off anything for appetite.  She says she is eating pretty well right now.  She is having no problems with nausea or vomiting.  Her pain seems to be doing well.  This is well controlled.  She is on OxyContin but she is tolerating this pretty well.  She is not taking any oxycodone.  She has had no diarrhea.  There is been no constipation.  She has had no leg swelling.  Patient has had some pain over on the right chest wall.  This might be referred pain, from a spinal lesion.  She has had no issues with vision.  She does have a few headaches.  Overall, I would say her performance status is ECOG 1.  .   Medications:  Current Outpatient Medications:  .  acetaminophen (TYLENOL) 650 MG CR tablet, Take 650 mg by mouth every 8 (eight) hours as needed for pain., Disp: , Rfl:  .  CALCIUM PO, Take 1 tablet by mouth daily., Disp: , Rfl:  .  famciclovir (FAMVIR) 250 MG tablet, Take 1 tablet (250 mg total) by mouth daily., Disp: 30 tablet, Rfl: 6 .  FLUoxetine (PROZAC) 20 MG capsule, Take 3 capsules (60 mg total) by mouth daily., Disp: 90 capsule, Rfl: 11 .  IBRANCE 100 MG tablet, Take 100 mg by mouth See admin instructions. 12m daily on day 1-21 of cycle. 7 days off.   Repeat., Disp: , Rfl:  .  omeprazole (PRILOSEC) 20 MG capsule, Take 20 mg by mouth daily., Disp: , Rfl:  .  oxyCODONE (OXYCONTIN) 15 mg 12 hr tablet, Take 1 tablet (15 mg total) by mouth every 12 (twelve) hours., Disp: 60 tablet, Rfl: 0 .  polyethylene glycol (MIRALAX / GLYCOLAX) 17 g packet, Take 17 g by mouth daily as needed., Disp: 28 each, Rfl: 0 .  SM SENNA LAXATIVE 8.6 MG tablet, Take 8.6 mg by mouth daily., Disp: , Rfl:  .  sucralfate (CARAFATE) 1 GM/10ML suspension, Take 10 mLs (1 g total) by mouth 4 (four) times daily -  with meals and at bedtime., Disp: 420 mL, Rfl: 6 .  vitamin B-12 (CYANOCOBALAMIN) 1000 MCG tablet, Take 1 tablet (1,000 mcg total) by mouth daily., Disp: 30 tablet, Rfl: 1 .  hydrOXYzine (ATARAX/VISTARIL) 25 MG tablet, Take 25 mg by mouth 3 (three) times daily as needed for anxiety. (Patient not taking: Reported on 06/11/2020), Disp: , Rfl:  .  ondansetron (ZOFRAN) 4 MG tablet, Take 1 tablet (4 mg total) by mouth every 4 (four) hours as needed. (Patient not taking: Reported on 06/11/2020), Disp: 20 tablet, Rfl: 0 .  oxyCODONE (OXY IR/ROXICODONE) 5 MG immediate release tablet, Take 5 mg  by mouth every 4 (four) hours as needed for pain. (Patient not taking: Reported on 06/11/2020), Disp: , Rfl:   Allergies:  Allergies  Allergen Reactions  . Amoxicillin-Pot Clavulanate Hives  . Codeine Nausea And Vomiting    Needs pre-meds Needs pre-meds    Past Medical History, Surgical history, Social history, and Family History were reviewed and updated.  Review of Systems: Review of Systems  Constitutional: Positive for appetite change.  HENT:  Negative.   Eyes: Negative.   Respiratory: Negative.   Cardiovascular: Negative.   Gastrointestinal: Positive for abdominal pain and nausea.  Endocrine: Negative.   Genitourinary: Negative.    Musculoskeletal: Positive for arthralgias and back pain.  Skin: Negative.   Neurological: Positive for dizziness.  Hematological: Negative.    Psychiatric/Behavioral: Negative.     Physical Exam:  height is 5' 8.5" (1.74 m) and weight is 156 lb 1.3 oz (70.8 kg). Her oral temperature is 97.6 F (36.4 C). Her blood pressure is 110/77 and her pulse is 93. Her respiration is 20 and oxygen saturation is 100%.   Wt Readings from Last 3 Encounters:  06/11/20 156 lb 1.3 oz (70.8 kg)  05/14/20 154 lb (69.9 kg)  04/07/20 156 lb (70.8 kg)    Physical Exam Vitals reviewed.  HENT:     Head: Normocephalic and atraumatic.  Eyes:     Pupils: Pupils are equal, round, and reactive to light.  Cardiovascular:     Rate and Rhythm: Normal rate and regular rhythm.     Heart sounds: Normal heart sounds.  Pulmonary:     Effort: Pulmonary effort is normal.     Breath sounds: Normal breath sounds.  Abdominal:     General: Bowel sounds are normal.     Palpations: Abdomen is soft.  Musculoskeletal:        General: No tenderness or deformity. Normal range of motion.     Cervical back: Normal range of motion.  Lymphadenopathy:     Cervical: No cervical adenopathy.  Skin:    General: Skin is warm and dry.     Findings: No erythema or rash.  Neurological:     Mental Status: She is alert and oriented to person, place, and time.  Psychiatric:        Behavior: Behavior normal.        Thought Content: Thought content normal.        Judgment: Judgment normal.      Lab Results  Component Value Date   WBC 2.6 (L) 06/11/2020   HGB 12.2 06/11/2020   HCT 34.3 (L) 06/11/2020   MCV 105.2 (H) 06/11/2020   PLT 150 06/11/2020     Chemistry      Component Value Date/Time   NA 141 06/11/2020 1100   K 3.4 (L) 06/11/2020 1100   CL 105 06/11/2020 1100   CO2 30 06/11/2020 1100   BUN 12 06/11/2020 1100   CREATININE 0.85 06/11/2020 1100   CREATININE 0.88 10/22/2019 1246      Component Value Date/Time   CALCIUM 9.0 06/11/2020 1100   ALKPHOS 45 06/11/2020 1100   AST 11 (L) 06/11/2020 1100   ALT 10 06/11/2020 1100   BILITOT 0.7 06/11/2020 1100       Impression and Plan: Ms. Bougher is a a very charming 55 year old postmenopausal female with metastatic breast cancer.  We actually had seen her many years ago with a carcinoma in situ.  She is the wife of one of our former patients who passed away  from ocular melanoma.  She now has metastatic breast cancer.  This appears to be confined to her bones.  I would like to think that the Faslodex/Ibrance combination would be helpful.  It is still little bit too early to know how helpful things will be.  We will definitely check her CA 27.29 when we see her back.  I am glad that she is eating without the use of a appetite stimulant.  I am also happy that she is little more active.  She is not able to work.  I still think she is total disability because of all of her bony metastasis.  I will plan to see her back in another 4 weeks.     [name], MD 2/3/202211:46 AM

## 2020-06-11 NOTE — Patient Instructions (Signed)
Fulvestrant injection What is this medicine? FULVESTRANT (ful VES trant) blocks the effects of estrogen. It is used to treat breast cancer. This medicine may be used for other purposes; ask your health care provider or pharmacist if you have questions. COMMON BRAND NAME(S): FASLODEX What should I tell my health care provider before I take this medicine? They need to know if you have any of these conditions:  bleeding disorders  liver disease  low blood counts, like low white cell, platelet, or red cell counts  an unusual or allergic reaction to fulvestrant, other medicines, foods, dyes, or preservatives  pregnant or trying to get pregnant  breast-feeding How should I use this medicine? This medicine is for injection into a muscle. It is usually given by a health care professional in a hospital or clinic setting. Talk to your pediatrician regarding the use of this medicine in children. Special care may be needed. Overdosage: If you think you have taken too much of this medicine contact a poison control center or emergency room at once. NOTE: This medicine is only for you. Do not share this medicine with others. What if I miss a dose? It is important not to miss your dose. Call your doctor or health care professional if you are unable to keep an appointment. What may interact with this medicine?  medicines that treat or prevent blood clots like warfarin, enoxaparin, dalteparin, apixaban, dabigatran, and rivaroxaban This list may not describe all possible interactions. Give your health care provider a list of all the medicines, herbs, non-prescription drugs, or dietary supplements you use. Also tell them if you smoke, drink alcohol, or use illegal drugs. Some items may interact with your medicine. What should I watch for while using this medicine? Your condition will be monitored carefully while you are receiving this medicine. You will need important blood work done while you are taking  this medicine. Do not become pregnant while taking this medicine or for at least 1 year after stopping it. Women of child-bearing potential will need to have a negative pregnancy test before starting this medicine. Women should inform their doctor if they wish to become pregnant or think they might be pregnant. There is a potential for serious side effects to an unborn child. Men should inform their doctors if they wish to father a child. This medicine may lower sperm counts. Talk to your health care professional or pharmacist for more information. Do not breast-feed an infant while taking this medicine or for 1 year after the last dose. What side effects may I notice from receiving this medicine? Side effects that you should report to your doctor or health care professional as soon as possible:  allergic reactions like skin rash, itching or hives, swelling of the face, lips, or tongue  feeling faint or lightheaded, falls  pain, tingling, numbness, or weakness in the legs  signs and symptoms of infection like fever or chills; cough; flu-like symptoms; sore throat  vaginal bleeding Side effects that usually do not require medical attention (report to your doctor or health care professional if they continue or are bothersome):  aches, pains  constipation  diarrhea  headache  hot flashes  nausea, vomiting  pain at site where injected  stomach pain This list may not describe all possible side effects. Call your doctor for medical advice about side effects. You may report side effects to FDA at 1-800-FDA-1088. Where should I keep my medicine? This drug is given in a hospital or clinic and will   not be stored at home. NOTE: This sheet is a summary. It may not cover all possible information. If you have questions about this medicine, talk to your doctor, pharmacist, or health care provider.  2021 Elsevier/Gold Standard (2017-08-03 11:34:41)  

## 2020-06-11 NOTE — Telephone Encounter (Signed)
appts made per 06/11/20 los and pt to gain printout-view on [employer]

## 2020-06-12 LAB — CANCER ANTIGEN 27.29: CA 27.29: 61.4 U/mL — ABNORMAL HIGH (ref 0.0–38.6)

## 2020-06-24 ENCOUNTER — Other Ambulatory Visit: Payer: Self-pay | Admitting: Family

## 2020-06-29 ENCOUNTER — Other Ambulatory Visit: Payer: Self-pay | Admitting: *Deleted

## 2020-06-29 MED ORDER — OXYCODONE HCL ER 15 MG PO T12A: 15.0000 mg | EXTENDED_RELEASE_TABLET | Freq: Two times a day (BID) | ORAL | 0 refills | Status: DC

## 2020-07-06 DIAGNOSIS — S32000A Wedge compression fracture of unspecified lumbar vertebra, initial encounter for closed fracture: Secondary | ICD-10-CM | POA: Diagnosis not present

## 2020-07-06 DIAGNOSIS — M545 Low back pain, unspecified: Secondary | ICD-10-CM | POA: Diagnosis not present

## 2020-07-09 ENCOUNTER — Inpatient Hospital Stay: Payer: BC Managed Care – PPO | Attending: Hematology & Oncology

## 2020-07-09 ENCOUNTER — Other Ambulatory Visit: Payer: Self-pay

## 2020-07-09 ENCOUNTER — Inpatient Hospital Stay: Payer: BC Managed Care – PPO | Admitting: Hematology & Oncology

## 2020-07-09 ENCOUNTER — Encounter: Payer: Self-pay | Admitting: Hematology & Oncology

## 2020-07-09 ENCOUNTER — Inpatient Hospital Stay: Payer: BC Managed Care – PPO

## 2020-07-09 VITALS — BP 112/78 | HR 97 | Temp 98.3°F | Resp 20 | Wt 155.0 lb

## 2020-07-09 DIAGNOSIS — C50919 Malignant neoplasm of unspecified site of unspecified female breast: Secondary | ICD-10-CM

## 2020-07-09 DIAGNOSIS — C7951 Secondary malignant neoplasm of bone: Secondary | ICD-10-CM | POA: Insufficient documentation

## 2020-07-09 DIAGNOSIS — Z79899 Other long term (current) drug therapy: Secondary | ICD-10-CM | POA: Diagnosis not present

## 2020-07-09 DIAGNOSIS — Z79818 Long term (current) use of other agents affecting estrogen receptors and estrogen levels: Secondary | ICD-10-CM | POA: Diagnosis not present

## 2020-07-09 DIAGNOSIS — C50911 Malignant neoplasm of unspecified site of right female breast: Secondary | ICD-10-CM | POA: Diagnosis not present

## 2020-07-09 DIAGNOSIS — Z170 Estrogen receptor positive status [ER+]: Secondary | ICD-10-CM | POA: Insufficient documentation

## 2020-07-09 LAB — CMP (CANCER CENTER ONLY)
ALT: 9 U/L (ref 0–44)
AST: 11 U/L — ABNORMAL LOW (ref 15–41)
Albumin: 4.6 g/dL (ref 3.5–5.0)
Alkaline Phosphatase: 43 U/L (ref 38–126)
Anion gap: 7 (ref 5–15)
BUN: 13 mg/dL (ref 6–20)
CO2: 29 mmol/L (ref 22–32)
Calcium: 9.7 mg/dL (ref 8.9–10.3)
Chloride: 103 mmol/L (ref 98–111)
Creatinine: 0.93 mg/dL (ref 0.44–1.00)
GFR, Estimated: >60 mL/min (ref >60)
Glucose, Bld: 105 mg/dL — ABNORMAL HIGH (ref 70–99)
Potassium: 3.8 mmol/L (ref 3.5–5.1)
Sodium: 139 mmol/L (ref 135–145)
Total Bilirubin: 0.7 mg/dL (ref 0.3–1.2)
Total Protein: 7.2 g/dL (ref 6.5–8.1)

## 2020-07-09 LAB — CBC WITH DIFFERENTIAL (CANCER CENTER ONLY)
Abs Immature Granulocytes: 0 10*3/uL (ref 0.00–0.07)
Basophils Absolute: 0.1 10*3/uL (ref 0.0–0.1)
Basophils Relative: 2 %
Eosinophils Absolute: 0 10*3/uL (ref 0.0–0.5)
Eosinophils Relative: 1 %
HCT: 36 % (ref 36.0–46.0)
Hemoglobin: 12.6 g/dL (ref 12.0–15.0)
Immature Granulocytes: 0 %
Lymphocytes Relative: 37 %
Lymphs Abs: 0.9 10*3/uL (ref 0.7–4.0)
MCH: 37.4 pg — ABNORMAL HIGH (ref 26.0–34.0)
MCHC: 35 g/dL (ref 30.0–36.0)
MCV: 106.8 fL — ABNORMAL HIGH (ref 80.0–100.0)
Monocytes Absolute: 0.1 10*3/uL (ref 0.1–1.0)
Monocytes Relative: 6 %
Neutro Abs: 1.3 10*3/uL — ABNORMAL LOW (ref 1.7–7.7)
Neutrophils Relative %: 54 %
Platelet Count: 122 10*3/uL — ABNORMAL LOW (ref 150–400)
RBC: 3.37 MIL/uL — ABNORMAL LOW (ref 3.87–5.11)
RDW: 14.3 % (ref 11.5–15.5)
WBC Count: 2.3 10*3/uL — ABNORMAL LOW (ref 4.0–10.5)
nRBC: 0 % (ref 0.0–0.2)

## 2020-07-09 LAB — LACTATE DEHYDROGENASE: LDH: 157 U/L (ref 98–192)

## 2020-07-09 MED ORDER — FULVESTRANT 250 MG/5ML IM SOLN
INTRAMUSCULAR | Status: AC
  Filled 2020-07-09: qty 10

## 2020-07-09 MED ORDER — FULVESTRANT 250 MG/5ML IM SOLN
500.0000 mg | Freq: Once | INTRAMUSCULAR | Status: AC
  Administered 2020-07-09: 500 mg via INTRAMUSCULAR

## 2020-07-09 NOTE — Patient Instructions (Signed)
Fulvestrant injection What is this medicine? FULVESTRANT (ful VES trant) blocks the effects of estrogen. It is used to treat breast cancer. This medicine may be used for other purposes; ask your health care provider or pharmacist if you have questions. COMMON BRAND NAME(S): FASLODEX What should I tell my health care provider before I take this medicine? They need to know if you have any of these conditions:  bleeding disorders  liver disease  low blood counts, like low white cell, platelet, or red cell counts  an unusual or allergic reaction to fulvestrant, other medicines, foods, dyes, or preservatives  pregnant or trying to get pregnant  breast-feeding How should I use this medicine? This medicine is for injection into a muscle. It is usually given by a health care professional in a hospital or clinic setting. Talk to your pediatrician regarding the use of this medicine in children. Special care may be needed. Overdosage: If you think you have taken too much of this medicine contact a poison control center or emergency room at once. NOTE: This medicine is only for you. Do not share this medicine with others. What if I miss a dose? It is important not to miss your dose. Call your doctor or health care professional if you are unable to keep an appointment. What may interact with this medicine?  medicines that treat or prevent blood clots like warfarin, enoxaparin, dalteparin, apixaban, dabigatran, and rivaroxaban This list may not describe all possible interactions. Give your health care provider a list of all the medicines, herbs, non-prescription drugs, or dietary supplements you use. Also tell them if you smoke, drink alcohol, or use illegal drugs. Some items may interact with your medicine. What should I watch for while using this medicine? Your condition will be monitored carefully while you are receiving this medicine. You will need important blood work done while you are taking  this medicine. Do not become pregnant while taking this medicine or for at least 1 year after stopping it. Women of child-bearing potential will need to have a negative pregnancy test before starting this medicine. Women should inform their doctor if they wish to become pregnant or think they might be pregnant. There is a potential for serious side effects to an unborn child. Men should inform their doctors if they wish to father a child. This medicine may lower sperm counts. Talk to your health care professional or pharmacist for more information. Do not breast-feed an infant while taking this medicine or for 1 year after the last dose. What side effects may I notice from receiving this medicine? Side effects that you should report to your doctor or health care professional as soon as possible:  allergic reactions like skin rash, itching or hives, swelling of the face, lips, or tongue  feeling faint or lightheaded, falls  pain, tingling, numbness, or weakness in the legs  signs and symptoms of infection like fever or chills; cough; flu-like symptoms; sore throat  vaginal bleeding Side effects that usually do not require medical attention (report to your doctor or health care professional if they continue or are bothersome):  aches, pains  constipation  diarrhea  headache  hot flashes  nausea, vomiting  pain at site where injected  stomach pain This list may not describe all possible side effects. Call your doctor for medical advice about side effects. You may report side effects to FDA at 1-800-FDA-1088. Where should I keep my medicine? This drug is given in a hospital or clinic and will   not be stored at home. NOTE: This sheet is a summary. It may not cover all possible information. If you have questions about this medicine, talk to your doctor, pharmacist, or health care provider.  2021 Elsevier/Gold Standard (2017-08-03 11:34:41)  

## 2020-07-09 NOTE — Progress Notes (Signed)
Hematology and Oncology Follow Up Visit  Caitlin Greene [id number] 11/03/1965 55 y.o. 07/09/2020   Principle Diagnosis:   Metastatic breast cancer-ER positive/HER-2 negative --bone metastasis only  Current Therapy:    Faslodex 500 mg IM monthly --start on 04/2020  Ibrance 100 mg p.o. daily (21d on/7d off) - start on 04/2020  Xgeva 120 mg subcu every 3 months -next dose 08/2020     Interim History:  Ms. Caitlin Greene is in for follow-up.  She does look better.  She keeps getting better every time we saw her.  I am just happy that her quality of life is doing well.  Her pain seems to be under fairly good control.  She is more active.  Her last CA 27.29 was stable at 61..  This certainly is encouraging.  We probably will need to set her up with some scans in a month.  Her last scans that really were staging scans were done at [facility] in October.  She is eating better.  I think she is holding her weight stable.  She has not had any problems with nausea or vomiting.  There is been no diarrhea.  She is not too bothered by hot flashes.  She has these on occasion.  I would have to say that currently, her performance status is ECOG 1.    Medications:  Current Outpatient Medications:  .  acetaminophen (TYLENOL) 650 MG CR tablet, Take 650 mg by mouth every 8 (eight) hours as needed for pain., Disp: , Rfl:  .  CALCIUM PO, Take 1 tablet by mouth daily., Disp: , Rfl:  .  famciclovir (FAMVIR) 250 MG tablet, Take 1 tablet (250 mg total) by mouth daily., Disp: 30 tablet, Rfl: 6 .  FLUoxetine (PROZAC) 20 MG capsule, Take 3 capsules (60 mg total) by mouth daily., Disp: 90 capsule, Rfl: 11 .  IBRANCE 100 MG tablet, Take 100 mg by mouth See admin instructions. 12m daily on day 1-21 of cycle. 7 days off.  Repeat., Disp: , Rfl:  .  omeprazole (PRILOSEC) 20 MG capsule, Take 20 mg by mouth daily., Disp: , Rfl:  .  oxyCODONE (OXYCONTIN) 15 mg 12 hr tablet, Take 1 tablet (15 mg total) by mouth every 12 (twelve)  hours., Disp: 60 tablet, Rfl: 0 .  polyethylene glycol (MIRALAX / GLYCOLAX) 17 g packet, Take 17 g by mouth daily as needed., Disp: 28 each, Rfl: 0 .  SM SENNA LAXATIVE 8.6 MG tablet, Take 8.6 mg by mouth daily., Disp: , Rfl:  .  sucralfate (CARAFATE) 1 GM/10ML suspension, Take 10 mLs (1 g total) by mouth 4 (four) times daily -  with meals and at bedtime., Disp: 420 mL, Rfl: 6 .  vitamin B-12 (CYANOCOBALAMIN) 1000 MCG tablet, Take 1 tablet (1,000 mcg total) by mouth daily., Disp: 30 tablet, Rfl: 1 .  hydrOXYzine (ATARAX/VISTARIL) 25 MG tablet, Take 25 mg by mouth 3 (three) times daily as needed for anxiety. (Patient not taking: Reported on 07/09/2020), Disp: , Rfl:  .  ondansetron (ZOFRAN) 4 MG tablet, Take 1 tablet (4 mg total) by mouth every 4 (four) hours as needed. (Patient not taking: No sig reported), Disp: 20 tablet, Rfl: 0 .  oxyCODONE (OXY IR/ROXICODONE) 5 MG immediate release tablet, Take 5 mg by mouth every 4 (four) hours as needed for pain. (Patient not taking: Reported on 07/09/2020), Disp: , Rfl:   Allergies:  Allergies  Allergen Reactions  . Amoxicillin-Pot Clavulanate Hives  . Codeine Nausea And Vomiting  Needs pre-meds Needs pre-meds    Past Medical History, Surgical history, Social history, and Family History were reviewed and updated.  Review of Systems: Review of Systems  Constitutional: Positive for appetite change.  HENT:  Negative.   Eyes: Negative.   Respiratory: Negative.   Cardiovascular: Negative.   Gastrointestinal: Positive for abdominal pain and nausea.  Endocrine: Negative.   Genitourinary: Negative.    Musculoskeletal: Positive for arthralgias and back pain.  Skin: Negative.   Neurological: Positive for dizziness.  Hematological: Negative.   Psychiatric/Behavioral: Negative.     Physical Exam:  weight is 155 lb (70.3 kg). Her oral temperature is 98.3 F (36.8 C). Her blood pressure is 112/78 and her pulse is 97. Her respiration is 20 and oxygen  saturation is 100%.   Wt Readings from Last 3 Encounters:  07/09/20 155 lb (70.3 kg)  06/11/20 156 lb 1.3 oz (70.8 kg)  05/14/20 154 lb (69.9 kg)    Physical Exam Vitals reviewed.  HENT:     Head: Normocephalic and atraumatic.  Eyes:     Pupils: Pupils are equal, round, and reactive to light.  Cardiovascular:     Rate and Rhythm: Normal rate and regular rhythm.     Heart sounds: Normal heart sounds.  Pulmonary:     Effort: Pulmonary effort is normal.     Breath sounds: Normal breath sounds.  Abdominal:     General: Bowel sounds are normal.     Palpations: Abdomen is soft.  Musculoskeletal:        General: No tenderness or deformity. Normal range of motion.     Cervical back: Normal range of motion.  Lymphadenopathy:     Cervical: No cervical adenopathy.  Skin:    General: Skin is warm and dry.     Findings: No erythema or rash.  Neurological:     Mental Status: She is alert and oriented to person, place, and time.  Psychiatric:        Behavior: Behavior normal.        Thought Content: Thought content normal.        Judgment: Judgment normal.      Lab Results  Component Value Date   WBC 2.3 (L) 07/09/2020   HGB 12.6 07/09/2020   HCT 36.0 07/09/2020   MCV 106.8 (H) 07/09/2020   PLT 122 (L) 07/09/2020     Chemistry      Component Value Date/Time   NA 139 07/09/2020 1258   K 3.8 07/09/2020 1258   CL 103 07/09/2020 1258   CO2 29 07/09/2020 1258   BUN 13 07/09/2020 1258   CREATININE 0.93 07/09/2020 1258   CREATININE 0.88 10/22/2019 1246      Component Value Date/Time   CALCIUM 9.7 07/09/2020 1258   ALKPHOS 43 07/09/2020 1258   AST 11 (L) 07/09/2020 1258   ALT 9 07/09/2020 1258   BILITOT 0.7 07/09/2020 1258      Impression and Plan: Ms. Caitlin Greene is a a very charming 55 year old postmenopausal female with metastatic breast cancer.  We actually had seen her many years ago with a carcinoma in situ.  She is the wife of one of our former patients who passed  away from ocular melanoma.  She now has metastatic breast cancer.  This appears to be confined to her bones.  I would like to think that the Faslodex/Ibrance combination would be helpful.  We will see what the CA 27.29 is.  Hopefully, it will be lower.  Ultimately, the bone  scan and CT scan will tell us how things are going.  I hope that the radiologist will be able to look at her scans done at [facility].  I will plan to see her back in 1 more month.   [name], MD 3/3/20221:36 PM

## 2020-07-10 LAB — CANCER ANTIGEN 27.29: CA 27.29: 60 U/mL — ABNORMAL HIGH (ref 0.0–38.6)

## 2020-07-17 DIAGNOSIS — M546 Pain in thoracic spine: Secondary | ICD-10-CM | POA: Diagnosis not present

## 2020-07-17 DIAGNOSIS — M545 Low back pain, unspecified: Secondary | ICD-10-CM | POA: Diagnosis not present

## 2020-07-22 ENCOUNTER — Other Ambulatory Visit: Payer: Self-pay | Admitting: Orthopedic Surgery

## 2020-07-22 DIAGNOSIS — M546 Pain in thoracic spine: Secondary | ICD-10-CM

## 2020-07-22 DIAGNOSIS — M545 Low back pain, unspecified: Secondary | ICD-10-CM

## 2020-07-29 ENCOUNTER — Other Ambulatory Visit: Payer: Self-pay | Admitting: *Deleted

## 2020-07-29 MED ORDER — OXYCODONE HCL ER 15 MG PO T12A: 15.0000 mg | EXTENDED_RELEASE_TABLET | Freq: Two times a day (BID) | ORAL | 0 refills | Status: DC

## 2020-08-04 ENCOUNTER — Encounter (HOSPITAL_COMMUNITY): Payer: Self-pay

## 2020-08-04 ENCOUNTER — Encounter (HOSPITAL_COMMUNITY)
Admission: RE | Admit: 2020-08-04 | Discharge: 2020-08-04 | Disposition: A | Discharge status: Home / Self Care | Payer: BC Managed Care – PPO | Source: Ambulatory Visit | Attending: Hematology & Oncology | Admitting: Hematology & Oncology

## 2020-08-04 ENCOUNTER — Other Ambulatory Visit: Payer: Self-pay

## 2020-08-04 ENCOUNTER — Ambulatory Visit (HOSPITAL_COMMUNITY)
Admission: RE | Admit: 2020-08-04 | Discharge: 2020-08-04 | Disposition: A | Discharge status: Home / Self Care | Payer: BC Managed Care – PPO | Source: Ambulatory Visit | Attending: Hematology & Oncology | Admitting: Hematology & Oncology

## 2020-08-04 DIAGNOSIS — N3289 Other specified disorders of bladder: Secondary | ICD-10-CM | POA: Diagnosis not present

## 2020-08-04 DIAGNOSIS — C50919 Malignant neoplasm of unspecified site of unspecified female breast: Secondary | ICD-10-CM

## 2020-08-04 DIAGNOSIS — J9811 Atelectasis: Secondary | ICD-10-CM | POA: Diagnosis not present

## 2020-08-04 DIAGNOSIS — J984 Other disorders of lung: Secondary | ICD-10-CM | POA: Diagnosis not present

## 2020-08-04 DIAGNOSIS — K5730 Diverticulosis of large intestine without perforation or abscess without bleeding: Secondary | ICD-10-CM | POA: Diagnosis not present

## 2020-08-04 DIAGNOSIS — E278 Other specified disorders of adrenal gland: Secondary | ICD-10-CM | POA: Diagnosis not present

## 2020-08-04 DIAGNOSIS — K449 Diaphragmatic hernia without obstruction or gangrene: Secondary | ICD-10-CM | POA: Diagnosis not present

## 2020-08-04 HISTORY — DX: Secondary malignant neoplasm of bone: C79.51

## 2020-08-04 MED ORDER — TECHNETIUM TC 99M MEDRONATE IV KIT
20.0000 | PACK | Freq: Once | INTRAVENOUS | Status: AC | PRN
  Administered 2020-08-04: 21.3 via INTRAVENOUS

## 2020-08-04 MED ORDER — IOHEXOL 300 MG/ML  SOLN
100.0000 mL | Freq: Once | INTRAMUSCULAR | Status: AC | PRN
  Administered 2020-08-04: 100 mL via INTRAVENOUS

## 2020-08-05 ENCOUNTER — Encounter: Payer: Self-pay | Admitting: *Deleted

## 2020-08-05 DIAGNOSIS — S32000A Wedge compression fracture of unspecified lumbar vertebra, initial encounter for closed fracture: Secondary | ICD-10-CM | POA: Diagnosis not present

## 2020-08-05 DIAGNOSIS — M545 Low back pain, unspecified: Secondary | ICD-10-CM | POA: Diagnosis not present

## 2020-08-06 ENCOUNTER — Other Ambulatory Visit: Payer: Self-pay

## 2020-08-06 ENCOUNTER — Inpatient Hospital Stay (HOSPITAL_BASED_OUTPATIENT_CLINIC_OR_DEPARTMENT_OTHER): Payer: BC Managed Care – PPO | Admitting: Hematology & Oncology

## 2020-08-06 ENCOUNTER — Encounter: Payer: Self-pay | Admitting: Hematology & Oncology

## 2020-08-06 ENCOUNTER — Inpatient Hospital Stay: Payer: BC Managed Care – PPO

## 2020-08-06 VITALS — BP 109/75 | HR 94 | Temp 98.9°F | Resp 18 | Wt 156.0 lb

## 2020-08-06 DIAGNOSIS — C50919 Malignant neoplasm of unspecified site of unspecified female breast: Secondary | ICD-10-CM

## 2020-08-06 DIAGNOSIS — Z170 Estrogen receptor positive status [ER+]: Secondary | ICD-10-CM | POA: Diagnosis not present

## 2020-08-06 DIAGNOSIS — Z79899 Other long term (current) drug therapy: Secondary | ICD-10-CM | POA: Diagnosis not present

## 2020-08-06 DIAGNOSIS — C7951 Secondary malignant neoplasm of bone: Secondary | ICD-10-CM | POA: Diagnosis not present

## 2020-08-06 DIAGNOSIS — C50911 Malignant neoplasm of unspecified site of right female breast: Secondary | ICD-10-CM | POA: Diagnosis not present

## 2020-08-06 DIAGNOSIS — Z79818 Long term (current) use of other agents affecting estrogen receptors and estrogen levels: Secondary | ICD-10-CM | POA: Diagnosis not present

## 2020-08-06 LAB — CBC WITH DIFFERENTIAL (CANCER CENTER ONLY)
Abs Immature Granulocytes: 0.01 10*3/uL (ref 0.00–0.07)
Basophils Absolute: 0 10*3/uL (ref 0.0–0.1)
Basophils Relative: 1 %
Eosinophils Absolute: 0.1 10*3/uL (ref 0.0–0.5)
Eosinophils Relative: 2 %
HCT: 36.5 % (ref 36.0–46.0)
Hemoglobin: 12.6 g/dL (ref 12.0–15.0)
Immature Granulocytes: 0 %
Lymphocytes Relative: 39 %
Lymphs Abs: 1.2 10*3/uL (ref 0.7–4.0)
MCH: 36.5 pg — ABNORMAL HIGH (ref 26.0–34.0)
MCHC: 34.5 g/dL (ref 30.0–36.0)
MCV: 105.8 fL — ABNORMAL HIGH (ref 80.0–100.0)
Monocytes Absolute: 0.2 10*3/uL (ref 0.1–1.0)
Monocytes Relative: 5 %
Neutro Abs: 1.6 10*3/uL — ABNORMAL LOW (ref 1.7–7.7)
Neutrophils Relative %: 53 %
Platelet Count: 232 10*3/uL (ref 150–400)
RBC: 3.45 MIL/uL — ABNORMAL LOW (ref 3.87–5.11)
RDW: 12.3 % (ref 11.5–15.5)
WBC Count: 3.1 10*3/uL — ABNORMAL LOW (ref 4.0–10.5)
nRBC: 0 % (ref 0.0–0.2)

## 2020-08-06 LAB — CMP (CANCER CENTER ONLY)
ALT: 13 U/L (ref 0–44)
AST: 14 U/L — ABNORMAL LOW (ref 15–41)
Albumin: 4.3 g/dL (ref 3.5–5.0)
Alkaline Phosphatase: 50 U/L (ref 38–126)
Anion gap: 5 (ref 5–15)
BUN: 14 mg/dL (ref 6–20)
CO2: 30 mmol/L (ref 22–32)
Calcium: 9 mg/dL (ref 8.9–10.3)
Chloride: 102 mmol/L (ref 98–111)
Creatinine: 0.95 mg/dL (ref 0.44–1.00)
GFR, Estimated: >60 mL/min (ref >60)
Glucose, Bld: 98 mg/dL (ref 70–99)
Potassium: 3.9 mmol/L (ref 3.5–5.1)
Sodium: 137 mmol/L (ref 135–145)
Total Bilirubin: 0.7 mg/dL (ref 0.3–1.2)
Total Protein: 7.1 g/dL (ref 6.5–8.1)

## 2020-08-06 LAB — LACTATE DEHYDROGENASE: LDH: 167 U/L (ref 98–192)

## 2020-08-06 MED ORDER — SULFAMETHOXAZOLE-TRIMETHOPRIM 800-160 MG PO TABS: 1.0000 | ORAL_TABLET | Freq: Two times a day (BID) | ORAL | 0 refills | Status: DC

## 2020-08-06 MED ORDER — FULVESTRANT 250 MG/5ML IM SOLN
INTRAMUSCULAR | Status: AC
  Filled 2020-08-06: qty 10

## 2020-08-06 MED ORDER — FULVESTRANT 250 MG/5ML IM SOLN
500.0000 mg | Freq: Once | INTRAMUSCULAR | Status: AC
Start: 2020-08-06 — End: 2020-08-06
  Administered 2020-08-06: 500 mg via INTRAMUSCULAR

## 2020-08-06 NOTE — Progress Notes (Signed)
Hematology and Oncology Follow Up Visit  Caitlin Greene [id number] Feb 29, 1966 55 y.o. 08/06/2020   Principle Diagnosis:   Metastatic breast cancer-ER positive/HER-2 negative --bone metastasis only  Current Therapy:    Faslodex 500 mg IM monthly --start on 04/2020  Ibrance 100 mg p.o. daily (21d on/7d off) - start on 04/2020  Xgeva 120 mg subcu every 3 months -next dose 08/2020     Interim History:  Caitlin Greene is in for follow-up. She actually doing quite well.  Since we last saw her, she has been quite busy.  She actually has gone to lunch tomorrow with a friend.  She really has not had a lot of problems.  We did go ahead and repeat a CT scan and a bone scan on her.  Thankfully, everything looks quite stable.  The bone scan shows that there is activity might be a little bit less.  There are no new areas of activity.  Her last CA 27.29 was holding pretty steady at 60.  She is doing well with the Faslodex and the Ibrance.  She has had no problems with cough or shortness of breath.  She does have a little bit of a sinus infection.  We will put her on Bactrim for this.  She has had no change in bowel or bladder habits.  Her pain control seems to be doing fairly well.  We are trying to be aggressive with her pain control so she can be functional.  She has had no bleeding.  There is been no leg swelling.  She has had no issues with headache.  She is going go for an MRI of the thoracic spine this weekend.  She is being seen by [employer].  Currently, her performance status is ECOG 1.    Medications:  Current Outpatient Medications:  .  sulfamethoxazole-trimethoprim (BACTRIM DS) 800-160 MG tablet, Take 1 tablet by mouth 2 (two) times daily., Disp: 14 tablet, Rfl: 0 .  acetaminophen (TYLENOL) 650 MG CR tablet, Take 650 mg by mouth every 8 (eight) hours as needed for pain., Disp: , Rfl:  .  CALCIUM PO, Take 1 tablet by mouth daily., Disp: , Rfl:  .  diazepam (VALIUM) 5 MG  tablet, Take 5 mg by mouth daily as needed., Disp: , Rfl:  .  famciclovir (FAMVIR) 250 MG tablet, Take 1 tablet (250 mg total) by mouth daily., Disp: 30 tablet, Rfl: 6 .  FLUoxetine (PROZAC) 20 MG capsule, Take 3 capsules (60 mg total) by mouth daily., Disp: 90 capsule, Rfl: 11 .  hydrOXYzine (ATARAX/VISTARIL) 25 MG tablet, Take 25 mg by mouth 3 (three) times daily as needed for anxiety. (Patient not taking: Reported on 07/09/2020), Disp: , Rfl:  .  IBRANCE 100 MG tablet, Take 100 mg by mouth See admin instructions. 100mg  daily on day 1-21 of cycle. 7 days off.  Repeat., Disp: , Rfl:  .  methocarbamol (ROBAXIN) 500 MG tablet, Take 500 mg by mouth daily., Disp: , Rfl:  .  omeprazole (PRILOSEC) 20 MG capsule, Take 20 mg by mouth daily., Disp: , Rfl:  .  ondansetron (ZOFRAN) 4 MG tablet, Take 1 tablet (4 mg total) by mouth every 4 (four) hours as needed. (Patient not taking: No sig reported), Disp: 20 tablet, Rfl: 0 .  oxyCODONE (OXY IR/ROXICODONE) 5 MG immediate release tablet, Take 5 mg by mouth every 4 (four) hours as needed for pain. (Patient not taking: Reported on 07/09/2020), Disp: , Rfl:  .  oxyCODONE (OXYCONTIN) 15 mg  12 hr tablet, Take 1 tablet (15 mg total) by mouth every 12 (twelve) hours., Disp: 60 tablet, Rfl: 0 .  polyethylene glycol (MIRALAX / GLYCOLAX) 17 g packet, Take 17 g by mouth daily as needed., Disp: 28 each, Rfl: 0 .  SM SENNA LAXATIVE 8.6 MG tablet, Take 8.6 mg by mouth daily., Disp: , Rfl:  .  sucralfate (CARAFATE) 1 GM/10ML suspension, Take 10 mLs (1 g total) by mouth 4 (four) times daily -  with meals and at bedtime., Disp: 420 mL, Rfl: 6 .  vitamin B-12 (CYANOCOBALAMIN) 1000 MCG tablet, Take 1 tablet (1,000 mcg total) by mouth daily., Disp: 30 tablet, Rfl: 1 No current facility-administered medications for this visit.  Facility-Administered Medications Ordered in Other Visits:  .  fulvestrant (FASLODEX) injection 500 mg, 500 mg, Intramuscular, Once, Charne Mcbrien, Rudell Cobb,  MD  Allergies:  Allergies  Allergen Reactions  . Amoxicillin-Pot Clavulanate Hives  . Codeine Nausea And Vomiting    Needs pre-meds Needs pre-meds    Past Medical History, Surgical history, Social history, and Family History were reviewed and updated.  Review of Systems: Review of Systems  Constitutional: Positive for appetite change.  HENT:  Negative.   Eyes: Negative.   Respiratory: Negative.   Cardiovascular: Negative.   Gastrointestinal: Positive for abdominal pain and nausea.  Endocrine: Negative.   Genitourinary: Negative.    Musculoskeletal: Positive for arthralgias and back pain.  Skin: Negative.   Neurological: Positive for dizziness.  Hematological: Negative.   Psychiatric/Behavioral: Negative.     Physical Exam:  weight is 156 lb (70.8 kg). Her oral temperature is 98.9 F (37.2 C). Her blood pressure is 109/75 and her pulse is 94. Her respiration is 18 and oxygen saturation is 100%.   Wt Readings from Last 3 Encounters:  08/06/20 156 lb (70.8 kg)  07/09/20 155 lb (70.3 kg)  06/11/20 156 lb 1.3 oz (70.8 kg)    Physical Exam Vitals reviewed.  HENT:     Head: Normocephalic and atraumatic.  Eyes:     Pupils: Pupils are equal, round, and reactive to light.  Cardiovascular:     Rate and Rhythm: Normal rate and regular rhythm.     Heart sounds: Normal heart sounds.  Pulmonary:     Effort: Pulmonary effort is normal.     Breath sounds: Normal breath sounds.  Abdominal:     General: Bowel sounds are normal.     Palpations: Abdomen is soft.  Musculoskeletal:        General: No tenderness or deformity. Normal range of motion.     Cervical back: Normal range of motion.  Lymphadenopathy:     Cervical: No cervical adenopathy.  Skin:    General: Skin is warm and dry.     Findings: No erythema or rash.  Neurological:     Mental Status: She is alert and oriented to person, place, and time.  Psychiatric:        Behavior: Behavior normal.        Thought  Content: Thought content normal.        Judgment: Judgment normal.      Lab Results  Component Value Date   WBC 3.1 (L) 08/06/2020   HGB 12.6 08/06/2020   HCT 36.5 08/06/2020   MCV 105.8 (H) 08/06/2020   PLT 232 08/06/2020     Chemistry      Component Value Date/Time   NA 137 08/06/2020 1159   K 3.9 08/06/2020 1159   CL 102 08/06/2020 1159  CO2 30 08/06/2020 1159   BUN 14 08/06/2020 1159   CREATININE 0.95 08/06/2020 1159   CREATININE 0.88 10/22/2019 1246      Component Value Date/Time   CALCIUM 9.0 08/06/2020 1159   ALKPHOS 50 08/06/2020 1159   AST 14 (L) 08/06/2020 1159   ALT 13 08/06/2020 1159   BILITOT 0.7 08/06/2020 1159      Impression and Plan: Ms. Eppolito is a very charming 55 year old postmenopausal female with metastatic breast cancer.  We actually had seen her many years ago with a carcinoma in situ.  She is the wife of one of our former patients who passed away from ocular melanoma.  She now has metastatic breast cancer.  This appears to be confined to her bones.  Thankfully, the recent scans show that things were holding nice and steady.  This is really all about quality of life.  Right now, her quality of life really is doing fairly nicely.  She is able to do what she would like to do.  She does get tired but at least she is able to be functional and participate in important activities.  We will go ahead and plan to get her back in another month.  When she comes back, she will get her Delton See in addition to the Faslodex.   [name], MD 3/31/20221:04 PM

## 2020-08-06 NOTE — Patient Instructions (Signed)
Fulvestrant injection What is this medicine? FULVESTRANT (ful VES trant) blocks the effects of estrogen. It is used to treat breast cancer. This medicine may be used for other purposes; ask your health care provider or pharmacist if you have questions. COMMON BRAND NAME(S): FASLODEX What should I tell my health care provider before I take this medicine? They need to know if you have any of these conditions:  bleeding disorders  liver disease  low blood counts, like low white cell, platelet, or red cell counts  an unusual or allergic reaction to fulvestrant, other medicines, foods, dyes, or preservatives  pregnant or trying to get pregnant  breast-feeding How should I use this medicine? This medicine is for injection into a muscle. It is usually given by a health care professional in a hospital or clinic setting. Talk to your pediatrician regarding the use of this medicine in children. Special care may be needed. Overdosage: If you think you have taken too much of this medicine contact a poison control center or emergency room at once. NOTE: This medicine is only for you. Do not share this medicine with others. What if I miss a dose? It is important not to miss your dose. Call your doctor or health care professional if you are unable to keep an appointment. What may interact with this medicine?  medicines that treat or prevent blood clots like warfarin, enoxaparin, dalteparin, apixaban, dabigatran, and rivaroxaban This list may not describe all possible interactions. Give your health care provider a list of all the medicines, herbs, non-prescription drugs, or dietary supplements you use. Also tell them if you smoke, drink alcohol, or use illegal drugs. Some items may interact with your medicine. What should I watch for while using this medicine? Your condition will be monitored carefully while you are receiving this medicine. You will need important blood work done while you are taking  this medicine. Do not become pregnant while taking this medicine or for at least 1 year after stopping it. Women of child-bearing potential will need to have a negative pregnancy test before starting this medicine. Women should inform their doctor if they wish to become pregnant or think they might be pregnant. There is a potential for serious side effects to an unborn child. Men should inform their doctors if they wish to father a child. This medicine may lower sperm counts. Talk to your health care professional or pharmacist for more information. Do not breast-feed an infant while taking this medicine or for 1 year after the last dose. What side effects may I notice from receiving this medicine? Side effects that you should report to your doctor or health care professional as soon as possible:  allergic reactions like skin rash, itching or hives, swelling of the face, lips, or tongue  feeling faint or lightheaded, falls  pain, tingling, numbness, or weakness in the legs  signs and symptoms of infection like fever or chills; cough; flu-like symptoms; sore throat  vaginal bleeding Side effects that usually do not require medical attention (report to your doctor or health care professional if they continue or are bothersome):  aches, pains  constipation  diarrhea  headache  hot flashes  nausea, vomiting  pain at site where injected  stomach pain This list may not describe all possible side effects. Call your doctor for medical advice about side effects. You may report side effects to FDA at 1-800-FDA-1088. Where should I keep my medicine? This drug is given in a hospital or clinic and will   not be stored at home. NOTE: This sheet is a summary. It may not cover all possible information. If you have questions about this medicine, talk to your doctor, pharmacist, or health care provider.  2021 Elsevier/Gold Standard (2017-08-03 11:34:41)  

## 2020-08-07 LAB — CANCER ANTIGEN 27.29: CA 27.29: 54.2 U/mL — ABNORMAL HIGH (ref 0.0–38.6)

## 2020-08-09 ENCOUNTER — Other Ambulatory Visit: Payer: Self-pay

## 2020-08-09 ENCOUNTER — Ambulatory Visit
Admission: RE | Admit: 2020-08-09 | Discharge: 2020-08-09 | Disposition: A | Discharge status: Home / Self Care | Payer: BC Managed Care – PPO | Source: Ambulatory Visit | Attending: Orthopedic Surgery | Admitting: Orthopedic Surgery

## 2020-08-09 DIAGNOSIS — M545 Low back pain, unspecified: Secondary | ICD-10-CM

## 2020-08-09 DIAGNOSIS — M546 Pain in thoracic spine: Secondary | ICD-10-CM | POA: Diagnosis not present

## 2020-08-18 DIAGNOSIS — M546 Pain in thoracic spine: Secondary | ICD-10-CM | POA: Diagnosis not present

## 2020-08-31 ENCOUNTER — Other Ambulatory Visit: Payer: Self-pay | Admitting: *Deleted

## 2020-08-31 MED ORDER — OXYCODONE HCL ER 15 MG PO T12A
15.0000 mg | EXTENDED_RELEASE_TABLET | Freq: Two times a day (BID) | ORAL | 0 refills | Status: DC
Start: 2020-08-31 — End: 2020-10-01

## 2020-09-01 ENCOUNTER — Telehealth: Payer: Self-pay | Admitting: Internal Medicine

## 2020-09-01 MED ORDER — FLUOXETINE HCL 20 MG PO CAPS: 60.0000 mg | ORAL_CAPSULE | Freq: Every day | ORAL | 1 refills | Status: DC

## 2020-09-01 NOTE — Telephone Encounter (Signed)
Patient requesting refill for FLUoxetine (PROZAC) 20 MG capsule  [id number] - [place], [place] - [address] AT [address]

## 2020-09-02 DIAGNOSIS — S46911D Strain of unspecified muscle, fascia and tendon at shoulder and upper arm level, right arm, subsequent encounter: Secondary | ICD-10-CM | POA: Diagnosis not present

## 2020-09-02 DIAGNOSIS — M4805 Spinal stenosis, thoracolumbar region: Secondary | ICD-10-CM | POA: Diagnosis not present

## 2020-09-02 DIAGNOSIS — M546 Pain in thoracic spine: Secondary | ICD-10-CM | POA: Diagnosis not present

## 2020-09-03 ENCOUNTER — Inpatient Hospital Stay: Payer: BC Managed Care – PPO | Attending: Hematology & Oncology

## 2020-09-03 ENCOUNTER — Inpatient Hospital Stay: Payer: BC Managed Care – PPO

## 2020-09-03 ENCOUNTER — Other Ambulatory Visit: Payer: Self-pay

## 2020-09-03 ENCOUNTER — Inpatient Hospital Stay (HOSPITAL_BASED_OUTPATIENT_CLINIC_OR_DEPARTMENT_OTHER): Payer: BC Managed Care – PPO | Admitting: Hematology & Oncology

## 2020-09-03 ENCOUNTER — Encounter: Payer: Self-pay | Admitting: Hematology & Oncology

## 2020-09-03 VITALS — BP 117/73 | HR 78 | Temp 98.4°F | Resp 20 | Wt 159.1 lb

## 2020-09-03 DIAGNOSIS — C50911 Malignant neoplasm of unspecified site of right female breast: Secondary | ICD-10-CM | POA: Diagnosis not present

## 2020-09-03 DIAGNOSIS — C50919 Malignant neoplasm of unspecified site of unspecified female breast: Secondary | ICD-10-CM

## 2020-09-03 DIAGNOSIS — Z79818 Long term (current) use of other agents affecting estrogen receptors and estrogen levels: Secondary | ICD-10-CM | POA: Diagnosis not present

## 2020-09-03 DIAGNOSIS — Z79899 Other long term (current) drug therapy: Secondary | ICD-10-CM | POA: Insufficient documentation

## 2020-09-03 DIAGNOSIS — Z170 Estrogen receptor positive status [ER+]: Secondary | ICD-10-CM | POA: Diagnosis not present

## 2020-09-03 DIAGNOSIS — R232 Flushing: Secondary | ICD-10-CM | POA: Diagnosis not present

## 2020-09-03 DIAGNOSIS — C7951 Secondary malignant neoplasm of bone: Secondary | ICD-10-CM | POA: Insufficient documentation

## 2020-09-03 LAB — CBC WITH DIFFERENTIAL (CANCER CENTER ONLY)
Abs Immature Granulocytes: 0 10*3/uL (ref 0.00–0.07)
Basophils Absolute: 0.1 10*3/uL (ref 0.0–0.1)
Basophils Relative: 2 %
Eosinophils Absolute: 0.1 10*3/uL (ref 0.0–0.5)
Eosinophils Relative: 2 %
HCT: 34.9 % — ABNORMAL LOW (ref 36.0–46.0)
Hemoglobin: 12.2 g/dL (ref 12.0–15.0)
Immature Granulocytes: 0 %
Lymphocytes Relative: 48 %
Lymphs Abs: 1.7 10*3/uL (ref 0.7–4.0)
MCH: 36.9 pg — ABNORMAL HIGH (ref 26.0–34.0)
MCHC: 35 g/dL (ref 30.0–36.0)
MCV: 105.4 fL — ABNORMAL HIGH (ref 80.0–100.0)
Monocytes Absolute: 0.4 10*3/uL (ref 0.1–1.0)
Monocytes Relative: 11 %
Neutro Abs: 1.3 10*3/uL — ABNORMAL LOW (ref 1.7–7.7)
Neutrophils Relative %: 37 %
Platelet Count: 138 10*3/uL — ABNORMAL LOW (ref 150–400)
RBC: 3.31 MIL/uL — ABNORMAL LOW (ref 3.87–5.11)
RDW: 13.1 % (ref 11.5–15.5)
WBC Count: 3.6 10*3/uL — ABNORMAL LOW (ref 4.0–10.5)
nRBC: 0 % (ref 0.0–0.2)

## 2020-09-03 LAB — CMP (CANCER CENTER ONLY)
ALT: 12 U/L (ref 0–44)
AST: 11 U/L — ABNORMAL LOW (ref 15–41)
Albumin: 4 g/dL (ref 3.5–5.0)
Alkaline Phosphatase: 42 U/L (ref 38–126)
Anion gap: 8 (ref 5–15)
BUN: 15 mg/dL (ref 6–20)
CO2: 27 mmol/L (ref 22–32)
Calcium: 9 mg/dL (ref 8.9–10.3)
Chloride: 107 mmol/L (ref 98–111)
Creatinine: 0.9 mg/dL (ref 0.44–1.00)
GFR, Estimated: >60 mL/min (ref >60)
Glucose, Bld: 102 mg/dL — ABNORMAL HIGH (ref 70–99)
Potassium: 3.5 mmol/L (ref 3.5–5.1)
Sodium: 142 mmol/L (ref 135–145)
Total Bilirubin: 0.4 mg/dL (ref 0.3–1.2)
Total Protein: 6.3 g/dL — ABNORMAL LOW (ref 6.5–8.1)

## 2020-09-03 MED ORDER — FULVESTRANT 250 MG/5ML IM SOLN
INTRAMUSCULAR | Status: AC
  Filled 2020-09-03: qty 10

## 2020-09-03 MED ORDER — DENOSUMAB 120 MG/1.7ML ~~LOC~~ SOLN
120.0000 mg | Freq: Once | SUBCUTANEOUS | Status: AC
Start: 2020-09-03 — End: 2020-09-03
  Administered 2020-09-03: 120 mg via SUBCUTANEOUS

## 2020-09-03 MED ORDER — DENOSUMAB 120 MG/1.7ML ~~LOC~~ SOLN
SUBCUTANEOUS | Status: AC
  Filled 2020-09-03: qty 1.7

## 2020-09-03 MED ORDER — FULVESTRANT 250 MG/5ML IM SOLN
500.0000 mg | Freq: Once | INTRAMUSCULAR | Status: AC
  Administered 2020-09-03: 500 mg via INTRAMUSCULAR

## 2020-09-03 NOTE — Progress Notes (Signed)
Hematology and Oncology Follow Up Visit  Caitlin Greene [id number] June 24, 1965 55 y.o. 09/03/2020   Principle Diagnosis:   Metastatic breast cancer-ER positive/HER-2 negative --bone metastasis only  Current Therapy:    Faslodex 500 mg IM monthly --start on 04/2020  Ibrance 100 mg p.o. daily (21d on/7d off) - start on 04/2020  Xgeva 120 mg subcu every 3 months -next dose 11/2020     Interim History:  Caitlin Greene is in for follow-up.  So far, everything is going pretty well with her.  She seems to be little more active.  The pain seems to be doing a little bit better.  She has seen Dr. Lynann Bologna of orthopedic surgery.  He did go ahead and do an MRI of her thoracic spine and lumbar spine.  She does have the metastatic lesions there.  Nothing appears to be progressing.  She does have little bit of a compression fracture.  He apparently is just watching this.  She is doing physical therapy.  This seems to be helping her.  Her last CA 27.29 was 54.  This continues to go down slowly but surely.  She has had no problems with the Ibrance.  She does get tired.  She does have some hot flashes.  This certainly could be from being estrogen deficient.  Her appetite is doing well.  She has had no problems with nausea or vomiting.  There is been no change in bowel or bladder habits.  She has had no issues with leg swelling.  There is no bleeding.  Overall, performance status is ECOG 1.    Medications:  Current Outpatient Medications:  .  acetaminophen (TYLENOL) 650 MG CR tablet, Take 650 mg by mouth every 8 (eight) hours as needed for pain., Disp: , Rfl:  .  CALCIUM PO, Take 1 tablet by mouth daily., Disp: , Rfl:  .  diazepam (VALIUM) 5 MG tablet, Take 5 mg by mouth daily as needed., Disp: , Rfl:  .  famciclovir (FAMVIR) 250 MG tablet, Take 1 tablet (250 mg total) by mouth daily., Disp: 30 tablet, Rfl: 6 .  FLUoxetine (PROZAC) 20 MG capsule, Take 3 capsules (60 mg total) by mouth daily., Disp: 270  capsule, Rfl: 1 .  hydrOXYzine (ATARAX/VISTARIL) 25 MG tablet, Take 25 mg by mouth 3 (three) times daily as needed for anxiety., Disp: , Rfl:  .  IBRANCE 100 MG tablet, Take 100 mg by mouth See admin instructions. 174m daily on day 1-21 of cycle. 7 days off.  Repeat., Disp: , Rfl:  .  methocarbamol (ROBAXIN) 500 MG tablet, Take 500 mg by mouth daily., Disp: , Rfl:  .  omeprazole (PRILOSEC) 20 MG capsule, Take 20 mg by mouth daily., Disp: , Rfl:  .  oxyCODONE (OXYCONTIN) 15 mg 12 hr tablet, Take 1 tablet (15 mg total) by mouth every 12 (twelve) hours., Disp: 60 tablet, Rfl: 0 .  polyethylene glycol (MIRALAX / GLYCOLAX) 17 g packet, Take 17 g by mouth daily as needed., Disp: 28 each, Rfl: 0 .  SM SENNA LAXATIVE 8.6 MG tablet, Take 8.6 mg by mouth daily., Disp: , Rfl:  .  sucralfate (CARAFATE) 1 GM/10ML suspension, Take 10 mLs (1 g total) by mouth 4 (four) times daily -  with meals and at bedtime., Disp: 420 mL, Rfl: 6 .  vitamin B-12 (CYANOCOBALAMIN) 1000 MCG tablet, Take 1 tablet (1,000 mcg total) by mouth daily., Disp: 30 tablet, Rfl: 1 .  ondansetron (ZOFRAN) 4 MG tablet, Take 1 tablet (4  mg total) by mouth every 4 (four) hours as needed. (Patient not taking: No sig reported), Disp: 20 tablet, Rfl: 0 .  oxyCODONE (OXY IR/ROXICODONE) 5 MG immediate release tablet, Take 5 mg by mouth every 4 (four) hours as needed for pain. (Patient not taking: No sig reported), Disp: , Rfl:  No current facility-administered medications for this visit.  Facility-Administered Medications Ordered in Other Visits:  .  denosumab (XGEVA) injection 120 mg, 120 mg, Subcutaneous, Once, Inda Mcglothen, Rudell Cobb, MD .  fulvestrant (FASLODEX) injection 500 mg, 500 mg, Intramuscular, Once, Sincere Berlanga, Rudell Cobb, MD  Allergies:  Allergies  Allergen Reactions  . Amoxicillin-Pot Clavulanate Hives  . Codeine Nausea And Vomiting    Needs pre-meds Needs pre-meds    Past Medical History, Surgical history, Social history, and Family  History were reviewed and updated.  Review of Systems: Review of Systems  Constitutional: Positive for appetite change.  HENT:  Negative.   Eyes: Negative.   Respiratory: Negative.   Cardiovascular: Negative.   Gastrointestinal: Positive for abdominal pain and nausea.  Endocrine: Negative.   Genitourinary: Negative.    Musculoskeletal: Positive for arthralgias and back pain.  Skin: Negative.   Neurological: Positive for dizziness.  Hematological: Negative.   Psychiatric/Behavioral: Negative.     Physical Exam:  weight is 159 lb 1.9 oz (72.2 kg). Her oral temperature is 98.4 F (36.9 C). Her blood pressure is 117/73 and her pulse is 78. Her respiration is 20.   Wt Readings from Last 3 Encounters:  09/03/20 159 lb 1.9 oz (72.2 kg)  08/06/20 156 lb (70.8 kg)  07/09/20 155 lb (70.3 kg)    Physical Exam Vitals reviewed.  HENT:     Head: Normocephalic and atraumatic.  Eyes:     Pupils: Pupils are equal, round, and reactive to light.  Cardiovascular:     Rate and Rhythm: Normal rate and regular rhythm.     Heart sounds: Normal heart sounds.  Pulmonary:     Effort: Pulmonary effort is normal.     Breath sounds: Normal breath sounds.  Abdominal:     General: Bowel sounds are normal.     Palpations: Abdomen is soft.  Musculoskeletal:        General: No tenderness or deformity. Normal range of motion.     Cervical back: Normal range of motion.  Lymphadenopathy:     Cervical: No cervical adenopathy.  Skin:    General: Skin is warm and dry.     Findings: No erythema or rash.  Neurological:     Mental Status: She is alert and oriented to person, place, and time.  Psychiatric:        Behavior: Behavior normal.        Thought Content: Thought content normal.        Judgment: Judgment normal.      Lab Results  Component Value Date   WBC 3.6 (L) 09/03/2020   HGB 12.2 09/03/2020   HCT 34.9 (L) 09/03/2020   MCV 105.4 (H) 09/03/2020   PLT 138 (L) 09/03/2020      Chemistry      Component Value Date/Time   NA 142 09/03/2020 0805   K 3.5 09/03/2020 0805   CL 107 09/03/2020 0805   CO2 27 09/03/2020 0805   BUN 15 09/03/2020 0805   CREATININE 0.90 09/03/2020 0805   CREATININE 0.88 10/22/2019 1246      Component Value Date/Time   CALCIUM 9.0 09/03/2020 0805   ALKPHOS 42 09/03/2020 0805  AST 11 (L) 09/03/2020 0805   ALT 12 09/03/2020 0805   BILITOT 0.4 09/03/2020 0805      Impression and Plan: Ms. Galeas is a very charming 55 year old postmenopausal female with metastatic breast cancer.  We actually had seen her many years ago with a carcinoma in situ.  She is the wife of one of our former patients who passed away from ocular melanoma.  She seems to be doing quite well with the Faslodex and Ibrance combination.  I am happy that her quality of life is improving.  She does her pain medication with [facility].  They are doing a good job with respect to her pain medication.  We will continue to follow her along monthly.  She will get her Delton See today.  I do not think we have to do any scans on her probably for another month or so.    [name], MD 4/28/20229:06 AM

## 2020-09-03 NOTE — Patient Instructions (Signed)
Fulvestrant injection What is this medicine? FULVESTRANT (ful VES trant) blocks the effects of estrogen. It is used to treat breast cancer. This medicine may be used for other purposes; ask your health care provider or pharmacist if you have questions. COMMON BRAND NAME(S): FASLODEX What should I tell my health care provider before I take this medicine? They need to know if you have any of these conditions:  bleeding disorders  liver disease  low blood counts, like low white cell, platelet, or red cell counts  an unusual or allergic reaction to fulvestrant, other medicines, foods, dyes, or preservatives  pregnant or trying to get pregnant  breast-feeding How should I use this medicine? This medicine is for injection into a muscle. It is usually given by a health care professional in a hospital or clinic setting. Talk to your pediatrician regarding the use of this medicine in children. Special care may be needed. Overdosage: If you think you have taken too much of this medicine contact a poison control center or emergency room at once. NOTE: This medicine is only for you. Do not share this medicine with others. What if I miss a dose? It is important not to miss your dose. Call your doctor or health care professional if you are unable to keep an appointment. What may interact with this medicine?  medicines that treat or prevent blood clots like warfarin, enoxaparin, dalteparin, apixaban, dabigatran, and rivaroxaban This list may not describe all possible interactions. Give your health care provider a list of all the medicines, herbs, non-prescription drugs, or dietary supplements you use. Also tell them if you smoke, drink alcohol, or use illegal drugs. Some items may interact with your medicine. What should I watch for while using this medicine? Your condition will be monitored carefully while you are receiving this medicine. You will need important blood work done while you are taking  this medicine. Do not become pregnant while taking this medicine or for at least 1 year after stopping it. Women of child-bearing potential will need to have a negative pregnancy test before starting this medicine. Women should inform their doctor if they wish to become pregnant or think they might be pregnant. There is a potential for serious side effects to an unborn child. Men should inform their doctors if they wish to father a child. This medicine may lower sperm counts. Talk to your health care professional or pharmacist for more information. Do not breast-feed an infant while taking this medicine or for 1 year after the last dose. What side effects may I notice from receiving this medicine? Side effects that you should report to your doctor or health care professional as soon as possible:  allergic reactions like skin rash, itching or hives, swelling of the face, lips, or tongue  feeling faint or lightheaded, falls  pain, tingling, numbness, or weakness in the legs  signs and symptoms of infection like fever or chills; cough; flu-like symptoms; sore throat  vaginal bleeding Side effects that usually do not require medical attention (report to your doctor or health care professional if they continue or are bothersome):  aches, pains  constipation  diarrhea  headache  hot flashes  nausea, vomiting  pain at site where injected  stomach pain This list may not describe all possible side effects. Call your doctor for medical advice about side effects. You may report side effects to FDA at 1-800-FDA-1088. Where should I keep my medicine? This drug is given in a hospital or clinic and will  not be stored at home. NOTE: This sheet is a summary. It may not cover all possible information. If you have questions about this medicine, talk to your doctor, pharmacist, or health care provider.  2021 Elsevier/Gold Standard (2017-08-03 11:34:41) Denosumab injection What is this  medicine? DENOSUMAB (den oh sue mab) slows bone breakdown. Prolia is used to treat osteoporosis in women after menopause and in men, and in people who are taking corticosteroids for 6 months or more. Delton See is used to treat a high calcium level due to cancer and to prevent bone fractures and other bone problems caused by multiple myeloma or cancer bone metastases. Delton See is also used to treat giant cell tumor of the bone. This medicine may be used for other purposes; ask your health care provider or pharmacist if you have questions. COMMON BRAND NAME(S): Prolia, XGEVA What should I tell my health care provider before I take this medicine? They need to know if you have any of these conditions:  dental disease  having surgery or tooth extraction  infection  kidney disease  low levels of calcium or Vitamin D in the blood  malnutrition  on hemodialysis  skin conditions or sensitivity  thyroid or parathyroid disease  an unusual reaction to denosumab, other medicines, foods, dyes, or preservatives  pregnant or trying to get pregnant  breast-feeding How should I use this medicine? This medicine is for injection under the skin. It is given by a health care professional in a hospital or clinic setting. A special MedGuide will be given to you before each treatment. Be sure to read this information carefully each time. For Prolia, talk to your pediatrician regarding the use of this medicine in children. Special care may be needed. For Delton See, talk to your pediatrician regarding the use of this medicine in children. While this drug may be prescribed for children as young as 13 years for selected conditions, precautions do apply. Overdosage: If you think you have taken too much of this medicine contact a poison control center or emergency room at once. NOTE: This medicine is only for you. Do not share this medicine with others. What if I miss a dose? It is important not to miss your dose. Call  your doctor or health care professional if you are unable to keep an appointment. What may interact with this medicine? Do not take this medicine with any of the following medications:  other medicines containing denosumab This medicine may also interact with the following medications:  medicines that lower your chance of fighting infection  steroid medicines like prednisone or cortisone This list may not describe all possible interactions. Give your health care provider a list of all the medicines, herbs, non-prescription drugs, or dietary supplements you use. Also tell them if you smoke, drink alcohol, or use illegal drugs. Some items may interact with your medicine. What should I watch for while using this medicine? Visit your doctor or health care professional for regular checks on your progress. Your doctor or health care professional may order blood tests and other tests to see how you are doing. Call your doctor or health care professional for advice if you get a fever, chills or sore throat, or other symptoms of a cold or flu. Do not treat yourself. This drug may decrease your body's ability to fight infection. Try to avoid being around people who are sick. You should make sure you get enough calcium and vitamin D while you are taking this medicine, unless your doctor tells  you not to. Discuss the foods you eat and the vitamins you take with your health care professional. See your dentist regularly. Brush and floss your teeth as directed. Before you have any dental work done, tell your dentist you are receiving this medicine. Do not become pregnant while taking this medicine or for 5 months after stopping it. Talk with your doctor or health care professional about your birth control options while taking this medicine. Women should inform their doctor if they wish to become pregnant or think they might be pregnant. There is a potential for serious side effects to an unborn child. Talk to your  health care professional or pharmacist for more information. What side effects may I notice from receiving this medicine? Side effects that you should report to your doctor or health care professional as soon as possible:  allergic reactions like skin rash, itching or hives, swelling of the face, lips, or tongue  bone pain  breathing problems  dizziness  jaw pain, especially after dental work  redness, blistering, peeling of the skin  signs and symptoms of infection like fever or chills; cough; sore throat; pain or trouble passing urine  signs of low calcium like fast heartbeat, muscle cramps or muscle pain; pain, tingling, numbness in the hands or feet; seizures  unusual bleeding or bruising  unusually weak or tired Side effects that usually do not require medical attention (report to your doctor or health care professional if they continue or are bothersome):  constipation  diarrhea  headache  joint pain  loss of appetite  muscle pain  runny nose  tiredness  upset stomach This list may not describe all possible side effects. Call your doctor for medical advice about side effects. You may report side effects to FDA at 1-800-FDA-1088. Where should I keep my medicine? This medicine is only given in a clinic, doctor's office, or other health care setting and will not be stored at home. NOTE: This sheet is a summary. It may not cover all possible information. If you have questions about this medicine, talk to your doctor, pharmacist, or health care provider.  2021 Elsevier/Gold Standard (2017-09-01 16:10:44)  

## 2020-09-04 LAB — CANCER ANTIGEN 27.29: CA 27.29: 46.1 U/mL — ABNORMAL HIGH (ref 0.0–38.6)

## 2020-09-05 DIAGNOSIS — S32000A Wedge compression fracture of unspecified lumbar vertebra, initial encounter for closed fracture: Secondary | ICD-10-CM | POA: Diagnosis not present

## 2020-09-05 DIAGNOSIS — M545 Low back pain, unspecified: Secondary | ICD-10-CM | POA: Diagnosis not present

## 2020-09-08 DIAGNOSIS — M546 Pain in thoracic spine: Secondary | ICD-10-CM | POA: Diagnosis not present

## 2020-09-08 DIAGNOSIS — S46911D Strain of unspecified muscle, fascia and tendon at shoulder and upper arm level, right arm, subsequent encounter: Secondary | ICD-10-CM | POA: Diagnosis not present

## 2020-09-08 DIAGNOSIS — M4805 Spinal stenosis, thoracolumbar region: Secondary | ICD-10-CM | POA: Diagnosis not present

## 2020-09-10 DIAGNOSIS — M4805 Spinal stenosis, thoracolumbar region: Secondary | ICD-10-CM | POA: Diagnosis not present

## 2020-09-10 DIAGNOSIS — M546 Pain in thoracic spine: Secondary | ICD-10-CM | POA: Diagnosis not present

## 2020-09-10 DIAGNOSIS — S46911D Strain of unspecified muscle, fascia and tendon at shoulder and upper arm level, right arm, subsequent encounter: Secondary | ICD-10-CM | POA: Diagnosis not present

## 2020-09-11 MED ORDER — FLUOXETINE HCL 20 MG PO CAPS: 60.0000 mg | ORAL_CAPSULE | Freq: Every day | ORAL | 1 refills | Status: DC

## 2020-09-11 NOTE — Telephone Encounter (Signed)
Patient notified. Prescription resent

## 2020-09-11 NOTE — Telephone Encounter (Signed)
Patient called and said that the pharmacy never received the refill request and she was wondering if it could be sent again. It can be sent to  [id number] - [place], [place] - [address] AT [place]

## 2020-09-11 NOTE — Addendum Note (Signed)
Addended by: Terence Lux A on: 09/11/2020 04:11 PM   Modules accepted: Orders

## 2020-09-15 DIAGNOSIS — S46911D Strain of unspecified muscle, fascia and tendon at shoulder and upper arm level, right arm, subsequent encounter: Secondary | ICD-10-CM | POA: Diagnosis not present

## 2020-09-15 DIAGNOSIS — M4805 Spinal stenosis, thoracolumbar region: Secondary | ICD-10-CM | POA: Diagnosis not present

## 2020-09-15 DIAGNOSIS — M546 Pain in thoracic spine: Secondary | ICD-10-CM | POA: Diagnosis not present

## 2020-09-21 DIAGNOSIS — S46911D Strain of unspecified muscle, fascia and tendon at shoulder and upper arm level, right arm, subsequent encounter: Secondary | ICD-10-CM | POA: Diagnosis not present

## 2020-09-21 DIAGNOSIS — M4805 Spinal stenosis, thoracolumbar region: Secondary | ICD-10-CM | POA: Diagnosis not present

## 2020-09-21 DIAGNOSIS — M546 Pain in thoracic spine: Secondary | ICD-10-CM | POA: Diagnosis not present

## 2020-09-24 DIAGNOSIS — M546 Pain in thoracic spine: Secondary | ICD-10-CM | POA: Diagnosis not present

## 2020-09-24 DIAGNOSIS — S46911D Strain of unspecified muscle, fascia and tendon at shoulder and upper arm level, right arm, subsequent encounter: Secondary | ICD-10-CM | POA: Diagnosis not present

## 2020-09-24 DIAGNOSIS — M4805 Spinal stenosis, thoracolumbar region: Secondary | ICD-10-CM | POA: Diagnosis not present

## 2020-09-30 DIAGNOSIS — M4805 Spinal stenosis, thoracolumbar region: Secondary | ICD-10-CM | POA: Diagnosis not present

## 2020-09-30 DIAGNOSIS — M546 Pain in thoracic spine: Secondary | ICD-10-CM | POA: Diagnosis not present

## 2020-09-30 DIAGNOSIS — S46911D Strain of unspecified muscle, fascia and tendon at shoulder and upper arm level, right arm, subsequent encounter: Secondary | ICD-10-CM | POA: Diagnosis not present

## 2020-10-01 ENCOUNTER — Telehealth: Payer: Self-pay

## 2020-10-01 ENCOUNTER — Encounter: Payer: Self-pay | Admitting: Hematology & Oncology

## 2020-10-01 ENCOUNTER — Inpatient Hospital Stay (HOSPITAL_BASED_OUTPATIENT_CLINIC_OR_DEPARTMENT_OTHER): Payer: BC Managed Care – PPO | Admitting: Hematology & Oncology

## 2020-10-01 ENCOUNTER — Inpatient Hospital Stay: Payer: BC Managed Care – PPO

## 2020-10-01 ENCOUNTER — Inpatient Hospital Stay: Payer: BC Managed Care – PPO | Attending: Hematology & Oncology

## 2020-10-01 ENCOUNTER — Other Ambulatory Visit: Payer: Self-pay

## 2020-10-01 VITALS — BP 125/76 | HR 79 | Temp 98.3°F | Resp 18 | Wt 163.0 lb

## 2020-10-01 DIAGNOSIS — Z86000 Personal history of in-situ neoplasm of breast: Secondary | ICD-10-CM | POA: Diagnosis not present

## 2020-10-01 DIAGNOSIS — Z79818 Long term (current) use of other agents affecting estrogen receptors and estrogen levels: Secondary | ICD-10-CM | POA: Insufficient documentation

## 2020-10-01 DIAGNOSIS — C7951 Secondary malignant neoplasm of bone: Secondary | ICD-10-CM | POA: Insufficient documentation

## 2020-10-01 DIAGNOSIS — C50919 Malignant neoplasm of unspecified site of unspecified female breast: Secondary | ICD-10-CM

## 2020-10-01 DIAGNOSIS — Z170 Estrogen receptor positive status [ER+]: Secondary | ICD-10-CM | POA: Diagnosis not present

## 2020-10-01 DIAGNOSIS — C50911 Malignant neoplasm of unspecified site of right female breast: Secondary | ICD-10-CM | POA: Insufficient documentation

## 2020-10-01 DIAGNOSIS — Z79899 Other long term (current) drug therapy: Secondary | ICD-10-CM | POA: Diagnosis not present

## 2020-10-01 LAB — CMP (CANCER CENTER ONLY)
ALT: 11 U/L (ref 0–44)
AST: 11 U/L — ABNORMAL LOW (ref 15–41)
Albumin: 4.4 g/dL (ref 3.5–5.0)
Alkaline Phosphatase: 46 U/L (ref 38–126)
Anion gap: 5 (ref 5–15)
BUN: 11 mg/dL (ref 6–20)
CO2: 31 mmol/L (ref 22–32)
Calcium: 9.5 mg/dL (ref 8.9–10.3)
Chloride: 104 mmol/L (ref 98–111)
Creatinine: 0.89 mg/dL (ref 0.44–1.00)
GFR, Estimated: >60 mL/min (ref >60)
Glucose, Bld: 97 mg/dL (ref 70–99)
Potassium: 3.9 mmol/L (ref 3.5–5.1)
Sodium: 140 mmol/L (ref 135–145)
Total Bilirubin: 0.5 mg/dL (ref 0.3–1.2)
Total Protein: 6.8 g/dL (ref 6.5–8.1)

## 2020-10-01 LAB — CBC WITH DIFFERENTIAL (CANCER CENTER ONLY)
Abs Immature Granulocytes: 0 10*3/uL (ref 0.00–0.07)
Basophils Absolute: 0.1 10*3/uL (ref 0.0–0.1)
Basophils Relative: 2 %
Eosinophils Absolute: 0.1 10*3/uL (ref 0.0–0.5)
Eosinophils Relative: 2 %
HCT: 36 % (ref 36.0–46.0)
Hemoglobin: 12.5 g/dL (ref 12.0–15.0)
Immature Granulocytes: 0 %
Lymphocytes Relative: 43 %
Lymphs Abs: 1.1 10*3/uL (ref 0.7–4.0)
MCH: 36.2 pg — ABNORMAL HIGH (ref 26.0–34.0)
MCHC: 34.7 g/dL (ref 30.0–36.0)
MCV: 104.3 fL — ABNORMAL HIGH (ref 80.0–100.0)
Monocytes Absolute: 0.2 10*3/uL (ref 0.1–1.0)
Monocytes Relative: 6 %
Neutro Abs: 1.2 10*3/uL — ABNORMAL LOW (ref 1.7–7.7)
Neutrophils Relative %: 47 %
Platelet Count: 109 10*3/uL — ABNORMAL LOW (ref 150–400)
RBC: 3.45 MIL/uL — ABNORMAL LOW (ref 3.87–5.11)
RDW: 13.2 % (ref 11.5–15.5)
WBC Count: 2.6 10*3/uL — ABNORMAL LOW (ref 4.0–10.5)
nRBC: 0 % (ref 0.0–0.2)

## 2020-10-01 MED ORDER — FULVESTRANT 250 MG/5ML IM SOLN
INTRAMUSCULAR | Status: AC
  Filled 2020-10-01: qty 5

## 2020-10-01 MED ORDER — OXYCODONE HCL ER 10 MG PO T12A: 10.0000 mg | EXTENDED_RELEASE_TABLET | Freq: Two times a day (BID) | ORAL | 0 refills | Status: DC

## 2020-10-01 MED ORDER — FULVESTRANT 250 MG/5ML IM SOLN
500.0000 mg | Freq: Once | INTRAMUSCULAR | Status: AC
Start: 2020-10-01 — End: 2020-10-01
  Administered 2020-10-01: 500 mg via INTRAMUSCULAR

## 2020-10-01 NOTE — Progress Notes (Signed)
Hematology and Oncology Follow Up Visit  Caitlin Greene [id number] Jun 27, 1965 55 y.o. 10/01/2020   Principle Diagnosis:   Metastatic breast cancer-ER positive/HER-2 negative --bone metastasis only  Current Therapy:    Faslodex 500 mg IM monthly --start on 04/2020  Ibrance 100 mg p.o. daily (21d on/7d off) - start on 04/2020  Xgeva 120 mg subcu every 3 months -next dose 11/2020     Interim History:  Ms. Caitlin Greene is in for follow-up.  So far, everything is going pretty well with her.  Her main complaint has been occasional pain around the lateral aspect of the right breast.  I am not sure exactly what this might be.  I looked at her bone scan that she had done back in March.  This really did not show anything with the ribs in that area.  I looked at her CT scan.  She does have a implant that is ruptured.  I will know if this might be part of the pain.  She would like to try to decrease the OxyContin to 10 mg p.o. twice daily.  We will see if this might be available to help her out.  Her last CA 27.25 was down to 46.  She continues to respond to treatment with her decrease in CA 27.29.  She has had no problems with cough or shortness of breath.  She has had no nausea or vomiting.  There is been no change in bowel or bladder habits.  As always, she has been very busy.  She has been to weddings.  She is going to be with her sisters over the [facility] Day weekend.  Currently, her performance status is ECOG 1.    Medications:  Current Outpatient Medications:  .  acetaminophen (TYLENOL) 650 MG CR tablet, Take 650 mg by mouth every 8 (eight) hours as needed for pain., Disp: , Rfl:  .  CALCIUM PO, Take 1 tablet by mouth daily., Disp: , Rfl:  .  diazepam (VALIUM) 5 MG tablet, Take 5 mg by mouth daily as needed., Disp: , Rfl:  .  famciclovir (FAMVIR) 250 MG tablet, Take 1 tablet (250 mg total) by mouth daily., Disp: 30 tablet, Rfl: 6 .  FLUoxetine (PROZAC) 20 MG capsule, Take 3 capsules (60 mg  total) by mouth daily., Disp: 270 capsule, Rfl: 1 .  hydrOXYzine (ATARAX/VISTARIL) 25 MG tablet, Take 25 mg by mouth 3 (three) times daily as needed for anxiety., Disp: , Rfl:  .  IBRANCE 100 MG tablet, Take 100 mg by mouth See admin instructions. 162m daily on day 1-21 of cycle. 7 days off.  Repeat., Disp: , Rfl:  .  methocarbamol (ROBAXIN) 500 MG tablet, Take 500 mg by mouth daily., Disp: , Rfl:  .  omeprazole (PRILOSEC) 20 MG capsule, Take 20 mg by mouth daily., Disp: , Rfl:  .  ondansetron (ZOFRAN) 4 MG tablet, Take 1 tablet (4 mg total) by mouth every 4 (four) hours as needed., Disp: 20 tablet, Rfl: 0 .  oxyCODONE (OXY IR/ROXICODONE) 5 MG immediate release tablet, Take 5 mg by mouth every 4 (four) hours as needed for pain., Disp: , Rfl:  .  oxyCODONE (OXYCONTIN) 15 mg 12 hr tablet, Take 1 tablet (15 mg total) by mouth every 12 (twelve) hours., Disp: 60 tablet, Rfl: 0 .  polyethylene glycol (MIRALAX / GLYCOLAX) 17 g packet, Take 17 g by mouth daily as needed., Disp: 28 each, Rfl: 0 .  SM SENNA LAXATIVE 8.6 MG tablet, Take 8.6 mg by  mouth daily., Disp: , Rfl:  .  sucralfate (CARAFATE) 1 GM/10ML suspension, Take 10 mLs (1 g total) by mouth 4 (four) times daily -  with meals and at bedtime., Disp: 420 mL, Rfl: 6 .  vitamin B-12 (CYANOCOBALAMIN) 1000 MCG tablet, Take 1 tablet (1,000 mcg total) by mouth daily., Disp: 30 tablet, Rfl: 1 .  HYDROcodone bit-homatropine (HYCODAN) 5-1.5 MG/5ML syrup, hydrocodone-homatropine 5 mg-1.5 mg/5 mL oral syrup, Disp: , Rfl:   Allergies:  Allergies  Allergen Reactions  . Amoxicillin-Pot Clavulanate Hives  . Codeine Nausea And Vomiting    Needs pre-meds Needs pre-meds    Past Medical History, Surgical history, Social history, and Family History were reviewed and updated.  Review of Systems: Review of Systems  Constitutional: Positive for appetite change.  HENT:  Negative.   Eyes: Negative.   Respiratory: Negative.   Cardiovascular: Negative.    Gastrointestinal: Positive for abdominal pain and nausea.  Endocrine: Negative.   Genitourinary: Negative.    Musculoskeletal: Positive for arthralgias and back pain.  Skin: Negative.   Neurological: Positive for dizziness.  Hematological: Negative.   Psychiatric/Behavioral: Negative.     Physical Exam:  weight is 163 lb (73.9 kg). Her oral temperature is 98.3 F (36.8 C). Her blood pressure is 125/76 and her pulse is 79. Her respiration is 18 and oxygen saturation is 100%.   Wt Readings from Last 3 Encounters:  10/01/20 163 lb (73.9 kg)  09/03/20 159 lb 1.9 oz (72.2 kg)  08/06/20 156 lb (70.8 kg)    Physical Exam Vitals reviewed.  HENT:     Head: Normocephalic and atraumatic.  Eyes:     Pupils: Pupils are equal, round, and reactive to light.  Cardiovascular:     Rate and Rhythm: Normal rate and regular rhythm.     Heart sounds: Normal heart sounds.  Pulmonary:     Effort: Pulmonary effort is normal.     Breath sounds: Normal breath sounds.  Abdominal:     General: Bowel sounds are normal.     Palpations: Abdomen is soft.  Musculoskeletal:        General: No tenderness or deformity. Normal range of motion.     Cervical back: Normal range of motion.  Lymphadenopathy:     Cervical: No cervical adenopathy.  Skin:    General: Skin is warm and dry.     Findings: No erythema or rash.  Neurological:     Mental Status: She is alert and oriented to person, place, and time.  Psychiatric:        Behavior: Behavior normal.        Thought Content: Thought content normal.        Judgment: Judgment normal.      Lab Results  Component Value Date   WBC 2.6 (L) 10/01/2020   HGB 12.5 10/01/2020   HCT 36.0 10/01/2020   MCV 104.3 (H) 10/01/2020   PLT 109 (L) 10/01/2020     Chemistry      Component Value Date/Time   NA 140 10/01/2020 1055   K 3.9 10/01/2020 1055   CL 104 10/01/2020 1055   CO2 31 10/01/2020 1055   BUN 11 10/01/2020 1055   CREATININE 0.89 10/01/2020  1055   CREATININE 0.88 10/22/2019 1246      Component Value Date/Time   CALCIUM 9.5 10/01/2020 1055   ALKPHOS 46 10/01/2020 1055   AST 11 (L) 10/01/2020 1055   ALT 11 10/01/2020 1055   BILITOT 0.5 10/01/2020 1055  Impression and Plan: Ms. Rylee is a very charming 55 year old postmenopausal female with metastatic breast cancer.  We actually had seen her many years ago with a carcinoma in situ.  She is the wife of one of our former patients who passed away from ocular melanoma.  She seems to be doing quite well with the Faslodex and Ibrance combination.  I am happy that her quality of life is improving.  Again, I am unsure as to what is going on with his pain over on the right lateral breast area.  She will be due for some more scans.  We will set these up before we see her back in June.  She will get her Faslodex today.  I am just happy that her quality of life is doing better that she is active.  She is incredibly motivated and and has such great support from her family.    [name], MD 5/26/202212:05 PM

## 2020-10-01 NOTE — Telephone Encounter (Signed)
appts made per 5.26.22 los and pt req to vie on [employer]

## 2020-10-01 NOTE — Progress Notes (Signed)
Ok to treat with ANC of 1.2 per DR Marin Olp. dph

## 2020-10-01 NOTE — Patient Instructions (Signed)
Fulvestrant injection What is this medicine? FULVESTRANT (ful VES trant) blocks the effects of estrogen. It is used to treat breast cancer. This medicine may be used for other purposes; ask your health care provider or pharmacist if you have questions. COMMON BRAND NAME(S): FASLODEX What should I tell my health care provider before I take this medicine? They need to know if you have any of these conditions:  bleeding disorders  liver disease  low blood counts, like low white cell, platelet, or red cell counts  an unusual or allergic reaction to fulvestrant, other medicines, foods, dyes, or preservatives  pregnant or trying to get pregnant  breast-feeding How should I use this medicine? This medicine is for injection into a muscle. It is usually given by a health care professional in a hospital or clinic setting. Talk to your pediatrician regarding the use of this medicine in children. Special care may be needed. Overdosage: If you think you have taken too much of this medicine contact a poison control center or emergency room at once. NOTE: This medicine is only for you. Do not share this medicine with others. What if I miss a dose? It is important not to miss your dose. Call your doctor or health care professional if you are unable to keep an appointment. What may interact with this medicine?  medicines that treat or prevent blood clots like warfarin, enoxaparin, dalteparin, apixaban, dabigatran, and rivaroxaban This list may not describe all possible interactions. Give your health care provider a list of all the medicines, herbs, non-prescription drugs, or dietary supplements you use. Also tell them if you smoke, drink alcohol, or use illegal drugs. Some items may interact with your medicine. What should I watch for while using this medicine? Your condition will be monitored carefully while you are receiving this medicine. You will need important blood work done while you are taking  this medicine. Do not become pregnant while taking this medicine or for at least 1 year after stopping it. Women of child-bearing potential will need to have a negative pregnancy test before starting this medicine. Women should inform their doctor if they wish to become pregnant or think they might be pregnant. There is a potential for serious side effects to an unborn child. Men should inform their doctors if they wish to father a child. This medicine may lower sperm counts. Talk to your health care professional or pharmacist for more information. Do not breast-feed an infant while taking this medicine or for 1 year after the last dose. What side effects may I notice from receiving this medicine? Side effects that you should report to your doctor or health care professional as soon as possible:  allergic reactions like skin rash, itching or hives, swelling of the face, lips, or tongue  feeling faint or lightheaded, falls  pain, tingling, numbness, or weakness in the legs  signs and symptoms of infection like fever or chills; cough; flu-like symptoms; sore throat  vaginal bleeding Side effects that usually do not require medical attention (report to your doctor or health care professional if they continue or are bothersome):  aches, pains  constipation  diarrhea  headache  hot flashes  nausea, vomiting  pain at site where injected  stomach pain This list may not describe all possible side effects. Call your doctor for medical advice about side effects. You may report side effects to FDA at 1-800-FDA-1088. Where should I keep my medicine? This drug is given in a hospital or clinic and will   not be stored at home. NOTE: This sheet is a summary. It may not cover all possible information. If you have questions about this medicine, talk to your doctor, pharmacist, or health care provider.  2021 Elsevier/Gold Standard (2017-08-03 11:34:41)  

## 2020-10-02 DIAGNOSIS — S46911D Strain of unspecified muscle, fascia and tendon at shoulder and upper arm level, right arm, subsequent encounter: Secondary | ICD-10-CM | POA: Diagnosis not present

## 2020-10-02 DIAGNOSIS — M4805 Spinal stenosis, thoracolumbar region: Secondary | ICD-10-CM | POA: Diagnosis not present

## 2020-10-02 DIAGNOSIS — M546 Pain in thoracic spine: Secondary | ICD-10-CM | POA: Diagnosis not present

## 2020-10-02 LAB — CANCER ANTIGEN 27.29: CA 27.29: 48.8 U/mL — ABNORMAL HIGH (ref 0.0–38.6)

## 2020-10-05 DIAGNOSIS — M545 Low back pain, unspecified: Secondary | ICD-10-CM | POA: Diagnosis not present

## 2020-10-05 DIAGNOSIS — S32000A Wedge compression fracture of unspecified lumbar vertebra, initial encounter for closed fracture: Secondary | ICD-10-CM | POA: Diagnosis not present

## 2020-10-07 DIAGNOSIS — S46911D Strain of unspecified muscle, fascia and tendon at shoulder and upper arm level, right arm, subsequent encounter: Secondary | ICD-10-CM | POA: Diagnosis not present

## 2020-10-07 DIAGNOSIS — M4805 Spinal stenosis, thoracolumbar region: Secondary | ICD-10-CM | POA: Diagnosis not present

## 2020-10-07 DIAGNOSIS — M546 Pain in thoracic spine: Secondary | ICD-10-CM | POA: Diagnosis not present

## 2020-10-20 DIAGNOSIS — M4805 Spinal stenosis, thoracolumbar region: Secondary | ICD-10-CM | POA: Diagnosis not present

## 2020-10-20 DIAGNOSIS — M546 Pain in thoracic spine: Secondary | ICD-10-CM | POA: Diagnosis not present

## 2020-10-20 DIAGNOSIS — S46911D Strain of unspecified muscle, fascia and tendon at shoulder and upper arm level, right arm, subsequent encounter: Secondary | ICD-10-CM | POA: Diagnosis not present

## 2020-10-22 DIAGNOSIS — M546 Pain in thoracic spine: Secondary | ICD-10-CM | POA: Diagnosis not present

## 2020-10-22 DIAGNOSIS — S46911D Strain of unspecified muscle, fascia and tendon at shoulder and upper arm level, right arm, subsequent encounter: Secondary | ICD-10-CM | POA: Diagnosis not present

## 2020-10-22 DIAGNOSIS — M4805 Spinal stenosis, thoracolumbar region: Secondary | ICD-10-CM | POA: Diagnosis not present

## 2020-10-23 DIAGNOSIS — J029 Acute pharyngitis, unspecified: Secondary | ICD-10-CM | POA: Diagnosis not present

## 2020-10-23 DIAGNOSIS — Z20828 Contact with and (suspected) exposure to other viral communicable diseases: Secondary | ICD-10-CM | POA: Diagnosis not present

## 2020-10-27 DIAGNOSIS — M546 Pain in thoracic spine: Secondary | ICD-10-CM | POA: Diagnosis not present

## 2020-10-27 DIAGNOSIS — M4805 Spinal stenosis, thoracolumbar region: Secondary | ICD-10-CM | POA: Diagnosis not present

## 2020-10-27 DIAGNOSIS — S46911D Strain of unspecified muscle, fascia and tendon at shoulder and upper arm level, right arm, subsequent encounter: Secondary | ICD-10-CM | POA: Diagnosis not present

## 2020-10-29 DIAGNOSIS — M4805 Spinal stenosis, thoracolumbar region: Secondary | ICD-10-CM | POA: Diagnosis not present

## 2020-10-29 DIAGNOSIS — M546 Pain in thoracic spine: Secondary | ICD-10-CM | POA: Diagnosis not present

## 2020-10-29 DIAGNOSIS — S46911D Strain of unspecified muscle, fascia and tendon at shoulder and upper arm level, right arm, subsequent encounter: Secondary | ICD-10-CM | POA: Diagnosis not present

## 2020-10-30 ENCOUNTER — Ambulatory Visit (HOSPITAL_COMMUNITY)
Admission: RE | Admit: 2020-10-30 | Discharge: 2020-10-30 | Disposition: A | Discharge status: Home / Self Care | Payer: BC Managed Care – PPO | Source: Ambulatory Visit | Attending: Hematology & Oncology | Admitting: Hematology & Oncology

## 2020-10-30 ENCOUNTER — Other Ambulatory Visit: Payer: Self-pay

## 2020-10-30 ENCOUNTER — Encounter (HOSPITAL_COMMUNITY)
Admission: RE | Admit: 2020-10-30 | Discharge: 2020-10-30 | Disposition: A | Discharge status: Home / Self Care | Payer: BC Managed Care – PPO | Source: Ambulatory Visit | Attending: Hematology & Oncology | Admitting: Hematology & Oncology

## 2020-10-30 ENCOUNTER — Encounter (HOSPITAL_COMMUNITY): Payer: Self-pay

## 2020-10-30 DIAGNOSIS — K8020 Calculus of gallbladder without cholecystitis without obstruction: Secondary | ICD-10-CM | POA: Diagnosis not present

## 2020-10-30 DIAGNOSIS — C7951 Secondary malignant neoplasm of bone: Secondary | ICD-10-CM | POA: Diagnosis not present

## 2020-10-30 DIAGNOSIS — R918 Other nonspecific abnormal finding of lung field: Secondary | ICD-10-CM | POA: Diagnosis not present

## 2020-10-30 DIAGNOSIS — J984 Other disorders of lung: Secondary | ICD-10-CM | POA: Diagnosis not present

## 2020-10-30 DIAGNOSIS — C50919 Malignant neoplasm of unspecified site of unspecified female breast: Secondary | ICD-10-CM

## 2020-10-30 DIAGNOSIS — K5750 Diverticulosis of both small and large intestine without perforation or abscess without bleeding: Secondary | ICD-10-CM | POA: Diagnosis not present

## 2020-10-30 DIAGNOSIS — K449 Diaphragmatic hernia without obstruction or gangrene: Secondary | ICD-10-CM | POA: Diagnosis not present

## 2020-10-30 MED ORDER — TECHNETIUM TC 99M MEDRONATE IV KIT
19.4000 | PACK | Freq: Once | INTRAVENOUS | Status: AC
  Administered 2020-10-30: 19.4 via INTRAVENOUS

## 2020-11-03 DIAGNOSIS — M4805 Spinal stenosis, thoracolumbar region: Secondary | ICD-10-CM | POA: Diagnosis not present

## 2020-11-03 DIAGNOSIS — M546 Pain in thoracic spine: Secondary | ICD-10-CM | POA: Diagnosis not present

## 2020-11-03 DIAGNOSIS — S46911D Strain of unspecified muscle, fascia and tendon at shoulder and upper arm level, right arm, subsequent encounter: Secondary | ICD-10-CM | POA: Diagnosis not present

## 2020-11-05 DIAGNOSIS — S32000A Wedge compression fracture of unspecified lumbar vertebra, initial encounter for closed fracture: Secondary | ICD-10-CM | POA: Diagnosis not present

## 2020-11-05 DIAGNOSIS — M545 Low back pain, unspecified: Secondary | ICD-10-CM | POA: Diagnosis not present

## 2020-11-06 ENCOUNTER — Inpatient Hospital Stay: Payer: BC Managed Care – PPO | Attending: Hematology & Oncology

## 2020-11-06 ENCOUNTER — Other Ambulatory Visit: Payer: Self-pay | Admitting: *Deleted

## 2020-11-06 ENCOUNTER — Inpatient Hospital Stay: Payer: BC Managed Care – PPO

## 2020-11-06 ENCOUNTER — Telehealth: Payer: Self-pay | Admitting: *Deleted

## 2020-11-06 ENCOUNTER — Other Ambulatory Visit: Payer: Self-pay

## 2020-11-06 ENCOUNTER — Encounter: Payer: Self-pay | Admitting: Hematology & Oncology

## 2020-11-06 ENCOUNTER — Inpatient Hospital Stay (HOSPITAL_BASED_OUTPATIENT_CLINIC_OR_DEPARTMENT_OTHER): Payer: BC Managed Care – PPO | Admitting: Hematology & Oncology

## 2020-11-06 VITALS — BP 117/81 | HR 74 | Temp 98.7°F | Resp 18 | Wt 166.0 lb

## 2020-11-06 DIAGNOSIS — Z170 Estrogen receptor positive status [ER+]: Secondary | ICD-10-CM | POA: Insufficient documentation

## 2020-11-06 DIAGNOSIS — Z79818 Long term (current) use of other agents affecting estrogen receptors and estrogen levels: Secondary | ICD-10-CM | POA: Diagnosis not present

## 2020-11-06 DIAGNOSIS — C50919 Malignant neoplasm of unspecified site of unspecified female breast: Secondary | ICD-10-CM

## 2020-11-06 DIAGNOSIS — G893 Neoplasm related pain (acute) (chronic): Secondary | ICD-10-CM

## 2020-11-06 DIAGNOSIS — Z79899 Other long term (current) drug therapy: Secondary | ICD-10-CM | POA: Insufficient documentation

## 2020-11-06 DIAGNOSIS — C7951 Secondary malignant neoplasm of bone: Secondary | ICD-10-CM | POA: Insufficient documentation

## 2020-11-06 LAB — CMP (CANCER CENTER ONLY)
ALT: 12 U/L (ref 0–44)
AST: 13 U/L — ABNORMAL LOW (ref 15–41)
Albumin: 4.3 g/dL (ref 3.5–5.0)
Alkaline Phosphatase: 54 U/L (ref 38–126)
Anion gap: 6 (ref 5–15)
BUN: 19 mg/dL (ref 6–20)
CO2: 28 mmol/L (ref 22–32)
Calcium: 9.3 mg/dL (ref 8.9–10.3)
Chloride: 107 mmol/L (ref 98–111)
Creatinine: 0.87 mg/dL (ref 0.44–1.00)
GFR, Estimated: >60 mL/min (ref >60)
Glucose, Bld: 87 mg/dL (ref 70–99)
Potassium: 3.9 mmol/L (ref 3.5–5.1)
Sodium: 141 mmol/L (ref 135–145)
Total Bilirubin: 0.4 mg/dL (ref 0.3–1.2)
Total Protein: 6.6 g/dL (ref 6.5–8.1)

## 2020-11-06 LAB — CBC WITH DIFFERENTIAL (CANCER CENTER ONLY)
Abs Immature Granulocytes: 0.01 10*3/uL (ref 0.00–0.07)
Basophils Absolute: 0.1 10*3/uL (ref 0.0–0.1)
Basophils Relative: 1 %
Eosinophils Absolute: 0.1 10*3/uL (ref 0.0–0.5)
Eosinophils Relative: 1 %
HCT: 35.6 % — ABNORMAL LOW (ref 36.0–46.0)
Hemoglobin: 12.4 g/dL (ref 12.0–15.0)
Immature Granulocytes: 0 %
Lymphocytes Relative: 40 %
Lymphs Abs: 1.6 10*3/uL (ref 0.7–4.0)
MCH: 36.5 pg — ABNORMAL HIGH (ref 26.0–34.0)
MCHC: 34.8 g/dL (ref 30.0–36.0)
MCV: 104.7 fL — ABNORMAL HIGH (ref 80.0–100.0)
Monocytes Absolute: 0.5 10*3/uL (ref 0.1–1.0)
Monocytes Relative: 13 %
Neutro Abs: 1.7 10*3/uL (ref 1.7–7.7)
Neutrophils Relative %: 45 %
Platelet Count: 154 10*3/uL (ref 150–400)
RBC: 3.4 MIL/uL — ABNORMAL LOW (ref 3.87–5.11)
RDW: 14.1 % (ref 11.5–15.5)
WBC Count: 3.9 10*3/uL — ABNORMAL LOW (ref 4.0–10.5)
nRBC: 0 % (ref 0.0–0.2)

## 2020-11-06 MED ORDER — FULVESTRANT 250 MG/5ML IM SOLN
500.0000 mg | Freq: Once | INTRAMUSCULAR | Status: AC
  Administered 2020-11-06: 500 mg via INTRAMUSCULAR

## 2020-11-06 MED ORDER — OXYCODONE HCL ER 10 MG PO T12A: 10.0000 mg | EXTENDED_RELEASE_TABLET | Freq: Two times a day (BID) | ORAL | 0 refills | Status: DC

## 2020-11-06 MED ORDER — OXYCODONE HCL 5 MG PO TABS: 5.0000 mg | ORAL_TABLET | Freq: Three times a day (TID) | ORAL | 0 refills | Status: AC | PRN

## 2020-11-06 NOTE — Patient Instructions (Signed)
Fulvestrant injection What is this medication? FULVESTRANT (ful VES trant) blocks the effects of estrogen. It is used to treat breast cancer. This medicine may be used for other purposes; ask your health care provider or pharmacist if you have questions. COMMON BRAND NAME(S): FASLODEX What should I tell my care team before I take this medication? They need to know if you have any of these conditions: bleeding disorders liver disease low blood counts, like low white cell, platelet, or red cell counts an unusual or allergic reaction to fulvestrant, other medicines, foods, dyes, or preservatives pregnant or trying to get pregnant breast-feeding How should I use this medication? This medicine is for injection into a muscle. It is usually given by a health care professional in a hospital or clinic setting. Talk to your pediatrician regarding the use of this medicine in children. Special care may be needed. Overdosage: If you think you have taken too much of this medicine contact a poison control center or emergency room at once. NOTE: This medicine is only for you. Do not share this medicine with others. What if I miss a dose? It is important not to miss your dose. Call your doctor or health care professional if you are unable to keep an appointment. What may interact with this medication? medicines that treat or prevent blood clots like warfarin, enoxaparin, dalteparin, apixaban, dabigatran, and rivaroxaban This list may not describe all possible interactions. Give your health care provider a list of all the medicines, herbs, non-prescription drugs, or dietary supplements you use. Also tell them if you smoke, drink alcohol, or use illegal drugs. Some items may interact with your medicine. What should I watch for while using this medication? Your condition will be monitored carefully while you are receiving this medicine. You will need important blood work done while you are taking this  medicine. Do not become pregnant while taking this medicine or for at least 1 year after stopping it. Women of child-bearing potential will need to have a negative pregnancy test before starting this medicine. Women should inform their doctor if they wish to become pregnant or think they might be pregnant. There is a potential for serious side effects to an unborn child. Men should inform their doctors if they wish to father a child. This medicine may lower sperm counts. Talk to your health care professional or pharmacist for more information. Do not breast-feed an infant while taking this medicine or for 1 year after the last dose. What side effects may I notice from receiving this medication? Side effects that you should report to your doctor or health care professional as soon as possible: allergic reactions like skin rash, itching or hives, swelling of the face, lips, or tongue feeling faint or lightheaded, falls pain, tingling, numbness, or weakness in the legs signs and symptoms of infection like fever or chills; cough; flu-like symptoms; sore throat vaginal bleeding Side effects that usually do not require medical attention (report to your doctor or health care professional if they continue or are bothersome): aches, pains constipation diarrhea headache hot flashes nausea, vomiting pain at site where injected stomach pain This list may not describe all possible side effects. Call your doctor for medical advice about side effects. You may report side effects to FDA at 1-800-FDA-1088. Where should I keep my medication? This drug is given in a hospital or clinic and will not be stored at home. NOTE: This sheet is a summary. It may not cover all possible information. If you have   questions about this medicine, talk to your doctor, pharmacist, or health care provider.  2022 Elsevier/Gold Standard (2017-08-03 11:34:41)  

## 2020-11-06 NOTE — Telephone Encounter (Signed)
Per 11/06/20 los - gave patient upcoming appointments - patient confirmed - view mychart 

## 2020-11-06 NOTE — Progress Notes (Signed)
Hematology and Oncology Follow Up Visit  Caitlin Greene [id number] 1966-02-26 55 y.o. 11/06/2020   Principle Diagnosis:  Metastatic breast cancer-ER positive/HER-2 negative --bone metastasis only  Current Therapy:   Faslodex 500 mg IM monthly --start on 04/2020 Ibrance 100 mg p.o. daily (21d on/7d off) - start on 04/2020 Xgeva 120 mg subcu every 3 months -next dose 11/2020     Interim History:  Caitlin Greene is in for follow-up.  She comes in with her sister.  I asked the to care of her sister's husband a few years ago.  We did go ahead and get scans on Caitlin Greene.  Everything looks stable.  She has CT scans and bone scan done.  Her disease is limited to her bones.  There really is nothing on the scans that show that she has progressive disease.  Her last CA 27.29 was stable at 49.  She has a good appetite.  She has had no change in bowel or bladder habits.  She has had no nausea or vomiting.  She is try to get off the OxyContin.  I told her that if she wanted to get off of OxyContin, she would switch to oxycodone and space out the oxycodone administration.  I am sure that this will work for her.  I told her that it could take several months before she actually can get off medication.  She has had no bleeding.  She has had no headache.  There is been some pain over in the right side.  She had a tiny gallstone on the CT scan.  I told her that the gallstone is not the cause of her pain.  She has had no leg swelling.  Overall, I would have to say that her performance status is ECOG 1.     Medications:  Current Outpatient Medications:    acetaminophen (TYLENOL) 650 MG CR tablet, Take 650 mg by mouth every 8 (eight) hours as needed for pain., Disp: , Rfl:    CALCIUM PO, Take 1 tablet by mouth daily., Disp: , Rfl:    diazepam (VALIUM) 5 MG tablet, Take 5 mg by mouth daily as needed., Disp: , Rfl:    famciclovir (FAMVIR) 250 MG tablet, Take 1 tablet (250 mg total) by mouth daily., Disp: 30  tablet, Rfl: 6   FLUoxetine (PROZAC) 20 MG capsule, Take 3 capsules (60 mg total) by mouth daily., Disp: 270 capsule, Rfl: 1   HYDROcodone bit-homatropine (HYCODAN) 5-1.5 MG/5ML syrup, hydrocodone-homatropine 5 mg-1.5 mg/5 mL oral syrup, Disp: , Rfl:    hydrOXYzine (ATARAX/VISTARIL) 25 MG tablet, Take 25 mg by mouth 3 (three) times daily as needed for anxiety., Disp: , Rfl:    IBRANCE 100 MG tablet, Take 100 mg by mouth See admin instructions. 14m daily on day 1-21 of cycle. 7 days off.  Repeat., Disp: , Rfl:    methocarbamol (ROBAXIN) 500 MG tablet, Take 500 mg by mouth daily., Disp: , Rfl:    omeprazole (PRILOSEC) 20 MG capsule, Take 20 mg by mouth daily., Disp: , Rfl:    ondansetron (ZOFRAN) 4 MG tablet, Take 1 tablet (4 mg total) by mouth every 4 (four) hours as needed., Disp: 20 tablet, Rfl: 0   oxyCODONE (OXY IR/ROXICODONE) 5 MG immediate release tablet, Take 5 mg by mouth every 4 (four) hours as needed for pain., Disp: , Rfl:    oxyCODONE (OXYCONTIN) 10 mg 12 hr tablet, Take 1 tablet (10 mg total) by mouth every 12 (twelve) hours., Disp: 60 tablet,  Rfl: 0   polyethylene glycol (MIRALAX / GLYCOLAX) 17 g packet, Take 17 g by mouth daily as needed., Disp: 28 each, Rfl: 0   SM SENNA LAXATIVE 8.6 MG tablet, Take 8.6 mg by mouth daily., Disp: , Rfl:    sucralfate (CARAFATE) 1 GM/10ML suspension, Take 10 mLs (1 g total) by mouth 4 (four) times daily -  with meals and at bedtime., Disp: 420 mL, Rfl: 6   vitamin B-12 (CYANOCOBALAMIN) 1000 MCG tablet, Take 1 tablet (1,000 mcg total) by mouth daily., Disp: 30 tablet, Rfl: 1 No current facility-administered medications for this visit.  Facility-Administered Medications Ordered in Other Visits:    fulvestrant (FASLODEX) injection 500 mg, 500 mg, Intramuscular, Once, Jazlen Ogarro, Rudell Cobb, MD  Allergies:  Allergies  Allergen Reactions   Amoxicillin-Pot Clavulanate Hives and Itching   Penicillins Hives, Itching and Other (See Comments)   Codeine Nausea  And Vomiting    Needs pre-meds Needs pre-meds Needs pre-meds Needs pre-meds Needs pre-meds Needs pre-meds Needs pre-meds Needs pre-meds    Past Medical History, Surgical history, Social history, and Family History were reviewed and updated.  Review of Systems: Review of Systems  Constitutional:  Positive for appetite change.  HENT:  Negative.    Eyes: Negative.   Respiratory: Negative.    Cardiovascular: Negative.   Gastrointestinal:  Positive for abdominal pain and nausea.  Endocrine: Negative.   Genitourinary: Negative.    Musculoskeletal:  Positive for arthralgias and back pain.  Skin: Negative.   Neurological:  Positive for dizziness.  Hematological: Negative.   Psychiatric/Behavioral: Negative.     Physical Exam:  weight is 166 lb (75.3 kg). Her oral temperature is 98.7 F (37.1 C). Her blood pressure is 117/81 and her pulse is 74. Her respiration is 18 and oxygen saturation is 100%.   Wt Readings from Last 3 Encounters:  11/06/20 166 lb (75.3 kg)  10/01/20 163 lb (73.9 kg)  09/03/20 159 lb 1.9 oz (72.2 kg)    Physical Exam Vitals reviewed.  HENT:     Head: Normocephalic and atraumatic.  Eyes:     Pupils: Pupils are equal, round, and reactive to light.  Cardiovascular:     Rate and Rhythm: Normal rate and regular rhythm.     Heart sounds: Normal heart sounds.  Pulmonary:     Effort: Pulmonary effort is normal.     Breath sounds: Normal breath sounds.  Abdominal:     General: Bowel sounds are normal.     Palpations: Abdomen is soft.  Musculoskeletal:        General: No tenderness or deformity. Normal range of motion.     Cervical back: Normal range of motion.  Lymphadenopathy:     Cervical: No cervical adenopathy.  Skin:    General: Skin is warm and dry.     Findings: No erythema or rash.  Neurological:     Mental Status: She is alert and oriented to person, place, and time.  Psychiatric:        Behavior: Behavior normal.        Thought Content:  Thought content normal.        Judgment: Judgment normal.     Lab Results  Component Value Date   WBC 3.9 (L) 11/06/2020   HGB 12.4 11/06/2020   HCT 35.6 (L) 11/06/2020   MCV 104.7 (H) 11/06/2020   PLT 154 11/06/2020     Chemistry      Component Value Date/Time   NA 141 11/06/2020 0855  K 3.9 11/06/2020 0855   CL 107 11/06/2020 0855   CO2 28 11/06/2020 0855   BUN 19 11/06/2020 0855   CREATININE 0.87 11/06/2020 0855   CREATININE 0.88 10/22/2019 1246      Component Value Date/Time   CALCIUM 9.3 11/06/2020 0855   ALKPHOS 54 11/06/2020 0855   AST 13 (L) 11/06/2020 0855   ALT 12 11/06/2020 0855   BILITOT 0.4 11/06/2020 0855      Impression and Plan: Ms. Berrian is a very charming 55 year old postmenopausal female with metastatic breast cancer.  We actually had seen her many years ago with a carcinoma in situ.  She is the wife of one of our former patients who passed away from ocular melanoma.  For right now, everything is very stable with the Ibrance and Faslodex.  She is doing well with the Ibrance.  Her blood counts are holding on.  I do not see that we need any scans on her probably till after Labor Day.  We will follow along with her CA 27.29.  She will come in next month.  When she comes then she will get her Xgeva.  I am just glad that her quality of life is doing so well.  She is active.  She still is not able to work and I do think she will be able to work.  She gets fatigued easily.  She is on the pain medication and this has affected her mentation and memory.   [name], MD 7/1/202210:18 AM

## 2020-11-07 LAB — CANCER ANTIGEN 27.29: CA 27.29: 46 U/mL — ABNORMAL HIGH (ref 0.0–38.6)

## 2020-11-11 DIAGNOSIS — M4805 Spinal stenosis, thoracolumbar region: Secondary | ICD-10-CM | POA: Diagnosis not present

## 2020-11-11 DIAGNOSIS — M546 Pain in thoracic spine: Secondary | ICD-10-CM | POA: Diagnosis not present

## 2020-11-11 DIAGNOSIS — S46911D Strain of unspecified muscle, fascia and tendon at shoulder and upper arm level, right arm, subsequent encounter: Secondary | ICD-10-CM | POA: Diagnosis not present

## 2020-11-20 DIAGNOSIS — S46911D Strain of unspecified muscle, fascia and tendon at shoulder and upper arm level, right arm, subsequent encounter: Secondary | ICD-10-CM | POA: Diagnosis not present

## 2020-11-20 DIAGNOSIS — M546 Pain in thoracic spine: Secondary | ICD-10-CM | POA: Diagnosis not present

## 2020-11-20 DIAGNOSIS — M4805 Spinal stenosis, thoracolumbar region: Secondary | ICD-10-CM | POA: Diagnosis not present

## 2020-12-03 DIAGNOSIS — M546 Pain in thoracic spine: Secondary | ICD-10-CM | POA: Diagnosis not present

## 2020-12-03 DIAGNOSIS — M4805 Spinal stenosis, thoracolumbar region: Secondary | ICD-10-CM | POA: Diagnosis not present

## 2020-12-03 DIAGNOSIS — S46911D Strain of unspecified muscle, fascia and tendon at shoulder and upper arm level, right arm, subsequent encounter: Secondary | ICD-10-CM | POA: Diagnosis not present

## 2020-12-04 ENCOUNTER — Encounter: Payer: Self-pay | Admitting: Hematology & Oncology

## 2020-12-04 ENCOUNTER — Other Ambulatory Visit: Payer: Self-pay

## 2020-12-04 ENCOUNTER — Inpatient Hospital Stay (HOSPITAL_BASED_OUTPATIENT_CLINIC_OR_DEPARTMENT_OTHER): Payer: BC Managed Care – PPO | Admitting: Hematology & Oncology

## 2020-12-04 ENCOUNTER — Inpatient Hospital Stay: Payer: BC Managed Care – PPO

## 2020-12-04 VITALS — BP 116/77 | HR 76 | Temp 98.2°F | Resp 20 | Wt 170.1 lb

## 2020-12-04 DIAGNOSIS — Z170 Estrogen receptor positive status [ER+]: Secondary | ICD-10-CM | POA: Diagnosis not present

## 2020-12-04 DIAGNOSIS — C50919 Malignant neoplasm of unspecified site of unspecified female breast: Secondary | ICD-10-CM | POA: Diagnosis not present

## 2020-12-04 DIAGNOSIS — Z79818 Long term (current) use of other agents affecting estrogen receptors and estrogen levels: Secondary | ICD-10-CM | POA: Diagnosis not present

## 2020-12-04 DIAGNOSIS — C7951 Secondary malignant neoplasm of bone: Secondary | ICD-10-CM | POA: Diagnosis not present

## 2020-12-04 DIAGNOSIS — Z79899 Other long term (current) drug therapy: Secondary | ICD-10-CM | POA: Diagnosis not present

## 2020-12-04 LAB — CMP (CANCER CENTER ONLY)
ALT: 11 U/L (ref 0–44)
AST: 12 U/L — ABNORMAL LOW (ref 15–41)
Albumin: 4.2 g/dL (ref 3.5–5.0)
Alkaline Phosphatase: 63 U/L (ref 38–126)
Anion gap: 6 (ref 5–15)
BUN: 19 mg/dL (ref 6–20)
CO2: 27 mmol/L (ref 22–32)
Calcium: 9.2 mg/dL (ref 8.9–10.3)
Chloride: 108 mmol/L (ref 98–111)
Creatinine: 0.97 mg/dL (ref 0.44–1.00)
GFR, Estimated: >60 mL/min (ref >60)
Glucose, Bld: 79 mg/dL (ref 70–99)
Potassium: 4.3 mmol/L (ref 3.5–5.1)
Sodium: 141 mmol/L (ref 135–145)
Total Bilirubin: 0.5 mg/dL (ref 0.3–1.2)
Total Protein: 7 g/dL (ref 6.5–8.1)

## 2020-12-04 LAB — CBC WITH DIFFERENTIAL (CANCER CENTER ONLY)
Abs Immature Granulocytes: 0.01 10*3/uL (ref 0.00–0.07)
Basophils Absolute: 0 10*3/uL (ref 0.0–0.1)
Basophils Relative: 2 %
Eosinophils Absolute: 0 10*3/uL (ref 0.0–0.5)
Eosinophils Relative: 1 %
HCT: 35.2 % — ABNORMAL LOW (ref 36.0–46.0)
Hemoglobin: 12.2 g/dL (ref 12.0–15.0)
Immature Granulocytes: 0 %
Lymphocytes Relative: 43 %
Lymphs Abs: 1.1 10*3/uL (ref 0.7–4.0)
MCH: 37.1 pg — ABNORMAL HIGH (ref 26.0–34.0)
MCHC: 34.7 g/dL (ref 30.0–36.0)
MCV: 107 fL — ABNORMAL HIGH (ref 80.0–100.0)
Monocytes Absolute: 0.2 10*3/uL (ref 0.1–1.0)
Monocytes Relative: 9 %
Neutro Abs: 1.1 10*3/uL — ABNORMAL LOW (ref 1.7–7.7)
Neutrophils Relative %: 45 %
Platelet Count: 143 10*3/uL — ABNORMAL LOW (ref 150–400)
RBC: 3.29 MIL/uL — ABNORMAL LOW (ref 3.87–5.11)
RDW: 14.1 % (ref 11.5–15.5)
WBC Count: 2.5 10*3/uL — ABNORMAL LOW (ref 4.0–10.5)
nRBC: 0 % (ref 0.0–0.2)

## 2020-12-04 MED ORDER — FULVESTRANT 250 MG/5ML IM SOLN
500.0000 mg | INTRAMUSCULAR | Status: DC
  Administered 2020-12-04: 500 mg via INTRAMUSCULAR

## 2020-12-04 MED ORDER — DENOSUMAB 120 MG/1.7ML ~~LOC~~ SOLN
120.0000 mg | Freq: Once | SUBCUTANEOUS | Status: AC
  Administered 2020-12-04: 120 mg via SUBCUTANEOUS

## 2020-12-04 MED ORDER — DENOSUMAB 120 MG/1.7ML ~~LOC~~ SOLN
SUBCUTANEOUS | Status: AC
  Filled 2020-12-04: qty 1.7

## 2020-12-04 MED ORDER — FULVESTRANT 250 MG/5ML IM SOLN
INTRAMUSCULAR | Status: AC
  Filled 2020-12-04: qty 5

## 2020-12-04 NOTE — Progress Notes (Signed)
Hematology and Oncology Follow Up Visit  Caitlin Greene [id number] Jul 15, 1965 55 y.o. 12/04/2020   Principle Diagnosis:  Metastatic breast cancer-ER positive/HER-2 negative --bone metastasis only  Current Therapy:   Faslodex 500 mg IM monthly --start on 04/2020 Ibrance 100 mg p.o. daily (21d on/7d off) - start on 04/2020 Xgeva 120 mg subcu every 3 months -next dose 02/2021     Interim History:  Caitlin Greene is in for follow-up.  As always, she looks great.  She is trying to cut back on the oxycodone.  She is now off OxyContin.  She only takes oxycodone twice a day.  Her last CA 27.29 was stable at 46.  She has had no problems with nausea or vomiting.  She has had no problems with bowels or bladder.  She has had no cough or shortness of breath.  There is been no leg swelling.  She has had no rashes.  On occasion, she does have some headaches.  There is no blurred vision.  She has had no issues with swallowing.  There is some pain over on the right anterior lateral lower rib cage.  Again the oxycodone does seem to help this.  Overall, I would have to say her performance status is probably ECOG 1.    Medications:  Current Outpatient Medications:    acetaminophen (TYLENOL) 650 MG CR tablet, Take 650 mg by mouth every 8 (eight) hours as needed for pain., Disp: , Rfl:    CALCIUM PO, Take 1 tablet by mouth daily., Disp: , Rfl:    FLUoxetine (PROZAC) 20 MG capsule, Take 3 capsules (60 mg total) by mouth daily., Disp: 270 capsule, Rfl: 1   hydrOXYzine (ATARAX/VISTARIL) 25 MG tablet, Take 25 mg by mouth 3 (three) times daily as needed for anxiety., Disp: , Rfl:    IBRANCE 100 MG tablet, Take 100 mg by mouth See admin instructions. 133m daily on day 1-21 of cycle. 7 days off.  Repeat., Disp: , Rfl:    omeprazole (PRILOSEC) 20 MG capsule, Take 20 mg by mouth daily., Disp: , Rfl:    ondansetron (ZOFRAN) 4 MG tablet, Take 1 tablet (4 mg total) by mouth every 4 (four) hours as needed., Disp: 20  tablet, Rfl: 0   oxyCODONE (OXY IR/ROXICODONE) 5 MG immediate release tablet, Take 1 tablet (5 mg total) by mouth every 8 (eight) hours as needed for severe pain., Disp: 50 tablet, Rfl: 0   polyethylene glycol (MIRALAX / GLYCOLAX) 17 g packet, Take 17 g by mouth daily as needed., Disp: 28 each, Rfl: 0   SM SENNA LAXATIVE 8.6 MG tablet, Take 8.6 mg by mouth daily., Disp: , Rfl:    vitamin B-12 (CYANOCOBALAMIN) 1000 MCG tablet, Take 1 tablet (1,000 mcg total) by mouth daily., Disp: 30 tablet, Rfl: 1   famciclovir (FAMVIR) 250 MG tablet, Take 1 tablet (250 mg total) by mouth daily. (Patient not taking: Reported on 12/04/2020), Disp: 30 tablet, Rfl: 6   sucralfate (CARAFATE) 1 GM/10ML suspension, Take 10 mLs (1 g total) by mouth 4 (four) times daily -  with meals and at bedtime. (Patient not taking: Reported on 12/04/2020), Disp: 420 mL, Rfl: 6 No current facility-administered medications for this visit.  Facility-Administered Medications Ordered in Other Visits:    denosumab (XGEVA) injection 120 mg, 120 mg, Subcutaneous, Once, Izella Ybanez, PRudell Cobb MD   fulvestrant (FASLODEX) injection 500 mg, 500 mg, Intramuscular, Q30 days, Pharaoh Pio, PRudell Cobb MD  Allergies:  Allergies  Allergen Reactions   Amoxicillin-Pot Clavulanate Hives  and Itching   Penicillins Hives, Itching and Other (See Comments)   Codeine Nausea And Vomiting    Needs pre-meds Needs pre-meds Needs pre-meds Needs pre-meds Needs pre-meds Needs pre-meds Needs pre-meds Needs pre-meds    Past Medical History, Surgical history, Social history, and Family History were reviewed and updated.  Review of Systems: Review of Systems  Constitutional:  Positive for appetite change.  HENT:  Negative.    Eyes: Negative.   Respiratory: Negative.    Cardiovascular: Negative.   Gastrointestinal:  Positive for abdominal pain and nausea.  Endocrine: Negative.   Genitourinary: Negative.    Musculoskeletal:  Positive for arthralgias and back pain.   Skin: Negative.   Neurological:  Positive for dizziness.  Hematological: Negative.   Psychiatric/Behavioral: Negative.     Physical Exam:  weight is 170 lb 1.3 oz (77.1 kg). Her oral temperature is 98.2 F (36.8 C). Her blood pressure is 116/77 and her pulse is 76. Her respiration is 20 and oxygen saturation is 100%.   Wt Readings from Last 3 Encounters:  12/04/20 170 lb 1.3 oz (77.1 kg)  11/06/20 166 lb (75.3 kg)  10/01/20 163 lb (73.9 kg)    Physical Exam Vitals reviewed.  HENT:     Head: Normocephalic and atraumatic.  Eyes:     Pupils: Pupils are equal, round, and reactive to light.  Cardiovascular:     Rate and Rhythm: Normal rate and regular rhythm.     Heart sounds: Normal heart sounds.  Pulmonary:     Effort: Pulmonary effort is normal.     Breath sounds: Normal breath sounds.  Abdominal:     General: Bowel sounds are normal.     Palpations: Abdomen is soft.  Musculoskeletal:        General: No tenderness or deformity. Normal range of motion.     Cervical back: Normal range of motion.  Lymphadenopathy:     Cervical: No cervical adenopathy.  Skin:    General: Skin is warm and dry.     Findings: No erythema or rash.  Neurological:     Mental Status: She is alert and oriented to person, place, and time.  Psychiatric:        Behavior: Behavior normal.        Thought Content: Thought content normal.        Judgment: Judgment normal.     Lab Results  Component Value Date   WBC 2.5 (L) 12/04/2020   HGB 12.2 12/04/2020   HCT 35.2 (L) 12/04/2020   MCV 107.0 (H) 12/04/2020   PLT 143 (L) 12/04/2020     Chemistry      Component Value Date/Time   NA 141 12/04/2020 1043   K 4.3 12/04/2020 1043   CL 108 12/04/2020 1043   CO2 27 12/04/2020 1043   BUN 19 12/04/2020 1043   CREATININE 0.97 12/04/2020 1043   CREATININE 0.88 10/22/2019 1246      Component Value Date/Time   CALCIUM 9.2 12/04/2020 1043   ALKPHOS 63 12/04/2020 1043   AST 12 (L) 12/04/2020 1043    ALT 11 12/04/2020 1043   BILITOT 0.5 12/04/2020 1043      Impression and Plan: Caitlin Greene is a very charming 55 year old postmenopausal female with metastatic breast cancer.  We actually had seen her many years ago with a carcinoma in situ.  She is the wife of one of our former patients who passed away from ocular melanoma.  For right now, everything is very stable with  the Ibrance and Faslodex.  She is doing well with the Ibrance.  Her blood counts are holding on.  I do not see that we need any scans on her probably until October.  I really think she is done very nicely.  Her quality life is doing great.  She is able to do what she would like to do.  She really not taking much in the way of pain medications..  She will get her Delton See today.    We will see her back in another month.    [name], MD 7/29/202211:46 AM

## 2020-12-04 NOTE — Patient Instructions (Signed)
Fulvestrant injection What is this medication? FULVESTRANT (ful VES trant) blocks the effects of estrogen. It is used to treat breast cancer. This medicine may be used for other purposes; ask your health care provider or pharmacist if you have questions. COMMON BRAND NAME(S): FASLODEX What should I tell my care team before I take this medication? They need to know if you have any of these conditions: bleeding disorders liver disease low blood counts, like low white cell, platelet, or red cell counts an unusual or allergic reaction to fulvestrant, other medicines, foods, dyes, or preservatives pregnant or trying to get pregnant breast-feeding How should I use this medication? This medicine is for injection into a muscle. It is usually given by a health care professional in a hospital or clinic setting. Talk to your pediatrician regarding the use of this medicine in children. Special care may be needed. Overdosage: If you think you have taken too much of this medicine contact a poison control center or emergency room at once. NOTE: This medicine is only for you. Do not share this medicine with others. What if I miss a dose? It is important not to miss your dose. Call your doctor or health care professional if you are unable to keep an appointment. What may interact with this medication? medicines that treat or prevent blood clots like warfarin, enoxaparin, dalteparin, apixaban, dabigatran, and rivaroxaban This list may not describe all possible interactions. Give your health care provider a list of all the medicines, herbs, non-prescription drugs, or dietary supplements you use. Also tell them if you smoke, drink alcohol, or use illegal drugs. Some items may interact with your medicine. What should I watch for while using this medication? Your condition will be monitored carefully while you are receiving this medicine. You will need important blood work done while you are taking this  medicine. Do not become pregnant while taking this medicine or for at least 1 year after stopping it. Women of child-bearing potential will need to have a negative pregnancy test before starting this medicine. Women should inform their doctor if they wish to become pregnant or think they might be pregnant. There is a potential for serious side effects to an unborn child. Men should inform their doctors if they wish to father a child. This medicine may lower sperm counts. Talk to your health care professional or pharmacist for more information. Do not breast-feed an infant while taking this medicine or for 1 year after the last dose. What side effects may I notice from receiving this medication? Side effects that you should report to your doctor or health care professional as soon as possible: allergic reactions like skin rash, itching or hives, swelling of the face, lips, or tongue feeling faint or lightheaded, falls pain, tingling, numbness, or weakness in the legs signs and symptoms of infection like fever or chills; cough; flu-like symptoms; sore throat vaginal bleeding Side effects that usually do not require medical attention (report to your doctor or health care professional if they continue or are bothersome): aches, pains constipation diarrhea headache hot flashes nausea, vomiting pain at site where injected stomach pain This list may not describe all possible side effects. Call your doctor for medical advice about side effects. You may report side effects to FDA at 1-800-FDA-1088. Where should I keep my medication? This drug is given in a hospital or clinic and will not be stored at home. NOTE: This sheet is a summary. It may not cover all possible information. If you have   questions about this medicine, talk to your doctor, pharmacist, or health care provider.  2022 Elsevier/Gold Standard (2017-08-03 11:34:41) Denosumab injection What is this medication? DENOSUMAB (den oh sue mab)  slows bone breakdown. Prolia is used to treat osteoporosis in women after menopause and in men, and in people who are taking corticosteroids for 6 months or more. Xgeva is used to treat a high calcium level due to cancer and to prevent bone fractures and other bone problems caused by multiple myeloma or cancer bone metastases. Xgeva is also used to treat giant cell tumor of the bone. This medicine may be used for other purposes; ask your health care provider or pharmacist if you have questions. COMMON BRAND NAME(S): Prolia, XGEVA What should I tell my care team before I take this medication? They need to know if you have any of these conditions: dental disease having surgery or tooth extraction infection kidney disease low levels of calcium or Vitamin D in the blood malnutrition on hemodialysis skin conditions or sensitivity thyroid or parathyroid disease an unusual reaction to denosumab, other medicines, foods, dyes, or preservatives pregnant or trying to get pregnant breast-feeding How should I use this medication? This medicine is for injection under the skin. It is given by a health care professional in a hospital or clinic setting. A special MedGuide will be given to you before each treatment. Be sure to read this information carefully each time. For Prolia, talk to your pediatrician regarding the use of this medicine in children. Special care may be needed. For Xgeva, talk to your pediatrician regarding the use of this medicine in children. While this drug may be prescribed for children as young as 13 years for selected conditions, precautions do apply. Overdosage: If you think you have taken too much of this medicine contact a poison control center or emergency room at once. NOTE: This medicine is only for you. Do not share this medicine with others. What if I miss a dose? It is important not to miss your dose. Call your doctor or health care professional if you are unable to keep an  appointment. What may interact with this medication? Do not take this medicine with any of the following medications: other medicines containing denosumab This medicine may also interact with the following medications: medicines that lower your chance of fighting infection steroid medicines like prednisone or cortisone This list may not describe all possible interactions. Give your health care provider a list of all the medicines, herbs, non-prescription drugs, or dietary supplements you use. Also tell them if you smoke, drink alcohol, or use illegal drugs. Some items may interact with your medicine. What should I watch for while using this medication? Visit your doctor or health care professional for regular checks on your progress. Your doctor or health care professional may order blood tests and other tests to see how you are doing. Call your doctor or health care professional for advice if you get a fever, chills or sore throat, or other symptoms of a cold or flu. Do not treat yourself. This drug may decrease your body's ability to fight infection. Try to avoid being around people who are sick. You should make sure you get enough calcium and vitamin D while you are taking this medicine, unless your doctor tells you not to. Discuss the foods you eat and the vitamins you take with your health care professional. See your dentist regularly. Brush and floss your teeth as directed. Before you have any dental work done, tell   your dentist you are receiving this medicine. Do not become pregnant while taking this medicine or for 5 months after stopping it. Talk with your doctor or health care professional about your birth control options while taking this medicine. Women should inform their doctor if they wish to become pregnant or think they might be pregnant. There is a potential for serious side effects to an unborn child. Talk to your health care professional or pharmacist for more information. What side  effects may I notice from receiving this medication? Side effects that you should report to your doctor or health care professional as soon as possible: allergic reactions like skin rash, itching or hives, swelling of the face, lips, or tongue bone pain breathing problems dizziness jaw pain, especially after dental work redness, blistering, peeling of the skin signs and symptoms of infection like fever or chills; cough; sore throat; pain or trouble passing urine signs of low calcium like fast heartbeat, muscle cramps or muscle pain; pain, tingling, numbness in the hands or feet; seizures unusual bleeding or bruising unusually weak or tired Side effects that usually do not require medical attention (report to your doctor or health care professional if they continue or are bothersome): constipation diarrhea headache joint pain loss of appetite muscle pain runny nose tiredness upset stomach This list may not describe all possible side effects. Call your doctor for medical advice about side effects. You may report side effects to FDA at 1-800-FDA-1088. Where should I keep my medication? This medicine is only given in a clinic, doctor's office, or other health care setting and will not be stored at home. NOTE: This sheet is a summary. It may not cover all possible information. If you have questions about this medicine, talk to your doctor, pharmacist, or health care provider.  2022 Elsevier/Gold Standard (2017-09-01 16:10:44)  

## 2020-12-05 LAB — CANCER ANTIGEN 27.29: CA 27.29: 40.6 U/mL — ABNORMAL HIGH (ref 0.0–38.6)

## 2020-12-09 DIAGNOSIS — M4805 Spinal stenosis, thoracolumbar region: Secondary | ICD-10-CM | POA: Diagnosis not present

## 2020-12-09 DIAGNOSIS — S46911D Strain of unspecified muscle, fascia and tendon at shoulder and upper arm level, right arm, subsequent encounter: Secondary | ICD-10-CM | POA: Diagnosis not present

## 2020-12-09 DIAGNOSIS — M546 Pain in thoracic spine: Secondary | ICD-10-CM | POA: Diagnosis not present

## 2020-12-18 ENCOUNTER — Other Ambulatory Visit: Payer: Self-pay | Admitting: Internal Medicine

## 2020-12-22 DIAGNOSIS — S46911D Strain of unspecified muscle, fascia and tendon at shoulder and upper arm level, right arm, subsequent encounter: Secondary | ICD-10-CM | POA: Diagnosis not present

## 2020-12-22 DIAGNOSIS — M546 Pain in thoracic spine: Secondary | ICD-10-CM | POA: Diagnosis not present

## 2020-12-22 DIAGNOSIS — M4805 Spinal stenosis, thoracolumbar region: Secondary | ICD-10-CM | POA: Diagnosis not present

## 2020-12-31 DIAGNOSIS — M4805 Spinal stenosis, thoracolumbar region: Secondary | ICD-10-CM | POA: Diagnosis not present

## 2020-12-31 DIAGNOSIS — S46911D Strain of unspecified muscle, fascia and tendon at shoulder and upper arm level, right arm, subsequent encounter: Secondary | ICD-10-CM | POA: Diagnosis not present

## 2020-12-31 DIAGNOSIS — M546 Pain in thoracic spine: Secondary | ICD-10-CM | POA: Diagnosis not present

## 2021-01-01 ENCOUNTER — Encounter: Payer: Self-pay | Admitting: Hematology & Oncology

## 2021-01-01 ENCOUNTER — Inpatient Hospital Stay: Payer: BC Managed Care – PPO

## 2021-01-01 ENCOUNTER — Inpatient Hospital Stay: Payer: BC Managed Care – PPO | Attending: Hematology & Oncology

## 2021-01-01 ENCOUNTER — Other Ambulatory Visit: Payer: Self-pay

## 2021-01-01 ENCOUNTER — Inpatient Hospital Stay (HOSPITAL_BASED_OUTPATIENT_CLINIC_OR_DEPARTMENT_OTHER): Payer: BC Managed Care – PPO | Admitting: Hematology & Oncology

## 2021-01-01 VITALS — BP 110/83 | HR 75 | Temp 99.1°F | Resp 17 | Wt 172.8 lb

## 2021-01-01 DIAGNOSIS — R110 Nausea: Secondary | ICD-10-CM | POA: Insufficient documentation

## 2021-01-01 DIAGNOSIS — C50919 Malignant neoplasm of unspecified site of unspecified female breast: Secondary | ICD-10-CM

## 2021-01-01 DIAGNOSIS — Z79818 Long term (current) use of other agents affecting estrogen receptors and estrogen levels: Secondary | ICD-10-CM | POA: Insufficient documentation

## 2021-01-01 DIAGNOSIS — C7951 Secondary malignant neoplasm of bone: Secondary | ICD-10-CM | POA: Insufficient documentation

## 2021-01-01 DIAGNOSIS — Z79899 Other long term (current) drug therapy: Secondary | ICD-10-CM | POA: Insufficient documentation

## 2021-01-01 DIAGNOSIS — R109 Unspecified abdominal pain: Secondary | ICD-10-CM | POA: Diagnosis not present

## 2021-01-01 DIAGNOSIS — Z170 Estrogen receptor positive status [ER+]: Secondary | ICD-10-CM | POA: Insufficient documentation

## 2021-01-01 DIAGNOSIS — M549 Dorsalgia, unspecified: Secondary | ICD-10-CM | POA: Insufficient documentation

## 2021-01-01 DIAGNOSIS — M797 Fibromyalgia: Secondary | ICD-10-CM | POA: Diagnosis not present

## 2021-01-01 LAB — CMP (CANCER CENTER ONLY)
ALT: 10 U/L (ref 0–44)
AST: 12 U/L — ABNORMAL LOW (ref 15–41)
Albumin: 4.3 g/dL (ref 3.5–5.0)
Alkaline Phosphatase: 50 U/L (ref 38–126)
Anion gap: 9 (ref 5–15)
BUN: 14 mg/dL (ref 6–20)
CO2: 25 mmol/L (ref 22–32)
Calcium: 9.2 mg/dL (ref 8.9–10.3)
Chloride: 107 mmol/L (ref 98–111)
Creatinine: 0.89 mg/dL (ref 0.44–1.00)
GFR, Estimated: >60 mL/min (ref >60)
Glucose, Bld: 127 mg/dL — ABNORMAL HIGH (ref 70–99)
Potassium: 3.8 mmol/L (ref 3.5–5.1)
Sodium: 141 mmol/L (ref 135–145)
Total Bilirubin: 0.9 mg/dL (ref 0.3–1.2)
Total Protein: 7 g/dL (ref 6.5–8.1)

## 2021-01-01 LAB — CBC WITH DIFFERENTIAL (CANCER CENTER ONLY)
Abs Immature Granulocytes: 0.01 10*3/uL (ref 0.00–0.07)
Basophils Absolute: 0.1 10*3/uL (ref 0.0–0.1)
Basophils Relative: 2 %
Eosinophils Absolute: 0 10*3/uL (ref 0.0–0.5)
Eosinophils Relative: 1 %
HCT: 34.9 % — ABNORMAL LOW (ref 36.0–46.0)
Hemoglobin: 12.5 g/dL (ref 12.0–15.0)
Immature Granulocytes: 0 %
Lymphocytes Relative: 45 %
Lymphs Abs: 1.3 10*3/uL (ref 0.7–4.0)
MCH: 37.4 pg — ABNORMAL HIGH (ref 26.0–34.0)
MCHC: 35.8 g/dL (ref 30.0–36.0)
MCV: 104.5 fL — ABNORMAL HIGH (ref 80.0–100.0)
Monocytes Absolute: 0.2 10*3/uL (ref 0.1–1.0)
Monocytes Relative: 7 %
Neutro Abs: 1.3 10*3/uL — ABNORMAL LOW (ref 1.7–7.7)
Neutrophils Relative %: 45 %
Platelet Count: 135 10*3/uL — ABNORMAL LOW (ref 150–400)
RBC: 3.34 MIL/uL — ABNORMAL LOW (ref 3.87–5.11)
RDW: 13.9 % (ref 11.5–15.5)
WBC Count: 3 10*3/uL — ABNORMAL LOW (ref 4.0–10.5)
nRBC: 0 % (ref 0.0–0.2)

## 2021-01-01 MED ORDER — FULVESTRANT 250 MG/5ML IM SOLN
500.0000 mg | INTRAMUSCULAR | Status: DC
  Administered 2021-01-01: 500 mg via INTRAMUSCULAR
  Filled 2021-01-01: qty 10

## 2021-01-01 NOTE — Patient Instructions (Signed)
[facility]  Discharge Instructions: Thank you for choosing [facility] to provide your oncology and hematology care.   If you have a lab appointment with the [facility], please go directly to the [facility] and check in at the registration area.  Wear comfortable clothing and clothing appropriate for easy access to any Portacath or PICC line.   We strive to give you quality time with your provider. You may need to reschedule your appointment if you arrive late (15 or more minutes).  Arriving late affects you and other patients whose appointments are after yours.  Also, if you miss three or more appointments without notifying the office, you may be dismissed from the clinic at the provider's discretion.      For prescription refill requests, have your pharmacy contact our office and allow 72 hours for refills to be completed.    Today you received the following chemotherapy and/or immunotherapy agents faslodex     To help prevent nausea and vomiting after your treatment, we encourage you to take your nausea medication as directed.  BELOW ARE SYMPTOMS THAT SHOULD BE REPORTED IMMEDIATELY: *FEVER GREATER THAN 100.4 F (38 C) OR HIGHER *CHILLS OR SWEATING *NAUSEA AND VOMITING THAT IS NOT CONTROLLED WITH YOUR NAUSEA MEDICATION *UNUSUAL SHORTNESS OF BREATH *UNUSUAL BRUISING OR BLEEDING *URINARY PROBLEMS (pain or burning when urinating, or frequent urination) *BOWEL PROBLEMS (unusual diarrhea, constipation, pain near the anus) TENDERNESS IN MOUTH AND THROAT WITH OR WITHOUT PRESENCE OF ULCERS (sore throat, sores in mouth, or a toothache) UNUSUAL RASH, SWELLING OR PAIN  UNUSUAL VAGINAL DISCHARGE OR ITCHING   Items with * indicate a potential emergency and should be followed up as soon as possible or go to the Emergency Department if any problems should occur.  Please show the CHEMOTHERAPY ALERT CARD or IMMUNOTHERAPY ALERT CARD at check-in to the  Emergency Department and triage nurse. Should you have questions after your visit or need to cancel or reschedule your appointment, please contact [facility]  [phone] and follow the prompts.  Office hours are 8:00 a.m. to 4:30 p.m. Monday - Friday. Please note that voicemails left after 4:00 p.m. may not be returned until the following business day.  We are closed weekends and major holidays. You have access to a nurse at all times for urgent questions. Please call the main number to the clinic [phone] and follow the prompts.  For any non-urgent questions, you may also contact your provider using MyChart. We now offer e-Visits for anyone 40 and older to request care online for non-urgent symptoms. For details visit [email or web address].   Also download the MyChart app! Go to the app store, search "MyChart", open the app, select [facility], and log in with your MyChart username and password.  Due to Covid, a mask is required upon entering the hospital/clinic. If you do not have a mask, one will be given to you upon arrival. For doctor visits, patients may have 1 support person aged 66 or older with them. For treatment visits, patients cannot have anyone with them due to current Covid guidelines and our immunocompromised population.

## 2021-01-01 NOTE — Progress Notes (Signed)
Hematology and Oncology Follow Up Visit  Caitlin Greene [id number] [birth year] 55 y.o. 01/01/2021   Principle Diagnosis:  Metastatic breast cancer-ER positive/HER-2 negative --bone metastasis only  Current Therapy:   Faslodex 500 mg IM monthly --start on 04/2020 Ibrance 100 mg p.o. daily (21d on/7d off) - start on 04/2020 Xgeva 120 mg subcu every 3 months -next dose 02/2021     Interim History:  Caitlin Greene is in for follow-up.  As always, she looks great.  Her  birthday is coming up next Friday.  I am sure that she will have a wonderful time.  I am sure her family will really take good care of her.  The bad news is that one of her dogs was bitten by a copperhead.  Thankfully, she did not die from this.  As such, Caitlin Greene is busy taking good care of her.  She is 55 years old.  She feels well.  She is now off oxycodone.  I am very impressed with this.  She takes ibuprofen on occasion.  Her last CA 27.29 was down to 41.  She has had no problems with the Ibrance.  There is no nausea or vomiting.  She has a little bit of discomfort under the right breast area.  I am not sure what this might be.  It might be where she has a bone metastasis.  There is no change in bowel or bladder habits.  She has a good appetite.  She has had no rashes.  There has been no leg swelling..  She has had occasional headache.  There is been no blurred vision.  I think she may have little bit of dizziness.  There has been no bleeding.  She has had no fever.  There is been no cough or shortness of breath.  Thankfully, she has not been exposed to [place].  Overall, I would say performance status is ECOG 1.    Medications:  Current Outpatient Medications:    acetaminophen (TYLENOL) 650 MG CR tablet, Take 650 mg by mouth every 8 (eight) hours as needed for pain., Disp: , Rfl:    CALCIUM PO, Take 1 tablet by mouth daily., Disp: , Rfl:    FLUoxetine (PROZAC) 20 MG capsule, Take 3 capsules (60 mg total) by mouth  daily., Disp: 270 capsule, Rfl: 1   hydrOXYzine (ATARAX/VISTARIL) 25 MG tablet, Take 25 mg by mouth 3 (three) times daily as needed for anxiety., Disp: , Rfl:    IBRANCE 100 MG tablet, Take 100 mg by mouth See admin instructions. 169m daily on day 1-21 of cycle. 7 days off.  Repeat., Disp: , Rfl:    omeprazole (PRILOSEC) 20 MG capsule, Take 20 mg by mouth daily., Disp: , Rfl:    ondansetron (ZOFRAN) 4 MG tablet, Take 1 tablet (4 mg total) by mouth every 4 (four) hours as needed., Disp: 20 tablet, Rfl: 0   oxyCODONE (OXY IR/ROXICODONE) 5 MG immediate release tablet, Take 1 tablet (5 mg total) by mouth every 8 (eight) hours as needed for severe pain., Disp: 50 tablet, Rfl: 0   polyethylene glycol (MIRALAX / GLYCOLAX) 17 g packet, Take 17 g by mouth daily as needed., Disp: 28 each, Rfl: 0   SM SENNA LAXATIVE 8.6 MG tablet, Take 8.6 mg by mouth daily., Disp: , Rfl:    sucralfate (CARAFATE) 1 GM/10ML suspension, Take 10 mLs (1 g total) by mouth 4 (four) times daily -  with meals and at bedtime., Disp: 420 mL, Rfl: 6  vitamin B-12 (CYANOCOBALAMIN) 1000 MCG tablet, Take 1 tablet (1,000 mcg total) by mouth daily., Disp: 30 tablet, Rfl: 1   famciclovir (FAMVIR) 250 MG tablet, Take 1 tablet (250 mg total) by mouth daily. (Patient not taking: No sig reported), Disp: 30 tablet, Rfl: 6 No current facility-administered medications for this visit.  Facility-Administered Medications Ordered in Other Visits:    fulvestrant (FASLODEX) injection 500 mg, 500 mg, Intramuscular, Q30 days, [name], MD, 500 mg at 01/01/21 1515  Allergies:  Allergies  Allergen Reactions   Amoxicillin-Pot Clavulanate Hives and Itching   Penicillins Hives, Itching and Other (See Comments)   Codeine Nausea And Vomiting    Needs pre-meds Needs pre-meds Needs pre-meds Needs pre-meds Needs pre-meds Needs pre-meds Needs pre-meds Needs pre-meds    Past Medical History, Surgical history, Social history, and Family History  were reviewed and updated.  Review of Systems: Review of Systems  Constitutional:  Positive for appetite change.  HENT:  Negative.    Eyes: Negative.   Respiratory: Negative.    Cardiovascular: Negative.   Gastrointestinal:  Positive for abdominal pain and nausea.  Endocrine: Negative.   Genitourinary: Negative.    Musculoskeletal:  Positive for arthralgias and back pain.  Skin: Negative.   Neurological:  Positive for dizziness.  Hematological: Negative.   Psychiatric/Behavioral: Negative.     Physical Exam:  weight is 172 lb 12.8 oz (78.4 kg). Her oral temperature is 99.1 F (37.3 C). Her blood pressure is 110/83 and her pulse is 75. Her respiration is 17 and oxygen saturation is 100%.   Wt Readings from Last 3 Encounters:  01/01/21 172 lb 12.8 oz (78.4 kg)  12/04/20 170 lb 1.3 oz (77.1 kg)  11/06/20 166 lb (75.3 kg)    Physical Exam Vitals reviewed.  HENT:     Head: Normocephalic and atraumatic.  Eyes:     Pupils: Pupils are equal, round, and reactive to light.  Cardiovascular:     Rate and Rhythm: Normal rate and regular rhythm.     Heart sounds: Normal heart sounds.  Pulmonary:     Effort: Pulmonary effort is normal.     Breath sounds: Normal breath sounds.  Abdominal:     General: Bowel sounds are normal.     Palpations: Abdomen is soft.  Musculoskeletal:        General: No tenderness or deformity. Normal range of motion.     Cervical back: Normal range of motion.  Lymphadenopathy:     Cervical: No cervical adenopathy.  Skin:    General: Skin is warm and dry.     Findings: No erythema or rash.  Neurological:     Mental Status: She is alert and oriented to person, place, and time.  Psychiatric:        Behavior: Behavior normal.        Thought Content: Thought content normal.        Judgment: Judgment normal.     Lab Results  Component Value Date   WBC 3.0 (L) 01/01/2021   HGB 12.5 01/01/2021   HCT 34.9 (L) 01/01/2021   MCV 104.5 (H) 01/01/2021    PLT 135 (L) 01/01/2021     Chemistry      Component Value Date/Time   NA 141 01/01/2021 1355   K 3.8 01/01/2021 1355   CL 107 01/01/2021 1355   CO2 25 01/01/2021 1355   BUN 14 01/01/2021 1355   CREATININE 0.89 01/01/2021 1355   CREATININE 0.88 10/22/2019 1246  Component Value Date/Time   CALCIUM 9.2 01/01/2021 1355   ALKPHOS 50 01/01/2021 1355   AST 12 (L) 01/01/2021 1355   ALT 10 01/01/2021 1355   BILITOT 0.9 01/01/2021 1355      Impression and Plan: Ms. Bhat is a very charming 55 year old postmenopausal female with metastatic breast cancer.  We actually had seen her many years ago with a carcinoma in situ.  She is the wife of one of our former patients who passed away from ocular melanoma.  I am just happy that her quality of life is doing so well right now.  We do have to repeat her scans.  I will have to set these up for when we see her back.  If we get the CT scan.  Also get the MRI of the brain.  She gets her Faslodex today.  I will plan to see her back in another month.  I am sure she will have a wonderful birthday.  I will certainly pray hard for her dog.   [name], MD 8/26/20224:30 PM

## 2021-01-02 LAB — CANCER ANTIGEN 27.29: CA 27.29: 51.7 U/mL — ABNORMAL HIGH (ref 0.0–38.6)

## 2021-01-04 ENCOUNTER — Telehealth: Payer: Self-pay | Admitting: *Deleted

## 2021-01-04 ENCOUNTER — Other Ambulatory Visit: Payer: BC Managed Care – PPO

## 2021-01-04 ENCOUNTER — Ambulatory Visit: Payer: BC Managed Care – PPO

## 2021-01-04 ENCOUNTER — Ambulatory Visit: Payer: BC Managed Care – PPO | Admitting: Hematology & Oncology

## 2021-01-04 NOTE — Telephone Encounter (Signed)
Per 01/01/21 los - called and gave upcoming appointments - confirmed - mailed calendar

## 2021-01-07 DIAGNOSIS — S46911D Strain of unspecified muscle, fascia and tendon at shoulder and upper arm level, right arm, subsequent encounter: Secondary | ICD-10-CM | POA: Diagnosis not present

## 2021-01-07 DIAGNOSIS — M4805 Spinal stenosis, thoracolumbar region: Secondary | ICD-10-CM | POA: Diagnosis not present

## 2021-01-07 DIAGNOSIS — M546 Pain in thoracic spine: Secondary | ICD-10-CM | POA: Diagnosis not present

## 2021-01-29 ENCOUNTER — Ambulatory Visit (HOSPITAL_COMMUNITY)
Admission: RE | Admit: 2021-01-29 | Discharge: 2021-01-29 | Disposition: A | Discharge status: Home / Self Care | Payer: BC Managed Care – PPO | Source: Ambulatory Visit | Attending: Hematology & Oncology | Admitting: Hematology & Oncology

## 2021-01-29 ENCOUNTER — Other Ambulatory Visit: Payer: Self-pay

## 2021-01-29 DIAGNOSIS — C50919 Malignant neoplasm of unspecified site of unspecified female breast: Secondary | ICD-10-CM

## 2021-01-29 DIAGNOSIS — K5730 Diverticulosis of large intestine without perforation or abscess without bleeding: Secondary | ICD-10-CM | POA: Diagnosis not present

## 2021-01-29 DIAGNOSIS — C7951 Secondary malignant neoplasm of bone: Secondary | ICD-10-CM | POA: Diagnosis not present

## 2021-01-29 DIAGNOSIS — G9389 Other specified disorders of brain: Secondary | ICD-10-CM | POA: Diagnosis not present

## 2021-01-29 DIAGNOSIS — K449 Diaphragmatic hernia without obstruction or gangrene: Secondary | ICD-10-CM | POA: Diagnosis not present

## 2021-01-29 DIAGNOSIS — K8020 Calculus of gallbladder without cholecystitis without obstruction: Secondary | ICD-10-CM | POA: Diagnosis not present

## 2021-01-29 DIAGNOSIS — J984 Other disorders of lung: Secondary | ICD-10-CM | POA: Diagnosis not present

## 2021-01-29 MED ORDER — GADOBUTROL 1 MMOL/ML IV SOLN
8.0000 mL | Freq: Once | INTRAVENOUS | Status: AC | PRN
  Administered 2021-01-29: 8 mL via INTRAVENOUS

## 2021-01-29 MED ORDER — IOHEXOL 350 MG/ML SOLN
100.0000 mL | Freq: Once | INTRAVENOUS | Status: AC | PRN
  Administered 2021-01-29: 80 mL via INTRAVENOUS

## 2021-02-01 ENCOUNTER — Telehealth: Payer: Self-pay

## 2021-02-01 DIAGNOSIS — C50911 Malignant neoplasm of unspecified site of right female breast: Secondary | ICD-10-CM | POA: Diagnosis not present

## 2021-02-01 NOTE — Telephone Encounter (Signed)
-----   Message from [name], MD sent at 02/01/2021  6:22 AM EDT ----- Call - the MRI of the brain looks great!!  No obvious active cancer!! [name]

## 2021-02-01 NOTE — Telephone Encounter (Signed)
Called and informed patient of scan results, patient verbalized understanding and denies any questions or concerns at this time.   

## 2021-02-03 ENCOUNTER — Inpatient Hospital Stay: Payer: BC Managed Care – PPO

## 2021-02-03 ENCOUNTER — Encounter: Payer: Self-pay | Admitting: Hematology & Oncology

## 2021-02-03 ENCOUNTER — Other Ambulatory Visit: Payer: Self-pay

## 2021-02-03 ENCOUNTER — Inpatient Hospital Stay (HOSPITAL_BASED_OUTPATIENT_CLINIC_OR_DEPARTMENT_OTHER): Payer: BC Managed Care – PPO | Admitting: Hematology & Oncology

## 2021-02-03 ENCOUNTER — Inpatient Hospital Stay: Payer: BC Managed Care – PPO | Attending: Hematology & Oncology

## 2021-02-03 VITALS — BP 112/79 | HR 77 | Temp 98.0°F | Resp 17 | Wt 177.0 lb

## 2021-02-03 DIAGNOSIS — C50919 Malignant neoplasm of unspecified site of unspecified female breast: Secondary | ICD-10-CM

## 2021-02-03 DIAGNOSIS — C7951 Secondary malignant neoplasm of bone: Secondary | ICD-10-CM | POA: Insufficient documentation

## 2021-02-03 DIAGNOSIS — Z79818 Long term (current) use of other agents affecting estrogen receptors and estrogen levels: Secondary | ICD-10-CM | POA: Insufficient documentation

## 2021-02-03 DIAGNOSIS — Z170 Estrogen receptor positive status [ER+]: Secondary | ICD-10-CM | POA: Insufficient documentation

## 2021-02-03 DIAGNOSIS — Z79899 Other long term (current) drug therapy: Secondary | ICD-10-CM | POA: Diagnosis not present

## 2021-02-03 LAB — CBC WITH DIFFERENTIAL (CANCER CENTER ONLY)
Abs Immature Granulocytes: 0 10*3/uL (ref 0.00–0.07)
Basophils Absolute: 0 10*3/uL (ref 0.0–0.1)
Basophils Relative: 1 %
Eosinophils Absolute: 0 10*3/uL (ref 0.0–0.5)
Eosinophils Relative: 1 %
HCT: 34.6 % — ABNORMAL LOW (ref 36.0–46.0)
Hemoglobin: 12.4 g/dL (ref 12.0–15.0)
Immature Granulocytes: 0 %
Lymphocytes Relative: 35 %
Lymphs Abs: 0.8 10*3/uL (ref 0.7–4.0)
MCH: 37.8 pg — ABNORMAL HIGH (ref 26.0–34.0)
MCHC: 35.8 g/dL (ref 30.0–36.0)
MCV: 105.5 fL — ABNORMAL HIGH (ref 80.0–100.0)
Monocytes Absolute: 0.3 10*3/uL (ref 0.1–1.0)
Monocytes Relative: 14 %
Neutro Abs: 1.1 10*3/uL — ABNORMAL LOW (ref 1.7–7.7)
Neutrophils Relative %: 49 %
Platelet Count: 138 10*3/uL — ABNORMAL LOW (ref 150–400)
RBC: 3.28 MIL/uL — ABNORMAL LOW (ref 3.87–5.11)
RDW: 13.7 % (ref 11.5–15.5)
WBC Count: 2.3 10*3/uL — ABNORMAL LOW (ref 4.0–10.5)
nRBC: 0 % (ref 0.0–0.2)

## 2021-02-03 LAB — LACTATE DEHYDROGENASE: LDH: 198 U/L — ABNORMAL HIGH (ref 98–192)

## 2021-02-03 LAB — CMP (CANCER CENTER ONLY)
ALT: 11 U/L (ref 0–44)
AST: 12 U/L — ABNORMAL LOW (ref 15–41)
Albumin: 4.4 g/dL (ref 3.5–5.0)
Alkaline Phosphatase: 49 U/L (ref 38–126)
Anion gap: 7 (ref 5–15)
BUN: 19 mg/dL (ref 6–20)
CO2: 27 mmol/L (ref 22–32)
Calcium: 9.3 mg/dL (ref 8.9–10.3)
Chloride: 106 mmol/L (ref 98–111)
Creatinine: 0.92 mg/dL (ref 0.44–1.00)
GFR, Estimated: >60 mL/min (ref >60)
Glucose, Bld: 83 mg/dL (ref 70–99)
Potassium: 4.3 mmol/L (ref 3.5–5.1)
Sodium: 140 mmol/L (ref 135–145)
Total Bilirubin: 0.5 mg/dL (ref 0.3–1.2)
Total Protein: 6.9 g/dL (ref 6.5–8.1)

## 2021-02-03 MED ORDER — FULVESTRANT 250 MG/5ML IM SOLN
500.0000 mg | INTRAMUSCULAR | Status: DC
  Administered 2021-02-03: 500 mg via INTRAMUSCULAR
  Filled 2021-02-03: qty 10

## 2021-02-03 NOTE — Progress Notes (Signed)
Hematology and Oncology Follow Up Visit  Caitlin Greene [id number] 29-Aug-1965 55 y.o. 02/03/2021   Principle Diagnosis:  Metastatic breast cancer-ER positive/HER-2 negative --bone metastasis only  Current Therapy:   Faslodex 500 mg IM monthly --start on 04/2020 Ibrance 100 mg p.o. daily (21d on/7d off) - start on 04/2020 Xgeva 120 mg subcu every 3 months -next dose 02/2021     Interim History:  Caitlin Greene is in for follow-up.  She looks great.  She feels good.  She had her birthday over Labor Day weekend.  She had a wonderful time.  As always, her sister is took her out.  She did have scans done.  She had the MRI of the brain done.  It showed a stable lesion in the clivus which was not enhancing on MRI.  It was felt this might actually be a benign cystic lesion.  She had a CT scan of the body.  This did not show any visceral organ metastasis.  She had the bony metastasis.  Everything looks stable.  Her last CA 27.29 was 52.  This was up a little bit.  We will let we will have to watch this closely.  She is doing well with pain control.  She is on OxyContin and oxycodone for pain and this does seem to be helping her.  She has had no change in bowel or bladder habits.  She has had no issues with headaches.  There is been no visual changes.  She has had no cough or shortness of breath.  She has had no bleeding.  Is been no rashes.  She has had no leg swelling.  Overall, her performance status is ECOG 1.     Medications:  Current Outpatient Medications:    acetaminophen (TYLENOL) 650 MG CR tablet, Take 650 mg by mouth every 8 (eight) hours as needed for pain., Disp: , Rfl:    CALCIUM PO, Take 1 tablet by mouth daily., Disp: , Rfl:    famciclovir (FAMVIR) 250 MG tablet, Take 1 tablet (250 mg total) by mouth daily. (Patient not taking: No sig reported), Disp: 30 tablet, Rfl: 6   FLUoxetine (PROZAC) 20 MG capsule, Take 3 capsules (60 mg total) by mouth daily., Disp: 270 capsule, Rfl: 1    hydrOXYzine (ATARAX/VISTARIL) 25 MG tablet, Take 25 mg by mouth 3 (three) times daily as needed for anxiety., Disp: , Rfl:    IBRANCE 100 MG tablet, Take 100 mg by mouth See admin instructions. 170m daily on day 1-21 of cycle. 7 days off.  Repeat., Disp: , Rfl:    omeprazole (PRILOSEC) 20 MG capsule, Take 20 mg by mouth daily., Disp: , Rfl:    ondansetron (ZOFRAN) 4 MG tablet, Take 1 tablet (4 mg total) by mouth every 4 (four) hours as needed., Disp: 20 tablet, Rfl: 0   oxyCODONE (OXY IR/ROXICODONE) 5 MG immediate release tablet, Take 1 tablet (5 mg total) by mouth every 8 (eight) hours as needed for severe pain., Disp: 50 tablet, Rfl: 0   polyethylene glycol (MIRALAX / GLYCOLAX) 17 g packet, Take 17 g by mouth daily as needed., Disp: 28 each, Rfl: 0   SM SENNA LAXATIVE 8.6 MG tablet, Take 8.6 mg by mouth daily., Disp: , Rfl:    sucralfate (CARAFATE) 1 GM/10ML suspension, Take 10 mLs (1 g total) by mouth 4 (four) times daily -  with meals and at bedtime., Disp: 420 mL, Rfl: 6   vitamin B-12 (CYANOCOBALAMIN) 1000 MCG tablet, Take 1 tablet (1,000  mcg total) by mouth daily., Disp: 30 tablet, Rfl: 1 No current facility-administered medications for this visit.  Facility-Administered Medications Ordered in Other Visits:    fulvestrant (FASLODEX) injection 500 mg, 500 mg, Intramuscular, Q30 days, Anaalicia Reimann, Rudell Cobb, MD  Allergies:  Allergies  Allergen Reactions   Amoxicillin-Pot Clavulanate Hives and Itching   Penicillins Hives, Itching and Other (See Comments)   Codeine Nausea And Vomiting    Needs pre-meds Needs pre-meds Needs pre-meds Needs pre-meds Needs pre-meds Needs pre-meds Needs pre-meds Needs pre-meds    Past Medical History, Surgical history, Social history, and Family History were reviewed and updated.  Review of Systems: Review of Systems  Constitutional:  Positive for appetite change.  HENT:  Negative.    Eyes: Negative.   Respiratory: Negative.    Cardiovascular:  Negative.   Gastrointestinal:  Positive for abdominal pain and nausea.  Endocrine: Negative.   Genitourinary: Negative.    Musculoskeletal:  Positive for arthralgias and back pain.  Skin: Negative.   Neurological:  Positive for dizziness.  Hematological: Negative.   Psychiatric/Behavioral: Negative.     Physical Exam:  weight is 177 lb (80.3 kg). Her oral temperature is 98 F (36.7 C). Her blood pressure is 112/79 and her pulse is 77. Her respiration is 17 and oxygen saturation is 100%.   Wt Readings from Last 3 Encounters:  02/03/21 177 lb (80.3 kg)  01/01/21 172 lb 12.8 oz (78.4 kg)  12/04/20 170 lb 1.3 oz (77.1 kg)    Physical Exam Vitals reviewed.  HENT:     Head: Normocephalic and atraumatic.  Eyes:     Pupils: Pupils are equal, round, and reactive to light.  Cardiovascular:     Rate and Rhythm: Normal rate and regular rhythm.     Heart sounds: Normal heart sounds.  Pulmonary:     Effort: Pulmonary effort is normal.     Breath sounds: Normal breath sounds.  Abdominal:     General: Bowel sounds are normal.     Palpations: Abdomen is soft.  Musculoskeletal:        General: No tenderness or deformity. Normal range of motion.     Cervical back: Normal range of motion.  Lymphadenopathy:     Cervical: No cervical adenopathy.  Skin:    General: Skin is warm and dry.     Findings: No erythema or rash.  Neurological:     Mental Status: She is alert and oriented to person, place, and time.  Psychiatric:        Behavior: Behavior normal.        Thought Content: Thought content normal.        Judgment: Judgment normal.     Lab Results  Component Value Date   WBC 2.3 (L) 02/03/2021   HGB 12.4 02/03/2021   HCT 34.6 (L) 02/03/2021   MCV 105.5 (H) 02/03/2021   PLT 138 (L) 02/03/2021     Chemistry      Component Value Date/Time   NA 140 02/03/2021 0831   K 4.3 02/03/2021 0831   CL 106 02/03/2021 0831   CO2 27 02/03/2021 0831   BUN 19 02/03/2021 0831    CREATININE 0.92 02/03/2021 0831   CREATININE 0.88 10/22/2019 1246      Component Value Date/Time   CALCIUM 9.3 02/03/2021 0831   ALKPHOS 49 02/03/2021 0831   AST 12 (L) 02/03/2021 0831   ALT 11 02/03/2021 0831   BILITOT 0.5 02/03/2021 0831      Impression and Plan:  Caitlin Greene is a very charming 55 year old postmenopausal female with metastatic breast cancer.  We actually had seen her many years ago with a carcinoma in situ.  She is the wife of one of our former patients who passed away from ocular melanoma.  I am happy that the scans really did not show any evidence of progressive disease.  We will have to see what the CA 27.29 shows.  Again, her quality of life is doing well.  She is back home by herself now.  She really is enjoying this.  We will plan for Faslodex today.  We will see her back in October, she will get both Faslodex and Ibrance.   [name], MD 9/28/20229:14 AM

## 2021-02-03 NOTE — Patient Instructions (Signed)
[facility] [place]  Discharge Instructions: Thank you for choosing [facility] to provide your oncology and hematology care.   If you have a lab appointment with the [facility], please go directly to the [facility] and check in at the registration area.  Wear comfortable clothing and clothing appropriate for easy access to any Portacath or PICC line.   We strive to give you quality time with your provider. You may need to reschedule your appointment if you arrive late (15 or more minutes).  Arriving late affects you and other patients whose appointments are after yours.  Also, if you miss three or more appointments without notifying the office, you may be dismissed from the clinic at the provider's discretion.      For prescription refill requests, have your pharmacy contact our office and allow 72 hours for refills to be completed.    Today you received the following chemotherapy and/or immunotherapy agents Faslodex.   To help prevent nausea and vomiting after your treatment, we encourage you to take your nausea medication as directed.  BELOW ARE SYMPTOMS THAT SHOULD BE REPORTED IMMEDIATELY: *FEVER GREATER THAN 100.4 F (38 C) OR HIGHER *CHILLS OR SWEATING *NAUSEA AND VOMITING THAT IS NOT CONTROLLED WITH YOUR NAUSEA MEDICATION *UNUSUAL SHORTNESS OF BREATH *UNUSUAL BRUISING OR BLEEDING *URINARY PROBLEMS (pain or burning when urinating, or frequent urination) *BOWEL PROBLEMS (unusual diarrhea, constipation, pain near the anus) TENDERNESS IN MOUTH AND THROAT WITH OR WITHOUT PRESENCE OF ULCERS (sore throat, sores in mouth, or a toothache) UNUSUAL RASH, SWELLING OR PAIN  UNUSUAL VAGINAL DISCHARGE OR ITCHING   Items with * indicate a potential emergency and should be followed up as soon as possible or go to the Emergency Department if any problems should occur.  Please show the CHEMOTHERAPY ALERT CARD or IMMUNOTHERAPY ALERT CARD at check-in to the  Emergency Department and triage nurse. Should you have questions after your visit or need to cancel or reschedule your appointment, please contact [facility]  [phone] and follow the prompts.  Office hours are 8:00 a.m. to 4:30 p.m. Monday - Friday. Please note that voicemails left after 4:00 p.m. may not be returned until the following business day.  We are closed weekends and major holidays. You have access to a nurse at all times for urgent questions. Please call the main number to the clinic [phone] and follow the prompts.  For any non-urgent questions, you may also contact your provider using MyChart. We now offer e-Visits for anyone 79 and older to request care online for non-urgent symptoms. For details visit [email or web address].   Also download the MyChart app! Go to the app store, search "MyChart", open the app, select , and log in with your MyChart username and password.  Due to Covid, a mask is required upon entering the hospital/clinic. If you do not have a mask, one will be given to you upon arrival. For doctor visits, patients may have 1 support person aged 21 or older with them. For treatment visits, patients cannot have anyone with them due to current Covid guidelines and our immunocompromised population.

## 2021-02-04 LAB — CANCER ANTIGEN 27.29: CA 27.29: 52.7 U/mL — ABNORMAL HIGH (ref 0.0–38.6)

## 2021-03-11 ENCOUNTER — Other Ambulatory Visit: Payer: Self-pay

## 2021-03-11 ENCOUNTER — Inpatient Hospital Stay: Payer: BC Managed Care – PPO

## 2021-03-11 ENCOUNTER — Encounter: Payer: Self-pay | Admitting: Hematology & Oncology

## 2021-03-11 ENCOUNTER — Inpatient Hospital Stay: Payer: BC Managed Care – PPO | Attending: Hematology & Oncology

## 2021-03-11 ENCOUNTER — Inpatient Hospital Stay (HOSPITAL_BASED_OUTPATIENT_CLINIC_OR_DEPARTMENT_OTHER): Payer: BC Managed Care – PPO | Admitting: Hematology & Oncology

## 2021-03-11 VITALS — BP 110/83 | HR 73 | Temp 98.4°F | Resp 18 | Ht 68.0 in | Wt 186.1 lb

## 2021-03-11 DIAGNOSIS — Z170 Estrogen receptor positive status [ER+]: Secondary | ICD-10-CM | POA: Diagnosis not present

## 2021-03-11 DIAGNOSIS — C7951 Secondary malignant neoplasm of bone: Secondary | ICD-10-CM | POA: Diagnosis not present

## 2021-03-11 DIAGNOSIS — C50919 Malignant neoplasm of unspecified site of unspecified female breast: Secondary | ICD-10-CM | POA: Diagnosis not present

## 2021-03-11 DIAGNOSIS — R0981 Nasal congestion: Secondary | ICD-10-CM | POA: Diagnosis not present

## 2021-03-11 DIAGNOSIS — M25552 Pain in left hip: Secondary | ICD-10-CM | POA: Insufficient documentation

## 2021-03-11 DIAGNOSIS — Z79818 Long term (current) use of other agents affecting estrogen receptors and estrogen levels: Secondary | ICD-10-CM | POA: Diagnosis not present

## 2021-03-11 DIAGNOSIS — M25551 Pain in right hip: Secondary | ICD-10-CM | POA: Insufficient documentation

## 2021-03-11 DIAGNOSIS — Z79899 Other long term (current) drug therapy: Secondary | ICD-10-CM | POA: Insufficient documentation

## 2021-03-11 LAB — CBC WITH DIFFERENTIAL (CANCER CENTER ONLY)
Abs Immature Granulocytes: 0.01 10*3/uL (ref 0.00–0.07)
Basophils Absolute: 0.1 10*3/uL (ref 0.0–0.1)
Basophils Relative: 2 %
Eosinophils Absolute: 0.1 10*3/uL (ref 0.0–0.5)
Eosinophils Relative: 2 %
HCT: 36.1 % (ref 36.0–46.0)
Hemoglobin: 12.7 g/dL (ref 12.0–15.0)
Immature Granulocytes: 0 %
Lymphocytes Relative: 41 %
Lymphs Abs: 1.5 10*3/uL (ref 0.7–4.0)
MCH: 38.1 pg — ABNORMAL HIGH (ref 26.0–34.0)
MCHC: 35.2 g/dL (ref 30.0–36.0)
MCV: 108.4 fL — ABNORMAL HIGH (ref 80.0–100.0)
Monocytes Absolute: 0.5 10*3/uL (ref 0.1–1.0)
Monocytes Relative: 14 %
Neutro Abs: 1.5 10*3/uL — ABNORMAL LOW (ref 1.7–7.7)
Neutrophils Relative %: 41 %
Platelet Count: 185 10*3/uL (ref 150–400)
RBC: 3.33 MIL/uL — ABNORMAL LOW (ref 3.87–5.11)
RDW: 14 % (ref 11.5–15.5)
Smear Review: NORMAL
WBC Count: 3.6 10*3/uL — ABNORMAL LOW (ref 4.0–10.5)
nRBC: 0 % (ref 0.0–0.2)

## 2021-03-11 LAB — CMP (CANCER CENTER ONLY)
ALT: 14 U/L (ref 0–44)
AST: 16 U/L (ref 15–41)
Albumin: 4.2 g/dL (ref 3.5–5.0)
Alkaline Phosphatase: 64 U/L (ref 38–126)
Anion gap: 6 (ref 5–15)
BUN: 13 mg/dL (ref 6–20)
CO2: 29 mmol/L (ref 22–32)
Calcium: 9.6 mg/dL (ref 8.9–10.3)
Chloride: 105 mmol/L (ref 98–111)
Creatinine: 0.96 mg/dL (ref 0.44–1.00)
GFR, Estimated: >60 mL/min (ref >60)
Glucose, Bld: 89 mg/dL (ref 70–99)
Potassium: 4.1 mmol/L (ref 3.5–5.1)
Sodium: 140 mmol/L (ref 135–145)
Total Bilirubin: 0.4 mg/dL (ref 0.3–1.2)
Total Protein: 6.7 g/dL (ref 6.5–8.1)

## 2021-03-11 MED ORDER — DENOSUMAB 120 MG/1.7ML ~~LOC~~ SOLN
120.0000 mg | Freq: Once | SUBCUTANEOUS | Status: AC
  Administered 2021-03-11: 120 mg via SUBCUTANEOUS
  Filled 2021-03-11: qty 1.7

## 2021-03-11 MED ORDER — FULVESTRANT 250 MG/5ML IM SOSY
500.0000 mg | PREFILLED_SYRINGE | INTRAMUSCULAR | Status: DC
  Administered 2021-03-11: 500 mg via INTRAMUSCULAR
  Filled 2021-03-11: qty 10

## 2021-03-11 NOTE — Patient Instructions (Signed)
[facility] [place]  Discharge Instructions: Thank you for choosing [facility] to provide your oncology and hematology care.   If you have a lab appointment with the [facility], please go directly to the [facility] and check in at the registration area.  Wear comfortable clothing and clothing appropriate for easy access to any Portacath or PICC line.   We strive to give you quality time with your provider. You may need to reschedule your appointment if you arrive late (15 or more minutes).  Arriving late affects you and other patients whose appointments are after yours.  Also, if you miss three or more appointments without notifying the office, you may be dismissed from the clinic at the provider's discretion.      For prescription refill requests, have your pharmacy contact our office and allow 72 hours for refills to be completed.    Today you received the following chemotherapy and/or immunotherapy agents Faslodex and Xgeva   To help prevent nausea and vomiting after your treatment, we encourage you to take your nausea medication as directed.  BELOW ARE SYMPTOMS THAT SHOULD BE REPORTED IMMEDIATELY: *FEVER GREATER THAN 100.4 F (38 C) OR HIGHER *CHILLS OR SWEATING *NAUSEA AND VOMITING THAT IS NOT CONTROLLED WITH YOUR NAUSEA MEDICATION *UNUSUAL SHORTNESS OF BREATH *UNUSUAL BRUISING OR BLEEDING *URINARY PROBLEMS (pain or burning when urinating, or frequent urination) *BOWEL PROBLEMS (unusual diarrhea, constipation, pain near the anus) TENDERNESS IN MOUTH AND THROAT WITH OR WITHOUT PRESENCE OF ULCERS (sore throat, sores in mouth, or a toothache) UNUSUAL RASH, SWELLING OR PAIN  UNUSUAL VAGINAL DISCHARGE OR ITCHING   Items with * indicate a potential emergency and should be followed up as soon as possible or go to the Emergency Department if any problems should occur.  Please show the CHEMOTHERAPY ALERT CARD or IMMUNOTHERAPY ALERT CARD at check-in to  the Emergency Department and triage nurse. Should you have questions after your visit or need to cancel or reschedule your appointment, please contact [facility]  [phone] and follow the prompts.  Office hours are 8:00 a.m. to 4:30 p.m. Monday - Friday. Please note that voicemails left after 4:00 p.m. may not be returned until the following business day.  We are closed weekends and major holidays. You have access to a nurse at all times for urgent questions. Please call the main number to the clinic [phone] and follow the prompts.  For any non-urgent questions, you may also contact your provider using MyChart. We now offer e-Visits for anyone 56 and older to request care online for non-urgent symptoms. For details visit [email or web address].   Also download the MyChart app! Go to the app store, search "MyChart", open the app, select [facility], and log in with your MyChart username and password.  Due to Covid, a mask is required upon entering the hospital/clinic. If you do not have a mask, one will be given to you upon arrival. For doctor visits, patients may have 1 support person aged 6 or older with them. For treatment visits, patients cannot have anyone with them due to current Covid guidelines and our immunocompromised population.

## 2021-03-11 NOTE — Progress Notes (Signed)
Hematology and Oncology Follow Up Visit  Caitlin Greene [id number] 02-03-1966 55 y.o. 03/11/2021   Principle Diagnosis:  Metastatic breast cancer-ER positive/HER-2 negative --bone metastasis only  Current Therapy:   Faslodex 500 mg IM monthly --start on 04/2020 Ibrance 100 mg p.o. daily (21d on/7d off) - start on 04/2020 Xgeva 120 mg subcu every 3 months -next dose 01//2023     Interim History:  Caitlin Greene is in for follow-up.  She is doing quite nicely.  She really has no specific complaints.  She does have little bit of sinus congestion.  I told that she could certainly start Zyrtec again.  She has taken Zyrtec in the past.  She has little bit of hip pain bilaterally.  She does walk.  She has a hip pain is not all that bad.  She is on OxyContin and oxycodone which do seem to help.  Her appetite has been quite good.  She probably will have a very nice Thanksgiving with her family.  She has had no problems with cough or shortness of breath.  She has had no nausea or vomiting.  Her last CA 27.29 was 53.  This is holding steady.  Para she is had no leg swelling.  She has had no bleeding.  She has had no rashes.  Patient is doing well with the Ibrance.  Overall, I would say her performance status is ECOG 1.      Medications:  Current Outpatient Medications:    acetaminophen (TYLENOL) 650 MG CR tablet, Take 650 mg by mouth every 8 (eight) hours as needed for pain., Disp: , Rfl:    famciclovir (FAMVIR) 250 MG tablet, Take 1 tablet (250 mg total) by mouth daily., Disp: 30 tablet, Rfl: 6   FLUoxetine (PROZAC) 20 MG capsule, Take 3 capsules (60 mg total) by mouth daily., Disp: 270 capsule, Rfl: 1   hydrOXYzine (ATARAX/VISTARIL) 25 MG tablet, Take 25 mg by mouth 3 (three) times daily as needed for anxiety., Disp: , Rfl:    IBRANCE 100 MG tablet, Take 100 mg by mouth See admin instructions. 156m daily on day 1-21 of cycle. 7 days off.  Repeat., Disp: , Rfl:    omeprazole (PRILOSEC) 20 MG  capsule, Take 20 mg by mouth daily., Disp: , Rfl:    ondansetron (ZOFRAN) 4 MG tablet, Take 1 tablet (4 mg total) by mouth every 4 (four) hours as needed., Disp: 20 tablet, Rfl: 0   oxyCODONE (OXY IR/ROXICODONE) 5 MG immediate release tablet, Take 1 tablet (5 mg total) by mouth every 8 (eight) hours as needed for severe pain., Disp: 50 tablet, Rfl: 0   polyethylene glycol (MIRALAX / GLYCOLAX) 17 g packet, Take 17 g by mouth daily as needed., Disp: 28 each, Rfl: 0   SM SENNA LAXATIVE 8.6 MG tablet, Take 8.6 mg by mouth daily., Disp: , Rfl:    sucralfate (CARAFATE) 1 GM/10ML suspension, Take 10 mLs (1 g total) by mouth 4 (four) times daily -  with meals and at bedtime., Disp: 420 mL, Rfl: 6   vitamin B-12 (CYANOCOBALAMIN) 1000 MCG tablet, Take 1 tablet (1,000 mcg total) by mouth daily., Disp: 30 tablet, Rfl: 1   CALCIUM PO, Take 1 tablet by mouth daily., Disp: , Rfl:   Allergies:  Allergies  Allergen Reactions   Amoxicillin-Pot Clavulanate Hives and Itching   Penicillins Hives, Itching and Other (See Comments)   Codeine Nausea And Vomiting    Needs pre-meds     Past Medical History, Surgical history,  Social history, and Family History were reviewed and updated.  Review of Systems: Review of Systems  Constitutional:  Positive for appetite change.  HENT:  Negative.    Eyes: Negative.   Respiratory: Negative.    Cardiovascular: Negative.   Gastrointestinal:  Positive for abdominal pain and nausea.  Endocrine: Negative.   Genitourinary: Negative.    Musculoskeletal:  Positive for arthralgias and back pain.  Skin: Negative.   Neurological:  Positive for dizziness.  Hematological: Negative.   Psychiatric/Behavioral: Negative.     Physical Exam:  height is 5' 8"  (1.727 m) and weight is 186 lb 1.3 oz (84.4 kg). Her oral temperature is 98.4 F (36.9 C). Her blood pressure is 110/83 and her pulse is 73. Her respiration is 18 and oxygen saturation is 100%.   Wt Readings from Last 3  Encounters:  03/11/21 186 lb 1.3 oz (84.4 kg)  02/03/21 177 lb (80.3 kg)  01/01/21 172 lb 12.8 oz (78.4 kg)    Physical Exam Vitals reviewed.  HENT:     Head: Normocephalic and atraumatic.  Eyes:     Pupils: Pupils are equal, round, and reactive to light.  Cardiovascular:     Rate and Rhythm: Normal rate and regular rhythm.     Heart sounds: Normal heart sounds.  Pulmonary:     Effort: Pulmonary effort is normal.     Breath sounds: Normal breath sounds.  Abdominal:     General: Bowel sounds are normal.     Palpations: Abdomen is soft.  Musculoskeletal:        General: No tenderness or deformity. Normal range of motion.     Cervical back: Normal range of motion.  Lymphadenopathy:     Cervical: No cervical adenopathy.  Skin:    General: Skin is warm and dry.     Findings: No erythema or rash.  Neurological:     Mental Status: She is alert and oriented to person, place, and time.  Psychiatric:        Behavior: Behavior normal.        Thought Content: Thought content normal.        Judgment: Judgment normal.     Lab Results  Component Value Date   WBC 3.6 (L) 03/11/2021   HGB 12.7 03/11/2021   HCT 36.1 03/11/2021   MCV 108.4 (H) 03/11/2021   PLT 185 03/11/2021     Chemistry      Component Value Date/Time   NA 140 03/11/2021 0811   K 4.1 03/11/2021 0811   CL 105 03/11/2021 0811   CO2 29 03/11/2021 0811   BUN 13 03/11/2021 0811   CREATININE 0.96 03/11/2021 0811   CREATININE 0.88 10/22/2019 1246      Component Value Date/Time   CALCIUM 9.6 03/11/2021 0811   ALKPHOS 64 03/11/2021 0811   AST 16 03/11/2021 0811   ALT 14 03/11/2021 0811   BILITOT 0.4 03/11/2021 0811      Impression and Plan: Caitlin Greene is a very charming 55 year old postmenopausal female with metastatic breast cancer.  We actually had seen her many years ago with a carcinoma in situ.  She is the wife of one of our former patients who passed away from ocular melanoma.  So far, she is doing  quite nicely.  Her quality life is doing well.  I am happy about this.  Today, she will get the Faslodex and the Xgeva.  We will see what the CA 27.29 is.  We will now get her back  after Thanksgiving.  We will probably plan for some scans in December.   [name], MD 11/3/20228:44 AM

## 2021-03-12 LAB — CANCER ANTIGEN 27.29: CA 27.29: 54 U/mL — ABNORMAL HIGH (ref 0.0–38.6)

## 2021-03-15 ENCOUNTER — Ambulatory Visit: Payer: BC Managed Care – PPO | Attending: Internal Medicine

## 2021-03-15 DIAGNOSIS — Z23 Encounter for immunization: Secondary | ICD-10-CM

## 2021-03-15 NOTE — Progress Notes (Signed)
   Covid-19 Vaccination Clinic  Name:  Caitlin Greene    MRN: [id number] DOB: 04-21-1966  03/15/2021  Ms. Caitlin Greene was observed post Covid-19 immunization for 15 minutes without incident. She was provided with Vaccine Information Sheet and instruction to access the V-Safe system.   Ms. Caitlin Greene was instructed to call 911 with any severe reactions post vaccine: Difficulty breathing  Swelling of face and throat  A fast heartbeat  A bad rash all over body  Dizziness and weakness   Immunizations Administered     Name Date Dose VIS Date Route   Pfizer Covid-19 Vaccine Bivalent Booster 03/15/2021  1:24 PM 0.3 mL 01/06/2021 Intramuscular   Manufacturer: [employer]   Lot: [id number]   [place]: [phone]

## 2021-03-25 ENCOUNTER — Other Ambulatory Visit: Payer: Self-pay | Admitting: *Deleted

## 2021-03-25 ENCOUNTER — Telehealth: Payer: Self-pay | Admitting: *Deleted

## 2021-03-25 MED ORDER — AZITHROMYCIN 250 MG PO TABS: ORAL_TABLET | ORAL | 0 refills | Status: DC

## 2021-03-25 NOTE — Telephone Encounter (Signed)
Received a call from patient that she has a stuffy nose with green mucus and chest congestion.  No fever.  Dr Marin Olp notified.  Zpack sent to patients pharmacy.  Patient notified

## 2021-03-30 ENCOUNTER — Encounter: Payer: Self-pay | Admitting: Hematology & Oncology

## 2021-03-30 ENCOUNTER — Other Ambulatory Visit (HOSPITAL_BASED_OUTPATIENT_CLINIC_OR_DEPARTMENT_OTHER): Payer: Self-pay

## 2021-03-30 MED ORDER — PFIZER COVID-19 VAC BIVALENT 30 MCG/0.3ML IM SUSP
INTRAMUSCULAR | 0 refills | Status: DC
Start: 2021-03-15 — End: 2021-04-05
  Filled 2021-03-30: qty 0.3, 1d supply, fill #0

## 2021-04-05 ENCOUNTER — Other Ambulatory Visit (HOSPITAL_BASED_OUTPATIENT_CLINIC_OR_DEPARTMENT_OTHER): Payer: Self-pay

## 2021-04-05 MED ORDER — PFIZER COVID-19 VAC BIVALENT 30 MCG/0.3ML IM SUSP
INTRAMUSCULAR | 0 refills | Status: DC
  Filled 2021-04-05: qty 0.3, 1d supply, fill #0

## 2021-04-15 ENCOUNTER — Telehealth: Payer: Self-pay | Admitting: *Deleted

## 2021-04-15 ENCOUNTER — Inpatient Hospital Stay (HOSPITAL_BASED_OUTPATIENT_CLINIC_OR_DEPARTMENT_OTHER): Payer: BC Managed Care – PPO | Admitting: Hematology & Oncology

## 2021-04-15 ENCOUNTER — Inpatient Hospital Stay: Payer: BC Managed Care – PPO | Attending: Hematology & Oncology

## 2021-04-15 ENCOUNTER — Other Ambulatory Visit: Payer: Self-pay

## 2021-04-15 ENCOUNTER — Inpatient Hospital Stay: Payer: BC Managed Care – PPO

## 2021-04-15 ENCOUNTER — Encounter: Payer: Self-pay | Admitting: Hematology & Oncology

## 2021-04-15 VITALS — BP 109/81 | HR 75 | Temp 98.4°F | Resp 19 | Wt 190.0 lb

## 2021-04-15 DIAGNOSIS — M25552 Pain in left hip: Secondary | ICD-10-CM | POA: Insufficient documentation

## 2021-04-15 DIAGNOSIS — C50919 Malignant neoplasm of unspecified site of unspecified female breast: Secondary | ICD-10-CM | POA: Diagnosis not present

## 2021-04-15 DIAGNOSIS — Z79899 Other long term (current) drug therapy: Secondary | ICD-10-CM | POA: Insufficient documentation

## 2021-04-15 DIAGNOSIS — C7951 Secondary malignant neoplasm of bone: Secondary | ICD-10-CM | POA: Insufficient documentation

## 2021-04-15 DIAGNOSIS — M25551 Pain in right hip: Secondary | ICD-10-CM | POA: Insufficient documentation

## 2021-04-15 DIAGNOSIS — Z170 Estrogen receptor positive status [ER+]: Secondary | ICD-10-CM | POA: Insufficient documentation

## 2021-04-15 LAB — CBC WITH DIFFERENTIAL (CANCER CENTER ONLY)
Abs Immature Granulocytes: 0.01 10*3/uL (ref 0.00–0.07)
Basophils Absolute: 0.1 10*3/uL (ref 0.0–0.1)
Basophils Relative: 3 %
Eosinophils Absolute: 0.1 10*3/uL (ref 0.0–0.5)
Eosinophils Relative: 2 %
HCT: 36 % (ref 36.0–46.0)
Hemoglobin: 12.4 g/dL (ref 12.0–15.0)
Immature Granulocytes: 0 %
Lymphocytes Relative: 36 %
Lymphs Abs: 1.1 10*3/uL (ref 0.7–4.0)
MCH: 37.2 pg — ABNORMAL HIGH (ref 26.0–34.0)
MCHC: 34.4 g/dL (ref 30.0–36.0)
MCV: 108.1 fL — ABNORMAL HIGH (ref 80.0–100.0)
Monocytes Absolute: 0.5 10*3/uL (ref 0.1–1.0)
Monocytes Relative: 18 %
Neutro Abs: 1.2 10*3/uL — ABNORMAL LOW (ref 1.7–7.7)
Neutrophils Relative %: 41 %
Platelet Count: 179 10*3/uL (ref 150–400)
RBC: 3.33 MIL/uL — ABNORMAL LOW (ref 3.87–5.11)
RDW: 13.2 % (ref 11.5–15.5)
WBC Count: 2.9 10*3/uL — ABNORMAL LOW (ref 4.0–10.5)
nRBC: 0 % (ref 0.0–0.2)

## 2021-04-15 LAB — CMP (CANCER CENTER ONLY)
ALT: 14 U/L (ref 0–44)
AST: 13 U/L — ABNORMAL LOW (ref 15–41)
Albumin: 4.1 g/dL (ref 3.5–5.0)
Alkaline Phosphatase: 50 U/L (ref 38–126)
Anion gap: 5 (ref 5–15)
BUN: 17 mg/dL (ref 6–20)
CO2: 29 mmol/L (ref 22–32)
Calcium: 9.2 mg/dL (ref 8.9–10.3)
Chloride: 105 mmol/L (ref 98–111)
Creatinine: 0.98 mg/dL (ref 0.44–1.00)
GFR, Estimated: >60 mL/min (ref >60)
Glucose, Bld: 95 mg/dL (ref 70–99)
Potassium: 4.6 mmol/L (ref 3.5–5.1)
Sodium: 139 mmol/L (ref 135–145)
Total Bilirubin: 0.5 mg/dL (ref 0.3–1.2)
Total Protein: 6.5 g/dL (ref 6.5–8.1)

## 2021-04-15 LAB — LACTATE DEHYDROGENASE: LDH: 175 U/L (ref 98–192)

## 2021-04-15 MED ORDER — FULVESTRANT 250 MG/5ML IM SOSY
500.0000 mg | PREFILLED_SYRINGE | INTRAMUSCULAR | Status: DC
  Administered 2021-04-15: 500 mg via INTRAMUSCULAR
  Filled 2021-04-15: qty 10

## 2021-04-15 NOTE — Progress Notes (Signed)
Hematology and Oncology Follow Up Visit  Caitlin Greene [id number] 08/14/1965 55 y.o. 04/15/2021   Principle Diagnosis:  Metastatic breast cancer-ER positive/HER-2 negative --bone metastasis only  Current Therapy:   Faslodex 500 mg IM monthly --start on 04/2020 Ibrance 100 mg p.o. daily (21d on/7d off) - start on 04/2020 Xgeva 120 mg subcu every 3 months -next dose 01//2023     Interim History:  Caitlin Greene is in for follow-up.  She had a wonderful Thanksgiving.  Happy that she was able to enjoy Thanksgiving with her family.  She ate quite a bit.  Her weight is up a little bit..  She is having a little bit of the pain in the hips bilaterally.  This seems to get better when she moves around more.  She says it is better when she is in the sun.  She is on OxyContin and oxycodone.  Her CA 27.29 was 54 we last saw her.  This is holding steady.  She has had no change in bowel or bladder habits.  She has had no cough or shortness of breath.  She has had a little bit of sinus congestion.  There is been no leg swelling.  She has had no rashes.  There has been no bleeding.  Overall, I would say her performance status is ECOG 1.    Medications:  Current Outpatient Medications:    acetaminophen (TYLENOL) 650 MG CR tablet, Take 650 mg by mouth every 8 (eight) hours as needed for pain., Disp: , Rfl:    azithromycin (ZITHROMAX Z-PAK) 250 MG tablet, Take as directed, Disp: 6 each, Rfl: 0   CALCIUM PO, Take 1 tablet by mouth daily., Disp: , Rfl:    COVID-19 mRNA bivalent vaccine, Pfizer, (PFIZER COVID-19 VAC BIVALENT) injection, Inject into the muscle., Disp: 0.3 mL, Rfl: 0   famciclovir (FAMVIR) 250 MG tablet, Take 1 tablet (250 mg total) by mouth daily., Disp: 30 tablet, Rfl: 6   FLUoxetine (PROZAC) 20 MG capsule, Take 3 capsules (60 mg total) by mouth daily., Disp: 270 capsule, Rfl: 1   hydrOXYzine (ATARAX/VISTARIL) 25 MG tablet, Take 25 mg by mouth 3 (three) times daily as needed for anxiety.,  Disp: , Rfl:    IBRANCE 100 MG tablet, Take 100 mg by mouth See admin instructions. 19m daily on day 1-21 of cycle. 7 days off.  Repeat., Disp: , Rfl:    omeprazole (PRILOSEC) 20 MG capsule, Take 20 mg by mouth daily., Disp: , Rfl:    ondansetron (ZOFRAN) 4 MG tablet, Take 1 tablet (4 mg total) by mouth every 4 (four) hours as needed., Disp: 20 tablet, Rfl: 0   oxyCODONE (OXY IR/ROXICODONE) 5 MG immediate release tablet, Take 1 tablet (5 mg total) by mouth every 8 (eight) hours as needed for severe pain., Disp: 50 tablet, Rfl: 0   polyethylene glycol (MIRALAX / GLYCOLAX) 17 g packet, Take 17 g by mouth daily as needed., Disp: 28 each, Rfl: 0   SM SENNA LAXATIVE 8.6 MG tablet, Take 8.6 mg by mouth daily., Disp: , Rfl:    sucralfate (CARAFATE) 1 GM/10ML suspension, Take 10 mLs (1 g total) by mouth 4 (four) times daily -  with meals and at bedtime., Disp: 420 mL, Rfl: 6   vitamin B-12 (CYANOCOBALAMIN) 1000 MCG tablet, Take 1 tablet (1,000 mcg total) by mouth daily., Disp: 30 tablet, Rfl: 1 No current facility-administered medications for this visit.  Facility-Administered Medications Ordered in Other Visits:    fulvestrant (FASLODEX) injection 500 mg,  500 mg, Intramuscular, Q30 days, Caitlin Greene, Caitlin Cobb, MD  Allergies:  Allergies  Allergen Reactions   Amoxicillin-Pot Clavulanate Hives and Itching   Penicillins Hives, Itching and Other (See Comments)   Codeine Nausea And Vomiting    Needs pre-meds     Past Medical History, Surgical history, Social history, and Family History were reviewed and updated.  Review of Systems: Review of Systems  Constitutional:  Positive for appetite change.  HENT:  Negative.    Eyes: Negative.   Respiratory: Negative.    Cardiovascular: Negative.   Gastrointestinal:  Positive for abdominal pain and nausea.  Endocrine: Negative.   Genitourinary: Negative.    Musculoskeletal:  Positive for arthralgias and back pain.  Skin: Negative.   Neurological:   Positive for dizziness.  Hematological: Negative.   Psychiatric/Behavioral: Negative.     Physical Exam:  vitals were not taken for this visit.   Wt Readings from Last 3 Encounters:  03/11/21 186 lb 1.3 oz (84.4 kg)  02/03/21 177 lb (80.3 kg)  01/01/21 172 lb 12.8 oz (78.4 kg)    Physical Exam Vitals reviewed.  HENT:     Head: Normocephalic and atraumatic.  Eyes:     Pupils: Pupils are equal, round, and reactive to light.  Cardiovascular:     Rate and Rhythm: Normal rate and regular rhythm.     Heart sounds: Normal heart sounds.  Pulmonary:     Effort: Pulmonary effort is normal.     Breath sounds: Normal breath sounds.  Abdominal:     General: Bowel sounds are normal.     Palpations: Abdomen is soft.  Musculoskeletal:        General: No tenderness or deformity. Normal range of motion.     Cervical back: Normal range of motion.  Lymphadenopathy:     Cervical: No cervical adenopathy.  Skin:    General: Skin is warm and dry.     Findings: No erythema or rash.  Neurological:     Mental Status: She is alert and oriented to person, place, and time.  Psychiatric:        Behavior: Behavior normal.        Thought Content: Thought content normal.        Judgment: Judgment normal.     Lab Results  Component Value Date   WBC 2.9 (L) 04/15/2021   HGB 12.4 04/15/2021   HCT 36.0 04/15/2021   MCV 108.1 (H) 04/15/2021   PLT 179 04/15/2021     Chemistry      Component Value Date/Time   NA 139 04/15/2021 0912   K 4.6 04/15/2021 0912   CL 105 04/15/2021 0912   CO2 29 04/15/2021 0912   BUN 17 04/15/2021 0912   CREATININE 0.98 04/15/2021 0912   CREATININE 0.88 10/22/2019 1246      Component Value Date/Time   CALCIUM 9.2 04/15/2021 0912   ALKPHOS 50 04/15/2021 0912   AST 13 (L) 04/15/2021 0912   ALT 14 04/15/2021 0912   BILITOT 0.5 04/15/2021 0912      Impression and Plan: Caitlin Greene is a very charming 55 year old postmenopausal female with metastatic breast  cancer.  We actually had seen her many years ago with a carcinoma in situ.  She is the wife of one of our former patients who passed away from ocular melanoma.  We will have to see how erythematous with her scans.  I will set her up with scans right after Christmas.  Seems like she is doing pretty  good with her quality of life.  I am happy about that.  Hopefully, the scans will look okay.  Its been about a year since we have had her on antiestrogen therapy.  I will plan to see her back in early January.     [name], MD 12/8/202210:20 AM

## 2021-04-15 NOTE — Patient Instructions (Signed)
Fulvestrant injection °What is this medication? °FULVESTRANT (ful VES trant) blocks the effects of estrogen. It is used to treat breast cancer. °This medicine may be used for other purposes; ask your health care provider or pharmacist if you have questions. °COMMON BRAND NAME(S): FASLODEX °What should I tell my care team before I take this medication? °They need to know if you have any of these conditions: °bleeding disorders °liver disease °low blood counts, like low white cell, platelet, or red cell counts °an unusual or allergic reaction to fulvestrant, other medicines, foods, dyes, or preservatives °pregnant or trying to get pregnant °breast-feeding °How should I use this medication? °This medicine is for injection into a muscle. It is usually given by a health care professional in a hospital or clinic setting. °Talk to your pediatrician regarding the use of this medicine in children. Special care may be needed. °Overdosage: If you think you have taken too much of this medicine contact a poison control center or emergency room at once. °NOTE: This medicine is only for you. Do not share this medicine with others. °What if I miss a dose? °It is important not to miss your dose. Call your doctor or health care professional if you are unable to keep an appointment. °What may interact with this medication? °medicines that treat or prevent blood clots like warfarin, enoxaparin, dalteparin, apixaban, dabigatran, and rivaroxaban °This list may not describe all possible interactions. Give your health care provider a list of all the medicines, herbs, non-prescription drugs, or dietary supplements you use. Also tell them if you smoke, drink alcohol, or use illegal drugs. Some items may interact with your medicine. °What should I watch for while using this medication? °Your condition will be monitored carefully while you are receiving this medicine. You will need important blood work done while you are taking this  medicine. °Do not become pregnant while taking this medicine or for at least 1 year after stopping it. Women of child-bearing potential will need to have a negative pregnancy test before starting this medicine. Women should inform their doctor if they wish to become pregnant or think they might be pregnant. There is a potential for serious side effects to an unborn child. Men should inform their doctors if they wish to father a child. This medicine may lower sperm counts. Talk to your health care professional or pharmacist for more information. Do not breast-feed an infant while taking this medicine or for 1 year after the last dose. °What side effects may I notice from receiving this medication? °Side effects that you should report to your doctor or health care professional as soon as possible: °allergic reactions like skin rash, itching or hives, swelling of the face, lips, or tongue °feeling faint or lightheaded, falls °pain, tingling, numbness, or weakness in the legs °signs and symptoms of infection like fever or chills; cough; flu-like symptoms; sore throat °vaginal bleeding °Side effects that usually do not require medical attention (report to your doctor or health care professional if they continue or are bothersome): °aches, pains °constipation °diarrhea °headache °hot flashes °nausea, vomiting °pain at site where injected °stomach pain °This list may not describe all possible side effects. Call your doctor for medical advice about side effects. You may report side effects to FDA at 1-800-FDA-1088. °Where should I keep my medication? °This drug is given in a hospital or clinic and will not be stored at home. °NOTE: This sheet is a summary. It may not cover all possible information. If you have   questions about this medicine, talk to your doctor, pharmacist, or health care provider. °© 2022 [employer] (2017-08-08 00:00:00) ° °

## 2021-04-15 NOTE — Telephone Encounter (Signed)
Per 04/15/21 los - gave upcoming appointments - confirmed

## 2021-04-16 LAB — CANCER ANTIGEN 27.29: CA 27.29: 47.1 U/mL — ABNORMAL HIGH (ref 0.0–38.6)

## 2021-04-29 ENCOUNTER — Encounter (HOSPITAL_COMMUNITY)
Admission: RE | Admit: 2021-04-29 | Discharge: 2021-04-29 | Disposition: A | Discharge status: Home / Self Care | Payer: BC Managed Care – PPO | Source: Ambulatory Visit | Attending: Hematology & Oncology | Admitting: Hematology & Oncology

## 2021-04-29 ENCOUNTER — Other Ambulatory Visit: Payer: Self-pay

## 2021-04-29 ENCOUNTER — Ambulatory Visit (HOSPITAL_COMMUNITY)
Admission: RE | Admit: 2021-04-29 | Discharge: 2021-04-29 | Disposition: A | Discharge status: Home / Self Care | Payer: BC Managed Care – PPO | Source: Ambulatory Visit | Attending: Hematology & Oncology | Admitting: Hematology & Oncology

## 2021-04-29 DIAGNOSIS — K8020 Calculus of gallbladder without cholecystitis without obstruction: Secondary | ICD-10-CM | POA: Diagnosis not present

## 2021-04-29 DIAGNOSIS — C7951 Secondary malignant neoplasm of bone: Secondary | ICD-10-CM | POA: Diagnosis not present

## 2021-04-29 DIAGNOSIS — C50911 Malignant neoplasm of unspecified site of right female breast: Secondary | ICD-10-CM | POA: Diagnosis not present

## 2021-04-29 DIAGNOSIS — C50919 Malignant neoplasm of unspecified site of unspecified female breast: Secondary | ICD-10-CM | POA: Diagnosis not present

## 2021-04-29 DIAGNOSIS — K449 Diaphragmatic hernia without obstruction or gangrene: Secondary | ICD-10-CM | POA: Diagnosis not present

## 2021-04-29 DIAGNOSIS — S22070A Wedge compression fracture of T9-T10 vertebra, initial encounter for closed fracture: Secondary | ICD-10-CM | POA: Diagnosis not present

## 2021-04-29 DIAGNOSIS — K5730 Diverticulosis of large intestine without perforation or abscess without bleeding: Secondary | ICD-10-CM | POA: Diagnosis not present

## 2021-04-29 DIAGNOSIS — J9811 Atelectasis: Secondary | ICD-10-CM | POA: Diagnosis not present

## 2021-04-29 MED ORDER — IOHEXOL 350 MG/ML SOLN
80.0000 mL | Freq: Once | INTRAVENOUS | Status: AC | PRN
  Administered 2021-04-29: 12:00:00 80 mL via INTRAVENOUS

## 2021-04-29 MED ORDER — TECHNETIUM TC 99M MEDRONATE IV KIT
21.5000 | PACK | Freq: Once | INTRAVENOUS | Status: AC
  Administered 2021-04-29: 11:00:00 21.5 via INTRAVENOUS

## 2021-04-30 ENCOUNTER — Telehealth: Payer: Self-pay | Admitting: *Deleted

## 2021-04-30 ENCOUNTER — Encounter: Payer: Self-pay | Admitting: *Deleted

## 2021-04-30 ENCOUNTER — Other Ambulatory Visit: Payer: Self-pay | Admitting: Internal Medicine

## 2021-04-30 NOTE — Telephone Encounter (Signed)
Ok to contact pt  Prozac refilled x 1 mo  Please for ROV soon for further refills

## 2021-04-30 NOTE — Telephone Encounter (Signed)
-----   Message from Caitlin Napoleon, MD sent at 04/30/2021 12:46 PM EST ----- Call - the bone scan looks great!!!  Nothing is growing or new!!!  Merry Christmas!!  [name]

## 2021-04-30 NOTE — Telephone Encounter (Signed)
As noted below by Dr. Marin Olp, I informed the patient that the bone scan looks great! There is nothing new and or growing. She verbalized understanding.

## 2021-05-07 DIAGNOSIS — R0982 Postnasal drip: Secondary | ICD-10-CM | POA: Diagnosis not present

## 2021-05-07 DIAGNOSIS — Z20822 Contact with and (suspected) exposure to covid-19: Secondary | ICD-10-CM | POA: Diagnosis not present

## 2021-05-07 DIAGNOSIS — R110 Nausea: Secondary | ICD-10-CM | POA: Diagnosis not present

## 2021-05-13 ENCOUNTER — Inpatient Hospital Stay: Payer: BC Managed Care – PPO | Attending: Hematology & Oncology

## 2021-05-13 ENCOUNTER — Inpatient Hospital Stay: Payer: BC Managed Care – PPO | Admitting: Hematology & Oncology

## 2021-05-13 ENCOUNTER — Encounter: Payer: Self-pay | Admitting: Hematology & Oncology

## 2021-05-13 ENCOUNTER — Inpatient Hospital Stay: Payer: BC Managed Care – PPO

## 2021-05-13 ENCOUNTER — Other Ambulatory Visit: Payer: Self-pay

## 2021-05-13 VITALS — BP 109/84 | HR 102 | Temp 98.5°F | Resp 18 | Wt 191.0 lb

## 2021-05-13 DIAGNOSIS — Z79818 Long term (current) use of other agents affecting estrogen receptors and estrogen levels: Secondary | ICD-10-CM | POA: Diagnosis not present

## 2021-05-13 DIAGNOSIS — Z79899 Other long term (current) drug therapy: Secondary | ICD-10-CM | POA: Diagnosis not present

## 2021-05-13 DIAGNOSIS — M25551 Pain in right hip: Secondary | ICD-10-CM | POA: Insufficient documentation

## 2021-05-13 DIAGNOSIS — C50919 Malignant neoplasm of unspecified site of unspecified female breast: Secondary | ICD-10-CM | POA: Insufficient documentation

## 2021-05-13 DIAGNOSIS — M25552 Pain in left hip: Secondary | ICD-10-CM | POA: Insufficient documentation

## 2021-05-13 DIAGNOSIS — Z170 Estrogen receptor positive status [ER+]: Secondary | ICD-10-CM | POA: Insufficient documentation

## 2021-05-13 DIAGNOSIS — C7951 Secondary malignant neoplasm of bone: Secondary | ICD-10-CM | POA: Diagnosis not present

## 2021-05-13 LAB — CBC WITH DIFFERENTIAL (CANCER CENTER ONLY)
Abs Immature Granulocytes: 0.03 10*3/uL (ref 0.00–0.07)
Basophils Absolute: 0.1 10*3/uL (ref 0.0–0.1)
Basophils Relative: 2 %
Eosinophils Absolute: 0 10*3/uL (ref 0.0–0.5)
Eosinophils Relative: 1 %
HCT: 37.2 % (ref 36.0–46.0)
Hemoglobin: 13.1 g/dL (ref 12.0–15.0)
Immature Granulocytes: 1 %
Lymphocytes Relative: 31 %
Lymphs Abs: 1.5 10*3/uL (ref 0.7–4.0)
MCH: 37 pg — ABNORMAL HIGH (ref 26.0–34.0)
MCHC: 35.2 g/dL (ref 30.0–36.0)
MCV: 105.1 fL — ABNORMAL HIGH (ref 80.0–100.0)
Monocytes Absolute: 0.7 10*3/uL (ref 0.1–1.0)
Monocytes Relative: 15 %
Neutro Abs: 2.5 10*3/uL (ref 1.7–7.7)
Neutrophils Relative %: 50 %
Platelet Count: 265 10*3/uL (ref 150–400)
RBC: 3.54 MIL/uL — ABNORMAL LOW (ref 3.87–5.11)
RDW: 13 % (ref 11.5–15.5)
Smear Review: NORMAL
WBC Count: 4.8 10*3/uL (ref 4.0–10.5)
nRBC: 0 % (ref 0.0–0.2)

## 2021-05-13 LAB — CMP (CANCER CENTER ONLY)
ALT: 20 U/L (ref 0–44)
AST: 19 U/L (ref 15–41)
Albumin: 4.2 g/dL (ref 3.5–5.0)
Alkaline Phosphatase: 59 U/L (ref 38–126)
Anion gap: 8 (ref 5–15)
BUN: 16 mg/dL (ref 6–20)
CO2: 26 mmol/L (ref 22–32)
Calcium: 9.7 mg/dL (ref 8.9–10.3)
Chloride: 104 mmol/L (ref 98–111)
Creatinine: 0.98 mg/dL (ref 0.44–1.00)
GFR, Estimated: >60 mL/min (ref >60)
Glucose, Bld: 83 mg/dL (ref 70–99)
Potassium: 4.1 mmol/L (ref 3.5–5.1)
Sodium: 138 mmol/L (ref 135–145)
Total Bilirubin: 0.4 mg/dL (ref 0.3–1.2)
Total Protein: 6.7 g/dL (ref 6.5–8.1)

## 2021-05-13 MED ORDER — DENOSUMAB 120 MG/1.7ML ~~LOC~~ SOLN
120.0000 mg | Freq: Once | SUBCUTANEOUS | Status: AC
  Administered 2021-05-13: 120 mg via SUBCUTANEOUS
  Filled 2021-05-13: qty 1.7

## 2021-05-13 MED ORDER — DENOSUMAB 120 MG/1.7ML ~~LOC~~ SOLN: 120.0000 mg | Freq: Once | SUBCUTANEOUS | Status: DC

## 2021-05-13 MED ORDER — FULVESTRANT 250 MG/5ML IM SOSY
500.0000 mg | PREFILLED_SYRINGE | INTRAMUSCULAR | Status: DC
  Administered 2021-05-13: 500 mg via INTRAMUSCULAR
  Filled 2021-05-13: qty 10

## 2021-05-13 NOTE — Progress Notes (Signed)
Per MD give xgeva early

## 2021-05-13 NOTE — Progress Notes (Signed)
Hematology and Oncology Follow Up Visit  Sagan Wurzel [id number] December 10, 1965 56 y.o. 05/13/2021   Principle Diagnosis:  Metastatic breast cancer-ER positive/HER-2 negative --bone metastasis only  Current Therapy:   Faslodex 500 mg IM monthly --start on 04/2020 Ibrance 100 mg p.o. daily (21d on/7d off) - start on 04/2020 Xgeva 120 mg subcu every 3 months -next dose 04//2023     Interim History:  Ms. Erlandson is in for follow-up.  She had a wonderful Christmas and New Year's.  She was with her family.  She is gone on to be with a sister today.  We did do radiographic studies on her back in December.  Everything showed that she has stable disease.  She has a adrenal adenoma which I do think is active.  Patient is complaining of hip pain bilaterally.  I am unsure if the bone scan really shows anything over there.  I suppose we could see if radiation therapy might consider her for some palliative radiation to the hips.  Her last CA 27.29 was holding steady at 47.  She has had no problems with nausea or vomiting.  There is been no issues with bowels or bladder.  She has had no headache.    She tolerated treatment quite nicely.  Overall, I would say performance status is ECOG 1.    Medications:  Current Outpatient Medications:    acetaminophen (TYLENOL) 650 MG CR tablet, Take 650 mg by mouth every 8 (eight) hours as needed for pain., Disp: , Rfl:    CALCIUM PO, Take 1 tablet by mouth daily., Disp: , Rfl:    COVID-19 mRNA bivalent vaccine, Pfizer, (PFIZER COVID-19 VAC BIVALENT) injection, Inject into the muscle., Disp: 0.3 mL, Rfl: 0   famciclovir (FAMVIR) 250 MG tablet, Take 1 tablet (250 mg total) by mouth daily., Disp: 30 tablet, Rfl: 6   FLUoxetine (PROZAC) 20 MG capsule, TAKE 3 CAPSULES(60 MG) BY MOUTH DAILY, Disp: 60 capsule, Rfl: 0   hydrOXYzine (ATARAX/VISTARIL) 25 MG tablet, Take 25 mg by mouth 3 (three) times daily as needed for anxiety., Disp: , Rfl:    IBRANCE 100 MG tablet, Take  100 mg by mouth See admin instructions. 165m daily on day 1-21 of cycle. 7 days off.  Repeat., Disp: , Rfl:    omeprazole (PRILOSEC) 20 MG capsule, Take 20 mg by mouth daily., Disp: , Rfl:    ondansetron (ZOFRAN) 4 MG tablet, Take 1 tablet (4 mg total) by mouth every 4 (four) hours as needed., Disp: 20 tablet, Rfl: 0   oxyCODONE (OXY IR/ROXICODONE) 5 MG immediate release tablet, Take 1 tablet (5 mg total) by mouth every 8 (eight) hours as needed for severe pain., Disp: 50 tablet, Rfl: 0   polyethylene glycol (MIRALAX / GLYCOLAX) 17 g packet, Take 17 g by mouth daily as needed., Disp: 28 each, Rfl: 0   SM SENNA LAXATIVE 8.6 MG tablet, Take 8.6 mg by mouth daily., Disp: , Rfl:    sucralfate (CARAFATE) 1 GM/10ML suspension, Take 10 mLs (1 g total) by mouth 4 (four) times daily -  with meals and at bedtime., Disp: 420 mL, Rfl: 6   vitamin B-12 (CYANOCOBALAMIN) 1000 MCG tablet, Take 1 tablet (1,000 mcg total) by mouth daily., Disp: 30 tablet, Rfl: 1  Allergies:  Allergies  Allergen Reactions   Amoxicillin-Pot Clavulanate Hives and Itching   Penicillins Hives, Itching and Other (See Comments)   Codeine Nausea And Vomiting    Needs pre-meds     Past Medical History,  Surgical history, Social history, and Family History were reviewed and updated.  Review of Systems: Review of Systems  Constitutional:  Positive for appetite change.  HENT:  Negative.    Eyes: Negative.   Respiratory: Negative.    Cardiovascular: Negative.   Gastrointestinal:  Positive for abdominal pain and nausea.  Endocrine: Negative.   Genitourinary: Negative.    Musculoskeletal:  Positive for arthralgias and back pain.  Skin: Negative.   Neurological:  Positive for dizziness.  Hematological: Negative.   Psychiatric/Behavioral: Negative.     Physical Exam:  weight is 191 lb (86.6 kg). Her oral temperature is 98.5 F (36.9 C). Her blood pressure is 109/84 and her pulse is 102 (abnormal). Her respiration is 18 and  oxygen saturation is 98%.   Wt Readings from Last 3 Encounters:  05/13/21 191 lb (86.6 kg)  04/15/21 190 lb (86.2 kg)  03/11/21 186 lb 1.3 oz (84.4 kg)    Physical Exam Vitals reviewed.  HENT:     Head: Normocephalic and atraumatic.  Eyes:     Pupils: Pupils are equal, round, and reactive to light.  Cardiovascular:     Rate and Rhythm: Normal rate and regular rhythm.     Heart sounds: Normal heart sounds.  Pulmonary:     Effort: Pulmonary effort is normal.     Breath sounds: Normal breath sounds.  Abdominal:     General: Bowel sounds are normal.     Palpations: Abdomen is soft.  Musculoskeletal:        General: No tenderness or deformity. Normal range of motion.     Cervical back: Normal range of motion.  Lymphadenopathy:     Cervical: No cervical adenopathy.  Skin:    General: Skin is warm and dry.     Findings: No erythema or rash.  Neurological:     Mental Status: She is alert and oriented to person, place, and time.  Psychiatric:        Behavior: Behavior normal.        Thought Content: Thought content normal.        Judgment: Judgment normal.     Lab Results  Component Value Date   WBC 4.8 05/13/2021   HGB 13.1 05/13/2021   HCT 37.2 05/13/2021   MCV 105.1 (H) 05/13/2021   PLT 265 05/13/2021     Chemistry      Component Value Date/Time   NA 138 05/13/2021 1300   K 4.1 05/13/2021 1300   CL 104 05/13/2021 1300   CO2 26 05/13/2021 1300   BUN 16 05/13/2021 1300   CREATININE 0.98 05/13/2021 1300   CREATININE 0.88 10/22/2019 1246      Component Value Date/Time   CALCIUM 9.7 05/13/2021 1300   ALKPHOS 59 05/13/2021 1300   AST 19 05/13/2021 1300   ALT 20 05/13/2021 1300   BILITOT 0.4 05/13/2021 1300      Impression and Plan: Ms. Leventhal is a very charming 56 year old postmenopausal female with metastatic breast cancer.  We actually had seen her many years ago with a carcinoma in situ.  She is the wife of one of our former patients who passed away from  ocular melanoma.  I am so happy though the scans look great.  Again she is doing quite nicely.  I will see any need to make a change in her protocol.  Again we will check out Radiation Oncology to see if they would consider doing any kind of palliative treatment for her hips.  Again I  am just happy that her quality of life is doing so well right now.  We will go ahead and plan to get her back in another month.  I do not think we need any scans on her probably for another 3 months.   [name], MD 1/5/20232:09 PM

## 2021-05-13 NOTE — Patient Instructions (Signed)
Fulvestrant injection °What is this medication? °FULVESTRANT (ful VES trant) blocks the effects of estrogen. It is used to treat breast cancer. °This medicine may be used for other purposes; ask your health care provider or pharmacist if you have questions. °COMMON BRAND NAME(S): FASLODEX °What should I tell my care team before I take this medication? °They need to know if you have any of these conditions: °bleeding disorders °liver disease °low blood counts, like low white cell, platelet, or red cell counts °an unusual or allergic reaction to fulvestrant, other medicines, foods, dyes, or preservatives °pregnant or trying to get pregnant °breast-feeding °How should I use this medication? °This medicine is for injection into a muscle. It is usually given by a health care professional in a hospital or clinic setting. °Talk to your pediatrician regarding the use of this medicine in children. Special care may be needed. °Overdosage: If you think you have taken too much of this medicine contact a poison control center or emergency room at once. °NOTE: This medicine is only for you. Do not share this medicine with others. °What if I miss a dose? °It is important not to miss your dose. Call your doctor or health care professional if you are unable to keep an appointment. °What may interact with this medication? °medicines that treat or prevent blood clots like warfarin, enoxaparin, dalteparin, apixaban, dabigatran, and rivaroxaban °This list may not describe all possible interactions. Give your health care provider a list of all the medicines, herbs, non-prescription drugs, or dietary supplements you use. Also tell them if you smoke, drink alcohol, or use illegal drugs. Some items may interact with your medicine. °What should I watch for while using this medication? °Your condition will be monitored carefully while you are receiving this medicine. You will need important blood work done while you are taking this  medicine. °Do not become pregnant while taking this medicine or for at least 1 year after stopping it. Women of child-bearing potential will need to have a negative pregnancy test before starting this medicine. Women should inform their doctor if they wish to become pregnant or think they might be pregnant. There is a potential for serious side effects to an unborn child. Men should inform their doctors if they wish to father a child. This medicine may lower sperm counts. Talk to your health care professional or pharmacist for more information. Do not breast-feed an infant while taking this medicine or for 1 year after the last dose. °What side effects may I notice from receiving this medication? °Side effects that you should report to your doctor or health care professional as soon as possible: °allergic reactions like skin rash, itching or hives, swelling of the face, lips, or tongue °feeling faint or lightheaded, falls °pain, tingling, numbness, or weakness in the legs °signs and symptoms of infection like fever or chills; cough; flu-like symptoms; sore throat °vaginal bleeding °Side effects that usually do not require medical attention (report to your doctor or health care professional if they continue or are bothersome): °aches, pains °constipation °diarrhea °headache °hot flashes °nausea, vomiting °pain at site where injected °stomach pain °This list may not describe all possible side effects. Call your doctor for medical advice about side effects. You may report side effects to FDA at 1-800-FDA-1088. °Where should I keep my medication? °This drug is given in a hospital or clinic and will not be stored at home. °NOTE: This sheet is a summary. It may not cover all possible information. If you have   questions about this medicine, talk to your doctor, pharmacist, or health care provider. °© 2022 [employer] (2017-08-08 00:00:00) °Denosumab injection °What is this medication? °DENOSUMAB (den oh sue mab)  slows bone breakdown. Prolia is used to treat osteoporosis in women after menopause and in men, and in people who are taking corticosteroids for 6 months or more. Xgeva is used to treat a high calcium level due to cancer and to prevent bone fractures and other bone problems caused by multiple myeloma or cancer bone metastases. Xgeva is also used to treat giant cell tumor of the bone. °This medicine may be used for other purposes; ask your health care provider or pharmacist if you have questions. °COMMON BRAND NAME(S): Prolia, XGEVA °What should I tell my care team before I take this medication? °They need to know if you have any of these conditions: °dental disease °having surgery or tooth extraction °infection °kidney disease °low levels of calcium or Vitamin D in the blood °malnutrition °on hemodialysis °skin conditions or sensitivity °thyroid or parathyroid disease °an unusual reaction to denosumab, other medicines, foods, dyes, or preservatives °pregnant or trying to get pregnant °breast-feeding °How should I use this medication? °This medicine is for injection under the skin. It is given by a health care professional in a hospital or clinic setting. °A special MedGuide will be given to you before each treatment. Be sure to read this information carefully each time. °For Prolia, talk to your pediatrician regarding the use of this medicine in children. Special care may be needed. For Xgeva, talk to your pediatrician regarding the use of this medicine in children. While this drug may be prescribed for children as young as 13 years for selected conditions, precautions do apply. °Overdosage: If you think you have taken too much of this medicine contact a poison control center or emergency room at once. °NOTE: This medicine is only for you. Do not share this medicine with others. °What if I miss a dose? °It is important not to miss your dose. Call your doctor or health care professional if you are unable to keep an  appointment. °What may interact with this medication? °Do not take this medicine with any of the following medications: °other medicines containing denosumab °This medicine may also interact with the following medications: °medicines that lower your chance of fighting infection °steroid medicines like prednisone or cortisone °This list may not describe all possible interactions. Give your health care provider a list of all the medicines, herbs, non-prescription drugs, or dietary supplements you use. Also tell them if you smoke, drink alcohol, or use illegal drugs. Some items may interact with your medicine. °What should I watch for while using this medication? °Visit your doctor or health care professional for regular checks on your progress. Your doctor or health care professional may order blood tests and other tests to see how you are doing. °Call your doctor or health care professional for advice if you get a fever, chills or sore throat, or other symptoms of a cold or flu. Do not treat yourself. This drug may decrease your body's ability to fight infection. Try to avoid being around people who are sick. °You should make sure you get enough calcium and vitamin D while you are taking this medicine, unless your doctor tells you not to. Discuss the foods you eat and the vitamins you take with your health care professional. °See your dentist regularly. Brush and floss your teeth as directed. Before you have any dental work done, tell   your dentist you are receiving this medicine. °Do not become pregnant while taking this medicine or for 5 months after stopping it. Talk with your doctor or health care professional about your birth control options while taking this medicine. Women should inform their doctor if they wish to become pregnant or think they might be pregnant. There is a potential for serious side effects to an unborn child. Talk to your health care professional or pharmacist for more information. °What side  effects may I notice from receiving this medication? °Side effects that you should report to your doctor or health care professional as soon as possible: °allergic reactions like skin rash, itching or hives, swelling of the face, lips, or tongue °bone pain °breathing problems °dizziness °jaw pain, especially after dental work °redness, blistering, peeling of the skin °signs and symptoms of infection like fever or chills; cough; sore throat; pain or trouble passing urine °signs of low calcium like fast heartbeat, muscle cramps or muscle pain; pain, tingling, numbness in the hands or feet; seizures °unusual bleeding or bruising °unusually weak or tired °Side effects that usually do not require medical attention (report to your doctor or health care professional if they continue or are bothersome): °constipation °diarrhea °headache °joint pain °loss of appetite °muscle pain °runny nose °tiredness °upset stomach °This list may not describe all possible side effects. Call your doctor for medical advice about side effects. You may report side effects to FDA at 1-800-FDA-1088. °Where should I keep my medication? °This medicine is only given in a clinic, doctor's office, or other health care setting and will not be stored at home. °NOTE: This sheet is a summary. It may not cover all possible information. If you have questions about this medicine, talk to your doctor, pharmacist, or health care provider. °© 2022 [employer] (2017-09-01 00:00:00) ° °

## 2021-05-14 LAB — CANCER ANTIGEN 27.29: CA 27.29: 46.3 U/mL — ABNORMAL HIGH (ref 0.0–38.6)

## 2021-05-17 ENCOUNTER — Other Ambulatory Visit: Payer: Self-pay | Admitting: Internal Medicine

## 2021-05-17 NOTE — Telephone Encounter (Signed)
Ok to contact pt  Prozac refilled x 1 mo  Please to make ROV for further refills

## 2021-05-20 ENCOUNTER — Telehealth: Payer: Self-pay | Admitting: Radiation Oncology

## 2021-05-20 NOTE — Telephone Encounter (Signed)
Called patient to schedule consultation with Dr. Kinard. No answer, LVM for return call. ?

## 2021-05-26 NOTE — Progress Notes (Signed)
Histology and Location of Primary Cancer: remote history of DCIS 30 years ago  Sites of Visceral and Bony Metastatic Disease: history of thoracic spine mets in 2021, new bilateral hips  Location(s) of Symptomatic Metastases: bilateral hip pain  Past/Anticipated chemotherapy by medical oncology, if any: Dr Marin Olp Current Therapy:        Faslodex 500 mg IM monthly --start on 04/2020 Ibrance 100 mg p.o. daily (21d on/7d off) - start on 04/2020 Xgeva 120 mg subcu every 3 months -next dose 04//2023  Pain on a scale of 0-10 is: 7/10 bilateral hip and mid back pain   If Spine Met(s), symptoms, if any, include: Bowel/Bladder retention or incontinence (please describe): occasional mild urinary incontinence Numbness or weakness in extremities (please describe): no Current Decadron regimen, if applicable: none  Ambulatory status? Walker? Wheelchair?: Ambulatory  SAFETY ISSUES: Prior radiation? yes, back in 2021 Pacemaker/ICD? no Possible current pregnancy? no, postmenopausal Is the patient on methotrexate? no  Current Complaints / other details:  none    Vitals:   05/31/21 1339  BP: 110/87  Pulse: (!) 104  Resp: 20  Temp: 97.8 F (36.6 C)  SpO2: 100%  Weight: 191 lb 12.8 oz (87 kg)  Height: _0  (1.753 m)

## 2021-05-30 NOTE — Progress Notes (Signed)
Radiation Oncology         [phone] ________________________________  Initial Outpatient Consultation  Name: Caitlin Greene MRN: [id number]  Date: 05/31/2021  DOB: [birth year]  IZ:TIWP, Hunt Oris, MD  [name], MD   REFERRING PHYSICIAN: [name], MD  DIAGNOSIS: The primary encounter diagnosis was Metastatic breast cancer [facility]). A diagnosis of Metastatic cancer to bone [facility]) was also pertinent to this visit.  History of ductal carcinoma in situ diagnosed 30 years ago which was treated with right mastectomy and lymph node dissection at [facility].   Bony metastatic adenocarcinoma of multiple sites (from breast cancer primary) diagnosed in 2021; s/p chemotherapy and currently on antiestrogens  HISTORY OF PRESENT ILLNESS::Caitlin Greene is a 56 y.o. female who is accompanied by no one. she is seen as a courtesy of Dr. Marin Olp for an opinion concerning palliative radiation therapy as part of management for her hip pain.  The patient has a history of ductal carcinoma in situ diagnosed 30 years ago which was treated with right mastectomy and lymph node dissection at [facility]. She did not receive radiation or chemotherapy. The patient developed multiple sites of bony metastatic disease in 2021 (treatment and HPI detailed below). Currently, she has no evidence of disease progression, though has right and left hip pain which is bothersome for her.  The patient presented to the ED on 10/24/2019 due to complaints of thoracic pain that wraps around to her upper abdomen, and rectal bleeding. The patient was seen at an Urgent Care shortly before this on 10/22/2019 and was suspected to have constipation based on an abdominal x-ray. Despite taking miralax, her pain persisted. Upon evaluation in the ED, the patient reported that laying on her right side would worsen her pain.   CT of the abdomen and pelvis performed for evaluation on 10/24/2019 revealed diffuse mixed lytic and sclerotic lesions throughout  the included thoracolumbar spine and bony pelvis, including a pathologic burst fracture at the T10 level, with an observed 60% height loss and 8 mm of retropulsion resulting in severe canal stenosis. An additional pathologic biconvex pincer deformity was also noted at the L2 level with up to 70% height loss maximally. Findings were noted as highly suspicious for diffuse osseous metastatic disease, most likely from a recurrence of patient's prior breast cancer. Diffuse pancolonic diverticulosis without focal diverticular inflammation was also appreciated, and noted as a likely cause for the patient's rectal bleeding.   MRI of the lumbar spine on 10/24/2019 further revealed metastatic disease affecting L2 through L5. With the most advanced involvement seen at the L2 level where endplate collapse was appreciated on the left side, and some tumor extension through the posterior aspect of the vertebral body. No apparent neural compression was seen, however, a metastatic lesion was also noted within the right iliac bone. MRI of the thoracic spine also showed metastatic disease throughout the thoracic region. The most extensive involvement was appreciated at the T6 and T10 level, where the vertebral bodies appeared completely replaced by tumor, as well as early encroachment upon the ventral spinal canal but without cord compression. At T10, a loss of height of about 25% was seen, as well as a mild loss of height at T6 of about 10-20%. Bilateral early foraminal encroachment by tumor was also appreciated on the left at T5-6 and T6-7, and bilaterally at T9-10 and T10-11. In addition, extensive metastatic involvement of the T12 vertebral body was seen, and small metastatic deposits within T9, and likely early disease at T2.  MRI of the cervical spine showed no evidence of metastatic disease.  Subsequently, the patient was transferred to [facility] in [place] for admission and consideration of  spine surgery for management of the multiple areas of metastatic cancer. The patient was discharged to a skilled care nursing facility on 10/30/19 with instructions to follow up with breast cancer and medical oncology within 1 to 2 weeks for initiation of treatment and biopsy of the spine. While admitted to [facility], the patient was given jewett braces for her thoracic and lumbar spine and pain management.   Of note: the patient returned to the ED on 10/30/19 due to reports of inadequate care and conditions at the SNF.  Patient opted to undergo biopsies and kyphoplasty at T6, T10 and L2 on 11/12/2019 . Biopsies of T6 and L2 revealed metastatic adenocarcinoma; consistent with breast primary. The patient reported significant improvement in her pain following the kyphoplasty.   Subsequently, the patient consulted with Dr. Merrie Roof at [facility] on 11/20/19 to discuss treatment options. The patient agreed to proceed with systemic treatment consisting of anastrozole, palbociclib and denosumab. She also consented to starting anastrozole that following day.  MRI of the brain on 11/26/19 showed a focus of T1 hypointensity involving the rightward aspect of the clivus concerning for an osseous metastatic lesion. No acute intracranial findings or evidence for abnormal enhancement in the brain parenchyma were appreciated otherwise. CT of the head on 12/02/2019 again demonstrated the lesion in the right eccentric clivus as suspicious for an osseous metastatic lesion.   Whole body bone scan on 12/02/19 showed multifocal osseous metastatic disease involving the axial and appendicular skeleton, including focally increased uptake involving the base of the skull correlating with the clivus.   Whole body bone scan on 02/17/20 showed no new focal sites of osteoblastic metastatic disease.   CT of the chest abdomen and pelvis on 03/09/20 redemonstrated extensive osseous metastatic disease with  progression of sclerosis (relative to CT 10/25/2019) as well as status post interval cement augmentation of T6, T10, and L2 pathologic fractures. Increased sclerosis was noted as nonspecific and possibly secondary to treatment changes or disease progression.  The patient was then referred to Dr. Marin Olp on 04/07/20 for further evaluation. During this visit, Dr. Marin Olp reccommended a Faslodex/Ibrance combination for antiestrogen treatment which was was agreeable to. The patient was also started on Xgeva every 3 months. During a follow up visit with Dr. Marin Olp on 08/06/20, the patient was noted to tolerate her antiestrogens well without any significant side effects. The patient has continued on antiestrogens without issues.  Subsequent imaging from December 2021 to present day have showed no further progression of bony metastatic disease.   Her most recent CT of the chest abdomen and pelvis on 04/29/21 showed the stable appearance and distribution of her various osseous metastatic lesions. No findings were seen as suggestive of active extra osseous malignancy. CT did show a left adrenal nodule (which was previously noted) with enhancement characteristics strongly favoring adrenal adenoma.   Most recent bone scan on 04/29/21 showed appreciable bony lesions primarily in the thoracic and lumbar spine. The small sclerotic lesions in the ribs and right iliac bone were not readily appreciable. No new lesions were identified otherwise.  During her most recent follow up with Dr. Marin Olp on 05/13/21, the patient reported bilateral hip pain, though her most recent bone scan really didn't show anything around this area. Given her pain, Dr. Marin Olp referred the patient to  me for consideration of palliative radiation therapy in management of her hip pain.   PREVIOUS RADIATION THERAPY: No  PAST MEDICAL HISTORY:  Past Medical History:  Diagnosis Date   ALLERGIC RHINITIS    ANEMIA-IRON DEFICIENCY    ANXIETY     BREAST CANCER, HX OF 01/21/2007   at 56yo   DEPRESSION    GERD    HYPERLIPIDEMIA    Metastatic cancer to bone ([place]) dx'd 10/24/2019   recurrent breast ca   RENAL CALCULUS    rt breast ca dx'd 1988    PAST SURGICAL HISTORY: Past Surgical History:  Procedure Laterality Date   BIOPSY  03/21/2020   Procedure: BIOPSY;  Surgeon: Thornton Park, MD;  Location: [facility];  Service: Gastroenterology;;   BREAST ENHANCEMENT SURGERY     ESOPHAGOGASTRODUODENOSCOPY (EGD) WITH PROPOFOL N/A 03/21/2020   Procedure: ESOPHAGOGASTRODUODENOSCOPY (EGD) WITH PROPOFOL;  Surgeon: Thornton Park, MD;  Location: [facility];  Service: Gastroenterology;  Laterality: N/A;   MASTECTOMY     right   TEMPOROMANDIBULAR JOINT SURGERY     Left   TONSILLECTOMY      FAMILY HISTORY:  Family History  Problem Relation Age of Onset   Colon polyps Mother    Cancer Mother        Uterine Cancer   Hypertension Mother    Dementia Mother    Colon polyps Father    COPD Father        smoked   Hypertension Other    Diabetes Other    Asthma Sister     SOCIAL HISTORY:  Social History   Tobacco Use   Smoking status: Never   Smokeless tobacco: Never  Vaping Use   Vaping Use: Never used  Substance Use Topics   Alcohol use: No    Alcohol/week: 0.0 standard drinks   Drug use: No    ALLERGIES:  Allergies  Allergen Reactions   Amoxicillin-Pot Clavulanate Hives and Itching   Penicillins Hives, Itching and Other (See Comments)   Codeine Nausea And Vomiting    Needs pre-meds     MEDICATIONS:  Current Outpatient Medications  Medication Sig Dispense Refill   acetaminophen (TYLENOL) 650 MG CR tablet Take 650 mg by mouth every 8 (eight) hours as needed for pain.     CALCIUM PO Take 1 tablet by mouth daily.     COVID-19 mRNA bivalent vaccine, Pfizer, (PFIZER COVID-19 VAC BIVALENT) injection Inject into the muscle. 0.3 mL 0   famciclovir (FAMVIR) 250 MG tablet Take 1 tablet (250 mg total) by mouth  daily. 30 tablet 6   FLUoxetine (PROZAC) 20 MG capsule TAKE 3 CAPSULES(60 MG) BY MOUTH DAILY 60 capsule 0   hydrOXYzine (ATARAX/VISTARIL) 25 MG tablet Take 25 mg by mouth 3 (three) times daily as needed for anxiety.     IBRANCE 100 MG tablet Take 100 mg by mouth See admin instructions. 100mg  daily on day 1-21 of cycle. 7 days off.  Repeat.     omeprazole (PRILOSEC) 20 MG capsule Take 20 mg by mouth daily.     ondansetron (ZOFRAN) 4 MG tablet Take 1 tablet (4 mg total) by mouth every 4 (four) hours as needed. 20 tablet 0   oxyCODONE (OXY IR/ROXICODONE) 5 MG immediate release tablet Take 1 tablet (5 mg total) by mouth every 8 (eight) hours as needed for severe pain. 50 tablet 0   polyethylene glycol (MIRALAX / GLYCOLAX) 17 g packet Take 17 g by mouth daily as needed. 28 each 0   SM  SENNA LAXATIVE 8.6 MG tablet Take 8.6 mg by mouth daily.     sucralfate (CARAFATE) 1 GM/10ML suspension Take 10 mLs (1 g total) by mouth 4 (four) times daily -  with meals and at bedtime. 420 mL 6   vitamin B-12 (CYANOCOBALAMIN) 1000 MCG tablet Take 1 tablet (1,000 mcg total) by mouth daily. 30 tablet 1   No current facility-administered medications for this encounter.    REVIEW OF SYSTEMS:  A 10+ POINT REVIEW OF SYSTEMS WAS OBTAINED including neurology, dermatology, psychiatry, cardiac, respiratory, lymph, extremities, GI, GU, musculoskeletal, constitutional, reproductive, HEENT.  She reports bilateral hip pain.  She reports having this several months ago and underwent physical therapy and dry needling which helped.  She denies any numbness or weakness in her lower extremities.   PHYSICAL EXAM:  height is 5\' 9"  (1.753 m) and weight is 191 lb 12.8 oz (87 kg). Her temperature is 97.8 F (36.6 C). Her blood pressure is 110/87 and her pulse is 104 (abnormal). Her respiration is 20 and oxygen saturation is 100%.   General: Alert and oriented, in no acute distress HEENT: Head is normocephalic. Extraocular movements are  intact.  Neck: Neck is supple, no palpable cervical or supraclavicular lymphadenopathy. Heart: Regular in rate and rhythm with no murmurs, rubs, or gallops. Chest: Clear to auscultation bilaterally, with no rhonchi, wheezes, or rales. Abdomen: Soft, nontender, nondistended, with no rigidity or guarding. Extremities: No cyanosis or edema. Lymphatics: see Neck Exam Skin: No concerning lesions. Musculoskeletal: symmetric strength and muscle tone throughout. Neurologic: Cranial nerves II through XII are grossly intact. No obvious focalities. Speech is fluent. Coordination is intact. Psychiatric: Judgment and insight are intact. Affect is appropriate.   ECOG = 1  0 - Asymptomatic (Fully active, able to carry on all predisease activities without restriction)  1 - Symptomatic but completely ambulatory (Restricted in physically strenuous activity but ambulatory and able to carry out work of a light or sedentary nature. For example, light housework, office work)  2 - Symptomatic, <50% in bed during the day (Ambulatory and capable of all self care but unable to carry out any work activities. Up and about more than 50% of waking hours)  3 - Symptomatic, >50% in bed, but not bedbound (Capable of only limited self-care, confined to bed or chair 50% or more of waking hours)  4 - Bedbound (Completely disabled. Cannot carry on any self-care. Totally confined to bed or chair)  5 - Death   Eustace Pen MM, Creech RH, Tormey DC, et al. [phone]). "Toxicity and response criteria of the [facility] Group". [place] Oncol. 5 (6): 649-55  LABORATORY DATA:  Lab Results  Component Value Date   WBC 4.8 05/13/2021   HGB 13.1 05/13/2021   HCT 37.2 05/13/2021   MCV 105.1 (H) 05/13/2021   PLT 265 05/13/2021   NEUTROABS 2.5 05/13/2021   Lab Results  Component Value Date   NA 138 05/13/2021   K 4.1 05/13/2021   CL 104 05/13/2021   CO2 26 05/13/2021   GLUCOSE 83 05/13/2021   CREATININE 0.98  05/13/2021   CALCIUM 9.7 05/13/2021      RADIOGRAPHY: No results found.    IMPRESSION: History of ductal carcinoma in situ diagnosed 30 years ago which was treated with right mastectomy and lymph node dissection at [facility].   Bony metastatic adenocarcinoma of multiple sites (from breast cancer primary) diagnosed in 2021; s/p chemotherapy and currently on antiestrogens  She has been bilateral hip pain which  is not explained by any of her recent imaging studies.  We reviewed these images of her bone scan and CT scan.  I suggest that we consider an MRI of the pelvis which sometimes can be more sensitive in evaluating metastatic disease.  She is interested in pursuing the study and I have ordered MRI of the pelvis with and without contrast to be completed in the near future.    PLAN: MRI of the pelvis to evaluate for metastatic disease.  After the study is complete she will return to see me for review and possibly planning of palliative radiation therapy.   60 minutes of total time was spent for this patient encounter, including preparation, face-to-face counseling with the patient and coordination of care, physical exam, and documentation of the encounter.   ------------------------------------------------  Blair Promise, PhD, MD  This document serves as a record of services personally performed by Gery Pray, MD. It was created on his behalf by Roney Mans, a trained medical scribe. The creation of this record is based on the scribe's personal observations and the provider's statements to them. This document has been checked and approved by the attending provider.

## 2021-05-31 ENCOUNTER — Ambulatory Visit
Admission: RE | Admit: 2021-05-31 | Discharge: 2021-05-31 | Disposition: A | Discharge status: Home / Self Care | Payer: BC Managed Care – PPO | Source: Ambulatory Visit | Attending: Radiation Oncology | Admitting: Radiation Oncology

## 2021-05-31 ENCOUNTER — Encounter: Payer: Self-pay | Admitting: Radiation Oncology

## 2021-05-31 ENCOUNTER — Other Ambulatory Visit: Payer: Self-pay

## 2021-05-31 VITALS — BP 110/87 | HR 104 | Temp 97.8°F | Resp 20 | Ht 69.0 in | Wt 191.8 lb

## 2021-05-31 DIAGNOSIS — Z79899 Other long term (current) drug therapy: Secondary | ICD-10-CM | POA: Insufficient documentation

## 2021-05-31 DIAGNOSIS — C50911 Malignant neoplasm of unspecified site of right female breast: Secondary | ICD-10-CM | POA: Insufficient documentation

## 2021-05-31 DIAGNOSIS — K5730 Diverticulosis of large intestine without perforation or abscess without bleeding: Secondary | ICD-10-CM | POA: Insufficient documentation

## 2021-05-31 DIAGNOSIS — C7951 Secondary malignant neoplasm of bone: Secondary | ICD-10-CM | POA: Diagnosis not present

## 2021-05-31 DIAGNOSIS — K219 Gastro-esophageal reflux disease without esophagitis: Secondary | ICD-10-CM | POA: Diagnosis not present

## 2021-05-31 DIAGNOSIS — Z170 Estrogen receptor positive status [ER+]: Secondary | ICD-10-CM | POA: Diagnosis not present

## 2021-05-31 DIAGNOSIS — Z9011 Acquired absence of right breast and nipple: Secondary | ICD-10-CM | POA: Insufficient documentation

## 2021-05-31 DIAGNOSIS — C50919 Malignant neoplasm of unspecified site of unspecified female breast: Secondary | ICD-10-CM

## 2021-05-31 DIAGNOSIS — E785 Hyperlipidemia, unspecified: Secondary | ICD-10-CM | POA: Insufficient documentation

## 2021-05-31 DIAGNOSIS — Z87442 Personal history of urinary calculi: Secondary | ICD-10-CM | POA: Insufficient documentation

## 2021-05-31 DIAGNOSIS — K625 Hemorrhage of anus and rectum: Secondary | ICD-10-CM | POA: Diagnosis not present

## 2021-05-31 NOTE — Progress Notes (Signed)
See MD note for nursing evaluation. °

## 2021-06-05 ENCOUNTER — Other Ambulatory Visit: Payer: Self-pay | Admitting: Internal Medicine

## 2021-06-05 NOTE — Telephone Encounter (Signed)
Ok to contact pt - last rx prozac done today - for ROV for further refills

## 2021-06-10 ENCOUNTER — Other Ambulatory Visit: Payer: Self-pay

## 2021-06-10 ENCOUNTER — Ambulatory Visit (HOSPITAL_COMMUNITY)
Admission: RE | Admit: 2021-06-10 | Discharge: 2021-06-10 | Disposition: A | Discharge status: Home / Self Care | Payer: BC Managed Care – PPO | Source: Ambulatory Visit | Attending: Radiation Oncology | Admitting: Radiation Oncology

## 2021-06-10 DIAGNOSIS — K5730 Diverticulosis of large intestine without perforation or abscess without bleeding: Secondary | ICD-10-CM | POA: Diagnosis not present

## 2021-06-10 DIAGNOSIS — M160 Bilateral primary osteoarthritis of hip: Secondary | ICD-10-CM | POA: Diagnosis not present

## 2021-06-10 DIAGNOSIS — C7951 Secondary malignant neoplasm of bone: Secondary | ICD-10-CM | POA: Diagnosis not present

## 2021-06-10 DIAGNOSIS — I96 Gangrene, not elsewhere classified: Secondary | ICD-10-CM | POA: Diagnosis not present

## 2021-06-10 DIAGNOSIS — N888 Other specified noninflammatory disorders of cervix uteri: Secondary | ICD-10-CM | POA: Diagnosis not present

## 2021-06-10 MED ORDER — GADOBUTROL 1 MMOL/ML IV SOLN
8.0000 mL | Freq: Once | INTRAVENOUS | Status: AC | PRN
  Administered 2021-06-10: 8 mL via INTRAVENOUS

## 2021-06-11 NOTE — Progress Notes (Signed)
Histology and Location of Primary Cancer: remote history of DCIS 30 years ago   Sites of Visceral and Bony Metastatic Disease: history of thoracic spine mets in 2021, new bilateral hips   Location(s) of Symptomatic Metastases: bilateral hip pain   Past/Anticipated chemotherapy by medical oncology, if any: Dr Marin Olp Current Therapy:        Faslodex 500 mg IM monthly --start on 04/2020 Ibrance 100 mg p.o. daily (21d on/7d off) - start on 04/2020 Xgeva 120 mg subcu every 3 months -next dose 04//2023   Pain on a scale of 0-10 is: 5/10 bilateral hip and mid back pain      If Spine Met(s), symptoms, if any, include: Bowel/Bladder retention or incontinence (please describe): urinary frequency, urgency, nocturia x1 Numbness or weakness in extremities (please describe):  denies Current Decadron regimen, if applicable: none   Ambulatory status? Walker? Wheelchair?: Ambulatory    SAFETY ISSUES: Prior radiation? yes, T spine in 2021  Pacemaker/ICD? no  Possible current pregnancy? no, postmenopausal Is the patient on methotrexate? no    Current Complaints / other details:  none   Vitals:   06/17/21 1429  BP: 106/79  Pulse: 86  Resp: 20  Temp: (!) 97.2 F (36.2 C)  SpO2: 100%  Weight: 197 lb 6.4 oz (89.5 kg)  Height: _0  (1.753 m)

## 2021-06-14 ENCOUNTER — Inpatient Hospital Stay: Payer: BC Managed Care – PPO | Admitting: Hematology & Oncology

## 2021-06-14 ENCOUNTER — Inpatient Hospital Stay: Payer: BC Managed Care – PPO

## 2021-06-14 ENCOUNTER — Inpatient Hospital Stay: Payer: BC Managed Care – PPO | Attending: Hematology & Oncology

## 2021-06-14 ENCOUNTER — Other Ambulatory Visit: Payer: Self-pay

## 2021-06-14 ENCOUNTER — Encounter: Payer: Self-pay | Admitting: Hematology & Oncology

## 2021-06-14 VITALS — BP 105/73 | HR 89 | Temp 98.2°F | Resp 18 | Wt 194.0 lb

## 2021-06-14 DIAGNOSIS — C50919 Malignant neoplasm of unspecified site of unspecified female breast: Secondary | ICD-10-CM | POA: Diagnosis not present

## 2021-06-14 DIAGNOSIS — Z79818 Long term (current) use of other agents affecting estrogen receptors and estrogen levels: Secondary | ICD-10-CM | POA: Insufficient documentation

## 2021-06-14 DIAGNOSIS — Z923 Personal history of irradiation: Secondary | ICD-10-CM | POA: Diagnosis not present

## 2021-06-14 DIAGNOSIS — C7951 Secondary malignant neoplasm of bone: Secondary | ICD-10-CM | POA: Insufficient documentation

## 2021-06-14 DIAGNOSIS — Z79899 Other long term (current) drug therapy: Secondary | ICD-10-CM | POA: Insufficient documentation

## 2021-06-14 LAB — CBC WITH DIFFERENTIAL (CANCER CENTER ONLY)
Abs Immature Granulocytes: 0.01 10*3/uL (ref 0.00–0.07)
Basophils Absolute: 0.1 10*3/uL (ref 0.0–0.1)
Basophils Relative: 2 %
Eosinophils Absolute: 0 10*3/uL (ref 0.0–0.5)
Eosinophils Relative: 1 %
HCT: 34.4 % — ABNORMAL LOW (ref 36.0–46.0)
Hemoglobin: 11.9 g/dL — ABNORMAL LOW (ref 12.0–15.0)
Immature Granulocytes: 0 %
Lymphocytes Relative: 40 %
Lymphs Abs: 1 10*3/uL (ref 0.7–4.0)
MCH: 37.1 pg — ABNORMAL HIGH (ref 26.0–34.0)
MCHC: 34.6 g/dL (ref 30.0–36.0)
MCV: 107.2 fL — ABNORMAL HIGH (ref 80.0–100.0)
Monocytes Absolute: 0.4 10*3/uL (ref 0.1–1.0)
Monocytes Relative: 14 %
Neutro Abs: 1.1 10*3/uL — ABNORMAL LOW (ref 1.7–7.7)
Neutrophils Relative %: 43 %
Platelet Count: 214 10*3/uL (ref 150–400)
RBC: 3.21 MIL/uL — ABNORMAL LOW (ref 3.87–5.11)
RDW: 14.6 % (ref 11.5–15.5)
WBC Count: 2.6 10*3/uL — ABNORMAL LOW (ref 4.0–10.5)
nRBC: 0 % (ref 0.0–0.2)

## 2021-06-14 LAB — CMP (CANCER CENTER ONLY)
ALT: 10 U/L (ref 0–44)
AST: 11 U/L — ABNORMAL LOW (ref 15–41)
Albumin: 4 g/dL (ref 3.5–5.0)
Alkaline Phosphatase: 46 U/L (ref 38–126)
Anion gap: 7 (ref 5–15)
BUN: 11 mg/dL (ref 6–20)
CO2: 28 mmol/L (ref 22–32)
Calcium: 8.9 mg/dL (ref 8.9–10.3)
Chloride: 104 mmol/L (ref 98–111)
Creatinine: 0.88 mg/dL (ref 0.44–1.00)
GFR, Estimated: >60 mL/min (ref >60)
Glucose, Bld: 142 mg/dL — ABNORMAL HIGH (ref 70–99)
Potassium: 3.7 mmol/L (ref 3.5–5.1)
Sodium: 139 mmol/L (ref 135–145)
Total Bilirubin: 0.5 mg/dL (ref 0.3–1.2)
Total Protein: 6.5 g/dL (ref 6.5–8.1)

## 2021-06-14 LAB — LACTATE DEHYDROGENASE: LDH: 173 U/L (ref 98–192)

## 2021-06-14 MED ORDER — FULVESTRANT 250 MG/5ML IM SOSY
500.0000 mg | PREFILLED_SYRINGE | INTRAMUSCULAR | Status: DC
  Administered 2021-06-14: 500 mg via INTRAMUSCULAR
  Filled 2021-06-14: qty 10

## 2021-06-14 MED ORDER — AZITHROMYCIN 250 MG PO TABS: ORAL_TABLET | ORAL | 0 refills | Status: DC

## 2021-06-14 NOTE — Patient Instructions (Signed)
Fulvestrant injection °What is this medication? °FULVESTRANT (ful VES trant) blocks the effects of estrogen. It is used to treat breast cancer. °This medicine may be used for other purposes; ask your health care provider or pharmacist if you have questions. °COMMON BRAND NAME(S): FASLODEX °What should I tell my care team before I take this medication? °They need to know if you have any of these conditions: °bleeding disorders °liver disease °low blood counts, like low white cell, platelet, or red cell counts °an unusual or allergic reaction to fulvestrant, other medicines, foods, dyes, or preservatives °pregnant or trying to get pregnant °breast-feeding °How should I use this medication? °This medicine is for injection into a muscle. It is usually given by a health care professional in a hospital or clinic setting. °Talk to your pediatrician regarding the use of this medicine in children. Special care may be needed. °Overdosage: If you think you have taken too much of this medicine contact a poison control center or emergency room at once. °NOTE: This medicine is only for you. Do not share this medicine with others. °What if I miss a dose? °It is important not to miss your dose. Call your doctor or health care professional if you are unable to keep an appointment. °What may interact with this medication? °medicines that treat or prevent blood clots like warfarin, enoxaparin, dalteparin, apixaban, dabigatran, and rivaroxaban °This list may not describe all possible interactions. Give your health care provider a list of all the medicines, herbs, non-prescription drugs, or dietary supplements you use. Also tell them if you smoke, drink alcohol, or use illegal drugs. Some items may interact with your medicine. °What should I watch for while using this medication? °Your condition will be monitored carefully while you are receiving this medicine. You will need important blood work done while you are taking this  medicine. °Do not become pregnant while taking this medicine or for at least 1 year after stopping it. Women of child-bearing potential will need to have a negative pregnancy test before starting this medicine. Women should inform their doctor if they wish to become pregnant or think they might be pregnant. There is a potential for serious side effects to an unborn child. Men should inform their doctors if they wish to father a child. This medicine may lower sperm counts. Talk to your health care professional or pharmacist for more information. Do not breast-feed an infant while taking this medicine or for 1 year after the last dose. °What side effects may I notice from receiving this medication? °Side effects that you should report to your doctor or health care professional as soon as possible: °allergic reactions like skin rash, itching or hives, swelling of the face, lips, or tongue °feeling faint or lightheaded, falls °pain, tingling, numbness, or weakness in the legs °signs and symptoms of infection like fever or chills; cough; flu-like symptoms; sore throat °vaginal bleeding °Side effects that usually do not require medical attention (report to your doctor or health care professional if they continue or are bothersome): °aches, pains °constipation °diarrhea °headache °hot flashes °nausea, vomiting °pain at site where injected °stomach pain °This list may not describe all possible side effects. Call your doctor for medical advice about side effects. You may report side effects to FDA at 1-800-FDA-1088. °Where should I keep my medication? °This drug is given in a hospital or clinic and will not be stored at home. °NOTE: This sheet is a summary. It may not cover all possible information. If you have   questions about this medicine, talk to your doctor, pharmacist, or health care provider. °© 2022 [employer] (2017-08-08 00:00:00) °Denosumab injection °What is this medication? °DENOSUMAB (den oh sue mab)  slows bone breakdown. Prolia is used to treat osteoporosis in women after menopause and in men, and in people who are taking corticosteroids for 6 months or more. Xgeva is used to treat a high calcium level due to cancer and to prevent bone fractures and other bone problems caused by multiple myeloma or cancer bone metastases. Xgeva is also used to treat giant cell tumor of the bone. °This medicine may be used for other purposes; ask your health care provider or pharmacist if you have questions. °COMMON BRAND NAME(S): Prolia, XGEVA °What should I tell my care team before I take this medication? °They need to know if you have any of these conditions: °dental disease °having surgery or tooth extraction °infection °kidney disease °low levels of calcium or Vitamin D in the blood °malnutrition °on hemodialysis °skin conditions or sensitivity °thyroid or parathyroid disease °an unusual reaction to denosumab, other medicines, foods, dyes, or preservatives °pregnant or trying to get pregnant °breast-feeding °How should I use this medication? °This medicine is for injection under the skin. It is given by a health care professional in a hospital or clinic setting. °A special MedGuide will be given to you before each treatment. Be sure to read this information carefully each time. °For Prolia, talk to your pediatrician regarding the use of this medicine in children. Special care may be needed. For Xgeva, talk to your pediatrician regarding the use of this medicine in children. While this drug may be prescribed for children as young as 13 years for selected conditions, precautions do apply. °Overdosage: If you think you have taken too much of this medicine contact a poison control center or emergency room at once. °NOTE: This medicine is only for you. Do not share this medicine with others. °What if I miss a dose? °It is important not to miss your dose. Call your doctor or health care professional if you are unable to keep an  appointment. °What may interact with this medication? °Do not take this medicine with any of the following medications: °other medicines containing denosumab °This medicine may also interact with the following medications: °medicines that lower your chance of fighting infection °steroid medicines like prednisone or cortisone °This list may not describe all possible interactions. Give your health care provider a list of all the medicines, herbs, non-prescription drugs, or dietary supplements you use. Also tell them if you smoke, drink alcohol, or use illegal drugs. Some items may interact with your medicine. °What should I watch for while using this medication? °Visit your doctor or health care professional for regular checks on your progress. Your doctor or health care professional may order blood tests and other tests to see how you are doing. °Call your doctor or health care professional for advice if you get a fever, chills or sore throat, or other symptoms of a cold or flu. Do not treat yourself. This drug may decrease your body's ability to fight infection. Try to avoid being around people who are sick. °You should make sure you get enough calcium and vitamin D while you are taking this medicine, unless your doctor tells you not to. Discuss the foods you eat and the vitamins you take with your health care professional. °See your dentist regularly. Brush and floss your teeth as directed. Before you have any dental work done, tell   your dentist you are receiving this medicine. °Do not become pregnant while taking this medicine or for 5 months after stopping it. Talk with your doctor or health care professional about your birth control options while taking this medicine. Women should inform their doctor if they wish to become pregnant or think they might be pregnant. There is a potential for serious side effects to an unborn child. Talk to your health care professional or pharmacist for more information. °What side  effects may I notice from receiving this medication? °Side effects that you should report to your doctor or health care professional as soon as possible: °allergic reactions like skin rash, itching or hives, swelling of the face, lips, or tongue °bone pain °breathing problems °dizziness °jaw pain, especially after dental work °redness, blistering, peeling of the skin °signs and symptoms of infection like fever or chills; cough; sore throat; pain or trouble passing urine °signs of low calcium like fast heartbeat, muscle cramps or muscle pain; pain, tingling, numbness in the hands or feet; seizures °unusual bleeding or bruising °unusually weak or tired °Side effects that usually do not require medical attention (report to your doctor or health care professional if they continue or are bothersome): °constipation °diarrhea °headache °joint pain °loss of appetite °muscle pain °runny nose °tiredness °upset stomach °This list may not describe all possible side effects. Call your doctor for medical advice about side effects. You may report side effects to FDA at 1-800-FDA-1088. °Where should I keep my medication? °This medicine is only given in a clinic, doctor's office, or other health care setting and will not be stored at home. °NOTE: This sheet is a summary. It may not cover all possible information. If you have questions about this medicine, talk to your doctor, pharmacist, or health care provider. °© 2022 [employer] (2017-09-01 00:00:00) ° °

## 2021-06-14 NOTE — Progress Notes (Signed)
Hematology and Oncology Follow Up Visit  Caitlin Greene [id number] 02/22/1966 56 y.o. 06/14/2021   Principle Diagnosis:  Metastatic breast cancer-ER positive/HER-2 negative --bone metastasis only  Current Therapy:   Faslodex 500 mg IM monthly --start on 04/2020 Ibrance 100 mg p.o. daily (21d on/7d off) - start on 04/2020 Xgeva 120 mg subcu every 3 months -next dose 04//2023     Interim History:  Caitlin Greene is in for follow-up.  She is doing pretty well.  She has had really no complaints since we last saw her although she is having some hip pain.  We did send her to Radiation Oncology.  She did have a MRI done, that was ordered by Dr. Sondra Come.  The MRI certainly is quite lengthy.  Again is hard to tell what exactly is active and not.  I am sure that Dr. Sondra Come could tell and do radiation therapy if necessary.  Her last CA 27.29 was holding steady at 46.  She has had no problems with cough or shortness of breath.  She has had no nausea or vomiting.  She has had no rashes.  There is been no leg swelling.  She has had no headache.  Overall, her performance status is ECOG 1.     Medications:  Current Outpatient Medications:    acetaminophen (TYLENOL) 650 MG CR tablet, Take 650 mg by mouth every 8 (eight) hours as needed for pain., Disp: , Rfl:    CALCIUM PO, Take 1 tablet by mouth daily., Disp: , Rfl:    COVID-19 mRNA bivalent vaccine, Pfizer, (PFIZER COVID-19 VAC BIVALENT) injection, Inject into the muscle., Disp: 0.3 mL, Rfl: 0   famciclovir (FAMVIR) 250 MG tablet, Take 1 tablet (250 mg total) by mouth daily., Disp: 30 tablet, Rfl: 6   FLUoxetine (PROZAC) 20 MG capsule, TAKE 3 CAPSULES(60 MG) BY MOUTH DAILY, Disp: 60 capsule, Rfl: 0   hydrOXYzine (ATARAX/VISTARIL) 25 MG tablet, Take 25 mg by mouth 3 (three) times daily as needed for anxiety., Disp: , Rfl:    IBRANCE 100 MG tablet, Take 100 mg by mouth See admin instructions. 149m daily on day 1-21 of cycle. 7 days off.  Repeat., Disp: ,  Rfl:    omeprazole (PRILOSEC) 20 MG capsule, Take 20 mg by mouth daily., Disp: , Rfl:    ondansetron (ZOFRAN) 4 MG tablet, Take 1 tablet (4 mg total) by mouth every 4 (four) hours as needed., Disp: 20 tablet, Rfl: 0   oxyCODONE (OXY IR/ROXICODONE) 5 MG immediate release tablet, Take 1 tablet (5 mg total) by mouth every 8 (eight) hours as needed for severe pain., Disp: 50 tablet, Rfl: 0   polyethylene glycol (MIRALAX / GLYCOLAX) 17 g packet, Take 17 g by mouth daily as needed., Disp: 28 each, Rfl: 0   SM SENNA LAXATIVE 8.6 MG tablet, Take 8.6 mg by mouth daily., Disp: , Rfl:    sucralfate (CARAFATE) 1 GM/10ML suspension, Take 10 mLs (1 g total) by mouth 4 (four) times daily -  with meals and at bedtime., Disp: 420 mL, Rfl: 6   vitamin B-12 (CYANOCOBALAMIN) 1000 MCG tablet, Take 1 tablet (1,000 mcg total) by mouth daily., Disp: 30 tablet, Rfl: 1 No current facility-administered medications for this visit.  Facility-Administered Medications Ordered in Other Visits:    fulvestrant (FASLODEX) injection 500 mg, 500 mg, Intramuscular, Q30 days, Breyona Swander, PRudell Cobb MD  Allergies:  Allergies  Allergen Reactions   Amoxicillin-Pot Clavulanate Hives and Itching   Penicillins Hives, Itching and Other (See Comments)  Codeine Nausea And Vomiting    Needs pre-meds     Past Medical History, Surgical history, Social history, and Family History were reviewed and updated.  Review of Systems: Review of Systems  Constitutional:  Positive for appetite change.  HENT:  Negative.    Eyes: Negative.   Respiratory: Negative.    Cardiovascular: Negative.   Gastrointestinal:  Positive for abdominal pain and nausea.  Endocrine: Negative.   Genitourinary: Negative.    Musculoskeletal:  Positive for arthralgias and back pain.  Skin: Negative.   Neurological:  Positive for dizziness.  Hematological: Negative.   Psychiatric/Behavioral: Negative.     Physical Exam:  weight is 194 lb (88 kg). Her oral  temperature is 98.2 F (36.8 C). Her blood pressure is 105/73 and her pulse is 89. Her respiration is 18 and oxygen saturation is 100%.   Wt Readings from Last 3 Encounters:  06/14/21 194 lb (88 kg)  05/31/21 191 lb 12.8 oz (87 kg)  05/13/21 191 lb (86.6 kg)    Physical Exam Vitals reviewed.  HENT:     Head: Normocephalic and atraumatic.  Eyes:     Pupils: Pupils are equal, round, and reactive to light.  Cardiovascular:     Rate and Rhythm: Normal rate and regular rhythm.     Heart sounds: Normal heart sounds.  Pulmonary:     Effort: Pulmonary effort is normal.     Breath sounds: Normal breath sounds.  Abdominal:     General: Bowel sounds are normal.     Palpations: Abdomen is soft.  Musculoskeletal:        General: No tenderness or deformity. Normal range of motion.     Cervical back: Normal range of motion.  Lymphadenopathy:     Cervical: No cervical adenopathy.  Skin:    General: Skin is warm and dry.     Findings: No erythema or rash.  Neurological:     Mental Status: She is alert and oriented to person, place, and time.  Psychiatric:        Behavior: Behavior normal.        Thought Content: Thought content normal.        Judgment: Judgment normal.     Lab Results  Component Value Date   WBC 2.6 (L) 06/14/2021   HGB 11.9 (L) 06/14/2021   HCT 34.4 (L) 06/14/2021   MCV 107.2 (H) 06/14/2021   PLT 214 06/14/2021     Chemistry      Component Value Date/Time   NA 139 06/14/2021 1205   K 3.7 06/14/2021 1205   CL 104 06/14/2021 1205   CO2 28 06/14/2021 1205   BUN 11 06/14/2021 1205   CREATININE 0.88 06/14/2021 1205   CREATININE 0.88 10/22/2019 1246      Component Value Date/Time   CALCIUM 8.9 06/14/2021 1205   ALKPHOS 46 06/14/2021 1205   AST 11 (L) 06/14/2021 1205   ALT 10 06/14/2021 1205   BILITOT 0.5 06/14/2021 1205      Impression and Plan: Caitlin Greene is a very charming 56 year old postmenopausal female with metastatic breast cancer.  We actually  had seen her many years ago with a carcinoma in situ.  She is the wife of one of our former patients who passed away from ocular melanoma.  For right now, we will continue her on the Ibrance and Faslodex.  She is not due for Xgeva until April.  We will see if Dr. Sondra Come will do radiation.  Again, he may think  that radiation is necessary.  We will plan to see Caitlin Greene back, as always, in 1 month.    [name], MD 2/6/20231:24 PM

## 2021-06-14 NOTE — Addendum Note (Signed)
Addended by: [name] on: 06/14/2021 01:33 PM   Modules accepted: Orders

## 2021-06-15 LAB — CANCER ANTIGEN 27.29: CA 27.29: 47.9 U/mL — ABNORMAL HIGH (ref 0.0–38.6)

## 2021-06-17 ENCOUNTER — Ambulatory Visit
Admission: RE | Admit: 2021-06-17 | Discharge: 2021-06-17 | Disposition: A | Discharge status: Home / Self Care | Payer: BC Managed Care – PPO | Source: Ambulatory Visit | Attending: Radiation Oncology | Admitting: Radiation Oncology

## 2021-06-17 ENCOUNTER — Other Ambulatory Visit: Payer: Self-pay

## 2021-06-17 ENCOUNTER — Encounter: Payer: Self-pay | Admitting: Radiation Oncology

## 2021-06-17 VITALS — BP 106/79 | HR 86 | Temp 97.2°F | Resp 20 | Ht 69.0 in | Wt 197.4 lb

## 2021-06-17 DIAGNOSIS — K625 Hemorrhage of anus and rectum: Secondary | ICD-10-CM | POA: Diagnosis not present

## 2021-06-17 DIAGNOSIS — C7951 Secondary malignant neoplasm of bone: Secondary | ICD-10-CM | POA: Insufficient documentation

## 2021-06-17 DIAGNOSIS — Z9011 Acquired absence of right breast and nipple: Secondary | ICD-10-CM | POA: Insufficient documentation

## 2021-06-17 DIAGNOSIS — C50911 Malignant neoplasm of unspecified site of right female breast: Secondary | ICD-10-CM | POA: Insufficient documentation

## 2021-06-17 DIAGNOSIS — M546 Pain in thoracic spine: Secondary | ICD-10-CM | POA: Insufficient documentation

## 2021-06-17 DIAGNOSIS — C50919 Malignant neoplasm of unspecified site of unspecified female breast: Secondary | ICD-10-CM

## 2021-06-17 DIAGNOSIS — Z170 Estrogen receptor positive status [ER+]: Secondary | ICD-10-CM | POA: Diagnosis not present

## 2021-06-17 DIAGNOSIS — Z79899 Other long term (current) drug therapy: Secondary | ICD-10-CM | POA: Insufficient documentation

## 2021-06-17 DIAGNOSIS — Z923 Personal history of irradiation: Secondary | ICD-10-CM | POA: Insufficient documentation

## 2021-06-17 NOTE — Progress Notes (Signed)
Radiation Oncology         [phone] ________________________________  Follow-up New Visit   Outpatient   Name: Caitlin Greene MRN: [id number]  Date: 06/17/2021  DOB: [birth year]  PT:WSFK, Hunt Oris, MD  [name], MD   REFERRING PHYSICIAN: [name], MD  DIAGNOSIS: The primary encounter diagnosis was Metastatic breast cancer [facility]). A diagnosis of Metastatic cancer to bone [facility]) was also pertinent to this visit.  History of ductal carcinoma in situ diagnosed 30 years ago which was treated with right mastectomy and lymph node dissection at .   Bony metastatic adenocarcinoma of multiple sites (from breast cancer primary) diagnosed in 2021; s/p chemotherapy and currently on antiestrogens  Interval history/Narrative: The patient returns today to review MRI imaging. To review from her initial consultation on 05/31/21, we discussed how the cause of her bilateral hip pain was not explained on any recent imaging studies. Therefore, I suggested an MRI of the pelvis to better evaluate the extent of her metastatic disease.   Pelvic MRI on 06/10/21 demonstrated multiple mixed lytic and sclerotic lesions with mild heterogeneous enhancement in the pelvis, and partially visualized lumbar spine consistent with osseous metastases. Sites seen on MRI imaging include (but are not limited to) the right iliac wing, posterior right iliac bone, and left ischial tuberosity.  Lesions overall appeared similar in size in distribution in comparison to prior CT in December 2022. Intralesional fat was also appreciated in several of these lesions, which may reflect post treatment change. Lesions in the anterior right iliac bone and ischial tuberosity were also seen to demonstrate mildly reduced diffusion. (No lesions were appreciated in the proximal femurs).   In the interval since she was last seen, the patient also met with Dr. Marin Olp on 06/14/21. During which time, Dr. Marin Olp discussed continuing her  on Ibrance and Faslodex. The patient denied any cough, SOB, nausea, vomiting, leg swelling, or headache.  Dr. Marin Olp also reviewed her recent MRI and noted that findings were again hard to interpret in terms of active disease. She will return to Dr. Marin Olp in one month for routine follow-up.   HPI - Initial Consultation on 05/31/21 ::Caitlin Greene is a 56 y.o. female who is accompanied by no one. she is seen as a courtesy of Dr. Marin Olp for an opinion concerning palliative radiation therapy as part of management for her hip pain.  The patient has a history of ductal carcinoma in situ diagnosed 30 years ago which was treated with right mastectomy and lymph node dissection at [facility]. She did not receive radiation or chemotherapy. The patient developed multiple sites of bony metastatic disease in 2021 (treatment and HPI detailed below). Currently, she has no evidence of disease progression, though has right and left hip pain which is bothersome for her.  The patient presented to the ED on 10/24/2019 due to complaints of thoracic pain that wraps around to her upper abdomen, and rectal bleeding. The patient was seen at an Urgent Care shortly before this on 10/22/2019 and was suspected to have constipation based on an abdominal x-ray. Despite taking miralax, her pain persisted. Upon evaluation in the ED, the patient reported that laying on her right side would worsen her pain.   CT of the abdomen and pelvis performed for evaluation on 10/24/2019 revealed diffuse mixed lytic and sclerotic lesions throughout the included thoracolumbar spine and bony pelvis, including a pathologic burst fracture at the T10 level, with an observed 60% height loss and 8 mm of retropulsion resulting  in severe canal stenosis. An additional pathologic biconvex pincer deformity was also noted at the L2 level with up to 70% height loss maximally. Findings were noted as highly suspicious for diffuse osseous metastatic disease, most likely  from a recurrence of patient's prior breast cancer. Diffuse pancolonic diverticulosis without focal diverticular inflammation was also appreciated, and noted as a likely cause for the patient's rectal bleeding.   MRI of the lumbar spine on 10/24/2019 further revealed metastatic disease affecting L2 through L5. With the most advanced involvement seen at the L2 level where endplate collapse was appreciated on the left side, and some tumor extension through the posterior aspect of the vertebral body. No apparent neural compression was seen, however, a metastatic lesion was also noted within the right iliac bone. MRI of the thoracic spine also showed metastatic disease throughout the thoracic region. The most extensive involvement was appreciated at the T6 and T10 level, where the vertebral bodies appeared completely replaced by tumor, as well as early encroachment upon the ventral spinal canal but without cord compression. At T10, a loss of height of about 25% was seen, as well as a mild loss of height at T6 of about 10-20%. Bilateral early foraminal encroachment by tumor was also appreciated on the left at T5-6 and T6-7, and bilaterally at T9-10 and T10-11. In addition, extensive metastatic involvement of the T12 vertebral body was seen, and small metastatic deposits within T9, and likely early disease at T2. MRI of the cervical spine showed no evidence of metastatic disease.  Subsequently, the patient was transferred to [facility] in [place] for admission and consideration of spine surgery for management of the multiple areas of metastatic cancer. The patient was discharged to a skilled care nursing facility on 10/30/19 with instructions to follow up with breast cancer and medical oncology within 1 to 2 weeks for initiation of treatment and biopsy of the spine. While admitted to [facility], the patient was given jewett braces for her thoracic and lumbar spine and pain management.    Of note: the patient returned to the ED on 10/30/19 due to reports of inadequate care and conditions at the SNF.  Patient opted to undergo biopsies and kyphoplasty at T6, T10 and L2 on 11/12/2019 . Biopsies of T6 and L2 revealed metastatic adenocarcinoma; consistent with breast primary. The patient reported significant improvement in her pain following the kyphoplasty.   Subsequently, the patient consulted with Dr. Merrie Roof at [facility] on 11/20/19 to discuss treatment options. The patient agreed to proceed with systemic treatment consisting of anastrozole, palbociclib and denosumab. She also consented to starting anastrozole that following day.  MRI of the brain on 11/26/19 showed a focus of T1 hypointensity involving the rightward aspect of the clivus concerning for an osseous metastatic lesion. No acute intracranial findings or evidence for abnormal enhancement in the brain parenchyma were appreciated otherwise. CT of the head on 12/02/2019 again demonstrated the lesion in the right eccentric clivus as suspicious for an osseous metastatic lesion.   Whole body bone scan on 12/02/19 showed multifocal osseous metastatic disease involving the axial and appendicular skeleton, including focally increased uptake involving the base of the skull correlating with the clivus.   Whole body bone scan on 02/17/20 showed no new focal sites of osteoblastic metastatic disease.   CT of the chest abdomen and pelvis on 03/09/20 redemonstrated extensive osseous metastatic disease with progression of sclerosis (relative to CT 10/25/2019) as well as status post interval  cement augmentation of T6, T10, and L2 pathologic fractures. Increased sclerosis was noted as nonspecific and possibly secondary to treatment changes or disease progression.  The patient was then referred to Dr. Marin Olp on 04/07/20 for further evaluation. During this visit, Dr. Marin Olp reccommended a Faslodex/Ibrance combination for  antiestrogen treatment which was was agreeable to. The patient was also started on Xgeva every 3 months. During a follow up visit with Dr. Marin Olp on 08/06/20, the patient was noted to tolerate her antiestrogens well without any significant side effects. The patient has continued on antiestrogens without issues.  Subsequent imaging from December 2021 to present day have showed no further progression of bony metastatic disease.   Her most recent CT of the chest abdomen and pelvis on 04/29/21 showed the stable appearance and distribution of her various osseous metastatic lesions. No findings were seen as suggestive of active extra osseous malignancy. CT did show a left adrenal nodule (which was previously noted) with enhancement characteristics strongly favoring adrenal adenoma.   Most recent bone scan on 04/29/21 showed appreciable bony lesions primarily in the thoracic and lumbar spine. The small sclerotic lesions in the ribs and right iliac bone were not readily appreciable. No new lesions were identified otherwise.  During her most recent follow up with Dr. Marin Olp on 05/13/21, the patient reported bilateral hip pain, though her most recent bone scan really didn't show anything around this area. Given her pain, Dr. Marin Olp referred the patient to me for consideration of palliative radiation therapy in management of her hip pain.  Patient reports some bilateral hip pain and some mid back pan.  None of these areas are very significant.  She does take Tylenol.  She is hesitant to use narcotics secondary to the way they make her feel.  She occasionally will wake up at night with this discomfort.   PREVIOUS RADIATION THERAPY: No  PAST MEDICAL HISTORY:  Past Medical History:  Diagnosis Date   ALLERGIC RHINITIS    ANEMIA-IRON DEFICIENCY    ANXIETY    BREAST CANCER, HX OF 01/21/2007   at 56yo   DEPRESSION    GERD    HYPERLIPIDEMIA    Metastatic cancer to bone ([place]) dx'd 10/24/2019   recurrent  breast ca   RENAL CALCULUS    rt breast ca dx'd 1988    PAST SURGICAL HISTORY: Past Surgical History:  Procedure Laterality Date   BIOPSY  03/21/2020   Procedure: BIOPSY;  Surgeon: Thornton Park, MD;  Location: [facility];  Service: Gastroenterology;;   BREAST ENHANCEMENT SURGERY     ESOPHAGOGASTRODUODENOSCOPY (EGD) WITH PROPOFOL N/A 03/21/2020   Procedure: ESOPHAGOGASTRODUODENOSCOPY (EGD) WITH PROPOFOL;  Surgeon: Thornton Park, MD;  Location: [facility];  Service: Gastroenterology;  Laterality: N/A;   MASTECTOMY     right   TEMPOROMANDIBULAR JOINT SURGERY     Left   TONSILLECTOMY      FAMILY HISTORY:  Family History  Problem Relation Age of Onset   Colon polyps Mother    Cancer Mother        Uterine Cancer   Hypertension Mother    Dementia Mother    Colon polyps Father    COPD Father        smoked   Hypertension Other    Diabetes Other    Asthma Sister     SOCIAL HISTORY:  Social History   Tobacco Use   Smoking status: Never   Smokeless tobacco: Never  Vaping Use   Vaping Use: Never used  Substance Use  Topics   Alcohol use: No    Alcohol/week: 0.0 standard drinks   Drug use: No    ALLERGIES:  Allergies  Allergen Reactions   Amoxicillin-Pot Clavulanate Hives and Itching   Penicillins Hives, Itching and Other (See Comments)   Codeine Nausea And Vomiting    Needs pre-meds     MEDICATIONS:  Current Outpatient Medications  Medication Sig Dispense Refill   acetaminophen (TYLENOL) 650 MG CR tablet Take 650 mg by mouth every 8 (eight) hours as needed for pain.     azithromycin (ZITHROMAX Z-PAK) 250 MG tablet Take as directed 6 each 0   CALCIUM PO Take 1 tablet by mouth daily.     COVID-19 mRNA bivalent vaccine, Pfizer, (PFIZER COVID-19 VAC BIVALENT) injection Inject into the muscle. 0.3 mL 0   famciclovir (FAMVIR) 250 MG tablet Take 1 tablet (250 mg total) by mouth daily. 30 tablet 6   FLUoxetine (PROZAC) 20 MG capsule TAKE 3 CAPSULES(60 MG) BY  MOUTH DAILY 60 capsule 0   hydrOXYzine (ATARAX/VISTARIL) 25 MG tablet Take 25 mg by mouth 3 (three) times daily as needed for anxiety.     IBRANCE 100 MG tablet Take 100 mg by mouth See admin instructions. 164m daily on day 1-21 of cycle. 7 days off.  Repeat.     omeprazole (PRILOSEC) 20 MG capsule Take 20 mg by mouth daily.     ondansetron (ZOFRAN) 4 MG tablet Take 1 tablet (4 mg total) by mouth every 4 (four) hours as needed. 20 tablet 0   oxyCODONE (OXY IR/ROXICODONE) 5 MG immediate release tablet Take 1 tablet (5 mg total) by mouth every 8 (eight) hours as needed for severe pain. 50 tablet 0   polyethylene glycol (MIRALAX / GLYCOLAX) 17 g packet Take 17 g by mouth daily as needed. 28 each 0   SM SENNA LAXATIVE 8.6 MG tablet Take 8.6 mg by mouth daily.     vitamin B-12 (CYANOCOBALAMIN) 1000 MCG tablet Take 1 tablet (1,000 mcg total) by mouth daily. 30 tablet 1   sucralfate (CARAFATE) 1 GM/10ML suspension Take 10 mLs (1 g total) by mouth 4 (four) times daily -  with meals and at bedtime. (Patient not taking: Reported on 06/17/2021) 420 mL 6   No current facility-administered medications for this encounter.      PHYSICAL EXAM:  height is 5' 9"  (1.753 m) and weight is 197 lb 6.4 oz (89.5 kg). Her temperature is 97.2 F (36.2 C) (abnormal). Her blood pressure is 106/79 and her pulse is 86. Her respiration is 20 and oxygen saturation is 100%.       LABORATORY DATA:  Lab Results  Component Value Date   WBC 2.6 (L) 06/14/2021   HGB 11.9 (L) 06/14/2021   HCT 34.4 (L) 06/14/2021   MCV 107.2 (H) 06/14/2021   PLT 214 06/14/2021   NEUTROABS 1.1 (L) 06/14/2021   Lab Results  Component Value Date   NA 139 06/14/2021   K 3.7 06/14/2021   CL 104 06/14/2021   CO2 28 06/14/2021   GLUCOSE 142 (H) 06/14/2021   CREATININE 0.88 06/14/2021   CALCIUM 8.9 06/14/2021      RADIOGRAPHY: MR Pelvis W Wo Contrast  Result Date: 06/12/2021 CLINICAL DATA:  Metastatic disease evaluation EXAM: MRI PELVIS  WITHOUT AND WITH CONTRAST TECHNIQUE: Multiplanar multisequence MR imaging of the pelvis was performed both before and after administration of intravenous contrast. CONTRAST:  853mGADAVIST GADOBUTROL 1 MMOL/ML IV SOLN COMPARISON:  Bone scan 04/29/2021, CT 04/29/2021,  lumbar spine MRI 08/09/2020 FINDINGS: Urinary Tract:  No abnormality visualized. Bowel:  Sigmoid diverticulosis. Vascular/Lymphatic: No pathologically enlarged lymph nodes. No significant vascular abnormality seen. Reproductive:  Nabothian cysts are noted. Other:  None Musculoskeletal: There are multiple mixed lytic and sclerotic lesions with mild heterogeneous enhancement in the pelvis, for reference in the right iliac wing, posterior right iliac bone, and left ischial tuberosity (axial postcontrast images 20, 10 and 16, and 41 respectively). There are partially visualized lesions in the lumbar spine as seen on recent CT and prior lumbar spine MRI. Lesions are similar in size in distribution in comparison to prior CT in December 2022. There is intralesional fat in several of these lesions, which may reflect post treatment change. Lesions in the anterior right iliac bone and ischial tuberosity demonstrate mildly reduced diffusion. One of the right posterior iliac bone lesions does not appear to contain any intralesional fat (axial T1 image 10). There are no lesions in the proximal femurs. There is no evidence of acute fracture. There is mild bilateral hip osteoarthritis, right worse than left. There is mild tendinosis of the right gluteus minimus tendon at its insertion on the greater tuberosity. No trochanteric bursitis. The proximal hamstrings are intact. The hip adductors are intact. There is an area of fat necrosis in the left subcutaneous gluteal soft tissues. IMPRESSION: Multiple mixed lytic and sclerotic lesions with mild heterogeneous enhancement in the pelvis and partially visualized lumbar spine, as seen on recent CT in December 2022. These are  consistent with osseous metastases, several of which with probable posttreatment change. Electronically Signed   By: Maurine Simmering M.D.   On: 06/12/2021 11:24      IMPRESSION: History of ductal carcinoma in situ diagnosed 30 years ago which was treated with right mastectomy and lymph node dissection at [facility].   Bony metastatic adenocarcinoma of multiple sites (from breast cancer primary) diagnosed in 2021; s/p chemotherapy and currently on antiestrogens  We reviewed the patient's MRI in detail.  I showed her images as well as her bone scan.  Lesions on MRI are very subtle enough in the is of immediate concern to warrant radiation therapy.  In reviewing the patient's bone scan most significant uptake is in the mid back along the lower thoracic spine.  We discussed palliative radiation therapy to these 3 areas that they in the right pelvis and left pelvis as well as the lower thoracic spine.  I discussed potential side effects with this treatment.  In reviewing the patient's recent bone scan and MRI of the pelvis the Ibrance and Faslodex seems to be working fairly well for the patient.  Given the lack of significant activity on her recent imaging and her minimal symptomatology would be more in favor of holding off on relative radiation therapy.  She is in agreement but understands she can call us for pain worsens in any of these areas and we could initiate palliative radiation therapy quickly.   PLAN: As needed follow-up in radiation oncology.  The patient will continue close follow-up in medical oncology with Dr. Marin Olp.   15 minutes of total time was spent for this patient encounter, including preparation, face-to-face counseling with the patient and coordination of care, physical exam, and documentation of the encounter.  ------------------------------------------------  Blair Promise, PhD, MD  This document serves as a record of services personally performed by Gery Pray, MD. It was  created on his behalf by Roney Mans, a trained medical scribe. The creation of this record is  based on the scribe's personal observations and the provider's statements to them. This document has been checked and approved by the attending provider.

## 2021-06-17 NOTE — Progress Notes (Signed)
See MD note for nursing evaluation. °

## 2021-06-25 ENCOUNTER — Ambulatory Visit: Payer: BC Managed Care – PPO | Admitting: Internal Medicine

## 2021-06-25 ENCOUNTER — Other Ambulatory Visit: Payer: Self-pay

## 2021-06-25 VITALS — BP 118/72 | HR 74 | Temp 97.8°F | Ht 69.0 in | Wt 196.5 lb

## 2021-06-25 DIAGNOSIS — F411 Generalized anxiety disorder: Secondary | ICD-10-CM

## 2021-06-25 DIAGNOSIS — Z0001 Encounter for general adult medical examination with abnormal findings: Secondary | ICD-10-CM | POA: Diagnosis not present

## 2021-06-25 DIAGNOSIS — E7800 Pure hypercholesterolemia, unspecified: Secondary | ICD-10-CM

## 2021-06-25 DIAGNOSIS — Z1211 Encounter for screening for malignant neoplasm of colon: Secondary | ICD-10-CM | POA: Diagnosis not present

## 2021-06-25 DIAGNOSIS — E538 Deficiency of other specified B group vitamins: Secondary | ICD-10-CM | POA: Diagnosis not present

## 2021-06-25 DIAGNOSIS — J329 Chronic sinusitis, unspecified: Secondary | ICD-10-CM | POA: Diagnosis not present

## 2021-06-25 MED ORDER — OMEPRAZOLE 20 MG PO CPDR: 20.0000 mg | DELAYED_RELEASE_CAPSULE | Freq: Every day | ORAL | 3 refills | Status: DC

## 2021-06-25 MED ORDER — DOXYCYCLINE HYCLATE 100 MG PO TABS: 100.0000 mg | ORAL_TABLET | Freq: Two times a day (BID) | ORAL | 0 refills | Status: DC

## 2021-06-25 MED ORDER — FLUOXETINE HCL 20 MG PO CAPS: ORAL_CAPSULE | ORAL | 3 refills | Status: DC

## 2021-06-25 NOTE — Progress Notes (Signed)
Patient ID: Caitlin Greene, female   DOB: 04-17-1966, 56 y.o.   MRN: [id number]         Chief Complaint:: wellness exam and Medication Refill  , acute sinus infection and low b12       HPI:  Caitlin Greene is a 56 y.o. female here for wellness exam; plans to f/u herself for GYN and mammogram, delcines flu shot, shingrix, tdap and colonoscopy for now                        Also  Here with 2-3 days acute onset fever, facial pain, pressure, headache, general weakness and malaise, and greenish d/c, with mild ST and cough, but pt denies chest pain, wheezing, increased sob or doe, orthopnea, PND, increased LE swelling, palpitations, dizziness or syncope.   Pt denies polydipsia, polyuria, or new focal neuro s/s.  Not taking B12  Denies worsening depressive symptoms, suicidal ideation, or panic; has ongoing anxiety, not increased recently.    Wt Readings from Last 3 Encounters:  06/25/21 196 lb 8 oz (89.1 kg)  06/17/21 197 lb 6.4 oz (89.5 kg)  06/14/21 194 lb (88 kg)   BP Readings from Last 3 Encounters:  06/25/21 118/72  06/17/21 106/79  06/14/21 105/73   Immunization History  Administered Date(s) Administered   Influenza Split 03/17/2011   Influenza Whole 03/02/2009   Influenza, Seasonal, Injecte, Preservative Fre 04/07/2013   Influenza,inj,Quad PF,6+ Mos 04/08/2014, 01/22/2015, 03/23/2017, 03/27/2020   Influenza-Unspecified 02/19/2016, 03/23/2018   PFIZER(Purple Top)SARS-COV-2 Vaccination 07/29/2019, 08/26/2019, 02/07/2020   Pfizer Covid-19 Vaccine Bivalent Booster 52yrs & up 03/15/2021   Pneumococcal Conjugate-13 07/08/2014   Td 05/31/2010   Tdap 11/10/2016   There are no preventive care reminders to display for this patient.     Past Medical History:  Diagnosis Date   ALLERGIC RHINITIS    ANEMIA-IRON DEFICIENCY    ANXIETY    BREAST CANCER, HX OF 01/21/2007   at 56yo   DEPRESSION    GERD    HYPERLIPIDEMIA    Metastatic cancer to bone ([place]) dx'd 10/24/2019   recurrent breast ca    RENAL CALCULUS    rt breast ca dx'd 1988   Past Surgical History:  Procedure Laterality Date   BIOPSY  03/21/2020   Procedure: BIOPSY;  Surgeon: Thornton Park, MD;  Location: [facility];  Service: Gastroenterology;;   BREAST ENHANCEMENT SURGERY     ESOPHAGOGASTRODUODENOSCOPY (EGD) WITH PROPOFOL N/A 03/21/2020   Procedure: ESOPHAGOGASTRODUODENOSCOPY (EGD) WITH PROPOFOL;  Surgeon: Thornton Park, MD;  Location: [facility];  Service: Gastroenterology;  Laterality: N/A;   MASTECTOMY     right   TEMPOROMANDIBULAR JOINT SURGERY     Left   TONSILLECTOMY      reports that she has never smoked. She has never used smokeless tobacco. She reports that she does not drink alcohol and does not use drugs. family history includes Asthma in her sister; COPD in her father; Cancer in her mother; Colon polyps in her father and mother; Dementia in her mother; Diabetes in an other family member; Hypertension in her mother and another family member. Allergies  Allergen Reactions   Amoxicillin-Pot Clavulanate Hives and Itching   Penicillins Hives, Itching and Other (See Comments)   Codeine Nausea And Vomiting    Needs pre-meds    Current Outpatient Medications on File Prior to Visit  Medication Sig Dispense Refill   acetaminophen (TYLENOL) 650 MG CR tablet Take 650 mg by mouth every 8 (eight) hours  as needed for pain.     CALCIUM PO Take 1 tablet by mouth daily.     COVID-19 mRNA bivalent vaccine, Pfizer, (PFIZER COVID-19 VAC BIVALENT) injection Inject into the muscle. 0.3 mL 0   famciclovir (FAMVIR) 250 MG tablet Take 1 tablet (250 mg total) by mouth daily. 30 tablet 6   hydrOXYzine (ATARAX/VISTARIL) 25 MG tablet Take 25 mg by mouth 3 (three) times daily as needed for anxiety.     IBRANCE 100 MG tablet Take 100 mg by mouth See admin instructions. 100mg  daily on day 1-21 of cycle. 7 days off.  Repeat.     ondansetron (ZOFRAN) 4 MG tablet Take 1 tablet (4 mg total) by mouth every 4 (four) hours  as needed. 20 tablet 0   oxyCODONE (OXY IR/ROXICODONE) 5 MG immediate release tablet Take 1 tablet (5 mg total) by mouth every 8 (eight) hours as needed for severe pain. 50 tablet 0   polyethylene glycol (MIRALAX / GLYCOLAX) 17 g packet Take 17 g by mouth daily as needed. 28 each 0   SM SENNA LAXATIVE 8.6 MG tablet Take 8.6 mg by mouth daily.     vitamin B-12 (CYANOCOBALAMIN) 1000 MCG tablet Take 1 tablet (1,000 mcg total) by mouth daily. 30 tablet 1   No current facility-administered medications on file prior to visit.        ROS:  All others reviewed and negative.  Objective        PE:  BP 118/72    Pulse 74    Temp 97.8 F (36.6 C) (Oral)    Ht 5\' 9"  (1.753 m)    Wt 196 lb 8 oz (89.1 kg)    LMP 06/09/2017 (Exact Date)    SpO2 94%    BMI 29.02 kg/m                 Constitutional: Pt appears in NAD               HENT: Head: NCAT.                Right Ear: External ear normal.                 Left Ear: External ear normal. Bilat tm's with mild erythema.  Max sinus areas mild tender.  Pharynx with mild erythema, no exudate               Eyes: . Pupils are equal, round, and reactive to light. Conjunctivae and EOM are normal               Nose: without d/c or deformity               Neck: Neck supple. Gross normal ROM               Cardiovascular: Normal rate and regular rhythm.                 Pulmonary/Chest: Effort normal and breath sounds without rales or wheezing.                Abd:  Soft, NT, ND, + BS, no organomegaly               Neurological: Pt is alert. At baseline orientation, motor grossly intact               Skin: Skin is warm. No rashes, no other new lesions, LE edema - none  Psychiatric: Pt behavior is normal without agitation   Micro: none  Cardiac tracings I have personally interpreted today:  none  Pertinent Radiological findings (summarize): none   Lab Results  Component Value Date   WBC 2.6 (L) 06/14/2021   HGB 11.9 (L) 06/14/2021   HCT 34.4  (L) 06/14/2021   PLT 214 06/14/2021   GLUCOSE 142 (H) 06/14/2021   CHOL 212 (H) 04/12/2018   TRIG 229.0 (H) 04/12/2018   HDL 52.00 04/12/2018   LDLDIRECT 141.0 04/12/2018   LDLCALC 129 (H) 04/12/2013   ALT 10 06/14/2021   AST 11 (L) 06/14/2021   NA 139 06/14/2021   K 3.7 06/14/2021   CL 104 06/14/2021   CREATININE 0.88 06/14/2021   BUN 11 06/14/2021   CO2 28 06/14/2021   TSH 3.461 03/21/2020   HGBA1C 5.5 04/21/2014   Assessment/Plan:  Caitlin Greene is a 56 y.o. White or Caucasian [1] female with  has a past medical history of ALLERGIC RHINITIS, ANEMIA-IRON DEFICIENCY, ANXIETY, BREAST CANCER, HX OF (01/21/2007), DEPRESSION, GERD, HYPERLIPIDEMIA, Metastatic cancer to bone ([place]) (dx'd 10/24/2019), RENAL CALCULUS, and rt breast ca (dx'd 1988).  B12 deficiency Lab Results  Component Value Date   VITAMINB12 134 (L) 03/21/2020   Low, to start oral replacement - b12 1000 mcg qd   Encounter for well adult exam with abnormal findings Age and sex appropriate education and counseling updated with regular exercise and diet Referrals for preventative services - pt to f/u herself with GYN and mammogram, declines colonoscopy for now Immunizations addressed - declines flu shot, shingirx, tdap Smoking counseling  - none needed Evidence for depression or other mood disorder - none significant Most recent labs reviewed. I have personally reviewed and have noted: 1) the patient's medical and social history 2) The patient's current medications and supplements 3) The patient's height, weight, and BMI have been recorded in the chart   Chronic sinusitis With acute flare - Mild to mod, for antibx course,  to f/u any worsening symptoms or concerns  HLD (hyperlipidemia) Lab Results  Component Value Date   LDLCALC 129 (H) 04/12/2013   uncontrolled, pt to continue current low chol diet, declines statin for now or lab f/u today   Anxiety state Overall stable on prozac 60 qd,  to f/u any  worsening symptoms or concerns  Followup: Return in about 1 year (around 06/25/2022).  Cathlean Cower, MD 06/26/2021 5:47 AM [facility]

## 2021-06-25 NOTE — Assessment & Plan Note (Signed)
Lab Results  Component Value Date   VITAMINB12 134 (L) 03/21/2020   Low, to start oral replacement - b12 1000 mcg qd

## 2021-06-25 NOTE — Patient Instructions (Addendum)
Please remember to see your yearly GYN appointment  Please check on the insurance coverage for the Shingrix shingles shot   Please take all new medication as prescribed - the antibiotic  Please also take OTC B12 1000 mcg per day for 6 months  Please continue all other medications as before, and refills have been done if requested.  Please have the pharmacy call with any other refills you may need.  Please continue your efforts at being more active, low cholesterol diet, and weight control.  You are otherwise up to date with prevention measures today.  Please keep your appointments with your specialists as you may have planned - oncology  We can hold on further lab testing today  Please make an Appointment to return for your 1 year visit, or sooner if needed

## 2021-06-26 ENCOUNTER — Encounter: Payer: Self-pay | Admitting: Internal Medicine

## 2021-06-26 NOTE — Assessment & Plan Note (Signed)
With acute flare - Mild to mod, for antibx course,  to f/u any worsening symptoms or concerns 

## 2021-06-26 NOTE — Assessment & Plan Note (Signed)
Age and sex appropriate education and counseling updated with regular exercise and diet Referrals for preventative services - pt to f/u herself with GYN and mammogram, declines colonoscopy for now Immunizations addressed - declines flu shot, shingirx, tdap Smoking counseling  - none needed Evidence for depression or other mood disorder - none significant Most recent labs reviewed. I have personally reviewed and have noted: 1) the patient's medical and social history 2) The patient's current medications and supplements 3) The patient's height, weight, and BMI have been recorded in the chart

## 2021-06-26 NOTE — Assessment & Plan Note (Signed)
Lab Results  Component Value Date   LDLCALC 129 (H) 04/12/2013   uncontrolled, pt to continue current low chol diet, declines statin for now or lab f/u today

## 2021-06-26 NOTE — Assessment & Plan Note (Signed)
Overall stable on prozac 60 qd,  to f/u any worsening symptoms or concerns

## 2021-07-11 IMAGING — CT CT HEAD WO/W CM
3 of 4 series · 15 of 47 positions shown, 18 images · IV contrast (OMNIPAQUE 350)
Comparison: Cervical MRI 10/24/2019. Bone scan 02/17/2020. Head CT
12/02/2019

CLINICAL DATA: History of skull base metastatic disease.
Generalized weakness over the last 2 weeks. Metastatic breast
cancer.

EXAM:
CT HEAD WITHOUT AND WITH CONTRAST
TECHNIQUE: Contiguous axial images were obtained from the base of the skull
through the vertex without and with intravenous contrast
CONTRAST:  100mL OMNIPAQUE IOHEXOL 350 MG/ML SOLN

[Series 7: head w · axial · 0.47mm/px · z∈[-123,+12]mm · 9 of 33 slices shown, 12 images]
[im 3/33  brain]
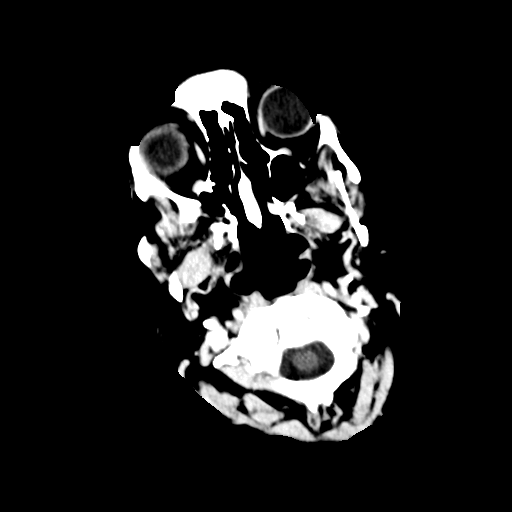
[im 3/33  bone]
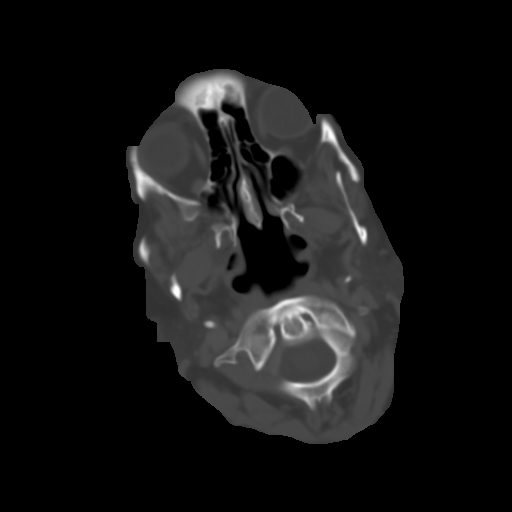
[im 7/33  brain]
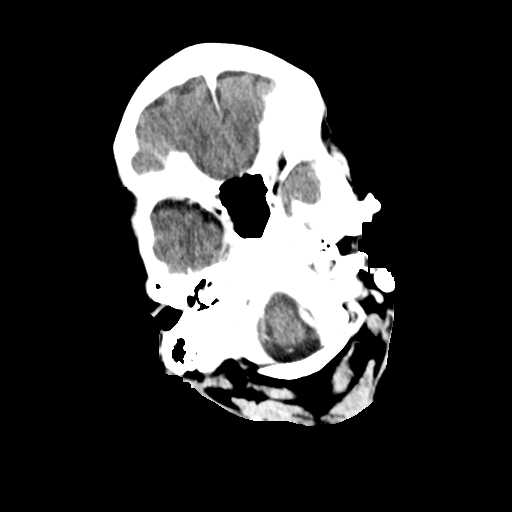
[im 9/33  brain]
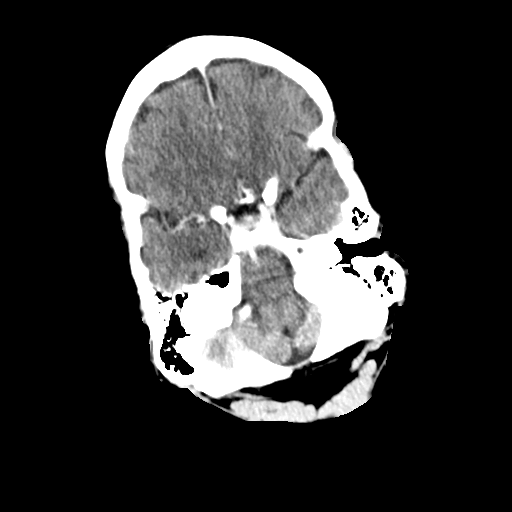
[im 13/33  brain]
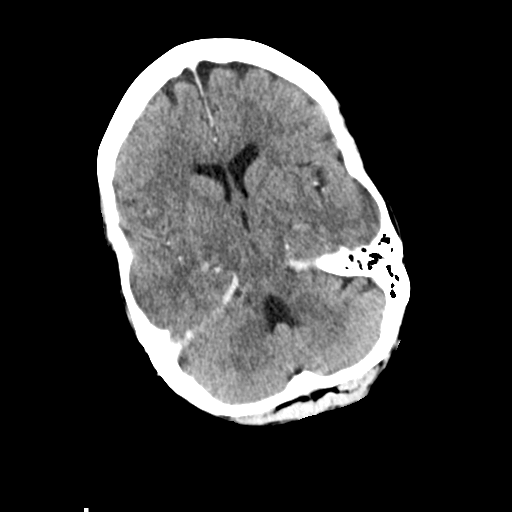
[im 18/33  brain]
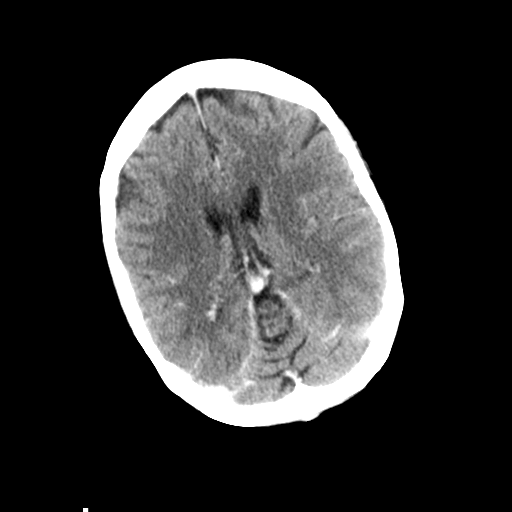
[im 18/33  bone]
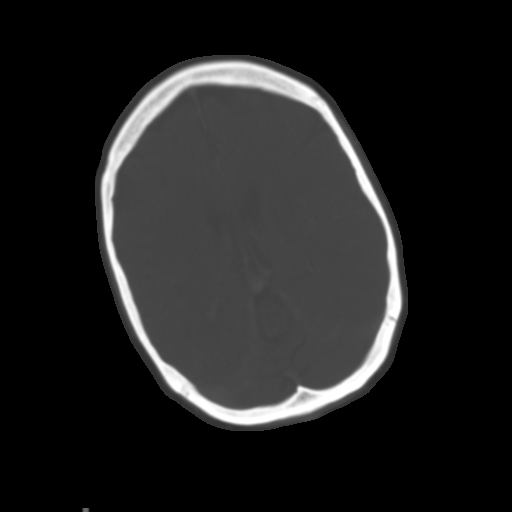
[im 20/33  brain]
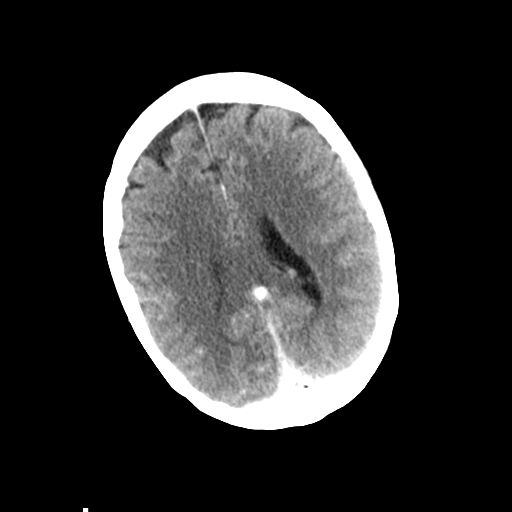
[im 24/33  brain]
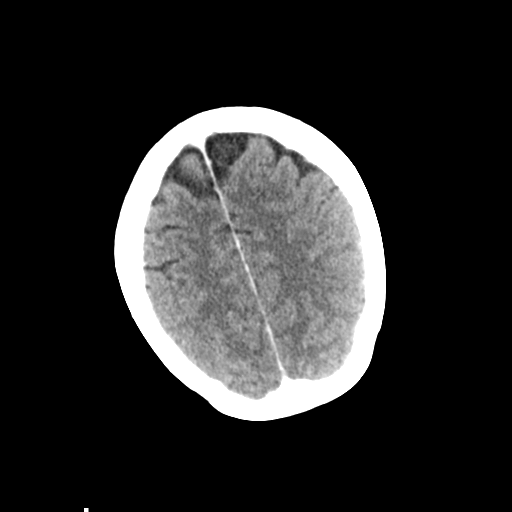
[im 26/33  brain]
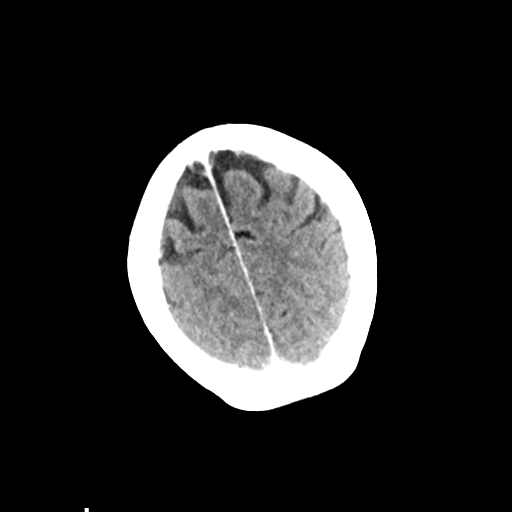
[im 30/33  brain]
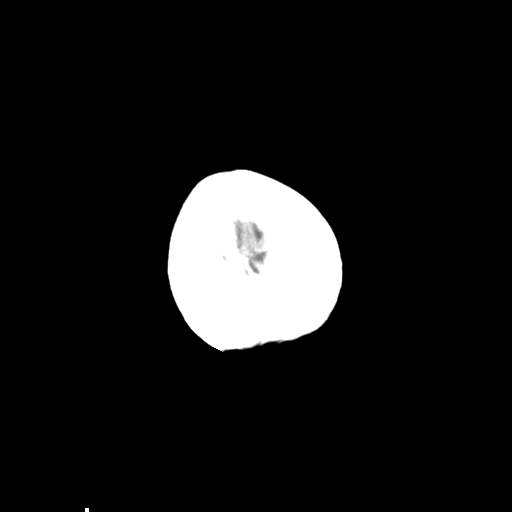
[im 30/33  bone]
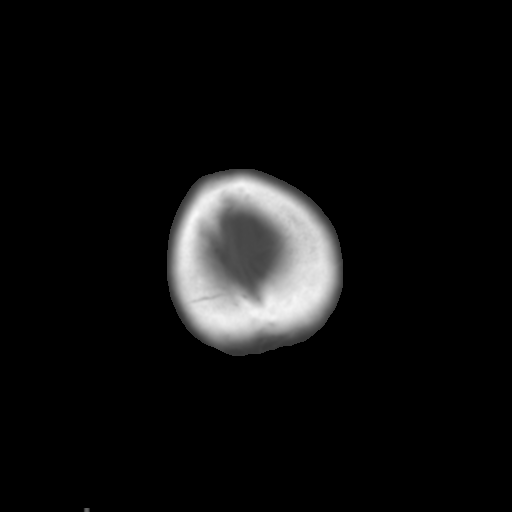

[Series 8: coronal soft tissue · coronal · 0.31mm/px · 3 of 67 slices shown]
[im 23/67  brain]
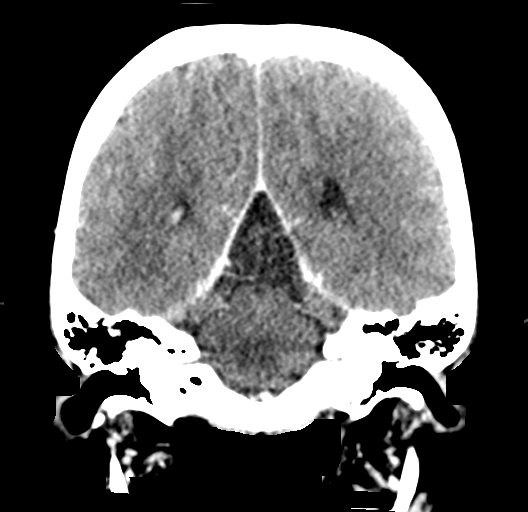
[im 30/67  brain]
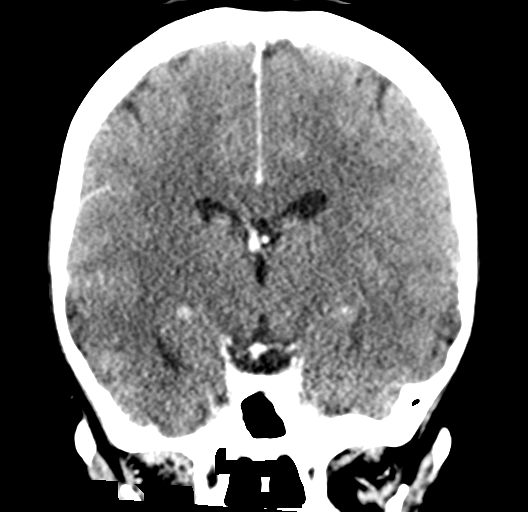
[im 37/67  brain]
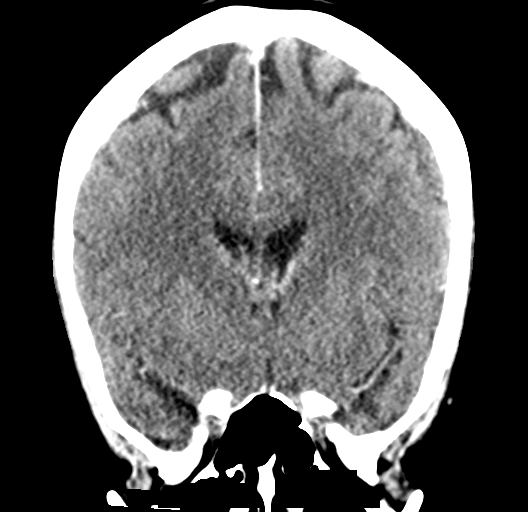

[Series 9: sagittal soft tissue · sagittal · 0.31mm/px · 3 of 55 slices shown]
[im 19/55  brain]
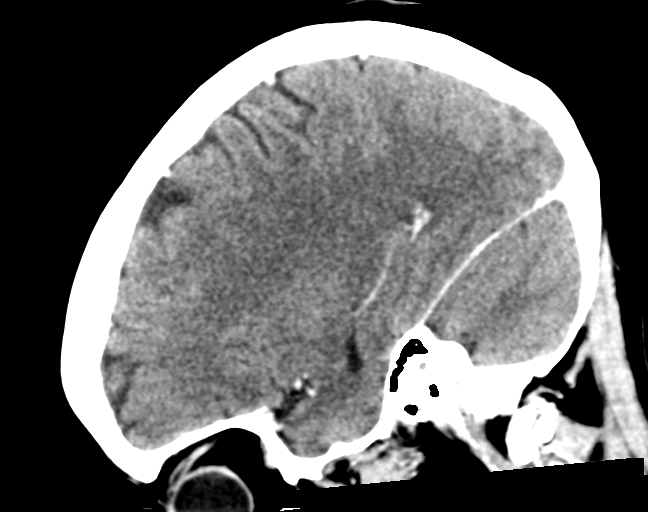
[im 28/55  brain]
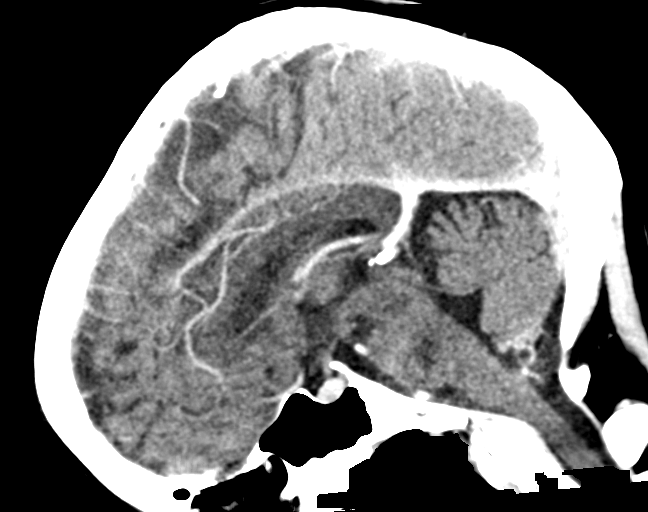
[im 37/55  brain]
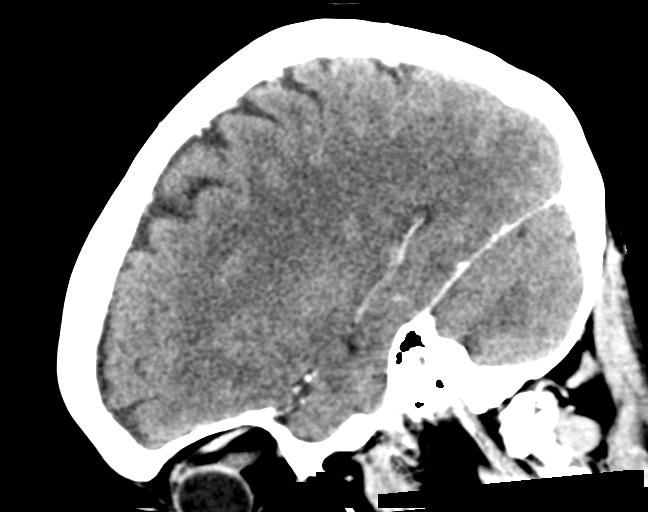

[15 of 47 positions shown; findings below may reference images not displayed]

FINDINGS: Brain: The brain has normal appearance without evidence of atrophy,
old or recent infarction, mass lesion, hemorrhage, hydrocephalus or
extra-axial collection. No abnormal contrast enhancement occurs.

Vascular: No abnormal vascular finding.

Skull: 13 x 9 x 9 mm sclerotic focus within the right side of the
clivus without evidence lytic bone destruction or extraosseous
tumor. This could be related to bony metastatic disease or could be
a focus of fibrous dysplasia. No second regional lesion is
identified.

Sinuses/Orbits: Clear sinuses as seen. Limited orbital visualization
is negative.

Other: None
IMPRESSION: 1. 13 x 9 x 9 mm sclerotic focus within the right side of the clivus
without evidence of lytic bone destruction or extraosseous tumor.
This could be related to bony metastatic disease or could be a focus
of fibrous dysplasia. No second regional lesion is identified.
2. Normal appearance of the brain itself.

## 2021-07-12 ENCOUNTER — Inpatient Hospital Stay: Payer: BC Managed Care – PPO | Attending: Hematology & Oncology

## 2021-07-12 ENCOUNTER — Other Ambulatory Visit: Payer: Self-pay

## 2021-07-12 ENCOUNTER — Inpatient Hospital Stay: Payer: BC Managed Care – PPO

## 2021-07-12 ENCOUNTER — Inpatient Hospital Stay: Payer: BC Managed Care – PPO | Admitting: Hematology & Oncology

## 2021-07-12 ENCOUNTER — Encounter: Payer: Self-pay | Admitting: Hematology & Oncology

## 2021-07-12 VITALS — BP 120/66 | HR 82 | Temp 97.6°F | Resp 17 | Wt 197.8 lb

## 2021-07-12 DIAGNOSIS — C50911 Malignant neoplasm of unspecified site of right female breast: Secondary | ICD-10-CM | POA: Insufficient documentation

## 2021-07-12 DIAGNOSIS — C7951 Secondary malignant neoplasm of bone: Secondary | ICD-10-CM | POA: Diagnosis not present

## 2021-07-12 DIAGNOSIS — C50919 Malignant neoplasm of unspecified site of unspecified female breast: Secondary | ICD-10-CM

## 2021-07-12 DIAGNOSIS — Z79818 Long term (current) use of other agents affecting estrogen receptors and estrogen levels: Secondary | ICD-10-CM | POA: Insufficient documentation

## 2021-07-12 LAB — CBC WITH DIFFERENTIAL (CANCER CENTER ONLY)
Abs Immature Granulocytes: 0.01 10*3/uL (ref 0.00–0.07)
Basophils Absolute: 0 10*3/uL (ref 0.0–0.1)
Basophils Relative: 2 %
Eosinophils Absolute: 0 10*3/uL (ref 0.0–0.5)
Eosinophils Relative: 1 %
HCT: 32.3 % — ABNORMAL LOW (ref 36.0–46.0)
Hemoglobin: 11.6 g/dL — ABNORMAL LOW (ref 12.0–15.0)
Immature Granulocytes: 0 %
Lymphocytes Relative: 41 %
Lymphs Abs: 1 10*3/uL (ref 0.7–4.0)
MCH: 38 pg — ABNORMAL HIGH (ref 26.0–34.0)
MCHC: 35.9 g/dL (ref 30.0–36.0)
MCV: 105.9 fL — ABNORMAL HIGH (ref 80.0–100.0)
Monocytes Absolute: 0.3 10*3/uL (ref 0.1–1.0)
Monocytes Relative: 11 %
Neutro Abs: 1.1 10*3/uL — ABNORMAL LOW (ref 1.7–7.7)
Neutrophils Relative %: 45 %
Platelet Count: 159 10*3/uL (ref 150–400)
RBC: 3.05 MIL/uL — ABNORMAL LOW (ref 3.87–5.11)
RDW: 14.6 % (ref 11.5–15.5)
WBC Count: 2.5 10*3/uL — ABNORMAL LOW (ref 4.0–10.5)
nRBC: 0 % (ref 0.0–0.2)

## 2021-07-12 LAB — CMP (CANCER CENTER ONLY)
ALT: 10 U/L (ref 0–44)
AST: 11 U/L — ABNORMAL LOW (ref 15–41)
Albumin: 4 g/dL (ref 3.5–5.0)
Alkaline Phosphatase: 43 U/L (ref 38–126)
Anion gap: 7 (ref 5–15)
BUN: 15 mg/dL (ref 6–20)
CO2: 26 mmol/L (ref 22–32)
Calcium: 8.7 mg/dL — ABNORMAL LOW (ref 8.9–10.3)
Chloride: 103 mmol/L (ref 98–111)
Creatinine: 0.96 mg/dL (ref 0.44–1.00)
GFR, Estimated: >60 mL/min (ref >60)
Glucose, Bld: 83 mg/dL (ref 70–99)
Potassium: 3.8 mmol/L (ref 3.5–5.1)
Sodium: 136 mmol/L (ref 135–145)
Total Bilirubin: 0.6 mg/dL (ref 0.3–1.2)
Total Protein: 6.6 g/dL (ref 6.5–8.1)

## 2021-07-12 MED ORDER — FULVESTRANT 250 MG/5ML IM SOSY
500.0000 mg | PREFILLED_SYRINGE | INTRAMUSCULAR | Status: DC
  Administered 2021-07-12: 500 mg via INTRAMUSCULAR
  Filled 2021-07-12: qty 10

## 2021-07-12 NOTE — Patient Instructions (Signed)
Fulvestrant injection °What is this medication? °FULVESTRANT (ful VES trant) blocks the effects of estrogen. It is used to treat breast cancer. °This medicine may be used for other purposes; ask your health care provider or pharmacist if you have questions. °COMMON BRAND NAME(S): FASLODEX °What should I tell my care team before I take this medication? °They need to know if you have any of these conditions: °bleeding disorders °liver disease °low blood counts, like low white cell, platelet, or red cell counts °an unusual or allergic reaction to fulvestrant, other medicines, foods, dyes, or preservatives °pregnant or trying to get pregnant °breast-feeding °How should I use this medication? °This medicine is for injection into a muscle. It is usually given by a health care professional in a hospital or clinic setting. °Talk to your pediatrician regarding the use of this medicine in children. Special care may be needed. °Overdosage: If you think you have taken too much of this medicine contact a poison control center or emergency room at once. °NOTE: This medicine is only for you. Do not share this medicine with others. °What if I miss a dose? °It is important not to miss your dose. Call your doctor or health care professional if you are unable to keep an appointment. °What may interact with this medication? °medicines that treat or prevent blood clots like warfarin, enoxaparin, dalteparin, apixaban, dabigatran, and rivaroxaban °This list may not describe all possible interactions. Give your health care provider a list of all the medicines, herbs, non-prescription drugs, or dietary supplements you use. Also tell them if you smoke, drink alcohol, or use illegal drugs. Some items may interact with your medicine. °What should I watch for while using this medication? °Your condition will be monitored carefully while you are receiving this medicine. You will need important blood work done while you are taking this  medicine. °Do not become pregnant while taking this medicine or for at least 1 year after stopping it. Women of child-bearing potential will need to have a negative pregnancy test before starting this medicine. Women should inform their doctor if they wish to become pregnant or think they might be pregnant. There is a potential for serious side effects to an unborn child. Men should inform their doctors if they wish to father a child. This medicine may lower sperm counts. Talk to your health care professional or pharmacist for more information. Do not breast-feed an infant while taking this medicine or for 1 year after the last dose. °What side effects may I notice from receiving this medication? °Side effects that you should report to your doctor or health care professional as soon as possible: °allergic reactions like skin rash, itching or hives, swelling of the face, lips, or tongue °feeling faint or lightheaded, falls °pain, tingling, numbness, or weakness in the legs °signs and symptoms of infection like fever or chills; cough; flu-like symptoms; sore throat °vaginal bleeding °Side effects that usually do not require medical attention (report to your doctor or health care professional if they continue or are bothersome): °aches, pains °constipation °diarrhea °headache °hot flashes °nausea, vomiting °pain at site where injected °stomach pain °This list may not describe all possible side effects. Call your doctor for medical advice about side effects. You may report side effects to FDA at 1-800-FDA-1088. °Where should I keep my medication? °This drug is given in a hospital or clinic and will not be stored at home. °NOTE: This sheet is a summary. It may not cover all possible information. If you have   questions about this medicine, talk to your doctor, pharmacist, or health care provider. °© 2022 [employer] (2017-08-08 00:00:00) °Denosumab injection °What is this medication? °DENOSUMAB (den oh sue mab)  slows bone breakdown. Prolia is used to treat osteoporosis in women after menopause and in men, and in people who are taking corticosteroids for 6 months or more. Xgeva is used to treat a high calcium level due to cancer and to prevent bone fractures and other bone problems caused by multiple myeloma or cancer bone metastases. Xgeva is also used to treat giant cell tumor of the bone. °This medicine may be used for other purposes; ask your health care provider or pharmacist if you have questions. °COMMON BRAND NAME(S): Prolia, XGEVA °What should I tell my care team before I take this medication? °They need to know if you have any of these conditions: °dental disease °having surgery or tooth extraction °infection °kidney disease °low levels of calcium or Vitamin D in the blood °malnutrition °on hemodialysis °skin conditions or sensitivity °thyroid or parathyroid disease °an unusual reaction to denosumab, other medicines, foods, dyes, or preservatives °pregnant or trying to get pregnant °breast-feeding °How should I use this medication? °This medicine is for injection under the skin. It is given by a health care professional in a hospital or clinic setting. °A special MedGuide will be given to you before each treatment. Be sure to read this information carefully each time. °For Prolia, talk to your pediatrician regarding the use of this medicine in children. Special care may be needed. For Xgeva, talk to your pediatrician regarding the use of this medicine in children. While this drug may be prescribed for children as young as 13 years for selected conditions, precautions do apply. °Overdosage: If you think you have taken too much of this medicine contact a poison control center or emergency room at once. °NOTE: This medicine is only for you. Do not share this medicine with others. °What if I miss a dose? °It is important not to miss your dose. Call your doctor or health care professional if you are unable to keep an  appointment. °What may interact with this medication? °Do not take this medicine with any of the following medications: °other medicines containing denosumab °This medicine may also interact with the following medications: °medicines that lower your chance of fighting infection °steroid medicines like prednisone or cortisone °This list may not describe all possible interactions. Give your health care provider a list of all the medicines, herbs, non-prescription drugs, or dietary supplements you use. Also tell them if you smoke, drink alcohol, or use illegal drugs. Some items may interact with your medicine. °What should I watch for while using this medication? °Visit your doctor or health care professional for regular checks on your progress. Your doctor or health care professional may order blood tests and other tests to see how you are doing. °Call your doctor or health care professional for advice if you get a fever, chills or sore throat, or other symptoms of a cold or flu. Do not treat yourself. This drug may decrease your body's ability to fight infection. Try to avoid being around people who are sick. °You should make sure you get enough calcium and vitamin D while you are taking this medicine, unless your doctor tells you not to. Discuss the foods you eat and the vitamins you take with your health care professional. °See your dentist regularly. Brush and floss your teeth as directed. Before you have any dental work done, tell   your dentist you are receiving this medicine. °Do not become pregnant while taking this medicine or for 5 months after stopping it. Talk with your doctor or health care professional about your birth control options while taking this medicine. Women should inform their doctor if they wish to become pregnant or think they might be pregnant. There is a potential for serious side effects to an unborn child. Talk to your health care professional or pharmacist for more information. °What side  effects may I notice from receiving this medication? °Side effects that you should report to your doctor or health care professional as soon as possible: °allergic reactions like skin rash, itching or hives, swelling of the face, lips, or tongue °bone pain °breathing problems °dizziness °jaw pain, especially after dental work °redness, blistering, peeling of the skin °signs and symptoms of infection like fever or chills; cough; sore throat; pain or trouble passing urine °signs of low calcium like fast heartbeat, muscle cramps or muscle pain; pain, tingling, numbness in the hands or feet; seizures °unusual bleeding or bruising °unusually weak or tired °Side effects that usually do not require medical attention (report to your doctor or health care professional if they continue or are bothersome): °constipation °diarrhea °headache °joint pain °loss of appetite °muscle pain °runny nose °tiredness °upset stomach °This list may not describe all possible side effects. Call your doctor for medical advice about side effects. You may report side effects to FDA at 1-800-FDA-1088. °Where should I keep my medication? °This medicine is only given in a clinic, doctor's office, or other health care setting and will not be stored at home. °NOTE: This sheet is a summary. It may not cover all possible information. If you have questions about this medicine, talk to your doctor, pharmacist, or health care provider. °© 2022 [employer] (2017-09-01 00:00:00) ° °

## 2021-07-12 NOTE — Progress Notes (Signed)
?Hematology and Oncology Follow Up Visit ? ?Caitlin Greene ?[id number] ?03/02/1966 56 y.o. ?07/12/2021 ? ? ?Principle Diagnosis:  ?Metastatic breast cancer-ER positive/HER-2 negative --bone metastasis only ? ?Current Therapy:   ?Faslodex 500 mg IM monthly --start on 04/2020 ?Ibrance 100 mg p.o. daily (21d on/7d off) - start on 04/2020 ?Xgeva 120 mg subcu every 3 months -next dose 04//2023 ?    ?Interim History:  Caitlin Greene is in for follow-up.  Overall, she is doing quite well.  She did see Dr. Sondra Come of Radiation Oncology.  They decided that she did not need any radiation therapy for the hip areas.  She would just be watched. ? ?She still has some discomfort.  She says this is manageable. ? ?Her last CA 27.29 was still holding steady at 47. ? ?She has had no problems with cough or shortness of breath.  She has had no visual changes.  She has had no nausea or vomiting.  There is no change in bowel or bladder habits.  She has had no leg swelling.  She has had no rashes. ? ?Overall, I would say performance status is probably ECOG 1.   ? ? ?Medications:  ?Current Outpatient Medications:  ?  acetaminophen (TYLENOL) 650 MG CR tablet, Take 650 mg by mouth every 8 (eight) hours as needed for pain., Disp: , Rfl:  ?  CALCIUM PO, Take 1 tablet by mouth daily., Disp: , Rfl:  ?  COVID-19 mRNA bivalent vaccine, Pfizer, (PFIZER COVID-19 VAC BIVALENT) injection, Inject into the muscle., Disp: 0.3 mL, Rfl: 0 ?  doxycycline (VIBRA-TABS) 100 MG tablet, Take 1 tablet (100 mg total) by mouth 2 (two) times daily., Disp: 20 tablet, Rfl: 0 ?  famciclovir (FAMVIR) 250 MG tablet, Take 1 tablet (250 mg total) by mouth daily., Disp: 30 tablet, Rfl: 6 ?  FLUoxetine (PROZAC) 20 MG capsule, TAKE 3 CAPSULES(60 MG) BY MOUTH DAILY, Disp: 270 capsule, Rfl: 3 ?  hydrOXYzine (ATARAX/VISTARIL) 25 MG tablet, Take 25 mg by mouth 3 (three) times daily as needed for anxiety., Disp: , Rfl:  ?  IBRANCE 100 MG tablet, Take 100 mg by mouth See admin instructions.  132m daily on day 1-21 of cycle. 7 days off.  Repeat., Disp: , Rfl:  ?  omeprazole (PRILOSEC) 20 MG capsule, Take 1 capsule (20 mg total) by mouth daily., Disp: 90 capsule, Rfl: 3 ?  ondansetron (ZOFRAN) 4 MG tablet, Take 1 tablet (4 mg total) by mouth every 4 (four) hours as needed., Disp: 20 tablet, Rfl: 0 ?  oxyCODONE (OXY IR/ROXICODONE) 5 MG immediate release tablet, Take 1 tablet (5 mg total) by mouth every 8 (eight) hours as needed for severe pain., Disp: 50 tablet, Rfl: 0 ?  polyethylene glycol (MIRALAX / GLYCOLAX) 17 g packet, Take 17 g by mouth daily as needed., Disp: 28 each, Rfl: 0 ?  SM SENNA LAXATIVE 8.6 MG tablet, Take 8.6 mg by mouth daily., Disp: , Rfl:  ?  vitamin B-12 (CYANOCOBALAMIN) 1000 MCG tablet, Take 1 tablet (1,000 mcg total) by mouth daily., Disp: 30 tablet, Rfl: 1 ? ?Allergies:  ?Allergies  ?Allergen Reactions  ? Amoxicillin-Pot Clavulanate Hives and Itching  ? Penicillins Hives, Itching and Other (See Comments)  ? Codeine Nausea And Vomiting  ?  Needs pre-meds ?  ? ? ?Past Medical History, Surgical history, Social history, and Family History were reviewed and updated. ? ?Review of Systems: ?Review of Systems  ?Constitutional:  Positive for appetite change.  ?HENT:  Negative.    ?  Eyes: Negative.   ?Respiratory: Negative.    ?Cardiovascular: Negative.   ?Gastrointestinal:  Positive for abdominal pain and nausea.  ?Endocrine: Negative.   ?Genitourinary: Negative.    ?Musculoskeletal:  Positive for arthralgias and back pain.  ?Skin: Negative.   ?Neurological:  Positive for dizziness.  ?Hematological: Negative.   ?Psychiatric/Behavioral: Negative.    ? ?Physical Exam: ? weight is 197 lb 12.8 oz (89.7 kg). Her oral temperature is 97.6 ?F (36.4 ?C). Her blood pressure is 120/66 and her pulse is 82. Her respiration is 17 and oxygen saturation is 99%.  ? ?Wt Readings from Last 3 Encounters:  ?07/12/21 197 lb 12.8 oz (89.7 kg)  ?06/25/21 196 lb 8 oz (89.1 kg)  ?06/17/21 197 lb 6.4 oz (89.5 kg)   ? ? ?Physical Exam ?Vitals reviewed.  ?HENT:  ?   Head: Normocephalic and atraumatic.  ?Eyes:  ?   Pupils: Pupils are equal, round, and reactive to light.  ?Cardiovascular:  ?   Rate and Rhythm: Normal rate and regular rhythm.  ?   Heart sounds: Normal heart sounds.  ?Pulmonary:  ?   Effort: Pulmonary effort is normal.  ?   Breath sounds: Normal breath sounds.  ?Abdominal:  ?   General: Bowel sounds are normal.  ?   Palpations: Abdomen is soft.  ?Musculoskeletal:     ?   General: No tenderness or deformity. Normal range of motion.  ?   Cervical back: Normal range of motion.  ?Lymphadenopathy:  ?   Cervical: No cervical adenopathy.  ?Skin: ?   General: Skin is warm and dry.  ?   Findings: No erythema or rash.  ?Neurological:  ?   Mental Status: She is alert and oriented to person, place, and time.  ?Psychiatric:     ?   Behavior: Behavior normal.     ?   Thought Content: Thought content normal.     ?   Judgment: Judgment normal.  ? ? ? ?Lab Results  ?Component Value Date  ? WBC 2.5 (L) 07/12/2021  ? HGB 11.6 (L) 07/12/2021  ? HCT 32.3 (L) 07/12/2021  ? MCV 105.9 (H) 07/12/2021  ? PLT 159 07/12/2021  ? ?  Chemistry   ?   ?Component Value Date/Time  ? NA 139 06/14/2021 1205  ? K 3.7 06/14/2021 1205  ? CL 104 06/14/2021 1205  ? CO2 28 06/14/2021 1205  ? BUN 11 06/14/2021 1205  ? CREATININE 0.88 06/14/2021 1205  ? CREATININE 0.88 10/22/2019 1246  ?    ?Component Value Date/Time  ? CALCIUM 8.9 06/14/2021 1205  ? ALKPHOS 46 06/14/2021 1205  ? AST 11 (L) 06/14/2021 1205  ? ALT 10 06/14/2021 1205  ? BILITOT 0.5 06/14/2021 1205  ?  ? ? ?Impression and Plan: ?Caitlin Greene is a very charming 56 year old postmenopausal female with metastatic breast cancer.  We actually had seen her many years ago with a carcinoma in situ. ? ?She is the wife of one of our former patients who passed away from ocular melanoma. ? ?She will get her Faslodex today.  Her white cell count platelet count is down a little bit just because of the Ibrance.   She stopped the [place] over the weekend.  This is her week off. ? ?We will set her up with scans before I see her back.  We will get a CT scan and a bone scan. ? ?When she comes back in April, she will have Xgeva in addition to the Faslodex. ? ?  Hopefully, we will see that everything is holding steady and that we will not have to make any changes in her protocol. ? ? ? ?[name], MD ?3/6/20233:01 PM ?

## 2021-07-14 LAB — CANCER ANTIGEN 27.29: CA 27.29: 51.3 U/mL — ABNORMAL HIGH (ref 0.0–38.6)

## 2021-08-05 ENCOUNTER — Ambulatory Visit (HOSPITAL_COMMUNITY)
Admission: RE | Admit: 2021-08-05 | Discharge: 2021-08-05 | Disposition: A | Discharge status: Home / Self Care | Payer: BC Managed Care – PPO | Source: Ambulatory Visit | Attending: Hematology & Oncology | Admitting: Hematology & Oncology

## 2021-08-05 ENCOUNTER — Ambulatory Visit (HOSPITAL_COMMUNITY): Payer: BC Managed Care – PPO

## 2021-08-05 ENCOUNTER — Other Ambulatory Visit (HOSPITAL_COMMUNITY): Payer: BC Managed Care – PPO

## 2021-08-05 DIAGNOSIS — C7951 Secondary malignant neoplasm of bone: Secondary | ICD-10-CM | POA: Diagnosis not present

## 2021-08-05 DIAGNOSIS — K8020 Calculus of gallbladder without cholecystitis without obstruction: Secondary | ICD-10-CM | POA: Diagnosis not present

## 2021-08-05 DIAGNOSIS — K5730 Diverticulosis of large intestine without perforation or abscess without bleeding: Secondary | ICD-10-CM | POA: Diagnosis not present

## 2021-08-05 DIAGNOSIS — K449 Diaphragmatic hernia without obstruction or gangrene: Secondary | ICD-10-CM | POA: Diagnosis not present

## 2021-08-05 DIAGNOSIS — C50919 Malignant neoplasm of unspecified site of unspecified female breast: Secondary | ICD-10-CM | POA: Insufficient documentation

## 2021-08-05 DIAGNOSIS — J984 Other disorders of lung: Secondary | ICD-10-CM | POA: Diagnosis not present

## 2021-08-05 DIAGNOSIS — I517 Cardiomegaly: Secondary | ICD-10-CM | POA: Diagnosis not present

## 2021-08-05 MED ORDER — IOHEXOL 300 MG/ML  SOLN
100.0000 mL | Freq: Once | INTRAMUSCULAR | Status: AC | PRN
  Administered 2021-08-05: 100 mL via INTRAVENOUS

## 2021-08-05 MED ORDER — SODIUM CHLORIDE (PF) 0.9 % IJ SOLN
INTRAMUSCULAR | Status: AC
  Filled 2021-08-05: qty 50

## 2021-08-06 ENCOUNTER — Encounter (HOSPITAL_COMMUNITY)
Admission: RE | Admit: 2021-08-06 | Discharge: 2021-08-06 | Disposition: A | Discharge status: Home / Self Care | Payer: BC Managed Care – PPO | Source: Ambulatory Visit | Attending: Hematology & Oncology | Admitting: Hematology & Oncology

## 2021-08-06 ENCOUNTER — Telehealth: Payer: Self-pay | Admitting: *Deleted

## 2021-08-06 DIAGNOSIS — C50919 Malignant neoplasm of unspecified site of unspecified female breast: Secondary | ICD-10-CM | POA: Diagnosis not present

## 2021-08-06 MED ORDER — TECHNETIUM TC 99M MEDRONATE IV KIT
20.0000 | PACK | Freq: Once | INTRAVENOUS | Status: AC | PRN
  Administered 2021-08-06: 21.3 via INTRAVENOUS

## 2021-08-06 NOTE — Telephone Encounter (Signed)
As noted below by Dr. Marin Olp, I informed her that the cancer is not growing! You are constipated. Make sure you are staying hydrated and take a laxative. She verbalized understanding. ?

## 2021-08-06 NOTE — Telephone Encounter (Signed)
-----   Message from [name], MD sent at 08/06/2021 11:15 AM EDT ----- ?Call - the caner is not growing!! You are constipated -- make sure that you take a laxative.  Caitlin Greene ?

## 2021-08-08 ENCOUNTER — Encounter: Payer: Self-pay | Admitting: Hematology & Oncology

## 2021-08-10 ENCOUNTER — Inpatient Hospital Stay: Payer: BC Managed Care – PPO | Admitting: Hematology & Oncology

## 2021-08-10 ENCOUNTER — Inpatient Hospital Stay: Payer: BC Managed Care – PPO

## 2021-08-10 ENCOUNTER — Encounter: Payer: Self-pay | Admitting: Hematology & Oncology

## 2021-08-10 ENCOUNTER — Other Ambulatory Visit: Payer: Self-pay

## 2021-08-10 ENCOUNTER — Inpatient Hospital Stay: Payer: BC Managed Care – PPO | Attending: Hematology & Oncology

## 2021-08-10 VITALS — BP 112/79 | HR 94 | Temp 98.3°F | Resp 16 | Wt 197.0 lb

## 2021-08-10 DIAGNOSIS — Z170 Estrogen receptor positive status [ER+]: Secondary | ICD-10-CM | POA: Insufficient documentation

## 2021-08-10 DIAGNOSIS — C7951 Secondary malignant neoplasm of bone: Secondary | ICD-10-CM

## 2021-08-10 DIAGNOSIS — C50919 Malignant neoplasm of unspecified site of unspecified female breast: Secondary | ICD-10-CM | POA: Insufficient documentation

## 2021-08-10 LAB — CBC WITH DIFFERENTIAL (CANCER CENTER ONLY)
Abs Immature Granulocytes: 0.01 10*3/uL (ref 0.00–0.07)
Basophils Absolute: 0 10*3/uL (ref 0.0–0.1)
Basophils Relative: 1 %
Eosinophils Absolute: 0 10*3/uL (ref 0.0–0.5)
Eosinophils Relative: 1 %
HCT: 32.9 % — ABNORMAL LOW (ref 36.0–46.0)
Hemoglobin: 11.4 g/dL — ABNORMAL LOW (ref 12.0–15.0)
Immature Granulocytes: 1 %
Lymphocytes Relative: 36 %
Lymphs Abs: 0.7 10*3/uL (ref 0.7–4.0)
MCH: 36.9 pg — ABNORMAL HIGH (ref 26.0–34.0)
MCHC: 34.7 g/dL (ref 30.0–36.0)
MCV: 106.5 fL — ABNORMAL HIGH (ref 80.0–100.0)
Monocytes Absolute: 0.2 10*3/uL (ref 0.1–1.0)
Monocytes Relative: 10 %
Neutro Abs: 1 10*3/uL — ABNORMAL LOW (ref 1.7–7.7)
Neutrophils Relative %: 51 %
Platelet Count: 141 10*3/uL — ABNORMAL LOW (ref 150–400)
RBC: 3.09 MIL/uL — ABNORMAL LOW (ref 3.87–5.11)
RDW: 14.1 % (ref 11.5–15.5)
Smear Review: NORMAL
WBC Count: 1.9 10*3/uL — ABNORMAL LOW (ref 4.0–10.5)
nRBC: 0 % (ref 0.0–0.2)

## 2021-08-10 LAB — LACTATE DEHYDROGENASE: LDH: 174 U/L (ref 98–192)

## 2021-08-10 LAB — CMP (CANCER CENTER ONLY)
ALT: 10 U/L (ref 0–44)
AST: 10 U/L — ABNORMAL LOW (ref 15–41)
Albumin: 4.5 g/dL (ref 3.5–5.0)
Alkaline Phosphatase: 52 U/L (ref 38–126)
Anion gap: 7 (ref 5–15)
BUN: 15 mg/dL (ref 6–20)
CO2: 29 mmol/L (ref 22–32)
Calcium: 10 mg/dL (ref 8.9–10.3)
Chloride: 103 mmol/L (ref 98–111)
Creatinine: 0.97 mg/dL (ref 0.44–1.00)
GFR, Estimated: >60 mL/min (ref >60)
Glucose, Bld: 94 mg/dL (ref 70–99)
Potassium: 4.2 mmol/L (ref 3.5–5.1)
Sodium: 139 mmol/L (ref 135–145)
Total Bilirubin: 0.5 mg/dL (ref 0.3–1.2)
Total Protein: 7 g/dL (ref 6.5–8.1)

## 2021-08-10 MED ORDER — FULVESTRANT 250 MG/5ML IM SOSY
500.0000 mg | PREFILLED_SYRINGE | INTRAMUSCULAR | Status: DC
  Administered 2021-08-10: 500 mg via INTRAMUSCULAR
  Filled 2021-08-10: qty 10

## 2021-08-10 MED ORDER — DENOSUMAB 120 MG/1.7ML ~~LOC~~ SOLN
120.0000 mg | Freq: Once | SUBCUTANEOUS | Status: AC
  Administered 2021-08-10: 120 mg via SUBCUTANEOUS
  Filled 2021-08-10: qty 1.7

## 2021-08-10 NOTE — Patient Instructions (Signed)
Fulvestrant injection ?What is this medication? ?FULVESTRANT (ful VES trant) blocks the effects of estrogen. It is used to treat breast cancer. ?This medicine may be used for other purposes; ask your health care provider or pharmacist if you have questions. ?COMMON BRAND NAME(S): FASLODEX ?What should I tell my care team before I take this medication? ?They need to know if you have any of these conditions: ?bleeding disorders ?liver disease ?low blood counts, like low white cell, platelet, or red cell counts ?an unusual or allergic reaction to fulvestrant, other medicines, foods, dyes, or preservatives ?pregnant or trying to get pregnant ?breast-feeding ?How should I use this medication? ?This medicine is for injection into a muscle. It is usually given by a health care professional in a hospital or clinic setting. ?Talk to your pediatrician regarding the use of this medicine in children. Special care may be needed. ?Overdosage: If you think you have taken too much of this medicine contact a poison control center or emergency room at once. ?NOTE: This medicine is only for you. Do not share this medicine with others. ?What if I miss a dose? ?It is important not to miss your dose. Call your doctor or health care professional if you are unable to keep an appointment. ?What may interact with this medication? ?medicines that treat or prevent blood clots like warfarin, enoxaparin, dalteparin, apixaban, dabigatran, and rivaroxaban ?This list may not describe all possible interactions. Give your health care provider a list of all the medicines, herbs, non-prescription drugs, or dietary supplements you use. Also tell them if you smoke, drink alcohol, or use illegal drugs. Some items may interact with your medicine. ?What should I watch for while using this medication? ?Your condition will be monitored carefully while you are receiving this medicine. You will need important blood work done while you are taking this  medicine. ?Do not become pregnant while taking this medicine or for at least 1 year after stopping it. Women of child-bearing potential will need to have a negative pregnancy test before starting this medicine. Women should inform their doctor if they wish to become pregnant or think they might be pregnant. There is a potential for serious side effects to an unborn child. Men should inform their doctors if they wish to father a child. This medicine may lower sperm counts. Talk to your health care professional or pharmacist for more information. Do not breast-feed an infant while taking this medicine or for 1 year after the last dose. ?What side effects may I notice from receiving this medication? ?Side effects that you should report to your doctor or health care professional as soon as possible: ?allergic reactions like skin rash, itching or hives, swelling of the face, lips, or tongue ?feeling faint or lightheaded, falls ?pain, tingling, numbness, or weakness in the legs ?signs and symptoms of infection like fever or chills; cough; flu-like symptoms; sore throat ?vaginal bleeding ?Side effects that usually do not require medical attention (report to your doctor or health care professional if they continue or are bothersome): ?aches, pains ?constipation ?diarrhea ?headache ?hot flashes ?nausea, vomiting ?pain at site where injected ?stomach pain ?This list may not describe all possible side effects. Call your doctor for medical advice about side effects. You may report side effects to FDA at 1-800-FDA-1088. ?Where should I keep my medication? ?This drug is given in a hospital or clinic and will not be stored at home. ?NOTE: This sheet is a summary. It may not cover all possible information. If you have  questions about this medicine, talk to your doctor, pharmacist, or health care provider. ?? 2022 Elsevier/Gold Standard (2017-08-08 00:00:00) ?Denosumab injection ?What is this medication? ?DENOSUMAB (den oh sue mab)  slows bone breakdown. Prolia is used to treat osteoporosis in women after menopause and in men, and in people who are taking corticosteroids for 6 months or more. Delton See is used to treat a high calcium level due to cancer and to prevent bone fractures and other bone problems caused by multiple myeloma or cancer bone metastases. Delton See is also used to treat giant cell tumor of the bone. ?This medicine may be used for other purposes; ask your health care provider or pharmacist if you have questions. ?COMMON BRAND NAME(S): Prolia, XGEVA ?What should I tell my care team before I take this medication? ?They need to know if you have any of these conditions: ?dental disease ?having surgery or tooth extraction ?infection ?kidney disease ?low levels of calcium or Vitamin D in the blood ?malnutrition ?on hemodialysis ?skin conditions or sensitivity ?thyroid or parathyroid disease ?an unusual reaction to denosumab, other medicines, foods, dyes, or preservatives ?pregnant or trying to get pregnant ?breast-feeding ?How should I use this medication? ?This medicine is for injection under the skin. It is given by a health care professional in a hospital or clinic setting. ?A special MedGuide will be given to you before each treatment. Be sure to read this information carefully each time. ?For Prolia, talk to your pediatrician regarding the use of this medicine in children. Special care may be needed. For Delton See, talk to your pediatrician regarding the use of this medicine in children. While this drug may be prescribed for children as young as 13 years for selected conditions, precautions do apply. ?Overdosage: If you think you have taken too much of this medicine contact a poison control center or emergency room at once. ?NOTE: This medicine is only for you. Do not share this medicine with others. ?What if I miss a dose? ?It is important not to miss your dose. Call your doctor or health care professional if you are unable to keep an  appointment. ?What may interact with this medication? ?Do not take this medicine with any of the following medications: ?other medicines containing denosumab ?This medicine may also interact with the following medications: ?medicines that lower your chance of fighting infection ?steroid medicines like prednisone or cortisone ?This list may not describe all possible interactions. Give your health care provider a list of all the medicines, herbs, non-prescription drugs, or dietary supplements you use. Also tell them if you smoke, drink alcohol, or use illegal drugs. Some items may interact with your medicine. ?What should I watch for while using this medication? ?Visit your doctor or health care professional for regular checks on your progress. Your doctor or health care professional may order blood tests and other tests to see how you are doing. ?Call your doctor or health care professional for advice if you get a fever, chills or sore throat, or other symptoms of a cold or flu. Do not treat yourself. This drug may decrease your body's ability to fight infection. Try to avoid being around people who are sick. ?You should make sure you get enough calcium and vitamin D while you are taking this medicine, unless your doctor tells you not to. Discuss the foods you eat and the vitamins you take with your health care professional. ?See your dentist regularly. Brush and floss your teeth as directed. Before you have any dental work done, tell  your dentist you are receiving this medicine. ?Do not become pregnant while taking this medicine or for 5 months after stopping it. Talk with your doctor or health care professional about your birth control options while taking this medicine. Women should inform their doctor if they wish to become pregnant or think they might be pregnant. There is a potential for serious side effects to an unborn child. Talk to your health care professional or pharmacist for more information. ?What side  effects may I notice from receiving this medication? ?Side effects that you should report to your doctor or health care professional as soon as possible: ?allergic reactions like skin rash, itching or hives, sw

## 2021-08-10 NOTE — Progress Notes (Signed)
?Hematology and Oncology Follow Up Visit ? ?Caitlin Greene ?[id number] ?March 02, 1966 56 y.o. ?08/10/2021 ? ? ?Principle Diagnosis:  ?Metastatic breast cancer-ER positive/HER-2 negative --bone metastasis only ? ?Current Therapy:   ?Faslodex 500 mg IM monthly --start on 04/2020 ?Ibrance 100 mg p.o. daily (21d on/7d off) - start on 04/2020 ?Xgeva 120 mg subcu every 3 months -next dose 07//2023 ?    ?Interim History:  Caitlin Greene is in for follow-up.  She is doing okay.  She really has no specific complaints.  We did go ahead and do scans on her.  The bone scan that she had done on 08/06/2021 did not show any evidence of progression with respect to her bony metastasis. ? ?The CT scan that she had done on 08/05/2021 also showed very stable disease. ? ?Her last CA 27.29 was holding steady at 51. ? ?She has had no problems with bowels or bladder.  She has had no bleeding.  She has had no nausea or vomiting.  She has had no headache.  There has been no leg swelling.  She has had no rashes.  There has been no cough or shortness of breath. ? ?Overall, I would say performance status is ECOG 1.   ? ? ?Medications:  ?Current Outpatient Medications:  ?  acetaminophen (TYLENOL) 650 MG CR tablet, Take 650 mg by mouth every 8 (eight) hours as needed for pain., Disp: , Rfl:  ?  CALCIUM PO, Take 1 tablet by mouth daily., Disp: , Rfl:  ?  COVID-19 mRNA bivalent vaccine, Pfizer, (PFIZER COVID-19 VAC BIVALENT) injection, Inject into the muscle., Disp: 0.3 mL, Rfl: 0 ?  doxycycline (VIBRA-TABS) 100 MG tablet, Take 1 tablet (100 mg total) by mouth 2 (two) times daily., Disp: 20 tablet, Rfl: 0 ?  famciclovir (FAMVIR) 250 MG tablet, Take 1 tablet (250 mg total) by mouth daily., Disp: 30 tablet, Rfl: 6 ?  FLUoxetine (PROZAC) 20 MG capsule, TAKE 3 CAPSULES(60 MG) BY MOUTH DAILY, Disp: 270 capsule, Rfl: 3 ?  hydrOXYzine (ATARAX/VISTARIL) 25 MG tablet, Take 25 mg by mouth 3 (three) times daily as needed for anxiety., Disp: , Rfl:  ?  IBRANCE 100 MG  tablet, Take 100 mg by mouth See admin instructions. 189m daily on day 1-21 of cycle. 7 days off.  Repeat., Disp: , Rfl:  ?  omeprazole (PRILOSEC) 20 MG capsule, Take 1 capsule (20 mg total) by mouth daily., Disp: 90 capsule, Rfl: 3 ?  ondansetron (ZOFRAN) 4 MG tablet, Take 1 tablet (4 mg total) by mouth every 4 (four) hours as needed., Disp: 20 tablet, Rfl: 0 ?  oxyCODONE (OXY IR/ROXICODONE) 5 MG immediate release tablet, Take 1 tablet (5 mg total) by mouth every 8 (eight) hours as needed for severe pain., Disp: 50 tablet, Rfl: 0 ?  polyethylene glycol (MIRALAX / GLYCOLAX) 17 g packet, Take 17 g by mouth daily as needed., Disp: 28 each, Rfl: 0 ?  SM SENNA LAXATIVE 8.6 MG tablet, Take 8.6 mg by mouth daily., Disp: , Rfl:  ?  vitamin B-12 (CYANOCOBALAMIN) 1000 MCG tablet, Take 1 tablet (1,000 mcg total) by mouth daily., Disp: 30 tablet, Rfl: 1 ?No current facility-administered medications for this visit. ? ?Facility-Administered Medications Ordered in Other Visits:  ?  denosumab (XGEVA) injection 120 mg, 120 mg, Subcutaneous, Once, Caitlin Greene, PRudell Cobb MD ?  fulvestrant (FASLODEX) injection 500 mg, 500 mg, Intramuscular, Q30 days, Caitlin Greene, PRudell Cobb MD ? ?Allergies:  ?Allergies  ?Allergen Reactions  ? Amoxicillin-Pot Clavulanate Hives and Itching  ?  Penicillins Hives, Itching and Other (See Comments)  ? Codeine Nausea And Vomiting  ?  Needs pre-meds ?  ? ? ?Past Medical History, Surgical history, Social history, and Family History were reviewed and updated. ? ?Review of Systems: ?Review of Systems  ?Constitutional:  Positive for appetite change.  ?HENT:  Negative.    ?Eyes: Negative.   ?Respiratory: Negative.    ?Cardiovascular: Negative.   ?Gastrointestinal:  Positive for abdominal pain and nausea.  ?Endocrine: Negative.   ?Genitourinary: Negative.    ?Musculoskeletal:  Positive for arthralgias and back pain.  ?Skin: Negative.   ?Neurological:  Positive for dizziness.  ?Hematological: Negative.    ?Psychiatric/Behavioral: Negative.    ? ?Physical Exam: ? weight is 197 lb (89.4 kg). Her oral temperature is 98.3 ?F (36.8 ?C). Her blood pressure is 112/79 and her pulse is 94. Her respiration is 16 and oxygen saturation is 98%.  ? ?Wt Readings from Last 3 Encounters:  ?08/10/21 197 lb (89.4 kg)  ?07/12/21 197 lb 12.8 oz (89.7 kg)  ?06/25/21 196 lb 8 oz (89.1 kg)  ? ? ?Physical Exam ?Vitals reviewed.  ?HENT:  ?   Head: Normocephalic and atraumatic.  ?Eyes:  ?   Pupils: Pupils are equal, round, and reactive to light.  ?Cardiovascular:  ?   Rate and Rhythm: Normal rate and regular rhythm.  ?   Heart sounds: Normal heart sounds.  ?Pulmonary:  ?   Effort: Pulmonary effort is normal.  ?   Breath sounds: Normal breath sounds.  ?Abdominal:  ?   General: Bowel sounds are normal.  ?   Palpations: Abdomen is soft.  ?Musculoskeletal:     ?   General: No tenderness or deformity. Normal range of motion.  ?   Cervical back: Normal range of motion.  ?Lymphadenopathy:  ?   Cervical: No cervical adenopathy.  ?Skin: ?   General: Skin is warm and dry.  ?   Findings: No erythema or rash.  ?Neurological:  ?   Mental Status: She is alert and oriented to person, place, and time.  ?Psychiatric:     ?   Behavior: Behavior normal.     ?   Thought Content: Thought content normal.     ?   Judgment: Judgment normal.  ? ? ? ?Lab Results  ?Component Value Date  ? WBC 1.9 (L) 08/10/2021  ? HGB 11.4 (L) 08/10/2021  ? HCT 32.9 (L) 08/10/2021  ? MCV 106.5 (H) 08/10/2021  ? PLT 141 (L) 08/10/2021  ? ?  Chemistry   ?   ?Component Value Date/Time  ? NA 139 08/10/2021 0951  ? K 4.2 08/10/2021 0951  ? CL 103 08/10/2021 0951  ? CO2 29 08/10/2021 0951  ? BUN 15 08/10/2021 0951  ? CREATININE 0.97 08/10/2021 0951  ? CREATININE 0.88 10/22/2019 1246  ?    ?Component Value Date/Time  ? CALCIUM 10.0 08/10/2021 0951  ? ALKPHOS 52 08/10/2021 0951  ? AST 10 (L) 08/10/2021 0951  ? ALT 10 08/10/2021 0951  ? BILITOT 0.5 08/10/2021 0951  ?  ? ? ?Impression and  Plan: ?Caitlin Greene is a very charming 56 year old postmenopausal female with metastatic breast cancer.  We actually had seen her many years ago with a carcinoma in situ. ? ?She is the wife of one of our former patients who passed away from ocular melanoma. ? ?She will get Faslodex and Xgeva today. ? ?I am happy that the CT scans show everything is holding pretty steady. ? ?We will  see what her CA 27.29 shows. ? ?We will plan to get her back to see [place] in another month. ? ? ?[name], MD ?4/4/202310:30 AM ?

## 2021-08-11 LAB — CANCER ANTIGEN 27.29: CA 27.29: 48.3 U/mL — ABNORMAL HIGH (ref 0.0–38.6)

## 2021-09-02 ENCOUNTER — Encounter (HOSPITAL_COMMUNITY): Payer: Self-pay | Admitting: *Deleted

## 2021-09-02 NOTE — Progress Notes (Signed)
Progress note from [place] completed for patient to continue her disability.  It was faxed back to [facility] and placed to be scanned into patients EMR.   ?

## 2021-09-07 ENCOUNTER — Inpatient Hospital Stay: Payer: BC Managed Care – PPO

## 2021-09-07 ENCOUNTER — Encounter: Payer: Self-pay | Admitting: Oncology

## 2021-09-07 ENCOUNTER — Inpatient Hospital Stay (HOSPITAL_BASED_OUTPATIENT_CLINIC_OR_DEPARTMENT_OTHER): Payer: BC Managed Care – PPO | Admitting: Hematology & Oncology

## 2021-09-07 ENCOUNTER — Inpatient Hospital Stay: Payer: BC Managed Care – PPO | Attending: Hematology & Oncology

## 2021-09-07 ENCOUNTER — Encounter: Payer: Self-pay | Admitting: Hematology & Oncology

## 2021-09-07 VITALS — BP 128/86 | HR 84 | Temp 97.5°F | Resp 18 | Ht 68.0 in | Wt 200.2 lb

## 2021-09-07 DIAGNOSIS — C50919 Malignant neoplasm of unspecified site of unspecified female breast: Secondary | ICD-10-CM

## 2021-09-07 DIAGNOSIS — Z170 Estrogen receptor positive status [ER+]: Secondary | ICD-10-CM | POA: Insufficient documentation

## 2021-09-07 DIAGNOSIS — Z85820 Personal history of malignant melanoma of skin: Secondary | ICD-10-CM | POA: Insufficient documentation

## 2021-09-07 DIAGNOSIS — C7951 Secondary malignant neoplasm of bone: Secondary | ICD-10-CM | POA: Diagnosis not present

## 2021-09-07 DIAGNOSIS — Z79899 Other long term (current) drug therapy: Secondary | ICD-10-CM | POA: Diagnosis not present

## 2021-09-07 DIAGNOSIS — Z79818 Long term (current) use of other agents affecting estrogen receptors and estrogen levels: Secondary | ICD-10-CM | POA: Diagnosis not present

## 2021-09-07 LAB — CBC WITH DIFFERENTIAL (CANCER CENTER ONLY)
Abs Immature Granulocytes: 0 10*3/uL (ref 0.00–0.07)
Basophils Absolute: 0 10*3/uL (ref 0.0–0.1)
Basophils Relative: 2 %
Eosinophils Absolute: 0 10*3/uL (ref 0.0–0.5)
Eosinophils Relative: 1 %
HCT: 31.1 % — ABNORMAL LOW (ref 36.0–46.0)
Hemoglobin: 10.9 g/dL — ABNORMAL LOW (ref 12.0–15.0)
Immature Granulocytes: 0 %
Lymphocytes Relative: 40 %
Lymphs Abs: 0.8 10*3/uL (ref 0.7–4.0)
MCH: 37.3 pg — ABNORMAL HIGH (ref 26.0–34.0)
MCHC: 35 g/dL (ref 30.0–36.0)
MCV: 106.5 fL — ABNORMAL HIGH (ref 80.0–100.0)
Monocytes Absolute: 0.2 10*3/uL (ref 0.1–1.0)
Monocytes Relative: 8 %
Neutro Abs: 1 10*3/uL — ABNORMAL LOW (ref 1.7–7.7)
Neutrophils Relative %: 49 %
Platelet Count: 138 10*3/uL — ABNORMAL LOW (ref 150–400)
RBC: 2.92 MIL/uL — ABNORMAL LOW (ref 3.87–5.11)
RDW: 14.8 % (ref 11.5–15.5)
Smear Review: NORMAL
WBC Count: 1.9 10*3/uL — ABNORMAL LOW (ref 4.0–10.5)
nRBC: 0 % (ref 0.0–0.2)

## 2021-09-07 LAB — CMP (CANCER CENTER ONLY)
ALT: 9 U/L (ref 0–44)
AST: 11 U/L — ABNORMAL LOW (ref 15–41)
Albumin: 4.3 g/dL (ref 3.5–5.0)
Alkaline Phosphatase: 47 U/L (ref 38–126)
Anion gap: 6 (ref 5–15)
BUN: 14 mg/dL (ref 6–20)
CO2: 26 mmol/L (ref 22–32)
Calcium: 9.1 mg/dL (ref 8.9–10.3)
Chloride: 108 mmol/L (ref 98–111)
Creatinine: 1.09 mg/dL — ABNORMAL HIGH (ref 0.44–1.00)
GFR, Estimated: 59 mL/min — ABNORMAL LOW (ref >60)
Glucose, Bld: 96 mg/dL (ref 70–99)
Potassium: 4.2 mmol/L (ref 3.5–5.1)
Sodium: 140 mmol/L (ref 135–145)
Total Bilirubin: 0.5 mg/dL (ref 0.3–1.2)
Total Protein: 6.9 g/dL (ref 6.5–8.1)

## 2021-09-07 MED ORDER — FULVESTRANT 250 MG/5ML IM SOSY
500.0000 mg | PREFILLED_SYRINGE | INTRAMUSCULAR | Status: DC
  Administered 2021-09-07: 500 mg via INTRAMUSCULAR
  Filled 2021-09-07: qty 10

## 2021-09-07 NOTE — Patient Instructions (Signed)
Fulvestrant injection What is this medication? FULVESTRANT (ful VES trant) blocks the effects of estrogen. It is used to treat breast cancer. This medicine may be used for other purposes; ask your health care provider or pharmacist if you have questions. COMMON BRAND NAME(S): FASLODEX What should I tell my care team before I take this medication? They need to know if you have any of these conditions: bleeding disorders liver disease low blood counts, like low white cell, platelet, or red cell counts an unusual or allergic reaction to fulvestrant, other medicines, foods, dyes, or preservatives pregnant or trying to get pregnant breast-feeding How should I use this medication? This medicine is for injection into a muscle. It is usually given by a health care professional in a hospital or clinic setting. Talk to your pediatrician regarding the use of this medicine in children. Special care may be needed. Overdosage: If you think you have taken too much of this medicine contact a poison control center or emergency room at once. NOTE: This medicine is only for you. Do not share this medicine with others. What if I miss a dose? It is important not to miss your dose. Call your doctor or health care professional if you are unable to keep an appointment. What may interact with this medication? medicines that treat or prevent blood clots like warfarin, enoxaparin, dalteparin, apixaban, dabigatran, and rivaroxaban This list may not describe all possible interactions. Give your health care provider a list of all the medicines, herbs, non-prescription drugs, or dietary supplements you use. Also tell them if you smoke, drink alcohol, or use illegal drugs. Some items may interact with your medicine. What should I watch for while using this medication? Your condition will be monitored carefully while you are receiving this medicine. You will need important blood work done while you are taking this  medicine. Do not become pregnant while taking this medicine or for at least 1 year after stopping it. Women of child-bearing potential will need to have a negative pregnancy test before starting this medicine. Women should inform their doctor if they wish to become pregnant or think they might be pregnant. There is a potential for serious side effects to an unborn child. Men should inform their doctors if they wish to father a child. This medicine may lower sperm counts. Talk to your health care professional or pharmacist for more information. Do not breast-feed an infant while taking this medicine or for 1 year after the last dose. What side effects may I notice from receiving this medication? Side effects that you should report to your doctor or health care professional as soon as possible: allergic reactions like skin rash, itching or hives, swelling of the face, lips, or tongue feeling faint or lightheaded, falls pain, tingling, numbness, or weakness in the legs signs and symptoms of infection like fever or chills; cough; flu-like symptoms; sore throat vaginal bleeding Side effects that usually do not require medical attention (report to your doctor or health care professional if they continue or are bothersome): aches, pains constipation diarrhea headache hot flashes nausea, vomiting pain at site where injected stomach pain This list may not describe all possible side effects. Call your doctor for medical advice about side effects. You may report side effects to FDA at 1-800-FDA-1088. Where should I keep my medication? This drug is given in a hospital or clinic and will not be stored at home. NOTE: This sheet is a summary. It may not cover all possible information. If you have   questions about this medicine, talk to your doctor, pharmacist, or health care provider.  2023 Elsevier/Gold Standard (2017-08-08 00:00:00)  

## 2021-09-07 NOTE — Progress Notes (Signed)
?Hematology and Oncology Follow Up Visit ? ?Caitlin Greene ?[id number] ?06-Dec-1965 56 y.o. ?09/07/2021 ? ? ?Principle Diagnosis:  ?Metastatic breast cancer-ER positive/HER-2 negative --bone metastasis only ? ?Current Therapy:   ?Faslodex 500 mg IM monthly --start on 04/2020 ?Ibrance 100 mg p.o. daily (21d on/7d off) - start on 04/2020 ?Xgeva 120 mg subcu every 3 months -next dose 07//2023 ?    ?Interim History:  Caitlin Greene is in for follow-up.  She seems to doing quite nicely.  She really has had no specific complaints since we last saw her.  There has been no issues with cough or shortness of breath.  She has had no problems with nausea or vomiting. ? ?She had a very nice Easter with her family.  So happy that she could enjoy this. ? ?She has had no change in bowel or bladder habits.  She has had no leg swelling.  She has had no rashes. ? ?She has gained a little bit of weight.  Her appetite has been quite vigorous. ? ?Her last CA 27.29 back in early April was 31. ? ?Overall, I would say her performance status is probably ECOG 1. ? ?Medications:  ?Current Outpatient Medications:  ?  CALCIUM PO, Take 1 tablet by mouth daily., Disp: , Rfl:  ?  FLUoxetine (PROZAC) 20 MG capsule, TAKE 3 CAPSULES(60 MG) BY MOUTH DAILY, Disp: 270 capsule, Rfl: 3 ?  IBRANCE 100 MG tablet, Take 100 mg by mouth See admin instructions. 115m daily on day 1-21 of cycle. 7 days off.  Repeat., Disp: , Rfl:  ?  omeprazole (PRILOSEC) 20 MG capsule, Take 1 capsule (20 mg total) by mouth daily., Disp: 90 capsule, Rfl: 3 ?  vitamin B-12 (CYANOCOBALAMIN) 1000 MCG tablet, Take 1 tablet (1,000 mcg total) by mouth daily., Disp: 30 tablet, Rfl: 1 ?  acetaminophen (TYLENOL) 650 MG CR tablet, Take 650 mg by mouth every 8 (eight) hours as needed for pain. (Patient not taking: Reported on 09/07/2021), Disp: , Rfl:  ?  COVID-19 mRNA bivalent vaccine, Pfizer, (PFIZER COVID-19 VAC BIVALENT) injection, Inject into the muscle., Disp: 0.3 mL, Rfl: 0 ?  doxycycline  (VIBRA-TABS) 100 MG tablet, Take 1 tablet (100 mg total) by mouth 2 (two) times daily., Disp: 20 tablet, Rfl: 0 ?  famciclovir (FAMVIR) 250 MG tablet, Take 1 tablet (250 mg total) by mouth daily. (Patient not taking: Reported on 09/07/2021), Disp: 30 tablet, Rfl: 6 ?  hydrOXYzine (ATARAX/VISTARIL) 25 MG tablet, Take 25 mg by mouth 3 (three) times daily as needed for anxiety. (Patient not taking: Reported on 09/07/2021), Disp: , Rfl:  ?  ondansetron (ZOFRAN) 4 MG tablet, Take 1 tablet (4 mg total) by mouth every 4 (four) hours as needed. (Patient not taking: Reported on 09/07/2021), Disp: 20 tablet, Rfl: 0 ?  oxyCODONE (OXY IR/ROXICODONE) 5 MG immediate release tablet, Take 1 tablet (5 mg total) by mouth every 8 (eight) hours as needed for severe pain. (Patient not taking: Reported on 09/07/2021), Disp: 50 tablet, Rfl: 0 ?  polyethylene glycol (MIRALAX / GLYCOLAX) 17 g packet, Take 17 g by mouth daily as needed. (Patient not taking: Reported on 09/07/2021), Disp: 28 each, Rfl: 0 ?  SM SENNA LAXATIVE 8.6 MG tablet, Take 8.6 mg by mouth daily. (Patient not taking: Reported on 09/07/2021), Disp: , Rfl:  ?No current facility-administered medications for this visit. ? ?Facility-Administered Medications Ordered in Other Visits:  ?  fulvestrant (FASLODEX) injection 500 mg, 500 mg, Intramuscular, Q30 days, Orlyn Odonoghue, PRudell Cobb MD, 500  mg at 09/07/21 1141 ? ?Allergies:  ?Allergies  ?Allergen Reactions  ? Amoxicillin-Pot Clavulanate Hives and Itching  ? Penicillins Hives, Itching and Other (See Comments)  ? Codeine Nausea And Vomiting  ?  Needs pre-meds ?  ? ? ?Past Medical History, Surgical history, Social history, and Family History were reviewed and updated. ? ?Review of Systems: ?Review of Systems  ?Constitutional:  Positive for appetite change.  ?HENT:  Negative.    ?Eyes: Negative.   ?Respiratory: Negative.    ?Cardiovascular: Negative.   ?Gastrointestinal:  Positive for abdominal pain and nausea.  ?Endocrine: Negative.    ?Genitourinary: Negative.    ?Musculoskeletal:  Positive for arthralgias and back pain.  ?Skin: Negative.   ?Neurological:  Positive for dizziness.  ?Hematological: Negative.   ?Psychiatric/Behavioral: Negative.    ? ?Physical Exam: ? height is 5' 8"  (1.727 m) and weight is 200 lb 4 oz (90.8 kg). Her oral temperature is 97.5 ?F (36.4 ?C) (abnormal). Her blood pressure is 128/86 and her pulse is 84. Her respiration is 18 and oxygen saturation is 98%.  ? ?Wt Readings from Last 3 Encounters:  ?09/07/21 200 lb 4 oz (90.8 kg)  ?08/10/21 197 lb (89.4 kg)  ?07/12/21 197 lb 12.8 oz (89.7 kg)  ? ? ?Physical Exam ?Vitals reviewed.  ?HENT:  ?   Head: Normocephalic and atraumatic.  ?Eyes:  ?   Pupils: Pupils are equal, round, and reactive to light.  ?Cardiovascular:  ?   Rate and Rhythm: Normal rate and regular rhythm.  ?   Heart sounds: Normal heart sounds.  ?Pulmonary:  ?   Effort: Pulmonary effort is normal.  ?   Breath sounds: Normal breath sounds.  ?Abdominal:  ?   General: Bowel sounds are normal.  ?   Palpations: Abdomen is soft.  ?Musculoskeletal:     ?   General: No tenderness or deformity. Normal range of motion.  ?   Cervical back: Normal range of motion.  ?Lymphadenopathy:  ?   Cervical: No cervical adenopathy.  ?Skin: ?   General: Skin is warm and dry.  ?   Findings: No erythema or rash.  ?Neurological:  ?   Mental Status: She is alert and oriented to person, place, and time.  ?Psychiatric:     ?   Behavior: Behavior normal.     ?   Thought Content: Thought content normal.     ?   Judgment: Judgment normal.  ? ? ? ?Lab Results  ?Component Value Date  ? WBC 1.9 (L) 09/07/2021  ? HGB 10.9 (L) 09/07/2021  ? HCT 31.1 (L) 09/07/2021  ? MCV 106.5 (H) 09/07/2021  ? PLT 138 (L) 09/07/2021  ? ?  Chemistry   ?   ?Component Value Date/Time  ? NA 140 09/07/2021 1017  ? K 4.2 09/07/2021 1017  ? CL 108 09/07/2021 1017  ? CO2 26 09/07/2021 1017  ? BUN 14 09/07/2021 1017  ? CREATININE 1.09 (H) 09/07/2021 1017  ? CREATININE 0.88  10/22/2019 1246  ?    ?Component Value Date/Time  ? CALCIUM 9.1 09/07/2021 1017  ? ALKPHOS 47 09/07/2021 1017  ? AST 11 (L) 09/07/2021 1017  ? ALT 9 09/07/2021 1017  ? BILITOT 0.5 09/07/2021 1017  ?  ? ? ?Impression and Plan: ?Caitlin Greene is a very charming 56 year old postmenopausal female with metastatic breast cancer.  We actually had seen her many years ago with a carcinoma in situ. ? ?She is the wife of one of our former patients  who passed away from ocular melanoma. ? ?I am happy that she is doing nicely.  I am happy that her quality of life is doing well.  I am not too worried about the weight gain.  I told her I would be more worried about weight loss. ? ?We will see what her CA 27.29 is. ? ?I do not think we have to do notice that the scans probably until early July.  I think this would be reasonable. ? ?She will get her Faslodex today. ? ?I will plan to see her back in 1 month. ? ? ?[name], MD ?5/2/202312:35 PM ?

## 2021-09-08 LAB — CANCER ANTIGEN 27.29: CA 27.29: 36 U/mL (ref 0.0–38.6)

## 2021-09-12 ENCOUNTER — Emergency Department (HOSPITAL_COMMUNITY): Payer: BC Managed Care – PPO

## 2021-09-12 ENCOUNTER — Encounter (HOSPITAL_COMMUNITY): Payer: Self-pay

## 2021-09-12 ENCOUNTER — Inpatient Hospital Stay (HOSPITAL_COMMUNITY)
Admission: EM | Admit: 2021-09-12 | Discharge: 2021-09-14 | DRG: 378 | Disposition: A | Discharge status: Home / Self Care | Payer: BC Managed Care – PPO | Attending: Internal Medicine | Admitting: Internal Medicine

## 2021-09-12 DIAGNOSIS — Z79899 Other long term (current) drug therapy: Secondary | ICD-10-CM | POA: Diagnosis not present

## 2021-09-12 DIAGNOSIS — D62 Acute posthemorrhagic anemia: Secondary | ICD-10-CM | POA: Diagnosis not present

## 2021-09-12 DIAGNOSIS — K921 Melena: Principal | ICD-10-CM | POA: Diagnosis present

## 2021-09-12 DIAGNOSIS — K2970 Gastritis, unspecified, without bleeding: Secondary | ICD-10-CM | POA: Diagnosis not present

## 2021-09-12 DIAGNOSIS — K3189 Other diseases of stomach and duodenum: Secondary | ICD-10-CM | POA: Diagnosis not present

## 2021-09-12 DIAGNOSIS — D509 Iron deficiency anemia, unspecified: Secondary | ICD-10-CM | POA: Diagnosis present

## 2021-09-12 DIAGNOSIS — D61818 Other pancytopenia: Secondary | ICD-10-CM | POA: Diagnosis not present

## 2021-09-12 DIAGNOSIS — N289 Disorder of kidney and ureter, unspecified: Secondary | ICD-10-CM | POA: Diagnosis not present

## 2021-09-12 DIAGNOSIS — K922 Gastrointestinal hemorrhage, unspecified: Secondary | ICD-10-CM | POA: Diagnosis not present

## 2021-09-12 DIAGNOSIS — Z9011 Acquired absence of right breast and nipple: Secondary | ICD-10-CM

## 2021-09-12 DIAGNOSIS — K2971 Gastritis, unspecified, with bleeding: Secondary | ICD-10-CM

## 2021-09-12 DIAGNOSIS — Z885 Allergy status to narcotic agent status: Secondary | ICD-10-CM | POA: Diagnosis not present

## 2021-09-12 DIAGNOSIS — F32A Depression, unspecified: Secondary | ICD-10-CM | POA: Diagnosis present

## 2021-09-12 DIAGNOSIS — Z825 Family history of asthma and other chronic lower respiratory diseases: Secondary | ICD-10-CM

## 2021-09-12 DIAGNOSIS — E785 Hyperlipidemia, unspecified: Secondary | ICD-10-CM | POA: Diagnosis present

## 2021-09-12 DIAGNOSIS — K2901 Acute gastritis with bleeding: Secondary | ICD-10-CM | POA: Diagnosis not present

## 2021-09-12 DIAGNOSIS — K254 Chronic or unspecified gastric ulcer with hemorrhage: Secondary | ICD-10-CM

## 2021-09-12 DIAGNOSIS — R42 Dizziness and giddiness: Secondary | ICD-10-CM | POA: Diagnosis not present

## 2021-09-12 DIAGNOSIS — R1111 Vomiting without nausea: Secondary | ICD-10-CM | POA: Diagnosis not present

## 2021-09-12 DIAGNOSIS — K449 Diaphragmatic hernia without obstruction or gangrene: Secondary | ICD-10-CM | POA: Diagnosis present

## 2021-09-12 DIAGNOSIS — Z87442 Personal history of urinary calculi: Secondary | ICD-10-CM

## 2021-09-12 DIAGNOSIS — C50919 Malignant neoplasm of unspecified site of unspecified female breast: Secondary | ICD-10-CM | POA: Diagnosis present

## 2021-09-12 DIAGNOSIS — Z853 Personal history of malignant neoplasm of breast: Secondary | ICD-10-CM

## 2021-09-12 DIAGNOSIS — R58 Hemorrhage, not elsewhere classified: Secondary | ICD-10-CM | POA: Diagnosis not present

## 2021-09-12 DIAGNOSIS — K219 Gastro-esophageal reflux disease without esophagitis: Secondary | ICD-10-CM | POA: Diagnosis present

## 2021-09-12 DIAGNOSIS — J309 Allergic rhinitis, unspecified: Secondary | ICD-10-CM | POA: Diagnosis present

## 2021-09-12 DIAGNOSIS — F411 Generalized anxiety disorder: Secondary | ICD-10-CM | POA: Diagnosis present

## 2021-09-12 DIAGNOSIS — Z881 Allergy status to other antibiotic agents status: Secondary | ICD-10-CM | POA: Diagnosis not present

## 2021-09-12 DIAGNOSIS — Z880 Allergy status to penicillin: Secondary | ICD-10-CM

## 2021-09-12 DIAGNOSIS — R000 Tachycardia, unspecified: Secondary | ICD-10-CM | POA: Diagnosis not present

## 2021-09-12 DIAGNOSIS — C7951 Secondary malignant neoplasm of bone: Secondary | ICD-10-CM | POA: Diagnosis not present

## 2021-09-12 DIAGNOSIS — D519 Vitamin B12 deficiency anemia, unspecified: Secondary | ICD-10-CM | POA: Diagnosis present

## 2021-09-12 DIAGNOSIS — J189 Pneumonia, unspecified organism: Secondary | ICD-10-CM | POA: Diagnosis not present

## 2021-09-12 DIAGNOSIS — R0600 Dyspnea, unspecified: Secondary | ICD-10-CM | POA: Diagnosis not present

## 2021-09-12 DIAGNOSIS — K920 Hematemesis: Secondary | ICD-10-CM | POA: Diagnosis not present

## 2021-09-12 LAB — COMPREHENSIVE METABOLIC PANEL
ALT: 12 U/L (ref 0–44)
AST: 13 U/L — ABNORMAL LOW (ref 15–41)
Albumin: 3.7 g/dL (ref 3.5–5.0)
Alkaline Phosphatase: 43 U/L (ref 38–126)
Anion gap: 5 (ref 5–15)
BUN: 25 mg/dL — ABNORMAL HIGH (ref 6–20)
CO2: 25 mmol/L (ref 22–32)
Calcium: 8.4 mg/dL — ABNORMAL LOW (ref 8.9–10.3)
Chloride: 110 mmol/L (ref 98–111)
Creatinine, Ser: 0.89 mg/dL (ref 0.44–1.00)
GFR, Estimated: >60 mL/min (ref >60)
Glucose, Bld: 102 mg/dL — ABNORMAL HIGH (ref 70–99)
Potassium: 3.5 mmol/L (ref 3.5–5.1)
Sodium: 140 mmol/L (ref 135–145)
Total Bilirubin: 0.6 mg/dL (ref 0.3–1.2)
Total Protein: 6.4 g/dL — ABNORMAL LOW (ref 6.5–8.1)

## 2021-09-12 LAB — CBC WITH DIFFERENTIAL/PLATELET
Abs Immature Granulocytes: 0.01 K/uL (ref 0.00–0.07)
Basophils Absolute: 0 K/uL (ref 0.0–0.1)
Basophils Relative: 1 %
Eosinophils Absolute: 0 K/uL (ref 0.0–0.5)
Eosinophils Relative: 1 %
HCT: 26 % — ABNORMAL LOW (ref 36.0–46.0)
Hemoglobin: 9.1 g/dL — ABNORMAL LOW (ref 12.0–15.0)
Immature Granulocytes: 0 %
Lymphocytes Relative: 36 %
Lymphs Abs: 1.1 K/uL (ref 0.7–4.0)
MCH: 37.8 pg — ABNORMAL HIGH (ref 26.0–34.0)
MCHC: 35 g/dL (ref 30.0–36.0)
MCV: 107.9 fL — ABNORMAL HIGH (ref 80.0–100.0)
Monocytes Absolute: 0.4 K/uL (ref 0.1–1.0)
Monocytes Relative: 14 %
Neutro Abs: 1.5 K/uL — ABNORMAL LOW (ref 1.7–7.7)
Neutrophils Relative %: 48 %
Platelets: 143 K/uL — ABNORMAL LOW (ref 150–400)
RBC: 2.41 MIL/uL — ABNORMAL LOW (ref 3.87–5.11)
RDW: 15.5 % (ref 11.5–15.5)
WBC: 3 K/uL — ABNORMAL LOW (ref 4.0–10.5)
nRBC: 0 % (ref 0.0–0.2)

## 2021-09-12 LAB — LIPASE, BLOOD: Lipase: 30 U/L (ref 11–51)

## 2021-09-12 LAB — PROTIME-INR
INR: 1 (ref 0.8–1.2)
Prothrombin Time: 13.4 seconds (ref 11.4–15.2)

## 2021-09-12 LAB — POC OCCULT BLOOD, ED: Fecal Occult Bld: POSITIVE — AB

## 2021-09-12 LAB — TYPE AND SCREEN
ABO/RH(D): O POS
Antibody Screen: NEGATIVE

## 2021-09-12 MED ORDER — PANTOPRAZOLE INFUSION (NEW) - SIMPLE MED
8.0000 mg/h | INTRAVENOUS | Status: DC
  Administered 2021-09-12 – 2021-09-13 (×2): 8 mg/h via INTRAVENOUS
  Filled 2021-09-12 (×2): qty 80
  Filled 2021-09-12: qty 100

## 2021-09-12 MED ORDER — PANTOPRAZOLE 80MG IVPB - SIMPLE MED
80.0000 mg | Freq: Once | INTRAVENOUS | Status: AC
  Administered 2021-09-12: 80 mg via INTRAVENOUS
  Filled 2021-09-12: qty 80

## 2021-09-12 MED ORDER — SODIUM CHLORIDE 0.9 % IV BOLUS
1000.0000 mL | Freq: Once | INTRAVENOUS | Status: AC
  Administered 2021-09-12: 1000 mL via INTRAVENOUS

## 2021-09-12 NOTE — ED Provider Notes (Signed)
?[facility] DEPT ?Provider Note ? ? ?CSN: [id number] ?Arrival date & time: 09/12/21  2156 ? ?  ? ?History ? ?Chief Complaint  ?Patient presents with  ? GI Bleeding  ? ? ?Caitlin Greene is a 56 y.o. female with a history of metastatic breast cancer, anemia, and hyperlipidemia who presents to the ED via EMS for evaluation of GI bleeding that began this evening. Patient reports she has felt weak for the past few days to 1 week, today started to have some mild aching intermittent epigastric pain, and tonight had a dark bowel movement with subsequent onset of nausea. Reports 3 episodes of coffee ground appearing emesis. Given these findings she called 911. She has felt lightheaded at times and fatigued/short of breath with activity. No other alleviating/aggravating factors to her sxs. She denies chest pain, syncope, BRBPR, or NSAID/anticoagulant/EtOH use.  ?HPI ? ?  ? ?Home Medications ?Prior to Admission medications   ?Medication Sig Start Date End Date Taking? Authorizing Provider  ?acetaminophen (TYLENOL) 650 MG CR tablet Take 650 mg by mouth every 8 (eight) hours as needed for pain. ?Patient not taking: Reported on 09/07/2021    [provider]  ?CALCIUM PO Take 1 tablet by mouth daily.    [provider]  ?COVID-19 mRNA bivalent vaccine, Pfizer, (PFIZER COVID-19 VAC BIVALENT) injection Inject into the muscle. 03/15/21   Carlyle Basques, MD  ?doxycycline (VIBRA-TABS) 100 MG tablet Take 1 tablet (100 mg total) by mouth 2 (two) times daily. 06/25/21   Biagio Borg, MD  ?famciclovir (FAMVIR) 250 MG tablet Take 1 tablet (250 mg total) by mouth daily. ?Patient not taking: Reported on 09/07/2021 04/07/20   [name], MD  ?FLUoxetine (PROZAC) 20 MG capsule TAKE 3 CAPSULES(60 MG) BY MOUTH DAILY 06/25/21   Biagio Borg, MD  ?hydrOXYzine (ATARAX/VISTARIL) 25 MG tablet Take 25 mg by mouth 3 (three) times daily as needed for anxiety. ?Patient not taking: Reported on 09/07/2021  12/31/19   [provider]  ?IBRANCE 100 MG tablet Take 100 mg by mouth See admin instructions. '100mg'$  daily on day 1-21 of cycle. 7 days off.  Repeat. 03/13/20   [provider]  ?omeprazole (PRILOSEC) 20 MG capsule Take 1 capsule (20 mg total) by mouth daily. 06/25/21   Biagio Borg, MD  ?ondansetron (ZOFRAN) 4 MG tablet Take 1 tablet (4 mg total) by mouth every 4 (four) hours as needed. ?Patient not taking: Reported on 09/07/2021 03/24/20   Shelly Coss, MD  ?oxyCODONE (OXY IR/ROXICODONE) 5 MG immediate release tablet Take 1 tablet (5 mg total) by mouth every 8 (eight) hours as needed for severe pain. ?Patient not taking: Reported on 09/07/2021 11/06/20   [name], MD  ?polyethylene glycol (MIRALAX / GLYCOLAX) 17 g packet Take 17 g by mouth daily as needed. ?Patient not taking: Reported on 09/07/2021 03/24/20   Shelly Coss, MD  ?SM SENNA LAXATIVE 8.6 MG tablet Take 8.6 mg by mouth daily. ?Patient not taking: Reported on 09/07/2021 10/30/19   [provider]  ?vitamin B-12 (CYANOCOBALAMIN) 1000 MCG tablet Take 1 tablet (1,000 mcg total) by mouth daily. 03/24/20   Shelly Coss, MD  ?   ? ?Allergies    ?Amoxicillin-pot clavulanate, Penicillins, and Codeine   ? ?Review of Systems   ?Review of Systems  ?Constitutional:  Positive for fatigue. Negative for fever.  ?Respiratory:  Positive for shortness of breath.   ?Cardiovascular:  Negative for chest pain and leg swelling.  ?Gastrointestinal:  Positive for blood in stool (dark), nausea and vomiting. Negative for abdominal pain and constipation.  ?Genitourinary:  Negative for dysuria.  ?Neurological:  Positive for light-headedness. Negative for syncope.  ?All other systems reviewed and are negative. ? ?Physical Exam ?Updated Vital Signs ?BP (!) 138/94 (BP Location: Left Arm)   Pulse 96   Temp 98.1 ?F (36.7 ?C) (Oral)   Resp 20   Ht '5\' 8"'$  (1.727 m)   Wt 90.7 kg   LMP 06/09/2017 (Exact Date)   SpO2 98%   BMI 30.41 kg/m?  ?Physical  Exam ?Vitals and nursing note reviewed.  ?Constitutional:   ?   General: She is not in acute distress. ?   Appearance: She is well-developed. She is not toxic-appearing.  ?HENT:  ?   Head: Normocephalic and atraumatic.  ?Eyes:  ?   General:     ?   Right eye: No discharge.     ?   Left eye: No discharge.  ?   Conjunctiva/sclera: Conjunctivae normal.  ?Cardiovascular:  ?   Rate and Rhythm: Normal rate and regular rhythm.  ?Pulmonary:  ?   Effort: No respiratory distress.  ?   Breath sounds: Normal breath sounds. No wheezing or rales.  ?Abdominal:  ?   General: There is no distension.  ?   Palpations: Abdomen is soft.  ?   Tenderness: There is abdominal tenderness (mild epigastric). There is no guarding or rebound.  ?Genitourinary: ?   Comments: RN present as chaperone.  ?Melena present.  ?Fecal occult positive. No bright red blood noted.  ?Musculoskeletal:  ?   Cervical back: Neck supple.  ?Skin: ?   General: Skin is warm and dry.  ?Neurological:  ?   Mental Status: She is alert.  ?   Comments: Clear speech.   ?Psychiatric:     ?   Behavior: Behavior normal.  ? ? ?ED Results / Procedures / Treatments   ?Labs ?(all labs ordered are listed, but only abnormal results are displayed) ?Labs Reviewed - No data to display ? ?EKG ?None ? ?Radiology ?DG Chest 2 View ? ?Result Date: 09/12/2021 ?CLINICAL DATA:  Dyspnea EXAM: CHEST - 2 VIEW COMPARISON:  None Available. FINDINGS: The heart size and mediastinal contours are within normal limits. Both lungs are clear. Multilevel vertebral augmentation. Hiatal hernia. IMPRESSION: No active cardiopulmonary disease. Electronically Signed   By: Ulyses Jarred M.D.   On: 09/12/2021 22:59   ? ?Procedures ?Procedures  ? ? ?Medications Ordered in ED ?Medications - No data to display ? ?ED Course/ Medical Decision Making/ A&P ?  ?                        ?Medical Decision Making ?Amount and/or Complexity of Data Reviewed ?Labs: ordered. ?Radiology: ordered. ? ?Risk ?Decision regarding  hospitalization. ? ? ?Patient presents to the ED with complaints of Gi bleed, this involves an extensive number of treatment options, and is a complaint that carries with it a high risk of complications and morbidity. Nontoxic, vitals mildly elevated BP.  ? ?Additional history obtained:  ?Chart & nursing note reviewed.  ?External records reviewed ?Upper endoscopy 03/2020:  ?- Midly tortuous distal esophagus. ?- Normal esophagus. Biopsied for reflux and eosinophilic esophagitis. ?- Normal stomach. Biopsied for H pylori. ?- Medium sized hiatal hernia. ?- The examination was otherwise normal. ? ?EKG: Sinus rhythm, Qtc < 500 ? ?Lab Tests:  ?I viewed & interpreted labs including:  ?CBC: worsening anemia. Leukopenia and  thrombocytopenia similar to prior visit ranges.  ?CMP: elevated BUN, mildly low protein.  ?Lipase: WNL ?Fecal occult testing: positive ? ?Imaging Studies:  ?I ordered and viewed the following imaging, agree with radiologist impression:  ?CXR: No active cardiopulmonary disease.  ? ?ED Course:  ?I ordered medications including protonix bolus & drip for suspected upper GI bleed. Patient with 3 episodes of coffee ground emesis &1 melenotic BM @ home, melena on exam in the ED with worsening anemia, feel patient warrants admission for further evaluation & management. Discussed w/ attending Dr. Roderic Palau- in agreement. Discussed results & plan of care with patient and her sisters @ bedside who are in agreement.  ? ?00:05: CONSULT: Discussed with hospitalist Dr. Trilby Drummer- accepts admission.  ? ?Message sent to GI service for AM consultation.  ? ?Portions of this note were generated with L[employer]. Dictation errors may occur despite best attempts at proofreading. ? ? ?Final Clinical Impression(s) / ED Diagnoses ?Final diagnoses:  ?Gastrointestinal hemorrhage, unspecified gastrointestinal hemorrhage type  ? ? ?Rx / DC Orders ?ED Discharge Orders   ? ? None  ? ?  ? ? ?  ?Amaryllis Dyke,  PA-C ?09/13/21 0010 ? ?  ?Milton Ferguson, MD ?09/15/21 1122 ? ?

## 2021-09-12 NOTE — ED Triage Notes (Signed)
BIB [facility] with c/o GI bleeding. Reports 1 episode of coffee-ground emesis and black stools today. Currently receiving treatment for breast cancer with mets to bone. No hx of GIB in past.  ?

## 2021-09-13 ENCOUNTER — Encounter (HOSPITAL_COMMUNITY)
Admission: EM | Disposition: A | Discharge status: Home / Self Care | Payer: Self-pay | Source: Home / Self Care | Attending: Internal Medicine

## 2021-09-13 ENCOUNTER — Other Ambulatory Visit: Payer: Self-pay

## 2021-09-13 ENCOUNTER — Encounter (HOSPITAL_COMMUNITY): Payer: Self-pay | Admitting: Internal Medicine

## 2021-09-13 ENCOUNTER — Observation Stay (HOSPITAL_COMMUNITY): Payer: BC Managed Care – PPO | Admitting: Certified Registered Nurse Anesthetist

## 2021-09-13 DIAGNOSIS — K921 Melena: Principal | ICD-10-CM

## 2021-09-13 DIAGNOSIS — K2970 Gastritis, unspecified, without bleeding: Secondary | ICD-10-CM

## 2021-09-13 DIAGNOSIS — K254 Chronic or unspecified gastric ulcer with hemorrhage: Secondary | ICD-10-CM | POA: Diagnosis not present

## 2021-09-13 DIAGNOSIS — F411 Generalized anxiety disorder: Secondary | ICD-10-CM

## 2021-09-13 DIAGNOSIS — D62 Acute posthemorrhagic anemia: Secondary | ICD-10-CM | POA: Diagnosis present

## 2021-09-13 DIAGNOSIS — Z881 Allergy status to other antibiotic agents status: Secondary | ICD-10-CM | POA: Diagnosis not present

## 2021-09-13 DIAGNOSIS — K920 Hematemesis: Secondary | ICD-10-CM | POA: Diagnosis not present

## 2021-09-13 DIAGNOSIS — Z79899 Other long term (current) drug therapy: Secondary | ICD-10-CM | POA: Diagnosis not present

## 2021-09-13 DIAGNOSIS — K219 Gastro-esophageal reflux disease without esophagitis: Secondary | ICD-10-CM

## 2021-09-13 DIAGNOSIS — Z9011 Acquired absence of right breast and nipple: Secondary | ICD-10-CM | POA: Diagnosis not present

## 2021-09-13 DIAGNOSIS — D509 Iron deficiency anemia, unspecified: Secondary | ICD-10-CM | POA: Diagnosis present

## 2021-09-13 DIAGNOSIS — C50919 Malignant neoplasm of unspecified site of unspecified female breast: Secondary | ICD-10-CM | POA: Diagnosis present

## 2021-09-13 DIAGNOSIS — Z87442 Personal history of urinary calculi: Secondary | ICD-10-CM | POA: Diagnosis not present

## 2021-09-13 DIAGNOSIS — F32A Depression, unspecified: Secondary | ICD-10-CM

## 2021-09-13 DIAGNOSIS — C7951 Secondary malignant neoplasm of bone: Secondary | ICD-10-CM | POA: Diagnosis present

## 2021-09-13 DIAGNOSIS — Z853 Personal history of malignant neoplasm of breast: Secondary | ICD-10-CM | POA: Diagnosis not present

## 2021-09-13 DIAGNOSIS — K922 Gastrointestinal hemorrhage, unspecified: Secondary | ICD-10-CM | POA: Diagnosis present

## 2021-09-13 DIAGNOSIS — K2901 Acute gastritis with bleeding: Secondary | ICD-10-CM | POA: Diagnosis not present

## 2021-09-13 DIAGNOSIS — Z885 Allergy status to narcotic agent status: Secondary | ICD-10-CM | POA: Diagnosis not present

## 2021-09-13 DIAGNOSIS — E785 Hyperlipidemia, unspecified: Secondary | ICD-10-CM | POA: Diagnosis present

## 2021-09-13 DIAGNOSIS — D61818 Other pancytopenia: Secondary | ICD-10-CM | POA: Diagnosis present

## 2021-09-13 DIAGNOSIS — Z880 Allergy status to penicillin: Secondary | ICD-10-CM | POA: Diagnosis not present

## 2021-09-13 DIAGNOSIS — J309 Allergic rhinitis, unspecified: Secondary | ICD-10-CM | POA: Diagnosis present

## 2021-09-13 DIAGNOSIS — Z825 Family history of asthma and other chronic lower respiratory diseases: Secondary | ICD-10-CM | POA: Diagnosis not present

## 2021-09-13 DIAGNOSIS — K449 Diaphragmatic hernia without obstruction or gangrene: Secondary | ICD-10-CM | POA: Diagnosis present

## 2021-09-13 HISTORY — PX: BIOPSY: SHX5522

## 2021-09-13 HISTORY — PX: ESOPHAGOGASTRODUODENOSCOPY (EGD) WITH PROPOFOL: SHX5813

## 2021-09-13 LAB — COMPREHENSIVE METABOLIC PANEL
ALT: 11 U/L (ref 0–44)
AST: 11 U/L — ABNORMAL LOW (ref 15–41)
Albumin: 3.1 g/dL — ABNORMAL LOW (ref 3.5–5.0)
Alkaline Phosphatase: 33 U/L — ABNORMAL LOW (ref 38–126)
Anion gap: 5 (ref 5–15)
BUN: 15 mg/dL (ref 6–20)
CO2: 23 mmol/L (ref 22–32)
Calcium: 7.7 mg/dL — ABNORMAL LOW (ref 8.9–10.3)
Chloride: 113 mmol/L — ABNORMAL HIGH (ref 98–111)
Creatinine, Ser: 0.71 mg/dL (ref 0.44–1.00)
GFR, Estimated: >60 mL/min (ref >60)
Glucose, Bld: 91 mg/dL (ref 70–99)
Potassium: 3.6 mmol/L (ref 3.5–5.1)
Sodium: 141 mmol/L (ref 135–145)
Total Bilirubin: 0.5 mg/dL (ref 0.3–1.2)
Total Protein: 5.5 g/dL — ABNORMAL LOW (ref 6.5–8.1)

## 2021-09-13 LAB — CBC
HCT: 23.9 % — ABNORMAL LOW (ref 36.0–46.0)
HCT: 24.8 % — ABNORMAL LOW (ref 36.0–46.0)
Hemoglobin: 8.3 g/dL — ABNORMAL LOW (ref 12.0–15.0)
Hemoglobin: 8.7 g/dL — ABNORMAL LOW (ref 12.0–15.0)
MCH: 37.7 pg — ABNORMAL HIGH (ref 26.0–34.0)
MCH: 37.8 pg — ABNORMAL HIGH (ref 26.0–34.0)
MCHC: 34.7 g/dL (ref 30.0–36.0)
MCHC: 35.1 g/dL (ref 30.0–36.0)
MCV: 107.8 fL — ABNORMAL HIGH (ref 80.0–100.0)
MCV: 108.6 fL — ABNORMAL HIGH (ref 80.0–100.0)
Platelets: 134 10*3/uL — ABNORMAL LOW (ref 150–400)
Platelets: 136 10*3/uL — ABNORMAL LOW (ref 150–400)
RBC: 2.2 MIL/uL — ABNORMAL LOW (ref 3.87–5.11)
RBC: 2.3 MIL/uL — ABNORMAL LOW (ref 3.87–5.11)
RDW: 15.7 % — ABNORMAL HIGH (ref 11.5–15.5)
RDW: 15.9 % — ABNORMAL HIGH (ref 11.5–15.5)
WBC: 2.7 10*3/uL — ABNORMAL LOW (ref 4.0–10.5)
WBC: 3.1 10*3/uL — ABNORMAL LOW (ref 4.0–10.5)
nRBC: 0 % (ref 0.0–0.2)
nRBC: 0 % (ref 0.0–0.2)

## 2021-09-13 LAB — RETICULOCYTES
Immature Retic Fract: 43.9 % — ABNORMAL HIGH (ref 2.3–15.9)
RBC.: 2.3 MIL/uL — ABNORMAL LOW (ref 3.87–5.11)
Retic Count, Absolute: 49.4 10*3/uL (ref 19.0–186.0)
Retic Ct Pct: 2.2 % (ref 0.4–3.1)

## 2021-09-13 LAB — IRON AND TIBC
Iron: 112 ug/dL (ref 28–170)
Saturation Ratios: 30 % (ref 10.4–31.8)
TIBC: 374 ug/dL (ref 250–450)
UIBC: 262 ug/dL

## 2021-09-13 LAB — FERRITIN: Ferritin: 6 ng/mL — ABNORMAL LOW (ref 11–307)

## 2021-09-13 LAB — HEMOGLOBIN AND HEMATOCRIT, BLOOD
HCT: 27 % — ABNORMAL LOW (ref 36.0–46.0)
Hemoglobin: 9.4 g/dL — ABNORMAL LOW (ref 12.0–15.0)

## 2021-09-13 LAB — ABO/RH: ABO/RH(D): O POS

## 2021-09-13 LAB — HIV ANTIBODY (ROUTINE TESTING W REFLEX): HIV Screen 4th Generation wRfx: NONREACTIVE

## 2021-09-13 SURGERY — ESOPHAGOGASTRODUODENOSCOPY (EGD) WITH PROPOFOL
Anesthesia: Monitor Anesthesia Care

## 2021-09-13 MED ORDER — PROPOFOL 10 MG/ML IV BOLUS
INTRAVENOUS | Status: DC | PRN
  Administered 2021-09-13 (×3): 20 mg via INTRAVENOUS

## 2021-09-13 MED ORDER — LIDOCAINE 2% (20 MG/ML) 5 ML SYRINGE
INTRAMUSCULAR | Status: DC | PRN
  Administered 2021-09-13: 100 mg via INTRAVENOUS

## 2021-09-13 MED ORDER — ACETAMINOPHEN 325 MG PO TABS
650.0000 mg | ORAL_TABLET | Freq: Four times a day (QID) | ORAL | Status: DC | PRN
  Administered 2021-09-14 (×3): 650 mg via ORAL
  Filled 2021-09-13 (×3): qty 2

## 2021-09-13 MED ORDER — PROPOFOL 500 MG/50ML IV EMUL
INTRAVENOUS | Status: DC | PRN
  Administered 2021-09-13: 150 ug/kg/min via INTRAVENOUS

## 2021-09-13 MED ORDER — SODIUM CHLORIDE 0.9% IV SOLUTION: Freq: Once | INTRAVENOUS | Status: DC

## 2021-09-13 MED ORDER — LACTATED RINGERS IV SOLN: INTRAVENOUS | Status: DC | PRN

## 2021-09-13 MED ORDER — SODIUM CHLORIDE 0.9% FLUSH
3.0000 mL | Freq: Two times a day (BID) | INTRAVENOUS | Status: DC
  Administered 2021-09-13 – 2021-09-14 (×3): 3 mL via INTRAVENOUS

## 2021-09-13 MED ORDER — ACETAMINOPHEN 650 MG RE SUPP: 650.0000 mg | Freq: Four times a day (QID) | RECTAL | Status: DC | PRN

## 2021-09-13 MED ORDER — EPHEDRINE SULFATE-NACL 50-0.9 MG/10ML-% IV SOSY
PREFILLED_SYRINGE | INTRAVENOUS | Status: DC | PRN
  Administered 2021-09-13: 10 mg via INTRAVENOUS

## 2021-09-13 MED ORDER — SODIUM CHLORIDE 0.9 % IV SOLN: INTRAVENOUS | Status: AC

## 2021-09-13 MED ORDER — SODIUM CHLORIDE 0.9 % IV SOLN
510.0000 mg | Freq: Once | INTRAVENOUS | Status: AC
  Administered 2021-09-13: 510 mg via INTRAVENOUS
  Filled 2021-09-13: qty 17

## 2021-09-13 MED ORDER — FLUOXETINE HCL 20 MG PO CAPS
60.0000 mg | ORAL_CAPSULE | Freq: Every day | ORAL | Status: DC
Start: 2021-09-14 — End: 2021-09-14
  Administered 2021-09-14: 60 mg via ORAL
  Filled 2021-09-13: qty 3

## 2021-09-13 MED ORDER — PANTOPRAZOLE SODIUM 40 MG PO TBEC
40.0000 mg | DELAYED_RELEASE_TABLET | Freq: Two times a day (BID) | ORAL | Status: DC
  Administered 2021-09-13 – 2021-09-14 (×2): 40 mg via ORAL
  Filled 2021-09-13 (×2): qty 1

## 2021-09-13 SURGICAL SUPPLY — 15 items

## 2021-09-13 NOTE — Anesthesia Postprocedure Evaluation (Signed)
Anesthesia Post Note ? ?Patient: Caitlin Greene ? ?Procedure(s) Performed: ESOPHAGOGASTRODUODENOSCOPY (EGD) WITH PROPOFOL ?BIOPSY ? ?  ? ?Patient location during evaluation: Endoscopy ?Anesthesia Type: MAC ?Level of consciousness: awake ?Pain management: pain level controlled ?Vital Signs Assessment: post-procedure vital signs reviewed and stable ?Respiratory status: spontaneous breathing ?Cardiovascular status: stable ?Postop Assessment: no apparent nausea or vomiting ?Anesthetic complications: no ? ? ?No notable events documented. ? ?Last Vitals:  ?Vitals:  ? 09/13/21 1513 09/13/21 1548  ?BP: (!) 113/56 121/64  ?Pulse: 81 78  ?Resp: 12 16  ?Temp:  36.5 ?C  ?SpO2: 97% 98%  ?  ?Last Pain:  ?Vitals:  ? 09/13/21 1548  ?TempSrc: Oral  ?PainSc:   ? ? ?  ?  ?  ?  ?  ?  ? ?Ladine Kiper ? ? ? ? ?

## 2021-09-13 NOTE — Consult Note (Addendum)
? ? ?Consultation ? ?Referring Provider:  [facility]/ Earlie Counts ?Primary Care Physician:  Biagio Borg, MD ?Primary Gastroenterologist:  Dr.Jaquan Sadowsky- remote ? ?Reason for Consultation:   GI Bleed- melena and coffee ground emesis ? ?HPI: Caitlin Greene is a 56 y.o. female, who was admitted through the emergency room last evening after she presented with coffee-ground emesis x3 at home and melenic stool.  She says her symptoms started on Saturday with 1 black bowel movement, and then vomited x1 with coffee-ground material, no bright red blood.  She says about a month ago she noticed a stool that was partially black but then had no other symptoms and no recurrence so she did not worry about it.  She has not been having any abdominal discomfort, she does have chronic GERD symptoms for which she is on omeprazole 40 mg daily. ?Patient has metastatic breast cancer, initially diagnosed about 30 years ago and then developed bone mets.  She is followed by Dr. Marin Olp and has been on Ibrance and Faslodex.  She does not use any aspirin or NSAIDs. ?She had staging CT of the chest abdomen pelvis in March 2023 which did show a large hiatal hernia with intrathoracic stomach, scattered lytic and sclerotic osseous bone mets, a 13 mm left adrenal mass, colonic diverticulosis and large amount of stool. ?Labs done as an outpatient on 09/07/2021 with hemoglobin 10.9/hematocrit 31.1, WBC 1.9, platelets 138 ?On arrival yesterday WBC 3.0/hemoglobin 9.1/platelets 143, and this morning hemoglobin 8.7.  Iron studies have been done with ferritin of 6 ? ?Patient did undergo EGD per Dr. Tarri Glenn as an inpatient in November 2021 for nausea and vomiting, this showed a medium size hiatal hernia and mildly tortuous esophagus but was otherwise unremarkable. ? ?She has been hemodynamically stable since arrival, no further melena or hematemesis ? ? ?Past Medical History:  ?Diagnosis Date  ? ALLERGIC RHINITIS   ? ANEMIA-IRON DEFICIENCY   ? ANXIETY   ? BREAST CANCER, HX  OF 01/21/2007  ? at 56yo  ? DEPRESSION   ? GERD   ? HYPERLIPIDEMIA   ? Metastatic cancer to bone ([place]) dx'd 10/24/2019  ? recurrent breast ca  ? RENAL CALCULUS   ? rt breast ca dx'd 1988  ? ? ?Past Surgical History:  ?Procedure Laterality Date  ? BIOPSY  03/21/2020  ? Procedure: BIOPSY;  Surgeon: Thornton Park, MD;  Location: [facility];  Service: Gastroenterology;;  ? BREAST ENHANCEMENT SURGERY    ? ESOPHAGOGASTRODUODENOSCOPY (EGD) WITH PROPOFOL N/A 03/21/2020  ? Procedure: ESOPHAGOGASTRODUODENOSCOPY (EGD) WITH PROPOFOL;  Surgeon: Thornton Park, MD;  Location: [facility];  Service: Gastroenterology;  Laterality: N/A;  ? MASTECTOMY    ? right  ? TEMPOROMANDIBULAR JOINT SURGERY    ? Left  ? TONSILLECTOMY    ? ? ?Prior to Admission medications   ?Medication Sig Start Date End Date Taking? Authorizing Provider  ?acetaminophen (TYLENOL) 650 MG CR tablet Take 650 mg by mouth every 8 (eight) hours as needed for pain.   Yes [provider]  ?CALCIUM PO Take 1 tablet by mouth daily.   Yes [provider]  ?famciclovir (FAMVIR) 250 MG tablet Take 1 tablet (250 mg total) by mouth daily. ?Patient taking differently: Take 250 mg by mouth daily as needed (cold sore). 04/07/20  Yes [name], MD  ?FLUoxetine (PROZAC) 20 MG capsule TAKE 3 CAPSULES(60 MG) BY MOUTH DAILY ?Patient taking differently: 60 mg daily. 06/25/21  Yes Biagio Borg, MD  ?fulvestrant (FASLODEX) 250 MG/5ML injection Inject 500 mg  into the muscle every 30 (thirty) days. One injection each buttock over 1-2 minutes. Warm prior to use.   Yes [provider]  ?hydrOXYzine (ATARAX/VISTARIL) 25 MG tablet Take 25 mg by mouth 3 (three) times daily as needed for anxiety. 12/31/19  Yes [provider]  ?omeprazole (PRILOSEC) 20 MG capsule Take 1 capsule (20 mg total) by mouth daily. 06/25/21  Yes Biagio Borg, MD  ?ondansetron (ZOFRAN) 4 MG tablet Take 1 tablet (4 mg total) by mouth every 4 (four) hours as  needed. ?Patient taking differently: Take 4 mg by mouth every 4 (four) hours as needed for nausea. 03/24/20  Yes Shelly Coss, MD  ?oxyCODONE (OXY IR/ROXICODONE) 5 MG immediate release tablet Take 1 tablet (5 mg total) by mouth every 8 (eight) hours as needed for severe pain. 11/06/20  Yes [name], MD  ?polyethylene glycol (MIRALAX / GLYCOLAX) 17 g packet Take 17 g by mouth daily as needed. 03/24/20  Yes Shelly Coss, MD  ?vitamin B-12 (CYANOCOBALAMIN) 1000 MCG tablet Take 1 tablet (1,000 mcg total) by mouth daily. 03/24/20  Yes Shelly Coss, MD  ?COVID-19 mRNA bivalent vaccine, Pfizer, (PFIZER COVID-19 VAC BIVALENT) injection Inject into the muscle. 03/15/21   Carlyle Basques, MD  ?Leslee Home 100 MG tablet Take 100 mg by mouth See admin instructions. '100mg'$  daily on day 1-21 of cycle. 7 days off.  Repeat. ?Patient not taking: Reported on 09/13/2021 03/13/20   [provider]  ? ? ?Current Facility-Administered Medications  ?Medication Dose Route Frequency Provider Last Rate Last Admin  ? 0.9 %  sodium chloride infusion   Intravenous Continuous Marcelyn Bruins, MD 125 mL/hr at 09/13/21 0040 New Bag at 09/13/21 0040  ? acetaminophen (TYLENOL) tablet 650 mg  650 mg Oral Q6H PRN Marcelyn Bruins, MD      ? Or  ? acetaminophen (TYLENOL) suppository 650 mg  650 mg Rectal Q6H PRN Marcelyn Bruins, MD      ? ferumoxytol Shirlean Kelly) 510 mg in sodium chloride 0.9 % 100 mL IVPB  510 mg Intravenous Once [name], MD      ? Derrill Memo ON 09/14/2021] FLUoxetine (PROZAC) capsule 60 mg  60 mg Oral Daily Marcelyn Bruins, MD      ? pantoprozole (PROTONIX) 80 mg /NS 100 mL infusion  8 mg/hr Intravenous Continuous Marcelyn Bruins, MD 10 mL/hr at 09/13/21 0724 8 mg/hr at 09/13/21 0724  ? sodium chloride flush (NS) 0.9 % injection 3 mL  3 mL Intravenous Q12H Marcelyn Bruins, MD      ? ?Current Outpatient Medications  ?Medication Sig Dispense Refill  ? acetaminophen (TYLENOL) 650 MG CR tablet Take  650 mg by mouth every 8 (eight) hours as needed for pain.    ? CALCIUM PO Take 1 tablet by mouth daily.    ? famciclovir (FAMVIR) 250 MG tablet Take 1 tablet (250 mg total) by mouth daily. (Patient taking differently: Take 250 mg by mouth daily as needed (cold sore).) 30 tablet 6  ? FLUoxetine (PROZAC) 20 MG capsule TAKE 3 CAPSULES(60 MG) BY MOUTH DAILY (Patient taking differently: 60 mg daily.) 270 capsule 3  ? fulvestrant (FASLODEX) 250 MG/5ML injection Inject 500 mg into the muscle every 30 (thirty) days. One injection each buttock over 1-2 minutes. Warm prior to use.    ? hydrOXYzine (ATARAX/VISTARIL) 25 MG tablet Take 25 mg by mouth 3 (three) times daily as needed for anxiety.    ? omeprazole (PRILOSEC) 20 MG capsule  Take 1 capsule (20 mg total) by mouth daily. 90 capsule 3  ? ondansetron (ZOFRAN) 4 MG tablet Take 1 tablet (4 mg total) by mouth every 4 (four) hours as needed. (Patient taking differently: Take 4 mg by mouth every 4 (four) hours as needed for nausea.) 20 tablet 0  ? oxyCODONE (OXY IR/ROXICODONE) 5 MG immediate release tablet Take 1 tablet (5 mg total) by mouth every 8 (eight) hours as needed for severe pain. 50 tablet 0  ? polyethylene glycol (MIRALAX / GLYCOLAX) 17 g packet Take 17 g by mouth daily as needed. 28 each 0  ? vitamin B-12 (CYANOCOBALAMIN) 1000 MCG tablet Take 1 tablet (1,000 mcg total) by mouth daily. 30 tablet 1  ? COVID-19 mRNA bivalent vaccine, Pfizer, (PFIZER COVID-19 VAC BIVALENT) injection Inject into the muscle. 0.3 mL 0  ? IBRANCE 100 MG tablet Take 100 mg by mouth See admin instructions. '100mg'$  daily on day 1-21 of cycle. 7 days off.  Repeat. (Patient not taking: Reported on 09/13/2021)    ? ? ?Allergies as of 09/12/2021 - Review Complete 09/12/2021  ?Allergen Reaction Noted  ? Amoxicillin-pot clavulanate Hives and Itching 11/13/2010  ? Penicillins Hives, Itching, and Other (See Comments) 01/21/2007  ? Codeine Nausea And Vomiting 10/04/2017  ? ? ?Family History  ?Problem  Relation Age of Onset  ? Colon polyps Mother   ? Cancer Mother   ?     Uterine Cancer  ? Hypertension Mother   ? Dementia Mother   ? Colon polyps Father   ? COPD Father   ?     smoked  ? Hypertension Other   ? Diabetes

## 2021-09-13 NOTE — Interval H&P Note (Signed)
History and Physical Interval Note: ? ?09/13/2021 ?2:22 PM ? ?Caitlin Greene  has presented today for surgery, with the diagnosis of gi bleed.  The various methods of treatment have been discussed with the patient and family. After consideration of risks, benefits and other options for treatment, the patient has consented to  Procedure(s): ?ESOPHAGOGASTRODUODENOSCOPY (EGD) WITH PROPOFOL (N/A) as a surgical intervention.  The patient's history has been reviewed, patient examined, no change in status, stable for surgery.  I have reviewed the patient's chart and labs.  Questions were answered to the patient's satisfaction.   ? ? ?Milus Banister ? ? ?

## 2021-09-13 NOTE — Transfer of Care (Signed)
Immediate Anesthesia Transfer of Care Note ? ?Patient: Caitlin Greene ? ?Procedure(s) Performed: ESOPHAGOGASTRODUODENOSCOPY (EGD) WITH PROPOFOL ?BIOPSY ? ?Patient Location: PACU ? ?Anesthesia Type:MAC ? ?Level of Consciousness: awake ? ?Airway & Oxygen Therapy: Patient Spontanous Breathing and Patient connected to face mask oxygen ? ?Post-op Assessment: Report given to RN and Post -op Vital signs reviewed and stable ? ?Post vital signs: Reviewed and stable ? ?Last Vitals:  ?Vitals Value Taken Time  ?BP 115/59 09/13/21 1450  ?Temp    ?Pulse 93 09/13/21 1453  ?Resp 19 09/13/21 1453  ?SpO2 100 % 09/13/21 1453  ?Vitals shown include unvalidated device data. ? ?Last Pain:  ?Vitals:  ? 09/13/21 1333  ?TempSrc: Oral  ?PainSc: 0-No pain  ?   ? ?  ? ?Complications: No notable events documented. ?

## 2021-09-13 NOTE — Consult Note (Signed)
Referral MD ? ?Reason for Referral: GI bleeding ? ?Chief Complaint  ?Patient presents with  ? GI Bleeding  ?: I started to throw up blood. ? ?HPI: Caitlin Greene is well-known to me.  I saw her last week in the office.  She has a 56 year old white female.  She has metastatic breast cancer.  So far, the cancer of the lung been limited to her bones.  She is on antiestrogen therapy with Faslodex and Ibrance. ? ?When I saw her in the office, I noted that her hemoglobin was slightly lower.  However, I thought this may have been from her [facility] which can cause some bone marrow suppression. ? ?Over the weekend, she began to have melena.  Then yesterday, she began to have some coffee-ground emesis. ? ?She had a little bit of abdominal pain.  She has not been taking aspirin or nonsteroidals. ? ?She has had no weight loss or weight gain.  She has had no urinary issues.  There is been no fever. ? ?This morning, her white cell count 3.1.  Hemoglobin 8.7.  Platelet count 134,000.  Her ferritin is 6.  She will definitely need IV iron. ? ?Again I am not sure where the bleeding could be coming from.  As far as we can tell, there is no evidence of malignancy in her parenchymal organs. ? ?She has never had a bleeding before. ? ?She has had no cough or shortness of breath.  There is been no problems with COVID.  There is no rashes. ? ?Overall, I would say her performance status is probably ECOG 1. ? ?Past Medical History:  ?Diagnosis Date  ? ALLERGIC RHINITIS   ? ANEMIA-IRON DEFICIENCY   ? ANXIETY   ? BREAST CANCER, HX OF 01/21/2007  ? at 56yo  ? DEPRESSION   ? GERD   ? HYPERLIPIDEMIA   ? Metastatic cancer to bone ([place]) dx'd 10/24/2019  ? recurrent breast ca  ? RENAL CALCULUS   ? rt breast ca dx'd 1988  ?: ? ? ?Past Surgical History:  ?Procedure Laterality Date  ? BIOPSY  03/21/2020  ? Procedure: BIOPSY;  Surgeon: Thornton Park, MD;  Location: [facility];  Service: Gastroenterology;;  ? BREAST ENHANCEMENT SURGERY    ?  ESOPHAGOGASTRODUODENOSCOPY (EGD) WITH PROPOFOL N/A 03/21/2020  ? Procedure: ESOPHAGOGASTRODUODENOSCOPY (EGD) WITH PROPOFOL;  Surgeon: Thornton Park, MD;  Location: [facility];  Service: Gastroenterology;  Laterality: N/A;  ? MASTECTOMY    ? right  ? TEMPOROMANDIBULAR JOINT SURGERY    ? Left  ? TONSILLECTOMY    ?: ? ? ?Current Facility-Administered Medications:  ?  0.9 %  sodium chloride infusion, , Intravenous, Continuous, Marcelyn Bruins, MD, Last Rate: 125 mL/hr at 09/13/21 0040, New Bag at 09/13/21 0040 ?  acetaminophen (TYLENOL) tablet 650 mg, 650 mg, Oral, Q6H PRN **OR** acetaminophen (TYLENOL) suppository 650 mg, 650 mg, Rectal, Q6H PRN, Marcelyn Bruins, MD ?  Derrill Memo ON 09/14/2021] FLUoxetine (PROZAC) capsule 60 mg, 60 mg, Oral, Daily, Marcelyn Bruins, MD ?  pantoprozole (PROTONIX) 80 mg /NS 100 mL infusion, 8 mg/hr, Intravenous, Continuous, Marcelyn Bruins, MD, Last Rate: 10 mL/hr at 09/12/21 2313, 8 mg/hr at 09/12/21 2313 ?  sodium chloride flush (NS) 0.9 % injection 3 mL, 3 mL, Intravenous, Q12H, Marcelyn Bruins, MD ? ?Current Outpatient Medications:  ?  acetaminophen (TYLENOL) 650 MG CR tablet, Take 650 mg by mouth every 8 (eight) hours as needed for pain. (Patient not taking: Reported on 09/07/2021), Disp: ,  Rfl:  ?  CALCIUM PO, Take 1 tablet by mouth daily., Disp: , Rfl:  ?  COVID-19 mRNA bivalent vaccine, Pfizer, (PFIZER COVID-19 VAC BIVALENT) injection, Inject into the muscle., Disp: 0.3 mL, Rfl: 0 ?  doxycycline (VIBRA-TABS) 100 MG tablet, Take 1 tablet (100 mg total) by mouth 2 (two) times daily., Disp: 20 tablet, Rfl: 0 ?  famciclovir (FAMVIR) 250 MG tablet, Take 1 tablet (250 mg total) by mouth daily. (Patient not taking: Reported on 09/07/2021), Disp: 30 tablet, Rfl: 6 ?  FLUoxetine (PROZAC) 20 MG capsule, TAKE 3 CAPSULES(60 MG) BY MOUTH DAILY, Disp: 270 capsule, Rfl: 3 ?  hydrOXYzine (ATARAX/VISTARIL) 25 MG tablet, Take 25 mg by mouth 3 (three) times daily as needed for  anxiety. (Patient not taking: Reported on 09/07/2021), Disp: , Rfl:  ?  IBRANCE 100 MG tablet, Take 100 mg by mouth See admin instructions. '100mg'$  daily on day 1-21 of cycle. 7 days off.  Repeat., Disp: , Rfl:  ?  omeprazole (PRILOSEC) 20 MG capsule, Take 1 capsule (20 mg total) by mouth daily., Disp: 90 capsule, Rfl: 3 ?  ondansetron (ZOFRAN) 4 MG tablet, Take 1 tablet (4 mg total) by mouth every 4 (four) hours as needed. (Patient not taking: Reported on 09/07/2021), Disp: 20 tablet, Rfl: 0 ?  oxyCODONE (OXY IR/ROXICODONE) 5 MG immediate release tablet, Take 1 tablet (5 mg total) by mouth every 8 (eight) hours as needed for severe pain. (Patient not taking: Reported on 09/07/2021), Disp: 50 tablet, Rfl: 0 ?  polyethylene glycol (MIRALAX / GLYCOLAX) 17 g packet, Take 17 g by mouth daily as needed. (Patient not taking: Reported on 09/07/2021), Disp: 28 each, Rfl: 0 ?  SM SENNA LAXATIVE 8.6 MG tablet, Take 8.6 mg by mouth daily. (Patient not taking: Reported on 09/07/2021), Disp: , Rfl:  ?  vitamin B-12 (CYANOCOBALAMIN) 1000 MCG tablet, Take 1 tablet (1,000 mcg total) by mouth daily., Disp: 30 tablet, Rfl: 1: ? ? [START ON 09/14/2021] FLUoxetine  60 mg Oral Daily  ? sodium chloride flush  3 mL Intravenous Q12H  ?: ? ? ?Allergies  ?Allergen Reactions  ? Amoxicillin-Pot Clavulanate Hives and Itching  ? Penicillins Hives, Itching and Other (See Comments)  ? Codeine Nausea And Vomiting  ?  Needs pre-meds ?  ?: ? ? ?Family History  ?Problem Relation Age of Onset  ? Colon polyps Mother   ? Cancer Mother   ?     Uterine Cancer  ? Hypertension Mother   ? Dementia Mother   ? Colon polyps Father   ? COPD Father   ?     smoked  ? Hypertension Other   ? Diabetes Other   ? Asthma Sister   ?: ? ? ?Social History  ? ?Socioeconomic History  ? Marital status: Widowed  ?  Spouse name: Not on file  ? Number of children: 0  ? Years of education: Not on file  ? Highest education level: Not on file  ?Occupational History  ? Occupation: BANKRUPTCY  ANALYST  ?  Employer: [employer]  ?Tobacco Use  ? Smoking status: Never  ? Smokeless tobacco: Never  ?Vaping Use  ? Vaping Use: Never used  ?Substance and Sexual Activity  ? Alcohol use: No  ?  Alcohol/week: 0.0 standard drinks  ? Drug use: No  ? Sexual activity: Not on file  ?Other Topics Concern  ? Not on file  ?Social History Narrative  ? Not on file  ? ?Social Determinants of  Health  ? ?Financial Resource Strain: Not on file  ?Food Insecurity: Not on file  ?Transportation Needs: Not on file  ?Physical Activity: Not on file  ?Stress: Not on file  ?Social Connections: Not on file  ?Intimate Partner Violence: Not on file  ?: ? ?Review of Systems  ?Constitutional: Negative.   ?HENT: Negative.    ?Eyes: Negative.   ?Respiratory: Negative.    ?Cardiovascular: Negative.   ?Gastrointestinal:  Positive for blood in stool, melena and vomiting.  ?Genitourinary: Negative.   ?Musculoskeletal: Negative.   ?Skin: Negative.   ?Neurological: Negative.   ?Endo/Heme/Allergies: Negative.   ?Psychiatric/Behavioral: Negative.    ? ? ?Exam: ?Patient Vitals for the past 24 hrs: ? BP Temp Temp src Pulse Resp SpO2 Height Weight  ?09/13/21 0645 104/68 -- -- 77 -- 96 % -- --  ?09/13/21 0615 106/70 -- -- 74 -- 95 % -- --  ?09/13/21 0600 105/63 -- -- 80 -- 97 % -- --  ?09/13/21 0545 96/83 -- -- 79 -- 95 % -- --  ?09/13/21 0530 114/69 -- -- 89 -- 94 % -- --  ?09/13/21 0515 121/74 -- -- 89 -- 96 % -- --  ?09/13/21 0500 117/74 -- -- 78 -- 96 % -- --  ?09/13/21 0430 (!) 122/98 -- -- (!) 102 (!) 24 99 % -- --  ?09/13/21 0415 108/65 -- -- 88 20 97 % -- --  ?09/13/21 0400 114/85 -- -- 82 17 99 % -- --  ?09/13/21 0330 108/83 -- -- 80 18 94 % -- --  ?09/13/21 0315 (!) 92/57 -- -- 79 (!) 21 95 % -- --  ?09/13/21 0300 (!) 95/59 -- -- 79 18 95 % -- --  ?09/13/21 0245 113/81 -- -- 88 (!) 24 98 % -- --  ?09/13/21 0230 (!) 104/58 -- -- 87 15 97 % -- --  ?09/13/21 0215 116/73 -- -- 84 17 98 % -- --  ?09/13/21 0200 119/80 -- -- 96 20 100 % -- --   ?09/13/21 0145 101/61 -- -- 77 14 98 % -- --  ?09/13/21 0130 111/68 -- -- 91 13 98 % -- --  ?09/13/21 0115 122/88 -- -- 86 (!) 25 96 % -- --  ?09/13/21 0100 104/66 -- -- (!) 101 17 95 % -- --  ?09/13/21 0045 125/86 -- -- 79 11 98 %

## 2021-09-13 NOTE — Progress Notes (Signed)
?  Progress Note ? ? ?Patient: Caitlin Greene AVW:[id number] DOB: 04-05-1966 DOA: 09/12/2021     0 ?DOS: the patient was seen and examined on 09/13/2021 ?  ?Brief hospital course: ?56 y.o. female with medical history significant of hyperlipidemia, anemia, anxiety, depression, GERD, breast cancer, compression fracture presenting with GI bleeding. ? ?Patient presenting with episodes of GI bleeding.  Patient reports some weakness for the past several days to a week.  Today she was feeling achy abdominal pain that was intermittent.  Had a dark bowel movement this evening and then had some onset of nausea.  Subsequently she had 3 episodes of coffee-ground emesis prompting her to call EMS for transportation to the ED. ? ?Assessment and Plan: ?No notes have been filed under this hospital service. ?Service: Hospitalist ? ?GI bleed ?Acute loss Symptomatic anemia on chronic anemia ?> Patient presenting with achy abdominal pain, dark bowel movement, multiple episodes of coffee-ground emesis. ?> Hemoglobin of 9.1 down from baseline of 11-12. ?> Patient also reporting symptoms of fatigue, shortness of breath, lightheadedness. ?> Received IV PPI and started on IV PPI drip in ED. ?-seen by GI and pt now s/p EGD 5/8. Findings of hiatal hernia with linear erosions. GI rec to continue BID PPI x 2-3 mos ?  ?Breast cancer ?> Patient currently being treated for breast cancer with mets to the bone ?> Current regimen is Faslodex, [place] ?- Followed by Dr. Marin Olp ?  ?Anxiety ?Depression ?- Continue home fluoxetine ?  ?GERD ?- Now on BID PPI per GI ?  ? ?  ? ?Subjective: Without complaints this AM. Seen prior to EGD ? ?Physical Exam: ?Vitals:  ? 09/13/21 1453 09/13/21 1503 09/13/21 1513 09/13/21 1548  ?BP: (!) 115/59 (!) 110/51 (!) 113/56 121/64  ?Pulse: 91 89 81 78  ?Resp: '19 12 12 16  '$ ?Temp: (!) 97.5 ?F (36.4 ?C)   97.7 ?F (36.5 ?C)  ?TempSrc: Temporal   Oral  ?SpO2: 100% 95% 97% 98%  ?Weight:      ?Height:      ? ?General exam: Awake,  laying in bed, in nad ?Respiratory system: Normal respiratory effort, no wheezing ?Cardiovascular system: regular rate, s1, s2 ?Gastrointestinal system: Soft, nondistended, positive BS ?Central nervous system: CN2-12 grossly intact, strength intact ?Extremities: Perfused, no clubbing ?Skin: Normal skin turgor, no notable skin lesions seen ?Psychiatry: Mood normal // no visual hallucinations  ? ?Data Reviewed: ? ?Labs reviewed: Hgb 8.3 ? ?Family Communication: Pt in room, family not at bedside ? ?Disposition: ?Status is: Observation ?The patient remains OBS appropriate and will d/c before 2 midnights. ? Planned Discharge Destination: Home ? ? ? ?Author: ?Marylu Lund, MD ?09/13/2021 5:00 PM ? ?For on call review [email or web address].  ?

## 2021-09-13 NOTE — H&P (Addendum)
?History and Physical  ? ?[name] PYK:[id number] DOB: April 18, 1966 DOA: 09/12/2021 ? ?PCP: Biagio Borg, MD  ? ?Patient coming from: Home ? ?Chief Complaint: GI bleed ? ?HPI: Caitlin Greene is a 56 y.o. female with medical history significant of hyperlipidemia, anemia, anxiety, depression, GERD, breast cancer, compression fracture presenting with GI bleeding. ? ?Patient presenting with episodes of GI bleeding.  Patient reports some weakness for the past several days to a week.  Today she was feeling achy abdominal pain that was intermittent.  Had a dark bowel movement this evening and then had some onset of nausea.  Subsequently she had 3 episodes of coffee-ground emesis prompting her to call EMS for transportation to the ED. ? ?She reports intermittent fatigue, shortness of breath, lightheadedness especially with exertion.  She denies any NSAIDs or alcohol use.  She denies fevers, chills, chest pain. ? ?ED Course: Vital signs in the ED stable.  Lab work-up showed CMP with BUN elevated 25, glucose 102, calcium 8.4, protein 6.4.  CBC with hemoglobin of 9.1 down from baseline of 11-12.  Mild leukopenia at 3 and thrombocytopenia at 143.  PT and INR within normal limits.  Lipase normal.  FOBT positive.  Patient was typed and screened in the ED.  Chest x-ray showed no acute abnormalities.  Patient start on PPI drip in the ED.  Received a liter of fluids as well. ? ?Review of Systems: As per HPI otherwise all other systems reviewed and are negative. ? ?Past Medical History:  ?Diagnosis Date  ? ALLERGIC RHINITIS   ? ANEMIA-IRON DEFICIENCY   ? ANXIETY   ? BREAST CANCER, HX OF 01/21/2007  ? at 56yo  ? DEPRESSION   ? GERD   ? HYPERLIPIDEMIA   ? Metastatic cancer to bone ([place]) dx'd 10/24/2019  ? recurrent breast ca  ? RENAL CALCULUS   ? rt breast ca dx'd 1988  ? ? ?Past Surgical History:  ?Procedure Laterality Date  ? BIOPSY  03/21/2020  ? Procedure: BIOPSY;  Surgeon: Thornton Park, MD;  Location: [facility];  Service:  Gastroenterology;;  ? BREAST ENHANCEMENT SURGERY    ? ESOPHAGOGASTRODUODENOSCOPY (EGD) WITH PROPOFOL N/A 03/21/2020  ? Procedure: ESOPHAGOGASTRODUODENOSCOPY (EGD) WITH PROPOFOL;  Surgeon: Thornton Park, MD;  Location: [facility];  Service: Gastroenterology;  Laterality: N/A;  ? MASTECTOMY    ? right  ? TEMPOROMANDIBULAR JOINT SURGERY    ? Left  ? TONSILLECTOMY    ? ? ?Social History ? reports that she has never smoked. She has never used smokeless tobacco. She reports that she does not drink alcohol and does not use drugs. ? ?Allergies  ?Allergen Reactions  ? Amoxicillin-Pot Clavulanate Hives and Itching  ? Penicillins Hives, Itching and Other (See Comments)  ? Codeine Nausea And Vomiting  ?  Needs pre-meds ?  ? ? ?Family History  ?Problem Relation Age of Onset  ? Colon polyps Mother   ? Cancer Mother   ?     Uterine Cancer  ? Hypertension Mother   ? Dementia Mother   ? Colon polyps Father   ? COPD Father   ?     smoked  ? Hypertension Other   ? Diabetes Other   ? Asthma Sister   ?Reviewed on admission ? ?Prior to Admission medications   ?Medication Sig Start Date End Date Taking? Authorizing Provider  ?acetaminophen (TYLENOL) 650 MG CR tablet Take 650 mg by mouth every 8 (eight) hours as needed for pain. ?Patient not taking: Reported on 09/07/2021  [provider]  ?CALCIUM PO Take 1 tablet by mouth daily.    [provider]  ?COVID-19 mRNA bivalent vaccine, Pfizer, (PFIZER COVID-19 VAC BIVALENT) injection Inject into the muscle. 03/15/21   Carlyle Basques, MD  ?doxycycline (VIBRA-TABS) 100 MG tablet Take 1 tablet (100 mg total) by mouth 2 (two) times daily. 06/25/21   Biagio Borg, MD  ?famciclovir (FAMVIR) 250 MG tablet Take 1 tablet (250 mg total) by mouth daily. ?Patient not taking: Reported on 09/07/2021 04/07/20   [name], MD  ?FLUoxetine (PROZAC) 20 MG capsule TAKE 3 CAPSULES(60 MG) BY MOUTH DAILY 06/25/21   Biagio Borg, MD  ?hydrOXYzine (ATARAX/VISTARIL) 25 MG tablet Take 25  mg by mouth 3 (three) times daily as needed for anxiety. ?Patient not taking: Reported on 09/07/2021 12/31/19   [provider]  ?IBRANCE 100 MG tablet Take 100 mg by mouth See admin instructions. '100mg'$  daily on day 1-21 of cycle. 7 days off.  Repeat. 03/13/20   [provider]  ?omeprazole (PRILOSEC) 20 MG capsule Take 1 capsule (20 mg total) by mouth daily. 06/25/21   Biagio Borg, MD  ?ondansetron (ZOFRAN) 4 MG tablet Take 1 tablet (4 mg total) by mouth every 4 (four) hours as needed. ?Patient not taking: Reported on 09/07/2021 03/24/20   Shelly Coss, MD  ?oxyCODONE (OXY IR/ROXICODONE) 5 MG immediate release tablet Take 1 tablet (5 mg total) by mouth every 8 (eight) hours as needed for severe pain. ?Patient not taking: Reported on 09/07/2021 11/06/20   [name], MD  ?polyethylene glycol (MIRALAX / GLYCOLAX) 17 g packet Take 17 g by mouth daily as needed. ?Patient not taking: Reported on 09/07/2021 03/24/20   Shelly Coss, MD  ?SM SENNA LAXATIVE 8.6 MG tablet Take 8.6 mg by mouth daily. ?Patient not taking: Reported on 09/07/2021 10/30/19   [provider]  ?vitamin B-12 (CYANOCOBALAMIN) 1000 MCG tablet Take 1 tablet (1,000 mcg total) by mouth daily. 03/24/20   Shelly Coss, MD  ? ? ?Physical Exam: ?Vitals:  ? 09/12/21 2300 09/12/21 2315 09/12/21 2330 09/12/21 2345  ?BP: 128/86 126/86 99/64 105/78  ?Pulse: 96 89 90 83  ?Resp: (!) 22 (!) '25 16 14  '$ ?Temp:      ?TempSrc:      ?SpO2: 98% 100% 99% 99%  ?Weight:      ?Height:      ? ? ?Physical Exam ?Constitutional:   ?   General: She is not in acute distress. ?   Appearance: Normal appearance.  ?HENT:  ?   Head: Normocephalic and atraumatic.  ?   Mouth/Throat:  ?   Mouth: Mucous membranes are moist.  ?   Pharynx: Oropharynx is clear.  ?Eyes:  ?   Extraocular Movements: Extraocular movements intact.  ?   Pupils: Pupils are equal, round, and reactive to light.  ?Cardiovascular:  ?   Rate and Rhythm: Normal rate and regular rhythm.  ?    Pulses: Normal pulses.  ?   Heart sounds: Normal heart sounds.  ?Pulmonary:  ?   Effort: Pulmonary effort is normal. No respiratory distress.  ?   Breath sounds: Normal breath sounds.  ?Abdominal:  ?   General: Bowel sounds are normal. There is no distension.  ?   Palpations: Abdomen is soft.  ?   Tenderness: There is no abdominal tenderness.  ?Musculoskeletal:     ?   General: No swelling or deformity.  ?Skin: ?   General: Skin is warm  and dry.  ?Neurological:  ?   General: No focal deficit present.  ?   Mental Status: Mental status is at baseline.  ? ?Labs on Admission: I have personally reviewed following labs and imaging studies ? ?CBC: ?Recent Labs  ?Lab 09/07/21 ?1017 09/12/21 ?2215  ?WBC 1.9* 3.0*  ?NEUTROABS 1.0* 1.5*  ?HGB 10.9* 9.1*  ?HCT 31.1* 26.0*  ?MCV 106.5* 107.9*  ?PLT 138* 143*  ? ? ?Basic Metabolic Panel: ?Recent Labs  ?Lab 09/07/21 ?1017 09/12/21 ?2215  ?NA 140 140  ?K 4.2 3.5  ?CL 108 110  ?CO2 26 25  ?GLUCOSE 96 102*  ?BUN 14 25*  ?CREATININE 1.09* 0.89  ?CALCIUM 9.1 8.4*  ? ? ?GFR: ?Estimated Creatinine Clearance: 82.1 mL/min (by C-G formula based on SCr of 0.89 mg/dL). ? ?Liver Function Tests: ?Recent Labs  ?Lab 09/07/21 ?1017 09/12/21 ?2215  ?AST 11* 13*  ?ALT 9 12  ?ALKPHOS 47 43  ?BILITOT 0.5 0.6  ?PROT 6.9 6.4*  ?ALBUMIN 4.3 3.7  ? ? ?Urine analysis: ?   ?Component Value Date/Time  ? [place] YELLOW 04/12/2018 1413  ? APPEARANCEUR CLEAR 04/12/2018 1413  ? LABSPEC 1.025 04/12/2018 1413  ? PHURINE 6.5 04/12/2018 1413  ? GLUCOSEU NEGATIVE 04/12/2018 1413  ? [place] NEGATIVE 04/12/2018 1413  ? BILIRUBINUR negative 07/06/2019 1507  ? BILIRUBINUR 1 12/20/2014 1007  ? KETONESUR negative 07/06/2019 1507  ? [place] NEGATIVE 04/12/2018 1413  ? PROTEINUR negative 07/06/2019 1507  ? PROTEINUR neg 12/20/2014 1007  ? UROBILINOGEN 1.0 07/06/2019 1507  ? UROBILINOGEN 0.2 04/12/2018 1413  ? NITRITE Negative 07/06/2019 1507  ? NITRITE NEGATIVE 04/12/2018 1413  ? LEUKOCYTESUR Negative 07/06/2019 1507   ? ? ?Radiological Exams on Admission: ?DG Chest 2 View ? ?Result Date: 09/12/2021 ?CLINICAL DATA:  Dyspnea EXAM: CHEST - 2 VIEW COMPARISON:  None Available. FINDINGS: The heart size and mediastinal contours are w

## 2021-09-13 NOTE — Hospital Course (Signed)
56 y.o. female with medical history significant of hyperlipidemia, anemia, anxiety, depression, GERD, breast cancer, compression fracture presenting with GI bleeding. ? ?Patient presenting with episodes of GI bleeding.  Patient reports some weakness for the past several days to a week.  Today she was feeling achy abdominal pain that was intermittent.  Had a dark bowel movement this evening and then had some onset of nausea.  Subsequently she had 3 episodes of coffee-ground emesis prompting her to call EMS for transportation to the ED. ?

## 2021-09-13 NOTE — Anesthesia Preprocedure Evaluation (Addendum)
Anesthesia Evaluation  ?Patient identified by MRN, date of birth, ID band ?Patient awake ? ? ? ?Reviewed: ?Allergy & Precautions, NPO status , Patient's Chart, lab work & pertinent test results ? ?Airway ?Mallampati: II ? ?TM Distance: >3 FB ? ? ? ? Dental ?  ?Pulmonary ?pneumonia,  ?  ?breath sounds clear to auscultation ? ? ? ? ? ? Cardiovascular ?negative cardio ROS ? ? ?Rhythm:Regular Rate:Normal ? ? ?  ?Neuro/Psych ?PSYCHIATRIC DISORDERS negative neurological ROS ?   ? GI/Hepatic ?GERD  ,  ?Endo/Other  ?negative endocrine ROS ? Renal/GU ?Renal disease  ? ?  ?Musculoskeletal ? ? Abdominal ?  ?Peds ? Hematology ?  ?Anesthesia Other Findings ? ? Reproductive/Obstetrics ? ?  ? ? ? ? ? ? ? ? ? ? ? ? ? ?  ?  ? ? ? ? ? ? ? ? ?Anesthesia Physical ?Anesthesia Plan ? ?ASA: 3 ? ?Anesthesia Plan: MAC  ? ?Post-op Pain Management:   ? ?Induction: Intravenous ? ?PONV Risk Score and Plan: 2 and Ondansetron, Dexamethasone and Midazolam ? ?Airway Management Planned: Simple Face Mask ? ?Additional Equipment:  ? ?Intra-op Plan:  ? ?Post-operative Plan:  ? ?Informed Consent: I have reviewed the patients History and Physical, chart, labs and discussed the procedure including the risks, benefits and alternatives for the proposed anesthesia with the patient or authorized representative who has indicated his/her understanding and acceptance.  ? ? ? ?Dental advisory given ? ?Plan Discussed with: CRNA and Anesthesiologist ? ?Anesthesia Plan Comments:   ? ? ? ? ? ? ?Anesthesia Quick Evaluation ? ?

## 2021-09-13 NOTE — Op Note (Signed)
[facility] ?Patient Name: Caitlin Greene ?Procedure Date: 09/13/2021 ?MRN: [id number] ?Attending MD: Milus Banister , MD ?Date of Birth: August 09, 1965 ?CSN: [id number] ?Age: 56 ?Admit Type: Inpatient ?Procedure:                Upper GI endoscopy ?Indications:              Coffee-ground emesis, Melena ?Providers:                Milus Banister, MD, Burtis Junes, RN, Alphonzo Grieve  ?                          Leighton Roach, Technician ?Referring MD:              ?Medicines:                Monitored Anesthesia Care ?Complications:            No immediate complications. Estimated blood loss:  ?                          None. ?Estimated Blood Loss:     Estimated blood loss: none. ?Procedure:                Pre-Anesthesia Assessment: ?                          - Prior to the procedure, a History and Physical  ?                          was performed, and patient medications and  ?                          allergies were reviewed. The patient's tolerance of  ?                          previous anesthesia was also reviewed. The risks  ?                          and benefits of the procedure and the sedation  ?                          options and risks were discussed with the patient.  ?                          All questions were answered, and informed consent  ?                          was obtained. Prior Anticoagulants: The patient has  ?                          taken no previous anticoagulant or antiplatelet  ?                          agents. ASA Grade Assessment: II - A patient with  ?                          mild  systemic disease. After reviewing the risks  ?                          and benefits, the patient was deemed in  ?                          satisfactory condition to undergo the procedure. ?                          After obtaining informed consent, the endoscope was  ?                          passed under direct vision. Throughout the  ?                          procedure, the patient's blood pressure,  pulse, and  ?                          oxygen saturations were monitored continuously. The  ?                          [id number]-H190 ([id number]) Olympus endoscope was introduced  ?                          through the mouth, and advanced to the second part  ?                          of duodenum. The upper GI endoscopy was  ?                          accomplished without difficulty. The patient  ?                          tolerated the procedure well. ?Scope In: ?Scope Out: ?Findings: ?     A large hiatal hernia with several classic appearing linear Cameron's  ?     type erosions. ?     Mild inflammation characterized by erythema was found in the gastric  ?     antrum. Biopsies were taken with a cold forceps for histology. ?     The exam was otherwise without abnormality. ?Impression:               - A large hiatal hernia with several classic  ?                          appearing linear Cameron's type erosions. ?                          - Mild, non-specific distal gastritis. Biopsied to  ?                          check for H. pylori. ?                          - The examination was otherwise normal. ?                          -  The hiatal hernia Cameron's erosions are the  ?                          source of her recent GI bleeding. Will stop IV PPI  ?                          and start BID PPI for at least the next 2-3 months.  ?                          She was on omeprazole '20mg'$  PO daily prior to this  ?                          admission. She is OK to resume solid food and if  ?                          she has no overt rebleeding she will probably be  ?                          safe for d/c tomorrow to follow up with me in the  ?                          office in 5-6 weeks. ?Moderate Sedation: ?     Not Applicable - Patient had care per Anesthesia. ?Recommendation:           - Await pathology results. ?Procedure Code(s):        --- Professional --- ?                          [phone], Esophagogastroduodenoscopy,  flexible,  ?                          transoral; with biopsy, single or multiple ?Diagnosis Code(s):        --- Professional --- ?                          K44.9, Diaphragmatic hernia without obstruction or  ?                          gangrene ?                          K29.70, Gastritis, unspecified, without bleeding ?                          K92.0, Hematemesis ?                          K92.1, Melena (includes Hematochezia) ?CPT copyright 2019 American Medical Association. All rights reserved. ?The codes documented in this report are preliminary and upon coder review may  ?be revised to meet current compliance requirements. ?Milus Banister, MD ?09/13/2021 2:52:46 PM ?This report has been signed electronically. ?Number of Addenda: 0 ?

## 2021-09-14 ENCOUNTER — Encounter (HOSPITAL_COMMUNITY): Payer: Self-pay | Admitting: Gastroenterology

## 2021-09-14 DIAGNOSIS — D62 Acute posthemorrhagic anemia: Secondary | ICD-10-CM

## 2021-09-14 DIAGNOSIS — K254 Chronic or unspecified gastric ulcer with hemorrhage: Secondary | ICD-10-CM

## 2021-09-14 DIAGNOSIS — K2901 Acute gastritis with bleeding: Secondary | ICD-10-CM | POA: Diagnosis not present

## 2021-09-14 DIAGNOSIS — F411 Generalized anxiety disorder: Secondary | ICD-10-CM | POA: Diagnosis not present

## 2021-09-14 LAB — CBC
HCT: 23.7 % — ABNORMAL LOW (ref 36.0–46.0)
Hemoglobin: 7.9 g/dL — ABNORMAL LOW (ref 12.0–15.0)
MCH: 37.1 pg — ABNORMAL HIGH (ref 26.0–34.0)
MCHC: 33.3 g/dL (ref 30.0–36.0)
MCV: 111.3 fL — ABNORMAL HIGH (ref 80.0–100.0)
Platelets: 125 10*3/uL — ABNORMAL LOW (ref 150–400)
RBC: 2.13 MIL/uL — ABNORMAL LOW (ref 3.87–5.11)
RDW: 16 % — ABNORMAL HIGH (ref 11.5–15.5)
WBC: 2.5 10*3/uL — ABNORMAL LOW (ref 4.0–10.5)
nRBC: 0 % (ref 0.0–0.2)

## 2021-09-14 LAB — HEMOGLOBIN AND HEMATOCRIT, BLOOD
HCT: 27 % — ABNORMAL LOW (ref 36.0–46.0)
Hemoglobin: 8.8 g/dL — ABNORMAL LOW (ref 12.0–15.0)

## 2021-09-14 MED ORDER — PANTOPRAZOLE SODIUM 40 MG PO TBEC
40.0000 mg | DELAYED_RELEASE_TABLET | Freq: Two times a day (BID) | ORAL | 0 refills | Status: DC
Start: 2021-09-14 — End: 2021-09-15

## 2021-09-14 MED ORDER — FOLIC ACID 1 MG PO TABS
2.0000 mg | ORAL_TABLET | Freq: Every day | ORAL | Status: DC
  Administered 2021-09-14: 2 mg via ORAL
  Filled 2021-09-14: qty 2

## 2021-09-14 MED ORDER — SUCRALFATE 1 G PO TABS: 1.0000 g | ORAL_TABLET | Freq: Four times a day (QID) | ORAL | 0 refills | Status: DC

## 2021-09-14 NOTE — Discharge Summary (Signed)
?Physician Discharge Summary ?  ?Patient: Caitlin Greene MRN: [id number] DOB: [birth year]  ?Admit date:     09/12/2021  ?Discharge date: 09/14/21  ?Discharge Physician: Marylu Lund  ? ?PCP: Biagio Borg, MD  ? ?Recommendations at discharge:  ? ? Follow up with PCP in 1-2 weeks ?Follow up with Oncology as scheduled ?Follow up with GI as scheduled in 6-7 weeks ? ?Discharge Diagnoses: ?Principal Problem: ?  GI bleed ?Active Problems: ?  HLD (hyperlipidemia) ?  ANEMIA-IRON DEFICIENCY ?  Anxiety state ?  Depression ?  GERD ?  Acute blood loss anemia ? ?Resolved Problems: ?  * No resolved hospital problems. * ? ?Hospital Course: ?56 y.o. female with medical history significant of hyperlipidemia, anemia, anxiety, depression, GERD, breast cancer, compression fracture presenting with GI bleeding. ? ?Patient presenting with episodes of GI bleeding.  Patient reports some weakness for the past several days to a week.  Today she was feeling achy abdominal pain that was intermittent.  Had a dark bowel movement this evening and then had some onset of nausea.  Subsequently she had 3 episodes of coffee-ground emesis prompting her to call EMS for transportation to the ED. ? ?Assessment and Plan: ?No notes have been filed under this hospital service. ?Service: Hospitalist ? ?GI bleed ?Acute loss Symptomatic anemia on chronic anemia ?> Patient presenting with achy abdominal pain, dark bowel movement, multiple episodes of coffee-ground emesis. ?> Hemoglobin of 9.1 down from baseline of 11-12. ?> Patient also reporting symptoms of fatigue, shortness of breath, lightheadedness. ?> Received IV PPI and started on IV PPI drip ?-Hgb remained stable ?-seen by GI and pt now s/p EGD 5/8. Findings of hiatal hernia with linear erosions. GI rec to continue BID PPI with one month of carafate ?  ?Breast cancer ?> Patient currently being treated for breast cancer with mets to the bone ?> Current regimen is Faslodex, Brock Bad ?- Followed by Dr.  Marin Olp ?  ?Anxiety ?Depression ?- Continue home fluoxetine ?  ?GERD ?- Now on BID PPI per GI ?  ?  ? ? ?Consultants: Oncology, GI ?Procedures performed: EGD 5/8  ?Disposition: Home ?Diet recommendation:  ?Regular diet ?DISCHARGE MEDICATION: ?Allergies as of 09/14/2021   ? ?   Reactions  ? Amoxicillin-pot Clavulanate Hives, Itching  ? Penicillins Hives, Itching, Other (See Comments)  ? Codeine Nausea And Vomiting  ? Needs pre-meds  ? ?  ? ?  ?Medication List  ?  ? ?STOP taking these medications   ? ?omeprazole 20 MG capsule ?Commonly known as: PRILOSEC ?  ? ?  ? ?TAKE these medications   ? ?acetaminophen 650 MG CR tablet ?Commonly known as: TYLENOL ?Take 650 mg by mouth every 8 (eight) hours as needed for pain. ?  ?CALCIUM PO ?Take 1 tablet by mouth daily. ?  ?famciclovir 250 MG tablet ?Commonly known as: FAMVIR ?Take 1 tablet (250 mg total) by mouth daily. ?What changed:  ?when to take this ?reasons to take this ?  ?FLUoxetine 20 MG capsule ?Commonly known as: PROZAC ?TAKE 3 CAPSULES(60 MG) BY MOUTH DAILY ?What changed:  ?how much to take ?when to take this ?additional instructions ?  ?fulvestrant 250 MG/5ML injection ?Commonly known as: FASLODEX ?Inject 500 mg into the muscle every 30 (thirty) days. One injection each buttock over 1-2 minutes. Warm prior to use. ?  ?hydrOXYzine 25 MG tablet ?Commonly known as: ATARAX ?Take 25 mg by mouth 3 (three) times daily as needed for anxiety. ?  ?Ibrance 100 MG tablet ?  Generic drug: palbociclib ?Take 100 mg by mouth See admin instructions. '100mg'$  daily on day 1-21 of cycle. 7 days off.  Repeat. ?  ?ondansetron 4 MG tablet ?Commonly known as: ZOFRAN ?Take 1 tablet (4 mg total) by mouth every 4 (four) hours as needed. ?What changed: reasons to take this ?  ?oxyCODONE 5 MG immediate release tablet ?Commonly known as: Oxy IR/ROXICODONE ?Take 1 tablet (5 mg total) by mouth every 8 (eight) hours as needed for severe pain. ?  ?pantoprazole 40 MG tablet ?Commonly known as:  PROTONIX ?Take 1 tablet (40 mg total) by mouth 2 (two) times daily before a meal. ?  ?Pfizer COVID-19 Vac Bivalent injection ?Generic drug: COVID-19 mRNA bivalent vaccine [employer]) ?Inject into the muscle. ?  ?polyethylene glycol 17 g packet ?Commonly known as: MIRALAX / GLYCOLAX ?Take 17 g by mouth daily as needed. ?  ?sucralfate 1 g tablet ?Commonly known as: Carafate ?Take 1 tablet (1 g total) by mouth 4 (four) times daily. ?  ?vitamin B-12 1000 MCG tablet ?Commonly known as: CYANOCOBALAMIN ?Take 1 tablet (1,000 mcg total) by mouth daily. ?  ? ?  ? ? Follow-up Information   ? ? Biagio Borg, MD Follow up in 2 week(s).   ?Specialties: Internal Medicine, Radiology ?Why: Hospital follow up ?Contact information: ?[place] [address] ?[phone] ? ? ?  ?  ? ? [name], MD Follow up.   ?Specialty: Oncology ?Why: as scheduled ?Contact information: ?[address] ?[address] ?[place] [address] ?[phone] ? ? ?  ?  ? ? Milus Banister, MD Follow up.   ?Specialty: Gastroenterology ?Why: as scheduled in 6-7 weeks ?Contact information: ?[address] ?[place] [address] ?[phone] ? ? ?  ?  ? ?  ?  ? ?  ? ?Discharge Exam: ?Filed Weights  ? 09/12/21 2203 09/12/21 2212 09/13/21 1333  ?Weight: 90.8 kg 90.7 kg 90.7 kg  ? ?General exam: Awake, laying in bed, in nad ?Respiratory system: Normal respiratory effort, no wheezing ?Cardiovascular system: regular rate, s1, s2 ?Gastrointestinal system: Soft, nondistended, positive BS ?Central nervous system: CN2-12 grossly intact, strength intact ?Extremities: Perfused, no clubbing ?Skin: Normal skin turgor, no notable skin lesions seen ?Psychiatry: Mood normal // no visual hallucinations  ? ?Condition at discharge: fair ? ?The results of significant diagnostics from this hospitalization (including imaging, microbiology, ancillary and laboratory) are listed below for reference.  ? ?Imaging Studies: ?DG Chest 2 View ? ?Result Date:  09/12/2021 ?CLINICAL DATA:  Dyspnea EXAM: CHEST - 2 VIEW COMPARISON:  None Available. FINDINGS: The heart size and mediastinal contours are within normal limits. Both lungs are clear. Multilevel vertebral augmentation. Hiatal hernia. IMPRESSION: No active cardiopulmonary disease. Electronically Signed   By: Ulyses Jarred M.D.   On: 09/12/2021 22:59   ? ?Microbiology: ?Results for orders placed or performed during the hospital encounter of 03/19/20  ?Respiratory Panel by RT PCR (Flu A&B, Covid) - Nasopharyngeal Swab     Status: None  ? Collection Time: 03/19/20  9:10 AM  ? Specimen: Nasopharyngeal Swab  ?Result Value Ref Range Status  ? SARS Coronavirus 2 by RT PCR NEGATIVE NEGATIVE Final  ?  Comment: (NOTE) ?SARS-CoV-2 target nucleic acids are NOT DETECTED. ? ?The SARS-CoV-2 RNA is generally detectable in upper respiratoy ?specimens during the acute phase of infection. The lowest ?concentration of SARS-CoV-2 viral copies this assay can detect is ?131 copies/mL. A negative result does not preclude SARS-Cov-2 ?infection and should  not be used as the sole basis for treatment or ?other patient management decisions. A negative result may occur with  ?improper specimen collection/handling, submission of specimen other ?than nasopharyngeal swab, presence of viral mutation(s) within the ?areas targeted by this assay, and inadequate number of viral copies ?(<131 copies/mL). A negative result must be combined with clinical ?observations, patient history, and epidemiological information. The ?expected result is Negative. ? ?Fact Sheet for Patients:  ?PinkCheek.be ? ?Fact Sheet for Healthcare Providers:  ?GravelBags.it ? ?This test is no t yet approved or cleared by the [place] FDA and  ?has been authorized for detection and/or diagnosis of SARS-CoV-2 by ?FDA under an Emergency Use Authorization (EUA). This EUA will remain  ?in effect (meaning this test can be used)  for the duration of the ?COVID-19 declaration under Section 564(b)(1) of the Act, 21 U.S.C. ?section 360bbb-3(b)(1), unless the authorization is terminated or ?revoked sooner. ? ?  ? Influenza A by PCR NEGATIVE NEGATIVE Fi

## 2021-09-14 NOTE — Progress Notes (Signed)
Ms. Caitlin Greene is doing little better.  [facility] did a upper endoscopy on her yesterday.  Looks like she may have had some erosions.  She is on some oral PPI. ? ?She feels tired.  Hemoglobin is dropped.  She did get IV iron yesterday.  I will put her on some folic acid. ? ?Her hemoglobin is 7.9.  Her platelet count 125,000.  White cell count 2.5. ? ?She hopefully will to eat a little bit more today. ? ?She is not having any abdominal pain. ? ?There is no fever.  She has had no nausea or vomiting. ? ?Her vital signs show temperature of 97.6.  Pulse 78.  Blood pressure 100/61.  Her head neck exam shows no ocular or oral lesions.  Her lungs are clear bilaterally.  Cardiac exam regular rate and rhythm.  Abdomen is soft.  Bowel sounds are somewhat decreased.  There is no guarding or rebound tenderness.  Extremity shows no clubbing, cyanosis or edema. ? ?Again, there is no evidence of malignancy with respect to the GI bleeding.  No biopsies were taken.  We will have to await the biopsies.  Looks like she may have just some erosions. ? ?We will have to see how her hemoglobin trends.  I think if her hemoglobin is holding steady tomorrow, and wound she can probably go home I would think. ? ?Again, we will get her on some folic acid.  She got IV iron yesterday.  I would not put on any oral iron. ? ?I know she is getting wonderful care from the great staff up [facility] [address]. ? ?Lattie Haw, MD ? ?James 1:12 ?

## 2021-09-14 NOTE — TOC Initial Note (Signed)
Transition of Care (TOC) - Initial/Assessment Note  ? ? ?Patient Details  ?Name: Shaleah Nissley ?MRN: [id number] ?Date of Birth: Apr 13, 1966 ? ?Transition of Care [facility]) CM/SW Contact:    ?Leeroy Cha, RN ?Phone Number: ?09/14/2021, 8:19 AM ? ?Clinical Narrative:                 ? ? ?Expected Discharge Plan: Home/Self Care ?Barriers to Discharge: No Barriers Identified ? ?Transition of Care (TOC) Screening Note ? ? ?Patient Details  ?Name: Allanah Mcfarland ?Date of Birth: 01/28/1966 ? ? ?Transition of Care [facility]) CM/SW Contact:    ?Leeroy Cha, RN ?Phone Number: ?09/14/2021, 8:19 AM ? ? ? ?Transition of Care Department [facility]) has reviewed patient and no TOC needs have been identified at this time. We will continue to monitor patient advancement through interdisciplinary progression rounds. If new patient transition needs arise, please place a TOC consult. ? ? ? ?Patient Goals and CMS Choice ?Patient states their goals for this hospitalization and ongoing recovery are:: to go home ?CMS Medicare.gov Compare Post Acute Care list provided to:: Patient ?Choice offered to / list presented to : Patient ? ?Expected Discharge Plan and Services ?Expected Discharge Plan: Home/Self Care ?  ?Discharge Planning Services: CM Consult ?  ?Living arrangements for the past 2 months: [place] ?                ?  ?  ?  ?  ?  ?  ?  ?  ?  ?  ? ?Prior Living Arrangements/Services ?Living arrangements for the past 2 months: [place] ?Lives with:: Self (widowed) ?Patient language and need for interpreter reviewed:: Yes ?Do you feel safe going back to the place where you live?: Yes      ?  ?  ?  ?Criminal Activity/Legal Involvement Pertinent to Current Situation/Hospitalization: No - Comment as needed ? ?Activities of Daily Living ?Home Assistive Devices/Equipment: None ?ADL Screening (condition at time of admission) ?Patient's cognitive ability adequate to safely complete daily activities?: Yes ?Is the patient deaf or have  difficulty hearing?: No ?Does the patient have difficulty seeing, even when wearing glasses/contacts?: No ?Does the patient have difficulty concentrating, remembering, or making decisions?: No ?Patient able to express need for assistance with ADLs?: Yes ?Does the patient have difficulty dressing or bathing?: No ?Independently performs ADLs?: Yes (appropriate for developmental age) ?Does the patient have difficulty walking or climbing stairs?: No ?Weakness of Legs: None ?Weakness of Arms/Hands: None ? ?Permission Sought/Granted ?  ?  ?   ?   ?   ?   ? ?Emotional Assessment ?Appearance:: Appears stated age ?  ?  ?Orientation: : Oriented to Self, Oriented to Place, Oriented to  Time, Oriented to Situation ?Alcohol / Substance Use: Not Applicable ?Psych Involvement: No (comment) ? ?Admission diagnosis:  GI bleed [K92.2] ?Gastrointestinal hemorrhage, unspecified gastrointestinal hemorrhage type [K92.2] ?Gastrointestinal hemorrhage associated with gastritis, unspecified gastritis type [K29.71] ?Acute blood loss anemia [D62] ?Patient Active Problem List  ? Diagnosis Date Noted  ? GI bleed 09/13/2021  ? Acute blood loss anemia 09/13/2021  ? B12 deficiency 06/25/2021  ? Impaired mobility and ADLs 03/27/2020  ? Irregular periods 03/27/2020  ? Menorrhagia 03/27/2020  ? Lumbar compression fracture ([place]) 03/27/2020  ? Constipation   ? Generalized weakness 03/19/2020  ? Nausea & vomiting 03/19/2020  ? Primary malignant neoplasm of breast with metastasis ([place]) 03/19/2020  ? Cancer related pain 03/19/2020  ? Prolonged QT interval 03/19/2020  ? Dysphagia  03/19/2020  ? Status post kyphoplasty 11/12/2019  ? DNR (do not resuscitate) 10/30/2019  ? Metastatic cancer to bone ([place]) 10/30/2019  ? Pathological fracture of vertebra due to neoplastic disease 10/30/2019  ? Encounter for well adult exam with abnormal findings 04/12/2018  ? Deviated septum 01/13/2016  ? Nasal turbinate hypertrophy 12/11/2015  ? Rhinitis, chronic 12/11/2015  ?  Acute sinus infection 07/30/2014  ? LLL pneumonia 06/04/2014  ? Chronic sinusitis 01/25/2012  ? Vertigo 01/25/2012  ? ETD (eustachian tube dysfunction) 10/01/2011  ? HEMATOCHEZIA 05/31/2010  ? ANEMIA-IRON DEFICIENCY 10/11/2007  ? GERD 10/11/2007  ? HLD (hyperlipidemia) 01/21/2007  ? Anxiety state 01/21/2007  ? Depression 01/21/2007  ? Allergic rhinitis 01/21/2007  ? RENAL CALCULUS 01/21/2007  ? BREAST CANCER, HX OF 01/21/2007  ? ?PCP:  Biagio Borg, MD ?Pharmacy:   ?[id number] - [place], [place] - [address] ?[address] ?[place] [address] ?Phone: [phone] Fax: [phone] ? ? ? ? ?Social Determinants of Health (SDOH) Interventions ?  ? ?Readmission Risk Interventions ?   ? View : No data to display.  ?  ?  ?  ? ? ? ?

## 2021-09-14 NOTE — Progress Notes (Addendum)
Patient ID: Caitlin Greene, female   DOB: Jan 29, 1966, 56 y.o.   MRN: [id number] ? ? ? Progress Note ? ? Subjective  ? Day # 2 ? CC; GI bleed, melena and coffee-ground emesis ? ?EGD yesterday-large hiatal hernia with several classic appearing linear Cameron erosions, mild erythema in the gastric antrum ?Biopsies pending ? ?Received IV iron yesterday ? ?WBC 2.5/hemoglobin 7.9/hematocrit 23.7/platelets 125 ? ?Feels okay just very fatigued, no further vomiting or coffee-ground emesis, no bowel movements or melena since admission. ?Tolerating liquids ? ? ? Objective  ? ?Vital signs in last 24 hours: ?Temp:  [97.5 ?F (36.4 ?C)-98.5 ?F (36.9 ?C)] 97.6 ?F (36.4 ?C) (05/09 0536) ?Pulse Rate:  [73-92] 78 (05/09 0536) ?Resp:  [10-20] 20 (05/09 0536) ?BP: (100-186)/(51-91) 100/61 (05/09 0536) ?SpO2:  [95 %-100 %] 95 % (05/09 0536) ?Weight:  [90.7 kg] 90.7 kg (05/08 1333) ?Last BM Date : 09/12/21 ?General:   Older white female in NAD ?Heart:  Regular rate and rhythm; no murmurs ?Lungs: Respirations even and unlabored, lungs CTA bilaterally ?Abdomen:  Soft, nontender and nondistended. Normal bowel sounds. ?Extremities:  Without edema. ?Neurologic:  Alert and oriented,  grossly normal neurologically. ?Psych:  Cooperative. Normal mood and affect. ? ? ?Lab Results: ?Recent Labs  ?  09/13/21 ?0040 09/13/21 ?9233 09/13/21 ?1635 09/14/21 ?0357  ?WBC 3.1* 2.7*  --  2.5*  ?HGB 8.7* 8.3* 9.4* 7.9*  ?HCT 24.8* 23.9* 27.0* 23.7*  ?PLT 134* 136*  --  125*  ? ?BMET ?Recent Labs  ?  09/12/21 ?2215 09/13/21 ?0076  ?NA 140 141  ?K 3.5 3.6  ?CL 110 113*  ?CO2 25 23  ?GLUCOSE 102* 91  ?BUN 25* 15  ?CREATININE 0.89 0.71  ?CALCIUM 8.4* 7.7*  ? ?LFT ?Recent Labs  ?  09/13/21 ?0808  ?PROT 5.5*  ?ALBUMIN 3.1*  ?AST 11*  ?ALT 11  ?ALKPHOS 33*  ?BILITOT 0.5  ? ?PT/INR ?Recent Labs  ?  09/12/21 ?2215  ?LABPROT 13.4  ?INR 1.0  ? ? ?Studies/Results: ?DG Chest 2 View ? ?Result Date: 09/12/2021 ?CLINICAL DATA:  Dyspnea EXAM: CHEST - 2 VIEW COMPARISON:  None  Available. FINDINGS: The heart size and mediastinal contours are within normal limits. Both lungs are clear. Multilevel vertebral augmentation. Hiatal hernia. IMPRESSION: No active cardiopulmonary disease. Electronically Signed   By: Ulyses Jarred M.D.   On: 09/12/2021 22:59   ? ? ? ? Assessment / Plan:   ? ?#6 56 year old white female admitted with coffee-ground emesis and melena. ?Has not required transfusion but hemoglobin 7.9 today ? ?Source of bleeding found on EGD to be large hiatal hernia with Lysbeth Galas erosions/ulcerations in hernia sac.  This is also a cause for chronic GI blood loss and iron deficiency. ? ?Patient is stable and has not had any further active bleeding ? ?#2 anemia acute on chronic ?#3 metastatic breast cancer to the bones ? ?#4 history of chronic GERD ?#5 mild pancytopenia ? ?Plan; we will check hemoglobin again early this afternoon, if further drift consider transfusion prior to discharge, if stable okay to discharge home ?Advance diet as tolerated ?Twice daily PPI chronically at home and had been on omeprazole 40 mg once daily. ?Add Carafate liquid 1 g between meals and bedtime x1 month ?Discussed the entire reflux regimen, n.p.o. for 2 to 3 hours prior to bedtime and elevation of the back 45 degrees for sleep. ? ?Hopefully should be able to be discharged later today, she would like to have follow-up labs done through Dr. Antonieta Pert  office which is closer to her home.  Should have hemoglobin early next week. ? ?GI will be available if needed ? ? LOS: 1 day  ? ?Amy Esterwood PA-C 09/14/2021, 9:01 AM ?  ? ?_______________________________________________________________________________________________________________________________________ ? ?[facility] MD note: ? ?I personally examined the patient, reviewed the data and agree with the assessment and plan described above.  I provided a substantive portion of the care of this patient (personally provided more than half of the total time dedicated  to the treatment of this patient.) Her repeat Hb was 8.8, she is safe for d/c later today. BID PPI for now, carafate QID for one month.  My office will contact her about follow up appt in 6-7 weeks from now. ? ?Please call or page with any further questions or concerns. ? ? ? ?Owens Loffler, MD ?[facility] ?Pager [phone] ? ?

## 2021-09-15 ENCOUNTER — Other Ambulatory Visit: Payer: Self-pay

## 2021-09-15 DIAGNOSIS — K2901 Acute gastritis with bleeding: Secondary | ICD-10-CM

## 2021-09-15 LAB — SURGICAL PATHOLOGY

## 2021-09-15 MED ORDER — SUCRALFATE 1 G PO TABS: 1.0000 g | ORAL_TABLET | Freq: Three times a day (TID) | ORAL | 0 refills | Status: DC

## 2021-09-15 MED ORDER — PANTOPRAZOLE SODIUM 40 MG PO TBEC: 40.0000 mg | DELAYED_RELEASE_TABLET | Freq: Two times a day (BID) | ORAL | 11 refills | Status: DC

## 2021-09-16 ENCOUNTER — Inpatient Hospital Stay: Payer: BC Managed Care – PPO

## 2021-09-16 ENCOUNTER — Telehealth: Payer: Self-pay

## 2021-09-16 ENCOUNTER — Telehealth: Payer: Self-pay | Admitting: *Deleted

## 2021-09-16 DIAGNOSIS — C7951 Secondary malignant neoplasm of bone: Secondary | ICD-10-CM | POA: Diagnosis not present

## 2021-09-16 DIAGNOSIS — Z170 Estrogen receptor positive status [ER+]: Secondary | ICD-10-CM | POA: Diagnosis not present

## 2021-09-16 DIAGNOSIS — K2901 Acute gastritis with bleeding: Secondary | ICD-10-CM

## 2021-09-16 DIAGNOSIS — Z85820 Personal history of malignant melanoma of skin: Secondary | ICD-10-CM | POA: Diagnosis not present

## 2021-09-16 DIAGNOSIS — Z79899 Other long term (current) drug therapy: Secondary | ICD-10-CM | POA: Diagnosis not present

## 2021-09-16 DIAGNOSIS — C50919 Malignant neoplasm of unspecified site of unspecified female breast: Secondary | ICD-10-CM | POA: Diagnosis not present

## 2021-09-16 DIAGNOSIS — Z79818 Long term (current) use of other agents affecting estrogen receptors and estrogen levels: Secondary | ICD-10-CM | POA: Diagnosis not present

## 2021-09-16 LAB — CBC WITH DIFFERENTIAL (CANCER CENTER ONLY)
Abs Immature Granulocytes: 0.02 10*3/uL (ref 0.00–0.07)
Basophils Absolute: 0 10*3/uL (ref 0.0–0.1)
Basophils Relative: 2 %
Eosinophils Absolute: 0 10*3/uL (ref 0.0–0.5)
Eosinophils Relative: 1 %
HCT: 28.9 % — ABNORMAL LOW (ref 36.0–46.0)
Hemoglobin: 9.8 g/dL — ABNORMAL LOW (ref 12.0–15.0)
Immature Granulocytes: 1 %
Lymphocytes Relative: 36 %
Lymphs Abs: 0.8 10*3/uL (ref 0.7–4.0)
MCH: 37 pg — ABNORMAL HIGH (ref 26.0–34.0)
MCHC: 33.9 g/dL (ref 30.0–36.0)
MCV: 109.1 fL — ABNORMAL HIGH (ref 80.0–100.0)
Monocytes Absolute: 0.4 10*3/uL (ref 0.1–1.0)
Monocytes Relative: 17 %
Neutro Abs: 1 10*3/uL — ABNORMAL LOW (ref 1.7–7.7)
Neutrophils Relative %: 43 %
Platelet Count: 170 10*3/uL (ref 150–400)
RBC: 2.65 MIL/uL — ABNORMAL LOW (ref 3.87–5.11)
RDW: 16 % — ABNORMAL HIGH (ref 11.5–15.5)
Smear Review: NORMAL
WBC Count: 2.3 10*3/uL — ABNORMAL LOW (ref 4.0–10.5)
nRBC: 0.9 % — ABNORMAL HIGH (ref 0.0–0.2)

## 2021-09-16 LAB — CMP (CANCER CENTER ONLY)
ALT: 11 U/L (ref 0–44)
AST: 15 U/L (ref 15–41)
Albumin: 4.2 g/dL (ref 3.5–5.0)
Alkaline Phosphatase: 42 U/L (ref 38–126)
Anion gap: 7 (ref 5–15)
BUN: 12 mg/dL (ref 6–20)
CO2: 28 mmol/L (ref 22–32)
Calcium: 9.2 mg/dL (ref 8.9–10.3)
Chloride: 106 mmol/L (ref 98–111)
Creatinine: 0.97 mg/dL (ref 0.44–1.00)
GFR, Estimated: >60 mL/min (ref >60)
Glucose, Bld: 109 mg/dL — ABNORMAL HIGH (ref 70–99)
Potassium: 3.7 mmol/L (ref 3.5–5.1)
Sodium: 141 mmol/L (ref 135–145)
Total Bilirubin: 0.4 mg/dL (ref 0.3–1.2)
Total Protein: 6.4 g/dL — ABNORMAL LOW (ref 6.5–8.1)

## 2021-09-16 NOTE — Telephone Encounter (Signed)
Call received from Almyra Free, Nurse Case Manager with [employer] requesting patient's most recent office note faxed to [phone].  Office note faxed per Julie's request.  ? ?

## 2021-09-16 NOTE — Telephone Encounter (Signed)
Spoke with the patient. Confirmed she has Carafate and Protonix. Explained how to make a slurry with the Carafate tablets. Confirmed her follow up appointment with Dr Ardis Hughs on 10/26/21 at 3:00 pm. Questions invited and answered. Encouraged to call with any concerns or issues. ?

## 2021-09-17 ENCOUNTER — Other Ambulatory Visit: Payer: Self-pay | Admitting: Physician Assistant

## 2021-09-17 ENCOUNTER — Telehealth: Payer: Self-pay | Admitting: *Deleted

## 2021-09-17 NOTE — Telephone Encounter (Signed)
Message received from [place] with [employer] requesting most recent office note be faxed to [phone].  Office note faxed per Julie's  request. ?

## 2021-09-21 ENCOUNTER — Inpatient Hospital Stay: Payer: BC Managed Care – PPO

## 2021-10-08 ENCOUNTER — Inpatient Hospital Stay: Payer: BC Managed Care – PPO

## 2021-10-08 ENCOUNTER — Encounter: Payer: Self-pay | Admitting: Hematology & Oncology

## 2021-10-08 ENCOUNTER — Inpatient Hospital Stay: Payer: BC Managed Care – PPO | Attending: Hematology & Oncology

## 2021-10-08 ENCOUNTER — Inpatient Hospital Stay (HOSPITAL_BASED_OUTPATIENT_CLINIC_OR_DEPARTMENT_OTHER): Payer: BC Managed Care – PPO | Admitting: Hematology & Oncology

## 2021-10-08 VITALS — BP 115/81 | HR 78 | Temp 98.0°F | Resp 16 | Ht 68.0 in | Wt 197.1 lb

## 2021-10-08 DIAGNOSIS — C50919 Malignant neoplasm of unspecified site of unspecified female breast: Secondary | ICD-10-CM

## 2021-10-08 DIAGNOSIS — Z170 Estrogen receptor positive status [ER+]: Secondary | ICD-10-CM | POA: Diagnosis not present

## 2021-10-08 DIAGNOSIS — Z79811 Long term (current) use of aromatase inhibitors: Secondary | ICD-10-CM | POA: Insufficient documentation

## 2021-10-08 DIAGNOSIS — C7951 Secondary malignant neoplasm of bone: Secondary | ICD-10-CM | POA: Diagnosis not present

## 2021-10-08 DIAGNOSIS — Z79899 Other long term (current) drug therapy: Secondary | ICD-10-CM | POA: Insufficient documentation

## 2021-10-08 DIAGNOSIS — K449 Diaphragmatic hernia without obstruction or gangrene: Secondary | ICD-10-CM | POA: Insufficient documentation

## 2021-10-08 DIAGNOSIS — D509 Iron deficiency anemia, unspecified: Secondary | ICD-10-CM | POA: Diagnosis not present

## 2021-10-08 DIAGNOSIS — D500 Iron deficiency anemia secondary to blood loss (chronic): Secondary | ICD-10-CM

## 2021-10-08 LAB — CBC WITH DIFFERENTIAL (CANCER CENTER ONLY)
Abs Immature Granulocytes: 0 10*3/uL (ref 0.00–0.07)
Basophils Absolute: 0 10*3/uL (ref 0.0–0.1)
Basophils Relative: 2 %
Eosinophils Absolute: 0.1 10*3/uL (ref 0.0–0.5)
Eosinophils Relative: 3 %
HCT: 31.4 % — ABNORMAL LOW (ref 36.0–46.0)
Hemoglobin: 10.9 g/dL — ABNORMAL LOW (ref 12.0–15.0)
Immature Granulocytes: 0 %
Lymphocytes Relative: 40 %
Lymphs Abs: 1 10*3/uL (ref 0.7–4.0)
MCH: 35.5 pg — ABNORMAL HIGH (ref 26.0–34.0)
MCHC: 34.7 g/dL (ref 30.0–36.0)
MCV: 102.3 fL — ABNORMAL HIGH (ref 80.0–100.0)
Monocytes Absolute: 0.2 10*3/uL (ref 0.1–1.0)
Monocytes Relative: 6 %
Neutro Abs: 1.2 10*3/uL — ABNORMAL LOW (ref 1.7–7.7)
Neutrophils Relative %: 49 %
Platelet Count: 194 10*3/uL (ref 150–400)
RBC: 3.07 MIL/uL — ABNORMAL LOW (ref 3.87–5.11)
RDW: 13.8 % (ref 11.5–15.5)
Smear Review: NORMAL
WBC Count: 2.4 10*3/uL — ABNORMAL LOW (ref 4.0–10.5)
nRBC: 0 % (ref 0.0–0.2)

## 2021-10-08 LAB — CMP (CANCER CENTER ONLY)
ALT: 9 U/L (ref 0–44)
AST: 10 U/L — ABNORMAL LOW (ref 15–41)
Albumin: 4.1 g/dL (ref 3.5–5.0)
Alkaline Phosphatase: 42 U/L (ref 38–126)
Anion gap: 8 (ref 5–15)
BUN: 15 mg/dL (ref 6–20)
CO2: 26 mmol/L (ref 22–32)
Calcium: 8.8 mg/dL — ABNORMAL LOW (ref 8.9–10.3)
Chloride: 106 mmol/L (ref 98–111)
Creatinine: 0.86 mg/dL (ref 0.44–1.00)
GFR, Estimated: >60 mL/min (ref >60)
Glucose, Bld: 90 mg/dL (ref 70–99)
Potassium: 3.6 mmol/L (ref 3.5–5.1)
Sodium: 140 mmol/L (ref 135–145)
Total Bilirubin: 0.5 mg/dL (ref 0.3–1.2)
Total Protein: 6.5 g/dL (ref 6.5–8.1)

## 2021-10-08 LAB — LACTATE DEHYDROGENASE: LDH: 161 U/L (ref 98–192)

## 2021-10-08 MED ORDER — FULVESTRANT 250 MG/5ML IM SOSY
500.0000 mg | PREFILLED_SYRINGE | INTRAMUSCULAR | Status: DC
  Administered 2021-10-08: 500 mg via INTRAMUSCULAR
  Filled 2021-10-08: qty 10

## 2021-10-08 NOTE — Progress Notes (Signed)
Hematology and Oncology Follow Up Visit  Caitlin Greene [id number] 12-Jul-1965 56 y.o. 10/08/2021   Principle Diagnosis:  Metastatic breast cancer-ER positive/HER-2 negative --bone metastasis only Iron deficiency anemia  Current Therapy:   Faslodex 500 mg IM monthly --start on 04/2020 Ibrance 100 mg p.o. daily (21d on/7d off) - start on 04/2020 Xgeva 120 mg subcu every 3 months -next dose 07//2023 IV iron-Feraheme given on 09/14/2021     Interim History:  Caitlin Greene is in for follow-up.  Surprisingly, since last time we saw her, she was hospitalized.  She was hospitalized but a week after we saw her.  She had some GI bleeding.  I think she may have had some ulcer disease.  I do not know if this was related to the hiatal hernia.  She is doing okay right now.  On occasion, she does have some flareups of epigastric discomfort.  Surprising, more last saw her, she was iron deficient.  Ferritin was only 6.  We did give her a dose of IV iron.  This seemed to help.  Her hemoglobin is clearly better right now.  Thankfully, none of this is from her breast cancer or from her treatments.  Her last CA 27.29 was holding steady at 36.  She does not complain of any pain.  She has some pain issues but does seem to do well with her protocol of pain medication.  She is on OxyContin and oxycodone.  She has had no problems with bowels or bladder.  She has had no rashes.  There is been no leg swelling.  She has had no headache.  There is been no cough.  Overall, I would have to say that her performance status is probably ECOG 1.     Medications:  Current Outpatient Medications:    acetaminophen (TYLENOL) 650 MG CR tablet, Take 650 mg by mouth every 8 (eight) hours as needed for pain., Disp: , Rfl:    CALCIUM PO, Take 1 tablet by mouth daily., Disp: , Rfl:    COVID-19 mRNA bivalent vaccine, Pfizer, (PFIZER COVID-19 VAC BIVALENT) injection, Inject into the muscle., Disp: 0.3 mL, Rfl: 0   famciclovir  (FAMVIR) 250 MG tablet, Take 1 tablet (250 mg total) by mouth daily. (Patient taking differently: Take 250 mg by mouth daily as needed (cold sore).), Disp: 30 tablet, Rfl: 6   FLUoxetine (PROZAC) 20 MG capsule, TAKE 3 CAPSULES(60 MG) BY MOUTH DAILY (Patient taking differently: 60 mg daily.), Disp: 270 capsule, Rfl: 3   fulvestrant (FASLODEX) 250 MG/5ML injection, Inject 500 mg into the muscle every 30 (thirty) days. One injection each buttock over 1-2 minutes. Warm prior to use., Disp: , Rfl:    hydrOXYzine (ATARAX/VISTARIL) 25 MG tablet, Take 25 mg by mouth 3 (three) times daily as needed for anxiety., Disp: , Rfl:    IBRANCE 100 MG tablet, Take 100 mg by mouth See admin instructions. 144m daily on day 1-21 of cycle. 7 days off.  Repeat., Disp: , Rfl:    ondansetron (ZOFRAN) 4 MG tablet, Take 1 tablet (4 mg total) by mouth every 4 (four) hours as needed. (Patient taking differently: Take 4 mg by mouth every 4 (four) hours as needed for nausea.), Disp: 20 tablet, Rfl: 0   oxyCODONE (OXY IR/ROXICODONE) 5 MG immediate release tablet, Take 1 tablet (5 mg total) by mouth every 8 (eight) hours as needed for severe pain., Disp: 50 tablet, Rfl: 0   polyethylene glycol (MIRALAX / GLYCOLAX) 17 g packet, Take 17 g  by mouth daily as needed., Disp: 28 each, Rfl: 0   sucralfate (CARAFATE) 1 g tablet, Take 1 tablet (1 g total) by mouth 4 (four) times daily -  with meals and at bedtime., Disp: 120 tablet, Rfl: 0   vitamin B-12 (CYANOCOBALAMIN) 1000 MCG tablet, Take 1 tablet (1,000 mcg total) by mouth daily., Disp: 30 tablet, Rfl: 1  Allergies:  Allergies  Allergen Reactions   Amoxicillin-Pot Clavulanate Hives and Itching   Penicillins Hives, Itching and Other (See Comments)   Codeine Nausea And Vomiting    Needs pre-meds     Past Medical History, Surgical history, Social history, and Family History were reviewed and updated.  Review of Systems: Review of Systems  Constitutional:  Positive for appetite  change.  HENT:  Negative.    Eyes: Negative.   Respiratory: Negative.    Cardiovascular: Negative.   Gastrointestinal:  Positive for abdominal pain and nausea.  Endocrine: Negative.   Genitourinary: Negative.    Musculoskeletal:  Positive for arthralgias and back pain.  Skin: Negative.   Neurological:  Positive for dizziness.  Hematological: Negative.   Psychiatric/Behavioral: Negative.     Physical Exam:  height is 5' 8"  (1.727 m) and weight is 197 lb 1.9 oz (89.4 kg). Her oral temperature is 98 F (36.7 C). Her blood pressure is 115/81 and her pulse is 78. Her respiration is 16 and oxygen saturation is 97%.   Wt Readings from Last 3 Encounters:  10/08/21 197 lb 1.9 oz (89.4 kg)  09/13/21 200 lb (90.7 kg)  09/07/21 200 lb 4 oz (90.8 kg)    Physical Exam Vitals reviewed.  HENT:     Head: Normocephalic and atraumatic.  Eyes:     Pupils: Pupils are equal, round, and reactive to light.  Cardiovascular:     Rate and Rhythm: Normal rate and regular rhythm.     Heart sounds: Normal heart sounds.  Pulmonary:     Effort: Pulmonary effort is normal.     Breath sounds: Normal breath sounds.  Abdominal:     General: Bowel sounds are normal.     Palpations: Abdomen is soft.  Musculoskeletal:        General: No tenderness or deformity. Normal range of motion.     Cervical back: Normal range of motion.  Lymphadenopathy:     Cervical: No cervical adenopathy.  Skin:    General: Skin is warm and dry.     Findings: No erythema or rash.  Neurological:     Mental Status: She is alert and oriented to person, place, and time.  Psychiatric:        Behavior: Behavior normal.        Thought Content: Thought content normal.        Judgment: Judgment normal.     Lab Results  Component Value Date   WBC 2.4 (L) 10/08/2021   HGB 10.9 (L) 10/08/2021   HCT 31.4 (L) 10/08/2021   MCV 102.3 (H) 10/08/2021   PLT 194 10/08/2021     Chemistry      Component Value Date/Time   NA 140  10/08/2021 1206   K 3.6 10/08/2021 1206   CL 106 10/08/2021 1206   CO2 26 10/08/2021 1206   BUN 15 10/08/2021 1206   CREATININE 0.86 10/08/2021 1206   CREATININE 0.88 10/22/2019 1246      Component Value Date/Time   CALCIUM 8.8 (L) 10/08/2021 1206   ALKPHOS 42 10/08/2021 1206   AST 10 (L) 10/08/2021 1206  ALT 9 10/08/2021 1206   BILITOT 0.5 10/08/2021 1206      Impression and Plan: Ms. Deramo is a very charming 56 year old postmenopausal female with metastatic breast cancer.  We actually had seen her many years ago with a carcinoma in situ.  She is the wife of one of our former patients who passed away from ocular melanoma.  I hate the fact that she had this episode of GI bleeding.  As always, gastroenterology has been on top of this and it really helped [place] out.  As far as her breast cancer is concerned, I think everything is going pretty well with this.  Again I do not think we need any scans until July.  She will get her Faslodex today.  We will have to monitor her iron levels.  I will see what they are next time she is in.  I will plan to get her back in 1 more month.   [name], MD 6/2/202312:51 PM

## 2021-10-08 NOTE — Patient Instructions (Signed)
Fulvestrant injection What is this medication? FULVESTRANT (ful VES trant) blocks the effects of estrogen. It is used to treat breast cancer. This medicine may be used for other purposes; ask your health care provider or pharmacist if you have questions. COMMON BRAND NAME(S): FASLODEX What should I tell my care team before I take this medication? They need to know if you have any of these conditions: bleeding disorders liver disease low blood counts, like low white cell, platelet, or red cell counts an unusual or allergic reaction to fulvestrant, other medicines, foods, dyes, or preservatives pregnant or trying to get pregnant breast-feeding How should I use this medication? This medicine is for injection into a muscle. It is usually given by a health care professional in a hospital or clinic setting. Talk to your pediatrician regarding the use of this medicine in children. Special care may be needed. Overdosage: If you think you have taken too much of this medicine contact a poison control center or emergency room at once. NOTE: This medicine is only for you. Do not share this medicine with others. What if I miss a dose? It is important not to miss your dose. Call your doctor or health care professional if you are unable to keep an appointment. What may interact with this medication? medicines that treat or prevent blood clots like warfarin, enoxaparin, dalteparin, apixaban, dabigatran, and rivaroxaban This list may not describe all possible interactions. Give your health care provider a list of all the medicines, herbs, non-prescription drugs, or dietary supplements you use. Also tell them if you smoke, drink alcohol, or use illegal drugs. Some items may interact with your medicine. What should I watch for while using this medication? Your condition will be monitored carefully while you are receiving this medicine. You will need important blood work done while you are taking this  medicine. Do not become pregnant while taking this medicine or for at least 1 year after stopping it. Women of child-bearing potential will need to have a negative pregnancy test before starting this medicine. Women should inform their doctor if they wish to become pregnant or think they might be pregnant. There is a potential for serious side effects to an unborn child. Men should inform their doctors if they wish to father a child. This medicine may lower sperm counts. Talk to your health care professional or pharmacist for more information. Do not breast-feed an infant while taking this medicine or for 1 year after the last dose. What side effects may I notice from receiving this medication? Side effects that you should report to your doctor or health care professional as soon as possible: allergic reactions like skin rash, itching or hives, swelling of the face, lips, or tongue feeling faint or lightheaded, falls pain, tingling, numbness, or weakness in the legs signs and symptoms of infection like fever or chills; cough; flu-like symptoms; sore throat vaginal bleeding Side effects that usually do not require medical attention (report to your doctor or health care professional if they continue or are bothersome): aches, pains constipation diarrhea headache hot flashes nausea, vomiting pain at site where injected stomach pain This list may not describe all possible side effects. Call your doctor for medical advice about side effects. You may report side effects to FDA at 1-800-FDA-1088. Where should I keep my medication? This drug is given in a hospital or clinic and will not be stored at home. NOTE: This sheet is a summary. It may not cover all possible information. If you have   questions about this medicine, talk to your doctor, pharmacist, or health care provider.  2023 Elsevier/Gold Standard (2017-08-08 00:00:00)  

## 2021-10-09 LAB — CANCER ANTIGEN 27.29: CA 27.29: 44.3 U/mL — ABNORMAL HIGH (ref 0.0–38.6)

## 2021-10-15 ENCOUNTER — Inpatient Hospital Stay (HOSPITAL_BASED_OUTPATIENT_CLINIC_OR_DEPARTMENT_OTHER)
Admission: EM | Admit: 2021-10-15 | Discharge: 2021-10-21 | DRG: 393 | Disposition: A | Discharge status: Home / Self Care | Payer: BC Managed Care – PPO | Attending: Internal Medicine | Admitting: Internal Medicine

## 2021-10-15 ENCOUNTER — Encounter (HOSPITAL_BASED_OUTPATIENT_CLINIC_OR_DEPARTMENT_OTHER): Payer: Self-pay | Admitting: Emergency Medicine

## 2021-10-15 ENCOUNTER — Other Ambulatory Visit: Payer: Self-pay

## 2021-10-15 ENCOUNTER — Telehealth: Payer: Self-pay | Admitting: *Deleted

## 2021-10-15 DIAGNOSIS — D6959 Other secondary thrombocytopenia: Secondary | ICD-10-CM | POA: Diagnosis not present

## 2021-10-15 DIAGNOSIS — Z9011 Acquired absence of right breast and nipple: Secondary | ICD-10-CM

## 2021-10-15 DIAGNOSIS — E785 Hyperlipidemia, unspecified: Secondary | ICD-10-CM | POA: Diagnosis present

## 2021-10-15 DIAGNOSIS — J9601 Acute respiratory failure with hypoxia: Secondary | ICD-10-CM | POA: Diagnosis not present

## 2021-10-15 DIAGNOSIS — Z1501 Genetic susceptibility to malignant neoplasm of breast: Secondary | ICD-10-CM | POA: Diagnosis not present

## 2021-10-15 DIAGNOSIS — D696 Thrombocytopenia, unspecified: Secondary | ICD-10-CM

## 2021-10-15 DIAGNOSIS — K633 Ulcer of intestine: Secondary | ICD-10-CM | POA: Diagnosis not present

## 2021-10-15 DIAGNOSIS — R55 Syncope and collapse: Secondary | ICD-10-CM | POA: Diagnosis present

## 2021-10-15 DIAGNOSIS — F32A Depression, unspecified: Secondary | ICD-10-CM | POA: Diagnosis not present

## 2021-10-15 DIAGNOSIS — K6389 Other specified diseases of intestine: Secondary | ICD-10-CM | POA: Diagnosis not present

## 2021-10-15 DIAGNOSIS — E538 Deficiency of other specified B group vitamins: Secondary | ICD-10-CM | POA: Diagnosis present

## 2021-10-15 DIAGNOSIS — K449 Diaphragmatic hernia without obstruction or gangrene: Secondary | ICD-10-CM | POA: Diagnosis not present

## 2021-10-15 DIAGNOSIS — K222 Esophageal obstruction: Secondary | ICD-10-CM | POA: Diagnosis present

## 2021-10-15 DIAGNOSIS — T454X5A Adverse effect of iron and its compounds, initial encounter: Secondary | ICD-10-CM | POA: Diagnosis not present

## 2021-10-15 DIAGNOSIS — D62 Acute posthemorrhagic anemia: Secondary | ICD-10-CM

## 2021-10-15 DIAGNOSIS — K2100 Gastro-esophageal reflux disease with esophagitis, without bleeding: Secondary | ICD-10-CM | POA: Diagnosis not present

## 2021-10-15 DIAGNOSIS — K254 Chronic or unspecified gastric ulcer with hemorrhage: Secondary | ICD-10-CM | POA: Diagnosis not present

## 2021-10-15 DIAGNOSIS — Y848 Other medical procedures as the cause of abnormal reaction of the patient, or of later complication, without mention of misadventure at the time of the procedure: Secondary | ICD-10-CM | POA: Diagnosis not present

## 2021-10-15 DIAGNOSIS — K5730 Diverticulosis of large intestine without perforation or abscess without bleeding: Secondary | ICD-10-CM | POA: Diagnosis present

## 2021-10-15 DIAGNOSIS — K284 Chronic or unspecified gastrojejunal ulcer with hemorrhage: Secondary | ICD-10-CM

## 2021-10-15 DIAGNOSIS — C50919 Malignant neoplasm of unspecified site of unspecified female breast: Secondary | ICD-10-CM | POA: Diagnosis not present

## 2021-10-15 DIAGNOSIS — T886XXA Anaphylactic reaction due to adverse effect of correct drug or medicament properly administered, initial encounter: Secondary | ICD-10-CM | POA: Diagnosis not present

## 2021-10-15 DIAGNOSIS — D519 Vitamin B12 deficiency anemia, unspecified: Secondary | ICD-10-CM

## 2021-10-15 DIAGNOSIS — K635 Polyp of colon: Secondary | ICD-10-CM | POA: Diagnosis present

## 2021-10-15 DIAGNOSIS — K2101 Gastro-esophageal reflux disease with esophagitis, with bleeding: Secondary | ICD-10-CM | POA: Diagnosis present

## 2021-10-15 DIAGNOSIS — I959 Hypotension, unspecified: Secondary | ICD-10-CM

## 2021-10-15 DIAGNOSIS — E872 Acidosis, unspecified: Secondary | ICD-10-CM

## 2021-10-15 DIAGNOSIS — E038 Other specified hypothyroidism: Secondary | ICD-10-CM | POA: Diagnosis not present

## 2021-10-15 DIAGNOSIS — K259 Gastric ulcer, unspecified as acute or chronic, without hemorrhage or perforation: Secondary | ICD-10-CM | POA: Diagnosis not present

## 2021-10-15 DIAGNOSIS — Q399 Congenital malformation of esophagus, unspecified: Secondary | ICD-10-CM

## 2021-10-15 DIAGNOSIS — D72819 Decreased white blood cell count, unspecified: Secondary | ICD-10-CM | POA: Diagnosis present

## 2021-10-15 DIAGNOSIS — C7951 Secondary malignant neoplasm of bone: Secondary | ICD-10-CM | POA: Diagnosis present

## 2021-10-15 DIAGNOSIS — Z888 Allergy status to other drugs, medicaments and biological substances status: Secondary | ICD-10-CM

## 2021-10-15 DIAGNOSIS — E876 Hypokalemia: Secondary | ICD-10-CM | POA: Diagnosis present

## 2021-10-15 DIAGNOSIS — D500 Iron deficiency anemia secondary to blood loss (chronic): Secondary | ICD-10-CM | POA: Diagnosis not present

## 2021-10-15 DIAGNOSIS — K921 Melena: Secondary | ICD-10-CM | POA: Diagnosis not present

## 2021-10-15 DIAGNOSIS — K922 Gastrointestinal hemorrhage, unspecified: Secondary | ICD-10-CM | POA: Diagnosis not present

## 2021-10-15 DIAGNOSIS — R0902 Hypoxemia: Secondary | ICD-10-CM | POA: Diagnosis not present

## 2021-10-15 DIAGNOSIS — Z885 Allergy status to narcotic agent status: Secondary | ICD-10-CM

## 2021-10-15 DIAGNOSIS — K559 Vascular disorder of intestine, unspecified: Principal | ICD-10-CM

## 2021-10-15 DIAGNOSIS — J9811 Atelectasis: Secondary | ICD-10-CM | POA: Diagnosis not present

## 2021-10-15 DIAGNOSIS — K219 Gastro-esophageal reflux disease without esophagitis: Secondary | ICD-10-CM

## 2021-10-15 DIAGNOSIS — D120 Benign neoplasm of cecum: Secondary | ICD-10-CM | POA: Diagnosis not present

## 2021-10-15 DIAGNOSIS — Z880 Allergy status to penicillin: Secondary | ICD-10-CM

## 2021-10-15 DIAGNOSIS — K257 Chronic gastric ulcer without hemorrhage or perforation: Secondary | ICD-10-CM | POA: Diagnosis not present

## 2021-10-15 DIAGNOSIS — Z79899 Other long term (current) drug therapy: Secondary | ICD-10-CM

## 2021-10-15 LAB — CBC WITH DIFFERENTIAL/PLATELET
Abs Immature Granulocytes: 0 10*3/uL (ref 0.00–0.07)
Abs Immature Granulocytes: 0 10*3/uL (ref 0.00–0.07)
Basophils Absolute: 0 10*3/uL (ref 0.0–0.1)
Basophils Absolute: 0 10*3/uL (ref 0.0–0.1)
Basophils Relative: 1 %
Basophils Relative: 0 %
Eosinophils Absolute: 0 10*3/uL (ref 0.0–0.5)
Eosinophils Absolute: 0.1 10*3/uL (ref 0.0–0.5)
Eosinophils Relative: 1 %
Eosinophils Relative: 2 %
HCT: 23.7 % — ABNORMAL LOW (ref 36.0–46.0)
HCT: 22.5 % — ABNORMAL LOW (ref 36.0–46.0)
Hemoglobin: 8.2 g/dL — ABNORMAL LOW (ref 12.0–15.0)
Hemoglobin: 7.6 g/dL — ABNORMAL LOW (ref 12.0–15.0)
Immature Granulocytes: 0 %
Lymphocytes Relative: 33 %
Lymphocytes Relative: 41 %
Lymphs Abs: 0.7 10*3/uL (ref 0.7–4.0)
Lymphs Abs: 1.1 10*3/uL (ref 0.7–4.0)
MCH: 36.4 pg — ABNORMAL HIGH (ref 26.0–34.0)
MCH: 36.7 pg — ABNORMAL HIGH (ref 26.0–34.0)
MCHC: 34.6 g/dL (ref 30.0–36.0)
MCHC: 33.8 g/dL (ref 30.0–36.0)
MCV: 105.3 fL — ABNORMAL HIGH (ref 80.0–100.0)
MCV: 108.7 fL — ABNORMAL HIGH (ref 80.0–100.0)
Monocytes Absolute: 0.1 10*3/uL (ref 0.1–1.0)
Monocytes Absolute: 0.1 10*3/uL (ref 0.1–1.0)
Monocytes Relative: 4 %
Monocytes Relative: 4 %
Neutro Abs: 1.3 10*3/uL — ABNORMAL LOW (ref 1.7–7.7)
Neutro Abs: 1.4 10*3/uL — ABNORMAL LOW (ref 1.7–7.7)
Neutrophils Relative %: 61 %
Neutrophils Relative %: 53 %
Platelets: 139 10*3/uL — ABNORMAL LOW (ref 150–400)
Platelets: 144 10*3/uL — ABNORMAL LOW (ref 150–400)
RBC: 2.25 MIL/uL — ABNORMAL LOW (ref 3.87–5.11)
RBC: 2.07 MIL/uL — ABNORMAL LOW (ref 3.87–5.11)
RDW: 15.4 % (ref 11.5–15.5)
RDW: 15.2 % (ref 11.5–15.5)
Smear Review: NORMAL
WBC: 2.1 10*3/uL — ABNORMAL LOW (ref 4.0–10.5)
WBC: 2.7 10*3/uL — ABNORMAL LOW (ref 4.0–10.5)
nRBC: 0 % (ref 0.0–0.2)
nRBC: 0 % (ref 0.0–0.2)

## 2021-10-15 LAB — COMPREHENSIVE METABOLIC PANEL
ALT: 10 U/L (ref 0–44)
ALT: 11 U/L (ref 0–44)
AST: 12 U/L — ABNORMAL LOW (ref 15–41)
AST: 13 U/L — ABNORMAL LOW (ref 15–41)
Albumin: 3.5 g/dL (ref 3.5–5.0)
Albumin: 3.2 g/dL — ABNORMAL LOW (ref 3.5–5.0)
Alkaline Phosphatase: 37 U/L — ABNORMAL LOW (ref 38–126)
Alkaline Phosphatase: 30 U/L — ABNORMAL LOW (ref 38–126)
Anion gap: 4 — ABNORMAL LOW (ref 5–15)
Anion gap: 5 (ref 5–15)
BUN: 12 mg/dL (ref 6–20)
BUN: 10 mg/dL (ref 6–20)
CO2: 25 mmol/L (ref 22–32)
CO2: 24 mmol/L (ref 22–32)
Calcium: 8.4 mg/dL — ABNORMAL LOW (ref 8.9–10.3)
Calcium: 8.1 mg/dL — ABNORMAL LOW (ref 8.9–10.3)
Chloride: 109 mmol/L (ref 98–111)
Chloride: 111 mmol/L (ref 98–111)
Creatinine, Ser: 0.88 mg/dL (ref 0.44–1.00)
Creatinine, Ser: 0.83 mg/dL (ref 0.44–1.00)
GFR, Estimated: >60 mL/min (ref >60)
GFR, Estimated: >60 mL/min (ref >60)
Glucose, Bld: 90 mg/dL (ref 70–99)
Glucose, Bld: 85 mg/dL (ref 70–99)
Potassium: 3 mmol/L — ABNORMAL LOW (ref 3.5–5.1)
Potassium: 2.9 mmol/L — ABNORMAL LOW (ref 3.5–5.1)
Sodium: 138 mmol/L (ref 135–145)
Sodium: 140 mmol/L (ref 135–145)
Total Bilirubin: 0.6 mg/dL (ref 0.3–1.2)
Total Bilirubin: 0.7 mg/dL (ref 0.3–1.2)
Total Protein: 6.3 g/dL — ABNORMAL LOW (ref 6.5–8.1)
Total Protein: 5.7 g/dL — ABNORMAL LOW (ref 6.5–8.1)

## 2021-10-15 LAB — PROTIME-INR
INR: 1.1 (ref 0.8–1.2)
Prothrombin Time: 14.2 seconds (ref 11.4–15.2)

## 2021-10-15 LAB — RETICULOCYTES
Immature Retic Fract: 31.7 % — ABNORMAL HIGH (ref 2.3–15.9)
RBC.: 2.07 MIL/uL — ABNORMAL LOW (ref 3.87–5.11)
Retic Count, Absolute: 60.2 10*3/uL (ref 19.0–186.0)
Retic Ct Pct: 2.9 % (ref 0.4–3.1)

## 2021-10-15 LAB — TSH: TSH: 4.846 u[IU]/mL — ABNORMAL HIGH (ref 0.350–4.500)

## 2021-10-15 LAB — LIPASE, BLOOD: Lipase: 24 U/L (ref 11–51)

## 2021-10-15 LAB — PREPARE RBC (CROSSMATCH)

## 2021-10-15 LAB — MAGNESIUM: Magnesium: 1.9 mg/dL (ref 1.7–2.4)

## 2021-10-15 MED ORDER — PANTOPRAZOLE SODIUM 40 MG IV SOLR
40.0000 mg | Freq: Once | INTRAVENOUS | Status: AC
  Administered 2021-10-15: 40 mg via INTRAVENOUS
  Filled 2021-10-15: qty 10

## 2021-10-15 MED ORDER — SODIUM CHLORIDE 0.9% IV SOLUTION: Freq: Once | INTRAVENOUS | Status: DC

## 2021-10-15 MED ORDER — ACETAMINOPHEN 325 MG PO TABS: 650.0000 mg | ORAL_TABLET | Freq: Four times a day (QID) | ORAL | Status: DC | PRN

## 2021-10-15 MED ORDER — ACETAMINOPHEN 650 MG RE SUPP: 650.0000 mg | Freq: Four times a day (QID) | RECTAL | Status: DC | PRN

## 2021-10-15 MED ORDER — SUCRALFATE 1 G PO TABS
1.0000 g | ORAL_TABLET | Freq: Three times a day (TID) | ORAL | Status: DC
  Administered 2021-10-16 – 2021-10-21 (×19): 1 g via ORAL
  Filled 2021-10-15 (×20): qty 1

## 2021-10-15 MED ORDER — PANTOPRAZOLE SODIUM 40 MG IV SOLR
40.0000 mg | Freq: Two times a day (BID) | INTRAVENOUS | Status: DC
  Administered 2021-10-15 – 2021-10-17 (×4): 40 mg via INTRAVENOUS
  Filled 2021-10-15 (×4): qty 10

## 2021-10-15 MED ORDER — OXYCODONE HCL 5 MG PO TABS: 5.0000 mg | ORAL_TABLET | Freq: Three times a day (TID) | ORAL | Status: DC | PRN

## 2021-10-15 MED ORDER — HYDROXYZINE HCL 25 MG PO TABS
25.0000 mg | ORAL_TABLET | Freq: Three times a day (TID) | ORAL | Status: DC | PRN
  Administered 2021-10-16: 25 mg via ORAL
  Filled 2021-10-15: qty 1

## 2021-10-15 MED ORDER — SODIUM CHLORIDE 0.9 % IV SOLN: INTRAVENOUS | Status: DC

## 2021-10-15 MED ORDER — MORPHINE SULFATE (PF) 4 MG/ML IV SOLN
4.0000 mg | Freq: Once | INTRAVENOUS | Status: DC
  Filled 2021-10-15: qty 1

## 2021-10-15 MED ORDER — SODIUM CHLORIDE 0.9 % IV BOLUS
1000.0000 mL | Freq: Once | INTRAVENOUS | Status: AC
  Administered 2021-10-15: 1000 mL via INTRAVENOUS

## 2021-10-15 MED ORDER — ONDANSETRON HCL 4 MG/2ML IJ SOLN
4.0000 mg | Freq: Once | INTRAMUSCULAR | Status: AC
  Administered 2021-10-15: 4 mg via INTRAVENOUS
  Filled 2021-10-15: qty 2

## 2021-10-15 MED ORDER — ACETAMINOPHEN ER 650 MG PO TBCR: 650.0000 mg | EXTENDED_RELEASE_TABLET | Freq: Three times a day (TID) | ORAL | Status: DC | PRN

## 2021-10-15 MED ORDER — VITAMIN B-12 1000 MCG PO TABS
1000.0000 ug | ORAL_TABLET | Freq: Every day | ORAL | Status: DC
  Administered 2021-10-16 – 2021-10-21 (×6): 1000 ug via ORAL
  Filled 2021-10-15 (×6): qty 1

## 2021-10-15 MED ORDER — FLUOXETINE HCL 20 MG PO CAPS
60.0000 mg | ORAL_CAPSULE | Freq: Every day | ORAL | Status: DC
  Administered 2021-10-16 – 2021-10-21 (×6): 60 mg via ORAL
  Filled 2021-10-15 (×6): qty 3

## 2021-10-15 MED ORDER — ALBUTEROL SULFATE (2.5 MG/3ML) 0.083% IN NEBU: 2.5000 mg | INHALATION_SOLUTION | RESPIRATORY_TRACT | Status: DC | PRN

## 2021-10-15 MED ORDER — PALBOCICLIB 100 MG PO TABS: 100.0000 mg | ORAL_TABLET | ORAL | Status: DC

## 2021-10-15 NOTE — ED Triage Notes (Signed)
Abdominal pain since yesterday, some nausea, noted dark coffee ground color stool yesterday

## 2021-10-15 NOTE — Progress Notes (Signed)
Plan of Care Note for accepted transfer   Patient: Caitlin Greene MRN: [id number]   Leslie: 10/15/2021  Facility requesting transfer: [facility] Requesting Provider: [place] Reason for transfer: GI bleed Facility course:   Blood pressure 117/75, pulse 81, temperature 98.5 F (36.9 C), temperature source Oral, resp. rate 20, height '5\' 8"'$  (1.727 m), weight 89.4 kg, SpO2 99 %.  H/o recent hospitalization a month ago for GI bleed s/p  EGD 5/8. Findings of hiatal hernia with linear erosions. GI rec to continue BID PPI with one month of carafate, presents with Abdominal pain since yesterday, some nausea, noted dark coffee ground color stool yesterday Hemoglobin 8.2  Received ppi in the ED Hemodynamically stable Per EDP EDP talked to [facility] Dr Rush Landmark who is aware patient ,recommend clear liquid diet for now, please call GI once patient arrives   Plan of care: The patient is accepted for admission to Telemetry unit, at [facility]..    Author: Florencia Reasons, MD PhD FACP 10/15/2021  Check [email or web address] for on-call coverage.  Nursing staff, Please call [facility] number on Amion as soon as patient's arrival, so appropriate admitting provider can evaluate the pt.

## 2021-10-15 NOTE — ED Provider Notes (Signed)
[facility] EMERGENCY DEPARTMENT Provider Note   CSN: [id number] Arrival date & time: 10/15/21  1300     History  Chief Complaint  Patient presents with   Abdominal Pain    Caitlin Greene is a 56 y.o. female.  56 yo F with a chief complaints of epigastric discomfort and dark and tarry stools.  The patient was just admitted to the hospital for a upper GI bleed and she is concerned that it has reoccurred.  Denies any blood thinner use denies any NSAIDs.  Denies Goody powders.  No fevers or chills.  Mild epigastric discomfort.  No nausea or vomiting.  Has had a couple dark and tarry stools this morning.   Abdominal Pain      Home Medications Prior to Admission medications   Medication Sig Start Date End Date Taking? Authorizing Provider  acetaminophen (TYLENOL) 650 MG CR tablet Take 650 mg by mouth every 8 (eight) hours as needed for pain.    [provider]  CALCIUM PO Take 1 tablet by mouth daily.    [provider]  COVID-19 mRNA bivalent vaccine, Pfizer, (PFIZER COVID-19 VAC BIVALENT) injection Inject into the muscle. 03/15/21   Carlyle Basques, MD  famciclovir (FAMVIR) 250 MG tablet Take 1 tablet (250 mg total) by mouth daily. Patient taking differently: Take 250 mg by mouth daily as needed (cold sore). 04/07/20   [name], MD  FLUoxetine (PROZAC) 20 MG capsule TAKE 3 CAPSULES(60 MG) BY MOUTH DAILY Patient taking differently: 60 mg daily. 06/25/21   Biagio Borg, MD  fulvestrant (FASLODEX) 250 MG/5ML injection Inject 500 mg into the muscle every 30 (thirty) days. One injection each buttock over 1-2 minutes. Warm prior to use.    [provider]  hydrOXYzine (ATARAX/VISTARIL) 25 MG tablet Take 25 mg by mouth 3 (three) times daily as needed for anxiety. 12/31/19   [provider]  IBRANCE 100 MG tablet Take 100 mg by mouth See admin instructions. '100mg'$  daily on day 1-21 of cycle. 7 days off.  Repeat. 03/13/20   [provider]  ondansetron (ZOFRAN) 4 MG tablet Take 1 tablet (4 mg total) by mouth every 4 (four) hours as needed. Patient taking differently: Take 4 mg by mouth every 4 (four) hours as needed for nausea. 03/24/20   Shelly Coss, MD  oxyCODONE (OXY IR/ROXICODONE) 5 MG immediate release tablet Take 1 tablet (5 mg total) by mouth every 8 (eight) hours as needed for severe pain. 11/06/20   [name], MD  polyethylene glycol (MIRALAX / GLYCOLAX) 17 g packet Take 17 g by mouth daily as needed. 03/24/20   Shelly Coss, MD  sucralfate (CARAFATE) 1 g tablet Take 1 tablet (1 g total) by mouth 4 (four) times daily -  with meals and at bedtime. 09/15/21 10/15/21  Esterwood, Amy S, PA-C  vitamin B-12 (CYANOCOBALAMIN) 1000 MCG tablet Take 1 tablet (1,000 mcg total) by mouth daily. 03/24/20   Shelly Coss, MD      Allergies    Amoxicillin-pot clavulanate, Penicillins, and Codeine    Review of Systems   Review of Systems  Gastrointestinal:  Positive for abdominal pain.    Physical Exam Updated Vital Signs BP 117/75   Pulse 81   Temp 98.5 F (36.9 C) (Oral)   Resp 20   Ht '5\' 8"'$  (1.727 m)   Wt 89.4 kg   LMP 06/09/2017 (Exact Date)   SpO2 99%   BMI 29.95 kg/m  Physical Exam Vitals  and nursing note reviewed.  Constitutional:      General: She is not in acute distress.    Appearance: She is well-developed. She is not diaphoretic.  HENT:     Head: Normocephalic and atraumatic.  Eyes:     Pupils: Pupils are equal, round, and reactive to light.  Cardiovascular:     Rate and Rhythm: Regular rhythm. Tachycardia present.     Heart sounds: No murmur heard.    No friction rub. No gallop.  Pulmonary:     Effort: Pulmonary effort is normal.     Breath sounds: No wheezing or rales.  Abdominal:     General: There is no distension.     Palpations: Abdomen is soft.     Tenderness: There is no abdominal tenderness.     Comments: Mild diffuse  Musculoskeletal:        General: No  tenderness.     Cervical back: Normal range of motion and neck supple.  Skin:    General: Skin is warm and dry.  Neurological:     Mental Status: She is alert and oriented to person, place, and time.  Psychiatric:        Behavior: Behavior normal.     ED Results / Procedures / Treatments   Labs (all labs ordered are listed, but only abnormal results are displayed) Labs Reviewed  CBC WITH DIFFERENTIAL/PLATELET - Abnormal; Notable for the following components:      Result Value   WBC 2.1 (*)    RBC 2.25 (*)    Hemoglobin 8.2 (*)    HCT 23.7 (*)    MCV 105.3 (*)    MCH 36.4 (*)    Platelets 139 (*)    Neutro Abs 1.3 (*)    All other components within normal limits  COMPREHENSIVE METABOLIC PANEL - Abnormal; Notable for the following components:   Potassium 3.0 (*)    Calcium 8.4 (*)    Total Protein 6.3 (*)    AST 12 (*)    Alkaline Phosphatase 37 (*)    Anion gap 4 (*)    All other components within normal limits  LIPASE, BLOOD    EKG None  Radiology No results found.  Procedures Procedures    Medications Ordered in ED Medications  morphine (PF) 4 MG/ML injection 4 mg (4 mg Intravenous Patient Refused/Not Given 10/15/21 1359)  sodium chloride 0.9 % bolus 1,000 mL (1,000 mLs Intravenous New Bag/Given 10/15/21 1358)  ondansetron (ZOFRAN) injection 4 mg (4 mg Intravenous Given 10/15/21 1354)  pantoprazole (PROTONIX) injection 40 mg (40 mg Intravenous Given 10/15/21 1446)    ED Course/ Medical Decision Making/ A&P                           Medical Decision Making Amount and/or Complexity of Data Reviewed Labs: ordered.  Risk Prescription drug management.   56 yo F with a chief complaints of concern for an upper GI bleed.  The patient has a history of this and occurred just recently.  Was in the hospital.  She sees oncology for her metastatic breast cancer.  Denies any blood thinner use denies NSAIDs.  Patient's hemoglobin is down 3 g from last check.  No  significant electrolyte abnormality.  BUN is normal.  I discussed the case with Dr. Rush Landmark, gastroenterology.  Agreed with Protonix recommended clear liquid diet and they would see in consult.  We will discuss with medicine.  CRITICAL CARE Performed  by: Cecilio Asper   Total critical care time: 35 minutes  Critical care time was exclusive of separately billable procedures and treating other patients.  Critical care was necessary to treat or prevent imminent or life-threatening deterioration.  Critical care was time spent personally by me on the following activities: development of treatment plan with patient and/or surrogate as well as nursing, discussions with consultants, evaluation of patient's response to treatment, examination of patient, obtaining history from patient or surrogate, ordering and performing treatments and interventions, ordering and review of laboratory studies, ordering and review of radiographic studies, pulse oximetry and re-evaluation of patient's condition.  The patients results and plan were reviewed and discussed.   Any x-rays performed were independently reviewed by myself.   Differential diagnosis were considered with the presenting HPI.  Medications  morphine (PF) 4 MG/ML injection 4 mg (4 mg Intravenous Patient Refused/Not Given 10/15/21 1359)  sodium chloride 0.9 % bolus 1,000 mL (1,000 mLs Intravenous New Bag/Given 10/15/21 1358)  ondansetron (ZOFRAN) injection 4 mg (4 mg Intravenous Given 10/15/21 1354)  pantoprazole (PROTONIX) injection 40 mg (40 mg Intravenous Given 10/15/21 1446)    Vitals:   10/15/21 1319 10/15/21 1319 10/15/21 1435  BP:  (!) 121/93 117/75  Pulse:  (!) 102 81  Resp:  20 20  Temp:  98.5 F (36.9 C)   TempSrc:  Oral   SpO2:  99% 99%  Weight: 89.4 kg    Height: '5\' 8"'$  (1.727 m)      Final diagnoses:  Upper GI bleed    Admission/ observation were discussed with the admitting physician, patient and/or family and they  are comfortable with the plan.         Final Clinical Impression(s) / ED Diagnoses Final diagnoses:  Upper GI bleed    Rx / DC Orders ED Discharge Orders     None         Deno Etienne, DO 10/15/21 1527

## 2021-10-15 NOTE — ED Notes (Signed)
Warm blankets  

## 2021-10-15 NOTE — Progress Notes (Signed)
PHARMACY - PALBOCICLIB (IBRANCE)  PTA patient on Ibrance '100mg'$  daily x 21 days and then off x 7 days.   During med history interview, patient reports today is day 1 of her "off" cycle x 7 days.  Order for Ibrance '100mg'$  daily which was ordered on admission has been d/c'ed.    Will follow for appropriateness of resuming Ibrance on next "on" cycle in 7 days,if patient still in hospital.  Leone Haven, PharmD

## 2021-10-15 NOTE — H&P (Signed)
History and Physical    Caitlin Greene NOT:[id number] DOB: 1965/12/19 DOA: 10/15/2021  PCP: Biagio Borg, MD  Patient coming from: [facility]  I have personally briefly reviewed patient's old medical records in [place]  Chief Complaint: fatigue black stools   HPI: Caitlin Greene is a 56 y.o. female with medical history significant of  medical history significant of hyperlipidemia, anemia, anxiety, depression, GERD, breast cancer, compression fracture with interim history of admission of GI bleed 5/7/-5/9 2023 at which time Edg noted findings of hiatal hernia with linear erosions for which BID PPI with one month of carafate was recommended.  Patient no returns to ED with increase fatigue over the last few days and dark stools that started 24 hour prior to presentation that was associated with intermittent epigastric pain. Patient states she has had gerd like symptoms and stomach pain since discharged but notes symptoms would resolved with taking prescribed medications. Patient states over the last 24 hours she became concerned due to continued fatigue recurrent black stools as well as feels of presyncope. She denies any coffee ground emesis and notes no current nausea at this time. She denies sob or chest pain.   ED Course:  IN ED  vitals: Afeb, bp 114/75, hr 75, rr 16, sat 99% ra  Labs: Na 138,K 3, cr:0.88, ast 12 Lipase 24 Wbc: 21, hgb 7.6/8.2 was10.9 ( 1 week ago )  mcv 105.3, plt 139  Tsh 4.8 B12 179 Iron 50 ferritin 282, folate 16.2, bn p 18 Inr 1.1 Mag 1.9, K 2.9 tx 1L, protonix iv  Review of Systems: As per HPI otherwise 10 point review of systems negative.   Past Medical History:  Diagnosis Date   ALLERGIC RHINITIS    ANEMIA-IRON DEFICIENCY    ANXIETY    BREAST CANCER, HX OF 01/21/2007   at 56yo   DEPRESSION    GERD    HYPERLIPIDEMIA    Metastatic cancer to bone ([place]) dx'd 10/24/2019   recurrent breast ca   RENAL CALCULUS    rt breast ca dx'd 1988    Past  Surgical History:  Procedure Laterality Date   BIOPSY  03/21/2020   Procedure: BIOPSY;  Surgeon: Thornton Park, MD;  Location: [facility];  Service: Gastroenterology;;   BIOPSY  09/13/2021   Procedure: BIOPSY;  Surgeon: Milus Banister, MD;  Location: [facility];  Service: Gastroenterology;;   BREAST ENHANCEMENT SURGERY     ESOPHAGOGASTRODUODENOSCOPY (EGD) WITH PROPOFOL N/A 03/21/2020   Procedure: ESOPHAGOGASTRODUODENOSCOPY (EGD) WITH PROPOFOL;  Surgeon: Thornton Park, MD;  Location: [facility];  Service: Gastroenterology;  Laterality: N/A;   ESOPHAGOGASTRODUODENOSCOPY (EGD) WITH PROPOFOL N/A 09/13/2021   Procedure: ESOPHAGOGASTRODUODENOSCOPY (EGD) WITH PROPOFOL;  Surgeon: Milus Banister, MD;  [facility];  Service: Gastroenterology;  Laterality: N/A;   MASTECTOMY     right   TEMPOROMANDIBULAR JOINT SURGERY     Left   TONSILLECTOMY       reports that she has never smoked. She has never used smokeless tobacco. She reports that she does not drink alcohol and does not use drugs.  Allergies  Allergen Reactions   Amoxicillin-Pot Clavulanate Hives and Itching   Penicillins Hives, Itching and Other (See Comments)   Codeine Nausea And Vomiting    Needs pre-meds     Family History  Problem Relation Age of Onset   Colon polyps Mother    Cancer Mother        Uterine Cancer   Hypertension Mother    Dementia  Mother    Colon polyps Father    COPD Father        smoked   Hypertension Other    Diabetes Other    Asthma Sister     Prior to Admission medications   Medication Sig Start Date End Date Taking? Authorizing Provider  acetaminophen (TYLENOL) 650 MG CR tablet Take 650 mg by mouth every 8 (eight) hours as needed for pain.   Yes [provider]  CALCIUM PO Take 1 tablet by mouth daily.   Yes [provider]  famciclovir (FAMVIR) 250 MG tablet Take 1 tablet (250 mg total) by mouth daily. Patient taking differently: Take 250 mg by mouth  daily as needed (cold sore). 04/07/20  Yes [name], MD  FLUoxetine (PROZAC) 20 MG capsule TAKE 3 CAPSULES(60 MG) BY MOUTH DAILY Patient taking differently: 60 mg daily. 06/25/21  Yes Biagio Borg, MD  fulvestrant (FASLODEX) 250 MG/5ML injection Inject 500 mg into the muscle every 30 (thirty) days. One injection each buttock over 1-2 minutes. Warm prior to use.   Yes [provider]  hydrOXYzine (ATARAX/VISTARIL) 25 MG tablet Take 25 mg by mouth 3 (three) times daily as needed for anxiety. 12/31/19  Yes [provider]  IBRANCE 100 MG tablet Take 100 mg by mouth See admin instructions. 154m daily on day 1-21 of cycle. 7 days off.  Repeat. 03/13/20  Yes [provider]  ondansetron (ZOFRAN) 4 MG tablet Take 1 tablet (4 mg total) by mouth every 4 (four) hours as needed. Patient taking differently: Take 4 mg by mouth every 4 (four) hours as needed for nausea. 03/24/20  Yes AShelly Coss MD  oxyCODONE (OXY IR/ROXICODONE) 5 MG immediate release tablet Take 1 tablet (5 mg total) by mouth every 8 (eight) hours as needed for severe pain. 11/06/20  Yes Ennever, PRudell Cobb MD  polyethylene glycol (MIRALAX / GLYCOLAX) 17 g packet Take 17 g by mouth daily as needed. Patient taking differently: Take 17 g by mouth daily as needed for moderate constipation. 03/24/20  Yes AShelly Coss MD  sucralfate (CARAFATE) 1 g tablet Take 1 tablet (1 g total) by mouth 4 (four) times daily -  with meals and at bedtime. 09/15/21 10/15/21 Yes Esterwood, Amy S, PA-C  vitamin B-12 (CYANOCOBALAMIN) 1000 MCG tablet Take 1 tablet (1,000 mcg total) by mouth daily. 03/24/20  Yes Adhikari, ATamsen Meek MD  COVID-19 mRNA bivalent vaccine, Pfizer, (PFIZER COVID-19 VAC BIVALENT) injection Inject into the muscle. 03/15/21   SCarlyle Basques MD    Physical Exam: Vitals:   10/15/21 1435 10/15/21 1605 10/15/21 1700 10/15/21 1823  BP: 117/75 114/75 109/66 129/76  Pulse: 81 75 80 74  Resp: 20 16 20    Temp:    97.8  F (36.6 C)  TempSrc:      SpO2: 99% 99% 100% 100%  Weight:      Height:         Vitals:   10/15/21 1435 10/15/21 1605 10/15/21 1700 10/15/21 1823  BP: 117/75 114/75 109/66 129/76  Pulse: 81 75 80 74  Resp: 20 16 20    Temp:    97.8 F (36.6 C)  TempSrc:      SpO2: 99% 99% 100% 100%  Weight:      Height:      Constitutional: NAD, calm, comfortable Eyes: PERRL, lids and conjunctivae normal ENMT: Mucous membranes are moist. Posterior pharynx clear of any exudate or lesions.Normal dentition.  Neck: normal, supple, no masses, no thyromegaly Respiratory:  clear to auscultation bilaterally, no wheezing, no crackles. Normal respiratory effort. No accessory muscle use.  Cardiovascular: Regular rate and rhythm, no murmurs / rubs / gallops. No extremity edema. 2+ pedal pulses. No carotid bruits.  Abdomen: no tenderness, no masses palpated. No hepatosplenomegaly. Bowel sounds positive.  Musculoskeletal: no clubbing / cyanosis. No joint deformity upper and lower extremities. Good ROM, no contractures. Normal muscle tone.  Skin: no rashes, lesions, ulcers. No induration Neurologic: CN 2-12 grossly intact. Sensation intact, DTR normal. Strength 5/5 in all 4.  Psychiatric: Normal judgment and insight. Alert and oriented x 3. Normal mood.    Labs on Admission: I have personally reviewed following labs and imaging studies  CBC: Recent Labs  Lab 10/15/21 1402  WBC 2.1*  NEUTROABS 1.3*  HGB 8.2*  HCT 23.7*  MCV 105.3*  PLT 163*   Basic Metabolic Panel: Recent Labs  Lab 10/15/21 1402  NA 138  K 3.0*  CL 109  CO2 25  GLUCOSE 90  BUN 12  CREATININE 0.88  CALCIUM 8.4*   GFR: Estimated Creatinine Clearance: 82.5 mL/min (by C-G formula based on SCr of 0.88 mg/dL). Liver Function Tests: Recent Labs  Lab 10/15/21 1402  AST 12*  ALT 10  ALKPHOS 37*  BILITOT 0.6  PROT 6.3*  ALBUMIN 3.5   Recent Labs  Lab 10/15/21 1402  LIPASE 24   No results for input(s): "AMMONIA" in  the last 168 hours. Coagulation Profile: No results for input(s): "INR", "PROTIME" in the last 168 hours. Cardiac Enzymes: No results for input(s): "CKTOTAL", "CKMB", "CKMBINDEX", "TROPONINI" in the last 168 hours. BNP (last 3 results) No results for input(s): "PROBNP" in the last 8760 hours. HbA1C: No results for input(s): "HGBA1C" in the last 72 hours. CBG: No results for input(s): "GLUCAP" in the last 168 hours. Lipid Profile: No results for input(s): "CHOL", "HDL", "LDLCALC", "TRIG", "CHOLHDL", "LDLDIRECT" in the last 72 hours. Thyroid Function Tests: No results for input(s): "TSH", "T4TOTAL", "FREET4", "T3FREE", "THYROIDAB" in the last 72 hours. Anemia Panel: No results for input(s): "VITAMINB12", "FOLATE", "FERRITIN", "TIBC", "IRON", "RETICCTPCT" in the last 72 hours. Urine analysis:    Component Value Date/Time   COLORURINE YELLOW 04/12/2018 [place] 04/12/2018 1413   LABSPEC 1.025 04/12/2018 1413   PHURINE 6.5 04/12/2018 1413   GLUCOSEU NEGATIVE 04/12/2018 1413   HGBUR NEGATIVE 04/12/2018 1413   BILIRUBINUR negative 07/06/2019 1507   BILIRUBINUR 1 12/20/2014 1007   KETONESUR negative 07/06/2019 [place] 04/12/2018 1413   PROTEINUR negative 07/06/2019 1507   PROTEINUR neg 12/20/2014 1007   UROBILINOGEN 1.0 07/06/2019 1507   UROBILINOGEN 0.2 04/12/2018 1413   NITRITE Negative 07/06/2019 1507   NITRITE NEGATIVE 04/12/2018 1413   LEUKOCYTESUR Negative 07/06/2019 1507    Radiological Exams on Admission: No results found.  EKG: Independently reviewed. N/a  Assessment/Plan  Recurrent GI bleed  -admit to progressive  -gi consult Dr Johann Capers -protonix iv bid  - cycle h/h   Symptomatic anemia  -transfuse 1 unit prbc  -anemia labs   Hypokalemia  -replete prn   B12 def  -iv supplementation x 1   Mild abn TSH -check t4  Hx of BRCA  -Patient currently being treated for breast cancer with mets to the bone -Current  regimen is Faslodex, [place] - Followed by Dr. Marin Olp  GERD -ppi  Depression -resume ssri   DVT prophylaxis: scd Code Status: full Family Communication: none at bedside Disposition Plan: patient  expected to be admitted  greater than 2 midnights  Consults called: see above Admission status: progressive   Clance Boll MD [facility]   If 7PM-7AM, please contact night-coverage [email or web address] Password [facility]  10/15/2021, 9:24 PM

## 2021-10-15 NOTE — Telephone Encounter (Signed)
Message received from patient at 12:30PM stating that she noticed dark blood in her stool and is not feeling well.  Call placed back to patient and patient states that she is currently on the way to be seen at [facility] ER.  Dr. Marin Olp notified.

## 2021-10-16 ENCOUNTER — Encounter (HOSPITAL_COMMUNITY): Payer: Self-pay | Admitting: Internal Medicine

## 2021-10-16 ENCOUNTER — Inpatient Hospital Stay (HOSPITAL_COMMUNITY): Payer: BC Managed Care – PPO | Admitting: Certified Registered"

## 2021-10-16 ENCOUNTER — Inpatient Hospital Stay (HOSPITAL_COMMUNITY): Payer: BC Managed Care – PPO

## 2021-10-16 ENCOUNTER — Inpatient Hospital Stay: Payer: Self-pay

## 2021-10-16 ENCOUNTER — Encounter (HOSPITAL_COMMUNITY)
Admission: EM | Disposition: A | Discharge status: Home / Self Care | Payer: Self-pay | Source: Home / Self Care | Attending: Internal Medicine

## 2021-10-16 DIAGNOSIS — K254 Chronic or unspecified gastric ulcer with hemorrhage: Secondary | ICD-10-CM | POA: Diagnosis not present

## 2021-10-16 DIAGNOSIS — D62 Acute posthemorrhagic anemia: Secondary | ICD-10-CM | POA: Diagnosis not present

## 2021-10-16 DIAGNOSIS — K2100 Gastro-esophageal reflux disease with esophagitis, without bleeding: Secondary | ICD-10-CM | POA: Diagnosis not present

## 2021-10-16 DIAGNOSIS — K259 Gastric ulcer, unspecified as acute or chronic, without hemorrhage or perforation: Secondary | ICD-10-CM

## 2021-10-16 DIAGNOSIS — K222 Esophageal obstruction: Secondary | ICD-10-CM

## 2021-10-16 DIAGNOSIS — K922 Gastrointestinal hemorrhage, unspecified: Secondary | ICD-10-CM | POA: Diagnosis not present

## 2021-10-16 HISTORY — PX: ESOPHAGOGASTRODUODENOSCOPY: SHX5428

## 2021-10-16 LAB — IRON AND TIBC
Iron: 50 ug/dL (ref 28–170)
Saturation Ratios: 15 % (ref 10.4–31.8)
TIBC: 332 ug/dL (ref 250–450)
UIBC: 282 ug/dL

## 2021-10-16 LAB — HEMOGLOBIN AND HEMATOCRIT, BLOOD
HCT: 24.6 % — ABNORMAL LOW (ref 36.0–46.0)
HCT: 27.2 % — ABNORMAL LOW (ref 36.0–46.0)
Hemoglobin: 8.3 g/dL — ABNORMAL LOW (ref 12.0–15.0)
Hemoglobin: 9.1 g/dL — ABNORMAL LOW (ref 12.0–15.0)

## 2021-10-16 LAB — URINALYSIS, ROUTINE W REFLEX MICROSCOPIC
Bacteria, UA: NONE SEEN
Bilirubin Urine: NEGATIVE
Glucose, UA: 50 mg/dL — AB
Ketones, ur: NEGATIVE mg/dL
Leukocytes,Ua: NEGATIVE
Nitrite: NEGATIVE
Protein, ur: NEGATIVE mg/dL
Specific Gravity, Urine: 1.005 (ref 1.005–1.030)
pH: 5 (ref 5.0–8.0)

## 2021-10-16 LAB — CBC WITH DIFFERENTIAL/PLATELET
Abs Immature Granulocytes: 0.01 10*3/uL (ref 0.00–0.07)
Basophils Absolute: 0 10*3/uL (ref 0.0–0.1)
Basophils Relative: 1 %
Eosinophils Absolute: 0 10*3/uL (ref 0.0–0.5)
Eosinophils Relative: 0 %
HCT: 27.7 % — ABNORMAL LOW (ref 36.0–46.0)
Hemoglobin: 9.3 g/dL — ABNORMAL LOW (ref 12.0–15.0)
Immature Granulocytes: 1 %
Lymphocytes Relative: 10 %
Lymphs Abs: 0.2 10*3/uL — ABNORMAL LOW (ref 0.7–4.0)
MCH: 34.7 pg — ABNORMAL HIGH (ref 26.0–34.0)
MCHC: 33.6 g/dL (ref 30.0–36.0)
MCV: 103.4 fL — ABNORMAL HIGH (ref 80.0–100.0)
Monocytes Absolute: 0.1 10*3/uL (ref 0.1–1.0)
Monocytes Relative: 4 %
Neutro Abs: 1.6 10*3/uL — ABNORMAL LOW (ref 1.7–7.7)
Neutrophils Relative %: 84 %
Platelet Morphology: NORMAL
Platelets: 136 10*3/uL — ABNORMAL LOW (ref 150–400)
RBC: 2.68 MIL/uL — ABNORMAL LOW (ref 3.87–5.11)
RDW: 19.9 % — ABNORMAL HIGH (ref 11.5–15.5)
WBC: 1.9 10*3/uL — ABNORMAL LOW (ref 4.0–10.5)
nRBC: 0 % (ref 0.0–0.2)

## 2021-10-16 LAB — PROTIME-INR
INR: 1.1 (ref 0.8–1.2)
Prothrombin Time: 14 seconds (ref 11.4–15.2)

## 2021-10-16 LAB — COMPREHENSIVE METABOLIC PANEL
ALT: 11 U/L (ref 0–44)
ALT: 13 U/L (ref 0–44)
AST: 11 U/L — ABNORMAL LOW (ref 15–41)
AST: 16 U/L (ref 15–41)
Albumin: 3.3 g/dL — ABNORMAL LOW (ref 3.5–5.0)
Albumin: 3.4 g/dL — ABNORMAL LOW (ref 3.5–5.0)
Alkaline Phosphatase: 34 U/L — ABNORMAL LOW (ref 38–126)
Alkaline Phosphatase: 37 U/L — ABNORMAL LOW (ref 38–126)
Anion gap: 4 — ABNORMAL LOW (ref 5–15)
Anion gap: 4 — ABNORMAL LOW (ref 5–15)
BUN: 11 mg/dL (ref 6–20)
BUN: 9 mg/dL (ref 6–20)
CO2: 26 mmol/L (ref 22–32)
CO2: 22 mmol/L (ref 22–32)
Calcium: 8.1 mg/dL — ABNORMAL LOW (ref 8.9–10.3)
Calcium: 7.8 mg/dL — ABNORMAL LOW (ref 8.9–10.3)
Chloride: 114 mmol/L — ABNORMAL HIGH (ref 98–111)
Chloride: 114 mmol/L — ABNORMAL HIGH (ref 98–111)
Creatinine, Ser: 0.8 mg/dL (ref 0.44–1.00)
Creatinine, Ser: 0.88 mg/dL (ref 0.44–1.00)
GFR, Estimated: >60 mL/min (ref >60)
GFR, Estimated: >60 mL/min (ref >60)
Glucose, Bld: 92 mg/dL (ref 70–99)
Glucose, Bld: 156 mg/dL — ABNORMAL HIGH (ref 70–99)
Potassium: 3 mmol/L — ABNORMAL LOW (ref 3.5–5.1)
Potassium: 3.9 mmol/L (ref 3.5–5.1)
Sodium: 144 mmol/L (ref 135–145)
Sodium: 140 mmol/L (ref 135–145)
Total Bilirubin: 0.9 mg/dL (ref 0.3–1.2)
Total Bilirubin: 0.9 mg/dL (ref 0.3–1.2)
Total Protein: 5.8 g/dL — ABNORMAL LOW (ref 6.5–8.1)
Total Protein: 6 g/dL — ABNORMAL LOW (ref 6.5–8.1)

## 2021-10-16 LAB — BRAIN NATRIURETIC PEPTIDE: B Natriuretic Peptide: 18 pg/mL (ref 0.0–100.0)

## 2021-10-16 LAB — VITAMIN B12: Vitamin B-12: 179 pg/mL — ABNORMAL LOW (ref 180–914)

## 2021-10-16 LAB — GLUCOSE, CAPILLARY: Glucose-Capillary: 94 mg/dL (ref 70–99)

## 2021-10-16 LAB — PHOSPHORUS: Phosphorus: 1.1 mg/dL — ABNORMAL LOW (ref 2.5–4.6)

## 2021-10-16 LAB — PROCALCITONIN: Procalcitonin: <0.1 ng/mL

## 2021-10-16 LAB — HEMOGLOBIN A1C
Hgb A1c MFr Bld: 4.3 % — ABNORMAL LOW (ref 4.8–5.6)
Mean Plasma Glucose: 76.71 mg/dL

## 2021-10-16 LAB — T4, FREE: Free T4: 0.8 ng/dL (ref 0.61–1.12)

## 2021-10-16 LAB — LACTIC ACID, PLASMA
Lactic Acid, Venous: 2.2 mmol/L (ref 0.5–1.9)
Lactic Acid, Venous: 3.6 mmol/L (ref 0.5–1.9)

## 2021-10-16 LAB — FERRITIN: Ferritin: 21 ng/mL (ref 11–307)

## 2021-10-16 LAB — MAGNESIUM: Magnesium: 2.7 mg/dL — ABNORMAL HIGH (ref 1.7–2.4)

## 2021-10-16 LAB — MRSA NEXT GEN BY PCR, NASAL: MRSA by PCR Next Gen: DETECTED — AB

## 2021-10-16 LAB — CORTISOL: Cortisol, Plasma: 22.7 ug/dL

## 2021-10-16 LAB — FOLATE: Folate: 16.3 ng/mL (ref >5.9)

## 2021-10-16 SURGERY — EGD (ESOPHAGOGASTRODUODENOSCOPY)
Anesthesia: Monitor Anesthesia Care

## 2021-10-16 MED ORDER — PROCHLORPERAZINE EDISYLATE 10 MG/2ML IJ SOLN
10.0000 mg | Freq: Four times a day (QID) | INTRAMUSCULAR | Status: DC | PRN
Start: 2021-10-16 — End: 2021-10-21
  Administered 2021-10-16: 10 mg via INTRAVENOUS
  Filled 2021-10-16: qty 2

## 2021-10-16 MED ORDER — POTASSIUM CHLORIDE CRYS ER 20 MEQ PO TBCR
40.0000 meq | EXTENDED_RELEASE_TABLET | ORAL | Status: AC
  Administered 2021-10-16: 40 meq via ORAL
  Filled 2021-10-16: qty 2

## 2021-10-16 MED ORDER — FAMOTIDINE IN NACL 20-0.9 MG/50ML-% IV SOLN
INTRAVENOUS | Status: AC
  Filled 2021-10-16: qty 50

## 2021-10-16 MED ORDER — PROPOFOL 10 MG/ML IV BOLUS
INTRAVENOUS | Status: DC | PRN
  Administered 2021-10-16 (×4): 20 mg via INTRAVENOUS

## 2021-10-16 MED ORDER — MIDODRINE HCL 5 MG PO TABS
5.0000 mg | ORAL_TABLET | Freq: Three times a day (TID) | ORAL | Status: DC
  Administered 2021-10-16: 5 mg via ORAL
  Filled 2021-10-16: qty 1

## 2021-10-16 MED ORDER — DIPHENHYDRAMINE HCL 50 MG/ML IJ SOLN
INTRAMUSCULAR | Status: AC
  Filled 2021-10-16: qty 1

## 2021-10-16 MED ORDER — MAGNESIUM SULFATE 2 GM/50ML IV SOLN
2.0000 g | Freq: Once | INTRAVENOUS | Status: AC
  Administered 2021-10-16: 2 g via INTRAVENOUS
  Filled 2021-10-16: qty 50

## 2021-10-16 MED ORDER — PROPOFOL 500 MG/50ML IV EMUL
INTRAVENOUS | Status: DC | PRN
  Administered 2021-10-16: 125 ug/kg/min via INTRAVENOUS

## 2021-10-16 MED ORDER — METHYLPREDNISOLONE SODIUM SUCC 125 MG IJ SOLR
INTRAMUSCULAR | Status: AC
  Filled 2021-10-16: qty 2

## 2021-10-16 MED ORDER — FAMOTIDINE IN NACL 20-0.9 MG/50ML-% IV SOLN
20.0000 mg | INTRAVENOUS | Status: AC
  Administered 2021-10-16: 20 mg via INTRAVENOUS

## 2021-10-16 MED ORDER — SODIUM CHLORIDE 0.9 % IV SOLN: INTRAVENOUS | Status: DC | PRN

## 2021-10-16 MED ORDER — MIDODRINE HCL 5 MG PO TABS
10.0000 mg | ORAL_TABLET | ORAL | Status: AC
  Administered 2021-10-16: 10 mg via ORAL
  Filled 2021-10-16 (×2): qty 2

## 2021-10-16 MED ORDER — PROPOFOL 500 MG/50ML IV EMUL
INTRAVENOUS | Status: AC
  Filled 2021-10-16: qty 50

## 2021-10-16 MED ORDER — METHYLPREDNISOLONE SODIUM SUCC 125 MG IJ SOLR
125.0000 mg | INTRAMUSCULAR | Status: AC
Start: 2021-10-16 — End: 2021-10-16
  Administered 2021-10-16: 125 mg via INTRAVENOUS
  Filled 2021-10-16: qty 2

## 2021-10-16 MED ORDER — METHYLPREDNISOLONE SODIUM SUCC 125 MG IJ SOLR: 80.0000 mg | INTRAMUSCULAR | Status: DC

## 2021-10-16 MED ORDER — SODIUM CHLORIDE 0.9 % IV SOLN
510.0000 mg | Freq: Once | INTRAVENOUS | Status: AC
  Administered 2021-10-16: 510 mg via INTRAVENOUS
  Filled 2021-10-16: qty 17

## 2021-10-16 MED ORDER — FOLIC ACID 1 MG PO TABS
1.0000 mg | ORAL_TABLET | Freq: Every day | ORAL | Status: DC
  Administered 2021-10-16 – 2021-10-20 (×5): 1 mg via ORAL
  Filled 2021-10-16 (×5): qty 1

## 2021-10-16 MED ORDER — LIDOCAINE 2% (20 MG/ML) 5 ML SYRINGE
INTRAMUSCULAR | Status: DC | PRN
  Administered 2021-10-16: 40 mg via INTRAVENOUS

## 2021-10-16 MED ORDER — DIPHENHYDRAMINE HCL 50 MG/ML IJ SOLN
25.0000 mg | INTRAMUSCULAR | Status: AC
  Administered 2021-10-16: 25 mg via INTRAVENOUS
  Filled 2021-10-16: qty 1

## 2021-10-16 MED ORDER — POTASSIUM CHLORIDE 10 MEQ/100ML IV SOLN
INTRAVENOUS | Status: AC
  Filled 2021-10-16: qty 100

## 2021-10-16 MED ORDER — POTASSIUM CHLORIDE 10 MEQ/100ML IV SOLN
10.0000 meq | INTRAVENOUS | Status: AC
  Administered 2021-10-16 (×5): 10 meq via INTRAVENOUS
  Filled 2021-10-16 (×2): qty 100

## 2021-10-16 MED ORDER — POTASSIUM CHLORIDE 10 MEQ/100ML IV SOLN
INTRAVENOUS | Status: AC
  Filled 2021-10-16: qty 200

## 2021-10-16 MED ORDER — SODIUM CHLORIDE 0.9 % IV BOLUS
1000.0000 mL | Freq: Once | INTRAVENOUS | Status: AC
  Administered 2021-10-16: 1000 mL via INTRAVENOUS

## 2021-10-16 MED ORDER — POTASSIUM CHLORIDE 10 MEQ/100ML IV SOLN
INTRAVENOUS | Status: AC
  Administered 2021-10-16: 10 meq
  Filled 2021-10-16: qty 100

## 2021-10-16 MED ORDER — IPRATROPIUM-ALBUTEROL 0.5-2.5 (3) MG/3ML IN SOLN
3.0000 mL | RESPIRATORY_TRACT | Status: AC
  Administered 2021-10-16: 3 mL via RESPIRATORY_TRACT
  Filled 2021-10-16: qty 3

## 2021-10-16 MED ORDER — MUPIROCIN 2 % EX OINT
1.0000 "application " | TOPICAL_OINTMENT | Freq: Two times a day (BID) | CUTANEOUS | Status: AC
  Administered 2021-10-16 – 2021-10-21 (×10): 1 via NASAL
  Filled 2021-10-16: qty 22

## 2021-10-16 MED ORDER — CHLORHEXIDINE GLUCONATE CLOTH 2 % EX PADS
6.0000 | MEDICATED_PAD | Freq: Every day | CUTANEOUS | Status: DC
  Administered 2021-10-16 – 2021-10-20 (×5): 6 via TOPICAL

## 2021-10-16 MED ORDER — SODIUM CHLORIDE 0.9 % IV BOLUS
250.0000 mL | INTRAVENOUS | Status: AC
  Administered 2021-10-16: 250 mL via INTRAVENOUS

## 2021-10-16 MED ORDER — CYANOCOBALAMIN 1000 MCG/ML IJ SOLN
1000.0000 ug | Freq: Every day | INTRAMUSCULAR | Status: AC
  Administered 2021-10-17 – 2021-10-20 (×4): 1000 ug via INTRAMUSCULAR
  Filled 2021-10-16 (×6): qty 1

## 2021-10-16 NOTE — Consult Note (Signed)
Referral MD  Reason for Referral: Upper GI bleeding-likely gastric erosions; metastatic breast cancer-bone metastasis  Chief Complaint  Patient presents with   Abdominal Pain  : I started bleeding again.  HPI: Caitlin Greene is well-known to me.  She is a very nice 56 year old white female.  She has metastatic breast cancer.  She has bone only metastasis.  She is done incredibly well.  She is on Faslodex and ribociclib.  She was seen on 10/08/2021.  That time, her CA 27.29 was improving.  She was hospitalized about a month ago because of upper GI bleeding.  She had an upper GI endoscopy which did show some gastric erosions.  She had a hiatal hernia.  She was doing well earlier this week.  However, she then began to have some melena.  She had abdominal pain.  She was not that [facility] great.  She had some nausea but no vomiting.  She went to the emergency room.  She is found to have a hemoglobin of 8.2.  Her white count was 2.1.  Platelet count 129,000.  She had a metabolic panel which showed a potassium of 3.0.  Calcium was 8.4.  BUN 12 creatinine 0.88.  Her liver tests were normal.  She had iron studies that were done.  Her iron saturation was 15%.  Her ferritin was 21.  She had a low vitamin B12 level of 179.  She was admitted.  She has received 2 units of blood.  She she looks quite good.  She does have little bit of indigestion.  She has had no fever.  There is been no rashes.  She has had no leg swelling.  She has had no urinary difficulties.  Currently, I would say performance status is probably ECOG 1.    Past Medical History:  Diagnosis Date   ALLERGIC RHINITIS    ANEMIA-IRON DEFICIENCY    ANXIETY    BREAST CANCER, HX OF 01/21/2007   at 56yo   DEPRESSION    GERD    HYPERLIPIDEMIA    Metastatic cancer to bone ([place]) dx'd 10/24/2019   recurrent breast ca   RENAL CALCULUS    rt breast ca dx'd 1988  :   Past Surgical History:  Procedure Laterality Date   BIOPSY  03/21/2020    Procedure: BIOPSY;  Surgeon: Thornton Park, MD;  [facility];  Service: Gastroenterology;;   BIOPSY  09/13/2021   Procedure: BIOPSY;  Surgeon: Milus Banister, MD;  Location: [facility];  Service: Gastroenterology;;   BREAST ENHANCEMENT SURGERY     ESOPHAGOGASTRODUODENOSCOPY (EGD) WITH PROPOFOL N/A 03/21/2020   Procedure: ESOPHAGOGASTRODUODENOSCOPY (EGD) WITH PROPOFOL;  Surgeon: Thornton Park, MD;  [facility];  Service: Gastroenterology;  Laterality: N/A;   ESOPHAGOGASTRODUODENOSCOPY (EGD) WITH PROPOFOL N/A 09/13/2021   Procedure: ESOPHAGOGASTRODUODENOSCOPY (EGD) WITH PROPOFOL;  Surgeon: Milus Banister, MD;  Location[facility];  Service: Gastroenterology;  Laterality: N/A;   MASTECTOMY     right   TEMPOROMANDIBULAR JOINT SURGERY     Left   TONSILLECTOMY    :   Current Facility-Administered Medications:    0.9 %  sodium chloride infusion (Manually program via Guardrails IV Fluids), , Intravenous, Once, Myles Rosenthal A, MD   0.9 %  sodium chloride infusion (Manually program via Guardrails IV Fluids), , Intravenous, Once, Myles Rosenthal A, MD   0.9 %  sodium chloride infusion, , Intravenous, Continuous, Clance Boll, MD, Last Rate: 75 mL/hr at 10/15/21 2236, New Bag at 10/15/21 2236  acetaminophen (TYLENOL) tablet 650 mg, 650 mg, Oral, Q6H PRN **OR** acetaminophen (TYLENOL) suppository 650 mg, 650 mg, Rectal, Q6H PRN, Clance Boll, MD   albuterol (PROVENTIL) (2.5 MG/3ML) 0.083% nebulizer solution 2.5 mg, 2.5 mg, Nebulization, Q2H PRN, Clance Boll, MD   FLUoxetine (PROZAC) capsule 60 mg, 60 mg, Oral, Daily, Clance Boll, MD   folic acid (FOLVITE) tablet 1 mg, 1 mg, Oral, Daily, Clance Boll, MD   hydrOXYzine (ATARAX) tablet 25 mg, 25 mg, Oral, TID PRN, Clance Boll, MD   morphine (PF) 4 MG/ML injection 4 mg, 4 mg, Intravenous, Once, Tyrone Nine, Dan, DO   oxyCODONE (Oxy IR/ROXICODONE) immediate release tablet 5  mg, 5 mg, Oral, Q8H PRN, Clance Boll, MD   pantoprazole (PROTONIX) injection 40 mg, 40 mg, Intravenous, Q12H, Myles Rosenthal A, MD, 40 mg at 10/15/21 2234   potassium chloride 10 mEq in 100 mL IVPB, 10 mEq, Intravenous, Q1 Hr x 6, Myles Rosenthal A, MD   sucralfate (CARAFATE) tablet 1 g, 1 g, Oral, TID WC & HS, Clance Boll, MD   vitamin B-12 (CYANOCOBALAMIN) tablet 1,000 mcg, 1,000 mcg, Oral, Daily, Myles Rosenthal A, MD:   sodium chloride   Intravenous Once   sodium chloride   Intravenous Once   FLUoxetine  60 mg Oral Daily   folic acid  1 mg Oral Daily    morphine injection  4 mg Intravenous Once   pantoprazole (PROTONIX) IV  40 mg Intravenous Q12H   sucralfate  1 g Oral TID WC & HS   vitamin B-12  1,000 mcg Oral Daily  :   Allergies  Allergen Reactions   Amoxicillin-Pot Clavulanate Hives and Itching   Penicillins Hives, Itching and Other (See Comments)   Codeine Nausea And Vomiting    Needs pre-meds   :   Family History  Problem Relation Age of Onset   Colon polyps Mother    Cancer Mother        Uterine Cancer   Hypertension Mother    Dementia Mother    Colon polyps Father    COPD Father        smoked   Hypertension Other    Diabetes Other    Asthma Sister   :   Social History   Socioeconomic History   Marital status: Widowed    Spouse name: Not on file   Number of children: 0   Years of education: Not on file   Highest education level: Not on file  Occupational History   Occupation: BANKRUPTCY ANALYST    Employer: [employer]  Tobacco Use   Smoking status: Never   Smokeless tobacco: Never  Vaping Use   Vaping Use: Never used  Substance and Sexual Activity   Alcohol use: No    Alcohol/week: 0.0 standard drinks of alcohol   Drug use: No   Sexual activity: Not on file  Other Topics Concern   Not on file  Social History Narrative   Not on file   Social Determinants of Health   Financial Resource Strain: Not on file   Food Insecurity: Not on file  Transportation Needs: Not on file  Physical Activity: Not on file  Stress: Not on file  Social Connections: Not on file  Intimate Partner Violence: Not on file  :  Review of Systems  Constitutional: Negative.   HENT: Negative.    Eyes: Negative.   Respiratory: Negative.    Cardiovascular: Negative.   Gastrointestinal:  Positive  for blood in stool, heartburn, melena and nausea.  Genitourinary: Negative.   Musculoskeletal:  Positive for joint pain.  Skin: Negative.   Neurological: Negative.   Endo/Heme/Allergies: Negative.   Psychiatric/Behavioral: Negative.       Exam: Patient Vitals for the past 24 hrs:  BP Temp Temp src Pulse Resp SpO2 Height Weight  10/16/21 0627 106/70 97.6 F (36.4 C) Oral 82 18 99 % -- --  10/16/21 0346 112/63 97.7 F (36.5 C) Oral 74 -- 98 % -- --  10/16/21 0257 103/62 97.7 F (36.5 C) Oral 79 18 97 % -- --  10/16/21 0046 118/63 97.8 F (36.6 C) Oral 77 -- -- -- --  10/16/21 0019 113/64 97.8 F (36.6 C) Oral 79 18 99 % -- --  10/15/21 2156 116/76 97.8 F (36.6 C) Oral 75 18 100 % -- --  10/15/21 1823 129/76 97.8 F (36.6 C) -- 74 -- 100 % -- --  10/15/21 1700 109/66 -- -- 80 20 100 % -- --  10/15/21 1605 114/75 -- -- 75 16 99 % -- --  10/15/21 1435 117/75 -- -- 81 20 99 % -- --  10/15/21 1319 (!) 121/93 98.5 F (36.9 C) Oral (!) 102 20 99 % -- --  10/15/21 1319 -- -- -- -- -- -- '5\' 8"'$  (1.727 m) 197 lb (89.4 kg)   Physical Exam Vitals reviewed.  HENT:     Head: Normocephalic and atraumatic.  Eyes:     Pupils: Pupils are equal, round, and reactive to light.  Cardiovascular:     Rate and Rhythm: Normal rate and regular rhythm.     Heart sounds: Normal heart sounds.  Pulmonary:     Effort: Pulmonary effort is normal.     Breath sounds: Normal breath sounds.  Abdominal:     General: Bowel sounds are normal.     Palpations: Abdomen is soft.  Musculoskeletal:        General: No tenderness or deformity.  Normal range of motion.     Cervical back: Normal range of motion.  Lymphadenopathy:     Cervical: No cervical adenopathy.  Skin:    General: Skin is warm and dry.     Findings: No erythema or rash.  Neurological:     Mental Status: She is alert and oriented to person, place, and time.  Psychiatric:        Behavior: Behavior normal.        Thought Content: Thought content normal.        Judgment: Judgment normal.     Recent Labs    10/15/21 1402 10/15/21 2242  WBC 2.1* 2.7*  HGB 8.2* 7.6*  HCT 23.7* 22.5*  PLT 139* 144*    Recent Labs    10/15/21 1402 10/15/21 2242  NA 138 140  K 3.0* 2.9*  CL 109 111  CO2 25 24  GLUCOSE 90 85  BUN 12 10  CREATININE 0.88 0.83  CALCIUM 8.4* 8.1*    Blood smear review: None  Pathology: None    Assessment and Plan: Ms. Scism is a very nice 56 year old white female patient of metastatic breast cancer.  So far, metastasis has been bone only.  She has GI bleeding again.  Again I suspect she probably has gastric erosions.  She has this on her last upper endoscopy a month ago.  She was transfused 2 units of blood.  She is going to have 6 runs of potassium.  She is going need IV iron.  I think she will also need some vitamin B12 while in the hospital.  Thankfully, I do not think any of this is related to breast cancer.  She is not on any medication that would cause gastric erosions.  We will just have to see what Gastroenterology can do for her.  We will follow along.  Thankfully, I just do not think there is much that we need to do from a oncologic point of view.   Caitlin Haw, MD  [place] 15:26

## 2021-10-16 NOTE — Progress Notes (Signed)
Please note that during RR event, pt received IV Benadryl '25mg'$  IV X2, Solu-Medrol '125mg'$  IV X 2 per MD order. MD came to bedside and was present for administration once an IV site was found and accessed.

## 2021-10-16 NOTE — Consult Note (Signed)
Consultation  Referring Provider:     Dr. Irene Pap Primary Care Physician:  Biagio Borg, MD Primary Gastroenterologist:        Dr. Tarri Glenn Reason for Consultation:     Melena, acute on chronic anemia         HPI:   Caitlin Greene is a 56 y.o. female with a history of metastatic breast cancer and recent hospital admission for upper GI bleed attributed to Cameron's lesions, who presented to the emergency department yesterday with nausea and melenic stools with weakness.  She was found to have a hemoglobin of 8.2 down from 10.  Her BUN was not elevated.  She has chronic iron deficiency anemia.  The patient has chronic GERD for which she usually takes omeprazole daily.  Patient tells me that she was instructed to stop taking her omeprazole after her hospitalization, and that she was only to be taking Carafate.  She states that she has been taking the Carafate, but has not been taking the omeprazole, as that is what she was thought she was supposed to do.  She continues to have fairly regular GERD and dyspepsia symptoms, but her stools have been normal until the last couple days.  Unlike last time, she did not have any vomiting/coffee-ground emesis.  She received 1 unit PRBC after admission.  Hemoglobin went from 7.6 to 8.3 Ferritin on admission was 21, improved from 6 in May.  Iron indices were otherwise within normal limits. Past Medical History:  Diagnosis Date   ALLERGIC RHINITIS    ANEMIA-IRON DEFICIENCY    ANXIETY    BREAST CANCER, HX OF 01/21/2007   at 56yo   DEPRESSION    GERD    HYPERLIPIDEMIA    Metastatic cancer to bone ([place]) dx'd 10/24/2019   recurrent breast ca   RENAL CALCULUS    rt breast ca dx'd 1988    Past Surgical History:  Procedure Laterality Date   BIOPSY  03/21/2020   Procedure: BIOPSY;  Surgeon: Thornton Park, MD;  Location: [facility];  Service: Gastroenterology;;   BIOPSY  09/13/2021   Procedure: BIOPSY;  Surgeon: Milus Banister, MD;  Location: [facility]  ENDOSCOPY;  Service: Gastroenterology;;   BREAST ENHANCEMENT SURGERY     ESOPHAGOGASTRODUODENOSCOPY (EGD) WITH PROPOFOL N/A 03/21/2020   Procedure: ESOPHAGOGASTRODUODENOSCOPY (EGD) WITH PROPOFOL;  Surgeon: Thornton Park, MD;  Location: [facility];  Service: Gastroenterology;  Laterality: N/A;   ESOPHAGOGASTRODUODENOSCOPY (EGD) WITH PROPOFOL N/A 09/13/2021   Procedure: ESOPHAGOGASTRODUODENOSCOPY (EGD) WITH PROPOFOL;  Surgeon: Milus Banister, MD;  Location: [facility];  Service: Gastroenterology;  Laterality: N/A;   MASTECTOMY     right   TEMPOROMANDIBULAR JOINT SURGERY     Left   TONSILLECTOMY      Family History  Problem Relation Age of Onset   Colon polyps Mother    Cancer Mother        Uterine Cancer   Hypertension Mother    Dementia Mother    Colon polyps Father    COPD Father        smoked   Hypertension Other    Diabetes Other    Asthma Sister      Social History   Tobacco Use   Smoking status: Never   Smokeless tobacco: Never  Vaping Use   Vaping Use: Never used  Substance Use Topics   Alcohol use: No    Alcohol/week: 0.0 standard drinks of alcohol   Drug use: No    Prior to Admission medications  Medication Sig Start Date End Date Taking? Authorizing Provider  acetaminophen (TYLENOL) 650 MG CR tablet Take 650 mg by mouth every 8 (eight) hours as needed for pain.   Yes [provider]  CALCIUM PO Take 1 tablet by mouth daily.   Yes [provider]  famciclovir (FAMVIR) 250 MG tablet Take 1 tablet (250 mg total) by mouth daily. Patient taking differently: Take 250 mg by mouth daily as needed (cold sore). 04/07/20  Yes [name], MD  FLUoxetine (PROZAC) 20 MG capsule TAKE 3 CAPSULES(60 MG) BY MOUTH DAILY Patient taking differently: 60 mg daily. 06/25/21  Yes Biagio Borg, MD  fulvestrant (FASLODEX) 250 MG/5ML injection Inject 500 mg into the muscle every 30 (thirty) days. One injection each buttock over 1-2 minutes. Warm prior to  use.   Yes [provider]  hydrOXYzine (ATARAX/VISTARIL) 25 MG tablet Take 25 mg by mouth 3 (three) times daily as needed for anxiety. 12/31/19  Yes [provider]  IBRANCE 100 MG tablet Take 100 mg by mouth See admin instructions. '100mg'$  daily on day 1-21 of cycle. 7 days off.  Repeat. 03/13/20  Yes [provider]  ondansetron (ZOFRAN) 4 MG tablet Take 1 tablet (4 mg total) by mouth every 4 (four) hours as needed. Patient taking differently: Take 4 mg by mouth every 4 (four) hours as needed for nausea. 03/24/20  Yes Shelly Coss, MD  oxyCODONE (OXY IR/ROXICODONE) 5 MG immediate release tablet Take 1 tablet (5 mg total) by mouth every 8 (eight) hours as needed for severe pain. 11/06/20  Yes Ennever, Rudell Cobb, MD  polyethylene glycol (MIRALAX / GLYCOLAX) 17 g packet Take 17 g by mouth daily as needed. Patient taking differently: Take 17 g by mouth daily as needed for moderate constipation. 03/24/20  Yes Shelly Coss, MD  sucralfate (CARAFATE) 1 g tablet Take 1 tablet (1 g total) by mouth 4 (four) times daily -  with meals and at bedtime. 09/15/21 10/15/21 Yes Esterwood, Amy S, PA-C  vitamin B-12 (CYANOCOBALAMIN) 1000 MCG tablet Take 1 tablet (1,000 mcg total) by mouth daily. 03/24/20  Yes Adhikari, Tamsen Meek, MD  COVID-19 mRNA bivalent vaccine, Pfizer, (PFIZER COVID-19 VAC BIVALENT) injection Inject into the muscle. 03/15/21   Carlyle Basques, MD    Current Facility-Administered Medications  Medication Dose Route Frequency Provider Last Rate Last Admin   0.9 %  sodium chloride infusion (Manually program via Guardrails IV Fluids)   Intravenous Once Myles Rosenthal A, MD       0.9 %  sodium chloride infusion (Manually program via Guardrails IV Fluids)   Intravenous Once Myles Rosenthal A, MD       0.9 %  sodium chloride infusion   Intravenous Continuous Clance Boll, MD 75 mL/hr at 10/15/21 2236 New Bag at 10/15/21 2236   acetaminophen (TYLENOL) tablet 650 mg  650 mg  Oral Q6H PRN Clance Boll, MD       Or   acetaminophen (TYLENOL) suppository 650 mg  650 mg Rectal Q6H PRN Clance Boll, MD       albuterol (PROVENTIL) (2.5 MG/3ML) 0.083% nebulizer solution 2.5 mg  2.5 mg Nebulization Q2H PRN Clance Boll, MD       cyanocobalamin ((VITAMIN B-12)) injection 1,000 mcg  1,000 mcg Intramuscular Daily Ennever, Rudell Cobb, MD       ferumoxytol [facility]) 510 mg in sodium chloride 0.9 % 100 mL IVPB  510 mg Intravenous Once [name], MD  FLUoxetine (PROZAC) capsule 60 mg  60 mg Oral Daily Clance Boll, MD       folic acid (FOLVITE) tablet 1 mg  1 mg Oral Daily Clance Boll, MD       hydrOXYzine (ATARAX) tablet 25 mg  25 mg Oral TID PRN Clance Boll, MD       morphine (PF) 4 MG/ML injection 4 mg  4 mg Intravenous Once Deno Etienne, DO       oxyCODONE (Oxy IR/ROXICODONE) immediate release tablet 5 mg  5 mg Oral Q8H PRN Clance Boll, MD       pantoprazole (PROTONIX) injection 40 mg  40 mg Intravenous Q12H Myles Rosenthal A, MD   40 mg at 10/15/21 2234   sucralfate (CARAFATE) tablet 1 g  1 g Oral TID WC & HS Clance Boll, MD       vitamin B-12 (CYANOCOBALAMIN) tablet 1,000 mcg  1,000 mcg Oral Daily Myles Rosenthal A, MD        Allergies as of 10/15/2021 - Review Complete 10/15/2021  Allergen Reaction Noted   Amoxicillin-pot clavulanate Hives and Itching 11/13/2010   Penicillins Hives, Itching, and Other (See Comments) 01/21/2007   Codeine Nausea And Vomiting 10/04/2017     Review of Systems:    As per HPI, otherwise negative    Physical Exam:  Vital signs in last 24 hours: Temp:  [97.6 F (36.4 C)-98.5 F (36.9 C)] 97.6 F (36.4 C) (06/10 0627) Pulse Rate:  [74-102] 82 (06/10 0627) Resp:  [16-20] 18 (06/10 0627) BP: (103-129)/(62-93) 106/70 (06/10 0627) SpO2:  [97 %-100 %] 99 % (06/10 0627) Weight:  [89.4 kg] 89.4 kg (06/09 1319) Last BM Date : 10/14/21 General:   Pleasant Caucasian  female in NAD Head:  Normocephalic and atraumatic. Eyes:   No icterus.   Conjunctiva pink. Ears:  Normal auditory acuity. Neck:  Supple Lungs:  Respirations even and unlabored. Lungs clear to auscultation bilaterally.   No wheezes, crackles, or rhonchi.  Heart:  Regular rate and rhythm; no MRG Abdomen:  Soft, nondistended, nontender. Normal bowel sounds. No appreciable masses or hepatomegaly.  Rectal:  Not performed.  Msk:  Symmetrical without gross deformities.  Extremities:  Without edema. Neurologic:  Alert and  oriented x4;  grossly normal neurologically. Skin:  Intact without significant lesions or rashes. Psych:  Alert and cooperative. Normal affect.  LAB RESULTS: Recent Labs    10/15/21 1402 10/15/21 2242 10/16/21 0613  WBC 2.1* 2.7*  --   HGB 8.2* 7.6* 8.3*  HCT 23.7* 22.5* 24.6*  PLT 139* 144*  --    BMET Recent Labs    10/15/21 1402 10/15/21 2242 10/16/21 0613  NA 138 140 144  K 3.0* 2.9* 3.0*  CL 109 111 114*  CO2 '25 24 26  '$ GLUCOSE 90 85 92  BUN '12 10 11  '$ CREATININE 0.88 0.83 0.80  CALCIUM 8.4* 8.1* 8.1*   LFT Recent Labs    10/16/21 0613  PROT 5.8*  ALBUMIN 3.3*  AST 11*  ALT 11  ALKPHOS 34*  BILITOT 0.9   PT/INR Recent Labs    10/15/21 2242 10/16/21 0613  LABPROT 14.2 14.0  INR 1.1 1.1    STUDIES: No results found.   PREVIOUS ENDOSCOPIES:            EGD March 21, 2020 (Dr. Tarri Glenn): Medium sized hiatal hernia, mildly tortuous distal esophagus, otherwise normal  EGD Sep 13, 2021 (Dr. Ardis Hughs): Large hiatal hernia with several linear Cameron's  lesions, mild gastritis   Impression / Plan:   56 year old female with metastatic breast cancer and recent hospitalization for upper GI bleed secondary to Cameron's lesions, who is readmitted for melena, nausea and abdominal discomfort and found to have a two-point drop in hemoglobin.  She received 1 unit PRBC with suboptimal response.  She has not had any further bleeding since she was  admitted yesterday. She apparently misunderstood her discharge instructions last hospitalization and had stopped taking her omeprazole, when she was supposed to be taking it twice a day.  This could be a likely explanation for why she is bleeding again. I discussed with the patient the rationale for repeating an upper endoscopy, which would be to either investigate a another source of bleeding (unlikely) or to provide hemostasis.  Although, I think it is unlikely that she has a high risk bleeding lesion, her suboptimal response to transfusion may suggest that she may need endoscopic hemostasis.  The patient preferred to proceed with a repeat upper endoscopy to exclude other possible etiologies and provide hemostasis if needed. We will proceed with upper endoscopy today.  Melena, acute on chronic anemia - EGD today - Further recommendations following EGD  Thanks   LOS: 1 day   Daryel November  10/16/2021, 8:35 AM

## 2021-10-16 NOTE — Progress Notes (Signed)
Writer had just started IV Feraheme and walked out of room to get the B-12 IM ordered dose. Pt called out that she was having trouble breathing. Writer ran to pt's room and immediately removed IV tubing from the Feraheme. VS gotten. RR called.O2 applied via [place]. MD called to room. Pt was able to converse that she was feeling "really bad". Hypotensive. IV present to start a new IV. New orders received and noted. Pt was transferred to ICU Room 1228.  Writer gave receiving nurse report at bedside in Room 1228. Family present and aware.

## 2021-10-16 NOTE — Transfer of Care (Signed)
Immediate Anesthesia Transfer of Care Note  Patient: Caitlin Greene  Procedure(s) Performed: ESOPHAGOGASTRODUODENOSCOPY (EGD)  Patient Location: Endoscopy Unit  Anesthesia Type:MAC  Level of Consciousness: awake and alert   Airway & Oxygen Therapy: Patient Spontanous Breathing and Patient connected to face mask oxygen  Post-op Assessment: Report given to RN and Post -op Vital signs reviewed and stable  Post vital signs: Reviewed and stable  Last Vitals:  Vitals Value Taken Time  BP    Temp    Pulse    Resp    SpO2      Last Pain:  Vitals:   10/16/21 0902  TempSrc: Tympanic  PainSc: 0-No pain         Complications: No notable events documented.

## 2021-10-16 NOTE — Anesthesia Postprocedure Evaluation (Signed)
Anesthesia Post Note  Patient: Caitlin Greene  Procedure(s) Performed: ESOPHAGOGASTRODUODENOSCOPY (EGD)     Patient location during evaluation: Endoscopy Anesthesia Type: MAC Level of consciousness: awake and alert Pain management: pain level controlled Vital Signs Assessment: post-procedure vital signs reviewed and stable Respiratory status: spontaneous breathing, nonlabored ventilation and respiratory function stable Cardiovascular status: blood pressure returned to baseline and stable Postop Assessment: no apparent nausea or vomiting Anesthetic complications: no   No notable events documented.  Last Vitals:  Vitals:   10/16/21 0627 10/16/21 0902  BP: 106/70 (!) 169/66  Pulse: 82 80  Resp: 18 17  Temp: 36.4 C 36.9 C  SpO2: 99% 98%    Last Pain:  Vitals:   10/16/21 0902  TempSrc: Tympanic  PainSc: 0-No pain                 Lidia Collum

## 2021-10-16 NOTE — Anesthesia Procedure Notes (Signed)
Procedure Name: MAC Date/Time: 10/16/2021 9:10 AM  Performed by: Cynda Familia, CRNAPre-anesthesia Checklist: Patient identified, Emergency Drugs available, Suction available, Patient being monitored and Timeout performed Patient Re-evaluated:Patient Re-evaluated prior to induction Oxygen Delivery Method: Simple face mask Placement Confirmation: positive ETCO2 and breath sounds checked- equal and bilateral

## 2021-10-16 NOTE — Plan of Care (Signed)
Flow coverage note:  Lactic acid 3.6, up from 2.2 and patient with GI bleed  Suspect related to GI bleed though patient does have SIRS criteria We will get procalcitonin to evaluate for likelihood of sepsis 1 L IV fluid bolus If procalcitonin suggesting sepsis we will start antibiotics for sepsis of unknown source

## 2021-10-16 NOTE — Significant Event (Signed)
Rapid Response Event Note   Reason for Call :  Allergic reaction to iron infusion; hypotension and feeling of impending doom  Initial Focused Assessment:  Patient alert in bed, reports feeling "bad" and nauseous; symptoms began within seconds of iron infusion starting. Vomited small amount of bilious fluid. BP low, 67/36, but patient answering questions appropriately and HR 79.  Unable to administer bolus via existing IV d/t inability to draw back iron in pigtail. IV team at bedside attempting US guided IV access. RFA IV briefly established, patient able to receive solumedrol and benadryl. Line infiltrated very quickly after bolus initiated. Second US guided IV placed, bolus restarted.  BP improved slowly, patient remained alert and oriented and was transferred to stepdown.     Interventions:  Provider at bedside; benadryl, solumedrol given. Functional IV access established and NS bolus administered.   Plan of Care:  Transfer to SDU for monitoring   Event Summary:   MD Notified: Prior to RRT arrival Call Time: 1311 Arrival Time: Gervais, RN

## 2021-10-16 NOTE — Op Note (Signed)
[facility] Patient Name: Caitlin Greene Procedure Date: 10/16/2021 MRN: [id number] Attending MD: Gladstone Pih. Candis Schatz , MD Date of Birth: August 12, 1965 CSN: [id number] Age: 56 Admit Type: Outpatient Procedure:                Upper GI endoscopy Indications:              Melena Providers:                Nicki Reaper E. Candis Schatz, MD, Elmer Ramp. Tilden Dome, RN,                            Despina Pole, Technician, Dellie Catholic Referring MD:              Medicines:                Monitored Anesthesia Care Complications:            No immediate complications. Estimated Blood Loss:     Estimated blood loss: none. Procedure:                Pre-Anesthesia Assessment:                           - Prior to the procedure, a History and Physical                            was performed, and patient medications and                            allergies were reviewed. The patient's tolerance of                            previous anesthesia was also reviewed. The risks                            and benefits of the procedure and the sedation                            options and risks were discussed with the patient.                            All questions were answered, and informed consent                            was obtained. Prior Anticoagulants: The patient has                            taken no previous anticoagulant or antiplatelet                            agents. ASA Grade Assessment: II - A patient with                            mild systemic disease. After reviewing the risks  and benefits, the patient was deemed in                            satisfactory condition to undergo the procedure.                           After obtaining informed consent, the endoscope was                            passed under direct vision. Throughout the                            procedure, the patient's blood pressure, pulse, and                            oxygen  saturations were monitored continuously. The                            [id number]-[id number] ([id number]) Olympus endoscope was introduced                            through the mouth, and advanced to the second part                            of duodenum. The upper GI endoscopy was                            accomplished without difficulty. The patient                            tolerated the procedure well. Scope In: Scope Out: Findings:      The lower third of the esophagus was mildly tortuous.      One benign-appearing, intrinsic mild stenosis was found. This stenosis       measured 1.1 cm (inner diameter) x less than one cm (in length).      LA Grade B (one or more mucosal breaks greater than 5 mm, not extending       between the tops of two mucosal folds) esophagitis with no bleeding was       found.      A 9 cm hiatal hernia with multiple linear Cameron ulcers was found       (pictures of ulcers did not capture). There were no bleeding stigmata       (visible vessels, adherent clot). There was no blood or hematin in the       upper GI tract. The proximal extent of the gastric folds (end of tubular       esophagus) was 34 cm from the incisors. The hiatal narrowing was 43 cm       from the incisors. The Z-line was 34 cm from the incisors.      The exam of the stomach was otherwise normal.      The examined duodenum was normal. Impression:               - Tortuous esophagus.                           -  Benign-appearing esophageal stenosis.                           - LA Grade B reflux esophagitis with no bleeding.                           - 9 cm hiatal hernia with multiple Cameron ulcers.                            Endoscopically very similar to her EGD in May                           - Normal examined duodenum.                           - No specimens collected.                           - Suspect patient's melena was secondary to                            bleeding from Cameron's lesions +/-  reflux                            esophagitis, exacerbated in part by lack of acid                            suppression Moderate Sedation:      Not Applicable - Patient had care per Anesthesia. Recommendation:           - Return patient to hospital ward for possible                            discharge same day.                           - Resume previous diet.                           - Use Prilosec (omeprazole) 40 mg PO BID for 6                            weeks, then decrease to once daily indefinitely.                           - Use sucralfate suspension 1 gram PO BID for 2                            weeks.                           - Follow up with Dr. Tarri Glenn in GI as outpatient                           - Consider elective hernia repair/fundoplication if  bleeding from Cameron's lesions becomes a recurrent                            problem despite acid suppression. Procedure Code(s):        --- Professional ---                           [phone], Esophagogastroduodenoscopy, flexible,                            transoral; diagnostic, including collection of                            specimen(s) by brushing or washing, when performed                            (separate procedure) Diagnosis Code(s):        --- Professional ---                           Q39.9, Congenital malformation of esophagus,                            unspecified                           K22.2, Esophageal obstruction                           K21.00, Gastro-esophageal reflux disease with                            esophagitis, without bleeding                           K44.9, Diaphragmatic hernia without obstruction or                            gangrene                           K25.9, Gastric ulcer, unspecified as acute or                            chronic, without hemorrhage or perforation                           K92.1, Melena (includes Hematochezia) CPT copyright 2019  American Medical Association. All rights reserved. The codes documented in this report are preliminary and upon coder review may  be revised to meet current compliance requirements. Yaneliz Radebaugh E. Candis Schatz, MD 10/16/2021 9:38:41 AM This report has been signed electronically. Number of Addenda: 0

## 2021-10-16 NOTE — Progress Notes (Signed)
Hypotension is improved with IV fluid and midodrine.  Obtaining orthostatic vital signs for presyncopal event.  Unclear if this is related to autonomic dysfunction, or adrenal insufficiency, cortisol level is pending.  Will obtain UA, lactic acid, blood cultures x2 peripherally to rule out infection as a cause of her hypotension.  MRSA screening test is positive.  We will continue to closely monitor and treat as indicated.

## 2021-10-16 NOTE — Anesthesia Preprocedure Evaluation (Signed)
Anesthesia Evaluation  Patient identified by MRN, date of birth, ID band Patient awake    Reviewed: Allergy & Precautions, NPO status , Patient's Chart, lab work & pertinent test results  History of Anesthesia Complications Negative for: history of anesthetic complications  Airway Mallampati: II  TM Distance: >3 FB Neck ROM: Full    Dental   Pulmonary neg pulmonary ROS,    Pulmonary exam normal        Cardiovascular negative cardio ROS Normal cardiovascular exam     Neuro/Psych Anxiety Depression negative neurological ROS     GI/Hepatic Neg liver ROS, GERD  ,Suspected UGIB   Endo/Other  negative endocrine ROS  Renal/GU negative Renal ROS  negative genitourinary   Musculoskeletal negative musculoskeletal ROS (+)   Abdominal   Peds  Hematology  (+) Blood dyscrasia, anemia ,   Anesthesia Other Findings Recurrent metastatic breast cancer  Reproductive/Obstetrics                             Anesthesia Physical Anesthesia Plan  ASA: 3 and emergent  Anesthesia Plan: MAC   Post-op Pain Management: Minimal or no pain anticipated   Induction: Intravenous  PONV Risk Score and Plan: 2 and Propofol infusion, TIVA and Treatment may vary due to age or medical condition  Airway Management Planned: Natural Airway, Nasal Cannula and Simple Face Mask  Additional Equipment: None  Intra-op Plan:   Post-operative Plan:   Informed Consent: I have reviewed the patients History and Physical, chart, labs and discussed the procedure including the risks, benefits and alternatives for the proposed anesthesia with the patient or authorized representative who has indicated his/her understanding and acceptance.       Plan Discussed with:   Anesthesia Plan Comments:         Anesthesia Quick Evaluation

## 2021-10-16 NOTE — Progress Notes (Signed)
 PROGRESS NOTE  [name] MRN:[id number] DOB: 05/19/1965 DOA: 10/15/2021 PCP: John, James W, MD  HPI/Recap of past 24 hours: Caitlin Greene is a 56 y.o. female with medical history significant of hyperlipidemia, chronic macrocytic anemia, chronic anxiety/depression, GERD, breast cancer, history of GI bleed with recent admission, who presented to the ED with increase fatigue over the last few days and dark stools that started 24 hour prior to presentation.  Associated with intermittent epigastric pain.  She became concerned due to continued fatigue recurrent black stools as well as feels of presyncope.  She denies any anginal symptoms.  10/16/21: The patient went into acute distress the afternoon of 10/16/2021 after initiation of IV Feraheme.  She became severely hypotensive with nausea and dry heave.  Her heart rate was within normal range.  She does have a hx of syncope as a teenager and of presyncope which she presented with.  Due to concern for allergic reaction she received IV Solumedrol 125 mg x1, IV pepcid 20 mg x1 and IV benadryl 25 mg x1.  currently on IV fluid.  The only IV access we were able to obtain was in her RUE which is restricted.  Transferred to stepdown unit.  Appreciate IV team assistance with IV placement.    Assessment/Plan: Principal Problem:   GI bleed  Recurrent GI bleed. Recent admission for the same last month Seen by GI, post EGD Findings on EGD: Cameron's ulcers and reflux esophagitis.  No high risk lesions.   Per GI, suspect this was the bleeding source.   GI recommends PO PPI BID x 6 weeks, BID Carafate x 2 weeks.   Presyncope/severe hypotension Presyncopal episode the afternoon of 10/16/21 after initiation of IV Feraheme With severe hypotension with MAP in the 40s to 50s. IV fluid hydration Obtain orthostatic vital signs, twelve-lead EKG.   Obtain random cortisol level Check H&H, transfuse hemoglobin less than 8 for symptomatic anemia  Acute hypoxic  respiratory failure Not on oxygen supplementation at baseline Currently on 4 L nasal cannula Obtain chest x-ray to further assess Incentive spirometer, flutter valve Maintain O2 saturation greater than 90%  Questionable IV Feraheme allergy Reaction/clinical status change with initiation of IV Feraheme She has had IV Feraheme in the past-no rashes Received IV Solu-Medrol, Pepcid, Benadryl, as stated above.  Hypokalemia  Repleted intravenously Repeat chemistry panel and magnesium level   B12 deficiency Supplement as needed   Subclinical hypothyroidism Free T4  0.80 With TSH of 4.846   Hx of BRCA  -Patient currently being treated for breast cancer with mets to the bone - Followed by Dr. Ennever   GERD Continue PPI as recommended by GI.   Chronic anxiety/depression Resume home regimen.    Critical care time: 65 minutes.       DVT prophylaxis: scds due to concern for GI bleed Code Status: full Family Communication: Sister at bedside. Disposition Plan: The patient requires at least 2 midnights for further evaluation and treatment of present condition. Consults called: GI. Admission status: Transferred to stepdown unit from progressive unit.       Objective: Vitals:   10/16/21 0940 10/16/21 1004 10/16/21 1314 10/16/21 1403  BP: (!) 146/66 114/73 (!) 67/36   Pulse: 88 78    Resp: 17 18 (!) 27   Temp:  (!) 97.4 F (36.3 C)  97.8 F (36.6 C)  TempSrc:  Oral  Oral  SpO2: 95% 99%  99%  Weight:      Height:          Intake/Output Summary (Last 24 hours) at 10/16/2021 1408 Last data filed at 10/16/2021 0932 Gross per 24 hour  Intake 974 ml  Output 0 ml  Net 974 ml   Filed Weights   10/15/21 1319 10/16/21 0902  Weight: 89.4 kg 89.4 kg    Exam:  General: 56 y.o. year-old female well developed well nourished in no acute distress.  Alert and oriented x3.  Appears uncomfortable due to nausea. Cardiovascular: Regular rate and rhythm with no rubs or gallops.  No  thyromegaly or JVD noted.   Respiratory: Clear to auscultation with no wheezes or rales.  Poor inspiratory effort. Abdomen: Soft nontender nondistended with normal bowel sounds x4 quadrants. Musculoskeletal: No lower extremity edema. 2/4 pulses in all 4 extremities. Skin: No ulcerative lesions noted or rashes, Psychiatry: Mood is appropriate for condition and setting   Data Reviewed: CBC: Recent Labs  Lab 10/15/21 1402 10/15/21 2242 10/16/21 0613 10/16/21 1306  WBC 2.1* 2.7*  --   --   NEUTROABS 1.3* 1.4*  --   --   HGB 8.2* 7.6* 8.3* 9.1*  HCT 23.7* 22.5* 24.6* 27.2*  MCV 105.3* 108.7*  --   --   PLT 139* 144*  --   --    Basic Metabolic Panel: Recent Labs  Lab 10/15/21 1402 10/15/21 2242 10/16/21 0613  NA 138 140 144  K 3.0* 2.9* 3.0*  CL 109 111 114*  CO2 25 24 26  GLUCOSE 90 85 92  BUN 12 10 11  CREATININE 0.88 0.83 0.80  CALCIUM 8.4* 8.1* 8.1*  MG  --  1.9  --    GFR: Estimated Creatinine Clearance: 90.8 mL/min (by C-G formula based on SCr of 0.8 mg/dL). Liver Function Tests: Recent Labs  Lab 10/15/21 1402 10/15/21 2242 10/16/21 0613  AST 12* 13* 11*  ALT 10 11 11  ALKPHOS 37* 30* 34*  BILITOT 0.6 0.7 0.9  PROT 6.3* 5.7* 5.8*  ALBUMIN 3.5 3.2* 3.3*   Recent Labs  Lab 10/15/21 1402  LIPASE 24   No results for input(s): "AMMONIA" in the last 168 hours. Coagulation Profile: Recent Labs  Lab 10/15/21 2242 10/16/21 0613  INR 1.1 1.1   Cardiac Enzymes: No results for input(s): "CKTOTAL", "CKMB", "CKMBINDEX", "TROPONINI" in the last 168 hours. BNP (last 3 results) No results for input(s): "PROBNP" in the last 8760 hours. HbA1C: Recent Labs    10/15/21 2242  HGBA1C 4.3*   CBG: Recent Labs  Lab 10/16/21 0756  GLUCAP 94   Lipid Profile: No results for input(s): "CHOL", "HDL", "LDLCALC", "TRIG", "CHOLHDL", "LDLDIRECT" in the last 72 hours. Thyroid Function Tests: Recent Labs    10/15/21 2242  TSH 4.846*  FREET4 0.80   Anemia  Panel: Recent Labs    10/15/21 2242  VITAMINB12 179*  FOLATE 16.3  FERRITIN 21  TIBC 332  IRON 50  RETICCTPCT 2.9   Urine analysis:    Component Value Date/Time   COLORURINE YELLOW 04/12/2018 1413   APPEARANCEUR CLEAR 04/12/2018 1413   LABSPEC 1.025 04/12/2018 1413   PHURINE 6.5 04/12/2018 1413   GLUCOSEU NEGATIVE 04/12/2018 1413   HGBUR NEGATIVE 04/12/2018 1413   BILIRUBINUR negative 07/06/2019 1507   BILIRUBINUR 1 12/20/2014 1007   KETONESUR negative 07/06/2019 1507   KETONESUR NEGATIVE 04/12/2018 1413   PROTEINUR negative 07/06/2019 1507   PROTEINUR neg 12/20/2014 1007   UROBILINOGEN 1.0 07/06/2019 1507   UROBILINOGEN 0.2 04/12/2018 1413   NITRITE Negative 07/06/2019 1507   NITRITE NEGATIVE 04/12/2018 1413     LEUKOCYTESUR Negative 07/06/2019 1507   Sepsis Labs: _0 (procalcitonin:4,lacticidven:4)  )No results found for this or any previous visit (from the past 240 hour(s)).    Studies: No results found.  Scheduled Meds:  sodium chloride   Intravenous Once   sodium chloride   Intravenous Once   Chlorhexidine Gluconate Cloth  6 each Topical Daily   cyanocobalamin  1,000 mcg Intramuscular Daily   diphenhydrAMINE       diphenhydrAMINE  25 mg Intravenous STAT   FLUoxetine  60 mg Oral Daily   folic acid  1 mg Oral Daily   methylPREDNISolone (SOLU-MEDROL) injection  125 mg Intravenous STAT   methylPREDNISolone sodium succinate       methylPREDNISolone sodium succinate       midodrine  10 mg Oral STAT    morphine injection  4 mg Intravenous Once   pantoprazole (PROTONIX) IV  40 mg Intravenous Q12H   sucralfate  1 g Oral TID WC & HS   vitamin B-12  1,000 mcg Oral Daily    Continuous Infusions:  sodium chloride 75 mL/hr at 10/16/21 1302   famotidine     famotidine (PEPCID) IV     potassium chloride     sodium chloride       LOS: 1 day     Kayleen Memos, MD [facility] Pager [phone]  If 7PM-7AM, please contact  night-coverage [email or web address] Password [email or web address] 10/16/2021, 2:08 PM

## 2021-10-16 NOTE — Plan of Care (Signed)
Discussed with patient plan of care for the evening, pain management and night time medications with some teach back displayed.  Problem: Education: Goal: Knowledge of General Education information will improve Description: Including pain rating scale, medication(s)/side effects and non-pharmacologic comfort measures Outcome: Progressing   Problem: Health Behavior/Discharge Planning: Goal: Ability to manage health-related needs will improve Outcome: Progressing

## 2021-10-17 DIAGNOSIS — K449 Diaphragmatic hernia without obstruction or gangrene: Secondary | ICD-10-CM

## 2021-10-17 DIAGNOSIS — K921 Melena: Secondary | ICD-10-CM | POA: Diagnosis not present

## 2021-10-17 DIAGNOSIS — K257 Chronic gastric ulcer without hemorrhage or perforation: Secondary | ICD-10-CM | POA: Diagnosis not present

## 2021-10-17 DIAGNOSIS — D500 Iron deficiency anemia secondary to blood loss (chronic): Secondary | ICD-10-CM

## 2021-10-17 DIAGNOSIS — K922 Gastrointestinal hemorrhage, unspecified: Secondary | ICD-10-CM | POA: Diagnosis not present

## 2021-10-17 LAB — CBC WITH DIFFERENTIAL/PLATELET
Abs Immature Granulocytes: 0.03 10*3/uL (ref 0.00–0.07)
Basophils Absolute: 0 10*3/uL (ref 0.0–0.1)
Basophils Relative: 0 %
Eosinophils Absolute: 0 10*3/uL (ref 0.0–0.5)
Eosinophils Relative: 0 %
HCT: 23.7 % — ABNORMAL LOW (ref 36.0–46.0)
Hemoglobin: 7.9 g/dL — ABNORMAL LOW (ref 12.0–15.0)
Immature Granulocytes: 1 %
Lymphocytes Relative: 6 %
Lymphs Abs: 0.2 10*3/uL — ABNORMAL LOW (ref 0.7–4.0)
MCH: 35 pg — ABNORMAL HIGH (ref 26.0–34.0)
MCHC: 33.3 g/dL (ref 30.0–36.0)
MCV: 104.9 fL — ABNORMAL HIGH (ref 80.0–100.0)
Monocytes Absolute: 0.1 10*3/uL (ref 0.1–1.0)
Monocytes Relative: 2 %
Neutro Abs: 3 10*3/uL (ref 1.7–7.7)
Neutrophils Relative %: 91 %
Platelets: 126 10*3/uL — ABNORMAL LOW (ref 150–400)
RBC: 2.26 MIL/uL — ABNORMAL LOW (ref 3.87–5.11)
RDW: 19.8 % — ABNORMAL HIGH (ref 11.5–15.5)
WBC: 3.3 10*3/uL — ABNORMAL LOW (ref 4.0–10.5)
nRBC: 0 % (ref 0.0–0.2)

## 2021-10-17 LAB — PROCALCITONIN: Procalcitonin: 0.15 ng/mL

## 2021-10-17 LAB — LACTIC ACID, PLASMA: Lactic Acid, Venous: 0.8 mmol/L (ref 0.5–1.9)

## 2021-10-17 LAB — COMPREHENSIVE METABOLIC PANEL WITH GFR
ALT: 13 U/L (ref 0–44)
AST: 14 U/L — ABNORMAL LOW (ref 15–41)
Albumin: 3.2 g/dL — ABNORMAL LOW (ref 3.5–5.0)
Alkaline Phosphatase: 32 U/L — ABNORMAL LOW (ref 38–126)
Anion gap: 4 — ABNORMAL LOW (ref 5–15)
BUN: 9 mg/dL (ref 6–20)
CO2: 21 mmol/L — ABNORMAL LOW (ref 22–32)
Calcium: 7.9 mg/dL — ABNORMAL LOW (ref 8.9–10.3)
Chloride: 118 mmol/L — ABNORMAL HIGH (ref 98–111)
Creatinine, Ser: 0.7 mg/dL (ref 0.44–1.00)
GFR, Estimated: >60 mL/min
Glucose, Bld: 161 mg/dL — ABNORMAL HIGH (ref 70–99)
Potassium: 4.5 mmol/L (ref 3.5–5.1)
Sodium: 143 mmol/L (ref 135–145)
Total Bilirubin: 0.8 mg/dL (ref 0.3–1.2)
Total Protein: 5.5 g/dL — ABNORMAL LOW (ref 6.5–8.1)

## 2021-10-17 LAB — MAGNESIUM: Magnesium: 2.4 mg/dL (ref 1.7–2.4)

## 2021-10-17 LAB — GLUCOSE, CAPILLARY: Glucose-Capillary: 129 mg/dL — ABNORMAL HIGH (ref 70–99)

## 2021-10-17 MED ORDER — VANCOMYCIN HCL 1750 MG/350ML IV SOLN
1750.0000 mg | Freq: Once | INTRAVENOUS | Status: AC
Start: 2021-10-17 — End: 2021-10-17
  Administered 2021-10-17: 1750 mg via INTRAVENOUS
  Filled 2021-10-17: qty 350

## 2021-10-17 MED ORDER — VANCOMYCIN HCL IN DEXTROSE 1-5 GM/200ML-% IV SOLN
1000.0000 mg | Freq: Two times a day (BID) | INTRAVENOUS | Status: DC
  Administered 2021-10-17 – 2021-10-19 (×4): 1000 mg via INTRAVENOUS
  Filled 2021-10-17 (×4): qty 200

## 2021-10-17 MED ORDER — MAGNESIUM HYDROXIDE 400 MG/5ML PO SUSP
300.0000 mL | Freq: Once | ORAL | Status: AC
  Administered 2021-10-17: 300 mL via RECTAL
  Filled 2021-10-17: qty 90

## 2021-10-17 MED ORDER — PANTOPRAZOLE SODIUM 40 MG PO TBEC
40.0000 mg | DELAYED_RELEASE_TABLET | Freq: Two times a day (BID) | ORAL | Status: DC
  Administered 2021-10-17 – 2021-10-21 (×8): 40 mg via ORAL
  Filled 2021-10-17 (×8): qty 1

## 2021-10-17 MED ORDER — POLYETHYLENE GLYCOL 3350 17 G PO PACK
17.0000 g | PACK | Freq: Every day | ORAL | Status: DC
  Administered 2021-10-17: 17 g via ORAL
  Filled 2021-10-17 (×3): qty 1

## 2021-10-17 MED ORDER — SENNOSIDES-DOCUSATE SODIUM 8.6-50 MG PO TABS
2.0000 | ORAL_TABLET | Freq: Two times a day (BID) | ORAL | Status: DC
  Administered 2021-10-17 – 2021-10-21 (×3): 2 via ORAL
  Filled 2021-10-17 (×7): qty 2

## 2021-10-17 MED ORDER — SODIUM CHLORIDE 0.9 % IV SOLN
2.0000 g | Freq: Three times a day (TID) | INTRAVENOUS | Status: DC
  Administered 2021-10-17 – 2021-10-19 (×7): 2 g via INTRAVENOUS
  Filled 2021-10-17 (×7): qty 12.5

## 2021-10-17 MED ORDER — MIDODRINE HCL 5 MG PO TABS
10.0000 mg | ORAL_TABLET | Freq: Three times a day (TID) | ORAL | Status: DC
  Administered 2021-10-17 (×3): 10 mg via ORAL
  Filled 2021-10-17 (×4): qty 2

## 2021-10-17 NOTE — Progress Notes (Signed)
PROGRESS NOTE  Caitlin Greene WJX:[id number] DOB: [birth year] DOA: 10/15/2021 PCP: Biagio Borg, MD  HPI/Recap of past 24 hours: Caitlin Greene is a 56 y.o. female with medical history significant of hyperlipidemia, chronic macrocytic anemia, chronic anxiety/depression, GERD, metastatic breast cancer with bone only metastasis, history of GI bleed with recent admission, who presented to the ED with increase fatigue over the last few days and dark stools that started 24 hour prior to presentation.  Associated with intermittent epigastric pain.  She became concerned due to continued fatigue recurrent black stools as well as feeling of presyncope.  She denies any anginal symptoms.  The patient went into acute distress the afternoon of 10/16/2021 after initiation of IV Feraheme.  She became severely hypotensive with nausea and dry heave.  She was transferred to stepdown unit.  Further work-up revealed elevated lactic acid up to 3.6, leukopenia 1.9, further severe hypotension, requiring 10 mg midodrine 3 times daily.  Blood cultures were collected and the patient was started on IV antibiotics vancomycin and cefepime empirically until active infective process was ruled out.  10/17/2021: The patient was seen and examined at bedside.  She feels better this morning.  Her white blood cell count is uptrending from 1.9 to 3.3.  Hemoglobin has dropped to 7.9 from 9.3.  No overt bleeding reported.  No bowel movements for the past 2 days.  Assessment/Plan: Principal Problem:   GI bleed  Recurrent GI bleed. Recent admission for the same last month Seen by GI, post EGD Findings on EGD: Cameron's ulcers and reflux esophagitis.  No high risk lesions.   Per GI, suspect this was the bleeding source.   GI recommends PO PPI BID x 6 weeks, BID Carafate x 2 weeks.  Switch IV PPI to po PPI Continue carafate as recommended by GI  Severe hypotension Lactic acidosis Possible sepsis of unclear etiology Presyncopal  episode the afternoon of 10/16/21 after initiation of IV Feraheme With severe hypotension with MAP in the 40s to 50s. IV fluid hydration; orthostatic vital signs were positive.  Blood cultures obtained peripherally on 10/16/2021, continue to follow Random cortisol level was normal. MRSA screening test positive Possible intra-abdominal infection, started on cefepime and IV vancomycin on 10/17/2021  Resolved acute hypoxic respiratory failure likely secondary to atelectasis Personally reviewed chest x-ray done on 1623 which was nonacute. Continue incentive spirometer and flutter valve. Currently on room air with O2 saturation of 98%.  Questionable IV Feraheme allergy Reaction/clinical status change with initiation of IV Feraheme She has had IV Feraheme in the past-no rashes Received IV Solu-Medrol, Pepcid, Benadryl, as stated above.  Resolved post repletion: Hypokalemia  Serum potassium 4.5, serum magnesium 2.4.   B12 deficiency Vitamin B12 level 179. Supplement as needed   Subclinical hypothyroidism Free T4  0.80 With TSH of 4.846   Hx of BRCA  -Patient currently being treated for breast cancer with mets to the bone - Followed by Dr. Marin Olp   GERD Continue PPI as recommended by GI.   Chronic anxiety/depression Resume home regimen.      DVT prophylaxis: scds due to concern for GI bleed Code Status: full Family Communication: Sister at bedside. Disposition Plan: The patient requires at least 2 midnights for further evaluation and treatment of present condition. Consults called: GI. Admission status: Transferred to stepdown unit from progressive unit on 10/16/2021.       Objective: Vitals:   10/17/21 0700 10/17/21 0748 10/17/21 0800 10/17/21 1100  BP: 102/63  137/73 139/75  Pulse: 69  83   Resp: 16  19 (!) 22  Temp:  97.8 F (36.6 C)    TempSrc:  Oral    SpO2: 91%  98% 98%  Weight:      Height:        Intake/Output Summary (Last 24 hours) at 10/17/2021  1225 Last data filed at 10/17/2021 0956 Gross per 24 hour  Intake 4272.11 ml  Output 2900 ml  Net 1372.11 ml   Filed Weights   10/15/21 1319 10/16/21 0902  Weight: 89.4 kg 89.4 kg    Exam:  General: 56 y.o. year-old female well-developed well-nourished no acute distress.  She is alert oriented x3.   Cardiovascular: Regular rate and rhythm no rubs or gallops.   Respiratory: Clear to station no wheeze or rales.   Abdomen: Nontender not distended bowel sounds present.   Musculoskeletal: No lower extremity edema bilaterally. Skin: No ulcerative lesions noted.   Psychiatry: Mood is appropriate for condition.   Data Reviewed: CBC: Recent Labs  Lab 10/15/21 1402 10/15/21 2242 10/16/21 0613 10/16/21 1306 10/16/21 1703 10/17/21 0530  WBC 2.1* 2.7*  --   --  1.9* 3.3*  NEUTROABS 1.3* 1.4*  --   --  1.6* 3.0  HGB 8.2* 7.6* 8.3* 9.1* 9.3* 7.9*  HCT 23.7* 22.5* 24.6* 27.2* 27.7* 23.7*  MCV 105.3* 108.7*  --   --  103.4* 104.9*  PLT 139* 144*  --   --  136* 366*   Basic Metabolic Panel: Recent Labs  Lab 10/15/21 1402 10/15/21 2242 10/16/21 0613 10/16/21 1703 10/17/21 0517  NA 138 140 144 140 143  K 3.0* 2.9* 3.0* 3.9 4.5  CL 109 111 114* 114* 118*  CO2 _0 21*  GLUCOSE 90 85 92 156* 161*  BUN _1 CREATININE 0.88 0.83 0.80 0.88 0.70  CALCIUM 8.4* 8.1* 8.1* 7.8* 7.9*  MG  --  1.9  --  2.7* 2.4  PHOS  --   --   --  1.1*  --    GFR: Estimated Creatinine Clearance: 90.8 mL/min (by C-G formula based on SCr of 0.7 mg/dL). Liver Function Tests: Recent Labs  Lab 10/15/21 1402 10/15/21 2242 10/16/21 0613 10/16/21 1703 10/17/21 0517  AST 12* 13* 11* 16 14*  ALT _2 ALKPHOS 37* 30* 34* 37* 32*  BILITOT 0.6 0.7 0.9 0.9 0.8  PROT 6.3* 5.7* 5.8* 6.0* 5.5*  ALBUMIN 3.5 3.2* 3.3* 3.4* 3.2*   Recent Labs  Lab 10/15/21 1402  LIPASE 24   No results for input(s): "AMMONIA" in the last 168 hours. Coagulation Profile: Recent Labs  Lab  10/15/21 2242 10/16/21 0613  INR 1.1 1.1   Cardiac Enzymes: No results for input(s): "CKTOTAL", "CKMB", "CKMBINDEX", "TROPONINI" in the last 168 hours. BNP (last 3 results) No results for input(s): "PROBNP" in the last 8760 hours. HbA1C: Recent Labs    10/15/21 2242  HGBA1C 4.3*   CBG: Recent Labs  Lab 10/16/21 0756 10/17/21 0752  GLUCAP 94 129*   Lipid Profile: No results for input(s): "CHOL", "HDL", "LDLCALC", "TRIG", "CHOLHDL", "LDLDIRECT" in the last 72 hours. Thyroid Function Tests: Recent Labs    10/15/21 2242  TSH 4.846*  FREET4 0.80   Anemia Panel: Recent Labs    10/15/21 2242  VITAMINB12 179*  FOLATE 16.3  FERRITIN 21  TIBC 332  IRON 50  RETICCTPCT 2.9   Urine analysis:    Component Value Date/Time   COLORURINE COLORLESS (A)  10/16/2021 [place] 10/16/2021 1827   LABSPEC 1.005 10/16/2021 1827   PHURINE 5.0 10/16/2021 1827   GLUCOSEU 50 (A) 10/16/2021 1827   GLUCOSEU NEGATIVE 04/12/2018 1413   HGBUR SMALL (A) 10/16/2021 1827   BILIRUBINUR NEGATIVE 10/16/2021 1827   BILIRUBINUR negative 07/06/2019 1507   BILIRUBINUR 1 12/20/2014 1007   KETONESUR NEGATIVE 10/16/2021 1827   PROTEINUR NEGATIVE 10/16/2021 1827   UROBILINOGEN 1.0 07/06/2019 1507   UROBILINOGEN 0.2 04/12/2018 1413   NITRITE NEGATIVE 10/16/2021 1827   LEUKOCYTESUR NEGATIVE 10/16/2021 1827   Sepsis Labs: _0 (procalcitonin:4,lacticidven:4)  ) Recent Results (from the past 240 hour(s))  MRSA Next Gen by PCR, Nasal     Status: Abnormal   Collection Time: 10/16/21  2:07 PM   Specimen: Nasal Mucosa; Nasal Swab  Result Value Ref Range Status   MRSA by PCR Next Gen DETECTED (A) NOT DETECTED Final    Comment: CALLED KOONTZ,A _1  ON 10/16/21 BY LUZOLOP (NOTE) The GeneXpert MRSA Assay (FDA approved for NASAL specimens only), is one component of a comprehensive MRSA colonization surveillance program. It is not intended to diagnose MRSA infection nor to guide or  monitor treatment for MRSA infections. Test performance is not FDA approved in patients less than 65 years old. Performed at [facility], [place] [address], [place], [place] [address]       Studies: [place] EKG SITE RITE  Result Date: 10/16/2021 If Site Rite image not attached, placement could not be confirmed due to current cardiac rhythm.  DG CHEST PORT 1 VIEW  Result Date: 10/16/2021 CLINICAL DATA:  Hypoxia EXAM: PORTABLE CHEST 1 VIEW COMPARISON:  09/12/2021 FINDINGS: Cardiac size is within normal limits. There are no signs of pulmonary edema. Increased density in the retrocardiac region may be due to hiatal hernia. There is no pleural effusion or pneumothorax. IMPRESSION: There are no signs of pulmonary edema or focal pulmonary consolidation. Electronically Signed   By: Elmer Picker M.D.   On: 10/16/2021 14:27    Scheduled Meds:  sodium chloride   Intravenous Once   sodium chloride   Intravenous Once   Chlorhexidine Gluconate Cloth  6 each Topical Daily   cyanocobalamin  1,000 mcg Intramuscular Daily   FLUoxetine  60 mg Oral Daily   folic acid  1 mg Oral Daily   midodrine  10 mg Oral TID WC    morphine injection  4 mg Intravenous Once   mupirocin ointment  1 application  Nasal BID   pantoprazole (PROTONIX) IV  40 mg Intravenous Q12H   polyethylene glycol  17 g Oral Daily   sucralfate  1 g Oral TID WC & HS   vitamin B-12  1,000 mcg Oral Daily    Continuous Infusions:  sodium chloride 75 mL/hr at 10/17/21 0600   ceFEPime (MAXIPIME) IV Stopped (10/17/21 2800)   vancomycin       LOS: 2 days     Kayleen Memos, MD [facility] Pager [phone]  If 7PM-7AM, please contact night-coverage [email or web address] Password [facility] 10/17/2021, 12:25 PM

## 2021-10-17 NOTE — Progress Notes (Signed)
Gastroenterology Inpatient Follow-up Note   PATIENT IDENTIFICATION  Nancylee Gaines is a 56 y.o. female Hospital Day: 3  SUBJECTIVE  Patient chart and labs reviewed. Looks like patient ended up having a potential IV iron infusion reaction for which it was stopped.  She improved.  However over the course the evening she had an elevated lactate although procalcitonin was normal due to the concern of potential seizures she received IV fluids and antibiotics. Patient has not had a bowel movement in 2 days and is feeling better than on admission. The patient hemogram has down trended but again no overt bleeding from above or below has been noted. She is wondering about the long-term issues and recurrence of bleeding from the hiatal hernia and Cameron's erosions/ulcers.   OBJECTIVE  Scheduled Inpatient Medications:   sodium chloride   Intravenous Once   sodium chloride   Intravenous Once   Chlorhexidine Gluconate Cloth  6 each Topical Daily   cyanocobalamin  1,000 mcg Intramuscular Daily   FLUoxetine  60 mg Oral Daily   folic acid  1 mg Oral Daily   midodrine  10 mg Oral TID WC    morphine injection  4 mg Intravenous Once   mupirocin ointment  1 application  Nasal BID   pantoprazole (PROTONIX) IV  40 mg Intravenous Q12H   polyethylene glycol  17 g Oral Daily   sucralfate  1 g Oral TID WC & HS   vitamin B-12  1,000 mcg Oral Daily   Continuous Inpatient Infusions:   sodium chloride 75 mL/hr at 10/17/21 0600   ceFEPime (MAXIPIME) IV Stopped (10/17/21 8786)   vancomycin     PRN Inpatient Medications: acetaminophen **OR** acetaminophen, albuterol, hydrOXYzine, oxyCODONE, prochlorperazine   Physical Examination  Temp:  [97.8 F (36.6 C)-98.6 F (37 C)] 97.8 F (36.6 C) (06/11 0748) Pulse Rate:  [65-110] 83 (06/11 0800) Resp:  [10-30] 22 (06/11 1100) BP: (67-139)/(36-82) 139/75 (06/11 1100) SpO2:  [91 %-100 %] 98 % (06/11 1100) Temp (24hrs), Avg:98 F (36.7 C), Min:97.8 F (36.6  C), Max:98.6 F (37 C)  Weight: 89.4 kg GEN: Fatigued, appears older than stated age, nontoxic  PSYCH: Cooperative, without pressured speech EYE: Conjunctivae pale-pink ENT: Dry MM CV: Nontachycardic RESP: No audible wheezing GI: NABS, soft, mildly protuberant abdomen, nontender MSK/EXT: Trace bilateral pedal edema SKIN: Pale skin, no jaundice NEURO:  Alert & Oriented x 3, no focal deficits   Review of Data   Laboratory Studies   Recent Labs  Lab 10/16/21 1703 10/17/21 0517  NA 140 143  K 3.9 4.5  CL 114* 118*  CO2 22 21*  BUN 9 9  CREATININE 0.88 0.70  GLUCOSE 156* 161*  CALCIUM 7.8* 7.9*  MG 2.7* 2.4  PHOS 1.1*  --    Recent Labs  Lab 10/17/21 0517  AST 14*  ALT 13  ALKPHOS 32*    Recent Labs  Lab 10/15/21 2242 10/16/21 0613 10/16/21 1703 10/17/21 0530  WBC 2.7*  --  1.9* 3.3*  HGB 7.6*   < > 9.3* 7.9*  HCT 22.5*   < > 27.7* 23.7*  PLT 144*  --  136* 126*   < > = values in this interval not displayed.   Recent Labs  Lab 10/15/21 2242 10/16/21 0613  INR 1.1 1.1    Imaging Studies  [place] EKG SITE RITE  Result Date: 10/16/2021 If Site Rite image not attached, placement could not be confirmed due to current cardiac rhythm.  DG CHEST PORT  1 VIEW  Result Date: 10/16/2021 CLINICAL DATA:  Hypoxia EXAM: PORTABLE CHEST 1 VIEW COMPARISON:  09/12/2021 FINDINGS: Cardiac size is within normal limits. There are no signs of pulmonary edema. Increased density in the retrocardiac region may be due to hiatal hernia. There is no pleural effusion or pneumothorax. IMPRESSION: There are no signs of pulmonary edema or focal pulmonary consolidation. Electronically Signed   By: Elmer Picker M.D.   On: 10/16/2021 14:27    GI Procedures and Studies  EGD - Tortuous esophagus. - Benign-appearing esophageal stenosis. - LA Grade B reflux esophagitis with no bleeding. - 9 cm hiatal hernia with multiple Cameron ulcers. Endoscopically very similar to her EGD in May -  Normal examined duodenum. - No specimens collected. - Suspect patient's melena was secondary to bleeding from Cameron's lesions +/- reflux esophagitis, exacerbated in part by lack of acid suppression   ASSESSMENT  Ms. Mundo is a 56 y.o. female with PMH of metastatic breast cancer, MDD, anxiety, hyperlipidemia, nephrolithiasis, GERD, large hiatal hernia, recurrent Cameron's erosions/ulcers.  Patient hospitalized with recurrent concern of upper GI bleeding and found on endoscopy to have persisting Cameron's erosions/ulcers.  Patient feels somewhat better than yesterday, especially after she had an IV iron transfusion issue.  She has not had a bowel movement in 2 days but feels there is a chance she may go today.  Hemogram has down trended somewhat but she also received fluids yesterday post procedure in the evening due to issues of elevated lactate and from her anaphylactic reaction with intravenous iron.  At this point it is going to be hard for [place] to continue intravenous iron unless hematology feels that there is another type of iron that could be utilized.  The patient will be maintained on IV iron twice daily today and if she is doing well into tomorrow can be transition to p.o. PPI twice daily.  She will continue sucralfate before every meal plus nightly.  Ultimately defer antibiotic coverage to the primary medical service.  We talked briefly about consideration of hiatal hernia repair and I think this will be reasonable for her.  She already has an upcoming clinic visit with Dr. Ardis Hughs and I will touch base with him in our nurse about putting in a referral for [facility] to consider hiatal hernia repair.  All patient questions were answered to the best of my ability, and the patient agrees to the aforementioned plan of action with follow-up as indicated.   PLAN/RECOMMENDATIONS  Trend hemoglobin/hematocrit into tomorrow Today we will give MiraLAX and have as needed Senokot and have as  needed glycerin suppository available IV PPI twice daily through 6/11 and if doing well can transition to p.o. PPI 40 mg twice daily on 6/12 and maintain Carafate 1 g before every meal plus nightly Advance diet as tolerated Ultimately, her etiology of iron deficiency most likely is her Cameron's erosions/ulcers but a follow-up in GI clinic can consider role of diagnostic/screening colonoscopy (would not normally think about this in the setting of her metastatic breast cancer but something to query) We will touch base with primary GI and work on arranging referral for hiatal hernia repair consideration Ultimately defer any further intravenous iron to patient's primary oncologist, Dr. Marin Olp in the future When discharged recommend oral iron once daily (ferrous gluconate or ferrous sulfate)   Please page/call with questions or concerns.   Justice Britain, MD [facility] Office # [phone]    LOS: 2 days  Irving Copas  10/17/2021, 12:10 PM

## 2021-10-17 NOTE — Progress Notes (Signed)
Pharmacy Antibiotic Note  Caitlin Greene is a 56 y.o. female admitted on 10/15/2021 with recurrent GI bleed.  Underwent endoscopy on 6/10.  PMH significant for metastatic breast cancer with met to the bone.  Tonight patient with elevated lactic acid.  Procalcitonin level ordered.  Pharmacy has been consulted for Vancomycin and Cefepime dosing for suspected sepsis.  Plan: Cefepime 2gm IV q8h Vancomycin 1716m IV x 1 followed by Vancomycin 1000 mg IV Q 12 hrs. Goal AUC 400-550.  Expected AUC: 489.7  SCr used: 0.88 Follow renal function F/u culture results and sensivities  Height: 5' 8"  (172.7 cm) Weight: 89.4 kg (197 lb 1.5 oz) IBW/kg (Calculated) : 63.9  Temp (24hrs), Avg:98 F (36.7 C), Min:97.4 F (36.3 C), Max:98.6 F (37 C)  Recent Labs  Lab 10/15/21 1402 10/15/21 2242 10/16/21 0613 10/16/21 1703 10/16/21 1706 10/16/21 1950  WBC 2.1* 2.7*  --  1.9*  --   --   CREATININE 0.88 0.83 0.80 0.88  --   --   LATICACIDVEN  --   --   --   --  2.2* 3.6*    Estimated Creatinine Clearance: 82.5 mL/min (by C-G formula based on SCr of 0.88 mg/dL).    Allergies  Allergen Reactions   Amoxicillin-Pot Clavulanate Hives and Itching   Penicillins Hives, Itching and Other (See Comments)   Codeine Nausea And Vomiting    Needs pre-meds     Antimicrobials this admission: 6/11 Cefepime >>   6/11 Vancomycin >>    Dose adjustments this admission:    Microbiology results: 6/10 BCx:   6/10 MRSA PCR: positive  Thank you for allowing pharmacy to be a part of this patient's care.  PEverette Rank PharmD 10/17/2021 5:27 AM

## 2021-10-18 ENCOUNTER — Encounter (HOSPITAL_COMMUNITY): Payer: Self-pay | Admitting: Gastroenterology

## 2021-10-18 ENCOUNTER — Telehealth: Payer: Self-pay

## 2021-10-18 DIAGNOSIS — D500 Iron deficiency anemia secondary to blood loss (chronic): Secondary | ICD-10-CM | POA: Diagnosis not present

## 2021-10-18 DIAGNOSIS — K921 Melena: Secondary | ICD-10-CM | POA: Diagnosis not present

## 2021-10-18 LAB — CBC WITH DIFFERENTIAL/PLATELET
Abs Immature Granulocytes: 0.08 10*3/uL — ABNORMAL HIGH (ref 0.00–0.07)
Basophils Absolute: 0 10*3/uL (ref 0.0–0.1)
Basophils Relative: 0 %
Eosinophils Absolute: 0 10*3/uL (ref 0.0–0.5)
Eosinophils Relative: 0 %
HCT: 23.2 % — ABNORMAL LOW (ref 36.0–46.0)
Hemoglobin: 7.4 g/dL — ABNORMAL LOW (ref 12.0–15.0)
Immature Granulocytes: 2 %
Lymphocytes Relative: 28 %
Lymphs Abs: 1.1 10*3/uL (ref 0.7–4.0)
MCH: 34.3 pg — ABNORMAL HIGH (ref 26.0–34.0)
MCHC: 31.9 g/dL (ref 30.0–36.0)
MCV: 107.4 fL — ABNORMAL HIGH (ref 80.0–100.0)
Monocytes Absolute: 0.2 10*3/uL (ref 0.1–1.0)
Monocytes Relative: 5 %
Neutro Abs: 2.7 10*3/uL (ref 1.7–7.7)
Neutrophils Relative %: 65 %
Platelets: 117 10*3/uL — ABNORMAL LOW (ref 150–400)
RBC: 2.16 MIL/uL — ABNORMAL LOW (ref 3.87–5.11)
RDW: 20.5 % — ABNORMAL HIGH (ref 11.5–15.5)
WBC: 4.1 10*3/uL (ref 4.0–10.5)
nRBC: 0 % (ref 0.0–0.2)

## 2021-10-18 LAB — PROCALCITONIN: Procalcitonin: <0.1 ng/mL

## 2021-10-18 LAB — COMPREHENSIVE METABOLIC PANEL
ALT: 13 U/L (ref 0–44)
AST: 15 U/L (ref 15–41)
Albumin: 3 g/dL — ABNORMAL LOW (ref 3.5–5.0)
Alkaline Phosphatase: 30 U/L — ABNORMAL LOW (ref 38–126)
Anion gap: 3 — ABNORMAL LOW (ref 5–15)
BUN: 11 mg/dL (ref 6–20)
CO2: 22 mmol/L (ref 22–32)
Calcium: 8.1 mg/dL — ABNORMAL LOW (ref 8.9–10.3)
Chloride: 118 mmol/L — ABNORMAL HIGH (ref 98–111)
Creatinine, Ser: 0.8 mg/dL (ref 0.44–1.00)
GFR, Estimated: >60 mL/min (ref >60)
Glucose, Bld: 116 mg/dL — ABNORMAL HIGH (ref 70–99)
Potassium: 3.8 mmol/L (ref 3.5–5.1)
Sodium: 143 mmol/L (ref 135–145)
Total Bilirubin: 0.7 mg/dL (ref 0.3–1.2)
Total Protein: 5.3 g/dL — ABNORMAL LOW (ref 6.5–8.1)

## 2021-10-18 LAB — HEMOGLOBIN AND HEMATOCRIT, BLOOD
HCT: 31.8 % — ABNORMAL LOW (ref 36.0–46.0)
HCT: 35.1 % — ABNORMAL LOW (ref 36.0–46.0)
Hemoglobin: 10.5 g/dL — ABNORMAL LOW (ref 12.0–15.0)
Hemoglobin: 11.7 g/dL — ABNORMAL LOW (ref 12.0–15.0)

## 2021-10-18 LAB — PHOSPHORUS: Phosphorus: 1.7 mg/dL — ABNORMAL LOW (ref 2.5–4.6)

## 2021-10-18 LAB — GLUCOSE, CAPILLARY: Glucose-Capillary: 79 mg/dL (ref 70–99)

## 2021-10-18 LAB — PREPARE RBC (CROSSMATCH)

## 2021-10-18 MED ORDER — SODIUM CHLORIDE 0.9% IV SOLUTION: Freq: Once | INTRAVENOUS | Status: AC

## 2021-10-18 MED ORDER — PEG-KCL-NACL-NASULF-NA ASC-C 100 G PO SOLR
0.5000 | Freq: Once | ORAL | Status: AC
  Administered 2021-10-18: 100 g via ORAL
  Filled 2021-10-18: qty 1

## 2021-10-18 MED ORDER — PEG-KCL-NACL-NASULF-NA ASC-C 100 G PO SOLR
0.5000 | Freq: Once | ORAL | Status: AC
  Administered 2021-10-18: 100 g via ORAL

## 2021-10-18 MED ORDER — PEG-KCL-NACL-NASULF-NA ASC-C 100 G PO SOLR
1.0000 | Freq: Once | ORAL | Status: DC
Start: 2021-10-18 — End: 2021-10-18

## 2021-10-18 MED ORDER — ONDANSETRON HCL 4 MG/2ML IJ SOLN: 4.0000 mg | Freq: Three times a day (TID) | INTRAMUSCULAR | Status: DC | PRN

## 2021-10-18 MED ORDER — SODIUM CHLORIDE 0.9 % IV SOLN: INTRAVENOUS | Status: AC

## 2021-10-18 NOTE — Progress Notes (Signed)
PROGRESS NOTE  Caitlin Greene RFF:[id number] DOB: 1965/06/13 DOA: 10/15/2021 PCP: Biagio Borg, MD  HPI/Recap of past 24 hours: Caitlin Greene is a 56 y.o. female with medical history significant of hyperlipidemia, chronic macrocytic anemia, chronic anxiety/depression, GERD, metastatic breast cancer with bone only metastasis, history of GI bleed with recent admission, who presented to the ED with increase fatigue over the last few days and dark stools that started 24 hour prior to presentation.  Associated with intermittent epigastric pain.  She became concerned due to continued fatigue recurrent black stools as well as feeling of presyncope.  She denies any anginal symptoms.  The patient went into acute distress the afternoon of 10/16/2021 after initiation of IV Feraheme.  She became severely hypotensive with nausea and dry heave.  She was transferred to stepdown unit.  Further work-up revealed elevated lactic acid up to 3.6, leukopenia 1.9, severe hypotension, requiring 10 mg midodrine 3 times daily.  Blood cultures were collected and the patient was started on IV antibiotics empirically, IV vancomycin and cefepime until an active infective process was ruled out.  Hospital course complicated by acute blood loss anemia with Hg 7.4 K from 7.9 K and 9.3 K, complaints of 2 episodes of melena, 1 on 6/11 and 1 on 6/12 for which GI was re-consulted.  10/18/2021: Reports pain, tenderness on R and L quadrants of her abdomen with palpations.  No significant nausea.  Assessment/Plan: Principal Problem:   GI bleed  Recurrent GI bleed. Recent admission for the same last month Seen by GI, post EGD which showed cameron erosions. Findings on EGD: Cameron's ulcers and reflux esophagitis.  No high risk lesions.   Per GI, suspect this was the bleeding source.   GI recommends PO PPI BID x 6 weeks, BID Carafate x 2 weeks.  Continue po PPI BID and carafate as recommended by GI  Acute blood loss anemia suspect  secondary to GI bleed. Drop in hemoglobin this morning 7.4K from 7.9K from 9.3K Endorses black tarry stools 1 unit PRBCs ordered to be transfused on 10/18/2021 for hemoglobin of 7.4K Seen by GI.  Acute thrombocytopenia Platelet count downtrending 117 from 126. Continue to monitor  Resolved severe hypotension Resolved Lactic acidosis Possible sepsis of unclear etiology Presyncopal episode the afternoon of 10/16/21 after initiation of IV Feraheme With severe hypotension with MAP in the 40s to 50s. IV fluid hydration; orthostatic vital signs were positive.  Blood cultures obtained peripherally on 10/16/2021, negative to date Random cortisol level was normal. MRSA screening test positive Possible intra-abdominal infection, started on cefepime and IV vancomycin on 10/17/2021  Resolved acute hypoxic respiratory failure likely secondary to atelectasis Personally reviewed chest x-ray done on 1623 which was nonacute. Continue incentive spirometer and flutter valve. Currently on room air with O2 saturation of 98%.  Questionable IV Feraheme allergy Reaction/clinical status change with initiation of IV Feraheme She has had IV Feraheme in the past-no rashes Received IV Solu-Medrol, Pepcid, Benadryl, as stated above.  Resolved post repletion: Hypokalemia  Serum potassium 4.5, serum magnesium 2.4.   B12 deficiency Vitamin B12 level 179. Supplement as needed   Subclinical hypothyroidism Free T4  0.80 With TSH of 4.846   Hx of BRCA with metastasis to the bone -Patient currently being treated for breast cancer with mets to the bone - Followed by Dr. Marin Olp   GERD Continue PPI as recommended by GI.   Chronic anxiety/depression Continue home regimen.      DVT prophylaxis: scds due to concern for GI  bleed Code Status: full code Family Communication: None at bedside today Disposition Plan: The patient requires at least 2 midnights for further evaluation and treatment of present  condition. Consults called: GI. Admission status: Transferred to stepdown unit from progressive unit on 10/16/2021.       Objective: Vitals:   10/18/21 0754 10/18/21 0808 10/18/21 0950 10/18/21 1009  BP:  137/86 140/77 123/80  Pulse:  79 80 71  Resp:  _0 Temp: 99 F (37.2 C)  (!) 97.4 F (36.3 C) 97.8 F (36.6 C)  TempSrc: Oral  Axillary Oral  SpO2:  100% 100% 100%  Weight:      Height:        Intake/Output Summary (Last 24 hours) at 10/18/2021 1105 Last data filed at 10/18/2021 1000 Gross per 24 hour  Intake 2941.66 ml  Output --  Net 2941.66 ml   Filed Weights   10/15/21 1319 10/16/21 0902  Weight: 89.4 kg 89.4 kg    Exam:  General: 56 y.o. year-old female pleasant well-developed well-nourished in no acute distress.  She is alert and oriented x3.   Cardiovascular: Regular rate and rhythm with no rubs or gallops. Respiratory: Clear to auscultation with no wheezes or rales. Abdomen: Nontender bowel sounds present. Musculoskeletal: No lower extremity edema bilaterally. Skin: No ulcerative lesions noted. Psychiatry: Mood is appropriate for condition and setting   Data Reviewed: CBC: Recent Labs  Lab 10/15/21 1402 10/15/21 2242 10/16/21 0613 10/16/21 1306 10/16/21 1703 10/17/21 0530 10/18/21 0650  WBC 2.1* 2.7*  --   --  1.9* 3.3* 4.1  NEUTROABS 1.3* 1.4*  --   --  1.6* 3.0 2.7  HGB 8.2* 7.6* 8.3* 9.1* 9.3* 7.9* 7.4*  HCT 23.7* 22.5* 24.6* 27.2* 27.7* 23.7* 23.2*  MCV 105.3* 108.7*  --   --  103.4* 104.9* 107.4*  PLT 139* 144*  --   --  136* 126* 076*   Basic Metabolic Panel: Recent Labs  Lab 10/15/21 2242 10/16/21 0613 10/16/21 1703 10/17/21 0517 10/18/21 0650  NA 140 144 140 143 143  K 2.9* 3.0* 3.9 4.5 3.8  CL 111 114* 114* 118* 118*  CO2 _1 21* 22  GLUCOSE 85 92 156* 161* 116*  BUN _2 CREATININE 0.83 0.80 0.88 0.70 0.80  CALCIUM 8.1* 8.1* 7.8* 7.9* 8.1*  MG 1.9  --  2.7* 2.4  --   PHOS  --   --  1.1*  --  1.7*    GFR: Estimated Creatinine Clearance: 90.8 mL/min (by C-G formula based on SCr of 0.8 mg/dL). Liver Function Tests: Recent Labs  Lab 10/15/21 2242 10/16/21 0613 10/16/21 1703 10/17/21 0517 10/18/21 0650  AST 13* 11* 16 14* 15  ALT _3 ALKPHOS 30* 34* 37* 32* 30*  BILITOT 0.7 0.9 0.9 0.8 0.7  PROT 5.7* 5.8* 6.0* 5.5* 5.3*  ALBUMIN 3.2* 3.3* 3.4* 3.2* 3.0*   Recent Labs  Lab 10/15/21 1402  LIPASE 24   No results for input(s): "AMMONIA" in the last 168 hours. Coagulation Profile: Recent Labs  Lab 10/15/21 2242 10/16/21 0613  INR 1.1 1.1   Cardiac Enzymes: No results for input(s): "CKTOTAL", "CKMB", "CKMBINDEX", "TROPONINI" in the last 168 hours. BNP (last 3 results) No results for input(s): "PROBNP" in the last 8760 hours. HbA1C: Recent Labs    10/15/21 2242  HGBA1C 4.3*   CBG: Recent Labs  Lab 10/16/21 0756 10/17/21 0752 10/18/21 0751  GLUCAP 94  129* 79   Lipid Profile: No results for input(s): "CHOL", "HDL", "LDLCALC", "TRIG", "CHOLHDL", "LDLDIRECT" in the last 72 hours. Thyroid Function Tests: Recent Labs    10/15/21 2242  TSH 4.846*  FREET4 0.80   Anemia Panel: Recent Labs    10/15/21 2242  VITAMINB12 179*  FOLATE 16.3  FERRITIN 21  TIBC 332  IRON 50  RETICCTPCT 2.9   Urine analysis:    Component Value Date/Time   COLORURINE COLORLESS (A) 10/16/2021 1827   APPEARANCEUR CLEAR 10/16/2021 1827   LABSPEC 1.005 10/16/2021 1827   PHURINE 5.0 10/16/2021 1827   GLUCOSEU 50 (A) 10/16/2021 1827   GLUCOSEU NEGATIVE 04/12/2018 1413   HGBUR SMALL (A) 10/16/2021 1827   BILIRUBINUR NEGATIVE 10/16/2021 1827   BILIRUBINUR negative 07/06/2019 1507   BILIRUBINUR 1 12/20/2014 1007   KETONESUR NEGATIVE 10/16/2021 1827   PROTEINUR NEGATIVE 10/16/2021 1827   UROBILINOGEN 1.0 07/06/2019 1507   UROBILINOGEN 0.2 04/12/2018 1413   NITRITE NEGATIVE 10/16/2021 1827   LEUKOCYTESUR NEGATIVE 10/16/2021 1827   Sepsis  Labs: _0 (procalcitonin:4,lacticidven:4)  ) Recent Results (from the past 240 hour(s))  MRSA Next Gen by PCR, Nasal     Status: Abnormal   Collection Time: 10/16/21  2:07 PM   Specimen: Nasal Mucosa; Nasal Swab  Result Value Ref Range Status   MRSA by PCR Next Gen DETECTED (A) NOT DETECTED Final    Comment: CALLED KOONTZ,A _1  ON 10/16/21 BY LUZOLOP (NOTE) The GeneXpert MRSA Assay (FDA approved for NASAL specimens only), is one component of a comprehensive MRSA colonization surveillance program. It is not intended to diagnose MRSA infection nor to guide or monitor treatment for MRSA infections. Test performance is not FDA approved in patients less than 6 years old. Performed at [facility], [place] [address], [place], [place] [address]   Culture, blood (Routine X 2) w Reflex to ID Panel     Status: None (Preliminary result)   Collection Time: 10/16/21  5:25 PM   Specimen: BLOOD  Result Value Ref Range Status   Specimen Description   Final    BLOOD BLOOD RIGHT FOREARM Performed at [facility] [address] [place], [place] [address]    Special Requests   Final    BOTTLES DRAWN AEROBIC ONLY Blood Culture results may not be optimal due to an inadequate volume of blood received in culture bottles Performed at [facility] [address], [place], [place] [address]    Culture   Final    NO GROWTH 2 DAYS Performed at [facility] [address], [place], [place] [address]    Report Status PENDING  Incomplete  Culture, blood (Routine X 2) w Reflex to ID Panel     Status: None (Preliminary result)   Collection Time: 10/16/21  7:50 PM   Specimen: BLOOD  Result Value Ref Range Status   Specimen Description   Final    BLOOD BLOOD LEFT HAND Performed at [facility] [address] [place], [place] [address]    Special Requests   Final    IN PEDIATRIC BOTTLE Blood Culture adequate  volume Performed at [facility] [address] [place], [place] [address]    Culture   Final    NO GROWTH 2 DAYS Performed at [facility] [address] [place], [place] [address]    Report Status PENDING  Incomplete      Studies: No results found.  Scheduled Meds:  sodium chloride   Intravenous Once   sodium chloride   Intravenous Once   Chlorhexidine Gluconate Cloth  6 each Topical Daily   cyanocobalamin  1,000 mcg Intramuscular Daily   FLUoxetine  60 mg Oral Daily   folic acid  1 mg Oral Daily    morphine injection  4 mg Intravenous Once   mupirocin ointment  1 application  Nasal BID   pantoprazole  40 mg Oral BID   peg 3350 powder  0.5 kit Oral Once   And   peg 3350 powder  0.5 kit Oral Once   senna-docusate  2 tablet Oral BID   sucralfate  1 g Oral TID WC & HS   vitamin B-12  1,000 mcg Oral Daily    Continuous Infusions:  sodium chloride 50 mL/hr at 10/18/21 0819   ceFEPime (MAXIPIME) IV Stopped (10/18/21 7989)   vancomycin Stopped (10/18/21 0607)     LOS: 3 days     Kayleen Memos, MD [facility] Pager [phone]  If 7PM-7AM, please contact night-coverage [email or web address] Password [facility] 10/18/2021, 11:05 AM

## 2021-10-18 NOTE — H&P (View-Only) (Signed)
Patient ID: Caitlin Greene, female   DOB: Feb 05, 1966, 56 y.o.   MRN: [id number]    Progress Note   Subjective   Day # 3 CC; GI bleed, melena  Labs today-hemoglobin 7.4/hematocrit 23.2-drifting-to be transfused BUN 11/creatinine 0.80  Sitting up in chair, says she feels okay, no bowel movement on 10/16/2021, after laxative had melena yesterday, and has had 2 bowel movements today, appear melenic.  She says she has seen some small clots.  No complaints of abdominal pain. She has not had prior colonoscopy.    Objective   Vital signs in last 24 hours: Temp:  [97.9 F (36.6 C)-99.4 F (37.4 C)] 99 F (37.2 C) (06/12 0754) Pulse Rate:  [66-79] 79 (06/12 0808) Resp:  [15-22] 18 (06/12 0808) BP: (107-139)/(57-86) 137/86 (06/12 0808) SpO2:  [95 %-100 %] 100 % (06/12 0808) Last BM Date : 10/18/21 General:    Older white female in NAD Heart:  Regular rate and rhythm; no murmurs Lungs: Respirations even and unlabored, lungs CTA bilaterally Abdomen:  Soft, nontender and nondistended. Normal bowel sounds. Extremities:  Without edema. Neurologic:  Alert and oriented,  grossly normal neurologically. Psych:  Cooperative. Normal mood and affect.  Intake/Output from previous day: 06/11 0701 - 06/12 0700 In: 2781.7 [P.O.:720; I.V.:1447.9; IV Piggyback:613.8] Out: 600 [Urine:600] Intake/Output this shift: No intake/output data recorded.  Lab Results: Recent Labs    10/16/21 1703 10/17/21 0530 10/18/21 0650  WBC 1.9* 3.3* 4.1  HGB 9.3* 7.9* 7.4*  HCT 27.7* 23.7* 23.2*  PLT 136* 126* 117*   BMET Recent Labs    10/16/21 1703 10/17/21 0517 10/18/21 0650  NA 140 143 143  K 3.9 4.5 3.8  CL 114* 118* 118*  CO2 22 21* 22  GLUCOSE 156* 161* 116*  BUN '9 9 11  '$ CREATININE 0.88 0.70 0.80  CALCIUM 7.8* 7.9* 8.1*   LFT Recent Labs    10/18/21 0650  PROT 5.3*  ALBUMIN 3.0*  AST 15  ALT 13  ALKPHOS 30*  BILITOT 0.7   PT/INR Recent Labs    10/15/21 2242 10/16/21 0613   LABPROT 14.2 14.0  INR 1.1 1.1    Studies/Results: [place] EKG SITE RITE  Result Date: 10/16/2021 If Site Rite image not attached, placement could not be confirmed due to current cardiac rhythm.  DG CHEST PORT 1 VIEW  Result Date: 10/16/2021 CLINICAL DATA:  Hypoxia EXAM: PORTABLE CHEST 1 VIEW COMPARISON:  09/12/2021 FINDINGS: Cardiac size is within normal limits. There are no signs of pulmonary edema. Increased density in the retrocardiac region may be due to hiatal hernia. There is no pleural effusion or pneumothorax. IMPRESSION: There are no signs of pulmonary edema or focal pulmonary consolidation. Electronically Signed   By: Elmer Picker M.D.   On: 10/16/2021 14:27       Assessment / Plan:    #95 56 year old white female with known metastatic breast cancer to bone,, admitted with fatigue and melena Similar admission May 2023  EGD 10/16/2021 revealed a very large hiatal hernia/9 cm with multiple Cameron erosions, and grade B esophagitis.  No stigmata of active bleeding  Hemoglobin has continued to drift and she has had melena today and yesterday.   To be transfused today Given ongoing signs of active oozing, need to consider other sources for GI bleeding, small bowel versus colon   #2 hypotension, lactic acidosis-concern for possible sepsis-on cefepime/IV vancomycin  Blood cultures no growth thus far Chest x-ray negative  Hypotension and lactic acidosis have resolved  #3  allergic reaction to IV Feraheme-resolved #4 very large hiatal hernia with intrathoracic stomach  Plan; switch to clear liquids today, n.p.o. after midnight Patient will be scheduled for colonoscopy and enteroscopy with Dr. Tarri Glenn, tomorrow 10/19/2021 in the afternoon.  Both seizures were discussed in detail with the patient including indications risk and benefits and she is agreeable to proceed.  Bowel prep late this evening Transfusion x1 today, then transfuse to keep hemoglobin 7.5 or  above Continue twice daily PPI    Principal Problem:   GI bleed     LOS: 3 days   Victoriya Pol  PA-C6/04/2022, 8:45 AM

## 2021-10-18 NOTE — TOC Progression Note (Signed)
Transition of Care (TOC) - Progression Note   Transition of Care (TOC) Screening Note  Patient Details  Name: Caitlin Greene Date of Birth: Jan 27, 1966  Transition of Care [facility]) CM/SW Contact:    Sherie Don, LCSW Phone Number: 10/18/2021, 9:19 AM  Transition of Care Department [facility]) has reviewed patient and no TOC needs have been identified at this time. We will continue to monitor patient advancement through interdisciplinary progression rounds. If new patient transition needs arise, please place a TOC consult.  Barriers to Discharge: Continued Medical Work up  Readmission Risk Interventions    10/18/2021    9:18 AM  Readmission Risk Prevention Plan  Transportation Screening Complete  HRI or Home Care Consult Complete  Social Work Consult for [place] Planning/Counseling Complete  Palliative Care Screening Not Applicable

## 2021-10-18 NOTE — Telephone Encounter (Signed)
-----   Message from Irving Copas., MD sent at 10/17/2021 12:25 PM EDT ----- Regarding: Follow-up of hospital patient Caitlin Greene, This patient will be seeing DJ in follow-up in a few days. In the interim she will remain in the hospital and likely be discharged early next week. She needs a referral to [facility] surgery for discussion of hiatal hernia repair. If DJ has particular individuals that he has referred to specifically, then go ahead and place that consultation otherwise I am okay with any of the surgeons (the office should directed to appropriate individual) or Dr. Andria Meuse whom I have had some patients referred to previously. Thanks. GM

## 2021-10-18 NOTE — Progress Notes (Signed)
Patient ID: Caitlin Greene, female   DOB: 1966/04/24, 56 y.o.   MRN: [id number]    Progress Note   Subjective   Day # 3 CC; GI bleed, melena  Labs today-hemoglobin 7.4/hematocrit 23.2-drifting-to be transfused BUN 11/creatinine 0.80  Sitting up in chair, says she feels okay, no bowel movement on 10/16/2021, after laxative had melena yesterday, and has had 2 bowel movements today, appear melenic.  She says she has seen some small clots.  No complaints of abdominal pain. She has not had prior colonoscopy.    Objective   Vital signs in last 24 hours: Temp:  [97.9 F (36.6 C)-99.4 F (37.4 C)] 99 F (37.2 C) (06/12 0754) Pulse Rate:  [66-79] 79 (06/12 0808) Resp:  [15-22] 18 (06/12 0808) BP: (107-139)/(57-86) 137/86 (06/12 0808) SpO2:  [95 %-100 %] 100 % (06/12 0808) Last BM Date : 10/18/21 General:    Older white female in NAD Heart:  Regular rate and rhythm; no murmurs Lungs: Respirations even and unlabored, lungs CTA bilaterally Abdomen:  Soft, nontender and nondistended. Normal bowel sounds. Extremities:  Without edema. Neurologic:  Alert and oriented,  grossly normal neurologically. Psych:  Cooperative. Normal mood and affect.  Intake/Output from previous day: 06/11 0701 - 06/12 0700 In: 2781.7 [P.O.:720; I.V.:1447.9; IV Piggyback:613.8] Out: 600 [Urine:600] Intake/Output this shift: No intake/output data recorded.  Lab Results: Recent Labs    10/16/21 1703 10/17/21 0530 10/18/21 0650  WBC 1.9* 3.3* 4.1  HGB 9.3* 7.9* 7.4*  HCT 27.7* 23.7* 23.2*  PLT 136* 126* 117*   BMET Recent Labs    10/16/21 1703 10/17/21 0517 10/18/21 0650  NA 140 143 143  K 3.9 4.5 3.8  CL 114* 118* 118*  CO2 22 21* 22  GLUCOSE 156* 161* 116*  BUN '9 9 11  '$ CREATININE 0.88 0.70 0.80  CALCIUM 7.8* 7.9* 8.1*   LFT Recent Labs    10/18/21 0650  PROT 5.3*  ALBUMIN 3.0*  AST 15  ALT 13  ALKPHOS 30*  BILITOT 0.7   PT/INR Recent Labs    10/15/21 2242 10/16/21 0613   LABPROT 14.2 14.0  INR 1.1 1.1    Studies/Results: [place] EKG SITE RITE  Result Date: 10/16/2021 If Site Rite image not attached, placement could not be confirmed due to current cardiac rhythm.  DG CHEST PORT 1 VIEW  Result Date: 10/16/2021 CLINICAL DATA:  Hypoxia EXAM: PORTABLE CHEST 1 VIEW COMPARISON:  09/12/2021 FINDINGS: Cardiac size is within normal limits. There are no signs of pulmonary edema. Increased density in the retrocardiac region may be due to hiatal hernia. There is no pleural effusion or pneumothorax. IMPRESSION: There are no signs of pulmonary edema or focal pulmonary consolidation. Electronically Signed   By: Elmer Picker M.D.   On: 10/16/2021 14:27       Assessment / Plan:    #24 56 year old white female with known metastatic breast cancer to bone,, admitted with fatigue and melena Similar admission May 2023  EGD 10/16/2021 revealed a very large hiatal hernia/9 cm with multiple Cameron erosions, and grade B esophagitis.  No stigmata of active bleeding  Hemoglobin has continued to drift and she has had melena today and yesterday.   To be transfused today Given ongoing signs of active oozing, need to consider other sources for GI bleeding, small bowel versus colon   #2 hypotension, lactic acidosis-concern for possible sepsis-on cefepime/IV vancomycin  Blood cultures no growth thus far Chest x-ray negative  Hypotension and lactic acidosis have resolved  #3  allergic reaction to IV Feraheme-resolved #4 very large hiatal hernia with intrathoracic stomach  Plan; switch to clear liquids today, n.p.o. after midnight Patient will be scheduled for colonoscopy and enteroscopy with Dr. Tarri Glenn, tomorrow 10/19/2021 in the afternoon.  Both seizures were discussed in detail with the patient including indications risk and benefits and she is agreeable to proceed.  Bowel prep late this evening Transfusion x1 today, then transfuse to keep hemoglobin 7.5 or  above Continue twice daily PPI    Principal Problem:   GI bleed     LOS: 3 days   Caitlin Mackley  PA-C6/04/2022, 8:45 AM

## 2021-10-18 NOTE — Progress Notes (Signed)
Caitlin Greene is down in the ICU right now.  Apparently, on Saturday, she had a reaction to the Feraheme.  I know she has had Feraheme before.  She also had a repeat an upper endoscopy.  It did show that she had a large hiatal hernia with some Cameron erosions.  She had hemoglobin of 7.9 yesterday.  We will have to see what her hemoglobin is today.  Her blood pressure has been on the low side.  She had a chest x-ray on the 10th.  This did not show any infiltrate or effusions or vascular congestion.  She feels okay this morning.  She has had no cough.  No shortness of breath.  There is no abdominal pain.  I am unsure when she is able to eat right now.  Her vital signs are temperature 97.9.  Pulse 70.  Blood pressure 112/62.  Oxygen saturation 95%.  Her lungs are clear bilaterally.  She has good air movement bilaterally.  Cardiac exam regular rate and rhythm.  Abdomen is soft.  Bowel sounds are slightly decreased.  She has no obvious fluid wave.  Extremity shows no clubbing, cyanosis or edema.  Looks like she is going need to have surgery for the hiatal hernia.  Obviously, this will be done as an outpatient.  I do not see any problems with her having this done from her perspective of her breast cancer.  The hemoglobin will be critical.  She dropped quite a bit in 1 day from Saturday and Sunday.  Hopefully, she will be able to go home soon.  There is still a lot going on with her that we will prevent her from going home right now.  I do appreciate the incredible care that she is getting from the staff down in the ICU.  Lattie Haw, MD  Darlyn Chamber  1:19

## 2021-10-18 NOTE — Telephone Encounter (Signed)
Referral has been made and records faxed.  

## 2021-10-19 ENCOUNTER — Encounter (HOSPITAL_COMMUNITY): Payer: Self-pay | Admitting: Internal Medicine

## 2021-10-19 ENCOUNTER — Encounter (HOSPITAL_COMMUNITY)
Admission: EM | Disposition: A | Discharge status: Home / Self Care | Payer: Self-pay | Source: Home / Self Care | Attending: Internal Medicine

## 2021-10-19 ENCOUNTER — Inpatient Hospital Stay (HOSPITAL_COMMUNITY): Payer: BC Managed Care – PPO | Admitting: Anesthesiology

## 2021-10-19 DIAGNOSIS — K922 Gastrointestinal hemorrhage, unspecified: Secondary | ICD-10-CM | POA: Diagnosis not present

## 2021-10-19 DIAGNOSIS — D500 Iron deficiency anemia secondary to blood loss (chronic): Secondary | ICD-10-CM | POA: Diagnosis not present

## 2021-10-19 DIAGNOSIS — K559 Vascular disorder of intestine, unspecified: Principal | ICD-10-CM

## 2021-10-19 DIAGNOSIS — D120 Benign neoplasm of cecum: Secondary | ICD-10-CM

## 2021-10-19 HISTORY — PX: COLONOSCOPY WITH PROPOFOL: SHX5780

## 2021-10-19 HISTORY — PX: BIOPSY: SHX5522

## 2021-10-19 HISTORY — PX: ENTEROSCOPY: SHX5533

## 2021-10-19 HISTORY — PX: POLYPECTOMY: SHX5525

## 2021-10-19 LAB — GLUCOSE, CAPILLARY
Glucose-Capillary: 84 mg/dL (ref 70–99)
Glucose-Capillary: 98 mg/dL (ref 70–99)

## 2021-10-19 LAB — HEMOGLOBIN AND HEMATOCRIT, BLOOD
HCT: 31.5 % — ABNORMAL LOW (ref 36.0–46.0)
Hemoglobin: 10.7 g/dL — ABNORMAL LOW (ref 12.0–15.0)

## 2021-10-19 LAB — TYPE AND SCREEN
ABO/RH(D): O POS
ABO/RH(D): O POS
Antibody Screen: NEGATIVE
Antibody Screen: NEGATIVE
Unit division: 0
Unit division: 0

## 2021-10-19 LAB — BPAM RBC
Blood Product Expiration Date: 202306162359
Blood Product Expiration Date: 202307062359
ISSUE DATE / TIME: 202306100019
ISSUE DATE / TIME: 202306120937
Unit Type and Rh: 5100
Unit Type and Rh: 9500

## 2021-10-19 SURGERY — COLONOSCOPY WITH PROPOFOL
Anesthesia: Monitor Anesthesia Care

## 2021-10-19 MED ORDER — SODIUM PHOSPHATES 45 MMOLE/15ML IV SOLN
30.0000 mmol | Freq: Once | INTRAVENOUS | Status: AC
  Administered 2021-10-19: 30 mmol via INTRAVENOUS
  Filled 2021-10-19: qty 10

## 2021-10-19 MED ORDER — ONDANSETRON HCL 4 MG/2ML IJ SOLN
INTRAMUSCULAR | Status: DC | PRN
  Administered 2021-10-19: 4 mg via INTRAVENOUS

## 2021-10-19 MED ORDER — POTASSIUM PHOSPHATES 15 MMOLE/5ML IV SOLN: 30.0000 mmol | Freq: Once | INTRAVENOUS | Status: DC

## 2021-10-19 MED ORDER — SODIUM CHLORIDE 0.9 % IV SOLN
2.0000 g | Freq: Three times a day (TID) | INTRAVENOUS | Status: AC
  Administered 2021-10-19 – 2021-10-20 (×3): 2 g via INTRAVENOUS
  Filled 2021-10-19 (×3): qty 12.5

## 2021-10-19 MED ORDER — EPHEDRINE SULFATE (PRESSORS) 50 MG/ML IJ SOLN
INTRAMUSCULAR | Status: DC | PRN
  Administered 2021-10-19: 5 mg via INTRAVENOUS

## 2021-10-19 MED ORDER — PHENYLEPHRINE HCL (PRESSORS) 10 MG/ML IV SOLN
INTRAVENOUS | Status: DC | PRN
  Administered 2021-10-19 (×2): 160 ug via INTRAVENOUS

## 2021-10-19 MED ORDER — VANCOMYCIN HCL IN DEXTROSE 1-5 GM/200ML-% IV SOLN
1000.0000 mg | Freq: Two times a day (BID) | INTRAVENOUS | Status: DC
  Administered 2021-10-19 – 2021-10-20 (×2): 1000 mg via INTRAVENOUS
  Filled 2021-10-19 (×2): qty 200

## 2021-10-19 MED ORDER — LIDOCAINE HCL (CARDIAC) PF 100 MG/5ML IV SOSY
PREFILLED_SYRINGE | INTRAVENOUS | Status: DC | PRN
  Administered 2021-10-19: 60 mg via INTRAVENOUS

## 2021-10-19 MED ORDER — PROPOFOL 500 MG/50ML IV EMUL
INTRAVENOUS | Status: DC | PRN
  Administered 2021-10-19: 100 ug/kg/min via INTRAVENOUS
  Administered 2021-10-19: 10 mg via INTRAVENOUS
  Administered 2021-10-19: 30 mg via INTRAVENOUS
  Administered 2021-10-19: 10 mg via INTRAVENOUS

## 2021-10-19 MED ORDER — LACTATED RINGERS IV SOLN: INTRAVENOUS | Status: DC

## 2021-10-19 SURGICAL SUPPLY — 22 items

## 2021-10-19 NOTE — Transfer of Care (Signed)
Immediate Anesthesia Transfer of Care Note  Patient: Caitlin Greene  Procedure(s) Performed: COLONOSCOPY WITH PROPOFOL ENTEROSCOPY POLYPECTOMY BIOPSY  Patient Location: PACU  Anesthesia Type:MAC  Level of Consciousness: awake, alert  and oriented  Airway & Oxygen Therapy: Patient Spontanous Breathing and Patient connected to face mask  Post-op Assessment: Report given to RN, Post -op Vital signs reviewed and stable and Patient moving all extremities X 4  Post vital signs: Reviewed and Stable  Last Vitals:  Vitals Value Taken Time  BP    Temp    Pulse 92 10/19/21 1420  Resp 18 10/19/21 1420  SpO2 99 % 10/19/21 1420  Vitals shown include unvalidated device data.  Last Pain:  Vitals:   10/19/21 1242  TempSrc: Temporal  PainSc: 0-No pain         Complications: No notable events documented.

## 2021-10-19 NOTE — Op Note (Addendum)
[facility] Patient Name: Caitlin Greene Procedure Date: 10/19/2021 MRN: [id number] Attending MD: Thornton Park MD, MD Date of Birth: 1965-11-08 CSN: [id number] Age: 56 Admit Type: Inpatient Procedure:                Colonoscopy Indications:              Melena not explained by EGD or push enteroscopy Providers:                Thornton Park MD, MD, Jaci Carrel, RN,                            Despina Pole, Technician Referring MD:              Medicines:                Monitored Anesthesia Care Complications:            No immediate complications. Estimated Blood Loss:     Estimated blood loss was minimal. Procedure:                Pre-Anesthesia Assessment:                           - Prior to the procedure, a History and Physical                            was performed, and patient medications and                            allergies were reviewed. The patient's tolerance of                            previous anesthesia was also reviewed. The risks                            and benefits of the procedure and the sedation                            options and risks were discussed with the patient.                            All questions were answered, and informed consent                            was obtained. Prior Anticoagulants: The patient has                            taken no previous anticoagulant or antiplatelet                            agents. ASA Grade Assessment: III - A patient with                            severe systemic disease. After reviewing the risks  and benefits, the patient was deemed in                            satisfactory condition to undergo the procedure.                           After obtaining informed consent, the colonoscope                            was passed under direct vision. Throughout the                            procedure, the patient's blood pressure, pulse, and                             oxygen saturations were monitored continuously. The                            [id number]-HQ190L ([id number]) Olympus colonoscope was                            introduced through the anus and advanced to the 5                            cm into the ileum. A second forward view of the                            right colon was performed. The colonoscopy was                            performed without difficulty. The patient tolerated                            the procedure well. The quality of the bowel                            preparation was good. The terminal ileum, ileocecal                            valve, appendiceal orifice, and rectum were                            photographed. Scope In: 1:47:27 PM Scope Out: 2:08:15 PM Scope Withdrawal Time: 0 hours 15 minutes 26 seconds  Total Procedure Duration: 0 hours 20 minutes 48 seconds  Findings:      The perianal and digital rectal examinations were normal.      Multiple small and large-mouthed diverticula were found in the sigmoid       colon, descending colon, transverse colon and ascending colon.      A localized area of mildly erythematous, friable (with contact bleeding)       and focally ulcerated mucosa was found in the sigmoid colon, located       from 25 to 35 cm from the incisors. In some areas the erythema is linear  and in other areas it is circumferential. The colitis is located within       the area of her most dense diverticulosis. Biopsies were taken with a       cold forceps for histology. Estimated blood loss was minimal but there       was oozing with each biopsy.      A 1 mm polyp was found in the cecum. The polyp was sessile. The polyp       was removed with a cold snare. Resection and retrieval were complete.       Estimated blood loss was minimal.      The terminal ileum appeared normal. No blood present. Impression:               - Diverticulosis in the sigmoid colon, in the                             descending colon, in the transverse colon and in                            the ascending colon.                           - Erythematous, friable (with contact bleeding) and                            ulcerated mucosa in the sigmoid colon. Endoscopic                            appearance is suspicious for ischemic colitis.                            Biopsied.                           - One 1 mm polyp in the cecum, removed with a cold                            snare. Resected and retrieved.                           - No blood seen in the colon or distal terminal                            ileum. Although there is colitis, the bleeding                            would be more consistent with hematochezia or                            bright red blood and not melena. Moderate Sedation:      Not Applicable - Patient had care per Anesthesia. Recommendation:           - Clear liquid diet. Advance diet as tolerated.                           -  Continue present medications.                           - Avoid hypotension.                           - Avoid NSAIDs as able.                           - Await pathology results.                           - Serial hgb/hct with transfusion as indicated.                           Results and recommendations reviewed with the                            patient and her sister in the endoscopy recovery                            area. All questions answered to their satisfaction. Procedure Code(s):        --- Professional ---                           [phone], Colonoscopy, flexible; with removal of                            tumor(s), polyp(s), or other lesion(s) by snare                            technique                           [id number], 46, Colonoscopy, flexible; with biopsy,                            single or multiple Diagnosis Code(s):        --- Professional ---                           K63.89, Other specified diseases of intestine                            K92.2, Gastrointestinal hemorrhage, unspecified                           K63.3, Ulcer of intestine                           K63.5, Polyp of colon                           K92.1, Melena (includes Hematochezia)                           K57.30, Diverticulosis of large intestine without  perforation or abscess without bleeding CPT copyright 2019 [employer]. All rights reserved. The codes documented in this report are preliminary and upon coder review may  be revised to meet current compliance requirements. Thornton Park MD, MD 10/19/2021 2:37:48 PM This report has been signed electronically. Number of Addenda: 0

## 2021-10-19 NOTE — Progress Notes (Signed)
PROGRESS NOTE  Caitlin Greene IWP:[id number] DOB: 09/25/1965 DOA: 10/15/2021 PCP: Biagio Borg, MD  HPI/Recap of past 24 hours: Caitlin Greene is a 56 y.o. female with medical history significant of hyperlipidemia, chronic macrocytic anemia, chronic anxiety/depression, GERD, metastatic breast cancer with metastasis to bone, history of GI bleed with recent admission, who presented to the ED with progressive fatigue over the last few days.  Associated with dark stools with onset 24 hour prior to presentation.  Associated with intermittent epigastric pain.  She was admitted for upper GI bleed and was seen GI.  Post EGD which revealed Cameron's erosions.  The patient went into acute distress the afternoon of 10/16/2021 after initiation of IV Feraheme.  She became severely hypotensive with nausea and dry heave.  She was transferred to stepdown unit.  Further work-up revealed elevated lactic acid up to 3.6, leukopenia 1.9, severe hypotension, requiring 10 mg midodrine 3 times daily.  Blood cultures were collected and the patient was started on IV antibiotics empirically, IV vancomycin and cefepime until an active infective process was ruled out.  Hospital course complicated by acute blood loss anemia with Hg 7.4 K from 9.3 K 2 days prior with complaints of melena.  GI was re-consulted.  Plan for colonoscopy on 10/19/2021.  10/19/2021: Her blood pressure is improved.  She feels better after blood transfusion.  No new complaints.  Plan for colonoscopy today.  Assessment/Plan: Principal Problem:   GI bleed  Recurrent GI bleed, unclear etiology. Recent admission for the same last month Seen by GI, post EGD which showed Cameron's ulcers and reflux esophagitis.  GI recommends PO PPI BID x 6 weeks, BID Carafate x 2 weeks.  Continue po PPI BID and carafate as recommended by GI Colonoscopy planned on 10/19/2021  Acute blood loss anemia in the setting of GI bleed. 1 unit PRBCs ordered to be transfused on  10/18/2021 for hemoglobin of 7.4K and reported melena. Hemoglobin is now stable posttransfusion  Acute thrombocytopenia Platelet count downtrending 117 from 126. Continue to monitor  Resolved severe hypotension Resolved Lactic acidosis Possible sepsis of unclear etiology Presyncopal episode the afternoon of 10/16/21 after initiation of IV Feraheme With severe hypotension with MAP in the 40s to 50s. IV fluid hydration; orthostatic vital signs were positive.  Blood cultures obtained peripherally on 10/16/2021, negative to date Random cortisol level was normal. MRSA screening test positive Possible intra-abdominal infection, started on cefepime and IV vancomycin on 10/17/2021  Resolved acute hypoxic respiratory failure likely secondary to atelectasis Personally reviewed chest x-ray, which was nonacute. Continue incentive spirometer and flutter valve. Currently on room air with O2 saturation of 98%.  Questionable IV Feraheme allergy Reaction/clinical status change with initiation of IV Feraheme She has had IV Feraheme in the past-no rashes Received IV Solu-Medrol, Pepcid, Benadryl, as stated above.  Resolved post repletion: Hypokalemia  Serum potassium 4.5, serum magnesium 2.4.   B12 deficiency Vitamin B12 level 179. Supplement as needed   Subclinical hypothyroidism Free T4  0.80 With TSH of 4.846   Hx of BRCA with metastasis to the bone -Patient currently being treated for breast cancer with mets to the bone - Followed by Dr. Marin Olp   GERD Continue PPI as recommended by GI.   Chronic anxiety/depression Continue home regimen.      DVT prophylaxis: scds due to concern for GI bleed Code Status: full code Family Communication: None at bedside today Disposition Plan: The patient requires at least 2 midnights for further evaluation and treatment of present condition.  Consults called: GI. Admission status: Transferred to stepdown unit from progressive unit on 10/16/2021.        Objective: Vitals:   10/19/21 0358 10/19/21 0812 10/19/21 1142 10/19/21 1242  BP:    123/86  Pulse:    83  Resp:    20  Temp: 98.6 F (37 C) 97.6 F (36.4 C) 98.2 F (36.8 C) 98.3 F (36.8 C)  TempSrc: Oral Oral Oral Temporal  SpO2:    95%  Weight:    89.4 kg  Height:    5' 8"  (1.727 m)    Intake/Output Summary (Last 24 hours) at 10/19/2021 1315 Last data filed at 10/18/2021 2350 Gross per 24 hour  Intake 1136.51 ml  Output --  Net 1136.51 ml   Filed Weights   10/15/21 1319 10/16/21 0902 10/19/21 1242  Weight: 89.4 kg 89.4 kg 89.4 kg    Exam:  General: 56 y.o. year-old female pleasant well-developed well-nourished in no acute distress.  She is alert and oriented x3.   Cardiovascular: Regular rate and rhythm no rubs or gallops. Respiratory: Clear to auscultation no wheezes or rales. Abdomen: Nontender, bowel sounds positive Musculoskeletal: No lower extremity edema bilaterally. Skin: No ulcerative lesions noted. Psychiatry: Mood is appropriate for condition and setting.   Data Reviewed: CBC: Recent Labs  Lab 10/15/21 1402 10/15/21 2242 10/16/21 5916 10/16/21 1703 10/17/21 0530 10/18/21 0650 10/18/21 1637 10/18/21 1813 10/19/21 0259  WBC 2.1* 2.7*  --  1.9* 3.3* 4.1  --   --   --   NEUTROABS 1.3* 1.4*  --  1.6* 3.0 2.7  --   --   --   HGB 8.2* 7.6*   < > 9.3* 7.9* 7.4* 10.5* 11.7* 10.7*  HCT 23.7* 22.5*   < > 27.7* 23.7* 23.2* 31.8* 35.1* 31.5*  MCV 105.3* 108.7*  --  103.4* 104.9* 107.4*  --   --   --   PLT 139* 144*  --  136* 126* 117*  --   --   --    < > = values in this interval not displayed.   Basic Metabolic Panel: Recent Labs  Lab 10/15/21 2242 10/16/21 0613 10/16/21 1703 10/17/21 0517 10/18/21 0650  NA 140 144 140 143 143  K 2.9* 3.0* 3.9 4.5 3.8  CL 111 114* 114* 118* 118*  CO2 24 26 22  21* 22  GLUCOSE 85 92 156* 161* 116*  BUN 10 11 9 9 11   CREATININE 0.83 0.80 0.88 0.70 0.80  CALCIUM 8.1* 8.1* 7.8* 7.9* 8.1*  MG 1.9  --   2.7* 2.4  --   PHOS  --   --  1.1*  --  1.7*   GFR: Estimated Creatinine Clearance: 90.8 mL/min (by C-G formula based on SCr of 0.8 mg/dL). Liver Function Tests: Recent Labs  Lab 10/15/21 2242 10/16/21 0613 10/16/21 1703 10/17/21 0517 10/18/21 0650  AST 13* 11* 16 14* 15  ALT 11 11 13 13 13   ALKPHOS 30* 34* 37* 32* 30*  BILITOT 0.7 0.9 0.9 0.8 0.7  PROT 5.7* 5.8* 6.0* 5.5* 5.3*  ALBUMIN 3.2* 3.3* 3.4* 3.2* 3.0*   Recent Labs  Lab 10/15/21 1402  LIPASE 24   No results for input(s): "AMMONIA" in the last 168 hours. Coagulation Profile: Recent Labs  Lab 10/15/21 2242 10/16/21 0613  INR 1.1 1.1   Cardiac Enzymes: No results for input(s): "CKTOTAL", "CKMB", "CKMBINDEX", "TROPONINI" in the last 168 hours. BNP (last 3 results) No results for input(s): "PROBNP" in the last  8760 hours. HbA1C: No results for input(s): "HGBA1C" in the last 72 hours.  CBG: Recent Labs  Lab 10/16/21 0756 10/17/21 0752 10/18/21 0751 10/19/21 0813 10/19/21 1141  GLUCAP 94 129* 79 84 98   Lipid Profile: No results for input(s): "CHOL", "HDL", "LDLCALC", "TRIG", "CHOLHDL", "LDLDIRECT" in the last 72 hours. Thyroid Function Tests: No results for input(s): "TSH", "T4TOTAL", "FREET4", "T3FREE", "THYROIDAB" in the last 72 hours.  Anemia Panel: No results for input(s): "VITAMINB12", "FOLATE", "FERRITIN", "TIBC", "IRON", "RETICCTPCT" in the last 72 hours.  Urine analysis:    Component Value Date/Time   COLORURINE COLORLESS (A) 10/16/2021 1827   APPEARANCEUR CLEAR 10/16/2021 1827   LABSPEC 1.005 10/16/2021 1827   PHURINE 5.0 10/16/2021 1827   GLUCOSEU 50 (A) 10/16/2021 1827   GLUCOSEU NEGATIVE 04/12/2018 1413   HGBUR SMALL (A) 10/16/2021 1827   BILIRUBINUR NEGATIVE 10/16/2021 1827   BILIRUBINUR negative 07/06/2019 1507   BILIRUBINUR 1 12/20/2014 1007   KETONESUR NEGATIVE 10/16/2021 1827   PROTEINUR NEGATIVE 10/16/2021 1827   UROBILINOGEN 1.0 07/06/2019 1507   UROBILINOGEN 0.2  04/12/2018 1413   NITRITE NEGATIVE 10/16/2021 1827   LEUKOCYTESUR NEGATIVE 10/16/2021 1827   Sepsis Labs: @LABRCNTIP (procalcitonin:4,lacticidven:4)  ) Recent Results (from the past 240 hour(s))  MRSA Next Gen by PCR, Nasal     Status: Abnormal   Collection Time: 10/16/21  2:07 PM   Specimen: Nasal Mucosa; Nasal Swab  Result Value Ref Range Status   MRSA by PCR Next Gen DETECTED (A) NOT DETECTED Final    Comment: CALLED KOONTZ,A @1612  ON 10/16/21 BY LUZOLOP (NOTE) The GeneXpert MRSA Assay (FDA approved for NASAL specimens only), is one component of a comprehensive MRSA colonization surveillance program. It is not intended to diagnose MRSA infection nor to guide or monitor treatment for MRSA infections. Test performance is not FDA approved in patients less than 36 years old. Performed at [facility], [place] [address] [place], [place] [address]   Culture, blood (Routine X 2) w Reflex to ID Panel     Status: None (Preliminary result)   Collection Time: 10/16/21  5:25 PM   Specimen: BLOOD  Result Value Ref Range Status   Specimen Description   Final    BLOOD BLOOD RIGHT FOREARM Performed at [facility] [address] [place], [place] [address]    Special Requests   Final    BOTTLES DRAWN AEROBIC ONLY Blood Culture results may not be optimal due to an inadequate volume of blood received in culture bottles Performed at [facility] [address], [place], [place] [address]    Culture   Final    NO GROWTH 3 DAYS Performed at [facility], [place] [address], [place], [place] [address]    Report Status PENDING  Incomplete  Culture, blood (Routine X 2) w Reflex to ID Panel     Status: None (Preliminary result)   Collection Time: 10/16/21  7:50 PM   Specimen: BLOOD  Result Value Ref Range Status   Specimen Description   Final    BLOOD BLOOD LEFT HAND Performed at [facility] [address],  [place], [place] [address]    Special Requests   Final    IN PEDIATRIC BOTTLE Blood Culture adequate volume Performed at [facility] [address] [place],  [address]    Culture   Final    NO GROWTH 3 DAYS Performed at [facility], [place] [address]  [place], [place] [address]    Report Status PENDING  Incomplete      Studies: No results found.  Scheduled Meds:  [MAR Hold] sodium chloride   Intravenous Once   [MAR Hold] sodium chloride   Intravenous Once   [MAR Hold] Chlorhexidine Gluconate Cloth  6 each Topical Daily   [MAR Hold] cyanocobalamin  1,000 mcg Intramuscular Daily   [MAR Hold] FLUoxetine  60 mg Oral Daily   [MAR Hold] folic acid  1 mg Oral Daily   [MAR Hold]  morphine injection  4 mg Intravenous Once   [MAR Hold] mupirocin ointment  1 application  Nasal BID   [MAR Hold] pantoprazole  40 mg Oral BID   [MAR Hold] senna-docusate  2 tablet Oral BID   [MAR Hold] sucralfate  1 g Oral TID WC & HS   [MAR Hold] vitamin B-12  1,000 mcg Oral Daily    Continuous Infusions:  sodium chloride 50 mL/hr at 10/19/21 0857   ceFEPime (MAXIPIME) IV     lactated ringers 10 mL/hr at 10/19/21 1245   sodium phosphate 30 mmol in dextrose 5 % 250 mL infusion 30 mmol (10/19/21 0821)   vancomycin       LOS: 4 days     Kayleen Memos, MD [facility] Pager [phone]  If 7PM-7AM, please contact night-coverage [email or web address] Password [email or web address] 10/19/2021, 1:15 PM

## 2021-10-19 NOTE — Interval H&P Note (Signed)
History and Physical Interval Note:  10/19/2021 1:17 PM  Caitlin Greene  has presented today for surgery, with the diagnosis of GI bleed, melena,.  The various methods of treatment have been discussed with the patient and family. After consideration of risks, benefits and other options for treatment, the patient has consented to  Procedure(s): COLONOSCOPY WITH PROPOFOL (N/A) ENTEROSCOPY (N/A) as a surgical intervention.  The patient's history has been reviewed, patient examined, no change in status, stable for surgery.  I have reviewed the patient's chart and labs.  Questions were answered to the patient's satisfaction.     Thornton Park

## 2021-10-19 NOTE — Progress Notes (Signed)
Looks like Ms. Pasko will undergo enteroscopy and colonoscopy today.  She still having some bouts of melena.  Her hemoglobin seems to be holding pretty steady at 10.7.  There is no abdominal pain.  She has had no problems with nausea or vomiting.  She has not been able to eat all that much.  She has had no problems with the dizziness.  Her blood pressure seems to be doing okay.  There is been no problems with fever.  She had blood cultures that were drawn 3 days ago.  They were all negative.  Her vital signs are all pretty stable.  Temperature 98.6.  Pulse 74.  Blood pressure 116/79.  Her lungs are clear bilaterally.  She has good air movement bilaterally.  Cardiac exam regular rate and rhythm.  Abdomen is soft.  Bowel sounds are present.  There is no guarding or rebound tenderness.  Extremity shows no clubbing, cyanosis or edema.  We will have to see what the scope shows with Ms. Amore.  Again, I cannot imagine this being anything related to her having metastatic breast cancer since her cancer is pretty much confined to her bones.  I do appreciate the incredible care that she is getting from everybody up on the ICU.  Lattie Haw, MD  Psalms 34:17

## 2021-10-19 NOTE — Progress Notes (Signed)
Patient ID: Jarita Raval, female   DOB: [birth year], 56 y.o.   MRN: [id number]    Progress Note   Subjective  Day # 4  CC; GI bleed, melena  Hemoglobin 7.4 yesterday a.m., transfused 2 units/up to 11.7 this a.m.  Completed prep without difficulty stool clearish yellow this morning.  She says she did have dark stool at the start of the prep.   Objective   Vital signs in last 24 hours: Temp:  [97.4 F (36.3 C)-98.9 F (37.2 C)] 97.6 F (36.4 C) (06/13 0812) Pulse Rate:  [60-92] 74 (06/13 0342) Resp:  [14-26] 15 (06/13 0342) BP: (104-145)/(52-96) 116/79 (06/13 0342) SpO2:  [93 %-100 %] 96 % (06/13 0342) Last BM Date : 10/19/21 General:    white female in NAD Heart:  Regular rate and rhythm; no murmurs Lungs: Respirations even and unlabored, lungs CTA bilaterally Abdomen:  Soft, nontender and nondistended. Normal bowel sounds. Extremities:  Without edema. Neurologic:  Alert and oriented,  grossly normal neurologically. Psych:  Cooperative. Normal mood and affect.  Intake/Output from previous day: 06/12 0701 - 06/13 0700 In: 1851.5 [P.O.:400; I.V.:669.8; Blood:315; IV Piggyback:466.7] Out: -  Intake/Output this shift: No intake/output data recorded.  Lab Results: Recent Labs    10/16/21 1703 10/17/21 0530 10/18/21 0650 10/18/21 1637 10/18/21 1813 10/19/21 0259  WBC 1.9* 3.3* 4.1  --   --   --   HGB 9.3* 7.9* 7.4* 10.5* 11.7* 10.7*  HCT 27.7* 23.7* 23.2* 31.8* 35.1* 31.5*  PLT 136* 126* 117*  --   --   --    BMET Recent Labs    10/16/21 1703 10/17/21 0517 10/18/21 0650  NA 140 143 143  K 3.9 4.5 3.8  CL 114* 118* 118*  CO2 22 21* 22  GLUCOSE 156* 161* 116*  BUN '9 9 11  '$ CREATININE 0.88 0.70 0.80  CALCIUM 7.8* 7.9* 8.1*   LFT Recent Labs    10/18/21 0650  PROT 5.3*  ALBUMIN 3.0*  AST 15  ALT 13  ALKPHOS 30*  BILITOT 0.7   PT/INR No results for input(s): "LABPROT", "INR" in the last 72 hours.       Assessment / Plan:    #38 56 year old  white female with known metastatic breast cancer to bone, admitted with fatigue and melena.  Similar admission in May 2023  EGD this admission 10/16/2021 revealed a 9 cm hiatal hernia with multiple Cameron erosions, grade B esophagitis, no stigmata of active bleeding  Hemoglobin continues to drift and she has had melena. Transfused yesterday x2 and hemoglobin 10.7 this a.m.  Plan is for colonoscopy and enteroscopy this afternoon Continue to monitor serial hemoglobins  #2 hypotension, lactic acidosis, concern for possible sepsis, versus all secondary to allergic reaction to Feraheme  Cultures negative On midodrine, hypotension and lactic acidosis resolved  #3 very large hiatal hernia and intrathoracic stomach-outpatient referral to surgery for consideration of hiatal hernia repair     Principal Problem:   GI bleed     LOS: 4 days   Rolando Hessling PA-C 10/19/2021, 9:09 AM

## 2021-10-19 NOTE — Anesthesia Postprocedure Evaluation (Signed)
Anesthesia Post Note  Patient: Caitlin Greene  Procedure(s) Performed: COLONOSCOPY WITH PROPOFOL ENTEROSCOPY POLYPECTOMY BIOPSY     Patient location during evaluation: Endoscopy Anesthesia Type: MAC Level of consciousness: awake and alert, patient cooperative and oriented Pain management: pain level controlled Vital Signs Assessment: post-procedure vital signs reviewed and stable Respiratory status: nonlabored ventilation, spontaneous breathing and respiratory function stable Cardiovascular status: blood pressure returned to baseline and stable Postop Assessment: no apparent nausea or vomiting and able to ambulate Anesthetic complications: no   No notable events documented.  Last Vitals:  Vitals:   10/19/21 1242 10/19/21 1418  BP: 123/86 124/80  Pulse: 83 93  Resp: 20 19  Temp: 36.8 C 36.6 C  SpO2: 95% 100%    Last Pain:  Vitals:   10/19/21 1418  TempSrc: Temporal  PainSc: 0-No pain                 Leverett Camplin,E. Ellasyn Swilling

## 2021-10-19 NOTE — Anesthesia Preprocedure Evaluation (Addendum)
Anesthesia Evaluation  Patient identified by MRN, date of birth, ID band Patient awake    Reviewed: Allergy & Precautions, NPO status , Patient's Chart, lab work & pertinent test results  Airway Mallampati: II  TM Distance: >3 FB Neck ROM: Full    Dental no notable dental hx.    Pulmonary neg pulmonary ROS,    Pulmonary exam normal breath sounds clear to auscultation       Cardiovascular negative cardio ROS Normal cardiovascular exam Rhythm:Regular Rate:Normal     Neuro/Psych negative neurological ROS  negative psych ROS   GI/Hepatic Neg liver ROS, GERD  ,  Endo/Other  negative endocrine ROS  Renal/GU negative Renal ROS  negative genitourinary   Musculoskeletal negative musculoskeletal ROS (+)   Abdominal   Peds negative pediatric ROS (+)  Hematology  (+) Blood dyscrasia, anemia ,   Anesthesia Other Findings   Reproductive/Obstetrics negative OB ROS                             Anesthesia Physical Anesthesia Plan  ASA: 2  Anesthesia Plan: MAC   Post-op Pain Management: Minimal or no pain anticipated   Induction: Intravenous  PONV Risk Score and Plan: 2 and Propofol infusion and Treatment may vary due to age or medical condition  Airway Management Planned: Simple Face Mask  Additional Equipment:   Intra-op Plan:   Post-operative Plan:   Informed Consent: I have reviewed the patients History and Physical, chart, labs and discussed the procedure including the risks, benefits and alternatives for the proposed anesthesia with the patient or authorized representative who has indicated his/her understanding and acceptance.     Dental advisory given  Plan Discussed with: CRNA and Surgeon  Anesthesia Plan Comments:         Anesthesia Quick Evaluation

## 2021-10-19 NOTE — Op Note (Signed)
[facility] Patient Name: Vola Beneke Procedure Date: 10/19/2021 MRN: [id number] Attending MD: Thornton Park MD, MD Date of Birth: December 25, 1965 CSN: [id number] Age: 56 Admit Type: Inpatient Procedure:                Small bowel enteroscopy Indications:              GI bleeding source not documented by previous UGI                            endoscopy with ongoing melena, required additional                            PRBCs yesterday Providers:                Thornton Park MD, MD, Jaci Carrel, RN,                            Despina Pole, Technician Referring MD:              Medicines:                Monitored Anesthesia Care Complications:            No immediate complications. Estimated Blood Loss:     Estimated blood loss: none. Procedure:                Pre-Anesthesia Assessment:                           - Prior to the procedure, a History and Physical                            was performed, and patient medications and                            allergies were reviewed. The patient's tolerance of                            previous anesthesia was also reviewed. The risks                            and benefits of the procedure and the sedation                            options and risks were discussed with the patient.                            All questions were answered, and informed consent                            was obtained. Prior Anticoagulants: The patient has                            taken no previous anticoagulant or antiplatelet                            agents. ASA  Grade Assessment: III - A patient with                            severe systemic disease. After reviewing the risks                            and benefits, the patient was deemed in                            satisfactory condition to undergo the procedure.                           After obtaining informed consent, the endoscope was                             passed under direct vision. Throughout the                            procedure, the patient's blood pressure, pulse, and                            oxygen saturations were monitored continuously. The                            [id number]-HQ190L ([id number] Olympus colonoscope was                            introduced through the mouth and advanced to the                            jejunum, to the 160 cm mark (from the incisors).                            The small bowel enteroscopy was accomplished                            without difficulty. The patient tolerated the                            procedure well. Scope In: Scope Out: Findings:      One benign-appearing, intrinsic mild stricture was found 34 cm from the       incisors. The stricture was traversed.      A large hiatal hernia was present. Camerons' lesions are present.      The entire examined stomach was normal. No blood seen.      The examined duodenum was normal. No blood present.      There was no evidence of significant pathology in the entire examined       portion of jejunum. Impression:               - Benign-appearing esophageal stricture.                           - Large hiatal hernia with small Cameron's lesions.                           -  Normal stomach.                           - Normal examined duodenum.                           - The examined portion of the jejunum was normal.                           - No evidence for active or recent bleeding on this                            study.                           - No specimens collected. Recommendation:            Procedure Code(s):        --- Professional ---                           [phone], Small intestinal endoscopy, enteroscopy                            beyond second portion of duodenum, not including                            ileum; diagnostic, including collection of                            specimen(s) by brushing or washing, when performed                             (separate procedure) Diagnosis Code(s):        --- Professional ---                           K22.2, Esophageal obstruction                           K44.9, Diaphragmatic hernia without obstruction or                            gangrene                           K92.2, Gastrointestinal hemorrhage, unspecified CPT copyright 2019 [employer]. All rights reserved. The codes documented in this report are preliminary and upon coder review may  be revised to meet current compliance requirements. Thornton Park MD, MD 10/19/2021 2:24:18 PM This report has been signed electronically. Number of Addenda: 0

## 2021-10-20 DIAGNOSIS — D500 Iron deficiency anemia secondary to blood loss (chronic): Secondary | ICD-10-CM | POA: Diagnosis not present

## 2021-10-20 DIAGNOSIS — I959 Hypotension, unspecified: Secondary | ICD-10-CM

## 2021-10-20 DIAGNOSIS — E872 Acidosis, unspecified: Secondary | ICD-10-CM

## 2021-10-20 DIAGNOSIS — D696 Thrombocytopenia, unspecified: Secondary | ICD-10-CM

## 2021-10-20 DIAGNOSIS — K921 Melena: Secondary | ICD-10-CM | POA: Diagnosis not present

## 2021-10-20 LAB — BASIC METABOLIC PANEL
Anion gap: 4 — ABNORMAL LOW (ref 5–15)
BUN: 6 mg/dL (ref 6–20)
CO2: 24 mmol/L (ref 22–32)
Calcium: 8.4 mg/dL — ABNORMAL LOW (ref 8.9–10.3)
Chloride: 114 mmol/L — ABNORMAL HIGH (ref 98–111)
Creatinine, Ser: 0.93 mg/dL (ref 0.44–1.00)
GFR, Estimated: >60 mL/min (ref >60)
Glucose, Bld: 97 mg/dL (ref 70–99)
Potassium: 3.2 mmol/L — ABNORMAL LOW (ref 3.5–5.1)
Sodium: 142 mmol/L (ref 135–145)

## 2021-10-20 LAB — GLUCOSE, CAPILLARY: Glucose-Capillary: 96 mg/dL (ref 70–99)

## 2021-10-20 LAB — PHOSPHORUS: Phosphorus: 3.8 mg/dL (ref 2.5–4.6)

## 2021-10-20 LAB — CBC
HCT: 30.1 % — ABNORMAL LOW (ref 36.0–46.0)
Hemoglobin: 10.2 g/dL — ABNORMAL LOW (ref 12.0–15.0)
MCH: 34.1 pg — ABNORMAL HIGH (ref 26.0–34.0)
MCHC: 33.9 g/dL (ref 30.0–36.0)
MCV: 100.7 fL — ABNORMAL HIGH (ref 80.0–100.0)
Platelets: 131 10*3/uL — ABNORMAL LOW (ref 150–400)
RBC: 2.99 MIL/uL — ABNORMAL LOW (ref 3.87–5.11)
RDW: 21 % — ABNORMAL HIGH (ref 11.5–15.5)
WBC: 3 10*3/uL — ABNORMAL LOW (ref 4.0–10.5)
nRBC: 0 % (ref 0.0–0.2)

## 2021-10-20 LAB — MAGNESIUM: Magnesium: 2.2 mg/dL (ref 1.7–2.4)

## 2021-10-20 MED ORDER — ALTEPLASE 2 MG IJ SOLR
INTRAMUSCULAR | Status: AC
  Filled 2021-10-20: qty 2

## 2021-10-20 MED ORDER — POTASSIUM CHLORIDE CRYS ER 20 MEQ PO TBCR
40.0000 meq | EXTENDED_RELEASE_TABLET | Freq: Once | ORAL | Status: AC
  Administered 2021-10-20: 40 meq via ORAL
  Filled 2021-10-20: qty 2

## 2021-10-20 NOTE — Progress Notes (Signed)
Caitlin Greene underwent her upper enteroscopy and colonoscopy.  Looks like she was found to have some areas of colitis.  A lot of this might be from diverticulosis.  Biopsies were taken.  She had a relatively normal upper endoscopy.  She has a hiatal hernia with the Cameron's erosions.  Her CBC shows white cell count of 3 hemoglobin 10.2.  Platelet count 131,000.  Her potassium is little bit low at 3.2.  Her BUN is 6 creatinine 0.93.  She feels okay.  Hopefully she will have any bleeding.  Think she is on antibiotics for this area of colitis.  She has had the this was from low blood pressure.  I am unsure if this is truly the case.  She has had no fever.  There is no cough or shortness of breath.  There has been no obvious abdominal pain.  She has had no headache.  There is been no problems with bony pain.  Her vital signs are temperature 98.5.  Pulse 69.  Blood pressure 103/59.  Her head neck exam shows no ocular or oral lesions.  She has no adenopathy in the neck.  Lungs are clear.  Cardiac exam regular rate and rhythm.  Abdomen is soft.  Bowel sounds are slightly decreased.  There is no guarding or rebound tenderness.  Extremity shows no clubbing, cyanosis or edema.  Neurological exam is nonfocal.  I am glad that she had the additional endoscopic procedures.  We will see if this can help with the GI bleeding.  Hopefully she will not need a capsule endoscopy.  Hopefully, she will also be able to get out of the ICU.  I know she is gotten incredible care from all the staff in the ICU.  Lattie Haw, MD  Psalms 41:1

## 2021-10-20 NOTE — Progress Notes (Signed)
PROGRESS NOTE    Caitlin Greene  TMH:[id number] DOB: Jun 10, 1965 DOA: 10/15/2021 PCP: Biagio Borg, MD   Brief Narrative: 56 year old with past medical history significant for hyperlipidemia, chronic microcytic anemia, chronic anxiety/depression, GERD,  breast cancer metastasis to bone, history of GI bleed with recent admission who presents with progressive fatigue over the last few days associated with dark stool, epigastric pain.  GI was consulted, patient underwent endoscopy which revealed Cameron erosions, she developed recurrence of anemia and melena.  GI was reconsulted and patient underwent colonoscopy 10/19/2021 which show diverticulosis, erythematous friable ulcerated mucosa in the sigmoid colon, highly suspicious for ischemic colitis.  Patient also underwent a small bowel enteroscopy 6/13; benign appearing esophageal stricture.  Large hiatal hernia with a small Cameron's lesions.  Normal examination of the duodenal.  Hospital course also complicated by acute respiratory distress the afternoon of 10/16/2021 after initiation of IV Feraheme.  Patient became severely hypotensive, with nausea and dry here.  She was transferred to the stepdown unit.  She was found to have lactic acidosis lactate up to 3.6, leukopenia, hypotension, she required midodrine.  She was a started on broad-spectrum antibiotics.  Culture so far negative.  Plan to discontinue antibiotics 6/14    Assessment & Plan:   Principal Problem:   GI bleed Active Problems:   Anemia, B12 deficiency   GERD (gastroesophageal reflux disease)   Breast cancer (HCC)   Acute blood loss anemia (ABLA)   Thrombocytopenia (HCC)   Lactic acidosis   Hypotension  1-Recurrent GI bleed: Endoscopy showed Cameron's ulcer and reflux esophagitis Patient needs PPI twice daily at discharge, might need long term.  Underwent small bowel enteroscopy on colonoscopy 6/14.  Found to have benign esophageal stricture, erythematous mucosa of the sigmoid  colon consistent with ischemic colitis Plan to monitor hemoglobin today is stable at 10 Advance diet per GI  Acute blood loss anemia in the setting of GI bleed. Received 1 unit of packed red blood cells 10/18/2021. Hemoglobin is stable at 10  Acute thrombocytopenia: In the setting of GI bleed.  Improving  Severe hypotension, lactic acidosis Suspect related to allergic reaction to IV Feraheme Sepsis ruled out Cortisol level was normal.  Blood culture no growth to date.   IV antibiotics started 10/17/2021.  Plan to discontinue antibiotics today and monitor.  Acute hypoxic respiratory failure secondary to atelectasis: Resolved Chest x-ray:  Question IV fever heme allergy Reaction clinical status change with initiation of IV Feraheme. Receive IV Solu-Medrol, Pepcid, Benadryl  Hypokalemia: Replete orally  B12 deficiency started on supplement  Subclinical hypothyroidism: Free T40.8 With TSH 4.90  History of BRCA with mets to the bone She did Dr. Marin Olp follow-up   Estimated body mass index is 29.97 kg/m as calculated from the following:   Height as of this encounter: _0  (1.727 m).   Weight as of this encounter: 89.4 kg.   DVT prophylaxis: SCD Code Status: Full code Family Communication: care discussed with patient Disposition Plan:  Status is: Inpatient Remains inpatient appropriate because: Management of GI bleed.     Consultants:  GI Dr Marin Olp  Procedures:  Endoscopy Colonoscopy   Antimicrobials:    Subjective: She is feeling ok, denies abdominal pain.   Objective: Vitals:   10/19/21 1953 10/19/21 2100 10/20/21 0000 10/20/21 0422  BP:  99/63 123/64 (!) 103/59  Pulse:  98 99 76  Resp:  (!) 21 20 (!) 21  Temp: (!) 97.3 F (36.3 C)  99.2 F (37.3 C) 98.5 F (36.9  C)  TempSrc: Oral  Oral Oral  SpO2:  92% 97% 95%  Weight:      Height:        Intake/Output Summary (Last 24 hours) at 10/20/2021 0707 Last data filed at 10/20/2021 0655 Gross per 24  hour  Intake 2328.77 ml  Output 400 ml  Net 1928.77 ml   Filed Weights   10/16/21 0902 10/19/21 1242 10/19/21 1418  Weight: 89.4 kg 89.4 kg 89.4 kg    Examination:  General exam: Appears calm and comfortable  Respiratory system: Clear to auscultation. Respiratory effort normal. Cardiovascular system: S1 & S2 heard, RRR. No JVD, murmurs, rubs, gallops or clicks. No pedal edema. Gastrointestinal system: Abdomen is nondistended, soft and nontender. No organomegaly or masses felt. Normal bowel sounds heard. Central nervous system: Alert and oriented. No focal neurological deficits. Extremities: Symmetric 5 x 5 power.    Data Reviewed: I have personally reviewed following labs and imaging studies  CBC: Recent Labs  Lab 10/15/21 1402 10/15/21 2242 10/16/21 7829 10/16/21 1703 10/17/21 0530 10/18/21 0650 10/18/21 1637 10/18/21 1813 10/19/21 0259 10/20/21 0319  WBC 2.1* 2.7*  --  1.9* 3.3* 4.1  --   --   --  3.0*  NEUTROABS 1.3* 1.4*  --  1.6* 3.0 2.7  --   --   --   --   HGB 8.2* 7.6*   < > 9.3* 7.9* 7.4* 10.5* 11.7* 10.7* 10.2*  HCT 23.7* 22.5*   < > 27.7* 23.7* 23.2* 31.8* 35.1* 31.5* 30.1*  MCV 105.3* 108.7*  --  103.4* 104.9* 107.4*  --   --   --  100.7*  PLT 139* 144*  --  136* 126* 117*  --   --   --  131*   < > = values in this interval not displayed.   Basic Metabolic Panel: Recent Labs  Lab 10/15/21 2242 10/16/21 5621 10/16/21 1703 10/17/21 0517 10/18/21 0650 10/20/21 0319  NA 140 144 140 143 143 142  K 2.9* 3.0* 3.9 4.5 3.8 3.2*  CL 111 114* 114* 118* 118* 114*  CO2 _0 21* 22 24  GLUCOSE 85 92 156* 161* 116* 97  BUN _1 CREATININE 0.83 0.80 0.88 0.70 0.80 0.93  CALCIUM 8.1* 8.1* 7.8* 7.9* 8.1* 8.4*  MG 1.9  --  2.7* 2.4  --  2.2  PHOS  --   --  1.1*  --  1.7* 3.8   GFR: Estimated Creatinine Clearance: 78.1 mL/min (by C-G formula based on SCr of 0.93 mg/dL). Liver Function Tests: Recent Labs  Lab 10/15/21 2242 10/16/21 0613  10/16/21 1703 10/17/21 0517 10/18/21 0650  AST 13* 11* 16 14* 15  ALT _2 ALKPHOS 30* 34* 37* 32* 30*  BILITOT 0.7 0.9 0.9 0.8 0.7  PROT 5.7* 5.8* 6.0* 5.5* 5.3*  ALBUMIN 3.2* 3.3* 3.4* 3.2* 3.0*   Recent Labs  Lab 10/15/21 1402  LIPASE 24   No results for input(s): "AMMONIA" in the last 168 hours. Coagulation Profile: Recent Labs  Lab 10/15/21 2242 10/16/21 0613  INR 1.1 1.1   Cardiac Enzymes: No results for input(s): "CKTOTAL", "CKMB", "CKMBINDEX", "TROPONINI" in the last 168 hours. BNP (last 3 results) No results for input(s): "PROBNP" in the last 8760 hours. HbA1C: No results for input(s): "HGBA1C" in the last 72 hours. CBG: Recent Labs  Lab 10/16/21 0756 10/17/21 0752 10/18/21 0751 10/19/21 0813 10/19/21 1141  GLUCAP 94 129* 79 84  98   Lipid Profile: No results for input(s): "CHOL", "HDL", "LDLCALC", "TRIG", "CHOLHDL", "LDLDIRECT" in the last 72 hours. Thyroid Function Tests: No results for input(s): "TSH", "T4TOTAL", "FREET4", "T3FREE", "THYROIDAB" in the last 72 hours. Anemia Panel: No results for input(s): "VITAMINB12", "FOLATE", "FERRITIN", "TIBC", "IRON", "RETICCTPCT" in the last 72 hours. Sepsis Labs: Recent Labs  Lab 10/16/21 1703 10/16/21 1706 10/16/21 1950 10/17/21 0517 10/18/21 0650  PROCALCITON <0.10  --   --  0.15 <0.10  LATICACIDVEN  --  2.2* 3.6* 0.8  --     Recent Results (from the past 240 hour(s))  MRSA Next Gen by PCR, Nasal     Status: Abnormal   Collection Time: 10/16/21  2:07 PM   Specimen: Nasal Mucosa; Nasal Swab  Result Value Ref Range Status   MRSA by PCR Next Gen DETECTED (A) NOT DETECTED Final    Comment: CALLED KOONTZ,A _0  ON 10/16/21 BY LUZOLOP (NOTE) The GeneXpert MRSA Assay (FDA approved for NASAL specimens only), is one component of a comprehensive MRSA colonization surveillance program. It is not intended to diagnose MRSA infection nor to guide or monitor treatment for MRSA infections. Test  performance is not FDA approved in patients less than 60 years old. Performed at [facility], [place] [address], [place], [place] [address]   Culture, blood (Routine X 2) w Reflex to ID Panel     Status: None (Preliminary result)   Collection Time: 10/16/21  5:25 PM   Specimen: BLOOD  Result Value Ref Range Status   Specimen Description   Final    BLOOD BLOOD RIGHT FOREARM Performed at [facility] [address] [place], [place] [address]    Special Requests   Final    BOTTLES DRAWN AEROBIC ONLY Blood Culture results may not be optimal due to an inadequate volume of blood received in culture bottles Performed at [facility] [address] [place], [place] [address]    Culture   Final    NO GROWTH 3 DAYS Performed at [facility], [place] [address], [place], [place] [address]    Report Status PENDING  Incomplete  Culture, blood (Routine X 2) w Reflex to ID Panel     Status: None (Preliminary result)   Collection Time: 10/16/21  7:50 PM   Specimen: BLOOD  Result Value Ref Range Status   Specimen Description   Final    BLOOD BLOOD LEFT HAND Performed at [facility] [address], [place], [place] [address]    Special Requests   Final    IN PEDIATRIC BOTTLE Blood Culture adequate volume Performed at [facility] [address], [place], [place] [address]    Culture   Final    NO GROWTH 3 DAYS Performed at [facility], [place] [address], [place], [place] [address]    Report Status PENDING  Incomplete         Radiology Studies: No results found.      Scheduled Meds:  sodium chloride   Intravenous Once   sodium chloride   Intravenous Once   Chlorhexidine Gluconate Cloth  6 each Topical Daily   cyanocobalamin  1,000 mcg Intramuscular Daily   FLUoxetine  60 mg Oral Daily   folic acid  1 mg Oral Daily    morphine injection  4 mg Intravenous Once   mupirocin  ointment  1 application  Nasal BID   pantoprazole  40 mg Oral BID  potassium chloride  40 mEq Oral Once   senna-docusate  2 tablet Oral BID   sucralfate  1 g Oral TID WC & HS   vitamin B-12  1,000 mcg Oral Daily   Continuous Infusions:  sodium chloride 50 mL/hr at 10/20/21 0655   ceFEPime (MAXIPIME) IV Stopped (10/20/21 0035)   vancomycin 200 mL/hr at 10/20/21 0655     LOS: 5 days    Time spent: 35 minutes.     Elmarie Shiley, MD [facility]   If 7PM-7AM, please contact night-coverage [email or web address]  10/20/2021, 7:07 AM

## 2021-10-20 NOTE — Progress Notes (Signed)
Patient ID: Caitlin Greene, female   DOB: [birth year], 56 y.o.   MRN: [id number]    Progress Note   Subjective   Day # 5  CC; metastatic breast cancer to bone, GI bleed, melena  Labs today-WBC 3.0/hemoglobin 10.2/hematocrit 30.1 post transfusions x2, platelets 131 Potassium 3.2  Enteroscopy yesterday-large hiatal hernia with Lysbeth Galas erosions normal stomach and duodenum Colonoscopy yesterday-multiple diverticuli localized area of erythematous friable focally ulcerated mucosa in the sigmoid colon-colitis is within the area of more dense diverticulosis-biopsies pending Rule out ischemic colitis, scad versus other One 1 mm polyp cecum resected.  Patient feeling okay, no abdominal pain, no bowel movements or bleeding since procedure.   Objective   Vital signs in last 24 hours: Temp:  [97.3 F (36.3 C)-99.2 F (37.3 C)] 98.4 F (36.9 C) (06/14 0752) Pulse Rate:  [69-130] 83 (06/14 0900) Resp:  [14-24] 18 (06/14 0900) BP: (99-152)/(59-91) 110/67 (06/14 0900) SpO2:  [92 %-100 %] 98 % (06/14 0900) Weight:  [89.4 kg] 89.4 kg (06/13 1418) Last BM Date : 10/19/21 General: Older white female in NAD Heart:  Regular rate and rhythm; no murmurs Lungs: Respirations even and unlabored, lungs CTA bilaterally Abdomen:  Soft, nontender and nondistended. Normal bowel sounds. Extremities:  Without edema. Neurologic:  Alert and oriented,  grossly normal neurologically. Psych:  Cooperative. Normal mood and affect.  Intake/Output from previous day: 06/13 0701 - 06/14 0700 In: 2328.8 [I.V.:1560.5; IV Piggyback:768.3] Out: 400 [Urine:400] Intake/Output this shift: Total I/O In: 578 [P.O.:300; I.V.:101.7; IV Piggyback:176.3] Out: -   Lab Results: Recent Labs    10/18/21 0650 10/18/21 1637 10/18/21 1813 10/19/21 0259 10/20/21 0319  WBC 4.1  --   --   --  3.0*  HGB 7.4*   < > 11.7* 10.7* 10.2*  HCT 23.2*   < > 35.1* 31.5* 30.1*  PLT 117*  --   --   --  131*   < > = values in this interval  not displayed.   BMET Recent Labs    10/18/21 0650 10/20/21 0319  NA 143 142  K 3.8 3.2*  CL 118* 114*  CO2 22 24  GLUCOSE 116* 97  BUN 11 6  CREATININE 0.80 0.93  CALCIUM 8.1* 8.4*   LFT Recent Labs    10/18/21 0650  PROT 5.3*  ALBUMIN 3.0*  AST 15  ALT 13  ALKPHOS 30*  BILITOT 0.7   PT/INR No results for input(s): "LABPROT", "INR" in the last 72 hours.     Assessment / Plan:    #75 56 year old white female with known metastatic breast cancer to bone, admitted with fatigue and melena, similar admission in May 2023  EGD 10/16/2021 with 9 cm hiatal hernia and multiple Cameron erosions, grade B esophagitis, no stigmata of active bleeding  She continued to have drift in hemoglobin and melena and therefore proceeded with enteroscopy and colonoscopy yesterday  Enteroscopy negative, colonoscopy found to have diverticulosis and a segment of colitis in the descending colon area of dense diverticuli Biopsies were taken and are pending-possible ischemic colitis, consider SCAD versus other 1 small polyp also removed biopsy pending  Hemoglobin has been stable over the past 24 hours, no evidence for ongoing active bleeding.  #2 hypotension, lactic acidosis, concern for possible sepsis versus allergic reaction to Feraheme #3 mild pancytopenia-likely related to underlying malignancy and therapy  Blood cultures negative Hypotension and lactic acidosis resolved, .  Midodrine has been discontinued-pressure stable  Plan; will advance to solid diet today She needs to  stay on oral twice daily PPI at discharge and long-term No specific therapy for the colitis until biopsies return, discussed with patient that if this is an ischemic colitis it will heal without any specific medication.  She voices that she is not interested in hiatal hernia repair at this point and her other medical issues  Hopefully if she does well today she will be able to be discharged tomorrow, and already has  follow-up appointment scheduled with Dr. Ardis Hughs for next week.       Principal Problem:   GI bleed Active Problems:   Anemia, B12 deficiency   GERD (gastroesophageal reflux disease)   Breast cancer (HCC)   Acute blood loss anemia (ABLA)   Thrombocytopenia (HCC)   Lactic acidosis   Hypotension     LOS: 5 days   Litha Lamartina  PA-C 10/20/2021, 9:19 AM

## 2021-10-21 DIAGNOSIS — K559 Vascular disorder of intestine, unspecified: Secondary | ICD-10-CM

## 2021-10-21 DIAGNOSIS — D500 Iron deficiency anemia secondary to blood loss (chronic): Secondary | ICD-10-CM | POA: Diagnosis not present

## 2021-10-21 DIAGNOSIS — K921 Melena: Secondary | ICD-10-CM | POA: Diagnosis not present

## 2021-10-21 LAB — CULTURE, BLOOD (ROUTINE X 2)
Culture: NO GROWTH
Culture: NO GROWTH
Special Requests: ADEQUATE

## 2021-10-21 LAB — BASIC METABOLIC PANEL
Anion gap: 5 (ref 5–15)
BUN: 9 mg/dL (ref 6–20)
CO2: 24 mmol/L (ref 22–32)
Calcium: 8.9 mg/dL (ref 8.9–10.3)
Chloride: 112 mmol/L — ABNORMAL HIGH (ref 98–111)
Creatinine, Ser: 0.85 mg/dL (ref 0.44–1.00)
GFR, Estimated: >60 mL/min (ref >60)
Glucose, Bld: 96 mg/dL (ref 70–99)
Potassium: 3.5 mmol/L (ref 3.5–5.1)
Sodium: 141 mmol/L (ref 135–145)

## 2021-10-21 LAB — CBC
HCT: 31.1 % — ABNORMAL LOW (ref 36.0–46.0)
Hemoglobin: 10.5 g/dL — ABNORMAL LOW (ref 12.0–15.0)
MCH: 33.9 pg (ref 26.0–34.0)
MCHC: 33.8 g/dL (ref 30.0–36.0)
MCV: 100.3 fL — ABNORMAL HIGH (ref 80.0–100.0)
Platelets: 137 10*3/uL — ABNORMAL LOW (ref 150–400)
RBC: 3.1 MIL/uL — ABNORMAL LOW (ref 3.87–5.11)
RDW: 20.2 % — ABNORMAL HIGH (ref 11.5–15.5)
WBC: 3 10*3/uL — ABNORMAL LOW (ref 4.0–10.5)
nRBC: 0 % (ref 0.0–0.2)

## 2021-10-21 LAB — GLUCOSE, CAPILLARY: Glucose-Capillary: 90 mg/dL (ref 70–99)

## 2021-10-21 LAB — SURGICAL PATHOLOGY

## 2021-10-21 MED ORDER — FOLIC ACID 1 MG PO TABS
2.0000 mg | ORAL_TABLET | Freq: Every day | ORAL | Status: DC
  Administered 2021-10-21: 2 mg via ORAL
  Filled 2021-10-21: qty 2

## 2021-10-21 MED ORDER — CALCIUM 600 MG PO TABS: 600.0000 mg | ORAL_TABLET | Freq: Two times a day (BID) | ORAL | 0 refills | Status: AC

## 2021-10-21 MED ORDER — SUCRALFATE 1 G PO TABS: 1.0000 g | ORAL_TABLET | Freq: Three times a day (TID) | ORAL | 0 refills | Status: DC

## 2021-10-21 MED ORDER — POTASSIUM CHLORIDE CRYS ER 20 MEQ PO TBCR
40.0000 meq | EXTENDED_RELEASE_TABLET | Freq: Once | ORAL | Status: AC
  Administered 2021-10-21: 40 meq via ORAL
  Filled 2021-10-21: qty 2

## 2021-10-21 MED ORDER — VITAMIN B-12 1000 MCG PO TABS: 1000.0000 ug | ORAL_TABLET | Freq: Every day | ORAL | 0 refills | Status: AC

## 2021-10-21 MED ORDER — FOLIC ACID 1 MG PO TABS: 2.0000 mg | ORAL_TABLET | Freq: Every day | ORAL | 0 refills | Status: DC

## 2021-10-21 MED ORDER — PANTOPRAZOLE SODIUM 40 MG PO TBEC: 40.0000 mg | DELAYED_RELEASE_TABLET | Freq: Two times a day (BID) | ORAL | 2 refills | Status: DC

## 2021-10-21 MED ORDER — ORAL CARE MOUTH RINSE: 15.0000 mL | OROMUCOSAL | Status: DC | PRN

## 2021-10-21 NOTE — Discharge Summary (Addendum)
Physician Discharge Summary   Patient: Caitlin Greene MRN: [id number] DOB: 1965-05-15  Admit date:     10/15/2021  Discharge date: 10/21/21  Discharge Physician: Elmarie Shiley   PCP: Biagio Borg, MD   Recommendations at discharge:   Needs CBC to monitor hb.  Biopsy colon results pending.   Discharge Diagnoses: Principal Problem:   GI bleed Active Problems:   Anemia, B12 deficiency   GERD (gastroesophageal reflux disease)   Breast cancer (HCC)   Acute blood loss anemia (ABLA)   Thrombocytopenia (HCC)   Lactic acidosis   Hypotension  Resolved Problems:   * No resolved hospital problems. *  Hospital Course: 56 year old with past medical history significant for hyperlipidemia, chronic microcytic anemia, chronic anxiety/depression, GERD,  breast cancer metastasis to bone, history of GI bleed with recent admission who presents with progressive fatigue over the last few days associated with dark stool, epigastric pain.   GI was consulted, patient underwent endoscopy which revealed Cameron erosions, she developed recurrence of anemia and melena.  GI was reconsulted and patient underwent colonoscopy 10/19/2021 which show diverticulosis, erythematous friable ulcerated mucosa in the sigmoid colon, highly suspicious for ischemic colitis.  Patient also underwent a small bowel enteroscopy 6/13; benign appearing esophageal stricture.  Large hiatal hernia with a small Cameron's lesions.  Normal examination of the duodenal.   Hospital course also complicated by acute respiratory distress the afternoon of 10/16/2021 after initiation of IV Feraheme.  Patient became severely hypotensive, with nausea and dry here.  She was transferred to the stepdown unit.  She was found to have lactic acidosis lactate up to 3.6, leukopenia, hypotension, she required midodrine.  She was a started on broad-spectrum antibiotics.  Culture so far negative.  Plan to discontinue antibiotics 6/14     Assessment and Plan:    1-Recurrent GI bleed: Endoscopy showed Cameron's ulcer and reflux esophagitis Patient needs PPI twice daily at discharge, might need long term.  Underwent small bowel enteroscopy on colonoscopy 6/14.  Found to have benign esophageal stricture, erythematous mucosa of the sigmoid colon consistent with ischemic colitis Hb remain stable. BP stable. She has been tolerating diet. Stable for discharge. She will follow results of Bowel bx with GI>    Acute blood loss anemia in the setting of GI bleed. Received 1 unit of packed red blood cells 10/18/2021. Hemoglobin is stable at 10   Acute thrombocytopenia: In the setting of GI bleed.  Improving   Severe hypotension, lactic acidosis Suspect related to allergic reaction to IV Feraheme Sepsis ruled out Cortisol level was normal.  Blood culture no growth to date.   IV antibiotics started 10/17/2021.  Plan to discontinue antibiotics today and monitor. Vitals stable.   Acute hypoxic respiratory failure secondary to atelectasis: Resolved Chest x-ray: There are no signs of pulmonary edema or focal pulmonary consolidation.   Question IV fever heme allergy Reaction clinical status change with initiation of IV Feraheme. Received IV Solu-Medrol, Pepcid, Benadryl   Hypokalemia: Replete orally   B12 deficiency started on supplement. Continue at discharge.    Subclinical hypothyroidism: Free T40.8 With TSH 4.90   History of BRCA with mets to the bone Dr. Marin Olp follow-up            Consultants: GI, Dr Marin Olp.  Procedures performed: Endoscopy, enteroscopy  Disposition: Home Diet recommendation:  Discharge Diet Orders (From admission, onward)     Start     Ordered   10/21/21 0000  Diet - low sodium heart healthy  10/21/21 0930           Cardiac diet DISCHARGE MEDICATION: Allergies as of 10/21/2021       Reactions   Amoxicillin-pot Clavulanate Hives, Itching   Penicillins Hives, Itching, Other (See Comments)   Codeine  Nausea And Vomiting   Needs pre-meds   Iron    Patient develops hypotension, resp distress after infusion of ferra heme.         Medication List     STOP taking these medications    famciclovir 250 MG tablet Commonly known as: [place] COVID-19 Vac Bivalent injection Generic drug: COVID-19 mRNA bivalent vaccine Therapist, music)       TAKE these medications    acetaminophen 650 MG CR tablet Commonly known as: TYLENOL Take 650 mg by mouth every 8 (eight) hours as needed for pain.   calcium carbonate 600 MG tablet Commonly known as: OS-CAL Take 1 tablet (600 mg total) by mouth 2 (two) times daily. What changed:  medication strength how much to take when to take this   FLUoxetine 20 MG capsule Commonly known as: PROZAC TAKE 3 CAPSULES(60 MG) BY MOUTH DAILY What changed:  how much to take when to take this additional instructions   folic acid 1 MG tablet Commonly known as: FOLVITE Take 2 tablets (2 mg total) by mouth daily.   fulvestrant 250 MG/5ML injection Commonly known as: FASLODEX Inject 500 mg into the muscle every 30 (thirty) days. One injection each buttock over 1-2 minutes. Warm prior to use.   hydrOXYzine 25 MG tablet Commonly known as: ATARAX Take 25 mg by mouth 3 (three) times daily as needed for anxiety.   Ibrance 100 MG tablet Generic drug: palbociclib Take 100 mg by mouth See admin instructions. 158m daily on day 1-21 of cycle. 7 days off.  Repeat.   ondansetron 4 MG tablet Commonly known as: ZOFRAN Take 1 tablet (4 mg total) by mouth every 4 (four) hours as needed. What changed: reasons to take this   oxyCODONE 5 MG immediate release tablet Commonly known as: Oxy IR/ROXICODONE Take 1 tablet (5 mg total) by mouth every 8 (eight) hours as needed for severe pain.   pantoprazole 40 MG tablet Commonly known as: PROTONIX Take 1 tablet (40 mg total) by mouth 2 (two) times daily.   polyethylene glycol 17 g packet Commonly known as: MIRALAX  / GLYCOLAX Take 17 g by mouth daily as needed. What changed: reasons to take this   sucralfate 1 g tablet Commonly known as: Carafate Take 1 tablet (1 g total) by mouth 4 (four) times daily -  with meals and at bedtime.   vitamin B-12 1000 MCG tablet Commonly known as: CYANOCOBALAMIN Take 1 tablet (1,000 mcg total) by mouth daily.        Follow-up Information     JBiagio Borg MD Follow up in 1 week(s).   Specialties: Internal Medicine, Radiology Contact information: [address] [phone]               Discharge Exam: FDanley DankerWeights   10/16/21 0902 10/19/21 1242 10/19/21 1418  Weight: 89.4 kg 89.4 kg 89.4 kg   General; NAD Lung; CTA  Condition at discharge: stable  The results of significant diagnostics from this hospitalization (including imaging, microbiology, ancillary and laboratory) are listed below for reference.   Imaging Studies: [place]KG SITE RITE  Result Date: 10/16/2021 If Site Rite image not attached, placement could not be confirmed  due to current cardiac rhythm.  DG CHEST PORT 1 VIEW  Result Date: 10/16/2021 CLINICAL DATA:  Hypoxia EXAM: PORTABLE CHEST 1 VIEW COMPARISON:  09/12/2021 FINDINGS: Cardiac size is within normal limits. There are no signs of pulmonary edema. Increased density in the retrocardiac region may be due to hiatal hernia. There is no pleural effusion or pneumothorax. IMPRESSION: There are no signs of pulmonary edema or focal pulmonary consolidation. Electronically Signed   By: Elmer Picker M.D.   On: 10/16/2021 14:27    Microbiology: Results for orders placed or performed during the hospital encounter of 10/15/21  MRSA Next Gen by PCR, Nasal     Status: Abnormal   Collection Time: 10/16/21  2:07 PM   Specimen: Nasal Mucosa; Nasal Swab  Result Value Ref Range Status   MRSA by PCR Next Gen DETECTED (A) NOT DETECTED Final    Comment: CALLED KOONTZ,A @1612  ON 10/16/21 BY LUZOLOP (NOTE) The  GeneXpert MRSA Assay (FDA approved for NASAL specimens only), is one component of a comprehensive MRSA colonization surveillance program. It is not intended to diagnose MRSA infection nor to guide or monitor treatment for MRSA infections. Test performance is not FDA approved in patients less than 58 years old. Performed at [facility], [place] [address], [place], [place] [address]   Culture, blood (Routine X 2) w Reflex to ID Panel     Status: None   Collection Time: 10/16/21  5:25 PM   Specimen: BLOOD  Result Value Ref Range Status   Specimen Description   Final    BLOOD BLOOD RIGHT FOREARM Performed at [facility] [address] [place], [place] [address]    Special Requests   Final    BOTTLES DRAWN AEROBIC ONLY Blood Culture results may not be optimal due to an inadequate volume of blood received in culture bottles Performed at [facility] [address] [place], [place] [address]    Culture   Final    NO GROWTH 5 DAYS Performed at [facility], [place] [address] [place], [place] [address]    Report Status 10/21/2021 FINAL  Final  Culture, blood (Routine X 2) w Reflex to ID Panel     Status: None   Collection Time: 10/16/21  7:50 PM   Specimen: BLOOD  Result Value Ref Range Status   Specimen Description   Final    BLOOD BLOOD LEFT HAND Performed at [facility] [address] [place], [place] [address]    Special Requests   Final    IN PEDIATRIC BOTTLE Blood Culture adequate volume Performed at [facility] [address] [place], [place] [address]    Culture   Final    NO GROWTH 5 DAYS Performed at [facility], [place] [address] [place], [place] [address]    Report Status 10/21/2021 FINAL  Final    Labs: CBC: Recent Labs  Lab 10/15/21 1402 10/15/21 2242 10/16/21 8757 10/16/21 1703 10/17/21 0530 10/18/21 0650 10/18/21 1637 10/18/21 1813  10/19/21 0259 10/20/21 0319 10/21/21 0258  WBC 2.1* 2.7*  --  1.9* 3.3* 4.1  --   --   --  3.0* 3.0*  NEUTROABS 1.3* 1.4*  --  1.6* 3.0 2.7  --   --   --   --   --   HGB 8.2* 7.6*   < > 9.3* 7.9* 7.4* 10.5* 11.7* 10.7* 10.2* 10.5*  HCT 23.7* 22.5*   < >  27.7* 23.7* 23.2* 31.8* 35.1* 31.5* 30.1* 31.1*  MCV 105.3* 108.7*  --  103.4* 104.9* 107.4*  --   --   --  100.7* 100.3*  PLT 139* 144*  --  136* 126* 117*  --   --   --  131* 137*   < > = values in this interval not displayed.   Basic Metabolic Panel: Recent Labs  Lab 10/15/21 2242 10/16/21 5800 10/16/21 1703 10/17/21 0517 10/18/21 0650 10/20/21 0319 10/21/21 0258  NA 140   < > 140 143 143 142 141  K 2.9*   < > 3.9 4.5 3.8 3.2* 3.5  CL 111   < > 114* 118* 118* 114* 112*  CO2 24   < > 22 21* 22 24 24   GLUCOSE 85   < > 156* 161* 116* 97 96  BUN 10   < > 9 9 11 6 9   CREATININE 0.83   < > 0.88 0.70 0.80 0.93 0.85  CALCIUM 8.1*   < > 7.8* 7.9* 8.1* 8.4* 8.9  MG 1.9  --  2.7* 2.4  --  2.2  --   PHOS  --   --  1.1*  --  1.7* 3.8  --    < > = values in this interval not displayed.   Liver Function Tests: Recent Labs  Lab 10/15/21 2242 10/16/21 0613 10/16/21 1703 10/17/21 0517 10/18/21 0650  AST 13* 11* 16 14* 15  ALT 11 11 13 13 13   ALKPHOS 30* 34* 37* 32* 30*  BILITOT 0.7 0.9 0.9 0.8 0.7  PROT 5.7* 5.8* 6.0* 5.5* 5.3*  ALBUMIN 3.2* 3.3* 3.4* 3.2* 3.0*   CBG: Recent Labs  Lab 10/18/21 0751 10/19/21 0813 10/19/21 1141 10/20/21 0751 10/21/21 0801  GLUCAP 79 84 98 96 90    Discharge time spent: greater than 30 minutes.  Signed: Elmarie Shiley, MD [facility] 10/21/2021

## 2021-10-21 NOTE — Progress Notes (Signed)
     [facility] Progress Note  CC:   Metastatic breast cancer to bone, GI bleed, melena    Subjective: She feels well this morning.  No nausea or vomiting.  No abdominal pain.  No bowel movement or melena since her colonoscopy.  She is tolerating a regular diet.   Objective:  Vital signs in last 24 hours: Temp:  [97.7 F (36.5 C)-98.7 F (37.1 C)] 97.7 F (36.5 C) (06/15 0400) Pulse Rate:  [69-104] 82 (06/15 0100) Resp:  [9-27] 9 (06/15 0600) BP: (106-130)/(66-87) 106/71 (06/15 0000) SpO2:  [92 %-98 %] 94 % (06/15 0100) Last BM Date : 10/19/21 General: Alert 56 year old female in no acute distress. Heart: Regular rate and rhythm, no murmurs. Pulm: Breath sounds clear throughout. Abdomen: Soft, nondistended.  Nontender.  Positive bowel sounds to all 4 quadrants. Extremities:  Without edema. Neurologic:  Alert and  oriented x 4. Grossly normal neurologically. Psych:  Alert and cooperative. Normal mood and affect.  Intake/Output from previous day: 06/14 0701 - 06/15 0700 In: 1078.8 [P.O.:780; I.V.:122.5; IV Piggyback:176.3] Out: -  Intake/Output this shift: No intake/output data recorded.  Lab Results: Recent Labs    10/19/21 0259 10/20/21 0319 10/21/21 0258  WBC  --  3.0* 3.0*  HGB 10.7* 10.2* 10.5*  HCT 31.5* 30.1* 31.1*  PLT  --  131* 137*   BMET Recent Labs    10/20/21 0319 10/21/21 0258  NA 142 141  K 3.2* 3.5  CL 114* 112*  CO2 24 24  GLUCOSE 97 96  BUN 6 9  CREATININE 0.93 0.85  CALCIUM 8.4* 8.9   LFT No results for input(s): "PROT", "ALBUMIN", "AST", "ALT", "ALKPHOS", "BILITOT", "BILIDIR", "IBILI" in the last 72 hours. PT/INR No results for input(s): "LABPROT", "INR" in the last 72 hours. Hepatitis Panel No results for input(s): "HEPBSAG", "HCVAB", "HEPAIGM", "HEPBIGM" in the last 72 hours.  No results found.  Assessment / Plan:  20) 56 year old female with metastatic breast cancer to bone admitted to the hospital with GI  bleed and anemia. EGD 6/9 showed reflux esophagitis, a 9cm hiatal hernia with multiple Cameron lesions. Enteroscopy 6/13 showed a large hiatal hernia with Lysbeth Galas erosions, normal stomach and duodenum. Colonoscopy 6/13 showed diverticulosis, localized area of erythematous friable focally ulcerated mucosa in the sigmoid colon-colitis is within the area of more dense diverticulosis. Rule out ischemic colitis vs  segmental colitis associated with diverticulosis (SCAD). One 1 mm polyp cecum resected. Biopsies pending. Hg stable 10.2 -> 10.5. B12 level 179. Normal iron levels.  No further melena or BM post colonoscopy.  Afebrile.  Hemodynamically stable. -Await colonoscopy biopsy results  -Continue Pantoprazole '40mg'$  po bid  -Continue B12 replacement  -Patient does not wish to purse hiatal hernia surgery  -Patient has a follow-up appointment scheduled with Dr. Edison Nasuti 10/26/2021 -Await further recommendations per Dr. Tarri Glenn  2) Thrombocytopenia, secondary to underlying malignancy and therapy PLT 137.  Normal liver per CT 07/2021.    Principal Problem:   GI bleed Active Problems:   Anemia, B12 deficiency   GERD (gastroesophageal reflux disease)   Breast cancer (HCC)   Acute blood loss anemia (ABLA)   Thrombocytopenia (HCC)   Lactic acidosis   Hypotension     LOS: 6 days   Noralyn Pick  10/21/2021, 08:57AM

## 2021-10-21 NOTE — Progress Notes (Signed)
  Transition of Care [facility]) Screening Note   Patient Details  Name: Caitlin Greene Date of Birth: 13-Jan-1966   [facility] CM/SW Contact:    Dessa Phi, RN Phone Number: 10/21/2021, 10:16 AM    Transition of Care Department [facility]) has reviewed patient and no TOC needs have been identified at this time. We will continue to monitor patient advancement through interdisciplinary progression rounds. If new patient transition needs arise, please place a TOC consult.

## 2021-10-21 NOTE — Progress Notes (Signed)
Caitlin Greene is doing quite well.  She is relatively stable.  I do not think she is having any bleeding.  Her hemoglobin is stable at 10.5.  White cell count is 3.  Her platelet count is 137,000.  Her electrolytes all look pretty normal.  She is eating.  She has had no problems with fever.  There is no abdominal pain.  She has had no cough.  Pathology from her recent endoscopy is still pending.  It sounds like she might go home today.  I certainly would have no problems with this.  Again, this is an issue that is thankfully, not oncologic.  Hopefully, she will not need to have hiatal hernia surgery in the future.  She has had no fever.  She has had no obvious bleeding.  She has not had any bowel movements since the colonoscopy.  Her vital signs show a temperature of 97.7.  Pulse 82.  Blood pressure 106/71.  Her lungs are clear.  Cardiac exam regular rate and rhythm.  Abdomen is soft.  Bowel sounds are slightly decreased.  There is no guarding or rebound tenderness.  Extremity shows no clubbing, cyanosis or edema.  Neurological exam is nonfocal.  Hopefully, Caitlin Greene will be able to go home.  I know that Gastroenterology has done a fantastic job in trying to pinpoint this bleeding.  Her hemoglobin is stable.  I would make sure that she is on the folic acid when she does go home.  I do appreciate everybody's help in the ICU.  As always, they show some much compassion for our patients.  Lattie Haw, MD  [facility] 1:7

## 2021-10-22 ENCOUNTER — Other Ambulatory Visit: Payer: Self-pay | Admitting: Physician Assistant

## 2021-10-22 ENCOUNTER — Telehealth: Payer: Self-pay | Admitting: *Deleted

## 2021-10-22 NOTE — Telephone Encounter (Signed)
Transition Care Management Unsuccessful Follow-up Telephone Call  Date of discharge and from where:  10/21/21 AT [facility]  Attempts:  1st Attempt  Reason for unsuccessful TCM follow-up call:  Left voice message   Hubert Azure RN, MSN RN Care Management Coordinator  [phone] [email or web address]

## 2021-10-24 ENCOUNTER — Encounter (HOSPITAL_COMMUNITY): Payer: Self-pay | Admitting: Gastroenterology

## 2021-10-26 ENCOUNTER — Encounter: Payer: Self-pay | Admitting: Gastroenterology

## 2021-10-26 ENCOUNTER — Ambulatory Visit (INDEPENDENT_AMBULATORY_CARE_PROVIDER_SITE_OTHER): Payer: BC Managed Care – PPO | Admitting: Gastroenterology

## 2021-10-26 VITALS — BP 96/68 | HR 105 | Ht 68.0 in | Wt 193.0 lb

## 2021-10-26 DIAGNOSIS — K449 Diaphragmatic hernia without obstruction or gangrene: Secondary | ICD-10-CM

## 2021-10-26 DIAGNOSIS — D509 Iron deficiency anemia, unspecified: Secondary | ICD-10-CM

## 2021-10-26 DIAGNOSIS — K922 Gastrointestinal hemorrhage, unspecified: Secondary | ICD-10-CM

## 2021-10-26 MED ORDER — IRON 325 (65 FE) MG PO TABS: 1.0000 | ORAL_TABLET | Freq: Every day | ORAL | 0 refills | Status: AC

## 2021-10-26 NOTE — Progress Notes (Signed)
Review of pertinent gastrointestinal problems: 1.  Large hiatal hernia with classic appearing [place] erosions causing GI bleeding, melena.  This was noted by EGD 09/2021 Dr. Ardis Hughs.  Also EGD 10/2021 Dr. Candis Schatz.  Also upper enteroscopy 10/2021 Dr. Tarri Glenn. 2.  10 cm segment of ischemic colitis (likely from her anaphylactic reaction to IV iron), colonoscopy 10/2021 while she was hospitalized with GI bleeding.  Mild colitis endoscopically, biopsies consistent with ischemic damage. 3.  IV iron allergy/reaction 10/2021: Hypotension respiratory distress, anaphylaxis 4.  Adenomatous colon polyp.  Colonoscopy 10/2021 while admitted in the hospital, see above, 1 mm tubular adenoma was removed.  Recall colonoscopy should be 7 to 10 years.  HPI: This is a very pleasant 56 year old woman   She was recently admitted with GI bleeding, melena.  Her admitting hemoglobin was 7.9.  She received 2 units of blood.  I think her bleeding was from [facility] erosions, well-documented.  She was not taking the proton pump inhibitor twice daily or at all.  She was only on Carafate.  Since discharge she is having brown stools and she feels relatively fine.  She has been taking proton pump inhibitor Protonix twice daily.  She is also on Carafate 4 times daily.   ROS: complete GI ROS as described in HPI, all other review negative.  Constitutional:  No unintentional weight loss   Past Medical History:  Diagnosis Date   ALLERGIC RHINITIS    ANEMIA-IRON DEFICIENCY    ANXIETY    BREAST CANCER, HX OF 01/21/2007   at 56yo   DEPRESSION    GERD    HYPERLIPIDEMIA    Metastatic cancer to bone ([place]) dx'd 10/24/2019   recurrent breast ca   RENAL CALCULUS    rt breast ca dx'd 1988    Past Surgical History:  Procedure Laterality Date   BIOPSY  03/21/2020   Procedure: BIOPSY;  Surgeon: Thornton Park, MD;  Location: [facility];  Service: Gastroenterology;;   BIOPSY  09/13/2021   Procedure: BIOPSY;  Surgeon: Milus Banister, MD;  Location: [facility];  Service: Gastroenterology;;   BIOPSY  10/19/2021   Procedure: BIOPSY;  Surgeon: Thornton Park, MD;  Location: [facility];  Service: Gastroenterology;;   BREAST ENHANCEMENT SURGERY     COLONOSCOPY WITH PROPOFOL N/A 10/19/2021   Procedure: COLONOSCOPY WITH PROPOFOL;  Surgeon: Thornton Park, MD;  Location: [facility];  Service: Gastroenterology;  Laterality: N/A;   ENTEROSCOPY N/A 10/19/2021   Procedure: ENTEROSCOPY;  Surgeon: Thornton Park, MD;  Location: [facility];  Service: Gastroenterology;  Laterality: N/A;   ESOPHAGOGASTRODUODENOSCOPY N/A 10/16/2021   Procedure: ESOPHAGOGASTRODUODENOSCOPY (EGD);  Surgeon: Daryel November, MD;  Location: [facility];  Service: Gastroenterology;  Laterality: N/A;   ESOPHAGOGASTRODUODENOSCOPY (EGD) WITH PROPOFOL N/A 03/21/2020   Procedure: ESOPHAGOGASTRODUODENOSCOPY (EGD) WITH PROPOFOL;  Surgeon: Thornton Park, MD;  Location: [facility];  Service: Gastroenterology;  Laterality: N/A;   ESOPHAGOGASTRODUODENOSCOPY (EGD) WITH PROPOFOL N/A 09/13/2021   Procedure: ESOPHAGOGASTRODUODENOSCOPY (EGD) WITH PROPOFOL;  Surgeon: Milus Banister, MD;  Location: [facility];  Service: Gastroenterology;  Laterality: N/A;   MASTECTOMY     right   POLYPECTOMY  10/19/2021   Procedure: POLYPECTOMY;  Surgeon: Thornton Park, MD;  Location: [facility];  Service: Gastroenterology;;   TEMPOROMANDIBULAR JOINT SURGERY     Left   TONSILLECTOMY      Current Outpatient Medications  Medication Instructions   acetaminophen (TYLENOL) 650 mg, Oral, Every 8 hours PRN   calcium carbonate (OS-CAL) 600 mg, Oral, 2 times daily  FLUoxetine (PROZAC) 20 MG capsule TAKE 3 CAPSULES(60 MG) BY MOUTH DAILY   folic acid (FOLVITE) 2 mg, Oral, Daily   fulvestrant (FASLODEX) 500 mg, Intramuscular, Every 30 days, One injection each buttock over 1-2 minutes. Warm prior to use.   hydrOXYzine (ATARAX) 25 mg, Oral, 3 times daily PRN    Ibrance 100 mg, Oral, See admin instructions, '100mg'$  daily on day 1-21 of cycle. 7 days off.  Repeat.   ondansetron (ZOFRAN) 4 mg, Oral, Every 4 hours PRN   oxyCODONE (OXY IR/ROXICODONE) 5 mg, Oral, Every 8 hours PRN   pantoprazole (PROTONIX) 40 mg, Oral, 2 times daily   polyethylene glycol (MIRALAX / GLYCOLAX) 17 g, Oral, Daily PRN   sucralfate (CARAFATE) 1 g, Oral, 3 times daily with meals & bedtime   vitamin B-12 (CYANOCOBALAMIN) 1,000 mcg, Oral, Daily    Allergies as of 10/26/2021 - Review Complete 10/26/2021  Allergen Reaction Noted   Amoxicillin-pot clavulanate Hives and Itching 11/13/2010   Penicillins Hives, Itching, and Other (See Comments) 01/21/2007   Codeine Nausea And Vomiting 10/04/2017   Iron  10/20/2021    Family History  Problem Relation Age of Onset   Colon polyps Mother    Cancer Mother        Uterine Cancer   Hypertension Mother    Dementia Mother    Colon polyps Father    COPD Father        smoked   Hypertension Other    Diabetes Other    Asthma Sister     Social History   Socioeconomic History   Marital status: Widowed    Spouse name: Not on file   Number of children: 0   Years of education: Not on file   Highest education level: Not on file  Occupational History   Occupation: BANKRUPTCY ANALYST    Employer: [employer]  Tobacco Use   Smoking status: Never   Smokeless tobacco: Never  Vaping Use   Vaping Use: Never used  Substance and Sexual Activity   Alcohol use: No    Alcohol/week: 0.0 standard drinks of alcohol   Drug use: No   Sexual activity: Not on file  Other Topics Concern   Not on file  Social History Narrative   Not on file   Social Determinants of Health   Financial Resource Strain: Not on file  Food Insecurity: Not on file  Transportation Needs: Not on file  Physical Activity: Not on file  Stress: Not on file  Social Connections: Not on file  Intimate Partner Violence: Not on file     Physical Exam: BP 96/68    Pulse (!) 105   Ht '5\' 8"'$  (1.727 m)   Wt 193 lb (87.5 kg)   LMP 06/09/2017 (Exact Date)   BMI 29.35 kg/m  Constitutional: generally well-appearing Psychiatric: alert and oriented x3 Abdomen: soft, nontender, nondistended, no obvious ascites, no peritoneal signs, normal bowel sounds No peripheral edema noted in lower extremities  Assessment and plan: 56 y.o. female with GI bleeding from [place] erosions, large hiatal hernia  I first saw these classic Lysbeth Galas ulcerations about 2 months ago.  Miscommunication led to her not taking proton pump inhibitor and instead only taking Carafate 4 times daily.  I think this led to further bleeding and a second admission.  The ulcerations were confirmed again by endoscopy and she was also found to have ischemic colitis after anaphylaxis from IV iron infusion.  My hope is that we are able to control  her bleeding, her anemia due to her Lysbeth Galas ulcerations without requiring surgery.  She started twice daily proton pump inhibitors last week and she will continue those indefinitely.  I do not think Carafate is necessary here and so she will stop that.  She will take an iron over-the-counter pill 1 pill once daily for now.  She gets labs checked about every month through oncology and she will alert me to her next lab draw so that I may see how her hemoglobin looks.  She will return to see me in 3 months.  She is going to cancel her surgery appointment next month, I am hoping to control her bleeding symptoms medically but she understands that if we are not then indeed she might need hiatal hernia repair.   Please see the "Patient Instructions" section for addition details about the plan.  Owens Loffler, MD [facility] 10/26/2021, 2:57 PM   Total time on date of encounter was 30 minutes (this included time spent preparing to see the patient reviewing records; obtaining and/or reviewing separately obtained history; performing a medically appropriate  exam and/or evaluation; counseling and educating the patient and family if present; ordering medications, tests or procedures if applicable; and documenting clinical information in the health record).

## 2021-10-26 NOTE — Patient Instructions (Addendum)
If you are age 58 or younger, your body mass index should be between 19-25. Your Body mass index is 29.35 kg/m. If this is out of the aformentioned range listed, please consider follow up with your Primary Care Provider.  ________________________________________________________  The [place] [facility] providers would like to encourage you to use [facility] to communicate with providers for non-urgent requests or questions.  Due to long hold times on the telephone, sending your provider a message by [facility] may be a faster and more efficient way to get a response.  Please allow 48 business hours for a response.  Please remember that this is for non-urgent requests.  _______________________________________________________  DISCONTINUE: Carafate  CONTINUE: Protonix twice daily  Please purchase the following medications over the counter and take as directed:  START: iron daily.  You will need a follow up in 3 months (September 2023).  We will contact you to get this appointment scheduled.  Please send a MyChart message or call our office to make [place] aware once you have lab work drawn at Oncology.  We will be able to see results in Epic.  Thank you for entrusting me with your care and choosing [facility].  Dr Ardis Hughs

## 2021-11-05 ENCOUNTER — Encounter: Payer: Self-pay | Admitting: Hematology & Oncology

## 2021-11-05 ENCOUNTER — Inpatient Hospital Stay: Payer: BC Managed Care – PPO

## 2021-11-05 ENCOUNTER — Inpatient Hospital Stay (HOSPITAL_BASED_OUTPATIENT_CLINIC_OR_DEPARTMENT_OTHER): Payer: BC Managed Care – PPO | Admitting: Hematology & Oncology

## 2021-11-05 ENCOUNTER — Telehealth: Payer: Self-pay | Admitting: *Deleted

## 2021-11-05 ENCOUNTER — Telehealth: Payer: Self-pay

## 2021-11-05 ENCOUNTER — Other Ambulatory Visit: Payer: Self-pay

## 2021-11-05 VITALS — BP 109/81 | HR 86 | Temp 97.8°F | Resp 18 | Ht 68.0 in | Wt 198.0 lb

## 2021-11-05 DIAGNOSIS — Z79899 Other long term (current) drug therapy: Secondary | ICD-10-CM | POA: Diagnosis not present

## 2021-11-05 DIAGNOSIS — C50919 Malignant neoplasm of unspecified site of unspecified female breast: Secondary | ICD-10-CM | POA: Diagnosis not present

## 2021-11-05 DIAGNOSIS — D509 Iron deficiency anemia, unspecified: Secondary | ICD-10-CM | POA: Diagnosis not present

## 2021-11-05 DIAGNOSIS — C7951 Secondary malignant neoplasm of bone: Secondary | ICD-10-CM | POA: Diagnosis not present

## 2021-11-05 DIAGNOSIS — K449 Diaphragmatic hernia without obstruction or gangrene: Secondary | ICD-10-CM | POA: Diagnosis not present

## 2021-11-05 DIAGNOSIS — D500 Iron deficiency anemia secondary to blood loss (chronic): Secondary | ICD-10-CM

## 2021-11-05 DIAGNOSIS — K2901 Acute gastritis with bleeding: Secondary | ICD-10-CM | POA: Diagnosis not present

## 2021-11-05 DIAGNOSIS — Z170 Estrogen receptor positive status [ER+]: Secondary | ICD-10-CM | POA: Diagnosis not present

## 2021-11-05 DIAGNOSIS — Z79811 Long term (current) use of aromatase inhibitors: Secondary | ICD-10-CM | POA: Diagnosis not present

## 2021-11-05 LAB — CBC WITH DIFFERENTIAL (CANCER CENTER ONLY)
Abs Immature Granulocytes: 0.05 10*3/uL (ref 0.00–0.07)
Basophils Absolute: 0.1 10*3/uL (ref 0.0–0.1)
Basophils Relative: 1 %
Eosinophils Absolute: 0.1 10*3/uL (ref 0.0–0.5)
Eosinophils Relative: 3 %
HCT: 33.7 % — ABNORMAL LOW (ref 36.0–46.0)
Hemoglobin: 11 g/dL — ABNORMAL LOW (ref 12.0–15.0)
Immature Granulocytes: 1 %
Lymphocytes Relative: 22 %
Lymphs Abs: 0.8 10*3/uL (ref 0.7–4.0)
MCH: 32.7 pg (ref 26.0–34.0)
MCHC: 32.6 g/dL (ref 30.0–36.0)
MCV: 100.3 fL — ABNORMAL HIGH (ref 80.0–100.0)
Monocytes Absolute: 0.3 10*3/uL (ref 0.1–1.0)
Monocytes Relative: 7 %
Neutro Abs: 2.5 10*3/uL (ref 1.7–7.7)
Neutrophils Relative %: 66 %
Platelet Count: 254 10*3/uL (ref 150–400)
RBC: 3.36 MIL/uL — ABNORMAL LOW (ref 3.87–5.11)
RDW: 17.2 % — ABNORMAL HIGH (ref 11.5–15.5)
Smear Review: NORMAL
WBC Count: 3.7 10*3/uL — ABNORMAL LOW (ref 4.0–10.5)
nRBC: 0 % (ref 0.0–0.2)

## 2021-11-05 LAB — RETICULOCYTES
Immature Retic Fract: 20.1 % — ABNORMAL HIGH (ref 2.3–15.9)
RBC.: 3.3 MIL/uL — ABNORMAL LOW (ref 3.87–5.11)
Retic Count, Absolute: 64 10*3/uL (ref 19.0–186.0)
Retic Ct Pct: 1.9 % (ref 0.4–3.1)

## 2021-11-05 LAB — CMP (CANCER CENTER ONLY)
ALT: 10 U/L (ref 0–44)
AST: 11 U/L — ABNORMAL LOW (ref 15–41)
Albumin: 4.1 g/dL (ref 3.5–5.0)
Alkaline Phosphatase: 47 U/L (ref 38–126)
Anion gap: 6 (ref 5–15)
BUN: 19 mg/dL (ref 6–20)
CO2: 28 mmol/L (ref 22–32)
Calcium: 9.2 mg/dL (ref 8.9–10.3)
Chloride: 106 mmol/L (ref 98–111)
Creatinine: 1.07 mg/dL — ABNORMAL HIGH (ref 0.44–1.00)
GFR, Estimated: >60 mL/min (ref >60)
Glucose, Bld: 99 mg/dL (ref 70–99)
Potassium: 4.2 mmol/L (ref 3.5–5.1)
Sodium: 140 mmol/L (ref 135–145)
Total Bilirubin: 0.4 mg/dL (ref 0.3–1.2)
Total Protein: 6.5 g/dL (ref 6.5–8.1)

## 2021-11-05 LAB — IRON AND IRON BINDING CAPACITY (CC-WL,HP ONLY)
Iron: 89 ug/dL (ref 28–170)
Saturation Ratios: 28 % (ref 10.4–31.8)
TIBC: 315 ug/dL (ref 250–450)
UIBC: 226 ug/dL (ref 148–442)

## 2021-11-05 LAB — FERRITIN: Ferritin: 27 ng/mL (ref 11–307)

## 2021-11-05 MED ORDER — DENOSUMAB 120 MG/1.7ML ~~LOC~~ SOLN
120.0000 mg | Freq: Once | SUBCUTANEOUS | Status: AC
  Administered 2021-11-05: 120 mg via SUBCUTANEOUS
  Filled 2021-11-05: qty 1.7

## 2021-11-05 MED ORDER — FULVESTRANT 250 MG/5ML IM SOSY
500.0000 mg | PREFILLED_SYRINGE | INTRAMUSCULAR | Status: DC
  Administered 2021-11-05: 500 mg via INTRAMUSCULAR
  Filled 2021-11-05: qty 10

## 2021-11-05 NOTE — Progress Notes (Addendum)
Hematology and Oncology Follow Up Visit  Caitlin Greene [id number] [birth year] 56 y.o. 11/05/2021   Principle Diagnosis:  Metastatic breast cancer-ER positive/HER-2 negative --bone metastasis only Iron deficiency anemia  Current Therapy:   Faslodex 500 mg IM monthly --start on 04/2020 Ibrance 100 mg p.o. daily (21d on/7d off) - start on 04/2020 Xgeva 120 mg subcu every 3 months -next dose 09//2023 IV iron-Feraheme given on 09/14/2021     Interim History:  Caitlin Greene is in for follow-up.  Unfortunately, she was back into the hospital after I saw her last time.  She had noted a bout of GI bleeding.  Gastroenterology did not feel that they needed to do endoscopy on her.  We gave her some IV iron in the hospital.  She had a reaction to the IV iron.  She actually looks quite good.  She feels good.  There is no problems with melena or bright red blood per rectum.  She has had no cough or shortness of breath.  She has had no bony pain.  She is on oxycodone which do seem to help.  S  Her last CA 27.29 was a little bit higher at 44.  Her last scans were done back in March.  We probably are due for some more scans on her in July.  Her appetite has been okay.  There is been no cough.  She has had no shortness of breath.  She does feel little tired.  I just wonder if she may have a little bit of iron deficiency.  I did give her some iron samples.  Hopefully, these will help her.  Currently, I would have to say that her performance status is probably ECOG 1.   Medications:  Current Outpatient Medications:    acetaminophen (TYLENOL) 650 MG CR tablet, Take 650 mg by mouth every 8 (eight) hours as needed for pain., Disp: , Rfl:    calcium carbonate (OS-CAL) 600 MG tablet, Take 1 tablet (600 mg total) by mouth 2 (two) times daily., Disp: 30 tablet, Rfl: 0   Ferrous Sulfate (IRON) 325 (65 Fe) MG TABS, Take 1 tablet (325 mg total) by mouth daily at 2 PM., Disp: 30 tablet, Rfl: 0   FLUoxetine (PROZAC)  20 MG capsule, TAKE 3 CAPSULES(60 MG) BY MOUTH DAILY (Patient taking differently: 60 mg daily.), Disp: 270 capsule, Rfl: 3   folic acid (FOLVITE) 1 MG tablet, Take 2 tablets (2 mg total) by mouth daily., Disp: 30 tablet, Rfl: 0   fulvestrant (FASLODEX) 250 MG/5ML injection, Inject 500 mg into the muscle every 30 (thirty) days. One injection each buttock over 1-2 minutes. Warm prior to use., Disp: , Rfl:    hydrOXYzine (ATARAX/VISTARIL) 25 MG tablet, Take 25 mg by mouth 3 (three) times daily as needed for anxiety., Disp: , Rfl:    IBRANCE 100 MG tablet, Take 100 mg by mouth See admin instructions. 137m daily on day 1-21 of cycle. 7 days off.  Repeat., Disp: , Rfl:    ondansetron (ZOFRAN) 4 MG tablet, Take 1 tablet (4 mg total) by mouth every 4 (four) hours as needed. (Patient taking differently: Take 4 mg by mouth every 4 (four) hours as needed for nausea.), Disp: 20 tablet, Rfl: 0   oxyCODONE (OXY IR/ROXICODONE) 5 MG immediate release tablet, Take 1 tablet (5 mg total) by mouth every 8 (eight) hours as needed for severe pain., Disp: 50 tablet, Rfl: 0   pantoprazole (PROTONIX) 40 MG tablet, Take 1 tablet (40 mg total) by  mouth 2 (two) times daily., Disp: 60 tablet, Rfl: 2   polyethylene glycol (MIRALAX / GLYCOLAX) 17 g packet, Take 17 g by mouth daily as needed. (Patient taking differently: Take 17 g by mouth daily as needed for moderate constipation.), Disp: 28 each, Rfl: 0   vitamin B-12 (CYANOCOBALAMIN) 1000 MCG tablet, Take 1 tablet (1,000 mcg total) by mouth daily., Disp: 30 tablet, Rfl: 0 No current facility-administered medications for this visit.  Facility-Administered Medications Ordered in Other Visits:    denosumab (XGEVA) injection 120 mg, 120 mg, Subcutaneous, Once, Caitlin Greene, Caitlin Cobb, MD   fulvestrant (FASLODEX) injection 500 mg, 500 mg, Intramuscular, Q30 days, Caitlin Greene, Caitlin Cobb, MD  Allergies:  Allergies  Allergen Reactions   Amoxicillin-Pot Clavulanate Hives and Itching   Iron  Anaphylaxis and Other (See Comments)    Patient develops hypotension, resp distress after infusion of ferra heme.    Penicillins Hives, Itching and Other (See Comments)   Codeine Nausea And Vomiting    Needs pre-meds     Past Medical History, Surgical history, Social history, and Family History were reviewed and updated.  Review of Systems: Review of Systems  Constitutional:  Positive for appetite change.  HENT:  Negative.    Eyes: Negative.   Respiratory: Negative.    Cardiovascular: Negative.   Gastrointestinal:  Positive for abdominal pain and nausea.  Endocrine: Negative.   Genitourinary: Negative.    Musculoskeletal:  Positive for arthralgias and back pain.  Skin: Negative.   Neurological:  Positive for dizziness.  Hematological: Negative.   Psychiatric/Behavioral: Negative.      Physical Exam:  height is 5' 8"  (1.727 m) and weight is 198 lb (89.8 kg). Her oral temperature is 97.8 F (36.6 C). Her blood pressure is 109/81 and her pulse is 86. Her respiration is 18 and oxygen saturation is 98%.   Wt Readings from Last 3 Encounters:  11/05/21 198 lb (89.8 kg)  10/26/21 193 lb (87.5 kg)  10/19/21 197 lb 1.5 oz (89.4 kg)    Physical Exam Vitals reviewed.  HENT:     Head: Normocephalic and atraumatic.  Eyes:     Pupils: Pupils are equal, round, and reactive to light.  Cardiovascular:     Rate and Rhythm: Normal rate and regular rhythm.     Heart sounds: Normal heart sounds.  Pulmonary:     Effort: Pulmonary effort is normal.     Breath sounds: Normal breath sounds.  Abdominal:     General: Bowel sounds are normal.     Palpations: Abdomen is soft.  Musculoskeletal:        General: No tenderness or deformity. Normal range of motion.     Cervical back: Normal range of motion.  Lymphadenopathy:     Cervical: No cervical adenopathy.  Skin:    General: Skin is warm and dry.     Findings: No erythema or rash.  Neurological:     Mental Status: She is alert and  oriented to person, place, and time.  Psychiatric:        Behavior: Behavior normal.        Thought Content: Thought content normal.        Judgment: Judgment normal.      Lab Results  Component Value Date   WBC 3.7 (L) 11/05/2021   HGB 11.0 (L) 11/05/2021   HCT 33.7 (L) 11/05/2021   MCV 100.3 (H) 11/05/2021   PLT 254 11/05/2021     Chemistry      Component Value  Date/Time   NA 140 11/05/2021 0944   K 4.2 11/05/2021 0944   CL 106 11/05/2021 0944   CO2 28 11/05/2021 0944   BUN 19 11/05/2021 0944   CREATININE 1.07 (H) 11/05/2021 0944   CREATININE 0.88 10/22/2019 1246      Component Value Date/Time   CALCIUM 9.2 11/05/2021 0944   ALKPHOS 47 11/05/2021 0944   AST 11 (L) 11/05/2021 0944   ALT 10 11/05/2021 0944   BILITOT 0.4 11/05/2021 0944      Impression and Plan: Ms. Pasley is a very charming 56 year old postmenopausal female with metastatic breast cancer.  We actually had seen her many years ago with a carcinoma in situ.  She is the wife of one of our former patients who passed away from ocular melanoma.  Hopefully, there will be no issues with respect to the GI bleeding again.  We will have to get her set up with some scans.  CT scans and bone scans usually are what we use.  I do not think we have to do any MRIs.  I do not find any neurological issues that we have to worry about.  We will plan to get the CT scans and bone scans in 3 weeks.  She will get her Faslodex today.  We will also give her the Xgeva.    [name], MD 6/30/202310:19 AM

## 2021-11-05 NOTE — Telephone Encounter (Signed)
-----   Message from [name], MD sent at 11/05/2021  3:01 PM EDT ----- Call let her know that the iron level is actually not bad.  Caitlin Greene

## 2021-11-05 NOTE — Telephone Encounter (Signed)
-----   Message from [name], MD sent at 11/05/2021  3:01 PM EDT ----- Call let her know that the iron level is actually not bad.  [name]

## 2021-11-05 NOTE — Telephone Encounter (Signed)
Per 11/05/21 los - gave upcoming appointments - confirmed 

## 2021-11-05 NOTE — Patient Instructions (Signed)
[facility]  Discharge Instructions: Thank you for choosing [facility] to provide your oncology and hematology care.   If you have a lab appointment with the [facility], please go directly to the [facility] and check in at the registration area.  Wear comfortable clothing and clothing appropriate for easy access to any Portacath or PICC line.   We strive to give you quality time with your provider. You may need to reschedule your appointment if you arrive late (15 or more minutes).  Arriving late affects you and other patients whose appointments are after yours.  Also, if you miss three or more appointments without notifying the office, you may be dismissed from the clinic at the provider's discretion.      For prescription refill requests, have your pharmacy contact our office and allow 72 hours for refills to be completed.    Today you received the following chemotherapy and/or immunotherapy agents Faslodex, Xgeva      To help prevent nausea and vomiting after your treatment, we encourage you to take your nausea medication as directed.  BELOW ARE SYMPTOMS THAT SHOULD BE REPORTED IMMEDIATELY: *FEVER GREATER THAN 100.4 F (38 C) OR HIGHER *CHILLS OR SWEATING *NAUSEA AND VOMITING THAT IS NOT CONTROLLED WITH YOUR NAUSEA MEDICATION *UNUSUAL SHORTNESS OF BREATH *UNUSUAL BRUISING OR BLEEDING *URINARY PROBLEMS (pain or burning when urinating, or frequent urination) *BOWEL PROBLEMS (unusual diarrhea, constipation, pain near the anus) TENDERNESS IN MOUTH AND THROAT WITH OR WITHOUT PRESENCE OF ULCERS (sore throat, sores in mouth, or a toothache) UNUSUAL RASH, SWELLING OR PAIN  UNUSUAL VAGINAL DISCHARGE OR ITCHING   Items with * indicate a potential emergency and should be followed up as soon as possible or go to the Emergency Department if any problems should occur.  Please show the CHEMOTHERAPY ALERT CARD or IMMUNOTHERAPY ALERT CARD at check-in to  the Emergency Department and triage nurse. Should you have questions after your visit or need to cancel or reschedule your appointment, please contact [facility]  [phone] and follow the prompts.  Office hours are 8:00 a.m. to 4:30 p.m. Monday - Friday. Please note that voicemails left after 4:00 p.m. may not be returned until the following business day.  We are closed weekends and major holidays. You have access to a nurse at all times for urgent questions. Please call the main number to the clinic [phone] and follow the prompts.  For any non-urgent questions, you may also contact your provider using MyChart. We now offer e-Visits for anyone 69 and older to request care online for non-urgent symptoms. For details visit [email or web address].   Also download the MyChart app! Go to the app store, search "MyChart", open the app, select [facility], and log in with your MyChart username and password.  Masks are optional in the cancer centers. If you would like for your care team to wear a mask while they are taking care of you, please let them know. For doctor visits, patients may have with them one support person who is at least 58 years old. At this time, visitors are not allowed in the infusion area.

## 2021-11-06 LAB — CANCER ANTIGEN 27.29: CA 27.29: 46.8 U/mL — ABNORMAL HIGH (ref 0.0–38.6)

## 2021-11-08 ENCOUNTER — Telehealth: Payer: Self-pay

## 2021-11-08 NOTE — Telephone Encounter (Signed)
-----   Message from [name], MD sent at 11/06/2021 10:06 AM EDT ----- Call - the tumor marker is holding steady at 47.  Caitlin Greene

## 2021-12-03 ENCOUNTER — Encounter (HOSPITAL_COMMUNITY)
Admission: RE | Admit: 2021-12-03 | Discharge: 2021-12-03 | Disposition: A | Discharge status: Home / Self Care | Payer: BC Managed Care – PPO | Source: Ambulatory Visit | Attending: Hematology & Oncology | Admitting: Hematology & Oncology

## 2021-12-03 ENCOUNTER — Encounter: Payer: Self-pay | Admitting: *Deleted

## 2021-12-03 DIAGNOSIS — K449 Diaphragmatic hernia without obstruction or gangrene: Secondary | ICD-10-CM | POA: Diagnosis not present

## 2021-12-03 DIAGNOSIS — D171 Benign lipomatous neoplasm of skin and subcutaneous tissue of trunk: Secondary | ICD-10-CM | POA: Diagnosis not present

## 2021-12-03 DIAGNOSIS — K5730 Diverticulosis of large intestine without perforation or abscess without bleeding: Secondary | ICD-10-CM | POA: Diagnosis not present

## 2021-12-03 DIAGNOSIS — C7951 Secondary malignant neoplasm of bone: Secondary | ICD-10-CM | POA: Diagnosis not present

## 2021-12-03 DIAGNOSIS — K4020 Bilateral inguinal hernia, without obstruction or gangrene, not specified as recurrent: Secondary | ICD-10-CM | POA: Diagnosis not present

## 2021-12-03 DIAGNOSIS — C50919 Malignant neoplasm of unspecified site of unspecified female breast: Secondary | ICD-10-CM | POA: Insufficient documentation

## 2021-12-03 DIAGNOSIS — K8020 Calculus of gallbladder without cholecystitis without obstruction: Secondary | ICD-10-CM | POA: Diagnosis not present

## 2021-12-03 DIAGNOSIS — D3502 Benign neoplasm of left adrenal gland: Secondary | ICD-10-CM | POA: Diagnosis not present

## 2021-12-03 MED ORDER — TECHNETIUM TC 99M MEDRONATE IV KIT
20.0000 | PACK | Freq: Once | INTRAVENOUS | Status: AC | PRN
  Administered 2021-12-03: 19 via INTRAVENOUS

## 2021-12-03 MED ORDER — SODIUM CHLORIDE (PF) 0.9 % IJ SOLN
INTRAMUSCULAR | Status: AC
  Filled 2021-12-03: qty 50

## 2021-12-03 MED ORDER — IOHEXOL 300 MG/ML  SOLN
100.0000 mL | Freq: Once | INTRAMUSCULAR | Status: AC | PRN
  Administered 2021-12-03: 100 mL via INTRAVENOUS

## 2021-12-06 ENCOUNTER — Other Ambulatory Visit: Payer: Self-pay

## 2021-12-06 ENCOUNTER — Inpatient Hospital Stay: Payer: BC Managed Care – PPO | Attending: Hematology & Oncology

## 2021-12-06 ENCOUNTER — Inpatient Hospital Stay: Payer: BC Managed Care – PPO | Admitting: Hematology & Oncology

## 2021-12-06 ENCOUNTER — Inpatient Hospital Stay: Payer: BC Managed Care – PPO

## 2021-12-06 ENCOUNTER — Encounter: Payer: Self-pay | Admitting: Hematology & Oncology

## 2021-12-06 VITALS — BP 114/78 | HR 80 | Temp 97.5°F | Resp 18 | Ht 68.0 in | Wt 198.0 lb

## 2021-12-06 DIAGNOSIS — C50919 Malignant neoplasm of unspecified site of unspecified female breast: Secondary | ICD-10-CM | POA: Diagnosis not present

## 2021-12-06 DIAGNOSIS — Z170 Estrogen receptor positive status [ER+]: Secondary | ICD-10-CM | POA: Insufficient documentation

## 2021-12-06 DIAGNOSIS — D509 Iron deficiency anemia, unspecified: Secondary | ICD-10-CM | POA: Insufficient documentation

## 2021-12-06 DIAGNOSIS — K2901 Acute gastritis with bleeding: Secondary | ICD-10-CM

## 2021-12-06 DIAGNOSIS — Z79899 Other long term (current) drug therapy: Secondary | ICD-10-CM | POA: Insufficient documentation

## 2021-12-06 DIAGNOSIS — C7951 Secondary malignant neoplasm of bone: Secondary | ICD-10-CM | POA: Insufficient documentation

## 2021-12-06 DIAGNOSIS — Z5111 Encounter for antineoplastic chemotherapy: Secondary | ICD-10-CM | POA: Diagnosis not present

## 2021-12-06 LAB — CBC WITH DIFFERENTIAL (CANCER CENTER ONLY)
Abs Immature Granulocytes: 0.01 10*3/uL (ref 0.00–0.07)
Basophils Absolute: 0.1 10*3/uL (ref 0.0–0.1)
Basophils Relative: 2 %
Eosinophils Absolute: 0 10*3/uL (ref 0.0–0.5)
Eosinophils Relative: 1 %
HCT: 32.3 % — ABNORMAL LOW (ref 36.0–46.0)
Hemoglobin: 11.2 g/dL — ABNORMAL LOW (ref 12.0–15.0)
Immature Granulocytes: 0 %
Lymphocytes Relative: 31 %
Lymphs Abs: 0.9 10*3/uL (ref 0.7–4.0)
MCH: 34.4 pg — ABNORMAL HIGH (ref 26.0–34.0)
MCHC: 34.7 g/dL (ref 30.0–36.0)
MCV: 99.1 fL (ref 80.0–100.0)
Monocytes Absolute: 0.4 10*3/uL (ref 0.1–1.0)
Monocytes Relative: 15 %
Neutro Abs: 1.4 10*3/uL — ABNORMAL LOW (ref 1.7–7.7)
Neutrophils Relative %: 51 %
Platelet Count: 216 10*3/uL (ref 150–400)
RBC: 3.26 MIL/uL — ABNORMAL LOW (ref 3.87–5.11)
RDW: 17.9 % — ABNORMAL HIGH (ref 11.5–15.5)
Smear Review: NORMAL
WBC Count: 2.9 10*3/uL — ABNORMAL LOW (ref 4.0–10.5)
nRBC: 0 % (ref 0.0–0.2)

## 2021-12-06 LAB — CMP (CANCER CENTER ONLY)
ALT: 13 U/L (ref 0–44)
AST: 16 U/L (ref 15–41)
Albumin: 4.2 g/dL (ref 3.5–5.0)
Alkaline Phosphatase: 45 U/L (ref 38–126)
Anion gap: 8 (ref 5–15)
BUN: 13 mg/dL (ref 6–20)
CO2: 24 mmol/L (ref 22–32)
Calcium: 9.3 mg/dL (ref 8.9–10.3)
Chloride: 107 mmol/L (ref 98–111)
Creatinine: 0.86 mg/dL (ref 0.44–1.00)
GFR, Estimated: >60 mL/min (ref >60)
Glucose, Bld: 97 mg/dL (ref 70–99)
Potassium: 3.7 mmol/L (ref 3.5–5.1)
Sodium: 139 mmol/L (ref 135–145)
Total Bilirubin: 0.5 mg/dL (ref 0.3–1.2)
Total Protein: 6.5 g/dL (ref 6.5–8.1)

## 2021-12-06 LAB — FERRITIN: Ferritin: 13 ng/mL (ref 11–307)

## 2021-12-06 LAB — IRON AND IRON BINDING CAPACITY (CC-WL,HP ONLY)
Iron: 70 ug/dL (ref 28–170)
Saturation Ratios: 20 % (ref 10.4–31.8)
TIBC: 356 ug/dL (ref 250–450)
UIBC: 286 ug/dL (ref 148–442)

## 2021-12-06 LAB — RETICULOCYTES
Immature Retic Fract: 24.9 % — ABNORMAL HIGH (ref 2.3–15.9)
RBC.: 3.28 MIL/uL — ABNORMAL LOW (ref 3.87–5.11)
Retic Count, Absolute: 76.8 10*3/uL (ref 19.0–186.0)
Retic Ct Pct: 2.3 % (ref 0.4–3.1)

## 2021-12-06 MED ORDER — FULVESTRANT 250 MG/5ML IM SOSY
500.0000 mg | PREFILLED_SYRINGE | INTRAMUSCULAR | Status: DC
  Administered 2021-12-06: 500 mg via INTRAMUSCULAR
  Filled 2021-12-06: qty 10

## 2021-12-06 NOTE — Patient Instructions (Signed)
Fulvestrant injection What is this medication? FULVESTRANT (ful VES trant) blocks the effects of estrogen. It is used to treat breast cancer. This medicine may be used for other purposes; ask your health care provider or pharmacist if you have questions. COMMON BRAND NAME(S): FASLODEX What should I tell my care team before I take this medication? They need to know if you have any of these conditions: bleeding disorders liver disease low blood counts, like low white cell, platelet, or red cell counts an unusual or allergic reaction to fulvestrant, other medicines, foods, dyes, or preservatives pregnant or trying to get pregnant breast-feeding How should I use this medication? This medicine is for injection into a muscle. It is usually given by a health care professional in a hospital or clinic setting. Talk to your pediatrician regarding the use of this medicine in children. Special care may be needed. Overdosage: If you think you have taken too much of this medicine contact a poison control center or emergency room at once. NOTE: This medicine is only for you. Do not share this medicine with others. What if I miss a dose? It is important not to miss your dose. Call your doctor or health care professional if you are unable to keep an appointment. What may interact with this medication? medicines that treat or prevent blood clots like warfarin, enoxaparin, dalteparin, apixaban, dabigatran, and rivaroxaban This list may not describe all possible interactions. Give your health care provider a list of all the medicines, herbs, non-prescription drugs, or dietary supplements you use. Also tell them if you smoke, drink alcohol, or use illegal drugs. Some items may interact with your medicine. What should I watch for while using this medication? Your condition will be monitored carefully while you are receiving this medicine. You will need important blood work done while you are taking this  medicine. Do not become pregnant while taking this medicine or for at least 1 year after stopping it. Women of child-bearing potential will need to have a negative pregnancy test before starting this medicine. Women should inform their doctor if they wish to become pregnant or think they might be pregnant. There is a potential for serious side effects to an unborn child. Men should inform their doctors if they wish to father a child. This medicine may lower sperm counts. Talk to your health care professional or pharmacist for more information. Do not breast-feed an infant while taking this medicine or for 1 year after the last dose. What side effects may I notice from receiving this medication? Side effects that you should report to your doctor or health care professional as soon as possible: allergic reactions like skin rash, itching or hives, swelling of the face, lips, or tongue feeling faint or lightheaded, falls pain, tingling, numbness, or weakness in the legs signs and symptoms of infection like fever or chills; cough; flu-like symptoms; sore throat vaginal bleeding Side effects that usually do not require medical attention (report to your doctor or health care professional if they continue or are bothersome): aches, pains constipation diarrhea headache hot flashes nausea, vomiting pain at site where injected stomach pain This list may not describe all possible side effects. Call your doctor for medical advice about side effects. You may report side effects to FDA at 1-800-FDA-1088. Where should I keep my medication? This drug is given in a hospital or clinic and will not be stored at home. NOTE: This sheet is a summary. It may not cover all possible information. If you have   questions about this medicine, talk to your doctor, pharmacist, or health care provider.  2023 Elsevier/Gold Standard (2017-08-08 00:00:00)  

## 2021-12-06 NOTE — Progress Notes (Signed)
Hematology and Oncology Follow Up Visit  Caitlin Greene [id number] 11/13/1965 56 y.o. 12/06/2021   Principle Diagnosis:  Metastatic breast cancer-ER positive/HER-2 negative --bone metastasis only Iron deficiency anemia  Current Therapy:   Faslodex 500 mg IM monthly --start on 04/2020 Ibrance 100 mg p.o. daily (21d on/7d off) - start on 04/2020 Xgeva 120 mg subcu every 3 months -next dose 09//2023 IV iron-Feraheme given on 09/14/2021     Interim History:  Caitlin Greene is in for follow-up.  She is doing pretty well.  She had a wonderful weekend.  She comes in very nice and tan.  She was at her pool.  We did go ahead and do scans on her.  This was done a couple weeks ago.  Thankfully, both the CT scan and the bone scan did not show any evidence of progressive disease.  She has stable sclerotic metastasis.  Her last CA 27.29 was a little bit more elevated at 47.  We are watching this closely.  Today, her white cell count is low but on the low side.  She is supposed start the [place] today.  I told her to hold the Ibrance for a week to try to allow her white cell count to get a little bit higher.  She has had no problems with cough.  There is no nausea or vomiting.  She does have a hiatal hernia.  She has had no bleeding from this.  There is been no change in bowel or bladder habits.  She has had no leg swelling.  She has had no rashes.  Overall, I would say performance status is probably ECOG 1.   Medications:  Current Outpatient Medications:    acetaminophen (TYLENOL) 650 MG CR tablet, Take 650 mg by mouth every 8 (eight) hours as needed for pain., Disp: , Rfl:    calcium carbonate (OS-CAL) 600 MG tablet, Take 1 tablet (600 mg total) by mouth 2 (two) times daily., Disp: 30 tablet, Rfl: 0   Ferrous Sulfate (IRON) 325 (65 Fe) MG TABS, Take 1 tablet (325 mg total) by mouth daily at 2 PM., Disp: 30 tablet, Rfl: 0   FLUoxetine (PROZAC) 20 MG capsule, TAKE 3 CAPSULES(60 MG) BY MOUTH DAILY  (Patient taking differently: 60 mg daily.), Disp: 270 capsule, Rfl: 3   fulvestrant (FASLODEX) 250 MG/5ML injection, Inject 500 mg into the muscle every 30 (thirty) days. One injection each buttock over 1-2 minutes. Warm prior to use., Disp: , Rfl:    hydrOXYzine (ATARAX/VISTARIL) 25 MG tablet, Take 25 mg by mouth 3 (three) times daily as needed for anxiety., Disp: , Rfl:    IBRANCE 100 MG tablet, Take 100 mg by mouth See admin instructions. 153m daily on day 1-21 of cycle. 7 days off.  Repeat., Disp: , Rfl:    ondansetron (ZOFRAN) 4 MG tablet, Take 1 tablet (4 mg total) by mouth every 4 (four) hours as needed. (Patient taking differently: Take 4 mg by mouth every 4 (four) hours as needed for nausea.), Disp: 20 tablet, Rfl: 0   oxyCODONE (OXY IR/ROXICODONE) 5 MG immediate release tablet, Take 1 tablet (5 mg total) by mouth every 8 (eight) hours as needed for severe pain., Disp: 50 tablet, Rfl: 0   pantoprazole (PROTONIX) 40 MG tablet, Take 1 tablet (40 mg total) by mouth 2 (two) times daily., Disp: 60 tablet, Rfl: 2   polyethylene glycol (MIRALAX / GLYCOLAX) 17 g packet, Take 17 g by mouth daily as needed. (Patient taking differently: Take 17 g  by mouth daily as needed for moderate constipation.), Disp: 28 each, Rfl: 0   vitamin B-12 (CYANOCOBALAMIN) 1000 MCG tablet, Take 1 tablet (1,000 mcg total) by mouth daily., Disp: 30 tablet, Rfl: 0 No current facility-administered medications for this visit.  Facility-Administered Medications Ordered in Other Visits:    fulvestrant (FASLODEX) injection 500 mg, 500 mg, Intramuscular, Q30 days, Sheva Mcdougle, Rudell Cobb, MD  Allergies:  Allergies  Allergen Reactions   Amoxicillin-Pot Clavulanate Hives and Itching   Iron Anaphylaxis and Other (See Comments)    Patient develops hypotension, resp distress after infusion of feraheme.    Penicillins Hives, Itching and Other (See Comments)   Codeine Nausea And Vomiting    Needs pre-meds     Past Medical History,  Surgical history, Social history, and Family History were reviewed and updated.  Review of Systems: Review of Systems  Constitutional:  Positive for appetite change.  HENT:  Negative.    Eyes: Negative.   Respiratory: Negative.    Cardiovascular: Negative.   Gastrointestinal:  Positive for abdominal pain and nausea.  Endocrine: Negative.   Genitourinary: Negative.    Musculoskeletal:  Positive for arthralgias and back pain.  Skin: Negative.   Neurological:  Positive for dizziness.  Hematological: Negative.   Psychiatric/Behavioral: Negative.      Physical Exam:  height is _0  (1.727 m) and weight is 198 lb (89.8 kg). Her oral temperature is 97.5 F (36.4 C) (abnormal). Her blood pressure is 114/78 and her pulse is 80. Her respiration is 18 and oxygen saturation is 100%.   Wt Readings from Last 3 Encounters:  12/06/21 198 lb (89.8 kg)  11/05/21 198 lb (89.8 kg)  10/26/21 193 lb (87.5 kg)    Physical Exam Vitals reviewed.  HENT:     Head: Normocephalic and atraumatic.  Eyes:     Pupils: Pupils are equal, round, and reactive to light.  Cardiovascular:     Rate and Rhythm: Normal rate and regular rhythm.     Heart sounds: Normal heart sounds.  Pulmonary:     Effort: Pulmonary effort is normal.     Breath sounds: Normal breath sounds.  Abdominal:     General: Bowel sounds are normal.     Palpations: Abdomen is soft.  Musculoskeletal:        General: No tenderness or deformity. Normal range of motion.     Cervical back: Normal range of motion.  Lymphadenopathy:     Cervical: No cervical adenopathy.  Skin:    General: Skin is warm and dry.     Findings: No erythema or rash.  Neurological:     Mental Status: She is alert and oriented to person, place, and time.  Psychiatric:        Behavior: Behavior normal.        Thought Content: Thought content normal.        Judgment: Judgment normal.      Lab Results  Component Value Date   WBC 2.9 (L) 12/06/2021   HGB  11.2 (L) 12/06/2021   HCT 32.3 (L) 12/06/2021   MCV 99.1 12/06/2021   PLT 216 12/06/2021     Chemistry      Component Value Date/Time   NA 139 12/06/2021 1317   K 3.7 12/06/2021 1317   CL 107 12/06/2021 1317   CO2 24 12/06/2021 1317   BUN 13 12/06/2021 1317   CREATININE 0.86 12/06/2021 1317   CREATININE 0.88 10/22/2019 1246      Component Value Date/Time  CALCIUM 9.3 12/06/2021 1317   ALKPHOS 45 12/06/2021 1317   AST 16 12/06/2021 1317   ALT 13 12/06/2021 1317   BILITOT 0.5 12/06/2021 1317      Impression and Plan: Ms. Garske is a very charming 56 year old postmenopausal female with metastatic breast cancer.  We actually had seen her many years ago with a carcinoma in situ.  She is the wife of one of our former patients who passed away from ocular melanoma.  Overall, the everything is going quite well.  I feel confident with respect to the CT scan and the bone scan.  Everything looks stable on these scans.  Again, we will go ahead and hold the Ibrance for 1 week.  She will get her Faslodex today.  When she comes back, it will be the end of August.  Her birthday is Labor Day weekend.  I am sure that she will enjoy this.    [name], MD 7/31/20232:22 PM

## 2021-12-07 LAB — CANCER ANTIGEN 27.29: CA 27.29: 56.7 U/mL — ABNORMAL HIGH (ref 0.0–38.6)

## 2021-12-22 ENCOUNTER — Inpatient Hospital Stay: Payer: BC Managed Care – PPO | Attending: Hematology & Oncology

## 2021-12-22 ENCOUNTER — Telehealth: Payer: Self-pay

## 2021-12-22 ENCOUNTER — Other Ambulatory Visit: Payer: Self-pay

## 2021-12-22 ENCOUNTER — Telehealth: Payer: Self-pay | Admitting: Gastroenterology

## 2021-12-22 DIAGNOSIS — C7951 Secondary malignant neoplasm of bone: Secondary | ICD-10-CM | POA: Diagnosis not present

## 2021-12-22 DIAGNOSIS — C50919 Malignant neoplasm of unspecified site of unspecified female breast: Secondary | ICD-10-CM | POA: Diagnosis not present

## 2021-12-22 DIAGNOSIS — Z170 Estrogen receptor positive status [ER+]: Secondary | ICD-10-CM | POA: Insufficient documentation

## 2021-12-22 DIAGNOSIS — D509 Iron deficiency anemia, unspecified: Secondary | ICD-10-CM | POA: Insufficient documentation

## 2021-12-22 DIAGNOSIS — K2901 Acute gastritis with bleeding: Secondary | ICD-10-CM

## 2021-12-22 DIAGNOSIS — Z5111 Encounter for antineoplastic chemotherapy: Secondary | ICD-10-CM | POA: Insufficient documentation

## 2021-12-22 LAB — CBC WITH DIFFERENTIAL (CANCER CENTER ONLY)
Abs Immature Granulocytes: 0.01 10*3/uL (ref 0.00–0.07)
Basophils Absolute: 0.1 10*3/uL (ref 0.0–0.1)
Basophils Relative: 2 %
Eosinophils Absolute: 0.1 10*3/uL (ref 0.0–0.5)
Eosinophils Relative: 2 %
HCT: 34.5 % — ABNORMAL LOW (ref 36.0–46.0)
Hemoglobin: 11.7 g/dL — ABNORMAL LOW (ref 12.0–15.0)
Immature Granulocytes: 0 %
Lymphocytes Relative: 30 %
Lymphs Abs: 1 10*3/uL (ref 0.7–4.0)
MCH: 33.9 pg (ref 26.0–34.0)
MCHC: 33.9 g/dL (ref 30.0–36.0)
MCV: 100 fL (ref 80.0–100.0)
Monocytes Absolute: 0.2 10*3/uL (ref 0.1–1.0)
Monocytes Relative: 6 %
Neutro Abs: 2 10*3/uL (ref 1.7–7.7)
Neutrophils Relative %: 60 %
Platelet Count: 258 10*3/uL (ref 150–400)
RBC: 3.45 MIL/uL — ABNORMAL LOW (ref 3.87–5.11)
RDW: 16.4 % — ABNORMAL HIGH (ref 11.5–15.5)
WBC Count: 3.4 10*3/uL — ABNORMAL LOW (ref 4.0–10.5)
nRBC: 0 % (ref 0.0–0.2)

## 2021-12-22 LAB — SAMPLE TO BLOOD BANK

## 2021-12-22 LAB — FERRITIN: Ferritin: 31 ng/mL (ref 11–307)

## 2021-12-22 NOTE — Telephone Encounter (Signed)
The pt has developed some rectal bleeding after starting chemo.  She says that she has not contacted Dr Marin Olp as of yet but was told that the chemo could cause some GI issues.  I have asked her to make Dr Marin Olp aware and if needed we can triage for appt.  The pt has been advised of the information and verbalized understanding.

## 2021-12-22 NOTE — Telephone Encounter (Signed)
Called and informed pt her HGB is 11.7, will follow back up with her once iron levels come back. Patient verbalized understanding and denies any other questions or concerns at this time.

## 2021-12-22 NOTE — Telephone Encounter (Signed)
Patient called stating she felt like her HGB  has dropped, states she called GI and they asked she call the cancer center to get her labs checked. Orders placed and lab appt made for today at 3pm. Pt aware

## 2021-12-22 NOTE — Telephone Encounter (Signed)
Patient called requesting to speak with a nurse regarding GI bleed from hernia seeking advise.

## 2021-12-23 ENCOUNTER — Telehealth: Payer: Self-pay

## 2021-12-23 LAB — IRON AND IRON BINDING CAPACITY (CC-WL,HP ONLY)
Iron: 173 ug/dL — ABNORMAL HIGH (ref 28–170)
Saturation Ratios: 46 % — ABNORMAL HIGH (ref 10.4–31.8)
TIBC: 375 ug/dL (ref 250–450)
UIBC: 202 ug/dL (ref 148–442)

## 2021-12-23 NOTE — Telephone Encounter (Signed)
-----   Message from [name], MD sent at 12/23/2021  1:17 PM EDT ----- Please call and let her know that the iron level is okay.

## 2022-01-04 ENCOUNTER — Encounter: Payer: Self-pay | Admitting: Hematology & Oncology

## 2022-01-04 ENCOUNTER — Inpatient Hospital Stay (HOSPITAL_BASED_OUTPATIENT_CLINIC_OR_DEPARTMENT_OTHER): Payer: BC Managed Care – PPO | Admitting: Hematology & Oncology

## 2022-01-04 ENCOUNTER — Inpatient Hospital Stay: Payer: BC Managed Care – PPO

## 2022-01-04 VITALS — BP 128/79 | HR 83 | Temp 97.8°F | Resp 18 | Ht 68.5 in | Wt 198.0 lb

## 2022-01-04 DIAGNOSIS — C50919 Malignant neoplasm of unspecified site of unspecified female breast: Secondary | ICD-10-CM

## 2022-01-04 DIAGNOSIS — D510 Vitamin B12 deficiency anemia due to intrinsic factor deficiency: Secondary | ICD-10-CM

## 2022-01-04 DIAGNOSIS — K921 Melena: Secondary | ICD-10-CM | POA: Diagnosis not present

## 2022-01-04 DIAGNOSIS — C7951 Secondary malignant neoplasm of bone: Secondary | ICD-10-CM

## 2022-01-04 DIAGNOSIS — Z5111 Encounter for antineoplastic chemotherapy: Secondary | ICD-10-CM | POA: Diagnosis not present

## 2022-01-04 DIAGNOSIS — Z170 Estrogen receptor positive status [ER+]: Secondary | ICD-10-CM | POA: Diagnosis not present

## 2022-01-04 DIAGNOSIS — D509 Iron deficiency anemia, unspecified: Secondary | ICD-10-CM | POA: Diagnosis not present

## 2022-01-04 LAB — CMP (CANCER CENTER ONLY)
ALT: 11 U/L (ref 0–44)
AST: 12 U/L — ABNORMAL LOW (ref 15–41)
Albumin: 4.3 g/dL (ref 3.5–5.0)
Alkaline Phosphatase: 43 U/L (ref 38–126)
Anion gap: 8 (ref 5–15)
BUN: 16 mg/dL (ref 6–20)
CO2: 26 mmol/L (ref 22–32)
Calcium: 9.2 mg/dL (ref 8.9–10.3)
Chloride: 106 mmol/L (ref 98–111)
Creatinine: 0.95 mg/dL (ref 0.44–1.00)
GFR, Estimated: >60 mL/min (ref >60)
Glucose, Bld: 93 mg/dL (ref 70–99)
Potassium: 3.9 mmol/L (ref 3.5–5.1)
Sodium: 140 mmol/L (ref 135–145)
Total Bilirubin: 0.8 mg/dL (ref 0.3–1.2)
Total Protein: 6.8 g/dL (ref 6.5–8.1)

## 2022-01-04 LAB — CBC WITH DIFFERENTIAL (CANCER CENTER ONLY)
Abs Immature Granulocytes: 0 10*3/uL (ref 0.00–0.07)
Basophils Absolute: 0 10*3/uL (ref 0.0–0.1)
Basophils Relative: 1 %
Eosinophils Absolute: 0 10*3/uL (ref 0.0–0.5)
Eosinophils Relative: 2 %
HCT: 31.1 % — ABNORMAL LOW (ref 36.0–46.0)
Hemoglobin: 10.7 g/dL — ABNORMAL LOW (ref 12.0–15.0)
Immature Granulocytes: 0 %
Lymphocytes Relative: 40 %
Lymphs Abs: 0.8 10*3/uL (ref 0.7–4.0)
MCH: 34.2 pg — ABNORMAL HIGH (ref 26.0–34.0)
MCHC: 34.4 g/dL (ref 30.0–36.0)
MCV: 99.4 fL (ref 80.0–100.0)
Monocytes Absolute: 0.1 10*3/uL (ref 0.1–1.0)
Monocytes Relative: 6 %
Neutro Abs: 1 10*3/uL — ABNORMAL LOW (ref 1.7–7.7)
Neutrophils Relative %: 51 %
Platelet Count: 110 10*3/uL — ABNORMAL LOW (ref 150–400)
RBC: 3.13 MIL/uL — ABNORMAL LOW (ref 3.87–5.11)
RDW: 16.9 % — ABNORMAL HIGH (ref 11.5–15.5)
Smear Review: NORMAL
WBC Count: 1.9 10*3/uL — ABNORMAL LOW (ref 4.0–10.5)
nRBC: 0 % (ref 0.0–0.2)

## 2022-01-04 MED ORDER — FULVESTRANT 250 MG/5ML IM SOSY
500.0000 mg | PREFILLED_SYRINGE | INTRAMUSCULAR | Status: DC
  Administered 2022-01-04: 500 mg via INTRAMUSCULAR
  Filled 2022-01-04: qty 10

## 2022-01-04 NOTE — Progress Notes (Signed)
Hematology and Oncology Follow Up Visit  Caitlin Greene [id number] [birth year] 56 y.o. 01/04/2022   Principle Diagnosis:  Metastatic breast cancer-ER positive/HER-2 negative --bone metastasis only Iron deficiency anemia  Current Therapy:   Faslodex 500 mg IM monthly --start on 04/2020 Ibrance 100 mg p.o. daily (21d on/7d off) - start on 04/2020 Xgeva 120 mg subcu every 3 months -next dose 09//2023 IV iron-Feraheme given on 09/14/2021     Interim History:  Caitlin Greene is in for follow-up.  She is doing pretty well.  She really has had no specific complaints since we last saw her.  Her birthday is coming up on Saturday.  I know that she will have a good time with her family.  Her last CA 27.29 was up a little bit more.  Is up to 56.  Her recent scans did not show any obvious evidence of progressive disease.  She has had no problems with the Ibrance.  She stopped the Ibrance I think yesterday for her 1 week off.  She has had no bleeding.  She did have a small episode of bleeding.  She has had GI bleeding before.  We checked her hemoglobin it was holding steady.  She has not noted any melena or hematochezia.  There is been no rashes.  She has had no swollen lymph nodes.  She has had no headaches.  Overall, I would say that her performance status is probably ECOG 1.    Medications:  Current Outpatient Medications:    calcium carbonate (OS-CAL) 600 MG tablet, Take 1 tablet (600 mg total) by mouth 2 (two) times daily., Disp: 30 tablet, Rfl: 0   Ferrous Sulfate (IRON) 325 (65 Fe) MG TABS, Take 1 tablet (325 mg total) by mouth daily at 2 PM., Disp: 30 tablet, Rfl: 0   FLUoxetine (PROZAC) 20 MG capsule, TAKE 3 CAPSULES(60 MG) BY MOUTH DAILY (Patient taking differently: 60 mg daily.), Disp: 270 capsule, Rfl: 3   fulvestrant (FASLODEX) 250 MG/5ML injection, Inject 500 mg into the muscle every 30 (thirty) days. One injection each buttock over 1-2 minutes. Warm prior to use., Disp: , Rfl:     IBRANCE 100 MG tablet, Take 100 mg by mouth See admin instructions. 114m daily on day 1-21 of cycle. 7 days off.  Repeat., Disp: , Rfl:    pantoprazole (PROTONIX) 40 MG tablet, Take 1 tablet (40 mg total) by mouth 2 (two) times daily., Disp: 60 tablet, Rfl: 2   vitamin B-12 (CYANOCOBALAMIN) 1000 MCG tablet, Take 1 tablet (1,000 mcg total) by mouth daily., Disp: 30 tablet, Rfl: 0   acetaminophen (TYLENOL) 650 MG CR tablet, Take 650 mg by mouth every 8 (eight) hours as needed for pain. (Patient not taking: Reported on 01/04/2022), Disp: , Rfl:    hydrOXYzine (ATARAX/VISTARIL) 25 MG tablet, Take 25 mg by mouth 3 (three) times daily as needed for anxiety. (Patient not taking: Reported on 01/04/2022), Disp: , Rfl:    ondansetron (ZOFRAN) 4 MG tablet, Take 1 tablet (4 mg total) by mouth every 4 (four) hours as needed. (Patient not taking: Reported on 01/04/2022), Disp: 20 tablet, Rfl: 0   oxyCODONE (OXY IR/ROXICODONE) 5 MG immediate release tablet, Take 1 tablet (5 mg total) by mouth every 8 (eight) hours as needed for severe pain. (Patient not taking: Reported on 01/04/2022), Disp: 50 tablet, Rfl: 0   polyethylene glycol (MIRALAX / GLYCOLAX) 17 g packet, Take 17 g by mouth daily as needed. (Patient not taking: Reported on 01/04/2022), Disp: 28 each,  Rfl: 0 No current facility-administered medications for this visit.  Facility-Administered Medications Ordered in Other Visits:    fulvestrant (FASLODEX) injection 500 mg, 500 mg, Intramuscular, Q30 days, Johnjoseph Rolfe, Rudell Cobb, MD  Allergies:  Allergies  Allergen Reactions   Amoxicillin-Pot Clavulanate Hives and Itching   Iron Anaphylaxis and Other (See Comments)    Patient develops hypotension, resp distress after infusion of feraheme.    Penicillins Hives, Itching and Other (See Comments)   Codeine Nausea And Vomiting    Needs pre-meds     Past Medical History, Surgical history, Social history, and Family History were reviewed and updated.  Review of  Systems: Review of Systems  Constitutional:  Positive for appetite change.  HENT:  Negative.    Eyes: Negative.   Respiratory: Negative.    Cardiovascular: Negative.   Gastrointestinal:  Positive for abdominal pain and nausea.  Endocrine: Negative.   Genitourinary: Negative.    Musculoskeletal:  Positive for arthralgias and back pain.  Skin: Negative.   Neurological:  Positive for dizziness.  Hematological: Negative.   Psychiatric/Behavioral: Negative.      Physical Exam:  height is 5' 8.5" (1.74 m) and weight is 198 lb (89.8 kg). Her oral temperature is 97.8 F (36.6 C). Her blood pressure is 128/79 and her pulse is 83. Her respiration is 18 and oxygen saturation is 99%.   Wt Readings from Last 3 Encounters:  01/04/22 198 lb (89.8 kg)  12/06/21 198 lb (89.8 kg)  11/05/21 198 lb (89.8 kg)    Physical Exam Vitals reviewed.  HENT:     Head: Normocephalic and atraumatic.  Eyes:     Pupils: Pupils are equal, round, and reactive to light.  Cardiovascular:     Rate and Rhythm: Normal rate and regular rhythm.     Heart sounds: Normal heart sounds.  Pulmonary:     Effort: Pulmonary effort is normal.     Breath sounds: Normal breath sounds.  Abdominal:     General: Bowel sounds are normal.     Palpations: Abdomen is soft.  Musculoskeletal:        General: No tenderness or deformity. Normal range of motion.     Cervical back: Normal range of motion.  Lymphadenopathy:     Cervical: No cervical adenopathy.  Skin:    General: Skin is warm and dry.     Findings: No erythema or rash.  Neurological:     Mental Status: She is alert and oriented to person, place, and time.  Psychiatric:        Behavior: Behavior normal.        Thought Content: Thought content normal.        Judgment: Judgment normal.      Lab Results  Component Value Date   WBC 1.9 (L) 01/04/2022   HGB 10.7 (L) 01/04/2022   HCT 31.1 (L) 01/04/2022   MCV 99.4 01/04/2022   PLT 110 (L) 01/04/2022      Chemistry      Component Value Date/Time   NA 140 01/04/2022 0938   K 3.9 01/04/2022 0938   CL 106 01/04/2022 0938   CO2 26 01/04/2022 0938   BUN 16 01/04/2022 0938   CREATININE 0.95 01/04/2022 0938   CREATININE 0.88 10/22/2019 1246      Component Value Date/Time   CALCIUM 9.2 01/04/2022 0938   ALKPHOS 43 01/04/2022 0938   AST 12 (L) 01/04/2022 0938   ALT 11 01/04/2022 0938   BILITOT 0.8 01/04/2022 1610  Impression and Plan: Ms. Dwyer is a very charming 56 year old postmenopausal female with metastatic breast cancer.  We actually had seen her many years ago with a carcinoma in situ.  She is the wife of one of our former patients who passed away from ocular melanoma.  Will be interesting to see what the CA 27.29 is.  Would be nice if we could ultimately get a biopsy on her.  However, her disease is only been in her bones.  As such, we have really not been able to get a biopsy to send off for molecular studies.  We will go ahead with her Faslodex today.  I will like to get her back to see [place] in another month.  When she comes back, she will get Faslodex along with Xgeva.

## 2022-01-04 NOTE — Patient Instructions (Signed)

## 2022-01-05 ENCOUNTER — Encounter: Payer: Self-pay | Admitting: *Deleted

## 2022-01-05 LAB — CANCER ANTIGEN 27.29: CA 27.29: 40.3 U/mL — ABNORMAL HIGH (ref 0.0–38.6)

## 2022-01-12 ENCOUNTER — Ambulatory Visit: Payer: BC Managed Care – PPO | Admitting: Physician Assistant

## 2022-01-14 DIAGNOSIS — C50911 Malignant neoplasm of unspecified site of right female breast: Secondary | ICD-10-CM | POA: Diagnosis not present

## 2022-02-01 ENCOUNTER — Inpatient Hospital Stay (HOSPITAL_BASED_OUTPATIENT_CLINIC_OR_DEPARTMENT_OTHER): Payer: BC Managed Care – PPO | Admitting: Hematology & Oncology

## 2022-02-01 ENCOUNTER — Inpatient Hospital Stay: Payer: BC Managed Care – PPO

## 2022-02-01 ENCOUNTER — Encounter: Payer: Self-pay | Admitting: Hematology & Oncology

## 2022-02-01 ENCOUNTER — Telehealth: Payer: Self-pay | Admitting: *Deleted

## 2022-02-01 ENCOUNTER — Other Ambulatory Visit: Payer: Self-pay

## 2022-02-01 ENCOUNTER — Other Ambulatory Visit (HOSPITAL_COMMUNITY): Payer: Self-pay

## 2022-02-01 ENCOUNTER — Inpatient Hospital Stay: Payer: BC Managed Care – PPO | Attending: Hematology & Oncology

## 2022-02-01 VITALS — BP 133/82 | HR 77 | Temp 97.7°F | Resp 18 | Ht 68.0 in | Wt 198.0 lb

## 2022-02-01 DIAGNOSIS — D510 Vitamin B12 deficiency anemia due to intrinsic factor deficiency: Secondary | ICD-10-CM

## 2022-02-01 DIAGNOSIS — Z5111 Encounter for antineoplastic chemotherapy: Secondary | ICD-10-CM | POA: Diagnosis not present

## 2022-02-01 DIAGNOSIS — D509 Iron deficiency anemia, unspecified: Secondary | ICD-10-CM | POA: Insufficient documentation

## 2022-02-01 DIAGNOSIS — Z79891 Long term (current) use of opiate analgesic: Secondary | ICD-10-CM | POA: Diagnosis not present

## 2022-02-01 DIAGNOSIS — G893 Neoplasm related pain (acute) (chronic): Secondary | ICD-10-CM | POA: Diagnosis not present

## 2022-02-01 DIAGNOSIS — C7951 Secondary malignant neoplasm of bone: Secondary | ICD-10-CM

## 2022-02-01 DIAGNOSIS — Z170 Estrogen receptor positive status [ER+]: Secondary | ICD-10-CM | POA: Diagnosis not present

## 2022-02-01 DIAGNOSIS — R978 Other abnormal tumor markers: Secondary | ICD-10-CM | POA: Insufficient documentation

## 2022-02-01 DIAGNOSIS — Z79899 Other long term (current) drug therapy: Secondary | ICD-10-CM | POA: Diagnosis not present

## 2022-02-01 DIAGNOSIS — C50919 Malignant neoplasm of unspecified site of unspecified female breast: Secondary | ICD-10-CM | POA: Insufficient documentation

## 2022-02-01 DIAGNOSIS — K921 Melena: Secondary | ICD-10-CM

## 2022-02-01 LAB — CBC WITH DIFFERENTIAL (CANCER CENTER ONLY)
Abs Immature Granulocytes: 0.01 10*3/uL (ref 0.00–0.07)
Basophils Absolute: 0 10*3/uL (ref 0.0–0.1)
Basophils Relative: 2 %
Eosinophils Absolute: 0 10*3/uL (ref 0.0–0.5)
Eosinophils Relative: 2 %
HCT: 33.6 % — ABNORMAL LOW (ref 36.0–46.0)
Hemoglobin: 11.5 g/dL — ABNORMAL LOW (ref 12.0–15.0)
Immature Granulocytes: 1 %
Lymphocytes Relative: 34 %
Lymphs Abs: 0.8 10*3/uL (ref 0.7–4.0)
MCH: 34.8 pg — ABNORMAL HIGH (ref 26.0–34.0)
MCHC: 34.2 g/dL (ref 30.0–36.0)
MCV: 101.8 fL — ABNORMAL HIGH (ref 80.0–100.0)
Monocytes Absolute: 0.2 10*3/uL (ref 0.1–1.0)
Monocytes Relative: 8 %
Neutro Abs: 1.2 10*3/uL — ABNORMAL LOW (ref 1.7–7.7)
Neutrophils Relative %: 53 %
Platelet Count: 198 10*3/uL (ref 150–400)
RBC: 3.3 MIL/uL — ABNORMAL LOW (ref 3.87–5.11)
RDW: 16 % — ABNORMAL HIGH (ref 11.5–15.5)
Smear Review: NORMAL
WBC Count: 2.2 10*3/uL — ABNORMAL LOW (ref 4.0–10.5)
nRBC: 0 % (ref 0.0–0.2)

## 2022-02-01 LAB — IRON AND IRON BINDING CAPACITY (CC-WL,HP ONLY)
Iron: 146 ug/dL (ref 28–170)
Saturation Ratios: 38 % — ABNORMAL HIGH (ref 10.4–31.8)
TIBC: 386 ug/dL (ref 250–450)
UIBC: 240 ug/dL (ref 148–442)

## 2022-02-01 LAB — CMP (CANCER CENTER ONLY)
ALT: 11 U/L (ref 0–44)
AST: 12 U/L — ABNORMAL LOW (ref 15–41)
Albumin: 4.5 g/dL (ref 3.5–5.0)
Alkaline Phosphatase: 48 U/L (ref 38–126)
Anion gap: 8 (ref 5–15)
BUN: 18 mg/dL (ref 6–20)
CO2: 28 mmol/L (ref 22–32)
Calcium: 10.7 mg/dL — ABNORMAL HIGH (ref 8.9–10.3)
Chloride: 103 mmol/L (ref 98–111)
Creatinine: 1.02 mg/dL — ABNORMAL HIGH (ref 0.44–1.00)
GFR, Estimated: >60 mL/min (ref >60)
Glucose, Bld: 97 mg/dL (ref 70–99)
Potassium: 3.7 mmol/L (ref 3.5–5.1)
Sodium: 139 mmol/L (ref 135–145)
Total Bilirubin: 0.7 mg/dL (ref 0.3–1.2)
Total Protein: 7.2 g/dL (ref 6.5–8.1)

## 2022-02-01 LAB — LACTATE DEHYDROGENASE: LDH: 187 U/L (ref 98–192)

## 2022-02-01 LAB — VITAMIN B12: Vitamin B-12: 337 pg/mL (ref 180–914)

## 2022-02-01 LAB — FERRITIN: Ferritin: 15 ng/mL (ref 11–307)

## 2022-02-01 MED ORDER — DENOSUMAB 120 MG/1.7ML ~~LOC~~ SOLN
120.0000 mg | Freq: Once | SUBCUTANEOUS | Status: AC
  Administered 2022-02-01: 120 mg via SUBCUTANEOUS
  Filled 2022-02-01: qty 1.7

## 2022-02-01 MED ORDER — IBRANCE 100 MG PO TABS: 100.0000 mg | ORAL_TABLET | ORAL | 0 refills | Status: DC

## 2022-02-01 MED ORDER — FULVESTRANT 250 MG/5ML IM SOSY
500.0000 mg | PREFILLED_SYRINGE | INTRAMUSCULAR | Status: DC
  Administered 2022-02-01: 500 mg via INTRAMUSCULAR
  Filled 2022-02-01: qty 10

## 2022-02-01 NOTE — Progress Notes (Signed)
Hematology and Oncology Follow Up Visit  Caitlin Greene [id number] 16-Oct-1965 56 y.o. 02/01/2022   Principle Diagnosis:  Metastatic breast cancer-ER positive/HER-2 negative --bone metastasis only Iron deficiency anemia  Current Therapy:   Faslodex 500 mg IM monthly --start on 04/2020 Ibrance 100 mg p.o. daily (21d on/7d off) - start on 04/2020 Xgeva 120 mg subcu every 3 months -next dose 12//2023 IV iron-Feraheme given on 09/14/2021     Interim History:  Caitlin Greene is in for follow-up.  She did have a nice birthday over Labor Day weekend.  She really had a good time with her family.  She feels good.  She is really had had no complaints.  There is no problems with bony pain.  She is on oxycodone for bone pain.  She has had no issues with the Ibrance.  She has had no nausea or vomiting.  Her weight is about the same.  There is no change in bowel or bladder habits.  She has had no GI bleeding.  Her last CA 27.29 was little bit lower at 40.  She has had no rashes.  There has been no leg swelling.  She has had no cough or shortness of breath.  She has had no fever.  She has had no headache.  Overall, I would say performance status is probably ECOG 1.    Medications:  Current Outpatient Medications:    calcium carbonate (OS-CAL) 600 MG tablet, Take 1 tablet (600 mg total) by mouth 2 (two) times daily., Disp: 30 tablet, Rfl: 0   Ferrous Sulfate (IRON) 325 (65 Fe) MG TABS, Take 1 tablet (325 mg total) by mouth daily at 2 PM., Disp: 30 tablet, Rfl: 0   FLUoxetine (PROZAC) 20 MG capsule, TAKE 3 CAPSULES(60 MG) BY MOUTH DAILY (Patient taking differently: 60 mg daily.), Disp: 270 capsule, Rfl: 3   fulvestrant (FASLODEX) 250 MG/5ML injection, Inject 500 mg into the muscle every 30 (thirty) days. One injection each buttock over 1-2 minutes. Warm prior to use., Disp: , Rfl:    IBRANCE 100 MG tablet, Take 100 mg by mouth See admin instructions. 156m daily on day 1-21 of cycle. 7 days off.  Repeat.,  Disp: , Rfl:    pantoprazole (PROTONIX) 40 MG tablet, Take 1 tablet (40 mg total) by mouth 2 (two) times daily., Disp: 60 tablet, Rfl: 2   vitamin B-12 (CYANOCOBALAMIN) 1000 MCG tablet, Take 1 tablet (1,000 mcg total) by mouth daily., Disp: 30 tablet, Rfl: 0   acetaminophen (TYLENOL) 650 MG CR tablet, Take 650 mg by mouth every 8 (eight) hours as needed for pain. (Patient not taking: Reported on 01/04/2022), Disp: , Rfl:    hydrOXYzine (ATARAX/VISTARIL) 25 MG tablet, Take 25 mg by mouth 3 (three) times daily as needed for anxiety. (Patient not taking: Reported on 01/04/2022), Disp: , Rfl:    ondansetron (ZOFRAN) 4 MG tablet, Take 1 tablet (4 mg total) by mouth every 4 (four) hours as needed. (Patient not taking: Reported on 01/04/2022), Disp: 20 tablet, Rfl: 0   oxyCODONE (OXY IR/ROXICODONE) 5 MG immediate release tablet, Take 1 tablet (5 mg total) by mouth every 8 (eight) hours as needed for severe pain. (Patient not taking: Reported on 01/04/2022), Disp: 50 tablet, Rfl: 0   polyethylene glycol (MIRALAX / GLYCOLAX) 17 g packet, Take 17 g by mouth daily as needed. (Patient not taking: Reported on 01/04/2022), Disp: 28 each, Rfl: 0  Allergies:  Allergies  Allergen Reactions   Amoxicillin-Pot Clavulanate Hives and Itching  Iron Anaphylaxis and Other (See Comments)    Patient develops hypotension, resp distress after infusion of feraheme.    Penicillins Hives, Itching and Other (See Comments)   Codeine Nausea And Vomiting    Needs pre-meds     Past Medical History, Surgical history, Social history, and Family History were reviewed and updated.  Review of Systems: Review of Systems  Constitutional:  Positive for appetite change.  HENT:  Negative.    Eyes: Negative.   Respiratory: Negative.    Cardiovascular: Negative.   Gastrointestinal:  Positive for abdominal pain and nausea.  Endocrine: Negative.   Genitourinary: Negative.    Musculoskeletal:  Positive for arthralgias and back pain.   Skin: Negative.   Neurological:  Positive for dizziness.  Hematological: Negative.   Psychiatric/Behavioral: Negative.      Physical Exam:  height is 5' 8"  (1.727 m) and weight is 198 lb (89.8 kg). Her oral temperature is 97.7 F (36.5 C). Her blood pressure is 133/82 and her pulse is 77. Her respiration is 18 and oxygen saturation is 100%.   Wt Readings from Last 3 Encounters:  02/01/22 198 lb (89.8 kg)  01/04/22 198 lb (89.8 kg)  12/06/21 198 lb (89.8 kg)    Physical Exam Vitals reviewed.  HENT:     Head: Normocephalic and atraumatic.  Eyes:     Pupils: Pupils are equal, round, and reactive to light.  Cardiovascular:     Rate and Rhythm: Normal rate and regular rhythm.     Heart sounds: Normal heart sounds.  Pulmonary:     Effort: Pulmonary effort is normal.     Breath sounds: Normal breath sounds.  Abdominal:     General: Bowel sounds are normal.     Palpations: Abdomen is soft.  Musculoskeletal:        General: No tenderness or deformity. Normal range of motion.     Cervical back: Normal range of motion.  Lymphadenopathy:     Cervical: No cervical adenopathy.  Skin:    General: Skin is warm and dry.     Findings: No erythema or rash.  Neurological:     Mental Status: She is alert and oriented to person, place, and time.  Psychiatric:        Behavior: Behavior normal.        Thought Content: Thought content normal.        Judgment: Judgment normal.      Lab Results  Component Value Date   WBC 2.2 (L) 02/01/2022   HGB 11.5 (L) 02/01/2022   HCT 33.6 (L) 02/01/2022   MCV 101.8 (H) 02/01/2022   PLT 198 02/01/2022     Chemistry      Component Value Date/Time   NA 139 02/01/2022 0830   K 3.7 02/01/2022 0830   CL 103 02/01/2022 0830   CO2 28 02/01/2022 0830   BUN 18 02/01/2022 0830   CREATININE 1.02 (H) 02/01/2022 0830   CREATININE 0.88 10/22/2019 1246      Component Value Date/Time   CALCIUM 10.7 (H) 02/01/2022 0830   ALKPHOS 48 02/01/2022 0830    AST 12 (L) 02/01/2022 0830   ALT 11 02/01/2022 0830   BILITOT 0.7 02/01/2022 0830      Impression and Plan: Caitlin Greene is a very charming 56 year old postmenopausal female with metastatic breast cancer.  We actually had seen her many years ago with a carcinoma in situ.  I am just happy that she is doing well.  I am glad that  she had a wonderful birthday.  Everything seems to be holding nice and stable for her.  She will get her Faslodex and Xgeva today.  We probably will repeat her scans sometime in November or December.  Everything is been pretty stable so I do not think we have to do scans every 3 months.

## 2022-02-01 NOTE — Patient Instructions (Signed)

## 2022-02-01 NOTE — Telephone Encounter (Signed)
Received documents from [facility] requesting medical records - sent documents to [place] - HIM through intra-office mail.

## 2022-02-02 DIAGNOSIS — C50911 Malignant neoplasm of unspecified site of right female breast: Secondary | ICD-10-CM | POA: Diagnosis not present

## 2022-02-02 LAB — CANCER ANTIGEN 27.29: CA 27.29: 47.6 U/mL — ABNORMAL HIGH (ref 0.0–38.6)

## 2022-02-06 DIAGNOSIS — J189 Pneumonia, unspecified organism: Secondary | ICD-10-CM

## 2022-02-06 HISTORY — DX: Pneumonia, unspecified organism: J18.9

## 2022-02-14 ENCOUNTER — Ambulatory Visit: Payer: BC Managed Care – PPO | Admitting: Physician Assistant

## 2022-02-14 ENCOUNTER — Emergency Department (HOSPITAL_BASED_OUTPATIENT_CLINIC_OR_DEPARTMENT_OTHER)
Admission: EM | Admit: 2022-02-14 | Discharge: 2022-02-14 | Disposition: A | Discharge status: Home / Self Care | Payer: BC Managed Care – PPO | Attending: Emergency Medicine | Admitting: Emergency Medicine

## 2022-02-14 ENCOUNTER — Emergency Department (HOSPITAL_BASED_OUTPATIENT_CLINIC_OR_DEPARTMENT_OTHER): Payer: BC Managed Care – PPO

## 2022-02-14 ENCOUNTER — Other Ambulatory Visit: Payer: Self-pay

## 2022-02-14 ENCOUNTER — Ambulatory Visit: Payer: BC Managed Care – PPO | Admitting: Internal Medicine

## 2022-02-14 ENCOUNTER — Encounter (HOSPITAL_BASED_OUTPATIENT_CLINIC_OR_DEPARTMENT_OTHER): Payer: Self-pay | Admitting: Urology

## 2022-02-14 DIAGNOSIS — C50919 Malignant neoplasm of unspecified site of unspecified female breast: Secondary | ICD-10-CM | POA: Diagnosis not present

## 2022-02-14 DIAGNOSIS — R42 Dizziness and giddiness: Secondary | ICD-10-CM | POA: Diagnosis not present

## 2022-02-14 DIAGNOSIS — J181 Lobar pneumonia, unspecified organism: Secondary | ICD-10-CM | POA: Diagnosis not present

## 2022-02-14 DIAGNOSIS — Z853 Personal history of malignant neoplasm of breast: Secondary | ICD-10-CM | POA: Insufficient documentation

## 2022-02-14 DIAGNOSIS — R059 Cough, unspecified: Secondary | ICD-10-CM | POA: Diagnosis not present

## 2022-02-14 DIAGNOSIS — J9811 Atelectasis: Secondary | ICD-10-CM | POA: Diagnosis not present

## 2022-02-14 DIAGNOSIS — K449 Diaphragmatic hernia without obstruction or gangrene: Secondary | ICD-10-CM | POA: Diagnosis not present

## 2022-02-14 DIAGNOSIS — Z20822 Contact with and (suspected) exposure to covid-19: Secondary | ICD-10-CM | POA: Diagnosis not present

## 2022-02-14 DIAGNOSIS — J189 Pneumonia, unspecified organism: Secondary | ICD-10-CM | POA: Diagnosis not present

## 2022-02-14 DIAGNOSIS — R0602 Shortness of breath: Secondary | ICD-10-CM | POA: Diagnosis not present

## 2022-02-14 LAB — COMPREHENSIVE METABOLIC PANEL
ALT: 14 U/L (ref 0–44)
AST: 20 U/L (ref 15–41)
Albumin: 4.2 g/dL (ref 3.5–5.0)
Alkaline Phosphatase: 50 U/L (ref 38–126)
Anion gap: 9 (ref 5–15)
BUN: 16 mg/dL (ref 6–20)
CO2: 25 mmol/L (ref 22–32)
Calcium: 9.4 mg/dL (ref 8.9–10.3)
Chloride: 103 mmol/L (ref 98–111)
Creatinine, Ser: 1 mg/dL (ref 0.44–1.00)
GFR, Estimated: >60 mL/min (ref >60)
Glucose, Bld: 97 mg/dL (ref 70–99)
Potassium: 3.7 mmol/L (ref 3.5–5.1)
Sodium: 137 mmol/L (ref 135–145)
Total Bilirubin: 0.4 mg/dL (ref 0.3–1.2)
Total Protein: 7.9 g/dL (ref 6.5–8.1)

## 2022-02-14 LAB — CBC WITH DIFFERENTIAL/PLATELET
Abs Immature Granulocytes: 0 10*3/uL (ref 0.00–0.07)
Basophils Absolute: 0.1 10*3/uL (ref 0.0–0.1)
Basophils Relative: 2 %
Eosinophils Absolute: 0.1 10*3/uL (ref 0.0–0.5)
Eosinophils Relative: 2 %
HCT: 36 % (ref 36.0–46.0)
Hemoglobin: 12.5 g/dL (ref 12.0–15.0)
Immature Granulocytes: 0 %
Lymphocytes Relative: 30 %
Lymphs Abs: 0.8 10*3/uL (ref 0.7–4.0)
MCH: 35.2 pg — ABNORMAL HIGH (ref 26.0–34.0)
MCHC: 34.7 g/dL (ref 30.0–36.0)
MCV: 101.4 fL — ABNORMAL HIGH (ref 80.0–100.0)
Monocytes Absolute: 0.4 10*3/uL (ref 0.1–1.0)
Monocytes Relative: 15 %
Neutro Abs: 1.4 10*3/uL — ABNORMAL LOW (ref 1.7–7.7)
Neutrophils Relative %: 51 %
Platelets: 204 10*3/uL (ref 150–400)
RBC: 3.55 MIL/uL — ABNORMAL LOW (ref 3.87–5.11)
RDW: 16.1 % — ABNORMAL HIGH (ref 11.5–15.5)
WBC: 2.8 10*3/uL — ABNORMAL LOW (ref 4.0–10.5)
nRBC: 0 % (ref 0.0–0.2)

## 2022-02-14 LAB — TROPONIN I (HIGH SENSITIVITY): Troponin I (High Sensitivity): <2 ng/L (ref <18)

## 2022-02-14 LAB — LACTIC ACID, PLASMA
Lactic Acid, Venous: 1.3 mmol/L (ref 0.5–1.9)
Lactic Acid, Venous: 1.3 mmol/L (ref 0.5–1.9)

## 2022-02-14 LAB — RESP PANEL BY RT-PCR (FLU A&B, COVID) ARPGX2
Influenza A by PCR: NEGATIVE
Influenza B by PCR: NEGATIVE
SARS Coronavirus 2 by RT PCR: NEGATIVE

## 2022-02-14 MED ORDER — SODIUM CHLORIDE 0.9 % IV SOLN
1.0000 g | Freq: Once | INTRAVENOUS | Status: AC
  Administered 2022-02-14: 1 g via INTRAVENOUS
  Filled 2022-02-14: qty 10

## 2022-02-14 MED ORDER — DIPHENHYDRAMINE HCL 50 MG/ML IJ SOLN
12.5000 mg | Freq: Once | INTRAMUSCULAR | Status: AC
  Administered 2022-02-14: 12.5 mg via INTRAVENOUS
  Filled 2022-02-14: qty 1

## 2022-02-14 MED ORDER — DOXYCYCLINE HYCLATE 100 MG PO CAPS: 100.0000 mg | ORAL_CAPSULE | Freq: Two times a day (BID) | ORAL | 0 refills | Status: DC

## 2022-02-14 MED ORDER — LEVOFLOXACIN 500 MG PO TABS: 500.0000 mg | ORAL_TABLET | Freq: Every day | ORAL | 0 refills | Status: DC

## 2022-02-14 MED ORDER — SODIUM CHLORIDE 0.9 % IV BOLUS
1000.0000 mL | Freq: Once | INTRAVENOUS | Status: AC
  Administered 2022-02-14: 1000 mL via INTRAVENOUS

## 2022-02-14 MED ORDER — DOXYCYCLINE HYCLATE 100 MG PO TABS
100.0000 mg | ORAL_TABLET | Freq: Once | ORAL | Status: AC
  Administered 2022-02-14: 100 mg via ORAL
  Filled 2022-02-14: qty 1

## 2022-02-14 MED ORDER — ALBUTEROL SULFATE HFA 108 (90 BASE) MCG/ACT IN AERS
2.0000 | INHALATION_SPRAY | Freq: Once | RESPIRATORY_TRACT | Status: AC
  Administered 2022-02-14: 2 via RESPIRATORY_TRACT
  Filled 2022-02-14: qty 6.7

## 2022-02-14 MED ORDER — IPRATROPIUM-ALBUTEROL 0.5-2.5 (3) MG/3ML IN SOLN
3.0000 mL | Freq: Once | RESPIRATORY_TRACT | Status: AC
  Administered 2022-02-14: 3 mL via RESPIRATORY_TRACT
  Filled 2022-02-14: qty 3

## 2022-02-14 MED ORDER — METOCLOPRAMIDE HCL 5 MG/ML IJ SOLN
5.0000 mg | Freq: Once | INTRAMUSCULAR | Status: AC
  Administered 2022-02-14: 5 mg via INTRAVENOUS
  Filled 2022-02-14: qty 2

## 2022-02-14 MED ORDER — IOHEXOL 350 MG/ML SOLN
80.0000 mL | Freq: Once | INTRAVENOUS | Status: AC | PRN
  Administered 2022-02-14: 80 mL via INTRAVENOUS

## 2022-02-14 NOTE — ED Notes (Signed)
Multiple attempts to start IV unsuccessful in left arm due to cancer in rt breast

## 2022-02-14 NOTE — Discharge Instructions (Addendum)
1) do not take your Ibrance until instructed by Dr. Marin Olp as we do not want to lower your white blood cell count.  2) you may use the inhaler 1 to 2 puffs every 4-6 hours as needed for coughing.  3) you can use over-the-counter medications like guaifenesin which can help lower the surface tension of any phlegm in your chest and help you expectorate the phlegm in your chest.  4) call Dr. Antonieta Pert nurse first thing tomorrow morning to talk about your ER visit and to get further instructions.  Get help right away if: Your shortness of breath becomes worse. Your chest pain increases. Your sickness becomes worse, especially if you are an older adult or have a weak immune system. You cough up blood.

## 2022-02-14 NOTE — ED Triage Notes (Signed)
Pt reports cough that started 5 days ago and has progressively gotten worse States productive cough with green plegm States rib pain from cough  Current Breast CA patient

## 2022-02-14 NOTE — ED Provider Notes (Signed)
[facility]PARTMENT Provider Note   CSN: [id number] Arrival date & time: 02/14/22  1205     History {Add pertinent medical, surgical, social history, OB history to HPI:1} Chief Complaint  Patient presents with   Cough    Caitlin Greene is a 56 y.o. female with a past medical history of stage IV metastatic breast cancer who presents to the emergency department with chief complaint of cough and shortness of breath.  Patient had onset of URI symptoms beginning 5 days ago.  She states that Friday she went to sleep and when she woke up she had painful cough productive of green sputum, chills at home, exertional shortness of breath, wheezing.  She denies unilateral leg swelling.  She does not take a blood thinner.  She also complains of vertigo and points to her right ear.  She states that when she turns her head to the right she feels dizzy and feels like she is spinning.  This is relieved when she brings her head midline.  Denies vomiting.  She has a past medical history of gastrointestinal hemorrhage secondary to acute gastritis.   Cough      Home Medications Prior to Admission medications   Medication Sig Start Date End Date Taking? Authorizing Provider  acetaminophen (TYLENOL) 650 MG CR tablet Take 650 mg by mouth every 8 (eight) hours as needed for pain. Patient not taking: Reported on 01/04/2022    [provider]  calcium carbonate (OS-CAL) 600 MG tablet Take 1 tablet (600 mg total) by mouth 2 (two) times daily. 10/21/21   Regalado, Belkys A, MD  Ferrous Sulfate (IRON) 325 (65 Fe) MG TABS Take 1 tablet (325 mg total) by mouth daily at 2 PM. 10/26/21   Milus Banister, MD  FLUoxetine (PROZAC) 20 MG capsule TAKE 3 CAPSULES(60 MG) BY MOUTH DAILY Patient taking differently: 60 mg daily. 06/25/21   Biagio Borg, MD  fulvestrant (FASLODEX) 250 MG/5ML injection Inject 500 mg into the muscle every 30 (thirty) days. One injection each buttock over 1-2 minutes.  Warm prior to use.    [provider]  hydrOXYzine (ATARAX/VISTARIL) 25 MG tablet Take 25 mg by mouth 3 (three) times daily as needed for anxiety. Patient not taking: Reported on 01/04/2022 12/31/19   [provider]  IBRANCE 100 MG tablet Take 1 tablet (100 mg total) by mouth See admin instructions. '100mg'$  daily on day 1-21 of cycle. 7 days off.  Repeat. 02/01/22   [name], MD  ondansetron (ZOFRAN) 4 MG tablet Take 1 tablet (4 mg total) by mouth every 4 (four) hours as needed. Patient not taking: Reported on 01/04/2022 03/24/20   Shelly Coss, MD  oxyCODONE (OXY IR/ROXICODONE) 5 MG immediate release tablet Take 1 tablet (5 mg total) by mouth every 8 (eight) hours as needed for severe pain. Patient not taking: Reported on 01/04/2022 11/06/20   [name], MD  pantoprazole (PROTONIX) 40 MG tablet Take 1 tablet (40 mg total) by mouth 2 (two) times daily. 10/21/21   Regalado, Belkys A, MD  polyethylene glycol (MIRALAX / GLYCOLAX) 17 g packet Take 17 g by mouth daily as needed. Patient not taking: Reported on 01/04/2022 03/24/20   Shelly Coss, MD  vitamin B-12 (CYANOCOBALAMIN) 1000 MCG tablet Take 1 tablet (1,000 mcg total) by mouth daily. 10/21/21   Regalado, Jerald Kief A, MD      Allergies    Amoxicillin-pot clavulanate, Iron, Penicillins, and Codeine    Review of Systems  Review of Systems  Respiratory:  Positive for cough.     Physical Exam Updated Vital Signs BP (!) 128/99 (BP Location: Right Arm)   Pulse (!) 128   Temp (!) 97.5 F (36.4 C) (Oral)   Resp 18   Ht '5\' 8"'$  (1.727 m)   Wt 87.1 kg   LMP 06/09/2017 (Exact Date)   SpO2 98%   BMI 29.19 kg/m  Physical Exam Vitals and nursing note reviewed.  Constitutional:      General: She is not in acute distress.    Appearance: She is well-developed. She is ill-appearing. She is not diaphoretic.  HENT:     Head: Normocephalic and atraumatic.     Right Ear: External ear normal.     Left Ear: External ear  normal.     Nose: Nose normal.     Mouth/Throat:     Mouth: Mucous membranes are moist.  Eyes:     General: No scleral icterus.    Conjunctiva/sclera: Conjunctivae normal.  Cardiovascular:     Rate and Rhythm: Normal rate and regular rhythm.     Heart sounds: Normal heart sounds. No murmur heard.    No friction rub. No gallop.  Pulmonary:     Effort: Pulmonary effort is normal. No respiratory distress.     Breath sounds: No decreased air movement. Examination of the right-middle field reveals rhonchi. Rhonchi present.  Abdominal:     General: Bowel sounds are normal. There is no distension.     Palpations: Abdomen is soft. There is no mass.     Tenderness: There is no abdominal tenderness. There is no guarding.  Musculoskeletal:     Cervical back: Normal range of motion.  Skin:    General: Skin is warm and dry.  Neurological:     Mental Status: She is alert and oriented to person, place, and time.  Psychiatric:        Behavior: Behavior normal.     ED Results / Procedures / Treatments   Labs (all labs ordered are listed, but only abnormal results are displayed) Labs Reviewed  RESP PANEL BY RT-PCR (FLU A&B, COVID) [id number]  CULTURE, BLOOD (ROUTINE X 2)  CULTURE, BLOOD (ROUTINE X 2)  COMPREHENSIVE METABOLIC PANEL  CBC WITH DIFFERENTIAL/PLATELET  LACTIC ACID, PLASMA  LACTIC ACID, PLASMA  TROPONIN I (HIGH SENSITIVITY)    EKG None  Radiology DG Chest 2 View  Result Date: 02/14/2022 CLINICAL DATA:  Provided history: Cough, productive, immunocompromised. Additional history provided: Breast cancer. EXAM: CHEST - 2 VIEW COMPARISON:  Nuclear medicine whole-body bone scan 12/03/2021. CT chest/abdomen/pelvis 12/03/2021. Prior chest radiographs 10/16/2021 and earlier. FINDINGS: Heart size within normal limits. Ill-defined opacities within the right middle lobe, suspicious for pneumonia given the provided history. No appreciable airspace consolidation on the left. No evidence of  pleural effusion or pneumothorax. Known multifocal osseous metastatic disease. Chronic thoracic vertebral compression fractures status post vertebral augmentation. Sizable hiatal hernia. IMPRESSION: Opacities within the right middle lobe, suspicious for pneumonia given the provided history. Followup PA and lateral chest radiographs are recommended in 3-4 weeks following a trial of antibiotic therapy to ensure resolution and to exclude underlying malignancy. Sizable hiatal hernia. Electronically Signed   By: Kellie Simmering D.O.   On: 02/14/2022 13:00    Procedures Procedures  {Document cardiac monitor, telemetry assessment procedure when appropriate:1}  Medications Ordered in ED Medications  sodium chloride 0.9 % bolus 1,000 mL (has no administration in time range)    ED Course/ Medical Decision  Making/ A&P Clinical Course as of 02/14/22 1918  Mon Feb 14, 2022  1628 WBC(!): 2.8 [AH]  1750 She has taken Keflex in the past without issue.  [AH]  1751 Case discussed with Dr. Malachy Mood via phone.  We reviewed her labs, her current medication regimen.  He feels that her neutropenia is extremely mild and she can be given normal treatment for community-acquired pneumonia and follow closely in the morning with Dr. Antonieta Pert nurse via telephone call. [AH]    Clinical Course User Index [AH] Margarita Mail, PA-C                           Medical Decision Making 56 year old female here with cough and shortness of breath.  Noted to be febrile, tachycardic upon arrival likely secondary to dehydration.  Patient noted to have community-acquired pneumonia.  Initially considered Rocephin and doxycycline.  I gave her a dose of doxycycline and Rocephin here.  She became nauseated.  After consideration given her history of hiatal hernia, serrations from her hiatal hernia and GI bleeds a reconsidered her medications and will discharge with Levaquin which she has tolerated in the past.  It is to follow closely with Dr.  Marin Olp tomorrow.  Amount and/or Complexity of Data Reviewed Labs: ordered. Decision-making details documented in ED Course. Radiology: ordered.  Risk Prescription drug management.   ***  {Document critical care time when appropriate:1} {Document review of labs and clinical decision tools ie heart score, Chads2Vasc2 etc:1}  {Document your independent review of radiology images, and any outside records:1} {Document your discussion with family members, caretakers, and with consultants:1} {Document social determinants of health affecting pt's care:1} {Document your decision making why or why not admission, treatments were needed:1} Final Clinical Impression(s) / ED Diagnoses Final diagnoses:  None    Rx / DC Orders ED Discharge Orders     None

## 2022-02-14 NOTE — ED Notes (Signed)
To CT

## 2022-02-14 NOTE — Progress Notes (Signed)
MDI and flutter valve given to take home. Instructed patient on how to use both.

## 2022-02-15 ENCOUNTER — Telehealth: Payer: Self-pay

## 2022-02-15 NOTE — Telephone Encounter (Signed)
Patient called states she went o ED yesterday and was diagnoses with Pneumonia and the ED Dr mentioned holding off on starting her [facility] and to follow up with Dr.Ennever. Per Dr.Ennever pt to hold for 3 weeks. Keep her follow up for 10/24. Called and informed pt. She denies any other questions or concerns at this time.

## 2022-02-16 ENCOUNTER — Telehealth: Payer: Self-pay

## 2022-02-16 NOTE — Telephone Encounter (Signed)
Transition Care Management Follow-up Telephone Call Date of discharge and from where: [facility]  How have you been since you were released from the hospital?        -Patient feels good as abx are helping Any questions or concerns? No  Items Reviewed: Did the pt receive and understand the discharge instructions provided? Yes  Medications obtained and verified? Yes  Other? Yes  Any new allergies since your discharge? No  Dietary orders reviewed? No Do you have support at home? Yes   Home Care and Equipment/Supplies: Were home health services ordered? not applicable If so, what is the name of the agency?  Has the agency set up a time to come to the patient's home? not applicable Were any new equipment or medical supplies ordered?  No What is the name of the medical supply agency?  Were you able to get the supplies/equipment? not applicable Do you have any questions related to the use of the equipment or supplies? No  Functional Questionnaire: (I = Independent and D = Dependent) ADLs:   Bathing/Dressing-   Meal Prep-   Eating-   Maintaining continence-   Transferring/Ambulation-   Managing Meds-   Follow up appointments reviewed:  PCP Hospital f/u appt confirmed? No   Specialist Hospital f/u appt confirmed? Yes  Scheduled to see Dr.Ennever on 03/01/2022 Are transportation arrangements needed? No  If their condition worsens, is the pt aware to call PCP or go to the Emergency Dept.? Yes Was the patient provided with contact information for the PCP's office or ED? Yes Was to pt encouraged to call back with questions or concerns? Yes

## 2022-02-19 LAB — CULTURE, BLOOD (ROUTINE X 2)
Culture: NO GROWTH
Culture: NO GROWTH
Special Requests: ADEQUATE
Special Requests: ADEQUATE

## 2022-02-21 ENCOUNTER — Other Ambulatory Visit: Payer: Self-pay | Admitting: Hematology & Oncology

## 2022-02-22 ENCOUNTER — Encounter: Payer: Self-pay | Admitting: Family Medicine

## 2022-02-22 ENCOUNTER — Ambulatory Visit (INDEPENDENT_AMBULATORY_CARE_PROVIDER_SITE_OTHER): Payer: BC Managed Care – PPO | Admitting: Family Medicine

## 2022-02-22 VITALS — BP 108/80 | HR 114 | Temp 98.0°F | Ht 68.0 in | Wt 195.4 lb

## 2022-02-22 DIAGNOSIS — R062 Wheezing: Secondary | ICD-10-CM

## 2022-02-22 DIAGNOSIS — J189 Pneumonia, unspecified organism: Secondary | ICD-10-CM | POA: Diagnosis not present

## 2022-02-22 MED ORDER — METHYLPREDNISOLONE ACETATE 80 MG/ML IJ SUSP
80.0000 mg | Freq: Once | INTRAMUSCULAR | Status: AC
  Administered 2022-02-22: 80 mg via INTRAMUSCULAR

## 2022-02-22 NOTE — Progress Notes (Signed)
Established Patient Office Visit  Subjective   Patient ID: Caitlin Greene, female    DOB: 06-16-1965  Age: 56 y.o. MRN: [id number]  Chief Complaint  Patient presents with   Cough    Patient complains of cough, x1 week, Productive cough with greenish sputum   Shortness of Breath    Patient complains of shortness of breath    Wheezing    HPI   Ms. Caitlin Greene is seen as a work in.  She has history of metastatic breast cancer.  She states that a week ago Saturday she developed some symptoms of cough and 8 days ago went to [facility] emergency room and diagnosed with right middle lobe pneumonia.  She had presented there with cough productive of green sputum and chills without documented fever.  She also had some shortness of breath and wheezing.  Her chest x-ray showed probable right middle lobe pneumonia.  She had CT angiogram of the chest which showed no pulmonary embolus and confirmed likely infection right middle lobe.  She was given albuterol inhaler and started on Levaquin 500 mg once daily for 7 days.  She does have history of some chronic leukopenia.  Her last white count was 2.8 thousand.  She has not had any fever.  Her cough is relatively stable but she has had some ongoing wheezing.  Only using her albuterol once daily.  No nausea or vomiting.  Past Medical History:  Diagnosis Date   ALLERGIC RHINITIS    ANEMIA-IRON DEFICIENCY    ANXIETY    BREAST CANCER, HX OF 01/21/2007   at 56yo   DEPRESSION    GERD    HYPERLIPIDEMIA    Metastatic cancer to bone ([place]) dx'd 10/24/2019   recurrent breast ca   RENAL CALCULUS    rt breast ca dx'd 1988   Past Surgical History:  Procedure Laterality Date   BIOPSY  03/21/2020   Procedure: BIOPSY;  Surgeon: Thornton Park, MD;  Location: [facility];  Service: Gastroenterology;;   BIOPSY  09/13/2021   Procedure: BIOPSY;  Surgeon: Milus Banister, MD;  Location: [facility];  Service: Gastroenterology;;   BIOPSY  10/19/2021    Procedure: BIOPSY;  Surgeon: Thornton Park, MD;  Location: [facility];  Service: Gastroenterology;;   BREAST ENHANCEMENT SURGERY     COLONOSCOPY WITH PROPOFOL N/A 10/19/2021   Procedure: COLONOSCOPY WITH PROPOFOL;  Surgeon: Thornton Park, MD;  Location: [facility];  Service: Gastroenterology;  Laterality: N/A;   ENTEROSCOPY N/A 10/19/2021   Procedure: ENTEROSCOPY;  Surgeon: Thornton Park, MD;  Location: [facility];  Service: Gastroenterology;  Laterality: N/A;   ESOPHAGOGASTRODUODENOSCOPY N/A 10/16/2021   Procedure: ESOPHAGOGASTRODUODENOSCOPY (EGD);  Surgeon: Daryel November, MD;  Location: [facility];  Service: Gastroenterology;  Laterality: N/A;   ESOPHAGOGASTRODUODENOSCOPY (EGD) WITH PROPOFOL N/A 03/21/2020   Procedure: ESOPHAGOGASTRODUODENOSCOPY (EGD) WITH PROPOFOL;  Surgeon: Thornton Park, MD;  Location: [facility];  Service: Gastroenterology;  Laterality: N/A;   ESOPHAGOGASTRODUODENOSCOPY (EGD) WITH PROPOFOL N/A 09/13/2021   Procedure: ESOPHAGOGASTRODUODENOSCOPY (EGD) WITH PROPOFOL;  Surgeon: Milus Banister, MD;  Location: [facility];  Service: Gastroenterology;  Laterality: N/A;   MASTECTOMY     right   POLYPECTOMY  10/19/2021   Procedure: POLYPECTOMY;  Surgeon: Thornton Park, MD;  Location: [facility];  Service: Gastroenterology;;   TEMPOROMANDIBULAR JOINT SURGERY     Left   TONSILLECTOMY      reports that she has never smoked. She has never used smokeless tobacco. She reports that she does not drink  alcohol and does not use drugs. family history includes Asthma in her sister; COPD in her father; Cancer in her mother; Colon polyps in her father and mother; Dementia in her mother; Diabetes in an other family member; Hypertension in her mother and another family member. Allergies  Allergen Reactions   Amoxicillin-Pot Clavulanate Hives and Itching   Iron Anaphylaxis and Other (See Comments)    Patient develops hypotension, resp distress after infusion  of feraheme.    Penicillins Hives, Itching and Other (See Comments)   Codeine Nausea And Vomiting    Needs pre-meds     Review of Systems  Constitutional:  Positive for chills. Negative for fever.  HENT:  Negative for congestion.   Respiratory:  Positive for cough, sputum production, shortness of breath and wheezing. Negative for hemoptysis.   Cardiovascular:  Negative for chest pain.      Objective:     BP 108/80 (BP Location: Left Arm, Patient Position: Sitting, Cuff Size: Large)   Pulse (!) 114   Temp 98 F (36.7 C) (Oral)   Ht '5\' 8"'$  (1.727 m)   Wt 195 lb 6.4 oz (88.6 kg)   LMP 06/09/2017 (Exact Date)   SpO2 97%   BMI 29.71 kg/m  BP Readings from Last 3 Encounters:  02/22/22 108/80  02/14/22 114/75  02/01/22 133/82   Wt Readings from Last 3 Encounters:  02/22/22 195 lb 6.4 oz (88.6 kg)  02/14/22 192 lb (87.1 kg)  02/01/22 198 lb (89.8 kg)      Physical Exam Vitals reviewed.  Constitutional:      General: She is not in acute distress.    Appearance: She is not ill-appearing.  Cardiovascular:     Comments: Slightly tachycardic with heart rate around 104 but regular.  She states her baseline is usually around 100. Pulmonary:     Comments: She has a diffuse wheezing throughout upper and lower lung fields bilaterally.  No retractions.  No rales.  Pulse oximetry 97% room air. Neurological:     Mental Status: She is alert.      No results found for any visits on 02/22/22.  Last CBC Lab Results  Component Value Date   WBC 2.8 (L) 02/14/2022   HGB 12.5 02/14/2022   HCT 36.0 02/14/2022   MCV 101.4 (H) 02/14/2022   MCH 35.2 (H) 02/14/2022   RDW 16.1 (H) 02/14/2022   PLT 204 02/14/2022      The ASCVD Risk score (Arnett DK, et al., 2019) failed to calculate for the following reasons:   Cannot find a previous HDL lab   Cannot find a previous total cholesterol lab    Assessment & Plan:   Patient diagnosed with recent right middle lobe pneumonia in the  setting of metastatic breast cancer with chronic leukopenia.  She just finished 7-day course of Levaquin yesterday.  No fever.  No respiratory distress but does have some diffuse wheezes.  O2 sats stable.  Reviewed recent imaging through the ER along with labs.  -We did discuss steroids to try to help her wheezing and she was given Depo-Medrol 80 mg IM. -May increase albuterol MDI to every 6 hours as needed -Monitor pulse oximetry and be in touch if less than 90% -Follow-up immediately with [place] or oncology for any fever or worsening symptoms -She does have follow-up in 1 week with Lottie Dawson nurse practitioner with oncology and consider follow-up x-ray then   No follow-ups on file.    Carolann Littler, MD

## 2022-02-22 NOTE — Patient Instructions (Signed)
Watch for any fever or increased shortness of breath  May use the Albuterol up to every 6 hours as needed.

## 2022-02-24 ENCOUNTER — Telehealth: Payer: Self-pay | Admitting: Internal Medicine

## 2022-02-24 DIAGNOSIS — C50911 Malignant neoplasm of unspecified site of right female breast: Secondary | ICD-10-CM | POA: Diagnosis not present

## 2022-02-24 NOTE — Telephone Encounter (Signed)
Seen by [facility] 02/22/22 states she was advised to call back if symptoms didn't improve and provider would prescribe another medication. Patient states she has had no improvement

## 2022-02-25 MED ORDER — PREDNISONE 10 MG PO TABS: ORAL_TABLET | ORAL | 0 refills | Status: DC

## 2022-02-25 NOTE — Addendum Note (Signed)
Addended by: Nilda Riggs on: 02/25/2022 08:14 AM   Modules accepted: Orders

## 2022-02-25 NOTE — Addendum Note (Signed)
Addended by: Nilda Riggs on: 02/25/2022 07:56 AM   Modules accepted: Orders

## 2022-02-25 NOTE — Telephone Encounter (Signed)
Patient informed of message below- RX sent

## 2022-03-01 ENCOUNTER — Inpatient Hospital Stay: Payer: BC Managed Care – PPO | Attending: Hematology & Oncology

## 2022-03-01 ENCOUNTER — Encounter: Payer: Self-pay | Admitting: Medical Oncology

## 2022-03-01 ENCOUNTER — Inpatient Hospital Stay (HOSPITAL_BASED_OUTPATIENT_CLINIC_OR_DEPARTMENT_OTHER): Payer: BC Managed Care – PPO | Admitting: Medical Oncology

## 2022-03-01 ENCOUNTER — Inpatient Hospital Stay: Payer: BC Managed Care – PPO

## 2022-03-01 VITALS — BP 109/91 | HR 105 | Temp 97.4°F | Resp 17 | Wt 196.0 lb

## 2022-03-01 DIAGNOSIS — Z5111 Encounter for antineoplastic chemotherapy: Secondary | ICD-10-CM | POA: Insufficient documentation

## 2022-03-01 DIAGNOSIS — D509 Iron deficiency anemia, unspecified: Secondary | ICD-10-CM | POA: Diagnosis not present

## 2022-03-01 DIAGNOSIS — Z86008 Personal history of in-situ neoplasm of other site: Secondary | ICD-10-CM | POA: Diagnosis not present

## 2022-03-01 DIAGNOSIS — D500 Iron deficiency anemia secondary to blood loss (chronic): Secondary | ICD-10-CM | POA: Diagnosis not present

## 2022-03-01 DIAGNOSIS — C7951 Secondary malignant neoplasm of bone: Secondary | ICD-10-CM | POA: Insufficient documentation

## 2022-03-01 DIAGNOSIS — C50919 Malignant neoplasm of unspecified site of unspecified female breast: Secondary | ICD-10-CM

## 2022-03-01 DIAGNOSIS — D510 Vitamin B12 deficiency anemia due to intrinsic factor deficiency: Secondary | ICD-10-CM | POA: Diagnosis not present

## 2022-03-01 DIAGNOSIS — Z170 Estrogen receptor positive status [ER+]: Secondary | ICD-10-CM | POA: Insufficient documentation

## 2022-03-01 DIAGNOSIS — J189 Pneumonia, unspecified organism: Secondary | ICD-10-CM | POA: Diagnosis not present

## 2022-03-01 LAB — CBC WITH DIFFERENTIAL (CANCER CENTER ONLY)
Abs Immature Granulocytes: 0.22 10*3/uL — ABNORMAL HIGH (ref 0.00–0.07)
Basophils Absolute: 0.1 10*3/uL (ref 0.0–0.1)
Basophils Relative: 1 %
Eosinophils Absolute: 0.1 10*3/uL (ref 0.0–0.5)
Eosinophils Relative: 1 %
HCT: 36.4 % (ref 36.0–46.0)
Hemoglobin: 12.9 g/dL (ref 12.0–15.0)
Immature Granulocytes: 2 %
Lymphocytes Relative: 21 %
Lymphs Abs: 2.4 10*3/uL (ref 0.7–4.0)
MCH: 37.5 pg — ABNORMAL HIGH (ref 26.0–34.0)
MCHC: 35.4 g/dL (ref 30.0–36.0)
MCV: 105.8 fL — ABNORMAL HIGH (ref 80.0–100.0)
Monocytes Absolute: 0.8 10*3/uL (ref 0.1–1.0)
Monocytes Relative: 7 %
Neutro Abs: 7.6 10*3/uL (ref 1.7–7.7)
Neutrophils Relative %: 68 %
Platelet Count: 291 10*3/uL (ref 150–400)
RBC: 3.44 MIL/uL — ABNORMAL LOW (ref 3.87–5.11)
RDW: 14.9 % (ref 11.5–15.5)
WBC Count: 11.2 10*3/uL — ABNORMAL HIGH (ref 4.0–10.5)
nRBC: 0.4 % — ABNORMAL HIGH (ref 0.0–0.2)

## 2022-03-01 LAB — CMP (CANCER CENTER ONLY)
ALT: 18 U/L (ref 0–44)
AST: 12 U/L — ABNORMAL LOW (ref 15–41)
Albumin: 4.4 g/dL (ref 3.5–5.0)
Alkaline Phosphatase: 68 U/L (ref 38–126)
Anion gap: 9 (ref 5–15)
BUN: 19 mg/dL (ref 6–20)
CO2: 29 mmol/L (ref 22–32)
Calcium: 10 mg/dL (ref 8.9–10.3)
Chloride: 105 mmol/L (ref 98–111)
Creatinine: 0.89 mg/dL (ref 0.44–1.00)
GFR, Estimated: >60 mL/min (ref >60)
Glucose, Bld: 125 mg/dL — ABNORMAL HIGH (ref 70–99)
Potassium: 3.3 mmol/L — ABNORMAL LOW (ref 3.5–5.1)
Sodium: 143 mmol/L (ref 135–145)
Total Bilirubin: 0.5 mg/dL (ref 0.3–1.2)
Total Protein: 7.1 g/dL (ref 6.5–8.1)

## 2022-03-01 LAB — IRON AND IRON BINDING CAPACITY (CC-WL,HP ONLY)
Iron: 86 ug/dL (ref 28–170)
Saturation Ratios: 21 % (ref 10.4–31.8)
TIBC: 409 ug/dL (ref 250–450)
UIBC: 323 ug/dL (ref 148–442)

## 2022-03-01 LAB — FERRITIN: Ferritin: 14 ng/mL (ref 11–307)

## 2022-03-01 MED ORDER — FULVESTRANT 250 MG/5ML IM SOSY
500.0000 mg | PREFILLED_SYRINGE | INTRAMUSCULAR | Status: DC
  Filled 2022-03-01: qty 10

## 2022-03-01 MED ORDER — FULVESTRANT 250 MG/5ML IM SOSY
500.0000 mg | PREFILLED_SYRINGE | INTRAMUSCULAR | Status: DC
  Administered 2022-03-01: 500 mg via INTRAMUSCULAR

## 2022-03-01 MED ORDER — DOXYCYCLINE HYCLATE 100 MG PO TABS: 100.0000 mg | ORAL_TABLET | Freq: Two times a day (BID) | ORAL | 0 refills | Status: AC

## 2022-03-01 MED ORDER — AZITHROMYCIN 250 MG PO TABS: ORAL_TABLET | ORAL | 0 refills | Status: DC

## 2022-03-01 NOTE — Patient Instructions (Signed)

## 2022-03-01 NOTE — Progress Notes (Signed)
Hematology and Oncology Follow Up Visit  Caitlin Greene [id number] 04-Jul-1965 56 y.o. 03/01/2022   Principle Diagnosis:  Metastatic breast cancer-ER positive/HER-2 negative --bone metastasis only Iron deficiency anemia  Current Therapy:   Faslodex 500 mg IM monthly --start on 04/2020 Ibrance 100 mg p.o. daily (21d on/7d off) - start on 04/2020 Xgeva 120 mg subcu every 3 months -next dose 12//2023 IV iron-Feraheme given on 09/14/2021     Interim History:  Caitlin Greene is in for follow-up.   She reports that for the past 2.5 weeks she has had pneumonia. Seen on CT. Originally placed on levaquin which did not help much. She was then given a steroid IM from her PCP and then was started on prednisone on 02/25/2022. She reports that the steroids have helped some but she is still having some SOB,green sputum. No fevers. No chest pain. Hospital in July for upper GI bleed.  In terms of her breast cancer she denies any troubles with her medications. Last Leslee Home has been to hold due to illness  Medications:  Current Outpatient Medications:    acetaminophen (TYLENOL) 650 MG CR tablet, Take 650 mg by mouth every 8 (eight) hours as needed for pain., Disp: , Rfl:    azithromycin (ZITHROMAX Z-PAK) 250 MG tablet, Take 2 tablets together with food on day 1. Then take one tablet daily for days 2-5. Do not take with zofran., Disp: 6 tablet, Rfl: 0   calcium carbonate (OS-CAL) 600 MG tablet, Take 1 tablet (600 mg total) by mouth 2 (two) times daily., Disp: 30 tablet, Rfl: 0   doxycycline (VIBRA-TABS) 100 MG tablet, Take 1 tablet (100 mg total) by mouth 2 (two) times daily for 10 days., Disp: 20 tablet, Rfl: 0   Ferrous Sulfate (IRON) 325 (65 Fe) MG TABS, Take 1 tablet (325 mg total) by mouth daily at 2 PM., Disp: 30 tablet, Rfl: 0   FLUoxetine (PROZAC) 20 MG capsule, TAKE 3 CAPSULES(60 MG) BY MOUTH DAILY (Patient taking differently: 60 mg daily.), Disp: 270 capsule, Rfl: 3   fulvestrant (FASLODEX) 250 MG/5ML  injection, Inject 500 mg into the muscle every 30 (thirty) days. One injection each buttock over 1-2 minutes. Warm prior to use., Disp: , Rfl:    hydrOXYzine (ATARAX/VISTARIL) 25 MG tablet, Take 25 mg by mouth 3 (three) times daily as needed for anxiety., Disp: , Rfl:    IBRANCE 100 MG tablet, TAKE 1 TABLET DAILY ON DAY 1 THROUGH 21 OF CYCLE, 7 DAYS OFF. REPEAT, Disp: 21 tablet, Rfl: 0   ondansetron (ZOFRAN) 4 MG tablet, Take 1 tablet (4 mg total) by mouth every 4 (four) hours as needed., Disp: 20 tablet, Rfl: 0   oxyCODONE (OXY IR/ROXICODONE) 5 MG immediate release tablet, Take 1 tablet (5 mg total) by mouth every 8 (eight) hours as needed for severe pain., Disp: 50 tablet, Rfl: 0   pantoprazole (PROTONIX) 40 MG tablet, Take 1 tablet (40 mg total) by mouth 2 (two) times daily., Disp: 60 tablet, Rfl: 2   predniSONE (DELTASONE) 10 MG tablet, Use as directed and Taper: 4-4-3-3-2-2-1-1, Disp: 20 tablet, Rfl: 0   vitamin B-12 (CYANOCOBALAMIN) 1000 MCG tablet, Take 1 tablet (1,000 mcg total) by mouth daily., Disp: 30 tablet, Rfl: 0   Wheat Dextrin (BENEFIBER DRINK MIX PO), Take by mouth daily at 6 (six) AM., Disp: , Rfl:    polyethylene glycol (MIRALAX / GLYCOLAX) 17 g packet, Take 17 g by mouth daily as needed. (Patient not taking: Reported on 01/04/2022), Disp: 28  each, Rfl: 0  Current Facility-Administered Medications:    fulvestrant (FASLODEX) injection 500 mg, 500 mg, Intramuscular, Q30 days, Ennever, Rudell Cobb, MD  Facility-Administered Medications Ordered in Other Visits:    fulvestrant (FASLODEX) injection 500 mg, 500 mg, Intramuscular, Q30 days, Ennever, Rudell Cobb, MD  Allergies:  Allergies  Allergen Reactions   Amoxicillin-Pot Clavulanate Hives and Itching   Iron Anaphylaxis and Other (See Comments)    Patient develops hypotension, resp distress after infusion of feraheme.    Penicillins Hives, Itching and Other (See Comments)   Codeine Nausea And Vomiting    Needs pre-meds     Past  Medical History, Surgical history, Social history, and Family History were reviewed and updated.  Review of Systems: Review of Systems  Constitutional:  Positive for appetite change.  HENT:  Negative.    Eyes: Negative.   Respiratory:  Positive for cough.   Cardiovascular: Negative.   Gastrointestinal:  Positive for abdominal pain and nausea.  Endocrine: Negative.   Genitourinary: Negative.    Musculoskeletal:  Positive for arthralgias and back pain.  Skin: Negative.   Neurological:  Positive for dizziness.  Hematological: Negative.   Psychiatric/Behavioral: Negative.      Physical Exam:  weight is 196 lb 0.6 oz (88.9 kg). Her oral temperature is 97.4 F (36.3 C) (abnormal). Her blood pressure is 109/91 (abnormal) and her pulse is 105 (abnormal). Her respiration is 17 and oxygen saturation is 97%.   Wt Readings from Last 3 Encounters:  03/01/22 196 lb 0.6 oz (88.9 kg)  02/22/22 195 lb 6.4 oz (88.6 kg)  02/14/22 192 lb (87.1 kg)    Physical Exam Vitals reviewed.  HENT:     Head: Normocephalic and atraumatic.  Eyes:     Pupils: Pupils are equal, round, and reactive to light.  Cardiovascular:     Rate and Rhythm: Normal rate and regular rhythm.     Heart sounds: Normal heart sounds.  Pulmonary:     Effort: Pulmonary effort is normal.     Breath sounds: Normal breath sounds.  Abdominal:     General: Bowel sounds are normal.     Palpations: Abdomen is soft.  Musculoskeletal:        General: No tenderness or deformity. Normal range of motion.     Cervical back: Normal range of motion.  Lymphadenopathy:     Cervical: No cervical adenopathy.  Skin:    General: Skin is warm and dry.     Findings: No erythema or rash.  Neurological:     Mental Status: She is alert and oriented to person, place, and time.  Psychiatric:        Behavior: Behavior normal.        Thought Content: Thought content normal.        Judgment: Judgment normal.      Lab Results  Component  Value Date   WBC 11.2 (H) 03/01/2022   HGB 12.9 03/01/2022   HCT 36.4 03/01/2022   MCV 105.8 (H) 03/01/2022   PLT 291 03/01/2022     Chemistry      Component Value Date/Time   NA 143 03/01/2022 1316   K 3.3 (L) 03/01/2022 1316   CL 105 03/01/2022 1316   CO2 29 03/01/2022 1316   BUN 19 03/01/2022 1316   CREATININE 0.89 03/01/2022 1316   CREATININE 0.88 10/22/2019 1246      Component Value Date/Time   CALCIUM 10.0 03/01/2022 1316   ALKPHOS 68 03/01/2022 1316   AST 12 (  L) 03/01/2022 1316   ALT 18 03/01/2022 1316   BILITOT 0.5 03/01/2022 1316      Impression and Plan: Ms. Kucharski is a very charming 56 year old postmenopausal female with metastatic breast cancer.  We actually had seen her many years ago with a carcinoma in situ.  Today she continues to be suffering from her pneumonia symptoms. Afebrile. Labs/vitals suggestive of current prednisone use. O2 is stable. Does not appear septic. Covering with doxycyline and azithromycin. Discussed common potential side effects, Prolonged QT risk as well as C. Diff risk. She will not take Zofran with her abx therapies. Recent EKG on 02/14/2022 did not show any QT prolongation. She will stay hydrated and alert [place] on Friday if she is not feeling 50% better or sooner should symptoms worsen. Plan will be for her to restart her Ibrance on Monday as long as she is significantly improved and is without a fever.   Disposition: Today - She will get her Faslodex Sending in Doxycyline, Azithromycin.  RTC 1 month MD, labs, Delton See, Faslodex

## 2022-03-02 LAB — CANCER ANTIGEN 27.29: CA 27.29: 64.4 U/mL — ABNORMAL HIGH (ref 0.0–38.6)

## 2022-03-09 ENCOUNTER — Other Ambulatory Visit: Payer: Self-pay | Admitting: Family

## 2022-03-09 DIAGNOSIS — C50919 Malignant neoplasm of unspecified site of unspecified female breast: Secondary | ICD-10-CM

## 2022-03-09 DIAGNOSIS — J189 Pneumonia, unspecified organism: Secondary | ICD-10-CM

## 2022-03-10 ENCOUNTER — Ambulatory Visit (HOSPITAL_BASED_OUTPATIENT_CLINIC_OR_DEPARTMENT_OTHER)
Admission: RE | Admit: 2022-03-10 | Discharge: 2022-03-10 | Disposition: A | Discharge status: Home / Self Care | Payer: BC Managed Care – PPO | Source: Ambulatory Visit | Attending: Family | Admitting: Family

## 2022-03-10 DIAGNOSIS — C50919 Malignant neoplasm of unspecified site of unspecified female breast: Secondary | ICD-10-CM | POA: Insufficient documentation

## 2022-03-10 DIAGNOSIS — C50911 Malignant neoplasm of unspecified site of right female breast: Secondary | ICD-10-CM | POA: Diagnosis not present

## 2022-03-10 DIAGNOSIS — K449 Diaphragmatic hernia without obstruction or gangrene: Secondary | ICD-10-CM | POA: Diagnosis not present

## 2022-03-10 DIAGNOSIS — J189 Pneumonia, unspecified organism: Secondary | ICD-10-CM | POA: Insufficient documentation

## 2022-03-14 ENCOUNTER — Other Ambulatory Visit: Payer: Self-pay | Admitting: Hematology & Oncology

## 2022-03-14 ENCOUNTER — Telehealth: Payer: Self-pay | Admitting: *Deleted

## 2022-03-14 ENCOUNTER — Ambulatory Visit (INDEPENDENT_AMBULATORY_CARE_PROVIDER_SITE_OTHER): Payer: BC Managed Care – PPO | Admitting: Physician Assistant

## 2022-03-14 ENCOUNTER — Encounter: Payer: Self-pay | Admitting: Physician Assistant

## 2022-03-14 VITALS — BP 120/78 | HR 94 | Ht 68.0 in | Wt 196.2 lb

## 2022-03-14 DIAGNOSIS — D509 Iron deficiency anemia, unspecified: Secondary | ICD-10-CM | POA: Diagnosis not present

## 2022-03-14 DIAGNOSIS — K449 Diaphragmatic hernia without obstruction or gangrene: Secondary | ICD-10-CM | POA: Diagnosis not present

## 2022-03-14 NOTE — Progress Notes (Signed)
Chief Complaint: Follow-up hiatal hernia and Cameron's erosions and history of GI bleed/anemia  Review of pertinent gastrointestinal problems: 1.  Large hiatal hernia with classic appearing Cameron erosions causing GI bleeding, melena.  This was noted by EGD 09/2021 Dr. Ardis Hughs.  Also EGD 10/2021 Dr. Candis Schatz.  Also upper enteroscopy 10/2021 Dr. Tarri Glenn. 2.  10 cm segment of ischemic colitis (likely from her anaphylactic reaction to IV iron), colonoscopy 10/2021 while she was hospitalized with GI bleeding.  Mild colitis endoscopically, biopsies consistent with ischemic damage. 3.  IV iron allergy/reaction 10/2021: Hypotension respiratory distress, anaphylaxis 4.  Adenomatous colon polyp.  Colonoscopy 10/2021 while admitted in the hospital, see above, 1 mm tubular adenoma was removed.  Recall colonoscopy should be 7 to 10 years.  HPI:    Caitlin Greene is a 56 year old Caucasian female with a past medical history as listed below including breast cancer and hiatal hernia with Lysbeth Galas erosions, known to Dr. Ardis Hughs, who presents to clinic today for follow-up of her hiatal hernia.    10/26/2021 office visit with Dr. Ardis Hughs to follow-up on recent GI bleed.  That time discussed this is most likely from [facility] ulcerations on a hiatal hernia.  She was continued on her PPI and told to follow-up in 3 months.    03/01/2022 patient followed with oncology and at that point they discussed some ongoing pneumonia.  Labs that day showed a normal hemoglobin at 12.9, elevated white count 11.2 (on steroids), normal CMP.  Ferritin of 14 and iron studies normal.    Today, the patient tells me she is doing well on the Pantoprazole 40 mg twice a day.  She has had no further GI bleed and has no abdominal pain, heartburn or reflux.  She recently fought off some pneumonia and asked if it could be related to her hiatal hernia but otherwise is doing well.    Denies fever, chills, weight loss, blood in her stool, abdominal pain or  symptoms that awaken her from sleep.     Past Medical History:  Diagnosis Date   ALLERGIC RHINITIS    ANEMIA-IRON DEFICIENCY    ANXIETY    BREAST CANCER, HX OF 01/21/2007   at 56yo   DEPRESSION    GERD    HYPERLIPIDEMIA    Metastatic cancer to bone ([place]) dx'd 10/24/2019   recurrent breast ca   RENAL CALCULUS    rt breast ca dx'd 1988    Past Surgical History:  Procedure Laterality Date   BIOPSY  03/21/2020   Procedure: BIOPSY;  Surgeon: Thornton Park, MD;  Location: [facility];  Service: Gastroenterology;;   BIOPSY  09/13/2021   Procedure: BIOPSY;  Surgeon: Milus Banister, MD;  Location: [facility];  Service: Gastroenterology;;   BIOPSY  10/19/2021   Procedure: BIOPSY;  Surgeon: Thornton Park, MD;  Location: [facility];  Service: Gastroenterology;;   BREAST ENHANCEMENT SURGERY     COLONOSCOPY WITH PROPOFOL N/A 10/19/2021   Procedure: COLONOSCOPY WITH PROPOFOL;  Surgeon: Thornton Park, MD;  Location: [facility];  Service: Gastroenterology;  Laterality: N/A;   ENTEROSCOPY N/A 10/19/2021   Procedure: ENTEROSCOPY;  Surgeon: Thornton Park, MD;  Location: [facility];  Service: Gastroenterology;  Laterality: N/A;   ESOPHAGOGASTRODUODENOSCOPY N/A 10/16/2021   Procedure: ESOPHAGOGASTRODUODENOSCOPY (EGD);  Surgeon: Daryel November, MD;  Location: [facility];  Service: Gastroenterology;  Laterality: N/A;   ESOPHAGOGASTRODUODENOSCOPY (EGD) WITH PROPOFOL N/A 03/21/2020   Procedure: ESOPHAGOGASTRODUODENOSCOPY (EGD) WITH PROPOFOL;  Surgeon: Thornton Park, MD;  Location: [facility];  Service:  Gastroenterology;  Laterality: N/A;   ESOPHAGOGASTRODUODENOSCOPY (EGD) WITH PROPOFOL N/A 09/13/2021   Procedure: ESOPHAGOGASTRODUODENOSCOPY (EGD) WITH PROPOFOL;  Surgeon: Milus Banister, MD;  Location: [facility];  Service: Gastroenterology;  Laterality: N/A;   MASTECTOMY     right   POLYPECTOMY  10/19/2021   Procedure: POLYPECTOMY;  Surgeon: Thornton Park, MD;   Location: [facility];  Service: Gastroenterology;;   TEMPOROMANDIBULAR JOINT SURGERY     Left   TONSILLECTOMY      Current Outpatient Medications  Medication Sig Dispense Refill   acetaminophen (TYLENOL) 650 MG CR tablet Take 650 mg by mouth every 8 (eight) hours as needed for pain.     azithromycin (ZITHROMAX Z-PAK) 250 MG tablet Take 2 tablets together with food on day 1. Then take one tablet daily for days 2-5. Do not take with zofran. 6 tablet 0   calcium carbonate (OS-CAL) 600 MG tablet Take 1 tablet (600 mg total) by mouth 2 (two) times daily. 30 tablet 0   Ferrous Sulfate (IRON) 325 (65 Fe) MG TABS Take 1 tablet (325 mg total) by mouth daily at 2 PM. 30 tablet 0   FLUoxetine (PROZAC) 20 MG capsule TAKE 3 CAPSULES(60 MG) BY MOUTH DAILY (Patient taking differently: 60 mg daily.) 270 capsule 3   fulvestrant (FASLODEX) 250 MG/5ML injection Inject 500 mg into the muscle every 30 (thirty) days. One injection each buttock over 1-2 minutes. Warm prior to use.     hydrOXYzine (ATARAX/VISTARIL) 25 MG tablet Take 25 mg by mouth 3 (three) times daily as needed for anxiety.     IBRANCE 100 MG tablet TAKE 1 TABLET DAILY ON DAY 1 THROUGH 21 OF CYCLE, 7 DAYS OFF. REPEAT 21 tablet 0   ondansetron (ZOFRAN) 4 MG tablet Take 1 tablet (4 mg total) by mouth every 4 (four) hours as needed. 20 tablet 0   oxyCODONE (OXY IR/ROXICODONE) 5 MG immediate release tablet Take 1 tablet (5 mg total) by mouth every 8 (eight) hours as needed for severe pain. 50 tablet 0   pantoprazole (PROTONIX) 40 MG tablet Take 1 tablet (40 mg total) by mouth 2 (two) times daily. 60 tablet 2   polyethylene glycol (MIRALAX / GLYCOLAX) 17 g packet Take 17 g by mouth daily as needed. (Patient not taking: Reported on 01/04/2022) 28 each 0   predniSONE (DELTASONE) 10 MG tablet Use as directed and Taper: 4-4-3-3-2-2-1-1 20 tablet 0   vitamin B-12 (CYANOCOBALAMIN) 1000 MCG tablet Take 1 tablet (1,000 mcg total) by mouth daily. 30 tablet 0    Wheat Dextrin (BENEFIBER DRINK MIX PO) Take by mouth daily at 6 (six) AM.     No current facility-administered medications for this visit.    Allergies as of 03/14/2022 - Review Complete 03/01/2022  Allergen Reaction Noted   Amoxicillin-pot clavulanate Hives and Itching 11/13/2010   Iron Anaphylaxis and Other (See Comments) 10/20/2021   Penicillins Hives, Itching, and Other (See Comments) 01/21/2007   Codeine Nausea And Vomiting 10/04/2017    Family History  Problem Relation Age of Onset   Colon polyps Mother    Cancer Mother        Uterine Cancer   Hypertension Mother    Dementia Mother    Colon polyps Father    COPD Father        smoked   Hypertension Other    Diabetes Other    Asthma Sister     Social History   Socioeconomic History   Marital status: Widowed  Spouse name: Not on file   Number of children: 0   Years of education: Not on file   Highest education level: Not on file  Occupational History   Occupation: BANKRUPTCY ANALYST    Employer: [employer]  Tobacco Use   Smoking status: Never   Smokeless tobacco: Never  Vaping Use   Vaping Use: Never used  Substance and Sexual Activity   Alcohol use: No    Alcohol/week: 0.0 standard drinks of alcohol   Drug use: No   Sexual activity: Not on file  Other Topics Concern   Not on file  Social History Narrative   Not on file   Social Determinants of Health   Financial Resource Strain: Not on file  Food Insecurity: Not on file  Transportation Needs: Not on file  Physical Activity: Not on file  Stress: Not on file  Social Connections: Not on file  Intimate Partner Violence: Not on file    Review of Systems:    Constitutional: No weight loss, fever or chills Cardiovascular: No chest pain Respiratory: No SOB  Gastrointestinal: See HPI and otherwise negative   Physical Exam:  Vital signs: BP 120/78   Pulse 94   Ht '5\' 8"'$  (1.727 m)   Wt 196 lb 3.2 oz (89 kg)   LMP 06/09/2017 (Exact Date)    SpO2 97%   BMI 29.83 kg/m    Constitutional:   Pleasant Caucasian female appears to be in NAD, Well developed, Well nourished, alert and cooperative Respiratory: Respirations even and unlabored. Lungs clear to auscultation bilaterally.   No wheezes, crackles, or rhonchi.  Cardiovascular: Normal S1, S2. No MRG. Regular rate and rhythm. No peripheral edema, cyanosis or pallor.  Gastrointestinal:  Soft, nondistended, nontender. No rebound or guarding. Normal bowel sounds. No appreciable masses or hepatomegaly. Rectal:  Not performed.  Psychiatric: Oriented to person, place and time. Demonstrates good judgement and reason without abnormal affect or behaviors.  RELEVANT LABS AND IMAGING: CBC    Component Value Date/Time   WBC 11.2 (H) 03/01/2022 1316   WBC 2.8 (L) 02/14/2022 1418   RBC 3.44 (L) 03/01/2022 1316   HGB 12.9 03/01/2022 1316   HCT 36.4 03/01/2022 1316   PLT 291 03/01/2022 1316   MCV 105.8 (H) 03/01/2022 1316   MCH 37.5 (H) 03/01/2022 1316   MCHC 35.4 03/01/2022 1316   RDW 14.9 03/01/2022 1316   LYMPHSABS 2.4 03/01/2022 1316   MONOABS 0.8 03/01/2022 1316   EOSABS 0.1 03/01/2022 1316   BASOSABS 0.1 03/01/2022 1316    CMP     Component Value Date/Time   NA 143 03/01/2022 1316   K 3.3 (L) 03/01/2022 1316   CL 105 03/01/2022 1316   CO2 29 03/01/2022 1316   GLUCOSE 125 (H) 03/01/2022 1316   BUN 19 03/01/2022 1316   CREATININE 0.89 03/01/2022 1316   CREATININE 0.88 10/22/2019 1246   CALCIUM 10.0 03/01/2022 1316   PROT 7.1 03/01/2022 1316   ALBUMIN 4.4 03/01/2022 1316   AST 12 (L) 03/01/2022 1316   ALT 18 03/01/2022 1316   ALKPHOS 68 03/01/2022 1316   BILITOT 0.5 03/01/2022 1316   GFRNONAA >60 03/01/2022 1316   GFRNONAA 74 10/22/2019 1246   GFRAA 86 10/22/2019 1246    Assessment: 1.  History of GI bleed: From hiatal hernia and Cameron erosions, none over the past 6 months, doing well on Pantoprazole 40 twice daily 2. Anemia: with above  Plan: 1.  Patient will  continue to follow with  the cancer center in regards to her anemia and any iron or blood transfusions that she needs.  If she does have drastic drop in hemoglobin or any other GI bleed will need to consider hiatal hernia repair as per Dr. Ardis Hughs notes. 2.  For now continue Pantoprazole 40 mg twice daily. 3.  Note was sent to Dr. Loletha Carrow this afternoon in Dr. Ardis Hughs absence.  Caitlin Newer, PA-C [facility] 03/14/2022, 3:18 PM  Cc: Biagio Borg, MD

## 2022-03-14 NOTE — Patient Instructions (Signed)
Continue Pantoprazole 40 mg twice daily.  Please call with any changes in your health.  Sincerely, Ellouise Newer, PA-C

## 2022-03-14 NOTE — Telephone Encounter (Signed)
Called patient after Dr Marin Olp reviewed latest CXR. Assessed patient symptoms and relayed to Dr Marin Olp who states that we dont have to do anything at this point, as it seems to be getting better.  Reminded patient to let us know if symptoms get worse.  Patient in agreement and will let us know if symptoms worsen.

## 2022-03-14 NOTE — Progress Notes (Signed)
____________________________________________________________  Attending physician addendum:  Thank you for sending this case to me. I have reviewed the entire note and agree with the plan.  Regardless of the anemia stability with hematology follow-up and treatments, she could also reasonably consider hiatal hernia repair if she does not wish to take long-term acid suppression.  If she would like to consult with a surgeon about that at some point, I recommend Dr. Benedetto Coons of thoracic surgery.  Wilfrid Lund, MD  ____________________________________________________________

## 2022-03-28 ENCOUNTER — Inpatient Hospital Stay (HOSPITAL_BASED_OUTPATIENT_CLINIC_OR_DEPARTMENT_OTHER): Payer: BC Managed Care – PPO | Admitting: Hematology & Oncology

## 2022-03-28 ENCOUNTER — Inpatient Hospital Stay: Payer: BC Managed Care – PPO

## 2022-03-28 ENCOUNTER — Encounter: Payer: Self-pay | Admitting: Hematology & Oncology

## 2022-03-28 ENCOUNTER — Other Ambulatory Visit: Payer: Self-pay

## 2022-03-28 ENCOUNTER — Inpatient Hospital Stay: Payer: BC Managed Care – PPO | Attending: Hematology & Oncology

## 2022-03-28 VITALS — BP 113/84 | HR 82 | Temp 97.7°F | Resp 18 | Ht 68.0 in | Wt 199.0 lb

## 2022-03-28 DIAGNOSIS — Z170 Estrogen receptor positive status [ER+]: Secondary | ICD-10-CM | POA: Diagnosis not present

## 2022-03-28 DIAGNOSIS — Z23 Encounter for immunization: Secondary | ICD-10-CM | POA: Insufficient documentation

## 2022-03-28 DIAGNOSIS — C50919 Malignant neoplasm of unspecified site of unspecified female breast: Secondary | ICD-10-CM

## 2022-03-28 DIAGNOSIS — Z79899 Other long term (current) drug therapy: Secondary | ICD-10-CM | POA: Insufficient documentation

## 2022-03-28 DIAGNOSIS — Z79818 Long term (current) use of other agents affecting estrogen receptors and estrogen levels: Secondary | ICD-10-CM | POA: Diagnosis not present

## 2022-03-28 DIAGNOSIS — C7951 Secondary malignant neoplasm of bone: Secondary | ICD-10-CM | POA: Diagnosis not present

## 2022-03-28 DIAGNOSIS — R918 Other nonspecific abnormal finding of lung field: Secondary | ICD-10-CM | POA: Insufficient documentation

## 2022-03-28 DIAGNOSIS — C50011 Malignant neoplasm of nipple and areola, right female breast: Secondary | ICD-10-CM

## 2022-03-28 DIAGNOSIS — D510 Vitamin B12 deficiency anemia due to intrinsic factor deficiency: Secondary | ICD-10-CM

## 2022-03-28 DIAGNOSIS — D509 Iron deficiency anemia, unspecified: Secondary | ICD-10-CM | POA: Insufficient documentation

## 2022-03-28 DIAGNOSIS — D500 Iron deficiency anemia secondary to blood loss (chronic): Secondary | ICD-10-CM

## 2022-03-28 DIAGNOSIS — Z09 Encounter for follow-up examination after completed treatment for conditions other than malignant neoplasm: Secondary | ICD-10-CM

## 2022-03-28 LAB — CMP (CANCER CENTER ONLY)
ALT: 13 U/L (ref 0–44)
AST: 12 U/L — ABNORMAL LOW (ref 15–41)
Albumin: 4.3 g/dL (ref 3.5–5.0)
Alkaline Phosphatase: 53 U/L (ref 38–126)
Anion gap: 7 (ref 5–15)
BUN: 14 mg/dL (ref 6–20)
CO2: 28 mmol/L (ref 22–32)
Calcium: 9 mg/dL (ref 8.9–10.3)
Chloride: 105 mmol/L (ref 98–111)
Creatinine: 0.95 mg/dL (ref 0.44–1.00)
GFR, Estimated: >60 mL/min (ref >60)
Glucose, Bld: 92 mg/dL (ref 70–99)
Potassium: 4 mmol/L (ref 3.5–5.1)
Sodium: 140 mmol/L (ref 135–145)
Total Bilirubin: 0.5 mg/dL (ref 0.3–1.2)
Total Protein: 6.9 g/dL (ref 6.5–8.1)

## 2022-03-28 LAB — CBC WITH DIFFERENTIAL/PLATELET
Abs Immature Granulocytes: 0.01 10*3/uL (ref 0.00–0.07)
Basophils Absolute: 0 10*3/uL (ref 0.0–0.1)
Basophils Relative: 0 %
Eosinophils Absolute: 0.1 10*3/uL (ref 0.0–0.5)
Eosinophils Relative: 2 %
HCT: 36.1 % (ref 36.0–46.0)
Hemoglobin: 12.2 g/dL (ref 12.0–15.0)
Immature Granulocytes: 0 %
Lymphocytes Relative: 42 %
Lymphs Abs: 1 10*3/uL (ref 0.7–4.0)
MCH: 34.6 pg — ABNORMAL HIGH (ref 26.0–34.0)
MCHC: 33.8 g/dL (ref 30.0–36.0)
MCV: 102.3 fL — ABNORMAL HIGH (ref 80.0–100.0)
Monocytes Absolute: 0.2 10*3/uL (ref 0.1–1.0)
Monocytes Relative: 7 %
Neutro Abs: 1.1 10*3/uL — ABNORMAL LOW (ref 1.7–7.7)
Neutrophils Relative %: 49 %
Platelets: 142 10*3/uL — ABNORMAL LOW (ref 150–400)
RBC: 3.53 MIL/uL — ABNORMAL LOW (ref 3.87–5.11)
RDW: 14.8 % (ref 11.5–15.5)
Smear Review: NORMAL
WBC: 2.3 10*3/uL — ABNORMAL LOW (ref 4.0–10.5)
nRBC: 0 % (ref 0.0–0.2)

## 2022-03-28 LAB — IRON AND IRON BINDING CAPACITY (CC-WL,HP ONLY)
Iron: 108 ug/dL (ref 28–170)
Saturation Ratios: 26 % (ref 10.4–31.8)
TIBC: 412 ug/dL (ref 250–450)
UIBC: 304 ug/dL (ref 148–442)

## 2022-03-28 LAB — FERRITIN: Ferritin: 14 ng/mL (ref 11–307)

## 2022-03-28 MED ORDER — FULVESTRANT 250 MG/5ML IM SOSY
500.0000 mg | PREFILLED_SYRINGE | INTRAMUSCULAR | Status: DC
  Administered 2022-03-28: 500 mg via INTRAMUSCULAR
  Filled 2022-03-28: qty 10

## 2022-03-28 MED ORDER — PNEUMOCOCCAL VAC POLYVALENT 25 MCG/0.5ML IJ INJ
0.5000 mL | INJECTION | Freq: Once | INTRAMUSCULAR | Status: AC
  Administered 2022-03-28: 0.5 mL via INTRAMUSCULAR
  Filled 2022-03-28: qty 0.5

## 2022-03-28 NOTE — Patient Instructions (Addendum)
Fulvestrant Injection What is this medication? FULVESTRANT (ful VES trant) treats breast cancer. It works by blocking the hormone estrogen in breast tissue, which prevents breast cancer cells from spreading or growing. This medicine may be used for other purposes; ask your health care provider or pharmacist if you have questions. COMMON BRAND NAME(S): FASLODEX What should I tell my care team before I take this medication? They need to know if you have any of these conditions: Bleeding disorder Liver disease Low blood cell levels, such as low white cells, red cells, and platelets An unusual or allergic reaction to fulvestrant, other medications, foods, dyes, or preservatives Pregnant or trying to get pregnant Breast-feeding How should I use this medication? This medication is injected into a muscle. It is given by your care team in a hospital or clinic setting. Talk to your care team about the use of this medication in children. Special care may be needed. Overdosage: If you think you have taken too much of this medicine contact a poison control center or emergency room at once. NOTE: This medicine is only for you. Do not share this medicine with others. What if I miss a dose? Keep appointments for follow-up doses. It is important not to miss your dose. Call your care team if you are unable to keep an appointment. What may interact with this medication? Certain medications that prevent or treat blood clots, such as warfarin, enoxaparin, dalteparin, apixaban, dabigatran, rivaroxaban This list may not describe all possible interactions. Give your health care provider a list of all the medicines, herbs, non-prescription drugs, or dietary supplements you use. Also tell them if you smoke, drink alcohol, or use illegal drugs. Some items may interact with your medicine. What should I watch for while using this medication? Your condition will be monitored carefully while you are receiving this  medication. You may need blood work while taking this medication. Talk to your care team if you may be pregnant. Serious birth defects can occur if you take this medication during pregnancy and for 1 year after the last dose. You will need a negative pregnancy test before starting this medication. Contraception is recommended while taking this medication and for 1 year after the last dose. Your care team can help you find the option that works for you. Do not breastfeed while taking this medication and for 1 year after the last dose. This medication may cause infertility. Talk to your care team if you are concerned about your fertility. What side effects may I notice from receiving this medication? Side effects that you should report to your care team as soon as possible: Allergic reactions or angioedema--skin rash, itching or hives, swelling of the face, eyes, lips, tongue, arms, or legs, trouble swallowing or breathing Pain, tingling, or numbness in the hands or feet Side effects that usually do not require medical attention (report to your care team if they continue or are bothersome): Bone, joint, or muscle pain Constipation Headache Hot flashes Nausea Pain, redness, or irritation at injection site Unusual weakness or fatigue This list may not describe all possible side effects. Call your doctor for medical advice about side effects. You may report side effects to FDA at 1-800-FDA-1088. Where should I keep my medication? This medication is given in a hospital or clinic. It will not be stored at home. NOTE: This sheet is a summary. It may not cover all possible information. If you have questions about this medicine, talk to your doctor, pharmacist, or health care provider.    2023 Elsevier/Gold Standard (2021-09-06 00:00:00) Pneumococcal Polysaccharide Vaccine (PPSV23): What You Need to Know 1. Why get vaccinated? Pneumococcal polysaccharide vaccine (PPSV23) can prevent pneumococcal  disease. Pneumococcal disease refers to any illness caused by pneumococcal bacteria. These bacteria can cause many types of illnesses, including pneumonia, which is an infection of the lungs. Pneumococcal bacteria are one of the most common causes of pneumonia. Besides pneumonia, pneumococcal bacteria can also cause: Ear infections Sinus infections Meningitis (infection of the tissue covering the brain and spinal cord) Bacteremia (bloodstream infection) Anyone can get pneumococcal disease, but children under 43 years of age, people with certain medical conditions, adults 71 years or older, and cigarette smokers are at the highest risk. Most pneumococcal infections are mild. However, some can result in long-term problems, such as brain damage or hearing loss. Meningitis, bacteremia, and pneumonia caused by pneumococcal disease can be fatal. 2. PPSV23 PPSV23 protects against 23 types of bacteria that cause pneumococcal disease. PPSV23 is recommended for: All adults 67 years or older, Anyone 2 years or older with certain medical conditions that can lead to an increased risk for pneumococcal disease. Most people need only one dose of PPSV23. A second dose of PPSV23, and another type of pneumococcal vaccine called PCV13, are recommended for certain high-risk groups. Your health care provider can give you more information. People 65 years or older should get a dose of PPSV23 even if they have already gotten one or more doses of the vaccine before they turned 53. 3. Talk with your health care provider Tell your vaccine provider if the person getting the vaccine: Has had an allergic reaction after a previous dose of PPSV23, or has any severe, life-threatening allergies. In some cases, your health care provider may decide to postpone PPSV23 vaccination to a future visit. People with minor illnesses, such as a cold, may be vaccinated. People who are moderately or severely ill should usually wait until they  recover before getting PPSV23. Your health care provider can give you more information. 4. Risks of a vaccine reaction Redness or pain where the shot is given, feeling tired, fever, or muscle aches can happen after PPSV23. People sometimes faint after medical procedures, including vaccination. Tell your provider if you feel dizzy or have vision changes or ringing in the ears. As with any medicine, there is a very remote chance of a vaccine causing a severe allergic reaction, other serious injury, or death. 5. What if there is a serious problem? An allergic reaction could occur after the vaccinated person leaves the clinic. If you see signs of a severe allergic reaction (hives, swelling of the face and throat, difficulty breathing, a fast heartbeat, dizziness, or weakness), call 9-1-1 and get the person to the nearest hospital. For other signs that concern you, call your health care provider. Adverse reactions should be reported to the Vaccine Adverse Event Reporting System (VAERS). Your health care provider will usually file this report, or you can do it yourself. Visit the VAERS website at www.vaers.SamedayNews.es or call 502-008-8768. VAERS is only for reporting reactions, and VAERS staff do not give medical advice. 6. How can I learn more? Ask your health care provider. Call your local or state health department. Contact the Centers for Disease Control and Prevention (CDC): Call 978 833 9283 (1-800-CDC-INFO) or Visit CDC's website at http://hunter.com/ Source: CDC Vaccine Information Statement PPSV23 Vaccine (03/07/2018) This same material is available at http://www.wolf.info/ for no charge. This information is not intended to replace advice given to you by your health care  provider. Make sure you discuss any questions you have with your health care provider. Document Revised: 03/23/2021 Document Reviewed: 01/25/2021 Elsevier Patient Education  [employer].

## 2022-03-28 NOTE — Progress Notes (Signed)
Hematology and Oncology Follow Up Visit  Caitlin Greene [id number] January 16, 1966 56 y.o. 03/28/2022   Principle Diagnosis:  Metastatic breast cancer-ER positive/HER-2 negative --bone metastasis only Iron deficiency anemia  Current Therapy:   Faslodex 500 mg IM monthly --start on 04/2020 Ibrance 100 mg p.o. daily (21d on/7d off) - start on 04/2020 Xgeva 120 mg subcu every 3 months -next dose 12//2023 IV iron-Feraheme given on 09/14/2021     Interim History:  Caitlin Greene is in for follow-up.  Her main problem right now is that she has had this cough.  She has had this cough for over a month.  She has had a CT angiogram that was done back in early October.  This was negative for any pulmonary embolism. She may have had infiltrate in the right middle lobe.  She has been on antibiotics.  She is off antibiotics now.  Her last CA 27.29 was little more elevated at 64.  As such, we will have to get her set up with a CT scan so we see how this looks.  She has had no fever.  She has had no bleeding.  She has had no change in bowel or bladder habits.  There is been no GI blood loss.  She has had no leg swelling.  She has had no rashes.  Overall, I would say performance status is probably ECOG 1.     Medications:  Current Outpatient Medications:    acetaminophen (TYLENOL) 650 MG CR tablet, Take 650 mg by mouth every 8 (eight) hours as needed for pain., Disp: , Rfl:    azithromycin (ZITHROMAX Z-PAK) 250 MG tablet, Take 2 tablets together with food on day 1. Then take one tablet daily for days 2-5. Do not take with zofran., Disp: 6 tablet, Rfl: 0   calcium carbonate (OS-CAL) 600 MG tablet, Take 1 tablet (600 mg total) by mouth 2 (two) times daily., Disp: 30 tablet, Rfl: 0   Ferrous Sulfate (IRON) 325 (65 Fe) MG TABS, Take 1 tablet (325 mg total) by mouth daily at 2 PM., Disp: 30 tablet, Rfl: 0   FLUoxetine (PROZAC) 20 MG capsule, TAKE 3 CAPSULES(60 MG) BY MOUTH DAILY (Patient taking differently: 60 mg  daily.), Disp: 270 capsule, Rfl: 3   fulvestrant (FASLODEX) 250 MG/5ML injection, Inject 500 mg into the muscle every 30 (thirty) days. One injection each buttock over 1-2 minutes. Warm prior to use., Disp: , Rfl:    hydrOXYzine (ATARAX/VISTARIL) 25 MG tablet, Take 25 mg by mouth 3 (three) times daily as needed for anxiety., Disp: , Rfl:    IBRANCE 100 MG tablet, TAKE 1 TABLET DAILY ON DAY 1 THROUGH 21 OF CYCLE, 7 DAYS OFF. REPEAT, Disp: 21 tablet, Rfl: 0   ondansetron (ZOFRAN) 4 MG tablet, Take 1 tablet (4 mg total) by mouth every 4 (four) hours as needed., Disp: 20 tablet, Rfl: 0   oxyCODONE (OXY IR/ROXICODONE) 5 MG immediate release tablet, Take 1 tablet (5 mg total) by mouth every 8 (eight) hours as needed for severe pain., Disp: 50 tablet, Rfl: 0   pantoprazole (PROTONIX) 40 MG tablet, Take 1 tablet (40 mg total) by mouth 2 (two) times daily., Disp: 60 tablet, Rfl: 2   polyethylene glycol (MIRALAX / GLYCOLAX) 17 g packet, Take 17 g by mouth daily as needed., Disp: 28 each, Rfl: 0   predniSONE (DELTASONE) 10 MG tablet, Use as directed and Taper: 4-4-3-3-2-2-1-1, Disp: 20 tablet, Rfl: 0   vitamin B-12 (CYANOCOBALAMIN) 1000 MCG tablet, Take 1  tablet (1,000 mcg total) by mouth daily., Disp: 30 tablet, Rfl: 0   Wheat Dextrin (BENEFIBER DRINK MIX PO), Take by mouth daily at 6 (six) AM., Disp: , Rfl:   Allergies:  Allergies  Allergen Reactions   Amoxicillin-Pot Clavulanate Hives and Itching   Iron Anaphylaxis and Other (See Comments)    Patient develops hypotension, resp distress after infusion of feraheme.    Penicillins Hives, Itching and Other (See Comments)   Codeine Nausea And Vomiting    Needs pre-meds     Past Medical History, Surgical history, Social history, and Family History were reviewed and updated.  Review of Systems: Review of Systems  Constitutional:  Positive for appetite change.  HENT:  Negative.    Eyes: Negative.   Respiratory: Negative.    Cardiovascular: Negative.    Gastrointestinal:  Positive for abdominal pain and nausea.  Endocrine: Negative.   Genitourinary: Negative.    Musculoskeletal:  Positive for arthralgias and back pain.  Skin: Negative.   Neurological:  Positive for dizziness.  Hematological: Negative.   Psychiatric/Behavioral: Negative.      Physical Exam:  vitals were not taken for this visit.   Wt Readings from Last 3 Encounters:  03/14/22 196 lb 3.2 oz (89 kg)  03/01/22 196 lb 0.6 oz (88.9 kg)  02/22/22 195 lb 6.4 oz (88.6 kg)    Physical Exam Vitals reviewed.  HENT:     Head: Normocephalic and atraumatic.  Eyes:     Pupils: Pupils are equal, round, and reactive to light.  Cardiovascular:     Rate and Rhythm: Normal rate and regular rhythm.     Heart sounds: Normal heart sounds.  Pulmonary:     Effort: Pulmonary effort is normal.     Breath sounds: Normal breath sounds.  Abdominal:     General: Bowel sounds are normal.     Palpations: Abdomen is soft.  Musculoskeletal:        General: No tenderness or deformity. Normal range of motion.     Cervical back: Normal range of motion.  Lymphadenopathy:     Cervical: No cervical adenopathy.  Skin:    General: Skin is warm and dry.     Findings: No erythema or rash.  Neurological:     Mental Status: She is alert and oriented to person, place, and time.  Psychiatric:        Behavior: Behavior normal.        Thought Content: Thought content normal.        Judgment: Judgment normal.      Lab Results  Component Value Date   WBC 2.3 (L) 03/28/2022   HGB 12.2 03/28/2022   HCT 36.1 03/28/2022   MCV 102.3 (H) 03/28/2022   PLT 142 (L) 03/28/2022     Chemistry      Component Value Date/Time   NA 143 03/01/2022 1316   K 3.3 (L) 03/01/2022 1316   CL 105 03/01/2022 1316   CO2 29 03/01/2022 1316   BUN 19 03/01/2022 1316   CREATININE 0.89 03/01/2022 1316   CREATININE 0.88 10/22/2019 1246      Component Value Date/Time   CALCIUM 10.0 03/01/2022 1316   ALKPHOS 68  03/01/2022 1316   AST 12 (L) 03/01/2022 1316   ALT 18 03/01/2022 1316   BILITOT 0.5 03/01/2022 1316      Impression and Plan: Caitlin Greene is a very charming 56 year old postmenopausal female with metastatic breast cancer.  We actually had seen her many years ago with  a carcinoma in situ.  Not sure what to make of this cough.  Her lungs sound good when I examined her.  She had the CT angiogram which did not show any obvious malignancy.  However, there is persistent right middle lobe infiltrate could certainly present as a malignancy.  She is due for CT scans.  We will set these up in a couple weeks.  The CA 27.29 will very interesting as to the level.  Overall, she is really done nicely.  We found her on hormonal therapy for several years.  I think that she initially presented with the metastatic disease probably about 2 years ago.  I will go ahead and plan to get her back in 1 month.  When she comes back, she will get her Xgeva.  She will get Faslodex today.

## 2022-03-29 LAB — CANCER ANTIGEN 27.29: CA 27.29: 40.3 U/mL — ABNORMAL HIGH (ref 0.0–38.6)

## 2022-04-08 ENCOUNTER — Other Ambulatory Visit: Payer: Self-pay | Admitting: Hematology & Oncology

## 2022-04-11 ENCOUNTER — Encounter: Payer: Self-pay | Admitting: Hematology & Oncology

## 2022-04-14 ENCOUNTER — Encounter: Payer: Self-pay | Admitting: Hematology & Oncology

## 2022-04-18 ENCOUNTER — Ambulatory Visit (HOSPITAL_BASED_OUTPATIENT_CLINIC_OR_DEPARTMENT_OTHER)
Admission: RE | Admit: 2022-04-18 | Discharge: 2022-04-18 | Disposition: A | Discharge status: Home / Self Care | Payer: Medicare HMO | Source: Ambulatory Visit | Attending: Hematology & Oncology | Admitting: Hematology & Oncology

## 2022-04-18 DIAGNOSIS — C50011 Malignant neoplasm of nipple and areola, right female breast: Secondary | ICD-10-CM | POA: Diagnosis not present

## 2022-04-18 DIAGNOSIS — R918 Other nonspecific abnormal finding of lung field: Secondary | ICD-10-CM | POA: Diagnosis not present

## 2022-04-18 DIAGNOSIS — K5730 Diverticulosis of large intestine without perforation or abscess without bleeding: Secondary | ICD-10-CM | POA: Diagnosis not present

## 2022-04-18 MED ORDER — IOHEXOL 300 MG/ML  SOLN
100.0000 mL | Freq: Once | INTRAMUSCULAR | Status: AC | PRN
  Administered 2022-04-18: 100 mL via INTRAVENOUS

## 2022-04-20 ENCOUNTER — Other Ambulatory Visit (HOSPITAL_BASED_OUTPATIENT_CLINIC_OR_DEPARTMENT_OTHER): Payer: BC Managed Care – PPO

## 2022-04-20 ENCOUNTER — Ambulatory Visit (HOSPITAL_BASED_OUTPATIENT_CLINIC_OR_DEPARTMENT_OTHER): Payer: BC Managed Care – PPO

## 2022-04-25 ENCOUNTER — Inpatient Hospital Stay: Payer: Medicare HMO

## 2022-04-25 ENCOUNTER — Other Ambulatory Visit: Payer: Self-pay

## 2022-04-25 ENCOUNTER — Encounter: Payer: Self-pay | Admitting: Hematology & Oncology

## 2022-04-25 ENCOUNTER — Inpatient Hospital Stay: Payer: Medicare HMO | Attending: Hematology & Oncology

## 2022-04-25 ENCOUNTER — Inpatient Hospital Stay (HOSPITAL_BASED_OUTPATIENT_CLINIC_OR_DEPARTMENT_OTHER): Payer: Medicare HMO | Admitting: Hematology & Oncology

## 2022-04-25 VITALS — BP 128/85 | HR 96 | Temp 98.2°F | Resp 16 | Ht 68.0 in | Wt 201.0 lb

## 2022-04-25 DIAGNOSIS — C50011 Malignant neoplasm of nipple and areola, right female breast: Secondary | ICD-10-CM | POA: Diagnosis present

## 2022-04-25 DIAGNOSIS — C7951 Secondary malignant neoplasm of bone: Secondary | ICD-10-CM | POA: Diagnosis not present

## 2022-04-25 DIAGNOSIS — Z170 Estrogen receptor positive status [ER+]: Secondary | ICD-10-CM | POA: Diagnosis not present

## 2022-04-25 DIAGNOSIS — C50919 Malignant neoplasm of unspecified site of unspecified female breast: Secondary | ICD-10-CM

## 2022-04-25 DIAGNOSIS — Z79818 Long term (current) use of other agents affecting estrogen receptors and estrogen levels: Secondary | ICD-10-CM | POA: Diagnosis not present

## 2022-04-25 DIAGNOSIS — D509 Iron deficiency anemia, unspecified: Secondary | ICD-10-CM | POA: Insufficient documentation

## 2022-04-25 DIAGNOSIS — Z79899 Other long term (current) drug therapy: Secondary | ICD-10-CM | POA: Diagnosis not present

## 2022-04-25 DIAGNOSIS — Z86008 Personal history of in-situ neoplasm of other site: Secondary | ICD-10-CM | POA: Diagnosis not present

## 2022-04-25 LAB — CMP (CANCER CENTER ONLY)
ALT: 15 U/L (ref 0–44)
AST: 13 U/L — ABNORMAL LOW (ref 15–41)
Albumin: 4.5 g/dL (ref 3.5–5.0)
Alkaline Phosphatase: 50 U/L (ref 38–126)
Anion gap: 7 (ref 5–15)
BUN: 13 mg/dL (ref 6–20)
CO2: 29 mmol/L (ref 22–32)
Calcium: 9.1 mg/dL (ref 8.9–10.3)
Chloride: 103 mmol/L (ref 98–111)
Creatinine: 1.01 mg/dL — ABNORMAL HIGH (ref 0.44–1.00)
GFR, Estimated: >60 mL/min (ref >60)
Glucose, Bld: 85 mg/dL (ref 70–99)
Potassium: 4.7 mmol/L (ref 3.5–5.1)
Sodium: 139 mmol/L (ref 135–145)
Total Bilirubin: 0.5 mg/dL (ref 0.3–1.2)
Total Protein: 7.2 g/dL (ref 6.5–8.1)

## 2022-04-25 LAB — CBC WITH DIFFERENTIAL (CANCER CENTER ONLY)
Abs Immature Granulocytes: 0.04 10*3/uL (ref 0.00–0.07)
Basophils Absolute: 0.1 10*3/uL (ref 0.0–0.1)
Basophils Relative: 1 %
Eosinophils Absolute: 0.1 10*3/uL (ref 0.0–0.5)
Eosinophils Relative: 2 %
HCT: 38.4 % (ref 36.0–46.0)
Hemoglobin: 13.2 g/dL (ref 12.0–15.0)
Immature Granulocytes: 1 %
Lymphocytes Relative: 23 %
Lymphs Abs: 1.2 10*3/uL (ref 0.7–4.0)
MCH: 34.6 pg — ABNORMAL HIGH (ref 26.0–34.0)
MCHC: 34.4 g/dL (ref 30.0–36.0)
MCV: 100.5 fL — ABNORMAL HIGH (ref 80.0–100.0)
Monocytes Absolute: 0.4 10*3/uL (ref 0.1–1.0)
Monocytes Relative: 7 %
Neutro Abs: 3.7 10*3/uL (ref 1.7–7.7)
Neutrophils Relative %: 66 %
Platelet Count: 260 10*3/uL (ref 150–400)
RBC: 3.82 MIL/uL — ABNORMAL LOW (ref 3.87–5.11)
RDW: 14.8 % (ref 11.5–15.5)
WBC Count: 5.5 10*3/uL (ref 4.0–10.5)
nRBC: 0 % (ref 0.0–0.2)

## 2022-04-25 LAB — FERRITIN: Ferritin: 10 ng/mL — ABNORMAL LOW (ref 11–307)

## 2022-04-25 LAB — LACTATE DEHYDROGENASE: LDH: 212 U/L — ABNORMAL HIGH (ref 98–192)

## 2022-04-25 MED ORDER — FULVESTRANT 250 MG/5ML IM SOSY
500.0000 mg | PREFILLED_SYRINGE | INTRAMUSCULAR | Status: DC
  Administered 2022-04-25: 500 mg via INTRAMUSCULAR
  Filled 2022-04-25: qty 10

## 2022-04-25 MED ORDER — DENOSUMAB 120 MG/1.7ML ~~LOC~~ SOLN
120.0000 mg | Freq: Once | SUBCUTANEOUS | Status: AC
  Administered 2022-04-25: 120 mg via SUBCUTANEOUS
  Filled 2022-04-25: qty 1.7

## 2022-04-25 NOTE — Patient Instructions (Signed)
Fulvestrant Injection What is this medication? FULVESTRANT (ful VES trant) treats breast cancer. It works by blocking the hormone estrogen in breast tissue, which prevents breast cancer cells from spreading or growing. This medicine may be used for other purposes; ask your health care provider or pharmacist if you have questions. COMMON BRAND NAME(S): FASLODEX What should I tell my care team before I take this medication? They need to know if you have any of these conditions: Bleeding disorder Liver disease Low blood cell levels, such as low white cells, red cells, and platelets An unusual or allergic reaction to fulvestrant, other medications, foods, dyes, or preservatives Pregnant or trying to get pregnant Breast-feeding How should I use this medication? This medication is injected into a muscle. It is given by your care team in a hospital or clinic setting. Talk to your care team about the use of this medication in children. Special care may be needed. Overdosage: If you think you have taken too much of this medicine contact a poison control center or emergency room at once. NOTE: This medicine is only for you. Do not share this medicine with others. What if I miss a dose? Keep appointments for follow-up doses. It is important not to miss your dose. Call your care team if you are unable to keep an appointment. What may interact with this medication? Certain medications that prevent or treat blood clots, such as warfarin, enoxaparin, dalteparin, apixaban, dabigatran, rivaroxaban This list may not describe all possible interactions. Give your health care provider a list of all the medicines, herbs, non-prescription drugs, or dietary supplements you use. Also tell them if you smoke, drink alcohol, or use illegal drugs. Some items may interact with your medicine. What should I watch for while using this medication? Your condition will be monitored carefully while you are receiving this  medication. You may need blood work while taking this medication. Talk to your care team if you may be pregnant. Serious birth defects can occur if you take this medication during pregnancy and for 1 year after the last dose. You will need a negative pregnancy test before starting this medication. Contraception is recommended while taking this medication and for 1 year after the last dose. Your care team can help you find the option that works for you. Do not breastfeed while taking this medication and for 1 year after the last dose. This medication may cause infertility. Talk to your care team if you are concerned about your fertility. What side effects may I notice from receiving this medication? Side effects that you should report to your care team as soon as possible: Allergic reactions or angioedema--skin rash, itching or hives, swelling of the face, eyes, lips, tongue, arms, or legs, trouble swallowing or breathing Pain, tingling, or numbness in the hands or feet Side effects that usually do not require medical attention (report to your care team if they continue or are bothersome): Bone, joint, or muscle pain Constipation Headache Hot flashes Nausea Pain, redness, or irritation at injection site Unusual weakness or fatigue This list may not describe all possible side effects. Call your doctor for medical advice about side effects. You may report side effects to FDA at 1-800-FDA-1088. Where should I keep my medication? This medication is given in a hospital or clinic. It will not be stored at home. NOTE: This sheet is a summary. It may not cover all possible information. If you have questions about this medicine, talk to your doctor, pharmacist, or health care provider.    2023 Elsevier/Gold Standard (2021-09-06 00:00:00) Denosumab Injection (Oncology) What is this medication? DENOSUMAB (den oh SUE mab) prevents weakened bones caused by cancer. It may also be used to treat noncancerous  bone tumors that cannot be removed by surgery. It can also be used to treat high calcium levels in the blood caused by cancer. It works by blocking a protein that causes bones to break down quickly. This slows down the release of calcium from bones, which lowers calcium levels in your blood. It also makes your bones stronger and less likely to break (fracture). This medicine may be used for other purposes; ask your health care provider or pharmacist if you have questions. COMMON BRAND NAME(S): XGEVA What should I tell my care team before I take this medication? They need to know if you have any of these conditions: Dental disease Having surgery or tooth extraction Infection Kidney disease Low levels of calcium or vitamin D in the blood Malnutrition On hemodialysis Skin conditions or sensitivity Thyroid or parathyroid disease An unusual reaction to denosumab, other medications, foods, dyes, or preservatives Pregnant or trying to get pregnant Breast-feeding How should I use this medication? This medication is for injection under the skin. It is given by your care team in a hospital or clinic setting. A special MedGuide will be given to you before each treatment. Be sure to read this information carefully each time. Talk to your care team about the use of this medication in children. While it may be prescribed for children as young as 13 years for selected conditions, precautions do apply. Overdosage: If you think you have taken too much of this medicine contact a poison control center or emergency room at once. NOTE: This medicine is only for you. Do not share this medicine with others. What if I miss a dose? Keep appointments for follow-up doses. It is important not to miss your dose. Call your care team if you are unable to keep an appointment. What may interact with this medication? Do not take this medication with any of the following: Other medications containing denosumab This  medication may also interact with the following: Medications that lower your chance of fighting infection Steroid medications, such as prednisone or cortisone This list may not describe all possible interactions. Give your health care provider a list of all the medicines, herbs, non-prescription drugs, or dietary supplements you use. Also tell them if you smoke, drink alcohol, or use illegal drugs. Some items may interact with your medicine. What should I watch for while using this medication? Your condition will be monitored carefully while you are receiving this medication. You may need blood work while taking this medication. This medication may increase your risk of getting an infection. Call your care team for advice if you get a fever, chills, sore throat, or other symptoms of a cold or flu. Do not treat yourself. Try to avoid being around people who are sick. You should make sure you get enough calcium and vitamin D while you are taking this medication, unless your care team tells you not to. Discuss the foods you eat and the vitamins you take with your care team. Some people who take this medication have severe bone, joint, or muscle pain. This medication may also increase your risk for jaw problems or a broken thigh bone. Tell your care team right away if you have severe pain in your jaw, bones, joints, or muscles. Tell your care team if you have any pain that does not go  away or that gets worse. Talk to your care team if you may be pregnant. Serious birth defects can occur if you take this medication during pregnancy and for 5 months after the last dose. You will need a negative pregnancy test before starting this medication. Contraception is recommended while taking this medication and for 5 months after the last dose. Your care team can help you find the option that works for you. What side effects may I notice from receiving this medication? Side effects that you should report to your care  team as soon as possible: Allergic reactions--skin rash, itching, hives, swelling of the face, lips, tongue, or throat Bone, joint, or muscle pain Low calcium level--muscle pain or cramps, confusion, tingling, or numbness in the hands or feet Osteonecrosis of the jaw--pain, swelling, or redness in the mouth, numbness of the jaw, poor healing after dental work, unusual discharge from the mouth, visible bones in the mouth Side effects that usually do not require medical attention (report to your care team if they continue or are bothersome): Cough Diarrhea Fatigue Headache Nausea This list may not describe all possible side effects. Call your doctor for medical advice about side effects. You may report side effects to FDA at 1-800-FDA-1088. Where should I keep my medication? This medication is given in a hospital or clinic. It will not be stored at home. NOTE: This sheet is a summary. It may not cover all possible information. If you have questions about this medicine, talk to your doctor, pharmacist, or health care provider.  2023 Elsevier/Gold Standard (2021-09-13 00:00:00)

## 2022-04-25 NOTE — Progress Notes (Signed)
Hematology and Oncology Follow Up Visit  Caitlin Greene [id number] 01/16/1966 56 y.o. 04/25/2022   Principle Diagnosis:  Metastatic breast cancer-ER positive/HER-2 negative --bone metastasis only Iron deficiency anemia  Current Therapy:   Faslodex 500 mg IM monthly --start on 04/2020 Ibrance 100 mg p.o. daily (21d on/7d off) - start on 04/2020 Xgeva 120 mg subcu every 3 months -next dose 07/2022 IV iron-Feraheme given on 09/14/2021     Interim History:  Caitlin Greene is in for follow-up.  She is doing quite well.  She really has no specific complaints.  She had pneumonia recently.  She has gotten over this.  We did go ahead and do a CT scan on her.  This was done on 04/18/2022.  The CT scan did not show any evidence of disease progression.  She not yet had a bone scan.  We have to get her set up with a bone scan.  Her last CA 27.29 was down to 40.  She has had no problems with cough or shortness of breath.  There is been no change in bowel or bladder habits.  She has had no hot flashes or sweats.  She has had no skin rashes.  Her skin is on the dry side.  There is been no issues with bleeding.  She has had no headache.  There is been no visual changes.  She did have a nice Thanksgiving.  She will be with her family for Christmas.  Overall, I would say that her performance status is probably ECOG 0.    Medications:  Current Outpatient Medications:    acetaminophen (TYLENOL) 650 MG CR tablet, Take 650 mg by mouth every 8 (eight) hours as needed for pain., Disp: , Rfl:    calcium carbonate (OS-CAL) 600 MG tablet, Take 1 tablet (600 mg total) by mouth 2 (two) times daily., Disp: 30 tablet, Rfl: 0   Ferrous Sulfate (IRON) 325 (65 Fe) MG TABS, Take 1 tablet (325 mg total) by mouth daily at 2 PM., Disp: 30 tablet, Rfl: 0   FLUoxetine (PROZAC) 20 MG capsule, TAKE 3 CAPSULES(60 MG) BY MOUTH DAILY (Patient taking differently: 60 mg daily.), Disp: 270 capsule, Rfl: 3   fulvestrant  (FASLODEX) 250 MG/5ML injection, Inject 500 mg into the muscle every 30 (thirty) days. One injection each buttock over 1-2 minutes. Warm prior to use., Disp: , Rfl:    IBRANCE 100 MG tablet, TAKE 1 TABLET DAILY ON DAYS 1 THROUGH 21 OF CYCLE, 7 DAYS OFF. REPEAT, Disp: 21 tablet, Rfl: 0   oxyCODONE (OXY IR/ROXICODONE) 5 MG immediate release tablet, Take 1 tablet (5 mg total) by mouth every 8 (eight) hours as needed for severe pain., Disp: 50 tablet, Rfl: 0   pantoprazole (PROTONIX) 40 MG tablet, Take 1 tablet (40 mg total) by mouth 2 (two) times daily., Disp: 60 tablet, Rfl: 2   vitamin B-12 (CYANOCOBALAMIN) 1000 MCG tablet, Take 1 tablet (1,000 mcg total) by mouth daily., Disp: 30 tablet, Rfl: 0   Wheat Dextrin (BENEFIBER DRINK MIX PO), Take by mouth daily at 6 (six) AM., Disp: , Rfl:  No current facility-administered medications for this visit.  Facility-Administered Medications Ordered in Other Visits:    fulvestrant (FASLODEX) injection 500 mg, 500 mg, Intramuscular, Q30 days, [name], MD, 500 mg at 04/25/22 1614  Allergies:  Allergies  Allergen Reactions   Amoxicillin-Pot Clavulanate Hives and Itching   Iron Anaphylaxis and Other (See Comments)    Patient develops hypotension, resp distress after infusion of  feraheme.    Penicillins Hives, Itching and Other (See Comments)   Codeine Nausea And Vomiting    Needs pre-meds     Past Medical History, Surgical history, Social history, and Family History were reviewed and updated.  Review of Systems: Review of Systems  Constitutional:  Positive for appetite change.  HENT:  Negative.    Eyes: Negative.   Respiratory: Negative.    Cardiovascular: Negative.   Gastrointestinal:  Positive for abdominal pain and nausea.  Endocrine: Negative.   Genitourinary: Negative.    Musculoskeletal:  Positive for arthralgias and back pain.  Skin: Negative.   Neurological:  Positive for dizziness.  Hematological: Negative.    Psychiatric/Behavioral: Negative.      Physical Exam:  height is _0  (1.727 m) and weight is 201 lb (91.2 kg). Her oral temperature is 98.2 F (36.8 C). Her blood pressure is 128/85 and her pulse is 96. Her respiration is 16 and oxygen saturation is 100%.   Wt Readings from Last 3 Encounters:  04/25/22 201 lb (91.2 kg)  03/28/22 199 lb (90.3 kg)  03/14/22 196 lb 3.2 oz (89 kg)    Physical Exam Vitals reviewed.  HENT:     Head: Normocephalic and atraumatic.  Eyes:     Pupils: Pupils are equal, round, and reactive to light.  Cardiovascular:     Rate and Rhythm: Normal rate and regular rhythm.     Heart sounds: Normal heart sounds.  Pulmonary:     Effort: Pulmonary effort is normal.     Breath sounds: Normal breath sounds.  Abdominal:     General: Bowel sounds are normal.     Palpations: Abdomen is soft.  Musculoskeletal:        General: No tenderness or deformity. Normal range of motion.     Cervical back: Normal range of motion.  Lymphadenopathy:     Cervical: No cervical adenopathy.  Skin:    General: Skin is warm and dry.     Findings: No erythema or rash.  Neurological:     Mental Status: She is alert and oriented to person, place, and time.  Psychiatric:        Behavior: Behavior normal.        Thought Content: Thought content normal.        Judgment: Judgment normal.     Lab Results  Component Value Date   WBC 5.5 04/25/2022   HGB 13.2 04/25/2022   HCT 38.4 04/25/2022   MCV 100.5 (H) 04/25/2022   PLT 260 04/25/2022     Chemistry      Component Value Date/Time   NA 139 04/25/2022 1443   K 4.7 04/25/2022 1443   CL 103 04/25/2022 1443   CO2 29 04/25/2022 1443   BUN 13 04/25/2022 1443   CREATININE 1.01 (H) 04/25/2022 1443   CREATININE 0.88 10/22/2019 1246      Component Value Date/Time   CALCIUM 9.1 04/25/2022 1443   ALKPHOS 50 04/25/2022 1443   AST 13 (L) 04/25/2022 1443   ALT 15 04/25/2022 1443   BILITOT 0.5 04/25/2022 1443       Impression and Plan: Caitlin Greene is a very charming 56 year old postmenopausal female with metastatic breast cancer.  We actually had seen her many years ago with a carcinoma in situ.  I think she is done incredibly well with her disease.  She has metastatic breast cancer that really has been in her bones.  She has had no disease progression.  Again we will  have to see about a bone scan to see if that shows [place] anything.  For right now, we will plan to get her back in another month.  She will get her Delton See today.  She is not due for next dose until March.  Again, this is all about quality of life.  I think her quality of life really has been quite good.  I am just so happy that she got through this year and we have not had to make any adjustments with her protocol.

## 2022-04-26 DIAGNOSIS — K5900 Constipation, unspecified: Secondary | ICD-10-CM | POA: Diagnosis not present

## 2022-04-26 DIAGNOSIS — Z008 Encounter for other general examination: Secondary | ICD-10-CM | POA: Diagnosis not present

## 2022-04-26 DIAGNOSIS — I739 Peripheral vascular disease, unspecified: Secondary | ICD-10-CM | POA: Diagnosis not present

## 2022-04-26 DIAGNOSIS — G8929 Other chronic pain: Secondary | ICD-10-CM | POA: Diagnosis not present

## 2022-04-26 DIAGNOSIS — K219 Gastro-esophageal reflux disease without esophagitis: Secondary | ICD-10-CM | POA: Diagnosis not present

## 2022-04-26 DIAGNOSIS — R030 Elevated blood-pressure reading, without diagnosis of hypertension: Secondary | ICD-10-CM | POA: Diagnosis not present

## 2022-04-26 DIAGNOSIS — Z853 Personal history of malignant neoplasm of breast: Secondary | ICD-10-CM | POA: Diagnosis not present

## 2022-04-26 DIAGNOSIS — R69 Illness, unspecified: Secondary | ICD-10-CM | POA: Diagnosis not present

## 2022-04-26 DIAGNOSIS — C7951 Secondary malignant neoplasm of bone: Secondary | ICD-10-CM | POA: Diagnosis not present

## 2022-04-26 LAB — IRON AND IRON BINDING CAPACITY (CC-WL,HP ONLY)
Iron: 119 ug/dL (ref 28–170)
Saturation Ratios: 27 % (ref 10.4–31.8)
TIBC: 438 ug/dL (ref 250–450)
UIBC: 319 ug/dL (ref 148–442)

## 2022-04-26 LAB — CANCER ANTIGEN 27.29: CA 27.29: 49 U/mL — ABNORMAL HIGH (ref 0.0–38.6)

## 2022-04-27 ENCOUNTER — Encounter: Payer: Self-pay | Admitting: Hematology & Oncology

## 2022-04-27 ENCOUNTER — Other Ambulatory Visit: Payer: Self-pay | Admitting: Pharmacist

## 2022-04-27 NOTE — Telephone Encounter (Signed)
Pt declined scheduling an IV iron apt. Spoke with pt who states she is hesitant to try another iron since her reaction previously. Advised her we are adding on pre-med's too. Pt would like to know if she can try the OTC iron daily and see if that helps. Pt has currently not been taking it.   Per Dr Marin Olp ok to take oral iron for now. Advised pt and she states understanding.

## 2022-04-27 NOTE — Addendum Note (Signed)
Addended by: Burney Gauze R on: 04/27/2022 01:45 PM   Modules accepted: Orders

## 2022-05-02 ENCOUNTER — Other Ambulatory Visit: Payer: Self-pay | Admitting: Hematology & Oncology

## 2022-05-19 ENCOUNTER — Encounter (HOSPITAL_COMMUNITY)
Admission: RE | Admit: 2022-05-19 | Discharge: 2022-05-19 | Disposition: A | Discharge status: Home / Self Care | Payer: Medicare HMO | Source: Ambulatory Visit | Attending: Hematology & Oncology | Admitting: Hematology & Oncology

## 2022-05-19 DIAGNOSIS — C50919 Malignant neoplasm of unspecified site of unspecified female breast: Secondary | ICD-10-CM | POA: Diagnosis not present

## 2022-05-19 DIAGNOSIS — C7951 Secondary malignant neoplasm of bone: Secondary | ICD-10-CM | POA: Insufficient documentation

## 2022-05-19 MED ORDER — TECHNETIUM TC 99M MEDRONATE IV KIT
20.1000 | PACK | Freq: Once | INTRAVENOUS | Status: AC
  Administered 2022-05-19: 20.1 via INTRAVENOUS

## 2022-05-24 ENCOUNTER — Other Ambulatory Visit: Payer: Self-pay

## 2022-05-24 ENCOUNTER — Inpatient Hospital Stay (HOSPITAL_BASED_OUTPATIENT_CLINIC_OR_DEPARTMENT_OTHER): Payer: Medicare HMO | Admitting: Hematology & Oncology

## 2022-05-24 ENCOUNTER — Inpatient Hospital Stay: Payer: Medicare HMO

## 2022-05-24 ENCOUNTER — Encounter: Payer: Self-pay | Admitting: Hematology & Oncology

## 2022-05-24 ENCOUNTER — Inpatient Hospital Stay: Payer: Medicare HMO | Attending: Hematology & Oncology

## 2022-05-24 VITALS — BP 116/79 | HR 90 | Temp 98.0°F | Resp 18 | Ht 68.0 in | Wt 202.0 lb

## 2022-05-24 DIAGNOSIS — D509 Iron deficiency anemia, unspecified: Secondary | ICD-10-CM | POA: Diagnosis not present

## 2022-05-24 DIAGNOSIS — C50011 Malignant neoplasm of nipple and areola, right female breast: Secondary | ICD-10-CM | POA: Insufficient documentation

## 2022-05-24 DIAGNOSIS — C7951 Secondary malignant neoplasm of bone: Secondary | ICD-10-CM | POA: Insufficient documentation

## 2022-05-24 DIAGNOSIS — Z79899 Other long term (current) drug therapy: Secondary | ICD-10-CM | POA: Insufficient documentation

## 2022-05-24 DIAGNOSIS — M79605 Pain in left leg: Secondary | ICD-10-CM

## 2022-05-24 DIAGNOSIS — Z170 Estrogen receptor positive status [ER+]: Secondary | ICD-10-CM | POA: Insufficient documentation

## 2022-05-24 DIAGNOSIS — C50919 Malignant neoplasm of unspecified site of unspecified female breast: Secondary | ICD-10-CM

## 2022-05-24 LAB — CMP (CANCER CENTER ONLY)
ALT: 17 U/L (ref 0–44)
AST: 16 U/L (ref 15–41)
Albumin: 4.3 g/dL (ref 3.5–5.0)
Alkaline Phosphatase: 41 U/L (ref 38–126)
Anion gap: 11 (ref 5–15)
BUN: 12 mg/dL (ref 6–20)
CO2: 23 mmol/L (ref 22–32)
Calcium: 9.1 mg/dL (ref 8.9–10.3)
Chloride: 105 mmol/L (ref 98–111)
Creatinine: 0.9 mg/dL (ref 0.44–1.00)
GFR, Estimated: >60 mL/min (ref >60)
Glucose, Bld: 121 mg/dL — ABNORMAL HIGH (ref 70–99)
Potassium: 3.7 mmol/L (ref 3.5–5.1)
Sodium: 139 mmol/L (ref 135–145)
Total Bilirubin: 0.5 mg/dL (ref 0.3–1.2)
Total Protein: 7.1 g/dL (ref 6.5–8.1)

## 2022-05-24 LAB — CBC WITH DIFFERENTIAL (CANCER CENTER ONLY)
Abs Immature Granulocytes: 0 10*3/uL (ref 0.00–0.07)
Basophils Absolute: 0 10*3/uL (ref 0.0–0.1)
Basophils Relative: 2 %
Eosinophils Absolute: 0 10*3/uL (ref 0.0–0.5)
Eosinophils Relative: 2 %
HCT: 37.1 % (ref 36.0–46.0)
Hemoglobin: 12.7 g/dL (ref 12.0–15.0)
Immature Granulocytes: 0 %
Lymphocytes Relative: 29 %
Lymphs Abs: 0.8 10*3/uL (ref 0.7–4.0)
MCH: 34.9 pg — ABNORMAL HIGH (ref 26.0–34.0)
MCHC: 34.2 g/dL (ref 30.0–36.0)
MCV: 101.9 fL — ABNORMAL HIGH (ref 80.0–100.0)
Monocytes Absolute: 0.4 10*3/uL (ref 0.1–1.0)
Monocytes Relative: 16 %
Neutro Abs: 1.4 10*3/uL — ABNORMAL LOW (ref 1.7–7.7)
Neutrophils Relative %: 51 %
Platelet Count: 186 10*3/uL (ref 150–400)
RBC: 3.64 MIL/uL — ABNORMAL LOW (ref 3.87–5.11)
RDW: 16.1 % — ABNORMAL HIGH (ref 11.5–15.5)
WBC Count: 2.7 10*3/uL — ABNORMAL LOW (ref 4.0–10.5)
nRBC: 0 % (ref 0.0–0.2)

## 2022-05-24 LAB — LACTATE DEHYDROGENASE: LDH: 197 U/L — ABNORMAL HIGH (ref 98–192)

## 2022-05-24 MED ORDER — FULVESTRANT 250 MG/5ML IM SOSY
500.0000 mg | PREFILLED_SYRINGE | INTRAMUSCULAR | Status: DC
  Administered 2022-05-24: 500 mg via INTRAMUSCULAR
  Filled 2022-05-24: qty 10

## 2022-05-24 NOTE — Progress Notes (Signed)
Skin Hematology and Oncology Follow Up Visit  Caitlin Greene [id number] 1965/08/25 57 y.o. 05/24/2022   Principle Diagnosis:  Metastatic breast cancer-ER positive/HER-2 negative --bone metastasis only Iron deficiency anemia  Current Therapy:   Faslodex 500 mg IM monthly --start on 04/2020 Ibrance 100 mg p.o. daily (21d on/7d off) - start on 04/2020 Xgeva 120 mg subcu every 3 months -next dose 07/2022 IV iron-Feraheme given on 09/14/2021     Interim History:  Caitlin Greene is in for follow-up.  Her main complaint has been some pain in the left lower leg.  She said that some nurse came out to her house and told her that she had poor circulation.  I am not sure exactly what this means.  We will have to get a Doppler of her legs so that we can make sure there is no thromboembolic disease.  Otherwise, she is doing okay.  She had a nice Christmas and New Year's.  Her last CA 27.29 was 49.  We did do a bone scan on her.  This was done on 05/19/2022.  The bone scan basically showed stable changes in her bones.  There is nothing really that showed active disease.  She does have some chronic bony issues with pain.  Diet has been okay.  She has had no nausea or vomiting.  She has had no change in bowel or bladder habits.  There is been no rashes.  She has had no bleeding.  She is avoided COVID and Influenza.  Overall, I would say performance status is probably ECOG 1.    Medications:  Current Outpatient Medications:    acetaminophen (TYLENOL) 650 MG CR tablet, Take 650 mg by mouth every 8 (eight) hours as needed for pain., Disp: , Rfl:    calcium carbonate (OS-CAL) 600 MG tablet, Take 1 tablet (600 mg total) by mouth 2 (two) times daily., Disp: 30 tablet, Rfl: 0   Ferrous Sulfate (IRON) 325 (65 Fe) MG TABS, Take 1 tablet (325 mg total) by mouth daily at 2 PM., Disp: 30 tablet, Rfl: 0   FLUoxetine (PROZAC) 20 MG capsule, TAKE 3 CAPSULES(60 MG) BY MOUTH DAILY (Patient taking differently: 60 mg  daily.), Disp: 270 capsule, Rfl: 3   fulvestrant (FASLODEX) 250 MG/5ML injection, Inject 500 mg into the muscle every 30 (thirty) days. One injection each buttock over 1-2 minutes. Warm prior to use., Disp: , Rfl:    IBRANCE 100 MG tablet, TAKE 1 TABLET DAILY ON DAYS 1 THROUGH 21 OF CYCLE, 7 DAYS OFF. REPEAT, Disp: 21 tablet, Rfl: 0   oxyCODONE (OXY IR/ROXICODONE) 5 MG immediate release tablet, Take 1 tablet (5 mg total) by mouth every 8 (eight) hours as needed for severe pain., Disp: 50 tablet, Rfl: 0   pantoprazole (PROTONIX) 40 MG tablet, Take 1 tablet (40 mg total) by mouth 2 (two) times daily., Disp: 60 tablet, Rfl: 2   vitamin B-12 (CYANOCOBALAMIN) 1000 MCG tablet, Take 1 tablet (1,000 mcg total) by mouth daily., Disp: 30 tablet, Rfl: 0   Wheat Dextrin (BENEFIBER DRINK MIX PO), Take by mouth daily at 6 (six) AM., Disp: , Rfl:   Allergies:  Allergies  Allergen Reactions   Amoxicillin-Pot Clavulanate Hives and Itching   Iron Anaphylaxis and Other (See Comments)    Patient develops hypotension, resp distress after infusion of feraheme.    Penicillins Hives, Itching and Other (See Comments)   Codeine Nausea And Vomiting    Needs pre-meds     Past Medical History, Surgical history,  Social history, and Family History were reviewed and updated.  Review of Systems: Review of Systems  Constitutional:  Positive for appetite change.  HENT:  Negative.    Eyes: Negative.   Respiratory: Negative.    Cardiovascular: Negative.   Gastrointestinal:  Positive for abdominal pain and nausea.  Endocrine: Negative.   Genitourinary: Negative.    Musculoskeletal:  Positive for arthralgias and back pain.  Skin: Negative.   Neurological:  Positive for dizziness.  Hematological: Negative.   Psychiatric/Behavioral: Negative.      Physical Exam:  height is '5\' 8"'$  (1.727 m) and weight is 202 lb (91.6 kg). Her oral temperature is 98 F (36.7 C). Her blood pressure is 116/79 and her pulse is 90. Her  respiration is 18 and oxygen saturation is 100%.   Wt Readings from Last 3 Encounters:  05/24/22 202 lb (91.6 kg)  04/25/22 201 lb (91.2 kg)  03/28/22 199 lb (90.3 kg)    Physical Exam Vitals reviewed.  HENT:     Head: Normocephalic and atraumatic.  Eyes:     Pupils: Pupils are equal, round, and reactive to light.  Cardiovascular:     Rate and Rhythm: Normal rate and regular rhythm.     Heart sounds: Normal heart sounds.  Pulmonary:     Effort: Pulmonary effort is normal.     Breath sounds: Normal breath sounds.  Abdominal:     General: Bowel sounds are normal.     Palpations: Abdomen is soft.  Musculoskeletal:        General: No tenderness or deformity. Normal range of motion.     Cervical back: Normal range of motion.  Lymphadenopathy:     Cervical: No cervical adenopathy.  Skin:    General: Skin is warm and dry.     Findings: No erythema or rash.  Neurological:     Mental Status: She is alert and oriented to person, place, and time.  Psychiatric:        Behavior: Behavior normal.        Thought Content: Thought content normal.        Judgment: Judgment normal.     Lab Results  Component Value Date   WBC 2.7 (L) 05/24/2022   HGB 12.7 05/24/2022   HCT 37.1 05/24/2022   MCV 101.9 (H) 05/24/2022   PLT 186 05/24/2022     Chemistry      Component Value Date/Time   NA 139 05/24/2022 0907   K 3.7 05/24/2022 0907   CL 105 05/24/2022 0907   CO2 23 05/24/2022 0907   BUN 12 05/24/2022 0907   CREATININE 0.90 05/24/2022 0907   CREATININE 0.88 10/22/2019 1246      Component Value Date/Time   CALCIUM 9.1 05/24/2022 0907   ALKPHOS 41 05/24/2022 0907   AST 16 05/24/2022 0907   ALT 17 05/24/2022 0907   BILITOT 0.5 05/24/2022 7741      Impression and Plan: Caitlin Greene is a very charming 57 year old postmenopausal female with metastatic breast cancer.  We actually had seen her many years ago with a carcinoma in situ.  I think she is done incredibly well with her  disease.  She has metastatic breast cancer that really has been in her bones.  She has had no disease progression.  I am glad that the bone scan looked stable.  She wants to get a Port-A-Cath.  She has difficult IV access.  We will see about getting a Port-A-Cath for her.  We will also get  a Doppler of her left leg.  I do not see any swelling in the left leg.  I cannot palpate any count of venous cord.  She has a decent pulse.  Will plan to get her back in another month.  She will get her Faslodex today.

## 2022-05-24 NOTE — Patient Instructions (Signed)

## 2022-05-25 LAB — CANCER ANTIGEN 27.29: CA 27.29: 52.7 U/mL — ABNORMAL HIGH (ref 0.0–38.6)

## 2022-05-26 ENCOUNTER — Ambulatory Visit (HOSPITAL_BASED_OUTPATIENT_CLINIC_OR_DEPARTMENT_OTHER)
Admission: RE | Admit: 2022-05-26 | Discharge: 2022-05-26 | Disposition: A | Discharge status: Home / Self Care | Payer: Medicare HMO | Source: Ambulatory Visit | Attending: Hematology & Oncology | Admitting: Hematology & Oncology

## 2022-05-26 ENCOUNTER — Encounter: Payer: Self-pay | Admitting: *Deleted

## 2022-05-26 DIAGNOSIS — M79605 Pain in left leg: Secondary | ICD-10-CM | POA: Diagnosis not present

## 2022-05-26 DIAGNOSIS — M79662 Pain in left lower leg: Secondary | ICD-10-CM | POA: Diagnosis not present

## 2022-05-27 ENCOUNTER — Other Ambulatory Visit: Payer: Self-pay | Admitting: Internal Medicine

## 2022-06-14 ENCOUNTER — Ambulatory Visit: Payer: BC Managed Care – PPO | Admitting: Internal Medicine

## 2022-06-22 ENCOUNTER — Encounter: Payer: Self-pay | Admitting: Hematology & Oncology

## 2022-06-22 ENCOUNTER — Inpatient Hospital Stay: Payer: Medicare HMO | Attending: Hematology & Oncology

## 2022-06-22 ENCOUNTER — Inpatient Hospital Stay: Payer: Medicare HMO

## 2022-06-22 ENCOUNTER — Inpatient Hospital Stay (HOSPITAL_BASED_OUTPATIENT_CLINIC_OR_DEPARTMENT_OTHER): Payer: Medicare HMO | Admitting: Hematology & Oncology

## 2022-06-22 VITALS — BP 116/83 | HR 104 | Temp 97.8°F | Resp 18 | Wt 201.0 lb

## 2022-06-22 DIAGNOSIS — Z79899 Other long term (current) drug therapy: Secondary | ICD-10-CM | POA: Insufficient documentation

## 2022-06-22 DIAGNOSIS — C50011 Malignant neoplasm of nipple and areola, right female breast: Secondary | ICD-10-CM | POA: Insufficient documentation

## 2022-06-22 DIAGNOSIS — C7951 Secondary malignant neoplasm of bone: Secondary | ICD-10-CM | POA: Diagnosis not present

## 2022-06-22 DIAGNOSIS — Z5111 Encounter for antineoplastic chemotherapy: Secondary | ICD-10-CM | POA: Insufficient documentation

## 2022-06-22 DIAGNOSIS — D509 Iron deficiency anemia, unspecified: Secondary | ICD-10-CM | POA: Insufficient documentation

## 2022-06-22 DIAGNOSIS — C50919 Malignant neoplasm of unspecified site of unspecified female breast: Secondary | ICD-10-CM

## 2022-06-22 DIAGNOSIS — Z170 Estrogen receptor positive status [ER+]: Secondary | ICD-10-CM | POA: Insufficient documentation

## 2022-06-22 LAB — CBC WITH DIFFERENTIAL (CANCER CENTER ONLY)
Abs Immature Granulocytes: 0.01 10*3/uL (ref 0.00–0.07)
Basophils Absolute: 0.1 10*3/uL (ref 0.0–0.1)
Basophils Relative: 1 %
Eosinophils Absolute: 0 10*3/uL (ref 0.0–0.5)
Eosinophils Relative: 1 %
HCT: 35.1 % — ABNORMAL LOW (ref 36.0–46.0)
Hemoglobin: 12.3 g/dL (ref 12.0–15.0)
Immature Granulocytes: 0 %
Lymphocytes Relative: 30 %
Lymphs Abs: 1.1 10*3/uL (ref 0.7–4.0)
MCH: 35 pg — ABNORMAL HIGH (ref 26.0–34.0)
MCHC: 35 g/dL (ref 30.0–36.0)
MCV: 100 fL (ref 80.0–100.0)
Monocytes Absolute: 0.5 10*3/uL (ref 0.1–1.0)
Monocytes Relative: 13 %
Neutro Abs: 2 10*3/uL (ref 1.7–7.7)
Neutrophils Relative %: 55 %
Platelet Count: 254 10*3/uL (ref 150–400)
RBC: 3.51 MIL/uL — ABNORMAL LOW (ref 3.87–5.11)
RDW: 15.8 % — ABNORMAL HIGH (ref 11.5–15.5)
WBC Count: 3.7 10*3/uL — ABNORMAL LOW (ref 4.0–10.5)
nRBC: 0 % (ref 0.0–0.2)

## 2022-06-22 LAB — CMP (CANCER CENTER ONLY)
ALT: 17 U/L (ref 0–44)
AST: 17 U/L (ref 15–41)
Albumin: 4.4 g/dL (ref 3.5–5.0)
Alkaline Phosphatase: 41 U/L (ref 38–126)
Anion gap: 10 (ref 5–15)
BUN: 12 mg/dL (ref 6–20)
CO2: 26 mmol/L (ref 22–32)
Calcium: 9.3 mg/dL (ref 8.9–10.3)
Chloride: 106 mmol/L (ref 98–111)
Creatinine: 0.95 mg/dL (ref 0.44–1.00)
GFR, Estimated: >60 mL/min (ref >60)
Glucose, Bld: 130 mg/dL — ABNORMAL HIGH (ref 70–99)
Potassium: 2.9 mmol/L — ABNORMAL LOW (ref 3.5–5.1)
Sodium: 142 mmol/L (ref 135–145)
Total Bilirubin: 0.6 mg/dL (ref 0.3–1.2)
Total Protein: 7 g/dL (ref 6.5–8.1)

## 2022-06-22 MED ORDER — FULVESTRANT 250 MG/5ML IM SOSY
500.0000 mg | PREFILLED_SYRINGE | INTRAMUSCULAR | Status: DC
  Administered 2022-06-22: 500 mg via INTRAMUSCULAR
  Filled 2022-06-22: qty 10

## 2022-06-22 MED ORDER — AZITHROMYCIN 250 MG PO TABS: ORAL_TABLET | ORAL | 0 refills | Status: DC

## 2022-06-22 NOTE — Progress Notes (Signed)
Skin Hematology and Oncology Follow Up Visit  Caitlin Greene [id number] Aug 28, 1965 57 y.o. 06/22/2022   Principle Diagnosis:  Metastatic breast cancer-ER positive/HER-2 negative --bone metastasis only Iron deficiency anemia  Current Therapy:   Faslodex 500 mg IM monthly --start on 04/2020 Ibrance 100 mg p.o. daily (21d on/7d off) - start on 04/2020 Xgeva 120 mg subcu every 3 months -next dose 07/2022 IV iron-Feraheme given on 09/14/2021     Interim History:  Caitlin Greene is in for follow-up.  Unfortunately, one of her sisters is having a lot of health problems right now.  It sounds like she has some type of degenerative neurological issue.  She is being evaluated forJKD.  She had LP done.  Hopefully, the results will be back later on this week.  Caitlin Greene is under some stress because of this.  Her sister is close to her.  We last saw Caitlin Greene, we did do a Doppler of her left leg.  This was unremarkable for any probable [place] disease.  Her last CA 27.29 was rising very slowly.  The level was 53.  She is due for scans when we see her back in March.  Her appetite has been okay.  She has had no change in bowel or bladder habits.  There is been no melena or hematochezia.  Her last iron studies that we did back in December did show an iron saturation of 10%.  Because her hemoglobin was doing well, we did not give her any IV iron.  She has had some chronic pain issues from her metastatic disease.  However, they seem to be under decent control.  Currently, I would have said that her performance status is probably ECOG 1. .  Medications:  Current Outpatient Medications:    acetaminophen (TYLENOL) 650 MG CR tablet, Take 650 mg by mouth every 8 (eight) hours as needed for pain., Disp: , Rfl:    calcium carbonate (OS-CAL) 600 MG tablet, Take 1 tablet (600 mg total) by mouth 2 (two) times daily., Disp: 30 tablet, Rfl: 0   Ferrous Sulfate (IRON) 325 (65 Fe) MG TABS, Take 1 tablet (325 mg  total) by mouth daily at 2 PM., Disp: 30 tablet, Rfl: 0   FLUoxetine (PROZAC) 20 MG capsule, TAKE 3 CAPSULES(60 MG) BY MOUTH DAILY, Disp: 90 capsule, Rfl: 0   fulvestrant (FASLODEX) 250 MG/5ML injection, Inject 500 mg into the muscle every 30 (thirty) days. One injection each buttock over 1-2 minutes. Warm prior to use., Disp: , Rfl:    IBRANCE 100 MG tablet, TAKE 1 TABLET DAILY ON DAYS 1 THROUGH 21 OF CYCLE, 7 DAYS OFF. REPEAT, Disp: 21 tablet, Rfl: 0   oxyCODONE (OXY IR/ROXICODONE) 5 MG immediate release tablet, Take 1 tablet (5 mg total) by mouth every 8 (eight) hours as needed for severe pain., Disp: 50 tablet, Rfl: 0   pantoprazole (PROTONIX) 40 MG tablet, Take 1 tablet (40 mg total) by mouth 2 (two) times daily., Disp: 60 tablet, Rfl: 2   vitamin B-12 (CYANOCOBALAMIN) 1000 MCG tablet, Take 1 tablet (1,000 mcg total) by mouth daily., Disp: 30 tablet, Rfl: 0   Wheat Dextrin (BENEFIBER DRINK MIX PO), Take by mouth daily at 6 (six) AM., Disp: , Rfl:  No current facility-administered medications for this visit.  Facility-Administered Medications Ordered in Other Visits:    fulvestrant (FASLODEX) injection 500 mg, 500 mg, Intramuscular, Q30 days, Bear Osten, Rudell Cobb, MD  Allergies:  Allergies  Allergen Reactions   Amoxicillin-Pot Clavulanate Hives and Itching  Iron Anaphylaxis and Other (See Comments)    Patient develops hypotension, resp distress after infusion of feraheme.    Penicillins Hives, Itching and Other (See Comments)   Codeine Nausea And Vomiting    Needs pre-meds     Past Medical History, Surgical history, Social history, and Family History were reviewed and updated.  Review of Systems: Review of Systems  Constitutional:  Positive for appetite change.  HENT:  Negative.    Eyes: Negative.   Respiratory: Negative.    Cardiovascular: Negative.   Gastrointestinal:  Positive for abdominal pain and nausea.  Endocrine: Negative.   Genitourinary: Negative.    Musculoskeletal:   Positive for arthralgias and back pain.  Skin: Negative.   Neurological:  Positive for dizziness.  Hematological: Negative.   Psychiatric/Behavioral: Negative.      Physical Exam:  weight is 201 lb (91.2 kg). Her oral temperature is 97.8 F (36.6 C). Her blood pressure is 116/83 and her pulse is 104 (abnormal). Her respiration is 18 and oxygen saturation is 100%.   Wt Readings from Last 3 Encounters:  06/22/22 201 lb (91.2 kg)  05/24/22 202 lb (91.6 kg)  04/25/22 201 lb (91.2 kg)    Physical Exam Vitals reviewed.  HENT:     Head: Normocephalic and atraumatic.  Eyes:     Pupils: Pupils are equal, round, and reactive to light.  Cardiovascular:     Rate and Rhythm: Normal rate and regular rhythm.     Heart sounds: Normal heart sounds.  Pulmonary:     Effort: Pulmonary effort is normal.     Breath sounds: Normal breath sounds.  Abdominal:     General: Bowel sounds are normal.     Palpations: Abdomen is soft.  Musculoskeletal:        General: No tenderness or deformity. Normal range of motion.     Cervical back: Normal range of motion.  Lymphadenopathy:     Cervical: No cervical adenopathy.  Skin:    General: Skin is warm and dry.     Findings: No erythema or rash.  Neurological:     Mental Status: She is alert and oriented to person, place, and time.  Psychiatric:        Behavior: Behavior normal.        Thought Content: Thought content normal.        Judgment: Judgment normal.      Lab Results  Component Value Date   WBC 3.7 (L) 06/22/2022   HGB 12.3 06/22/2022   HCT 35.1 (L) 06/22/2022   MCV 100.0 06/22/2022   PLT 254 06/22/2022     Chemistry      Component Value Date/Time   NA 139 05/24/2022 0907   K 3.7 05/24/2022 0907   CL 105 05/24/2022 0907   CO2 23 05/24/2022 0907   BUN 12 05/24/2022 0907   CREATININE 0.90 05/24/2022 0907   CREATININE 0.88 10/22/2019 1246      Component Value Date/Time   CALCIUM 9.1 05/24/2022 0907   ALKPHOS 41 05/24/2022  0907   AST 16 05/24/2022 0907   ALT 17 05/24/2022 0907   BILITOT 0.5 05/24/2022 [id number]      Impression and Plan: Caitlin Greene is a very charming 57 year old postmenopausal female with metastatic breast cancer.  We actually had seen her many years ago with a carcinoma in situ.  I think she is done incredibly well with her disease.  We really have not seen her now for over 2 years.  She is  done very nicely.  Again, we need to get scans set up for her.  I will get this set up before we see her back in March.  I will keep her sister in prayer.  I did give Caitlin Greene a prayer blanket to give to her sister.  Again, hopefully, there is no disease with respect to her sisters central nervous system.

## 2022-06-22 NOTE — Addendum Note (Signed)
Addended by: [name] on: 06/22/2022 08:47 PM   Modules accepted: Orders

## 2022-06-23 LAB — CANCER ANTIGEN 27.29: CA 27.29: 47.2 U/mL — ABNORMAL HIGH (ref 0.0–38.6)

## 2022-06-27 ENCOUNTER — Other Ambulatory Visit: Payer: Self-pay | Admitting: Internal Medicine

## 2022-06-30 DIAGNOSIS — Z853 Personal history of malignant neoplasm of breast: Secondary | ICD-10-CM | POA: Diagnosis not present

## 2022-06-30 DIAGNOSIS — C7981 Secondary malignant neoplasm of breast: Secondary | ICD-10-CM | POA: Diagnosis not present

## 2022-06-30 DIAGNOSIS — R69 Illness, unspecified: Secondary | ICD-10-CM | POA: Diagnosis not present

## 2022-06-30 DIAGNOSIS — Z880 Allergy status to penicillin: Secondary | ICD-10-CM | POA: Diagnosis not present

## 2022-06-30 DIAGNOSIS — G8929 Other chronic pain: Secondary | ICD-10-CM | POA: Diagnosis not present

## 2022-06-30 DIAGNOSIS — K219 Gastro-esophageal reflux disease without esophagitis: Secondary | ICD-10-CM | POA: Diagnosis not present

## 2022-06-30 DIAGNOSIS — F411 Generalized anxiety disorder: Secondary | ICD-10-CM | POA: Diagnosis not present

## 2022-06-30 DIAGNOSIS — F3341 Major depressive disorder, recurrent, in partial remission: Secondary | ICD-10-CM | POA: Diagnosis not present

## 2022-06-30 DIAGNOSIS — C7951 Secondary malignant neoplasm of bone: Secondary | ICD-10-CM | POA: Diagnosis not present

## 2022-06-30 DIAGNOSIS — Z833 Family history of diabetes mellitus: Secondary | ICD-10-CM | POA: Diagnosis not present

## 2022-06-30 DIAGNOSIS — Z8249 Family history of ischemic heart disease and other diseases of the circulatory system: Secondary | ICD-10-CM | POA: Diagnosis not present

## 2022-06-30 DIAGNOSIS — Z79891 Long term (current) use of opiate analgesic: Secondary | ICD-10-CM | POA: Diagnosis not present

## 2022-07-04 ENCOUNTER — Other Ambulatory Visit: Payer: Self-pay | Admitting: Internal Medicine

## 2022-07-14 ENCOUNTER — Other Ambulatory Visit: Payer: Self-pay | Admitting: Internal Medicine

## 2022-07-19 ENCOUNTER — Ambulatory Visit (HOSPITAL_COMMUNITY)
Admission: RE | Admit: 2022-07-19 | Discharge: 2022-07-19 | Disposition: A | Discharge status: Home / Self Care | Payer: Medicare HMO | Source: Ambulatory Visit | Attending: Hematology & Oncology | Admitting: Hematology & Oncology

## 2022-07-19 DIAGNOSIS — C7951 Secondary malignant neoplasm of bone: Secondary | ICD-10-CM

## 2022-07-19 DIAGNOSIS — J9811 Atelectasis: Secondary | ICD-10-CM | POA: Diagnosis not present

## 2022-07-19 DIAGNOSIS — C801 Malignant (primary) neoplasm, unspecified: Secondary | ICD-10-CM | POA: Diagnosis not present

## 2022-07-19 DIAGNOSIS — K5730 Diverticulosis of large intestine without perforation or abscess without bleeding: Secondary | ICD-10-CM | POA: Diagnosis not present

## 2022-07-19 DIAGNOSIS — C50919 Malignant neoplasm of unspecified site of unspecified female breast: Secondary | ICD-10-CM | POA: Diagnosis not present

## 2022-07-19 DIAGNOSIS — C7981 Secondary malignant neoplasm of breast: Secondary | ICD-10-CM | POA: Diagnosis not present

## 2022-07-19 MED ORDER — IOHEXOL 300 MG/ML  SOLN
100.0000 mL | Freq: Once | INTRAMUSCULAR | Status: AC | PRN
  Administered 2022-07-19: 100 mL via INTRAVENOUS

## 2022-07-19 MED ORDER — TECHNETIUM TC 99M MEDRONATE IV KIT
20.0000 | PACK | Freq: Once | INTRAVENOUS | Status: AC | PRN
  Administered 2022-07-19: 18.8 via INTRAVENOUS

## 2022-07-19 MED ORDER — IOHEXOL 9 MG/ML PO SOLN
ORAL | Status: AC
  Filled 2022-07-19: qty 1000

## 2022-07-19 MED ORDER — IOHEXOL 9 MG/ML PO SOLN
500.0000 mL | ORAL | Status: AC
  Administered 2022-07-19 (×2): 500 mL via ORAL

## 2022-07-20 ENCOUNTER — Encounter: Payer: Self-pay | Admitting: *Deleted

## 2022-07-21 ENCOUNTER — Telehealth: Payer: Self-pay | Admitting: *Deleted

## 2022-07-21 ENCOUNTER — Ambulatory Visit: Payer: BC Managed Care – PPO

## 2022-07-21 ENCOUNTER — Other Ambulatory Visit: Payer: BC Managed Care – PPO

## 2022-07-21 ENCOUNTER — Ambulatory Visit: Payer: BC Managed Care – PPO | Admitting: Hematology & Oncology

## 2022-07-21 NOTE — Telephone Encounter (Signed)
Call received from patient stating that she will not be able to come in today d/t new fever and vomiting that started yesterday.  Pt instructed to rest and drink plenty of water.  Message sent to scheduling to reschedule pt in one week.

## 2022-07-28 ENCOUNTER — Encounter: Payer: Self-pay | Admitting: Medical Oncology

## 2022-07-28 ENCOUNTER — Inpatient Hospital Stay: Payer: Medicare HMO | Attending: Hematology & Oncology

## 2022-07-28 ENCOUNTER — Inpatient Hospital Stay (HOSPITAL_BASED_OUTPATIENT_CLINIC_OR_DEPARTMENT_OTHER): Payer: Medicare HMO | Admitting: Medical Oncology

## 2022-07-28 ENCOUNTER — Inpatient Hospital Stay: Payer: Medicare HMO

## 2022-07-28 ENCOUNTER — Other Ambulatory Visit: Payer: Self-pay

## 2022-07-28 VITALS — BP 118/76 | HR 75 | Temp 97.7°F | Resp 18 | Ht 68.0 in | Wt 199.1 lb

## 2022-07-28 DIAGNOSIS — D709 Neutropenia, unspecified: Secondary | ICD-10-CM | POA: Insufficient documentation

## 2022-07-28 DIAGNOSIS — Z170 Estrogen receptor positive status [ER+]: Secondary | ICD-10-CM | POA: Diagnosis not present

## 2022-07-28 DIAGNOSIS — Z79818 Long term (current) use of other agents affecting estrogen receptors and estrogen levels: Secondary | ICD-10-CM | POA: Diagnosis not present

## 2022-07-28 DIAGNOSIS — D509 Iron deficiency anemia, unspecified: Secondary | ICD-10-CM | POA: Diagnosis not present

## 2022-07-28 DIAGNOSIS — Z79899 Other long term (current) drug therapy: Secondary | ICD-10-CM | POA: Diagnosis not present

## 2022-07-28 DIAGNOSIS — R42 Dizziness and giddiness: Secondary | ICD-10-CM | POA: Insufficient documentation

## 2022-07-28 DIAGNOSIS — C50919 Malignant neoplasm of unspecified site of unspecified female breast: Secondary | ICD-10-CM

## 2022-07-28 DIAGNOSIS — Z5111 Encounter for antineoplastic chemotherapy: Secondary | ICD-10-CM | POA: Diagnosis not present

## 2022-07-28 DIAGNOSIS — C7951 Secondary malignant neoplasm of bone: Secondary | ICD-10-CM

## 2022-07-28 DIAGNOSIS — C50011 Malignant neoplasm of nipple and areola, right female breast: Secondary | ICD-10-CM | POA: Insufficient documentation

## 2022-07-28 LAB — CBC WITH DIFFERENTIAL (CANCER CENTER ONLY)
Abs Immature Granulocytes: 0.01 10*3/uL (ref 0.00–0.07)
Basophils Absolute: 0.1 10*3/uL (ref 0.0–0.1)
Basophils Relative: 2 %
Eosinophils Absolute: 0 10*3/uL (ref 0.0–0.5)
Eosinophils Relative: 2 %
HCT: 35.8 % — ABNORMAL LOW (ref 36.0–46.0)
Hemoglobin: 12.3 g/dL (ref 12.0–15.0)
Immature Granulocytes: 0 %
Lymphocytes Relative: 40 %
Lymphs Abs: 1 10*3/uL (ref 0.7–4.0)
MCH: 35.4 pg — ABNORMAL HIGH (ref 26.0–34.0)
MCHC: 34.4 g/dL (ref 30.0–36.0)
MCV: 103.2 fL — ABNORMAL HIGH (ref 80.0–100.0)
Monocytes Absolute: 0.2 10*3/uL (ref 0.1–1.0)
Monocytes Relative: 6 %
Neutro Abs: 1.3 10*3/uL — ABNORMAL LOW (ref 1.7–7.7)
Neutrophils Relative %: 50 %
Platelet Count: 178 10*3/uL (ref 150–400)
RBC: 3.47 MIL/uL — ABNORMAL LOW (ref 3.87–5.11)
RDW: 15.7 % — ABNORMAL HIGH (ref 11.5–15.5)
WBC Count: 2.5 10*3/uL — ABNORMAL LOW (ref 4.0–10.5)
nRBC: 0 % (ref 0.0–0.2)

## 2022-07-28 LAB — CMP (CANCER CENTER ONLY)
ALT: 14 U/L (ref 0–44)
AST: 15 U/L (ref 15–41)
Albumin: 4.5 g/dL (ref 3.5–5.0)
Alkaline Phosphatase: 50 U/L (ref 38–126)
Anion gap: 9 (ref 5–15)
BUN: 15 mg/dL (ref 6–20)
CO2: 25 mmol/L (ref 22–32)
Calcium: 9.6 mg/dL (ref 8.9–10.3)
Chloride: 106 mmol/L (ref 98–111)
Creatinine: 1.03 mg/dL — ABNORMAL HIGH (ref 0.44–1.00)
GFR, Estimated: >60 mL/min (ref >60)
Glucose, Bld: 98 mg/dL (ref 70–99)
Potassium: 4 mmol/L (ref 3.5–5.1)
Sodium: 140 mmol/L (ref 135–145)
Total Bilirubin: 0.6 mg/dL (ref 0.3–1.2)
Total Protein: 7.1 g/dL (ref 6.5–8.1)

## 2022-07-28 LAB — IRON AND IRON BINDING CAPACITY (CC-WL,HP ONLY)
Iron: 123 ug/dL (ref 28–170)
Saturation Ratios: 30 % (ref 10.4–31.8)
TIBC: 413 ug/dL (ref 250–450)
UIBC: 290 ug/dL (ref 148–442)

## 2022-07-28 LAB — FERRITIN: Ferritin: 11 ng/mL (ref 11–307)

## 2022-07-28 LAB — LACTATE DEHYDROGENASE: LDH: 199 U/L — ABNORMAL HIGH (ref 98–192)

## 2022-07-28 MED ORDER — DENOSUMAB 120 MG/1.7ML ~~LOC~~ SOLN
120.0000 mg | Freq: Once | SUBCUTANEOUS | Status: AC
  Administered 2022-07-28: 120 mg via SUBCUTANEOUS
  Filled 2022-07-28: qty 1.7

## 2022-07-28 MED ORDER — FULVESTRANT 250 MG/5ML IM SOSY
500.0000 mg | PREFILLED_SYRINGE | INTRAMUSCULAR | Status: DC
  Administered 2022-07-28: 500 mg via INTRAMUSCULAR
  Filled 2022-07-28: qty 10

## 2022-07-28 NOTE — Patient Instructions (Signed)
Fulvestrant Injection What is this medication? FULVESTRANT (ful VES trant) treats breast cancer. It works by blocking the hormone estrogen in breast tissue, which prevents breast cancer cells from spreading or growing. This medicine may be used for other purposes; ask your health care provider or pharmacist if you have questions. COMMON BRAND NAME(S): FASLODEX What should I tell my care team before I take this medication? They need to know if you have any of these conditions: Bleeding disorder Liver disease Low blood cell levels, such as low white cells, red cells, and platelets An unusual or allergic reaction to fulvestrant, other medications, foods, dyes, or preservatives Pregnant or trying to get pregnant Breast-feeding How should I use this medication? This medication is injected into a muscle. It is given by your care team in a hospital or clinic setting. Talk to your care team about the use of this medication in children. Special care may be needed. Overdosage: If you think you have taken too much of this medicine contact a poison control center or emergency room at once. NOTE: This medicine is only for you. Do not share this medicine with others. What if I miss a dose? Keep appointments for follow-up doses. It is important not to miss your dose. Call your care team if you are unable to keep an appointment. What may interact with this medication? Certain medications that prevent or treat blood clots, such as warfarin, enoxaparin, dalteparin, apixaban, dabigatran, rivaroxaban This list may not describe all possible interactions. Give your health care provider a list of all the medicines, herbs, non-prescription drugs, or dietary supplements you use. Also tell them if you smoke, drink alcohol, or use illegal drugs. Some items may interact with your medicine. What should I watch for while using this medication? Your condition will be monitored carefully while you are receiving this  medication. You may need blood work while taking this medication. Talk to your care team if you may be pregnant. Serious birth defects can occur if you take this medication during pregnancy and for 1 year after the last dose. You will need a negative pregnancy test before starting this medication. Contraception is recommended while taking this medication and for 1 year after the last dose. Your care team can help you find the option that works for you. Do not breastfeed while taking this medication and for 1 year after the last dose. This medication may cause infertility. Talk to your care team if you are concerned about your fertility. What side effects may I notice from receiving this medication? Side effects that you should report to your care team as soon as possible: Allergic reactions or angioedema--skin rash, itching or hives, swelling of the face, eyes, lips, tongue, arms, or legs, trouble swallowing or breathing Pain, tingling, or numbness in the hands or feet Side effects that usually do not require medical attention (report to your care team if they continue or are bothersome): Bone, joint, or muscle pain Constipation Headache Hot flashes Nausea Pain, redness, or irritation at injection site Unusual weakness or fatigue This list may not describe all possible side effects. Call your doctor for medical advice about side effects. You may report side effects to FDA at 1-800-FDA-1088. Where should I keep my medication? This medication is given in a hospital or clinic. It will not be stored at home. NOTE: This sheet is a summary. It may not cover all possible information. If you have questions about this medicine, talk to your doctor, pharmacist, or health care provider.    2023 Elsevier/Gold Standard (2007-06-16 00:00:00) Denosumab Injection (Oncology) What is this medication? DENOSUMAB (den oh SUE mab) prevents weakened bones caused by cancer. It may also be used to treat noncancerous  bone tumors that cannot be removed by surgery. It can also be used to treat high calcium levels in the blood caused by cancer. It works by blocking a protein that causes bones to break down quickly. This slows down the release of calcium from bones, which lowers calcium levels in your blood. It also makes your bones stronger and less likely to break (fracture). This medicine may be used for other purposes; ask your health care provider or pharmacist if you have questions. COMMON BRAND NAME(S): XGEVA What should I tell my care team before I take this medication? They need to know if you have any of these conditions: Dental disease Having surgery or tooth extraction Infection Kidney disease Low levels of calcium or vitamin D in the blood Malnutrition On hemodialysis Skin conditions or sensitivity Thyroid or parathyroid disease An unusual reaction to denosumab, other medications, foods, dyes, or preservatives Pregnant or trying to get pregnant Breast-feeding How should I use this medication? This medication is for injection under the skin. It is given by your care team in a hospital or clinic setting. A special MedGuide will be given to you before each treatment. Be sure to read this information carefully each time. Talk to your care team about the use of this medication in children. While it may be prescribed for children as young as 13 years for selected conditions, precautions do apply. Overdosage: If you think you have taken too much of this medicine contact a poison control center or emergency room at once. NOTE: This medicine is only for you. Do not share this medicine with others. What if I miss a dose? Keep appointments for follow-up doses. It is important not to miss your dose. Call your care team if you are unable to keep an appointment. What may interact with this medication? Do not take this medication with any of the following: Other medications containing denosumab This  medication may also interact with the following: Medications that lower your chance of fighting infection Steroid medications, such as prednisone or cortisone This list may not describe all possible interactions. Give your health care provider a list of all the medicines, herbs, non-prescription drugs, or dietary supplements you use. Also tell them if you smoke, drink alcohol, or use illegal drugs. Some items may interact with your medicine. What should I watch for while using this medication? Your condition will be monitored carefully while you are receiving this medication. You may need blood work while taking this medication. This medication may increase your risk of getting an infection. Call your care team for advice if you get a fever, chills, sore throat, or other symptoms of a cold or flu. Do not treat yourself. Try to avoid being around people who are sick. You should make sure you get enough calcium and vitamin D while you are taking this medication, unless your care team tells you not to. Discuss the foods you eat and the vitamins you take with your care team. Some people who take this medication have severe bone, joint, or muscle pain. This medication may also increase your risk for jaw problems or a broken thigh bone. Tell your care team right away if you have severe pain in your jaw, bones, joints, or muscles. Tell your care team if you have any pain that does not go  away or that gets worse. Talk to your care team if you may be pregnant. Serious birth defects can occur if you take this medication during pregnancy and for 5 months after the last dose. You will need a negative pregnancy test before starting this medication. Contraception is recommended while taking this medication and for 5 months after the last dose. Your care team can help you find the option that works for you. What side effects may I notice from receiving this medication? Side effects that you should report to your care  team as soon as possible: Allergic reactions--skin rash, itching, hives, swelling of the face, lips, tongue, or throat Bone, joint, or muscle pain Low calcium level--muscle pain or cramps, confusion, tingling, or numbness in the hands or feet Osteonecrosis of the jaw--pain, swelling, or redness in the mouth, numbness of the jaw, poor healing after dental work, unusual discharge from the mouth, visible bones in the mouth Side effects that usually do not require medical attention (report to your care team if they continue or are bothersome): Cough Diarrhea Fatigue Headache Nausea This list may not describe all possible side effects. Call your doctor for medical advice about side effects. You may report side effects to FDA at 1-800-FDA-1088. Where should I keep my medication? This medication is given in a hospital or clinic. It will not be stored at home. NOTE: This sheet is a summary. It may not cover all possible information. If you have questions about this medicine, talk to your doctor, pharmacist, or health care provider.  2023 Elsevier/Gold Standard (2021-09-13 00:00:00)

## 2022-07-28 NOTE — Progress Notes (Signed)
Hematology and Oncology Follow Up Visit  Rickelle Mcgibbon [id number] [birth year] 57 y.o. 07/29/2022   Principle Diagnosis:  Metastatic breast cancer-ER positive/HER-2 negative --bone metastasis only Iron deficiency anemia  Current Therapy:   Faslodex 500 mg IM monthly --start on 04/2020 Ibrance 100 mg p.o. daily (21d on/7d off) - start on 04/2020 Xgeva 120 mg subcu every 3 months -next dose 07/2022 IV iron-Feraheme given on 09/14/2021     Interim History:  Ms. Barlow is in for follow-up.   Since her last visit she reports that she has continued to be stressed over her sister who was diagnosed with spontaneous CJD. This has been hard on the whole family and especially Latora as she is very close with her sister.   In good news though, her CA 27.29 level had dropped from 52.7 to 47.2 which is reassuring. She also had recent CT imaging which showed no new or progressive changes.   She has had no change in bowel or bladder habits.  There is been no melena or hematochezia. Weight is stable.   Her dizziness is chronic and tied to positional in nature relating to her right ear. She denies headaches, double vision, visual changes.   No planned dental events.   Her last iron studies that we did back in December did show an iron saturation of 10%.  Because her hemoglobin was doing well, we did not give her any IV iron.  Currently, I would have said that her performance status is probably ECOG 1. . Wt Readings from Last 3 Encounters:  07/28/22 199 lb 1.9 oz (90.3 kg)  06/22/22 201 lb (91.2 kg)  05/24/22 202 lb (91.6 kg)    Medications:  Current Outpatient Medications:    acetaminophen (TYLENOL) 650 MG CR tablet, Take 650 mg by mouth every 8 (eight) hours as needed for pain., Disp: , Rfl:    calcium carbonate (OS-CAL) 600 MG tablet, Take 1 tablet (600 mg total) by mouth 2 (two) times daily., Disp: 30 tablet, Rfl: 0   Ferrous Sulfate (IRON) 325 (65 Fe) MG TABS, Take 1 tablet (325 mg total)  by mouth daily at 2 PM., Disp: 30 tablet, Rfl: 0   FLUoxetine (PROZAC) 20 MG capsule, TAKE 3 CAPSULES(60 MG) BY MOUTH DAILY, Disp: 30 capsule, Rfl: 0   fulvestrant (FASLODEX) 250 MG/5ML injection, Inject 500 mg into the muscle every 30 (thirty) days. One injection each buttock over 1-2 minutes. Warm prior to use., Disp: , Rfl:    IBRANCE 100 MG tablet, TAKE 1 TABLET DAILY ON DAYS 1 THROUGH 21 OF CYCLE, 7 DAYS OFF. REPEAT, Disp: 21 tablet, Rfl: 0   oxyCODONE (OXY IR/ROXICODONE) 5 MG immediate release tablet, Take 1 tablet (5 mg total) by mouth every 8 (eight) hours as needed for severe pain., Disp: 50 tablet, Rfl: 0   pantoprazole (PROTONIX) 40 MG tablet, Take 1 tablet (40 mg total) by mouth 2 (two) times daily., Disp: 60 tablet, Rfl: 2   vitamin B-12 (CYANOCOBALAMIN) 1000 MCG tablet, Take 1 tablet (1,000 mcg total) by mouth daily., Disp: 30 tablet, Rfl: 0   Wheat Dextrin (BENEFIBER DRINK MIX PO), Take by mouth daily at 6 (six) AM., Disp: , Rfl:   Allergies:  Allergies  Allergen Reactions   Amoxicillin-Pot Clavulanate Hives and Itching   Iron Anaphylaxis and Other (See Comments)    Patient develops hypotension, resp distress after infusion of feraheme.    Penicillins Hives, Itching and Other (See Comments)   Codeine Nausea And Vomiting  Needs pre-meds     Past Medical History, Surgical history, Social history, and Family History were reviewed and updated.  Review of Systems: Review of Systems  Constitutional:  Negative for appetite change.  HENT:  Negative.    Eyes: Negative.   Respiratory: Negative.    Cardiovascular: Negative.   Gastrointestinal:  Negative for abdominal pain and nausea.  Endocrine: Negative.   Genitourinary: Negative.    Musculoskeletal:  Positive for arthralgias and back pain.  Skin: Negative.   Neurological:  Positive for dizziness.  Hematological: Negative.   Psychiatric/Behavioral: Negative.      Physical Exam:  height is 5\' 8"  (1.727 m) and weight is  199 lb 1.9 oz (90.3 kg). Her oral temperature is 97.7 F (36.5 C). Her blood pressure is 118/76 and her pulse is 75. Her respiration is 18 and oxygen saturation is 100%.   Wt Readings from Last 3 Encounters:  07/28/22 199 lb 1.9 oz (90.3 kg)  06/22/22 201 lb (91.2 kg)  05/24/22 202 lb (91.6 kg)    Physical Exam Vitals reviewed.  HENT:     Head: Normocephalic and atraumatic.  Eyes:     Pupils: Pupils are equal, round, and reactive to light.  Cardiovascular:     Rate and Rhythm: Normal rate and regular rhythm.     Heart sounds: Normal heart sounds.  Pulmonary:     Effort: Pulmonary effort is normal.     Breath sounds: Normal breath sounds.  Abdominal:     General: Bowel sounds are normal.     Palpations: Abdomen is soft.  Musculoskeletal:        General: No tenderness or deformity. Normal range of motion.     Cervical back: Normal range of motion.  Lymphadenopathy:     Cervical: No cervical adenopathy.  Skin:    General: Skin is warm and dry.     Findings: No erythema or rash.  Neurological:     Mental Status: She is alert and oriented to person, place, and time.  Psychiatric:        Behavior: Behavior normal.        Thought Content: Thought content normal.        Judgment: Judgment normal.      Lab Results  Component Value Date   WBC 2.5 (L) 07/28/2022   HGB 12.3 07/28/2022   HCT 35.8 (L) 07/28/2022   MCV 103.2 (H) 07/28/2022   PLT 178 07/28/2022     Chemistry      Component Value Date/Time   NA 140 07/28/2022 1229   K 4.0 07/28/2022 1229   CL 106 07/28/2022 1229   CO2 25 07/28/2022 1229   BUN 15 07/28/2022 1229   CREATININE 1.03 (H) 07/28/2022 1229   CREATININE 0.88 10/22/2019 1246      Component Value Date/Time   CALCIUM 9.6 07/28/2022 1229   ALKPHOS 50 07/28/2022 1229   AST 15 07/28/2022 1229   ALT 14 07/28/2022 1229   BILITOT 0.6 07/28/2022 1229     Encounter Diagnoses  Name Primary?   Malignant neoplasm of female breast, unspecified estrogen  receptor status, unspecified laterality, unspecified site of breast ([place]) Yes   Metastatic cancer to bone [facility])     Impression and Plan: Ms. Moroney is a very charming 57 year old postmenopausal female with metastatic breast cancer.  We actually had seen her many years ago with a carcinoma in situ.  Mild neutropenia likely secondary to her Ibrance- WBC 2.5, ANC 1.3. Platelets and hemoglobin are stable.  She is due for her Delton See today along with her Faslodex.   RTC 1 month MD, labs Faslodex

## 2022-07-29 ENCOUNTER — Encounter: Payer: Self-pay | Admitting: Hematology & Oncology

## 2022-07-29 LAB — CANCER ANTIGEN 27.29: CA 27.29: 42.6 U/mL — ABNORMAL HIGH (ref 0.0–38.6)

## 2022-08-02 ENCOUNTER — Ambulatory Visit: Payer: Medicare HMO | Admitting: Internal Medicine

## 2022-08-02 VITALS — BP 118/64 | HR 79 | Temp 98.0°F | Ht 68.0 in | Wt 202.0 lb

## 2022-08-02 DIAGNOSIS — R69 Illness, unspecified: Secondary | ICD-10-CM | POA: Diagnosis not present

## 2022-08-02 DIAGNOSIS — Z0001 Encounter for general adult medical examination with abnormal findings: Secondary | ICD-10-CM

## 2022-08-02 DIAGNOSIS — E7800 Pure hypercholesterolemia, unspecified: Secondary | ICD-10-CM | POA: Diagnosis not present

## 2022-08-02 DIAGNOSIS — F32A Depression, unspecified: Secondary | ICD-10-CM

## 2022-08-02 DIAGNOSIS — E538 Deficiency of other specified B group vitamins: Secondary | ICD-10-CM

## 2022-08-02 DIAGNOSIS — K219 Gastro-esophageal reflux disease without esophagitis: Secondary | ICD-10-CM | POA: Diagnosis not present

## 2022-08-02 MED ORDER — FLUOXETINE HCL 20 MG PO CAPS: ORAL_CAPSULE | ORAL | 3 refills | Status: DC

## 2022-08-02 NOTE — Progress Notes (Signed)
Patient ID: Caitlin Greene, female   DOB: 1965/09/19, 57 y.o.   MRN: [id number]         Chief Complaint:: wellness exam and low b12, depression, hld, gerd       HPI:  Caitlin Greene is a 57 y.o. female here for wellness exam; for shingrix at the pharmacy; to see GYN for routine care and soon, declines flu shot, covid booster, does have some residual left breast tissue and will f/u with GYN regarding need for further mammogram, o/w up to date                        Also mentions CJD now diagnosed in sister.  Pt currently stage 4 malignancy breast ca.  Pt denies chest pain, increased sob or doe, wheezing, orthopnea, PND, increased LE swelling, palpitations, dizziness or syncope.   Pt denies polydipsia, polyuria, or new focal neuro s/s.    Pt denies fever, wt loss, night sweats, loss of appetite, or other constitutional symptoms  Denies worsening depressive symptoms, suicidal ideation, or panic, akss for med refill.  Denies worsening reflux, abd pain, dysphagia, n/v, bowel change or blood.   Wt Readings from Last 3 Encounters:  08/02/22 202 lb (91.6 kg)  07/28/22 199 lb 1.9 oz (90.3 kg)  06/22/22 201 lb (91.2 kg)   BP Readings from Last 3 Encounters:  08/02/22 118/64  07/28/22 118/76  06/22/22 116/83   Immunization History  Administered Date(s) Administered   Influenza Split 03/17/2011   Influenza Whole 03/02/2009   Influenza, Seasonal, Injecte, Preservative Fre 04/07/2013   Influenza,inj,Quad PF,6+ Mos 04/08/2014, 01/22/2015, 03/23/2017, 02/25/2020, 03/27/2020   Influenza-Unspecified 02/19/2016, 03/23/2018   PFIZER(Purple Top)SARS-COV-2 Vaccination 07/29/2019, 08/26/2019, 02/07/2020   Pfizer Covid-19 Vaccine Bivalent Booster 47yrs & up 03/15/2021   Pneumococcal Conjugate-13 07/08/2014   Pneumococcal Polysaccharide-23 03/28/2022   Td 05/31/2010   Tdap 11/10/2016   Health Maintenance Due  Topic Date Due   PAP SMEAR-Modifier  08/07/2012      Past Medical History:  Diagnosis Date    ALLERGIC RHINITIS    ANEMIA-IRON DEFICIENCY    ANXIETY    BREAST CANCER, HX OF 01/21/2007   at 57yo   DEPRESSION    GERD    HYPERLIPIDEMIA    Metastatic cancer to bone ([place]) dx'd 10/24/2019   recurrent breast ca   Pneumonia 02/2022   RENAL CALCULUS    rt breast ca dx'd 1988   Past Surgical History:  Procedure Laterality Date   BIOPSY  03/21/2020   Procedure: BIOPSY;  Surgeon: Thornton Park, MD;  Location: [facility];  Service: Gastroenterology;;   BIOPSY  09/13/2021   Procedure: BIOPSY;  Surgeon: Milus Banister, MD;  Location: [facility];  Service: Gastroenterology;;   BIOPSY  10/19/2021   Procedure: BIOPSY;  Surgeon: Thornton Park, MD;  Location: [facility];  Service: Gastroenterology;;   BREAST ENHANCEMENT SURGERY     COLONOSCOPY WITH PROPOFOL N/A 10/19/2021   Procedure: COLONOSCOPY WITH PROPOFOL;  Surgeon: Thornton Park, MD;  Location: [facility];  Service: Gastroenterology;  Laterality: N/A;   ENTEROSCOPY N/A 10/19/2021   Procedure: ENTEROSCOPY;  Surgeon: Thornton Park, MD;  Location: [facility];  Service: Gastroenterology;  Laterality: N/A;   ESOPHAGOGASTRODUODENOSCOPY N/A 10/16/2021   Procedure: ESOPHAGOGASTRODUODENOSCOPY (EGD);  Surgeon: Daryel November, MD;  Location: [facility];  Service: Gastroenterology;  Laterality: N/A;   ESOPHAGOGASTRODUODENOSCOPY (EGD) WITH PROPOFOL N/A 03/21/2020   Procedure: ESOPHAGOGASTRODUODENOSCOPY (EGD) WITH PROPOFOL;  Surgeon: Thornton Park, MD;  Location: [facility];  Service: Gastroenterology;  Laterality: N/A;   ESOPHAGOGASTRODUODENOSCOPY (EGD) WITH PROPOFOL N/A 09/13/2021   Procedure: ESOPHAGOGASTRODUODENOSCOPY (EGD) WITH PROPOFOL;  Surgeon: Milus Banister, MD;  Location: [facility];  Service: Gastroenterology;  Laterality: N/A;   MASTECTOMY     right   POLYPECTOMY  10/19/2021   Procedure: POLYPECTOMY;  Surgeon: Thornton Park, MD;  Location: [facility];  Service: Gastroenterology;;    TEMPOROMANDIBULAR JOINT SURGERY     Left   TONSILLECTOMY      reports that she has never smoked. She has never used smokeless tobacco. She reports that she does not drink alcohol and does not use drugs. family history includes Asthma in her sister; COPD in her father; Cancer in her mother; Colon polyps in her father and mother; Dementia in her mother; Diabetes in an other family member; Hypertension in her mother and another family member. Allergies  Allergen Reactions   Amoxicillin-Pot Clavulanate Hives and Itching   Iron Anaphylaxis and Other (See Comments)    Patient develops hypotension, resp distress after infusion of feraheme.    Penicillins Hives, Itching and Other (See Comments)   Codeine Nausea And Vomiting    Needs pre-meds    Current Outpatient Medications on File Prior to Visit  Medication Sig Dispense Refill   acetaminophen (TYLENOL) 650 MG CR tablet Take 650 mg by mouth every 8 (eight) hours as needed for pain.     calcium carbonate (OS-CAL) 600 MG tablet Take 1 tablet (600 mg total) by mouth 2 (two) times daily. 30 tablet 0   Ferrous Sulfate (IRON) 325 (65 Fe) MG TABS Take 1 tablet (325 mg total) by mouth daily at 2 PM. 30 tablet 0   fulvestrant (FASLODEX) 250 MG/5ML injection Inject 500 mg into the muscle every 30 (thirty) days. One injection each buttock over 1-2 minutes. Warm prior to use.     IBRANCE 100 MG tablet TAKE 1 TABLET DAILY ON DAYS 1 THROUGH 21 OF CYCLE, 7 DAYS OFF. REPEAT 21 tablet 0   oxyCODONE (OXY IR/ROXICODONE) 5 MG immediate release tablet Take 1 tablet (5 mg total) by mouth every 8 (eight) hours as needed for severe pain. 50 tablet 0   pantoprazole (PROTONIX) 40 MG tablet Take 1 tablet (40 mg total) by mouth 2 (two) times daily. 60 tablet 2   vitamin B-12 (CYANOCOBALAMIN) 1000 MCG tablet Take 1 tablet (1,000 mcg total) by mouth daily. 30 tablet 0   Wheat Dextrin (BENEFIBER DRINK MIX PO) Take by mouth daily at 6 (six) AM.     No current  facility-administered medications on file prior to visit.        ROS:  All others reviewed and negative.  Objective        PE:  BP 118/64   Pulse 79   Temp 98 F (36.7 C) (Oral)   Ht 5\' 8"  (1.727 m)   Wt 202 lb (91.6 kg)   LMP 06/09/2017 (Exact Date)   SpO2 99%   BMI 30.71 kg/m                 Constitutional: Pt appears in NAD               HENT: Head: NCAT.                Right Ear: External ear normal.                 Left Ear: External ear normal.  Eyes: . Pupils are equal, round, and reactive to light. Conjunctivae and EOM are normal               Nose: without d/c or deformity               Neck: Neck supple. Gross normal ROM               Cardiovascular: Normal rate and regular rhythm.                 Pulmonary/Chest: Effort normal and breath sounds without rales or wheezing.                Abd:  Soft, NT, ND, + BS, no organomegaly               Neurological: Pt is alert. At baseline orientation, motor grossly intact               Skin: Skin is warm. No rashes, no other new lesions, LE edema - none               Psychiatric: Pt behavior is normal without agitation   Micro: none  Cardiac tracings I have personally interpreted today:  none  Pertinent Radiological findings (summarize): none   Lab Results  Component Value Date   WBC 2.5 (L) 07/28/2022   HGB 12.3 07/28/2022   HCT 35.8 (L) 07/28/2022   PLT 178 07/28/2022   GLUCOSE 98 07/28/2022   CHOL 212 (H) 04/12/2018   TRIG 229.0 (H) 04/12/2018   HDL 52.00 04/12/2018   LDLDIRECT 141.0 04/12/2018   LDLCALC 129 (H) 04/12/2013   ALT 14 07/28/2022   AST 15 07/28/2022   NA 140 07/28/2022   K 4.0 07/28/2022   CL 106 07/28/2022   CREATININE 1.03 (H) 07/28/2022   BUN 15 07/28/2022   CO2 25 07/28/2022   TSH 4.846 (H) 10/15/2021   INR 1.1 10/16/2021   HGBA1C 4.3 (L) 10/15/2021   Assessment/Plan:  Caitlin Greene is a 57 y.o. White or Caucasian [1] female with  has a past medical history of ALLERGIC  RHINITIS, ANEMIA-IRON DEFICIENCY, ANXIETY, BREAST CANCER, HX OF (01/21/2007), DEPRESSION, GERD, HYPERLIPIDEMIA, Metastatic cancer to bone ([place]) (dx'd 10/24/2019), Pneumonia (02/2022), RENAL CALCULUS, and rt breast ca (dx'd 1988).  Encounter for well adult exam with abnormal findings Age and sex appropriate education and counseling updated with regular exercise and diet Referrals for preventative services - pt to f/u Gyn appt soon Immunizations addressed - for shingrix at pharmacy, decliens covid booster Smoking counseling  - none needed Evidence for depression or other mood disorder - stable anxiety depression, for prozac refill Most recent labs reviewed. I have personally reviewed and have noted: 1) the patient's medical and social history 2) The patient's current medications and supplements 3) The patient's height, weight, and BMI have been recorded in the chart   B12 deficiency Lab Results  Component Value Date   VITAMINB12 337 02/01/2022   Stable, cont oral replacement - b12 1000 mcg qd   Depression Stable overall, cont prozac 60 mg qd  HLD (hyperlipidemia) Lab Results  Component Value Date   LDLCALC 129 (H) 04/12/2013   uncontrolled, pt to continue lower chol diet, declines statin   GERD (gastroesophageal reflux disease) Stable overall, cont Protonix 40 qd  Followup: Return in about 6 months (around 02/02/2023).  Cathlean Cower, MD 08/05/2022 7:26 AM [facility]

## 2022-08-02 NOTE — Patient Instructions (Signed)
constipationPlease continue all other medications as before, and refills have been done if requested.  Please have the pharmacy call with any other refills you may need.  Please continue your efforts at being more active, low cholesterol diet, and weight control.  You are otherwise up to date with prevention measures today.  Please keep your appointments with your specialists as you may have planned  No other lab work needed today  Please make an Appointment to return in 6 months, or sooner if needed

## 2022-08-05 ENCOUNTER — Encounter: Payer: Self-pay | Admitting: Internal Medicine

## 2022-08-05 NOTE — Assessment & Plan Note (Signed)
Lab Results  Component Value Date   [id number] 02/01/2022   Stable, cont oral replacement - b12 1000 mcg qd

## 2022-08-05 NOTE — Assessment & Plan Note (Signed)
Stable overall, cont prozac 60 mg qd

## 2022-08-05 NOTE — Assessment & Plan Note (Signed)
Stable overall, cont Protonix 40 qd

## 2022-08-05 NOTE — Assessment & Plan Note (Signed)
Age and sex appropriate education and counseling updated with regular exercise and diet Referrals for preventative services - pt to f/u Gyn appt soon Immunizations addressed - for shingrix at pharmacy, decliens covid booster Smoking counseling  - none needed Evidence for depression or other mood disorder - stable anxiety depression, for prozac refill Most recent labs reviewed. I have personally reviewed and have noted: 1) the patient's medical and social history 2) The patient's current medications and supplements 3) The patient's height, weight, and BMI have been recorded in the chart

## 2022-08-05 NOTE — Assessment & Plan Note (Signed)
Lab Results  Component Value Date   LDLCALC 129 (H) 04/12/2013   uncontrolled, pt to continue lower chol diet, declines statin

## 2022-08-28 ENCOUNTER — Other Ambulatory Visit: Payer: Self-pay

## 2022-08-28 ENCOUNTER — Emergency Department (HOSPITAL_COMMUNITY): Payer: Medicare HMO

## 2022-08-28 ENCOUNTER — Emergency Department (HOSPITAL_COMMUNITY)
Admission: EM | Admit: 2022-08-28 | Discharge: 2022-08-28 | Disposition: A | Discharge status: Home / Self Care | Payer: Medicare HMO | Attending: Emergency Medicine | Admitting: Emergency Medicine

## 2022-08-28 ENCOUNTER — Encounter (HOSPITAL_COMMUNITY): Payer: Self-pay

## 2022-08-28 DIAGNOSIS — R58 Hemorrhage, not elsewhere classified: Secondary | ICD-10-CM | POA: Diagnosis not present

## 2022-08-28 DIAGNOSIS — R197 Diarrhea, unspecified: Secondary | ICD-10-CM | POA: Diagnosis not present

## 2022-08-28 DIAGNOSIS — K921 Melena: Secondary | ICD-10-CM | POA: Diagnosis not present

## 2022-08-28 DIAGNOSIS — Z853 Personal history of malignant neoplasm of breast: Secondary | ICD-10-CM | POA: Insufficient documentation

## 2022-08-28 DIAGNOSIS — K922 Gastrointestinal hemorrhage, unspecified: Secondary | ICD-10-CM | POA: Diagnosis not present

## 2022-08-28 DIAGNOSIS — K449 Diaphragmatic hernia without obstruction or gangrene: Secondary | ICD-10-CM | POA: Diagnosis not present

## 2022-08-28 DIAGNOSIS — R9431 Abnormal electrocardiogram [ECG] [EKG]: Secondary | ICD-10-CM | POA: Diagnosis not present

## 2022-08-28 DIAGNOSIS — K920 Hematemesis: Secondary | ICD-10-CM | POA: Diagnosis not present

## 2022-08-28 DIAGNOSIS — R1084 Generalized abdominal pain: Secondary | ICD-10-CM | POA: Diagnosis not present

## 2022-08-28 LAB — CBC WITH DIFFERENTIAL/PLATELET
Abs Immature Granulocytes: 0.17 10*3/uL — ABNORMAL HIGH (ref 0.00–0.07)
Basophils Absolute: 0 10*3/uL (ref 0.0–0.1)
Basophils Relative: 0 %
Eosinophils Absolute: 0.1 10*3/uL (ref 0.0–0.5)
Eosinophils Relative: 1 %
HCT: 35.9 % — ABNORMAL LOW (ref 36.0–46.0)
Hemoglobin: 12.4 g/dL (ref 12.0–15.0)
Immature Granulocytes: 2 %
Lymphocytes Relative: 5 %
Lymphs Abs: 0.5 10*3/uL — ABNORMAL LOW (ref 0.7–4.0)
MCH: 34.6 pg — ABNORMAL HIGH (ref 26.0–34.0)
MCHC: 34.5 g/dL (ref 30.0–36.0)
MCV: 100.3 fL — ABNORMAL HIGH (ref 80.0–100.0)
Monocytes Absolute: 0.7 10*3/uL (ref 0.1–1.0)
Monocytes Relative: 7 %
Neutro Abs: 8.1 10*3/uL — ABNORMAL HIGH (ref 1.7–7.7)
Neutrophils Relative %: 85 %
Platelets: 233 10*3/uL (ref 150–400)
RBC: 3.58 MIL/uL — ABNORMAL LOW (ref 3.87–5.11)
RDW: 13.9 % (ref 11.5–15.5)
WBC: 9.6 10*3/uL (ref 4.0–10.5)
nRBC: 0 % (ref 0.0–0.2)

## 2022-08-28 LAB — COMPREHENSIVE METABOLIC PANEL
ALT: 32 U/L (ref 0–44)
AST: 24 U/L (ref 15–41)
Albumin: 3.8 g/dL (ref 3.5–5.0)
Alkaline Phosphatase: 51 U/L (ref 38–126)
Anion gap: 10 (ref 5–15)
BUN: 15 mg/dL (ref 6–20)
CO2: 22 mmol/L (ref 22–32)
Calcium: 9 mg/dL (ref 8.9–10.3)
Chloride: 104 mmol/L (ref 98–111)
Creatinine, Ser: 0.96 mg/dL (ref 0.44–1.00)
GFR, Estimated: >60 mL/min (ref >60)
Glucose, Bld: 93 mg/dL (ref 70–99)
Potassium: 3.4 mmol/L — ABNORMAL LOW (ref 3.5–5.1)
Sodium: 136 mmol/L (ref 135–145)
Total Bilirubin: 0.6 mg/dL (ref 0.3–1.2)
Total Protein: 6.6 g/dL (ref 6.5–8.1)

## 2022-08-28 LAB — I-STAT CHEM 8, ED
BUN: 18 mg/dL (ref 6–20)
Calcium, Ion: 1.17 mmol/L (ref 1.15–1.40)
Chloride: 106 mmol/L (ref 98–111)
Creatinine, Ser: 0.9 mg/dL (ref 0.44–1.00)
Glucose, Bld: 93 mg/dL (ref 70–99)
HCT: 36 % (ref 36.0–46.0)
Hemoglobin: 12.2 g/dL (ref 12.0–15.0)
Potassium: 3.5 mmol/L (ref 3.5–5.1)
Sodium: 141 mmol/L (ref 135–145)
TCO2: 23 mmol/L (ref 22–32)

## 2022-08-28 LAB — POC OCCULT BLOOD, ED: Fecal Occult Bld: NEGATIVE

## 2022-08-28 LAB — PROTIME-INR
INR: 1.1 (ref 0.8–1.2)
Prothrombin Time: 13.9 seconds (ref 11.4–15.2)

## 2022-08-28 LAB — HEMOGLOBIN AND HEMATOCRIT, BLOOD
HCT: 35.3 % — ABNORMAL LOW (ref 36.0–46.0)
Hemoglobin: 11.9 g/dL — ABNORMAL LOW (ref 12.0–15.0)

## 2022-08-28 LAB — TYPE AND SCREEN
ABO/RH(D): O POS
Antibody Screen: NEGATIVE

## 2022-08-28 LAB — LIPASE, BLOOD: Lipase: 26 U/L (ref 11–51)

## 2022-08-28 MED ORDER — PANTOPRAZOLE SODIUM 40 MG IV SOLR
40.0000 mg | Freq: Once | INTRAVENOUS | Status: AC
  Administered 2022-08-28: 40 mg via INTRAVENOUS
  Filled 2022-08-28: qty 10

## 2022-08-28 MED ORDER — ONDANSETRON HCL 4 MG PO TABS: 4.0000 mg | ORAL_TABLET | Freq: Three times a day (TID) | ORAL | 0 refills | Status: AC | PRN

## 2022-08-28 NOTE — ED Provider Triage Note (Signed)
Emergency Medicine Provider Triage Evaluation Note  Caitlin Greene , a 57 y.o. female  was evaluated in triage.  Pt complains of headache vomiting and melena   Review of Systems  Positive: Vomiting and diarrhea  Negative: Fever   Physical Exam  LMP 06/09/2017 (Exact Date)   SpO2 96%  Gen:   Awake, no distress   Resp:  Normal effort  MSK:   Moves extremities without difficulty  Other:  AO3  Medical Decision Making  Medically screening exam initiated at 6:11 AM.  Appropriate orders placed.  Caitlin Greene was informed that the remainder of the evaluation will be completed by another provider, this initial triage assessment does not replace that evaluation, and the importance of remaining in the ED until their evaluation is complete.     Caitlin Espey, MD 08/28/22 [phone]

## 2022-08-28 NOTE — Discharge Instructions (Signed)
Thank you for letting us take care of you today.  Overall, your blood work was reassuring.  Your blood counts are stable.  We do not need to transfuse you today.  We talked to the GI specialist and they want you to call the office tomorrow to set up a follow-up appointment.  Please call Dr. Elnoria Howard as above to schedule this appointment.  Continue to take your pantoprazole at home.  I am also prescribing a small amount of Zofran for you to take to help control your nausea until your follow-up.   For any new or worsening symptoms such as severe abdominal pain, shortness of breath, chest pain, recurrence of significant bleeding, or other new, concerning symptoms, please return to the nearest emergency department for reevaluation.

## 2022-08-28 NOTE — ED Triage Notes (Signed)
Patient c/o coffee ground emesis and black tarry stools since 4 pm on yesterday. Patient reports last episode of emesis was around 0400

## 2022-08-28 NOTE — ED Provider Notes (Signed)
[place] EMERGENCY DEPARTMENT AT [facility] Provider Note   CSN: [id number] Arrival date & time: 08/28/22  1191     History  Chief Complaint  Patient presents with   Emesis   Diarrhea    Caitlin Greene is a 57 y.o. female with past medical history metastatic breast cancer, iron deficiency anemia, hyperlipidemia who presents to the ED complaining of hematemesis and melena.  She states that last night she started having coffee-ground emesis.  Reports that she had multiple episodes of this for the last around 4 AM this morning.  Following this, she had onset of black tarry stools which she has had a few episodes of.  No more episodes of this since last night.  Minimal associated epigastric pain.  Patient has a history of upper GI bleed and reports symptoms are similar.  No recent fever, chest pain, shortness of breath.  Patient does not take an coagulants.  Last endoscopy reported to be December 2023.  Patient states that this showed large hiatal hernia. Per chart review, endoscopy showed diverticulosis, erythematous friable ulcerated mucosa in sigmoid colon, hiatal hernia with Cameron's lesions. Pt on Protonix daily -- has not had daily dose today.  She received Zofran from EMS on the way to the ED for further evaluation and reports resolution of nausea.  She is followed by [facility].      Home Medications Prior to Admission medications   Medication Sig Start Date End Date Taking? Authorizing Provider  ondansetron (ZOFRAN) 4 MG tablet Take 1 tablet (4 mg total) by mouth every 8 (eight) hours as needed for up to 3 days for nausea or vomiting. 08/28/22 08/31/22 Yes Shery Wauneka L, PA-C  acetaminophen (TYLENOL) 650 MG CR tablet Take 650 mg by mouth every 8 (eight) hours as needed for pain.    [provider]  calcium carbonate (OS-CAL) 600 MG tablet Take 1 tablet (600 mg total) by mouth 2 (two) times daily. 10/21/21   Regalado, Belkys A, MD  Ferrous Sulfate (IRON) 325 (65  Fe) MG TABS Take 1 tablet (325 mg total) by mouth daily at 2 PM. 10/26/21   Rachael Fee, MD  FLUoxetine (PROZAC) 20 MG capsule TAKE 3 CAPSULES(60 MG) BY MOUTH DAILY 08/02/22   Corwin Levins, MD  fulvestrant (FASLODEX) 250 MG/5ML injection Inject 500 mg into the muscle every 30 (thirty) days. One injection each buttock over 1-2 minutes. Warm prior to use.    [provider]  IBRANCE 100 MG tablet TAKE 1 TABLET DAILY ON DAYS 1 THROUGH 21 OF CYCLE, 7 DAYS OFF. REPEAT 05/03/22   Josph Macho, MD  oxyCODONE (OXY IR/ROXICODONE) 5 MG immediate release tablet Take 1 tablet (5 mg total) by mouth every 8 (eight) hours as needed for severe pain. 11/06/20   Josph Macho, MD  pantoprazole (PROTONIX) 40 MG tablet Take 1 tablet (40 mg total) by mouth 2 (two) times daily. 10/21/21   Regalado, Belkys A, MD  vitamin B-12 (CYANOCOBALAMIN) 1000 MCG tablet Take 1 tablet (1,000 mcg total) by mouth daily. 10/21/21   Regalado, Belkys A, MD  Wheat Dextrin (BENEFIBER DRINK MIX PO) Take by mouth daily at 6 (six) AM.    [provider]      Allergies    Amoxicillin-pot clavulanate, Iron, Penicillins, and Codeine    Review of Systems   Review of Systems  All other systems reviewed and are negative.   Physical Exam Updated Vital Signs BP (!) 127/91  Pulse 94   Temp 97.7 F (36.5 C) (Oral)   Resp (!) 22   Ht 5\' 9"  (1.753 m)   Wt 90.7 kg   LMP 06/09/2017 (Exact Date)   SpO2 96%   BMI 29.53 kg/m  Physical Exam Vitals and nursing note reviewed.  Constitutional:      General: She is not in acute distress.    Appearance: Normal appearance. She is not ill-appearing, toxic-appearing or diaphoretic.  HENT:     Head: Normocephalic and atraumatic.     Mouth/Throat:     Mouth: Mucous membranes are moist.  Eyes:     Extraocular Movements: Extraocular movements intact.     Comments: Mildly pale conjunctiva bilaterally  Cardiovascular:     Rate and Rhythm: Normal rate and regular rhythm.      Heart sounds: No murmur heard. Pulmonary:     Effort: Pulmonary effort is normal. No respiratory distress.     Breath sounds: Normal breath sounds. No stridor. No wheezing, rhonchi or rales.  Abdominal:     General: Abdomen is flat. There is no distension.     Palpations: Abdomen is soft. There is no mass.     Tenderness: There is no abdominal tenderness. There is no right CVA tenderness, left CVA tenderness, guarding or rebound.  Genitourinary:    Comments: Rectal exam performed with Dahlia Client, primary RN, chaperone, minimal trace amounts of light brown stool in the rectal vault, no hemorrhoids, no gross bleeding, no anal fissure, no other abnormalities noted Musculoskeletal:        General: Normal range of motion.     Cervical back: Neck supple.     Right lower leg: No edema.     Left lower leg: No edema.  Skin:    General: Skin is warm and dry.     Capillary Refill: Capillary refill takes less than 2 seconds.     Coloration: Skin is not jaundiced or pale.     Findings: No rash.  Neurological:     General: No focal deficit present.     Mental Status: She is alert. Mental status is at baseline.     Cranial Nerves: No cranial nerve deficit.     Motor: No weakness.  Psychiatric:        Mood and Affect: Mood normal.        Behavior: Behavior normal.     ED Results / Procedures / Treatments   Labs (all labs ordered are listed, but only abnormal results are displayed) Labs Reviewed  CBC WITH DIFFERENTIAL/PLATELET - Abnormal; Notable for the following components:      Result Value   RBC 3.58 (*)    HCT 35.9 (*)    MCV 100.3 (*)    MCH 34.6 (*)    Neutro Abs 8.1 (*)    Lymphs Abs 0.5 (*)    Abs Immature Granulocytes 0.17 (*)    All other components within normal limits  COMPREHENSIVE METABOLIC PANEL - Abnormal; Notable for the following components:   Potassium 3.4 (*)    All other components within normal limits  HEMOGLOBIN AND HEMATOCRIT, BLOOD - Abnormal; Notable for the  following components:   Hemoglobin 11.9 (*)    HCT 35.3 (*)    All other components within normal limits  PROTIME-INR  LIPASE, BLOOD  I-STAT CHEM 8, ED  POC OCCULT BLOOD, ED  TYPE AND SCREEN    EKG None  Radiology DG Chest 2 View  Result Date: 08/28/2022 CLINICAL DATA:  upper  GI bleed EXAM: CHEST - 2 VIEW COMPARISON:  03/10/2022 FINDINGS: Lungs clear.  Left retrocardiac hiatal hernia with fluid level. Heart size and mediastinal contours are within normal limits. No effusion. Stable changes of mid and lower thoracic vertebral body cement augmentation. Surgical clips right axilla. IMPRESSION: 1. No acute cardiopulmonary disease. 2. Hiatal hernia. Electronically Signed   By: Corlis Leak M.D.   On: 08/28/2022 09:30    Procedures Orthostatic Lying BP- Lying: 113/76 Pulse- Lying: 86 Orthostatic Sitting BP- Sitting: 117/82 Pulse- Sitting: 102 Orthostatic Standing at 0 minutes BP- Standing at 0 minutes: 117/90 Pulse- Standing at 0 minutes: 112 Orthostatic Standing at 3 minutes BP- Standing at 3 minutes: 123/95 Abnormal  Pulse- Standing at 3 minutes: 119   Medications Ordered in ED Medications  pantoprazole (PROTONIX) injection 40 mg (40 mg Intravenous Given 08/28/22 0756)    ED Course/ Medical Decision Making/ A&P                             Medical Decision Making Amount and/or Complexity of Data Reviewed Labs: ordered. Decision-making details documented in ED Course. Radiology: ordered. Decision-making details documented in ED Course. ECG/medicine tests: ordered. Decision-making details documented in ED Course.  Risk Prescription drug management.   Medical Decision Making:   Khamani Golubski is a 57 y.o. female who presented to the ED today with GI bleed detailed above.    Patient's presentation is complicated by their history of metastatic breast cancer, history of GI  bleed. Patient placed on continuous vitals and telemetry monitoring while in ED which was reviewed  periodically.  Complete initial physical exam performed, notably the patient was in NAD. HD stable. Abdomen soft, non-tender, non-distended. Neurologically intact. Mildly pale conjunctiva bilaterally.     Reviewed and confirmed nursing documentation for past medical history, family history, social history.    Initial Assessment:   With the patient's presentation, differential diagnosis includes but is not limited to upper vs lower GI bleed, anemia, acute kidney injury, electrolyte disturbance, acute abdomen.   This is most consistent with an acute complicated illness  Initial Plan:  Screening labs including CBC and Metabolic panel to evaluate for infectious or metabolic etiology of disease.  Type and screen in case we need to transfuse CXR to evaluate for structural/infectious intrathoracic pathology PT-INR to assess bleeding risk POC occult blood.  EKG to evaluate for cardiac pathology Symptomatic management Lipase to evaluate for pancreatitis Repeat hemoglobin to assess stability Objective evaluation as below reviewed   0845 - Patient rechecked.  Updated on findings at this time.  No more nausea.  No more bowel movements.  States that she is still having a headache but otherwise feels okay.  Declines pain medication.  Will recheck hemoglobin and plan to consult GI with anticipated admission.  Initial Study Results:   Laboratory  All laboratory results reviewed without evidence of clinically relevant pathology.   Exceptions include: K3.4, RBC 3.58, repeat hemoglobin 11.9  EKG EKG was reviewed independently. Rate, rhythm, axis, intervals all examined and without medically relevant abnormality. ST segments without concerns for elevations.    Radiology:  All images reviewed independently. Agree with radiology report at this time.   DG Chest 2 View  Result Date: 08/28/2022 CLINICAL DATA:  upper GI bleed EXAM: CHEST - 2 VIEW COMPARISON:  03/10/2022 FINDINGS: Lungs clear.  Left  retrocardiac hiatal hernia with fluid level. Heart size and mediastinal contours are within normal limits. No effusion. Stable  changes of mid and lower thoracic vertebral body cement augmentation. Surgical clips right axilla. IMPRESSION: 1. No acute cardiopulmonary disease. 2. Hiatal hernia. Electronically Signed   By: Corlis Leak M.D.   On: 08/28/2022 09:30      Consults: Case discussed with Dr. Elnoria Howard with GI by attending physician and pt stable for discharge with outpatient follow up. Pt can call tomorrow to set up appointment.   Final Assessment and Plan:   57 year old female with past medical history of metastatic breast cancer presents to the ED for GI bleed.  History of GI bleed secondary to hiatal hernia/Cameron lesions.  Last endoscopy was December 2023.  Patient not on any anticoagulants.  On initial exam, patient reports that she is currently asymptomatic apart from a mild headache and has not had hematemesis or melena since 0400.  IV pantoprazole administered.  Patient declined pain management in the ED.  Abdomen soft, nontender, nondistended.  Patient not reporting any more bleeding.  No signs of active, rapid bleed.  No indication for abdominal imaging at this time.  Workup obtained as above for further assessment.  On multiple reassessments, patient remained without nausea, vomiting, or further episodes of diarrhea or rectal bleeding.  Hemoglobin stable including repeat.  Patient maintaining blood pressure.  Neurologically intact.  Kidney function normal.  Minimally depleted potassium but otherwise no significant electrolyte disturbances.  On rectal exam, minimal amounts of stool in the rectal vault send fecal occult could be false negative due to lack of adequate stool sample but no gross bleeding or other abnormalities identified.  Chest x-ray with hiatal hernia but no other abnormalities.  Overall, patient has remained stable, asymptomatic. Case discussed with GI and with pt's reassuring labs,  vitals, and exam pt stable for outpatient follow up.  Discussed this with patient who is comfortable with plan.  Strict ED return precautions given.  All questions answered and patient stable at time of discharge.  She does not have antiemetics at home so we will prescribe a small amount of Zofran.  She reports that she has her pantoprazole and is instructed to continue taking this.   Clinical Impression:  1. Gastrointestinal hemorrhage with hematemesis   2. Gastrointestinal hemorrhage with melena   3. Hiatal hernia      Discharge           Final Clinical Impression(s) / ED Diagnoses Final diagnoses:  Gastrointestinal hemorrhage with hematemesis  Gastrointestinal hemorrhage with melena  Hiatal hernia    Rx / DC Orders ED Discharge Orders          Ordered    ondansetron (ZOFRAN) 4 MG tablet  Every 8 hours PRN        08/28/22 1110              Tonette Lederer, PA-C 08/28/22 1121    Benjiman Core, MD 08/29/22 [phone]

## 2022-08-29 ENCOUNTER — Inpatient Hospital Stay: Payer: Medicare HMO

## 2022-08-29 ENCOUNTER — Inpatient Hospital Stay: Payer: Medicare HMO | Admitting: Medical Oncology

## 2022-09-01 ENCOUNTER — Encounter: Payer: Self-pay | Admitting: Medical Oncology

## 2022-09-01 ENCOUNTER — Telehealth: Payer: Self-pay

## 2022-09-01 ENCOUNTER — Inpatient Hospital Stay (HOSPITAL_BASED_OUTPATIENT_CLINIC_OR_DEPARTMENT_OTHER): Payer: Medicare HMO | Admitting: Medical Oncology

## 2022-09-01 ENCOUNTER — Inpatient Hospital Stay: Payer: Medicare HMO | Attending: Hematology & Oncology

## 2022-09-01 ENCOUNTER — Other Ambulatory Visit: Payer: Self-pay

## 2022-09-01 ENCOUNTER — Inpatient Hospital Stay: Payer: Medicare HMO

## 2022-09-01 VITALS — BP 115/88 | HR 50 | Temp 98.1°F | Resp 18 | Ht 68.0 in | Wt 195.8 lb

## 2022-09-01 DIAGNOSIS — R457 State of emotional shock and stress, unspecified: Secondary | ICD-10-CM | POA: Diagnosis not present

## 2022-09-01 DIAGNOSIS — Z79818 Long term (current) use of other agents affecting estrogen receptors and estrogen levels: Secondary | ICD-10-CM | POA: Diagnosis not present

## 2022-09-01 DIAGNOSIS — Z5111 Encounter for antineoplastic chemotherapy: Secondary | ICD-10-CM | POA: Insufficient documentation

## 2022-09-01 DIAGNOSIS — Z79899 Other long term (current) drug therapy: Secondary | ICD-10-CM | POA: Diagnosis not present

## 2022-09-01 DIAGNOSIS — C7951 Secondary malignant neoplasm of bone: Secondary | ICD-10-CM | POA: Diagnosis not present

## 2022-09-01 DIAGNOSIS — Z170 Estrogen receptor positive status [ER+]: Secondary | ICD-10-CM | POA: Diagnosis not present

## 2022-09-01 DIAGNOSIS — D509 Iron deficiency anemia, unspecified: Secondary | ICD-10-CM | POA: Insufficient documentation

## 2022-09-01 DIAGNOSIS — C50919 Malignant neoplasm of unspecified site of unspecified female breast: Secondary | ICD-10-CM

## 2022-09-01 DIAGNOSIS — C50011 Malignant neoplasm of nipple and areola, right female breast: Secondary | ICD-10-CM | POA: Insufficient documentation

## 2022-09-01 LAB — CMP (CANCER CENTER ONLY)
ALT: 16 U/L (ref 0–44)
AST: 13 U/L — ABNORMAL LOW (ref 15–41)
Albumin: 4.1 g/dL (ref 3.5–5.0)
Alkaline Phosphatase: 55 U/L (ref 38–126)
Anion gap: 3 — ABNORMAL LOW (ref 5–15)
BUN: 14 mg/dL (ref 6–20)
CO2: 27 mmol/L (ref 22–32)
Calcium: 9.5 mg/dL (ref 8.9–10.3)
Chloride: 107 mmol/L (ref 98–111)
Creatinine: 1.11 mg/dL — ABNORMAL HIGH (ref 0.44–1.00)
GFR, Estimated: 58 mL/min — ABNORMAL LOW (ref >60)
Glucose, Bld: 94 mg/dL (ref 70–99)
Potassium: 3.4 mmol/L — ABNORMAL LOW (ref 3.5–5.1)
Sodium: 137 mmol/L (ref 135–145)
Total Bilirubin: 0.6 mg/dL (ref 0.3–1.2)
Total Protein: 6.7 g/dL (ref 6.5–8.1)

## 2022-09-01 LAB — CBC WITH DIFFERENTIAL (CANCER CENTER ONLY)
Abs Immature Granulocytes: 0.01 10*3/uL (ref 0.00–0.07)
Basophils Absolute: 0 10*3/uL (ref 0.0–0.1)
Basophils Relative: 1 %
Eosinophils Absolute: 0.2 10*3/uL (ref 0.0–0.5)
Eosinophils Relative: 4 %
HCT: 35.1 % — ABNORMAL LOW (ref 36.0–46.0)
Hemoglobin: 11.9 g/dL — ABNORMAL LOW (ref 12.0–15.0)
Immature Granulocytes: 0 %
Lymphocytes Relative: 23 %
Lymphs Abs: 1.2 10*3/uL (ref 0.7–4.0)
MCH: 33.5 pg (ref 26.0–34.0)
MCHC: 33.9 g/dL (ref 30.0–36.0)
MCV: 98.9 fL (ref 80.0–100.0)
Monocytes Absolute: 0.4 10*3/uL (ref 0.1–1.0)
Monocytes Relative: 9 %
Neutro Abs: 3.3 10*3/uL (ref 1.7–7.7)
Neutrophils Relative %: 63 %
Platelet Count: 240 10*3/uL (ref 150–400)
RBC: 3.55 MIL/uL — ABNORMAL LOW (ref 3.87–5.11)
RDW: 13.8 % (ref 11.5–15.5)
WBC Count: 5.2 10*3/uL (ref 4.0–10.5)
nRBC: 0 % (ref 0.0–0.2)

## 2022-09-01 MED ORDER — FULVESTRANT 250 MG/5ML IM SOSY
500.0000 mg | PREFILLED_SYRINGE | INTRAMUSCULAR | Status: DC
  Administered 2022-09-01: 500 mg via INTRAMUSCULAR
  Filled 2022-09-01: qty 10

## 2022-09-01 NOTE — Patient Instructions (Signed)

## 2022-09-01 NOTE — Telephone Encounter (Signed)
CSW attempted to contact patient per the request of Clent Jacks.  Left vm.

## 2022-09-01 NOTE — Progress Notes (Signed)
Hematology and Oncology Follow Up Visit  Caitlin Greene [id number] Mar 30, 1966 57 y.o. 09/01/2022   Principle Diagnosis:  Metastatic breast cancer-ER positive/HER-2 negative --bone metastasis only Iron deficiency anemia  Current Therapy:   Faslodex 500 mg IM monthly --start on 04/2020 Ibrance 100 mg p.o. daily (21d on/7d off) - start on 04/2020 Xgeva 120 mg subcu every 3 months -next dose 10/2022 IV iron-Feraheme given on 09/14/2021     Interim History:  Ms. Caitlin Greene is in for follow-up.   Unfortunately she continues to have a lot of life stressors. Her sister has spontaneous CJD and father has recently fallen and is moving into a long term care facility. This has been hard on the whole family and especially Caitlin Greene as she is very close with her sister.   Since her last visit she did have a suspected upper GI bleed and was seen in the ER on 08/28/2022. Bleeding stopped in the hospital and hemoglobin remained stable so she was discharged. She did not undergo an endoscopy or colonoscopy at that time. She did not require blood products. She has not had any bleeding episodes since. Possibly spurred on by food poisoning and vomiting.   She denies headaches, double vision, visual changes.   No planned dental events.   Her last iron studies that we did back in December did show an iron saturation of 10%.  Because her hemoglobin was doing well, we did not give her any IV iron.  Her last CA 27.29 level from 07/28/2022 had dropped from 52.7 to 47.2 which is reassuring. She also had recent CT imaging which showed no new or progressive changes.   Currently, I would have said that her performance status is probably ECOG 1. . Wt Readings from Last 3 Encounters:  09/01/22 195 lb 12.8 oz (88.8 kg)  08/28/22 200 lb (90.7 kg)  08/02/22 202 lb (91.6 kg)    Medications:  Current Outpatient Medications:    acetaminophen (TYLENOL) 650 MG CR tablet, Take 650 mg by mouth every 8 (eight) hours as needed  for pain., Disp: , Rfl:    calcium carbonate (OS-CAL) 600 MG tablet, Take 1 tablet (600 mg total) by mouth 2 (two) times daily., Disp: 30 tablet, Rfl: 0   Ferrous Sulfate (IRON) 325 (65 Fe) MG TABS, Take 1 tablet (325 mg total) by mouth daily at 2 PM., Disp: 30 tablet, Rfl: 0   FLUoxetine (PROZAC) 20 MG capsule, TAKE 3 CAPSULES(60 MG) BY MOUTH DAILY, Disp: 270 capsule, Rfl: 3   fulvestrant (FASLODEX) 250 MG/5ML injection, Inject 500 mg into the muscle every 30 (thirty) days. One injection each buttock over 1-2 minutes. Warm prior to use., Disp: , Rfl:    IBRANCE 100 MG tablet, TAKE 1 TABLET DAILY ON DAYS 1 THROUGH 21 OF CYCLE, 7 DAYS OFF. REPEAT, Disp: 21 tablet, Rfl: 0   oxyCODONE (OXY IR/ROXICODONE) 5 MG immediate release tablet, Take 1 tablet (5 mg total) by mouth every 8 (eight) hours as needed for severe pain., Disp: 50 tablet, Rfl: 0   pantoprazole (PROTONIX) 40 MG tablet, Take 1 tablet (40 mg total) by mouth 2 (two) times daily., Disp: 60 tablet, Rfl: 2   vitamin B-12 (CYANOCOBALAMIN) 1000 MCG tablet, Take 1 tablet (1,000 mcg total) by mouth daily., Disp: 30 tablet, Rfl: 0   Wheat Dextrin (BENEFIBER DRINK MIX PO), Take by mouth daily at 6 (six) AM., Disp: , Rfl:   Allergies:  Allergies  Allergen Reactions   Amoxicillin-Pot Clavulanate Hives and Itching  Iron Anaphylaxis and Other (See Comments)    Patient develops hypotension, resp distress after infusion of feraheme.    Penicillins Hives, Itching and Other (See Comments)   Codeine Nausea And Vomiting    Needs pre-meds     Past Medical History, Surgical history, Social history, and Family History were reviewed and updated.  Review of Systems: Review of Systems  Constitutional:  Negative for appetite change.  HENT:  Negative.    Eyes: Negative.   Respiratory: Negative.    Cardiovascular: Negative.   Gastrointestinal:  Negative for abdominal pain and nausea.  Endocrine: Negative.   Genitourinary: Negative.    Musculoskeletal:   Positive for arthralgias and back pain.  Skin: Negative.   Neurological:  Negative for dizziness.  Hematological: Negative.   Psychiatric/Behavioral: Negative.      Physical Exam:  height is 5\' 8"  (1.727 m) and weight is 195 lb 12.8 oz (88.8 kg). Her oral temperature is 98.1 F (36.7 C). Her blood pressure is 115/88 and her pulse is 50 (abnormal). Her respiration is 18 and oxygen saturation is 100%.   Wt Readings from Last 3 Encounters:  09/01/22 195 lb 12.8 oz (88.8 kg)  08/28/22 200 lb (90.7 kg)  08/02/22 202 lb (91.6 kg)    Physical Exam Vitals reviewed.  HENT:     Head: Normocephalic and atraumatic.  Eyes:     Pupils: Pupils are equal, round, and reactive to light.  Cardiovascular:     Rate and Rhythm: Normal rate and regular rhythm.     Heart sounds: Normal heart sounds.  Pulmonary:     Effort: Pulmonary effort is normal.     Breath sounds: Normal breath sounds.  Abdominal:     General: Bowel sounds are normal.     Palpations: Abdomen is soft.  Musculoskeletal:        General: No tenderness or deformity. Normal range of motion.     Cervical back: Normal range of motion.  Lymphadenopathy:     Cervical: No cervical adenopathy.  Skin:    General: Skin is warm and dry.     Findings: No erythema or rash.  Neurological:     Mental Status: She is alert and oriented to person, place, and time.  Psychiatric:        Behavior: Behavior normal.        Thought Content: Thought content normal.        Judgment: Judgment normal.      Lab Results  Component Value Date   WBC 5.2 09/01/2022   HGB 11.9 (L) 09/01/2022   HCT 35.1 (L) 09/01/2022   MCV 98.9 09/01/2022   PLT 240 09/01/2022     Chemistry      Component Value Date/Time   NA 141 08/28/2022 0654   K 3.5 08/28/2022 0654   CL 106 08/28/2022 0654   CO2 22 08/28/2022 0617   BUN 18 08/28/2022 0654   CREATININE 0.90 08/28/2022 0654   CREATININE 1.03 (H) 07/28/2022 1229   CREATININE 0.88 10/22/2019 1246       Component Value Date/Time   CALCIUM 9.0 08/28/2022 0617   ALKPHOS 51 08/28/2022 0617   AST 24 08/28/2022 0617   AST 15 07/28/2022 1229   ALT 32 08/28/2022 0617   ALT 14 07/28/2022 1229   BILITOT 0.6 08/28/2022 0617   BILITOT 0.6 07/28/2022 1229     Encounter Diagnosis  Name Primary?   Malignant neoplasm of female breast, unspecified estrogen receptor status, unspecified laterality, unspecified site of  breast Yes    Impression and Plan: Ms. Marando is a very charming 57 year old postmenopausal female with metastatic breast cancer.  We actually had seen her many years ago with a carcinoma in situ.  Neutropenia: Resolved as of today. Will continue to monitor.   Anemia: Chronic and stable. Her Hgb has not dropped since ER visit which is great news. Her last iron studies showed a ferritin of 11 and iron saturation of 30%. Has had a GI bleed since then but had a high risk event to [facility] in the past. Will hold off on additional iron infusions at this time. I have suggested she schedule her GI follow up which she is agreeable with.   Breast Cancer: She appears to be doing well. Weight is down a bit but likely from stress. We will watch this and her labs closely. Faslodex today   Stress: We had a long discussion brainstorming how she can best handle her stressors. Counseling recommended which she is excited about. Referral placed.   Disposition: Faslodex today Referral to social work to help with her stressors  RTC 1 month MD, labs(CBC, CMP, CA 27.29, LDH, Iron, ferritin) Faslodex

## 2022-09-28 ENCOUNTER — Other Ambulatory Visit: Payer: Self-pay | Admitting: *Deleted

## 2022-09-28 DIAGNOSIS — D500 Iron deficiency anemia secondary to blood loss (chronic): Secondary | ICD-10-CM

## 2022-09-28 DIAGNOSIS — C50919 Malignant neoplasm of unspecified site of unspecified female breast: Secondary | ICD-10-CM

## 2022-09-28 DIAGNOSIS — C7951 Secondary malignant neoplasm of bone: Secondary | ICD-10-CM

## 2022-09-29 ENCOUNTER — Inpatient Hospital Stay (HOSPITAL_BASED_OUTPATIENT_CLINIC_OR_DEPARTMENT_OTHER): Payer: Medicare HMO | Admitting: Hematology & Oncology

## 2022-09-29 ENCOUNTER — Other Ambulatory Visit: Payer: Self-pay

## 2022-09-29 ENCOUNTER — Telehealth: Payer: Self-pay

## 2022-09-29 ENCOUNTER — Other Ambulatory Visit: Payer: Self-pay | Admitting: Medical Oncology

## 2022-09-29 ENCOUNTER — Encounter: Payer: Self-pay | Admitting: Hematology & Oncology

## 2022-09-29 ENCOUNTER — Inpatient Hospital Stay: Payer: Medicare HMO | Attending: Hematology & Oncology

## 2022-09-29 ENCOUNTER — Inpatient Hospital Stay: Payer: Medicare HMO

## 2022-09-29 VITALS — BP 131/86 | HR 72 | Temp 98.1°F | Resp 18 | Ht 68.0 in | Wt 199.1 lb

## 2022-09-29 DIAGNOSIS — Z79899 Other long term (current) drug therapy: Secondary | ICD-10-CM | POA: Diagnosis not present

## 2022-09-29 DIAGNOSIS — D500 Iron deficiency anemia secondary to blood loss (chronic): Secondary | ICD-10-CM

## 2022-09-29 DIAGNOSIS — C7951 Secondary malignant neoplasm of bone: Secondary | ICD-10-CM | POA: Diagnosis not present

## 2022-09-29 DIAGNOSIS — Z170 Estrogen receptor positive status [ER+]: Secondary | ICD-10-CM

## 2022-09-29 DIAGNOSIS — C50011 Malignant neoplasm of nipple and areola, right female breast: Secondary | ICD-10-CM

## 2022-09-29 DIAGNOSIS — Z86000 Personal history of in-situ neoplasm of breast: Secondary | ICD-10-CM | POA: Insufficient documentation

## 2022-09-29 DIAGNOSIS — C50919 Malignant neoplasm of unspecified site of unspecified female breast: Secondary | ICD-10-CM

## 2022-09-29 DIAGNOSIS — C50012 Malignant neoplasm of nipple and areola, left female breast: Secondary | ICD-10-CM

## 2022-09-29 LAB — CBC WITH DIFFERENTIAL (CANCER CENTER ONLY)
Abs Immature Granulocytes: 0.01 10*3/uL (ref 0.00–0.07)
Basophils Absolute: 0.1 10*3/uL (ref 0.0–0.1)
Basophils Relative: 3 %
Eosinophils Absolute: 0.1 10*3/uL (ref 0.0–0.5)
Eosinophils Relative: 4 %
HCT: 33.8 % — ABNORMAL LOW (ref 36.0–46.0)
Hemoglobin: 11.5 g/dL — ABNORMAL LOW (ref 12.0–15.0)
Immature Granulocytes: 0 %
Lymphocytes Relative: 40 %
Lymphs Abs: 1.1 10*3/uL (ref 0.7–4.0)
MCH: 34.4 pg — ABNORMAL HIGH (ref 26.0–34.0)
MCHC: 34 g/dL (ref 30.0–36.0)
MCV: 101.2 fL — ABNORMAL HIGH (ref 80.0–100.0)
Monocytes Absolute: 0.3 10*3/uL (ref 0.1–1.0)
Monocytes Relative: 11 %
Neutro Abs: 1.2 10*3/uL — ABNORMAL LOW (ref 1.7–7.7)
Neutrophils Relative %: 42 %
Platelet Count: 192 10*3/uL (ref 150–400)
RBC: 3.34 MIL/uL — ABNORMAL LOW (ref 3.87–5.11)
RDW: 15 % (ref 11.5–15.5)
WBC Count: 2.8 10*3/uL — ABNORMAL LOW (ref 4.0–10.5)
nRBC: 0 % (ref 0.0–0.2)

## 2022-09-29 LAB — CMP (CANCER CENTER ONLY)
ALT: 11 U/L (ref 0–44)
AST: 10 U/L — ABNORMAL LOW (ref 15–41)
Albumin: 4.3 g/dL (ref 3.5–5.0)
Alkaline Phosphatase: 59 U/L (ref 38–126)
Anion gap: 6 (ref 5–15)
BUN: 20 mg/dL (ref 6–20)
CO2: 27 mmol/L (ref 22–32)
Calcium: 9.6 mg/dL (ref 8.9–10.3)
Chloride: 107 mmol/L (ref 98–111)
Creatinine: 1.02 mg/dL — ABNORMAL HIGH (ref 0.44–1.00)
GFR, Estimated: >60 mL/min (ref >60)
Glucose, Bld: 103 mg/dL — ABNORMAL HIGH (ref 70–99)
Potassium: 3.8 mmol/L (ref 3.5–5.1)
Sodium: 140 mmol/L (ref 135–145)
Total Bilirubin: 0.5 mg/dL (ref 0.3–1.2)
Total Protein: 6.5 g/dL (ref 6.5–8.1)

## 2022-09-29 LAB — IRON AND IRON BINDING CAPACITY (CC-WL,HP ONLY)
Iron: 82 ug/dL (ref 28–170)
Saturation Ratios: 20 % (ref 10.4–31.8)
TIBC: 414 ug/dL (ref 250–450)
UIBC: 332 ug/dL (ref 148–442)

## 2022-09-29 LAB — FERRITIN: Ferritin: 8 ng/mL — ABNORMAL LOW (ref 11–307)

## 2022-09-29 MED ORDER — FULVESTRANT 250 MG/5ML IM SOSY
500.0000 mg | PREFILLED_SYRINGE | INTRAMUSCULAR | Status: DC
  Administered 2022-09-29: 500 mg via INTRAMUSCULAR

## 2022-09-29 NOTE — Progress Notes (Signed)
Hematology and Oncology Follow Up Visit  Caitlin Greene [id number] 12-03-1965 57 y.o. 09/29/2022   Principle Diagnosis:  Metastatic breast cancer-ER positive/HER-2 negative --bone metastasis only Iron deficiency anemia  Current Therapy:   Faslodex 500 mg IM monthly --start on 04/2020 Ibrance 100 mg p.o. daily (21d on/7d off) - start on 04/2020 Xgeva 120 mg subcu every 3 months -next dose 10/2022 IV iron-Feraheme given on 09/01/2022      Interim History:  Caitlin Greene is in for follow-up.  She is managing the best that she can.  Her sister and father both in long term care facilities.  They are doing okay.  I know that she has a lot on her plate right now.  Thankfully, she is done well with the Faslodex and Ibrance.  We have been on this now for 2 and half years.  She has had no evidence of disease progression.  Her last CA 27-29 back in March was down to 43.  She has had no change in bowel or bladder habits.  She has had no nausea or vomiting.  She has had no cough or shortness of breath.  Thankfully, she has not had any issues with GI blood loss.  When we last saw her, her ferritin was 11 with an iron saturation of 30%.  We did go ahead and give her dose of IV iron.  She has had no fever.  She has had no headache.  Overall, I would have said that her performance status is probably ECOG 1.   . Wt Readings from Last 3 Encounters:  09/29/22 199 lb 1.9 oz (90.3 kg)  09/01/22 195 lb 12.8 oz (88.8 kg)  08/28/22 200 lb (90.7 kg)    Medications:  Current Outpatient Medications:    acetaminophen (TYLENOL) 650 MG CR tablet, Take 650 mg by mouth every 8 (eight) hours as needed for pain., Disp: , Rfl:    calcium carbonate (OS-CAL) 600 MG tablet, Take 1 tablet (600 mg total) by mouth 2 (two) times daily., Disp: 30 tablet, Rfl: 0   Ferrous Sulfate (IRON) 325 (65 Fe) MG TABS, Take 1 tablet (325 mg total) by mouth daily at 2 PM., Disp: 30 tablet, Rfl: 0   FLUoxetine (PROZAC) 20 MG capsule,  TAKE 3 CAPSULES(60 MG) BY MOUTH DAILY, Disp: 270 capsule, Rfl: 3   fulvestrant (FASLODEX) 250 MG/5ML injection, Inject 500 mg into the muscle every 30 (thirty) days. One injection each buttock over 1-2 minutes. Warm prior to use., Disp: , Rfl:    IBRANCE 100 MG tablet, TAKE 1 TABLET DAILY ON DAYS 1 THROUGH 21 OF CYCLE, 7 DAYS OFF. REPEAT, Disp: 21 tablet, Rfl: 0   oxyCODONE (OXY IR/ROXICODONE) 5 MG immediate release tablet, Take 1 tablet (5 mg total) by mouth every 8 (eight) hours as needed for severe pain., Disp: 50 tablet, Rfl: 0   pantoprazole (PROTONIX) 40 MG tablet, Take 1 tablet (40 mg total) by mouth 2 (two) times daily., Disp: 60 tablet, Rfl: 2   vitamin B-12 (CYANOCOBALAMIN) 1000 MCG tablet, Take 1 tablet (1,000 mcg total) by mouth daily., Disp: 30 tablet, Rfl: 0   Wheat Dextrin (BENEFIBER DRINK MIX PO), Take by mouth daily at 6 (six) AM., Disp: , Rfl:   Allergies:  Allergies  Allergen Reactions   Amoxicillin-Pot Clavulanate Hives and Itching   Iron Anaphylaxis and Other (See Comments)    Patient develops hypotension, resp distress after infusion of feraheme.    Penicillins Hives, Itching and Other (See Comments)  Codeine Nausea And Vomiting    Needs pre-meds     Past Medical History, Surgical history, Social history, and Family History were reviewed and updated.  Review of Systems: Review of Systems  Constitutional:  Negative for appetite change.  HENT:  Negative.    Eyes: Negative.   Respiratory: Negative.    Cardiovascular: Negative.   Gastrointestinal:  Negative for abdominal pain and nausea.  Endocrine: Negative.   Genitourinary: Negative.    Musculoskeletal:  Positive for arthralgias and back pain.  Skin: Negative.   Neurological:  Negative for dizziness.  Hematological: Negative.   Psychiatric/Behavioral: Negative.      Physical Exam:  height is 5\' 8"  (1.727 m) and weight is 199 lb 1.9 oz (90.3 kg). Her oral temperature is 98.1 F (36.7 C). Her blood pressure  is 131/86 and her pulse is 72. Her respiration is 18 and oxygen saturation is 98%.   Wt Readings from Last 3 Encounters:  09/29/22 199 lb 1.9 oz (90.3 kg)  09/01/22 195 lb 12.8 oz (88.8 kg)  08/28/22 200 lb (90.7 kg)    Physical Exam Vitals reviewed.  HENT:     Head: Normocephalic and atraumatic.  Eyes:     Pupils: Pupils are equal, round, and reactive to light.  Cardiovascular:     Rate and Rhythm: Normal rate and regular rhythm.     Heart sounds: Normal heart sounds.  Pulmonary:     Effort: Pulmonary effort is normal.     Breath sounds: Normal breath sounds.  Abdominal:     General: Bowel sounds are normal.     Palpations: Abdomen is soft.  Musculoskeletal:        General: No tenderness or deformity. Normal range of motion.     Cervical back: Normal range of motion.  Lymphadenopathy:     Cervical: No cervical adenopathy.  Skin:    General: Skin is warm and dry.     Findings: No erythema or rash.  Neurological:     Mental Status: She is alert and oriented to person, place, and time.  Psychiatric:        Behavior: Behavior normal.        Thought Content: Thought content normal.        Judgment: Judgment normal.      Lab Results  Component Value Date   WBC 2.8 (L) 09/29/2022   HGB 11.5 (L) 09/29/2022   HCT 33.8 (L) 09/29/2022   MCV 101.2 (H) 09/29/2022   PLT 192 09/29/2022     Chemistry      Component Value Date/Time   NA 140 09/29/2022 0833   K 3.8 09/29/2022 0833   CL 107 09/29/2022 0833   CO2 27 09/29/2022 0833   BUN 20 09/29/2022 0833   CREATININE 1.02 (H) 09/29/2022 0833   CREATININE 0.88 10/22/2019 1246      Component Value Date/Time   CALCIUM 9.6 09/29/2022 0833   ALKPHOS 59 09/29/2022 0833   AST 10 (L) 09/29/2022 0833   ALT 11 09/29/2022 0833   BILITOT 0.5 09/29/2022 0833     No diagnosis found.   Impression and Plan: Caitlin Greene is a very charming 57 year old postmenopausal female with metastatic breast cancer.  We actually had seen her  many years ago with a carcinoma in situ.  I am glad that she is doing okay.  I know that she wants to be able to take care of her sister and father.  Today, she will get Faslodex.  We do have  to set her up with scans.  I will set her up with a bone scan and a CT scan to be done in about 3 weeks.  We will see what her CA 27.29 shows.  As always, we will stay strong and prayer for her.  Again she is doing a great job trying to help her sister and father.

## 2022-09-29 NOTE — Telephone Encounter (Signed)
Advised via MyChart.

## 2022-09-29 NOTE — Patient Instructions (Signed)

## 2022-09-29 NOTE — Telephone Encounter (Signed)
-----   Message from Josph Macho, MD sent at 09/29/2022  2:21 PM EDT ----- Please call let him know that the iron is quite low.  We need to give her dose of IV iron.  Please set this up.  Thanks.  Cindee Lame

## 2022-09-30 ENCOUNTER — Ambulatory Visit: Payer: Medicare HMO | Admitting: Internal Medicine

## 2022-09-30 ENCOUNTER — Other Ambulatory Visit: Payer: Self-pay | Admitting: Internal Medicine

## 2022-09-30 ENCOUNTER — Encounter: Payer: Self-pay | Admitting: Internal Medicine

## 2022-09-30 VITALS — BP 124/76 | HR 73 | Temp 98.1°F | Ht 68.0 in | Wt 199.0 lb

## 2022-09-30 DIAGNOSIS — R42 Dizziness and giddiness: Secondary | ICD-10-CM

## 2022-09-30 DIAGNOSIS — J069 Acute upper respiratory infection, unspecified: Secondary | ICD-10-CM | POA: Diagnosis not present

## 2022-09-30 DIAGNOSIS — E538 Deficiency of other specified B group vitamins: Secondary | ICD-10-CM | POA: Diagnosis not present

## 2022-09-30 DIAGNOSIS — B9689 Other specified bacterial agents as the cause of diseases classified elsewhere: Secondary | ICD-10-CM | POA: Insufficient documentation

## 2022-09-30 DIAGNOSIS — C7951 Secondary malignant neoplasm of bone: Secondary | ICD-10-CM | POA: Diagnosis not present

## 2022-09-30 DIAGNOSIS — H6991 Unspecified Eustachian tube disorder, right ear: Secondary | ICD-10-CM | POA: Insufficient documentation

## 2022-09-30 LAB — CANCER ANTIGEN 27.29: CA 27.29: 31 U/mL (ref 0.0–38.6)

## 2022-09-30 LAB — POC COVID19 BINAXNOW: SARS Coronavirus 2 Ag: NEGATIVE

## 2022-09-30 MED ORDER — AZITHROMYCIN 250 MG PO TABS: ORAL_TABLET | ORAL | 1 refills | Status: AC

## 2022-09-30 MED ORDER — HYDROCODONE BIT-HOMATROP MBR 5-1.5 MG/5ML PO SOLN: 5.0000 mL | Freq: Four times a day (QID) | ORAL | 0 refills | Status: AC | PRN

## 2022-09-30 MED ORDER — MECLIZINE HCL 12.5 MG PO TABS: 12.5000 mg | ORAL_TABLET | Freq: Three times a day (TID) | ORAL | 1 refills | Status: AC | PRN

## 2022-09-30 NOTE — Patient Instructions (Addendum)
Your covid testing was done today - negative  Please take all new medication as prescribed - the antibiotic, cough medicine, and meclizine as needed for vertigo  You can also take Mucinex (or it's generic off brand) for congestion, and tylenol as needed for pain.  Please continue all other medications as before, and refills have been done if requested.  Please have the pharmacy call with any other refills you may need.  Please keep your appointments with your specialists as you may have planned

## 2022-09-30 NOTE — Consult Note (Signed)
Chief Complaint: Patient was seen in consultation today for Port-A-Cath placement  Referring Physician(s): Ennever,Peter R  Supervising Physician: Richarda Overlie  Patient Status: [facility] - Out-pt  History of Present Illness: Caitlin Greene is a 57 y.o. female with past medical history of iron deficiency anemia, anxiety/depression,  GERD, hyperlipidemia, nephrolithiasis and metastatic right breast cancer.  She is scheduled today for Port-A-Cath placement for IV medication use/iron infusions.  Past Medical History:  Diagnosis Date   ALLERGIC RHINITIS    ANEMIA-IRON DEFICIENCY    ANXIETY    BREAST CANCER, HX OF 01/21/2007   at 57yo   DEPRESSION    GERD    HYPERLIPIDEMIA    Metastatic cancer to bone (HCC) dx'd 10/24/2019   recurrent breast ca   Pneumonia 02/2022   RENAL CALCULUS    rt breast ca dx'd 1988    Past Surgical History:  Procedure Laterality Date   BIOPSY  03/21/2020   Procedure: BIOPSY;  Surgeon: Tressia Danas, MD;  Location: [facility];  Service: Gastroenterology;;   BIOPSY  09/13/2021   Procedure: BIOPSY;  Surgeon: Rachael Fee, MD;  Location: [facility];  Service: Gastroenterology;;   BIOPSY  10/19/2021   Procedure: BIOPSY;  Surgeon: Tressia Danas, MD;  Location: [facility];  Service: Gastroenterology;;   BREAST ENHANCEMENT SURGERY     COLONOSCOPY WITH PROPOFOL N/A 10/19/2021   Procedure: COLONOSCOPY WITH PROPOFOL;  Surgeon: Tressia Danas, MD;  Location: [facility];  Service: Gastroenterology;  Laterality: N/A;   ENTEROSCOPY N/A 10/19/2021   Procedure: ENTEROSCOPY;  Surgeon: Tressia Danas, MD;  Location: [facility];  Service: Gastroenterology;  Laterality: N/A;   ESOPHAGOGASTRODUODENOSCOPY N/A 10/16/2021   Procedure: ESOPHAGOGASTRODUODENOSCOPY (EGD);  Surgeon: Jenel Lucks, MD;  Location: [facility];  Service: Gastroenterology;  Laterality: N/A;   ESOPHAGOGASTRODUODENOSCOPY (EGD) WITH PROPOFOL N/A 03/21/2020   Procedure:  ESOPHAGOGASTRODUODENOSCOPY (EGD) WITH PROPOFOL;  Surgeon: Tressia Danas, MD;  Location: [facility];  Service: Gastroenterology;  Laterality: N/A;   ESOPHAGOGASTRODUODENOSCOPY (EGD) WITH PROPOFOL N/A 09/13/2021   Procedure: ESOPHAGOGASTRODUODENOSCOPY (EGD) WITH PROPOFOL;  Surgeon: Rachael Fee, MD;  Location: [facility];  Service: Gastroenterology;  Laterality: N/A;   MASTECTOMY     right   POLYPECTOMY  10/19/2021   Procedure: POLYPECTOMY;  Surgeon: Tressia Danas, MD;  Location: [facility];  Service: Gastroenterology;;   TEMPOROMANDIBULAR JOINT SURGERY     Left   TONSILLECTOMY      Allergies: Amoxicillin-pot clavulanate, Iron, Penicillins, and Codeine  Medications: Prior to Admission medications   Medication Sig Start Date End Date Taking? Authorizing Provider  acetaminophen (TYLENOL) 650 MG CR tablet Take 650 mg by mouth every 8 (eight) hours as needed for pain.    [provider]  azithromycin (ZITHROMAX) 250 MG tablet Take 2 tablets on day 1, then 1 tablet daily on days 2 through 5 09/30/22 10/05/22  Corwin Levins, MD  calcium carbonate (OS-CAL) 600 MG tablet Take 1 tablet (600 mg total) by mouth 2 (two) times daily. 10/21/21   Regalado, Belkys A, MD  Ferrous Sulfate (IRON) 325 (65 Fe) MG TABS Take 1 tablet (325 mg total) by mouth daily at 2 PM. 10/26/21   Rachael Fee, MD  FLUoxetine (PROZAC) 20 MG capsule TAKE 3 CAPSULES(60 MG) BY MOUTH DAILY 08/02/22   Corwin Levins, MD  fulvestrant (FASLODEX) 250 MG/5ML injection Inject 500 mg into the muscle every 30 (thirty) days. One injection each buttock over 1-2 minutes. Warm prior to use.    [provider]  HYDROcodone  bit-homatropine (HYCODAN) 5-1.5 MG/5ML syrup Take 5 mLs by mouth every 6 (six) hours as needed for up to 10 days. 09/30/22 10/10/22  Corwin Levins, MD  IBRANCE 100 MG tablet TAKE 1 TABLET DAILY ON DAYS 1 THROUGH 21 OF CYCLE, 7 DAYS OFF. REPEAT 05/03/22   Josph Macho, MD  meclizine (ANTIVERT)  12.5 MG tablet Take 1 tablet (12.5 mg total) by mouth 3 (three) times daily as needed for dizziness. 09/30/22 09/30/23  Corwin Levins, MD  oxyCODONE (OXY IR/ROXICODONE) 5 MG immediate release tablet Take 1 tablet (5 mg total) by mouth every 8 (eight) hours as needed for severe pain. 11/06/20   Josph Macho, MD  pantoprazole (PROTONIX) 40 MG tablet Take 1 tablet (40 mg total) by mouth 2 (two) times daily. 10/21/21   Regalado, Belkys A, MD  vitamin B-12 (CYANOCOBALAMIN) 1000 MCG tablet Take 1 tablet (1,000 mcg total) by mouth daily. 10/21/21   Regalado, Prentiss Bells, MD  Wheat Dextrin (BENEFIBER DRINK MIX PO) Take by mouth daily at 6 (six) AM.    [provider]     Family History  Problem Relation Age of Onset   Colon polyps Mother    Cancer Mother        Uterine Cancer   Hypertension Mother    Dementia Mother    Colon polyps Father    COPD Father        smoked   Hypertension Other    Diabetes Other    Asthma Sister     Social History   Socioeconomic History   Marital status: Widowed    Spouse name: Not on file   Number of children: 0   Years of education: Not on file   Highest education level: Not on file  Occupational History   Occupation: BANKRUPTCY ANALYST    Employer: [employer]  Tobacco Use   Smoking status: Never   Smokeless tobacco: Never  Vaping Use   Vaping Use: Never used  Substance and Sexual Activity   Alcohol use: No    Alcohol/week: 0.0 standard drinks of alcohol   Drug use: No   Sexual activity: Not on file  Other Topics Concern   Not on file  Social History Narrative   Not on file   Social Determinants of Health   Financial Resource Strain: Not on file  Food Insecurity: Not on file  Transportation Needs: Not on file  Physical Activity: Not on file  Stress: Not on file  Social Connections: Not on file     Review of Systems  Vital Signs: LMP 06/09/2017 (Exact Date)   Code Status:   Physical Exam  Imaging: No results  found.  Labs:  CBC: Recent Labs    07/28/22 1229 08/28/22 0617 08/28/22 0654 08/28/22 0930 09/01/22 1118 09/29/22 0833  WBC 2.5* 9.6  --   --  5.2 2.8*  HGB 12.3 12.4 12.2 11.9* 11.9* 11.5*  HCT 35.8* 35.9* 36.0 35.3* 35.1* 33.8*  PLT 178 233  --   --  240 192    COAGS: Recent Labs    10/15/21 2242 10/16/21 0613 08/28/22 0656  INR 1.1 1.1 1.1    BMP: Recent Labs    07/28/22 1229 08/28/22 0617 08/28/22 0654 09/01/22 1118 09/29/22 0833  NA 140 136 141 137 140  K 4.0 3.4* 3.5 3.4* 3.8  CL 106 104 106 107 107  CO2 25 22  --  27 27  GLUCOSE 98 93 93 94 103*  BUN 15 15 18 14 20   CALCIUM 9.6 9.0  --  9.5 9.6  CREATININE 1.03* 0.96 0.90 1.11* 1.02*  GFRNONAA >60 >60  --  58* >60    LIVER FUNCTION TESTS: Recent Labs    07/28/22 1229 08/28/22 0617 09/01/22 1118 09/29/22 0833  BILITOT 0.6 0.6 0.6 0.5  AST 15 24 13* 10*  ALT 14 32 16 11  ALKPHOS 50 51 55 59  PROT 7.1 6.6 6.7 6.5  ALBUMIN 4.5 3.8 4.1 4.3    TUMOR MARKERS: No results for input(s): "AFPTM", "CEA", "CA199", "CHROMGRNA" in the last 8760 hours.  Assessment and Plan: 57 y.o. female with past medical history of iron deficiency anemia, anxiety/depression,  GERD, hyperlipidemia, nephrolithiasis and metastatic right breast cancer.  She is scheduled today for Port-A-Cath placement for IV medication use/iron infusions.Risks and benefits of image guided port-a-catheter placement was discussed with the patient including, but not limited to bleeding, infection, pneumothorax, or fibrin sheath development and need for additional procedures.  All of the patient's questions were answered, patient is agreeable to proceed. Consent signed and in chart.    Thank you for this interesting consult.  I greatly enjoyed meeting Caitlin Greene and look forward to participating in their care.  A copy of this report was sent to the requesting provider on this date.  Electronically Signed: D. Jeananne Rama,  PA-C 09/30/2022, 3:56 PM   I spent a total of  25 minutes   in face to face in clinical consultation, greater than 50% of which was counseling/coordinating care for port a cath placement

## 2022-09-30 NOTE — Progress Notes (Unsigned)
Patient ID: Caitlin Greene, female   DOB: [birth year], 57 y.o.   MRN: [id number]        Chief Complaint: follow up acute upper resp infection, right ear pain, vertigo, stage 4 breast ca, low b12       HPI:  Caitlin Greene is a 57 y.o. female  Here with 2-3 days acute onset fever, facial pain, pressure, headache, general weakness and malaise, and greenish d/c, with mild ST and cough and vertigo and right ear muffled hearing and popping, crackling, but pt denies chest pain, wheezing, increased sob or doe, orthopnea, PND, increased LE swelling, palpitations, dizziness or syncope.   Pt denies polydipsia, polyuria, or new focal neuro s/s.   Pt for regular PET scans, tx with ibrance and stable stage 4 malignancy.         Wt Readings from Last 3 Encounters:  09/30/22 199 lb (90.3 kg)  09/29/22 199 lb 1.9 oz (90.3 kg)  09/01/22 195 lb 12.8 oz (88.8 kg)   BP Readings from Last 3 Encounters:  09/30/22 124/76  09/29/22 131/86  09/01/22 115/88         Past Medical History:  Diagnosis Date   ALLERGIC RHINITIS    ANEMIA-IRON DEFICIENCY    ANXIETY    BREAST CANCER, HX OF 01/21/2007   at 57yo   DEPRESSION    GERD    HYPERLIPIDEMIA    Metastatic cancer to bone (HCC) dx'd 10/24/2019   recurrent breast ca   Pneumonia 02/2022   RENAL CALCULUS    rt breast ca dx'd 1988   Past Surgical History:  Procedure Laterality Date   BIOPSY  03/21/2020   Procedure: BIOPSY;  Surgeon: Tressia Danas, MD;  Location: [facility];  Service: Gastroenterology;;   BIOPSY  09/13/2021   Procedure: BIOPSY;  Surgeon: Rachael Fee, MD;  Location: [facility];  Service: Gastroenterology;;   BIOPSY  10/19/2021   Procedure: BIOPSY;  Surgeon: Tressia Danas, MD;  Location: [facility];  Service: Gastroenterology;;   BREAST ENHANCEMENT SURGERY     COLONOSCOPY WITH PROPOFOL N/A 10/19/2021   Procedure: COLONOSCOPY WITH PROPOFOL;  Surgeon: Tressia Danas, MD;  Location: [facility];  Service: Gastroenterology;   Laterality: N/A;   ENTEROSCOPY N/A 10/19/2021   Procedure: ENTEROSCOPY;  Surgeon: Tressia Danas, MD;  Location: [facility];  Service: Gastroenterology;  Laterality: N/A;   ESOPHAGOGASTRODUODENOSCOPY N/A 10/16/2021   Procedure: ESOPHAGOGASTRODUODENOSCOPY (EGD);  Surgeon: Jenel Lucks, MD;  Location: [facility];  Service: Gastroenterology;  Laterality: N/A;   ESOPHAGOGASTRODUODENOSCOPY (EGD) WITH PROPOFOL N/A 03/21/2020   Procedure: ESOPHAGOGASTRODUODENOSCOPY (EGD) WITH PROPOFOL;  Surgeon: Tressia Danas, MD;  Location: [facility];  Service: Gastroenterology;  Laterality: N/A;   ESOPHAGOGASTRODUODENOSCOPY (EGD) WITH PROPOFOL N/A 09/13/2021   Procedure: ESOPHAGOGASTRODUODENOSCOPY (EGD) WITH PROPOFOL;  Surgeon: Rachael Fee, MD;  Location: [facility];  Service: Gastroenterology;  Laterality: N/A;   MASTECTOMY     right   POLYPECTOMY  10/19/2021   Procedure: POLYPECTOMY;  Surgeon: Tressia Danas, MD;  Location: [facility];  Service: Gastroenterology;;   TEMPOROMANDIBULAR JOINT SURGERY     Left   TONSILLECTOMY      reports that she has never smoked. She has never used smokeless tobacco. She reports that she does not drink alcohol and does not use drugs. family history includes Asthma in her sister; COPD in her father; Cancer in her mother; Colon polyps in her father and mother; Dementia in her mother; Diabetes in an other family member; Hypertension in her mother and another family member.  Allergies  Allergen Reactions   Amoxicillin-Pot Clavulanate Hives and Itching   Iron Anaphylaxis and Other (See Comments)    Patient develops hypotension, resp distress after infusion of feraheme.    Penicillins Hives, Itching and Other (See Comments)   Codeine Nausea And Vomiting    Needs pre-meds    Current Outpatient Medications on File Prior to Visit  Medication Sig Dispense Refill   acetaminophen (TYLENOL) 650 MG CR tablet Take 650 mg by mouth every 8 (eight) hours as needed  for pain.     calcium carbonate (OS-CAL) 600 MG tablet Take 1 tablet (600 mg total) by mouth 2 (two) times daily. 30 tablet 0   Ferrous Sulfate (IRON) 325 (65 Fe) MG TABS Take 1 tablet (325 mg total) by mouth daily at 2 PM. 30 tablet 0   FLUoxetine (PROZAC) 20 MG capsule TAKE 3 CAPSULES(60 MG) BY MOUTH DAILY 270 capsule 3   fulvestrant (FASLODEX) 250 MG/5ML injection Inject 500 mg into the muscle every 30 (thirty) days. One injection each buttock over 1-2 minutes. Warm prior to use.     IBRANCE 100 MG tablet TAKE 1 TABLET DAILY ON DAYS 1 THROUGH 21 OF CYCLE, 7 DAYS OFF. REPEAT 21 tablet 0   oxyCODONE (OXY IR/ROXICODONE) 5 MG immediate release tablet Take 1 tablet (5 mg total) by mouth every 8 (eight) hours as needed for severe pain. 50 tablet 0   pantoprazole (PROTONIX) 40 MG tablet Take 1 tablet (40 mg total) by mouth 2 (two) times daily. 60 tablet 2   vitamin B-12 (CYANOCOBALAMIN) 1000 MCG tablet Take 1 tablet (1,000 mcg total) by mouth daily. 30 tablet 0   Wheat Dextrin (BENEFIBER DRINK MIX PO) Take by mouth daily at 6 (six) AM.     No current facility-administered medications on file prior to visit.        ROS:  All others reviewed and negative.  Objective        PE:  BP 124/76 (BP Location: Right Arm, Patient Position: Sitting, Cuff Size: Normal)   Pulse 73   Temp 98.1 F (36.7 C) (Oral)   Ht 5\' 8"  (1.727 m)   Wt 199 lb (90.3 kg)   LMP 06/09/2017 (Exact Date)   SpO2 97%   BMI 30.26 kg/m                 Constitutional: Pt appears in NAD               HENT: Head: NCAT.                Right Ear: External ear normal.                 Left Ear: External ear normal. Bilat tm's with mild erythema.  Max sinus areas mild tender.  Pharynx with mild erythema, no exudate               Eyes: . Pupils are equal, round, and reactive to light. Conjunctivae and EOM are normal               Nose: without d/c or deformity               Neck: Neck supple. Gross normal ROM                Cardiovascular: Normal rate and regular rhythm.                 Pulmonary/Chest: Effort normal and breath sounds without rales  or wheezing.                               Neurological: Pt is alert. At baseline orientation, motor grossly intact               Skin: Skin is warm. No rashes, no other new lesions, LE edema - none               Psychiatric: Pt behavior is normal without agitation   Micro: none  Cardiac tracings I have personally interpreted today:  none  Pertinent Radiological findings (summarize): none   Lab Results  Component Value Date   WBC 2.8 (L) 09/29/2022   HGB 11.5 (L) 09/29/2022   HCT 33.8 (L) 09/29/2022   PLT 192 09/29/2022   GLUCOSE 103 (H) 09/29/2022   CHOL 212 (H) 04/12/2018   TRIG 229.0 (H) 04/12/2018   HDL 52.00 04/12/2018   LDLDIRECT 141.0 04/12/2018   LDLCALC 129 (H) 04/12/2013   ALT 11 09/29/2022   AST 10 (L) 09/29/2022   NA 140 09/29/2022   K 3.8 09/29/2022   CL 107 09/29/2022   CREATININE 1.02 (H) 09/29/2022   BUN 20 09/29/2022   CO2 27 09/29/2022   TSH 4.846 (H) 10/15/2021   INR 1.1 08/28/2022   HGBA1C 4.3 (L) 10/15/2021   POCT - COVID - neg  Assessment/Plan:  Caitlin Greene is a 57 y.o. White or Caucasian [1] female with  has a past medical history of ALLERGIC RHINITIS, ANEMIA-IRON DEFICIENCY, ANXIETY, BREAST CANCER, HX OF (01/21/2007), DEPRESSION, GERD, HYPERLIPIDEMIA, Metastatic cancer to bone (HCC) (dx'd 10/24/2019), Pneumonia (02/2022), RENAL CALCULUS, and rt breast ca (dx'd 1988).  Metastatic cancer to bone (HCC) Stable overall, cont ibrance, PET scans, f/u oncology  Vertigo Mild to mod, for meclizine prn,  to f/u any worsening symptoms or concerns  Acute upper respiratory infection Mild to mod, for antibx course zpack, cough med prn, to f/u any worsening symptoms or concerns  ETD (eustachian tube dysfunction) Right side, also for otc mucinex bid prn  Acute dysfunction of right eustachian tube Also for mucinex bid prn  B12  deficiency Lab Results  Component Value Date   VITAMINB12 337 02/01/2022   Stable, cont oral replacement - b12 1000 mcg qd   Followup: Return if symptoms worsen or fail to improve.  Oliver Barre, MD 10/02/2022 2:32 PM [facility]

## 2022-10-01 ENCOUNTER — Other Ambulatory Visit: Payer: Self-pay | Admitting: Physician Assistant

## 2022-10-02 ENCOUNTER — Encounter: Payer: Self-pay | Admitting: Internal Medicine

## 2022-10-02 NOTE — Assessment & Plan Note (Signed)
Mild to mod, for meclizine prn,  to f/u any worsening symptoms or concerns 

## 2022-10-02 NOTE — Assessment & Plan Note (Signed)
Also for mucinex bid prn 

## 2022-10-02 NOTE — Assessment & Plan Note (Addendum)
Mild to mod, for antibx course zpack, cough med prn,  to f/u any worsening symptoms or concerns 

## 2022-10-02 NOTE — Assessment & Plan Note (Signed)
Lab Results  Component Value Date   VITAMINB12 337 02/01/2022   Stable, cont oral replacement - b12 1000 mcg qd  

## 2022-10-02 NOTE — Assessment & Plan Note (Signed)
Stable overall, cont ibrance, PET scans, f/u oncology

## 2022-10-02 NOTE — Assessment & Plan Note (Signed)
Right side, also for otc mucinex bid prn

## 2022-10-04 ENCOUNTER — Ambulatory Visit (HOSPITAL_COMMUNITY)
Admission: RE | Admit: 2022-10-04 | Discharge: 2022-10-04 | Disposition: A | Discharge status: Home / Self Care | Payer: Medicare HMO | Source: Ambulatory Visit | Attending: Hematology & Oncology | Admitting: Hematology & Oncology

## 2022-10-04 ENCOUNTER — Other Ambulatory Visit: Payer: Self-pay | Admitting: Hematology & Oncology

## 2022-10-04 ENCOUNTER — Encounter (HOSPITAL_COMMUNITY): Payer: Self-pay

## 2022-10-04 DIAGNOSIS — Z9011 Acquired absence of right breast and nipple: Secondary | ICD-10-CM | POA: Insufficient documentation

## 2022-10-04 DIAGNOSIS — M79605 Pain in left leg: Secondary | ICD-10-CM

## 2022-10-04 DIAGNOSIS — Z853 Personal history of malignant neoplasm of breast: Secondary | ICD-10-CM | POA: Diagnosis not present

## 2022-10-04 DIAGNOSIS — C7951 Secondary malignant neoplasm of bone: Secondary | ICD-10-CM

## 2022-10-04 DIAGNOSIS — C50919 Malignant neoplasm of unspecified site of unspecified female breast: Secondary | ICD-10-CM | POA: Diagnosis not present

## 2022-10-04 DIAGNOSIS — Z452 Encounter for adjustment and management of vascular access device: Secondary | ICD-10-CM | POA: Diagnosis not present

## 2022-10-04 HISTORY — PX: IR IMAGING GUIDED PORT INSERTION: IMG5740

## 2022-10-04 MED ORDER — MIDAZOLAM HCL 2 MG/2ML IJ SOLN
INTRAMUSCULAR | Status: AC
  Filled 2022-10-04: qty 2

## 2022-10-04 MED ORDER — LIDOCAINE HCL 1 % IJ SOLN: 20.0000 mL | Freq: Once | INTRAMUSCULAR | Status: DC

## 2022-10-04 MED ORDER — LIDOCAINE-EPINEPHRINE 1 %-1:100000 IJ SOLN
INTRAMUSCULAR | Status: AC
  Filled 2022-10-04: qty 1

## 2022-10-04 MED ORDER — MIDAZOLAM HCL 2 MG/2ML IJ SOLN
INTRAMUSCULAR | Status: DC | PRN
  Administered 2022-10-04 (×3): 1 mg via INTRAVENOUS

## 2022-10-04 MED ORDER — LIDOCAINE-EPINEPHRINE 1 %-1:100000 IJ SOLN: 20.0000 mL | Freq: Once | INTRAMUSCULAR | Status: DC

## 2022-10-04 MED ORDER — HEPARIN SOD (PORK) LOCK FLUSH 100 UNIT/ML IV SOLN: 500.0000 [IU] | Freq: Once | INTRAVENOUS | Status: DC

## 2022-10-04 MED ORDER — HEPARIN SOD (PORK) LOCK FLUSH 100 UNIT/ML IV SOLN
INTRAVENOUS | Status: AC
  Filled 2022-10-04: qty 5

## 2022-10-04 MED ORDER — FENTANYL CITRATE (PF) 100 MCG/2ML IJ SOLN
INTRAMUSCULAR | Status: DC | PRN
  Administered 2022-10-04 (×3): 50 ug via INTRAVENOUS

## 2022-10-04 MED ORDER — SODIUM CHLORIDE 0.9 % IV SOLN: INTRAVENOUS | Status: DC

## 2022-10-04 MED ORDER — FENTANYL CITRATE (PF) 100 MCG/2ML IJ SOLN
INTRAMUSCULAR | Status: AC
  Filled 2022-10-04: qty 2

## 2022-10-04 MED ORDER — LIDOCAINE HCL 1 % IJ SOLN
INTRAMUSCULAR | Status: AC
  Filled 2022-10-04: qty 20

## 2022-10-04 NOTE — Discharge Instructions (Signed)
Discharge Instructions:   Please call Interventional Radiology clinic [phone] with any questions or concerns.  You may remove your Band-Aid and dressing tomorrow and shower.  Do not use EMLA / Lidocaine cream for 2 weeks post Port Insertion this will remove the surgical glue.   Implanted Port Insertion, Care After The following information offers guidance on how to care for yourself after your procedure. Your health care provider may also give you more specific instructions. If you have problems or questions, contact your health care provider. What can I expect after the procedure? After the procedure, it is common to have: Discomfort at the port insertion site. Bruising on the skin over the port. This should improve over 3-4 days. Follow these instructions at home: [facility] care After your port is placed, you will get a manufacturer's information card. The card has information about your port. Keep this card with you at all times. Take care of the port as told by your health care provider. Ask your health care provider if you or a family member can get training for taking care of the port at home. A home health care nurse will be be available to help care for the port. Make sure to remember what type of port you have. Incision care     Follow instructions from your health care provider about how to take care of your port insertion site. Make sure you: Wash your hands with soap and water for at least 20 seconds before and after you change your bandage (dressing). If soap and water are not available, use hand sanitizer. Change your dressing as told by your health care provider. Leave stitches (sutures), skin glue, or adhesive strips in place. These skin closures may need to stay in place for 2 weeks or longer. If adhesive strip edges start to loosen and curl up, you may trim the loose edges. Do not remove adhesive strips completely unless your health care provider tells you to do  that. Check your port insertion site every day for signs of infection. Check for: Redness, swelling, or pain. Fluid or blood. Warmth. Pus or a bad smell. Activity Return to your normal activities as told by your health care provider. Ask your health care provider what activities are safe for you. You may have to avoid lifting. Ask your health care provider how much you can safely lift. General instructions Take over-the-counter and prescription medicines only as told by your health care provider. Do not take baths, swim, or use a hot tub until your health care provider approves. Ask your health care provider if you may take showers. You may only be allowed to take sponge baths. If you were given a sedative during the procedure, it can affect you for several hours. Do not drive or operate machinery until your health care provider says that it is safe. Wear a medical alert bracelet in case of an emergency. This will tell any health care providers that you have a port. Keep all follow-up visits. This is important. Contact a health care provider if: You cannot flush your port with saline as directed, or you cannot draw blood from the port. You have a fever or chills. You have redness, swelling, or pain around your port insertion site. You have fluid or blood coming from your port insertion site. Your port insertion site feels warm to the touch. You have pus or a bad smell coming from the port insertion site. Get help right away if: You have chest pain or  shortness of breath. You have bleeding from your port that you cannot control. These symptoms may be an emergency. Get help right away. Call 911. Do not wait to see if the symptoms will go away. Do not drive yourself to the hospital. Summary Take care of the port as told by your health care provider. Keep the manufacturer's information card with you at all times. Change your dressing as told by your health care provider. Contact a health  care provider if you have a fever or chills or if you have redness, swelling, or pain around your port insertion site. Keep all follow-up visits. This information is not intended to replace advice given to you by your health care provider. Make sure you discuss any questions you have with your health care provider. Document Revised: 10/27/2020 Document Reviewed: 10/27/2020 Elsevier Patient Education  2023 Elsevier Inc.   Moderate Conscious Sedation, Adult, Care After This sheet gives you information about how to care for yourself after your procedure. Your health care provider may also give you more specific instructions. If you have problems or questions, contact your health care provider. What can I expect after the procedure? After the procedure, it is common to have: Sleepiness for several hours. Impaired judgment for several hours. Difficulty with balance. Vomiting if you eat too soon. Follow these instructions at home: For the time period you were told by your health care provider:     Rest. Do not participate in activities where you could fall or become injured. Do not drive or use machinery. Do not drink alcohol. Do not take sleeping pills or medicines that cause drowsiness. Do not make important decisions or sign legal documents. Do not take care of children on your own. Eating and drinking  Follow the diet recommended by your health care provider. Drink enough fluid to keep your urine pale yellow. If you vomit: Drink water, juice, or soup when you can drink without vomiting. Make sure you have little or no nausea before eating solid foods. General instructions Take over-the-counter and prescription medicines only as told by your health care provider. Have a responsible adult stay with you for the time you are told. It is important to have someone help care for you until you are awake and alert. Do not smoke. Keep all follow-up visits as told by your health care  provider. This is important. Contact a health care provider if: You are still sleepy or having trouble with balance after 24 hours. You feel light-headed. You keep feeling nauseous or you keep vomiting. You develop a rash. You have a fever. You have redness or swelling around the IV site. Get help right away if: You have trouble breathing. You have new-onset confusion at home. Summary After the procedure, it is common to feel sleepy, have impaired judgment, or feel nauseous if you eat too soon. Rest after you get home. Know the things you should not do after the procedure. Follow the diet recommended by your health care provider and drink enough fluid to keep your urine pale yellow. Get help right away if you have trouble breathing or new-onset confusion at home. This information is not intended to replace advice given to you by your health care provider. Make sure you discuss any questions you have with your health care provider. Document Revised: 08/23/2019 Document Reviewed: 03/21/2019 Elsevier Patient Education  2023 [employer].

## 2022-10-04 NOTE — Procedures (Signed)
Interventional Radiology Procedure:   Indications: History of breast cancer and poor venous access  Procedure: Port placement  Findings: Left jugular port, tip in SVC.  Complications: None     EBL: Minimal, less than 10 ml  Plan: Keep port site and incisions dry for at least 24 hours.     Bedford Winsor R. Lowella Dandy, MD  Pager: [phone]

## 2022-10-07 ENCOUNTER — Inpatient Hospital Stay: Payer: Medicare HMO

## 2022-10-07 VITALS — BP 125/77 | HR 76 | Temp 97.6°F | Resp 16

## 2022-10-07 DIAGNOSIS — C7951 Secondary malignant neoplasm of bone: Secondary | ICD-10-CM | POA: Diagnosis not present

## 2022-10-07 DIAGNOSIS — Z170 Estrogen receptor positive status [ER+]: Secondary | ICD-10-CM | POA: Diagnosis not present

## 2022-10-07 DIAGNOSIS — Z79899 Other long term (current) drug therapy: Secondary | ICD-10-CM | POA: Diagnosis not present

## 2022-10-07 DIAGNOSIS — C50919 Malignant neoplasm of unspecified site of unspecified female breast: Secondary | ICD-10-CM

## 2022-10-07 DIAGNOSIS — Z86000 Personal history of in-situ neoplasm of breast: Secondary | ICD-10-CM | POA: Diagnosis not present

## 2022-10-07 DIAGNOSIS — C50011 Malignant neoplasm of nipple and areola, right female breast: Secondary | ICD-10-CM | POA: Diagnosis not present

## 2022-10-07 MED ORDER — SODIUM CHLORIDE 0.9% FLUSH
10.0000 mL | Freq: Once | INTRAVENOUS | Status: AC | PRN
  Administered 2022-10-07: 10 mL

## 2022-10-07 MED ORDER — SODIUM CHLORIDE 0.9 % IV SOLN
300.0000 mg | Freq: Once | INTRAVENOUS | Status: AC
  Administered 2022-10-07: 300 mg via INTRAVENOUS
  Filled 2022-10-07: qty 300

## 2022-10-07 MED ORDER — HEPARIN SOD (PORK) LOCK FLUSH 100 UNIT/ML IV SOLN
500.0000 [IU] | Freq: Once | INTRAVENOUS | Status: AC | PRN
  Administered 2022-10-07: 500 [IU]

## 2022-10-07 MED ORDER — SODIUM CHLORIDE 0.9 % IV SOLN: Freq: Once | INTRAVENOUS | Status: AC

## 2022-10-07 MED ORDER — LORATADINE 10 MG PO TABS
10.0000 mg | ORAL_TABLET | Freq: Once | ORAL | Status: AC
  Administered 2022-10-07: 10 mg via ORAL
  Filled 2022-10-07: qty 1

## 2022-10-07 MED ORDER — METHYLPREDNISOLONE SODIUM SUCC 125 MG IJ SOLR
125.0000 mg | Freq: Once | INTRAMUSCULAR | Status: AC
  Administered 2022-10-07: 125 mg via INTRAVENOUS
  Filled 2022-10-07: qty 2

## 2022-10-07 NOTE — Patient Instructions (Signed)

## 2022-10-14 ENCOUNTER — Inpatient Hospital Stay: Payer: Medicare HMO | Attending: Hematology & Oncology

## 2022-10-14 VITALS — BP 134/81 | HR 80 | Temp 97.7°F | Resp 16

## 2022-10-14 DIAGNOSIS — C50919 Malignant neoplasm of unspecified site of unspecified female breast: Secondary | ICD-10-CM

## 2022-10-14 DIAGNOSIS — Z170 Estrogen receptor positive status [ER+]: Secondary | ICD-10-CM | POA: Diagnosis not present

## 2022-10-14 DIAGNOSIS — D509 Iron deficiency anemia, unspecified: Secondary | ICD-10-CM | POA: Insufficient documentation

## 2022-10-14 DIAGNOSIS — C50011 Malignant neoplasm of nipple and areola, right female breast: Secondary | ICD-10-CM | POA: Diagnosis not present

## 2022-10-14 DIAGNOSIS — C7951 Secondary malignant neoplasm of bone: Secondary | ICD-10-CM | POA: Diagnosis not present

## 2022-10-14 DIAGNOSIS — Z79818 Long term (current) use of other agents affecting estrogen receptors and estrogen levels: Secondary | ICD-10-CM | POA: Insufficient documentation

## 2022-10-14 DIAGNOSIS — Z79899 Other long term (current) drug therapy: Secondary | ICD-10-CM | POA: Insufficient documentation

## 2022-10-14 MED ORDER — HEPARIN SOD (PORK) LOCK FLUSH 100 UNIT/ML IV SOLN
500.0000 [IU] | Freq: Once | INTRAVENOUS | Status: AC | PRN
  Administered 2022-10-14: 500 [IU]

## 2022-10-14 MED ORDER — METHYLPREDNISOLONE SODIUM SUCC 125 MG IJ SOLR
125.0000 mg | Freq: Once | INTRAMUSCULAR | Status: AC
  Administered 2022-10-14: 125 mg via INTRAVENOUS
  Filled 2022-10-14: qty 2

## 2022-10-14 MED ORDER — SODIUM CHLORIDE 0.9 % IV SOLN
300.0000 mg | Freq: Once | INTRAVENOUS | Status: AC
  Administered 2022-10-14: 300 mg via INTRAVENOUS
  Filled 2022-10-14: qty 300

## 2022-10-14 MED ORDER — SODIUM CHLORIDE 0.9% FLUSH
10.0000 mL | Freq: Once | INTRAVENOUS | Status: AC | PRN
  Administered 2022-10-14: 10 mL

## 2022-10-14 MED ORDER — SODIUM CHLORIDE 0.9 % IV SOLN: Freq: Once | INTRAVENOUS | Status: AC

## 2022-10-14 MED ORDER — LORATADINE 10 MG PO TABS
10.0000 mg | ORAL_TABLET | Freq: Once | ORAL | Status: AC
  Administered 2022-10-14: 10 mg via ORAL
  Filled 2022-10-14: qty 1

## 2022-10-14 NOTE — Patient Instructions (Signed)

## 2022-10-15 ENCOUNTER — Encounter: Payer: Self-pay | Admitting: Hematology & Oncology

## 2022-10-21 ENCOUNTER — Inpatient Hospital Stay: Payer: Medicare HMO

## 2022-10-21 VITALS — BP 121/67 | HR 76 | Resp 16

## 2022-10-21 DIAGNOSIS — C50011 Malignant neoplasm of nipple and areola, right female breast: Secondary | ICD-10-CM | POA: Diagnosis not present

## 2022-10-21 DIAGNOSIS — D509 Iron deficiency anemia, unspecified: Secondary | ICD-10-CM | POA: Diagnosis not present

## 2022-10-21 DIAGNOSIS — Z170 Estrogen receptor positive status [ER+]: Secondary | ICD-10-CM | POA: Diagnosis not present

## 2022-10-21 DIAGNOSIS — Z79818 Long term (current) use of other agents affecting estrogen receptors and estrogen levels: Secondary | ICD-10-CM | POA: Diagnosis not present

## 2022-10-21 DIAGNOSIS — Z79899 Other long term (current) drug therapy: Secondary | ICD-10-CM | POA: Diagnosis not present

## 2022-10-21 DIAGNOSIS — C7951 Secondary malignant neoplasm of bone: Secondary | ICD-10-CM | POA: Diagnosis not present

## 2022-10-21 DIAGNOSIS — C50919 Malignant neoplasm of unspecified site of unspecified female breast: Secondary | ICD-10-CM

## 2022-10-21 MED ORDER — METHYLPREDNISOLONE SODIUM SUCC 125 MG IJ SOLR
125.0000 mg | Freq: Once | INTRAMUSCULAR | Status: AC
  Administered 2022-10-21: 125 mg via INTRAVENOUS
  Filled 2022-10-21: qty 2

## 2022-10-21 MED ORDER — SODIUM CHLORIDE 0.9 % IV SOLN: Freq: Once | INTRAVENOUS | Status: AC

## 2022-10-21 MED ORDER — SODIUM CHLORIDE 0.9 % IV SOLN
300.0000 mg | Freq: Once | INTRAVENOUS | Status: AC
  Administered 2022-10-21: 300 mg via INTRAVENOUS
  Filled 2022-10-21: qty 300

## 2022-10-21 MED ORDER — HEPARIN SOD (PORK) LOCK FLUSH 100 UNIT/ML IV SOLN: 500.0000 [IU] | Freq: Once | INTRAVENOUS | Status: DC | PRN

## 2022-10-21 MED ORDER — LORATADINE 10 MG PO TABS
10.0000 mg | ORAL_TABLET | Freq: Once | ORAL | Status: AC
  Administered 2022-10-21: 10 mg via ORAL
  Filled 2022-10-21: qty 1

## 2022-10-21 MED ORDER — SODIUM CHLORIDE 0.9% FLUSH: 10.0000 mL | Freq: Once | INTRAVENOUS | Status: DC | PRN

## 2022-10-21 NOTE — Patient Instructions (Signed)

## 2022-10-27 ENCOUNTER — Telehealth: Payer: Self-pay

## 2022-10-27 ENCOUNTER — Inpatient Hospital Stay: Payer: Medicare HMO

## 2022-10-27 ENCOUNTER — Encounter: Payer: Self-pay | Admitting: Hematology & Oncology

## 2022-10-27 ENCOUNTER — Other Ambulatory Visit: Payer: Self-pay

## 2022-10-27 ENCOUNTER — Other Ambulatory Visit (HOSPITAL_COMMUNITY): Payer: Self-pay

## 2022-10-27 ENCOUNTER — Inpatient Hospital Stay: Payer: Medicare HMO | Admitting: Hematology & Oncology

## 2022-10-27 VITALS — BP 123/80 | HR 92 | Temp 98.0°F | Resp 18 | Ht 68.0 in | Wt 201.0 lb

## 2022-10-27 DIAGNOSIS — C50919 Malignant neoplasm of unspecified site of unspecified female breast: Secondary | ICD-10-CM

## 2022-10-27 DIAGNOSIS — Z79818 Long term (current) use of other agents affecting estrogen receptors and estrogen levels: Secondary | ICD-10-CM | POA: Diagnosis not present

## 2022-10-27 DIAGNOSIS — D509 Iron deficiency anemia, unspecified: Secondary | ICD-10-CM | POA: Diagnosis not present

## 2022-10-27 DIAGNOSIS — C50012 Malignant neoplasm of nipple and areola, left female breast: Secondary | ICD-10-CM

## 2022-10-27 DIAGNOSIS — C50011 Malignant neoplasm of nipple and areola, right female breast: Secondary | ICD-10-CM | POA: Diagnosis not present

## 2022-10-27 DIAGNOSIS — Z170 Estrogen receptor positive status [ER+]: Secondary | ICD-10-CM | POA: Diagnosis not present

## 2022-10-27 DIAGNOSIS — Z79899 Other long term (current) drug therapy: Secondary | ICD-10-CM | POA: Diagnosis not present

## 2022-10-27 DIAGNOSIS — C7951 Secondary malignant neoplasm of bone: Secondary | ICD-10-CM

## 2022-10-27 LAB — CMP (CANCER CENTER ONLY)
ALT: 19 U/L (ref 0–44)
AST: 16 U/L (ref 15–41)
Albumin: 4.1 g/dL (ref 3.5–5.0)
Alkaline Phosphatase: 74 U/L (ref 38–126)
Anion gap: 7 (ref 5–15)
BUN: 12 mg/dL (ref 6–20)
CO2: 27 mmol/L (ref 22–32)
Calcium: 9.5 mg/dL (ref 8.9–10.3)
Chloride: 104 mmol/L (ref 98–111)
Creatinine: 0.96 mg/dL (ref 0.44–1.00)
GFR, Estimated: >60 mL/min (ref >60)
Glucose, Bld: 106 mg/dL — ABNORMAL HIGH (ref 70–99)
Potassium: 4.3 mmol/L (ref 3.5–5.1)
Sodium: 138 mmol/L (ref 135–145)
Total Bilirubin: 0.5 mg/dL (ref 0.3–1.2)
Total Protein: 6.4 g/dL — ABNORMAL LOW (ref 6.5–8.1)

## 2022-10-27 LAB — CBC WITH DIFFERENTIAL (CANCER CENTER ONLY)
Abs Immature Granulocytes: 0.06 10*3/uL (ref 0.00–0.07)
Basophils Absolute: 0.1 10*3/uL (ref 0.0–0.1)
Basophils Relative: 1 %
Eosinophils Absolute: 0.2 10*3/uL (ref 0.0–0.5)
Eosinophils Relative: 4 %
HCT: 37.3 % (ref 36.0–46.0)
Hemoglobin: 12.6 g/dL (ref 12.0–15.0)
Immature Granulocytes: 1 %
Lymphocytes Relative: 21 %
Lymphs Abs: 1.2 10*3/uL (ref 0.7–4.0)
MCH: 33.8 pg (ref 26.0–34.0)
MCHC: 33.8 g/dL (ref 30.0–36.0)
MCV: 100 fL (ref 80.0–100.0)
Monocytes Absolute: 0.5 10*3/uL (ref 0.1–1.0)
Monocytes Relative: 9 %
Neutro Abs: 3.8 10*3/uL (ref 1.7–7.7)
Neutrophils Relative %: 64 %
Platelet Count: 205 10*3/uL (ref 150–400)
RBC: 3.73 MIL/uL — ABNORMAL LOW (ref 3.87–5.11)
RDW: 14.5 % (ref 11.5–15.5)
WBC Count: 5.8 10*3/uL (ref 4.0–10.5)
nRBC: 0 % (ref 0.0–0.2)

## 2022-10-27 LAB — IRON AND IRON BINDING CAPACITY (CC-WL,HP ONLY)
Iron: 101 ug/dL (ref 28–170)
Saturation Ratios: 33 % — ABNORMAL HIGH (ref 10.4–31.8)
TIBC: 302 ug/dL (ref 250–450)
UIBC: 201 ug/dL (ref 148–442)

## 2022-10-27 LAB — FERRITIN: Ferritin: 304 ng/mL (ref 11–307)

## 2022-10-27 MED ORDER — FULVESTRANT 250 MG/5ML IM SOSY
500.0000 mg | PREFILLED_SYRINGE | INTRAMUSCULAR | Status: DC
  Administered 2022-10-27: 500 mg via INTRAMUSCULAR

## 2022-10-27 MED ORDER — HEPARIN SOD (PORK) LOCK FLUSH 100 UNIT/ML IV SOLN
500.0000 [IU] | Freq: Once | INTRAVENOUS | Status: AC
  Administered 2022-10-27: 500 [IU] via INTRAVENOUS

## 2022-10-27 MED ORDER — SODIUM CHLORIDE 0.9% FLUSH
10.0000 mL | Freq: Once | INTRAVENOUS | Status: AC
  Administered 2022-10-27: 10 mL via INTRAVENOUS

## 2022-10-27 MED ORDER — DENOSUMAB 120 MG/1.7ML ~~LOC~~ SOLN
120.0000 mg | Freq: Once | SUBCUTANEOUS | Status: DC
  Filled 2022-10-27: qty 1.7

## 2022-10-27 NOTE — Telephone Encounter (Signed)
Oral Oncology Patient Advocate Encounter  Change in insurance   Received notification that prior authorization for Ilda Foil is required.   PA submitted on 10/27/2022  Key [id number]  Status is pending     Ardeen Fillers, CPhT Oncology Pharmacy Patient Advocate  [facility]  [phone] (phone) [phone] (fax) 10/27/2022 10:59 AM

## 2022-10-27 NOTE — Patient Instructions (Signed)

## 2022-10-27 NOTE — Telephone Encounter (Signed)
Oral Oncology Patient Advocate Encounter   Began application for assistance for Ibrance through [employer] Oncology Together Patient Assistance Program.   Application will be submitted upon completion of necessary supporting documentation.   [employer]'s phone number [phone].   I will continue to check the status until final determination.    Ardeen Fillers, CPhT Oncology Pharmacy Patient Advocate  [facility]  [phone] (phone) [phone] (fax) 10/27/2022 11:09 AM

## 2022-10-27 NOTE — Patient Instructions (Signed)

## 2022-10-27 NOTE — Telephone Encounter (Signed)
Oral Oncology Patient Advocate Encounter  Prior Authorization for Ilda Foil has been approved.    PA# [id number]  Effective dates: 05/09/22 through 05/09/23  Patients co-pay is $3,328.34.  PAP has been initiated. See additional encounter.     Ardeen Fillers, CPhT Oncology Pharmacy Patient Advocate  [facility]  [phone] (phone) [phone] (fax) 10/27/2022 11:05 AM

## 2022-10-27 NOTE — Progress Notes (Signed)
Hematology and Oncology Follow Up Visit  Caitlin Greene [id number] Sep 06, 1965 57 y.o. 10/27/2022   Principle Diagnosis:  Metastatic breast cancer-ER positive/HER-2 negative --bone metastasis only Iron deficiency anemia  Current Therapy:   Faslodex 500 mg IM monthly --start on 04/2020 Ibrance 100 mg p.o. daily (21d on/7d off) - start on 04/2020 Xgeva 120 mg subcu every 3 months -next dose 10/2022 IV iron-Venofer given on 10/21/2022       Interim History:  Caitlin Greene is in for follow-up.  She seems to be doing quite well.  She is still watching of her sister and father.  Her sister, unfortunately, is declined because of the  CJD that she has.  I just feel bad for her.  Thankfully, the last CA 27-29 we did on Caitlin Greene was down to 31.  Her pain control seems to be doing pretty well right now.  She has not had no problems with GI bleeding.  We have given her IV iron before.  When we last saw her, her iron saturation was 8%.  We went and give her some Venofer.  She has had no problems with leg swelling.  There is been no rashes.  She has had no headache.  Overall, I would say performance status is probably ECOG 1.    . Wt Readings from Last 3 Encounters:  10/27/22 201 lb (91.2 kg)  10/04/22 199 lb (90.3 kg)  09/30/22 199 lb (90.3 kg)    Medications:  Current Outpatient Medications:    acetaminophen (TYLENOL) 650 MG CR tablet, Take 650 mg by mouth every 8 (eight) hours as needed for pain., Disp: , Rfl:    calcium carbonate (OS-CAL) 600 MG tablet, Take 1 tablet (600 mg total) by mouth 2 (two) times daily., Disp: 30 tablet, Rfl: 0   Ferrous Sulfate (IRON) 325 (65 Fe) MG TABS, Take 1 tablet (325 mg total) by mouth daily at 2 PM., Disp: 30 tablet, Rfl: 0   FLUoxetine (PROZAC) 20 MG capsule, TAKE 3 CAPSULES(60 MG) BY MOUTH DAILY, Disp: 270 capsule, Rfl: 3   fulvestrant (FASLODEX) 250 MG/5ML injection, Inject 500 mg into the muscle every 30 (thirty) days. One injection each buttock over  1-2 minutes. Warm prior to use., Disp: , Rfl:    IBRANCE 100 MG tablet, TAKE 1 TABLET DAILY ON DAYS 1 THROUGH 21 OF CYCLE, 7 DAYS OFF. REPEAT, Disp: 21 tablet, Rfl: 0   meclizine (ANTIVERT) 12.5 MG tablet, Take 1 tablet (12.5 mg total) by mouth 3 (three) times daily as needed for dizziness., Disp: 40 tablet, Rfl: 1   oxyCODONE (OXY IR/ROXICODONE) 5 MG immediate release tablet, Take 1 tablet (5 mg total) by mouth every 8 (eight) hours as needed for severe pain., Disp: 50 tablet, Rfl: 0   pantoprazole (PROTONIX) 40 MG tablet, TAKE 1 TABLET(40 MG) BY MOUTH TWICE DAILY BEFORE A MEAL, Disp: 60 tablet, Rfl: 5   vitamin B-12 (CYANOCOBALAMIN) 1000 MCG tablet, Take 1 tablet (1,000 mcg total) by mouth daily., Disp: 30 tablet, Rfl: 0   Wheat Dextrin (BENEFIBER DRINK MIX PO), Take by mouth daily at 6 (six) AM., Disp: , Rfl:   Allergies:  Allergies  Allergen Reactions   Amoxicillin-Pot Clavulanate Hives and Itching   Iron Anaphylaxis and Other (See Comments)    Patient develops hypotension, resp distress after infusion of feraheme.    Penicillins Hives, Itching and Other (See Comments)   Codeine Nausea And Vomiting    Needs pre-meds     Past Medical History, Surgical  history, Social history, and Family History were reviewed and updated.  Review of Systems: Review of Systems  Constitutional:  Negative for appetite change.  HENT:  Negative.    Eyes: Negative.   Respiratory: Negative.    Cardiovascular: Negative.   Gastrointestinal:  Negative for abdominal pain and nausea.  Endocrine: Negative.   Genitourinary: Negative.    Musculoskeletal:  Positive for arthralgias and back pain.  Skin: Negative.   Neurological:  Negative for dizziness.  Hematological: Negative.   Psychiatric/Behavioral: Negative.      Physical Exam:  height is 5\' 8"  (1.727 m) and weight is 201 lb (91.2 kg). Her oral temperature is 98 F (36.7 C). Her blood pressure is 123/80 and her pulse is 92. Her respiration is 18 and  oxygen saturation is 98%.   Wt Readings from Last 3 Encounters:  10/27/22 201 lb (91.2 kg)  10/04/22 199 lb (90.3 kg)  09/30/22 199 lb (90.3 kg)    Physical Exam Vitals reviewed.  HENT:     Head: Normocephalic and atraumatic.  Eyes:     Pupils: Pupils are equal, round, and reactive to light.  Cardiovascular:     Rate and Rhythm: Normal rate and regular rhythm.     Heart sounds: Normal heart sounds.  Pulmonary:     Effort: Pulmonary effort is normal.     Breath sounds: Normal breath sounds.  Abdominal:     General: Bowel sounds are normal.     Palpations: Abdomen is soft.  Musculoskeletal:        General: No tenderness or deformity. Normal range of motion.     Cervical back: Normal range of motion.  Lymphadenopathy:     Cervical: No cervical adenopathy.  Skin:    General: Skin is warm and dry.     Findings: No erythema or rash.  Neurological:     Mental Status: She is alert and oriented to person, place, and time.  Psychiatric:        Behavior: Behavior normal.        Thought Content: Thought content normal.        Judgment: Judgment normal.     Lab Results  Component Value Date   WBC 5.8 10/27/2022   HGB 12.6 10/27/2022   HCT 37.3 10/27/2022   MCV 100.0 10/27/2022   PLT 205 10/27/2022     Chemistry      Component Value Date/Time   NA 138 10/27/2022 1000   K 4.3 10/27/2022 1000   CL 104 10/27/2022 1000   CO2 27 10/27/2022 1000   BUN 12 10/27/2022 1000   CREATININE 0.96 10/27/2022 1000   CREATININE 0.88 10/22/2019 1246      Component Value Date/Time   CALCIUM 9.5 10/27/2022 1000   ALKPHOS 74 10/27/2022 1000   AST 16 10/27/2022 1000   ALT 19 10/27/2022 1000   BILITOT 0.5 10/27/2022 1000       Impression and Plan: Caitlin Greene is a very charming 57 year old postmenopausal female with metastatic breast cancer.  We actually had seen her many years ago with a carcinoma in situ.  I am glad that she is doing okay.  I know that she wants to be able to take  care of her sister and father.  Today, she will get Faslodex.  Because she now is on Medicare, she cannot get the [place]  This has to be approved.  We probably will give this when we see her back.  We will set up a CT scan  and a bone scan for her.  Will do this in about 2-3 months.  We will get her back in 1 more month.

## 2022-10-27 NOTE — Telephone Encounter (Signed)
Oral Oncology Patient Advocate Encounter  Reached out and spoke with patient regarding PAP paperwork, explained that I would send it to their preferred email via DocuSign.   Confirmed email address: [email or web address]    Patient expressed understanding and consent.  Will follow up once paperwork has been signed and returned.   Ardeen Fillers, CPhT Oncology Pharmacy Patient Advocate  [facility]  [phone] (phone) [phone] (fax) 10/27/2022 12:52 PM

## 2022-10-27 NOTE — Telephone Encounter (Signed)
Oral Oncology Patient Advocate Encounter   Submitted application for assistance for Ilda Foil to [employer] Oncology Together Patient Assistance Program.   Application submitted via e-fax to [phone]   [employer]'s phone number [phone].   I will continue to check the status until final determination.    Ardeen Fillers, CPhT Oncology Pharmacy Patient Advocate  [facility]  [phone] (phone) [phone] (fax) 10/27/2022 3:28 PM

## 2022-10-28 ENCOUNTER — Telehealth: Payer: Self-pay | Admitting: Hematology & Oncology

## 2022-10-28 ENCOUNTER — Other Ambulatory Visit: Payer: Self-pay

## 2022-10-28 ENCOUNTER — Other Ambulatory Visit (HOSPITAL_COMMUNITY): Payer: Self-pay

## 2022-10-28 ENCOUNTER — Encounter: Payer: Self-pay | Admitting: Hematology & Oncology

## 2022-10-28 LAB — CANCER ANTIGEN 27.29: CA 27.29: 47.7 U/mL — ABNORMAL HIGH (ref 0.0–38.6)

## 2022-10-28 MED ORDER — IBRANCE 100 MG PO TABS
ORAL_TABLET | ORAL | 0 refills | Status: DC
  Filled 2022-10-28: qty 21, 28d supply, fill #0
  Filled 2022-10-28: qty 21, fill #0

## 2022-10-28 NOTE — Telephone Encounter (Signed)
Medication scheduled to be shipped on 10/31/22 for delivery on 11/01/22 from [facility] to patient's address. Patient also knows to call me at [phone] with any questions or concerns regarding receiving medication or if there is any unexpected change in co-pay.   Ardeen Fillers, CPhT Oncology Pharmacy Patient Advocate  [facility]  [phone] (phone) [phone] (fax) 10/28/2022 2:42 PM

## 2022-10-28 NOTE — Telephone Encounter (Addendum)
Oral Oncology Patient Advocate Encounter   While waiting for PAP Application to process, funding for Breast Cancer opened. [employer] and cancelled application for PAP.   Called [facility] and cancelled [place] prescription. MD office sent new script to [facility].   Was successful in securing patient a $11,500.00 grant from [employer] (PAF) to provide copayment coverage for Ibrance. This will keep the out of pocket expense at $0.     I have spoken with the patient.    The billing information is as follows and has been shared with [facility].   RxBin: [id number] PCN:  [id number] Member ID: [id number] Group ID: [id number] Dates of Eligibility: 05/01/22 through 10/28/23   Ardeen Fillers, CPhT Oncology Pharmacy Patient Advocate  [facility]  [phone] (phone) [phone] (fax) 10/28/2022 1:37 PM

## 2022-10-28 NOTE — Telephone Encounter (Signed)
Contacted pt to schedule an appt per 10/27/22 LOS. Unable to reach via phone, voicemail was left.   Follow-up disposition: Return in about 4 weeks (around 11/24/2022) for md labs, appt, faslodes and xgeva.

## 2022-11-07 ENCOUNTER — Ambulatory Visit (INDEPENDENT_AMBULATORY_CARE_PROVIDER_SITE_OTHER): Payer: Medicare HMO | Admitting: Family Medicine

## 2022-11-07 ENCOUNTER — Encounter: Payer: Self-pay | Admitting: Family Medicine

## 2022-11-07 VITALS — BP 100/68 | HR 72 | Temp 98.2°F | Resp 20 | Ht 68.0 in | Wt 195.0 lb

## 2022-11-07 DIAGNOSIS — J988 Other specified respiratory disorders: Secondary | ICD-10-CM

## 2022-11-07 DIAGNOSIS — Z1152 Encounter for screening for COVID-19: Secondary | ICD-10-CM

## 2022-11-07 DIAGNOSIS — B9689 Other specified bacterial agents as the cause of diseases classified elsewhere: Secondary | ICD-10-CM | POA: Diagnosis not present

## 2022-11-07 LAB — POC COVID19 BINAXNOW: SARS Coronavirus 2 Ag: NEGATIVE

## 2022-11-07 MED ORDER — DOXYCYCLINE HYCLATE 100 MG PO TABS
100.0000 mg | ORAL_TABLET | Freq: Two times a day (BID) | ORAL | 0 refills | Status: AC
Start: 2022-11-07 — End: 2022-11-14

## 2022-11-07 NOTE — Progress Notes (Signed)
Assessment & Plan:  1. Bacterial respiratory infection Discussed symptom management. - doxycycline (VIBRA-TABS) 100 MG tablet; Take 1 tablet (100 mg total) by mouth 2 (two) times daily for 7 days.  Dispense: 14 tablet; Refill: 0  2. Encounter for screening for COVID-19 - POC COVID-19 BinaxNow - negative   No results found for any visits on 11/07/22.  Follow up plan: Return if symptoms worsen or fail to improve.  Caitlin Boston, MSN, APRN, FNP-C  Subjective:  HPI: Caitlin Greene is a 57 y.o. female presenting on 11/07/2022 for Cough (And congestion - productive with Green mucus /Was sick about a week ago with aches, fever and ST - that part is better, but the cough is still lingering )  Patient complains of cough, chest congestion, and wheezing. She denies shortness of breath. Onset of symptoms was 1 week ago. She is drinking plenty of fluids. Evaluation to date: none. Treatment to date: cough suppressants and Tylenol . She does not smoke.    ROS: Negative unless specifically indicated above in HPI.   Relevant past medical history reviewed and updated as indicated.   Allergies and medications reviewed and updated.   Current Outpatient Medications:    acetaminophen (TYLENOL) 650 MG CR tablet, Take 650 mg by mouth every 8 (eight) hours as needed for pain., Disp: , Rfl:    calcium carbonate (OS-CAL) 600 MG tablet, Take 1 tablet (600 mg total) by mouth 2 (two) times daily., Disp: 30 tablet, Rfl: 0   Ferrous Sulfate (IRON) 325 (65 Fe) MG TABS, Take 1 tablet (325 mg total) by mouth daily at 2 PM., Disp: 30 tablet, Rfl: 0   FLUoxetine (PROZAC) 20 MG capsule, TAKE 3 CAPSULES(60 MG) BY MOUTH DAILY, Disp: 270 capsule, Rfl: 3   fulvestrant (FASLODEX) 250 MG/5ML injection, Inject 500 mg into the muscle every 30 (thirty) days. One injection each buttock over 1-2 minutes. Warm prior to use., Disp: , Rfl:    IBRANCE 100 MG tablet, TAKE 1 TABLET DAILY ON DAYS 1 THROUGH 21 OF CYCLE, 7 DAYS OFF.  REPEAT, Disp: 21 tablet, Rfl: 0   meclizine (ANTIVERT) 12.5 MG tablet, Take 1 tablet (12.5 mg total) by mouth 3 (three) times daily as needed for dizziness., Disp: 40 tablet, Rfl: 1   oxyCODONE (OXY IR/ROXICODONE) 5 MG immediate release tablet, Take 1 tablet (5 mg total) by mouth every 8 (eight) hours as needed for severe pain., Disp: 50 tablet, Rfl: 0   pantoprazole (PROTONIX) 40 MG tablet, TAKE 1 TABLET(40 MG) BY MOUTH TWICE DAILY BEFORE A MEAL, Disp: 60 tablet, Rfl: 5   vitamin B-12 (CYANOCOBALAMIN) 1000 MCG tablet, Take 1 tablet (1,000 mcg total) by mouth daily., Disp: 30 tablet, Rfl: 0   Wheat Dextrin (BENEFIBER DRINK MIX PO), Take by mouth daily at 6 (six) AM., Disp: , Rfl:   Allergies  Allergen Reactions   Amoxicillin-Pot Clavulanate Hives and Itching   Iron Anaphylaxis and Other (See Comments)    Patient develops hypotension, resp distress after infusion of feraheme.    Penicillins Hives, Itching and Other (See Comments)   Codeine Nausea And Vomiting    Needs pre-meds     Objective:   BP 100/68   Pulse 72   Temp 98.2 F (36.8 C)   Resp 20   Ht 5\' 8"  (1.727 m)   Wt 195 lb (88.5 kg)   LMP 06/09/2017 (Exact Date)   SpO2 93% Comment: RA  BMI 29.65 kg/m    Physical Exam Vitals reviewed.  Constitutional:      General: She is not in acute distress.    Appearance: Normal appearance. She is not ill-appearing, toxic-appearing or diaphoretic.  HENT:     Head: Normocephalic and atraumatic.  Eyes:     General: No scleral icterus.       Right eye: No discharge.        Left eye: No discharge.     Conjunctiva/sclera: Conjunctivae normal.  Cardiovascular:     Rate and Rhythm: Normal rate and regular rhythm.     Heart sounds: Normal heart sounds. No murmur heard.    No friction rub. No gallop.  Pulmonary:     Effort: Pulmonary effort is normal. No respiratory distress.     Breath sounds: Normal breath sounds. No stridor. No wheezing, rhonchi or rales.  Musculoskeletal:         General: Normal range of motion.     Cervical back: Normal range of motion.  Skin:    General: Skin is warm and dry.     Capillary Refill: Capillary refill takes less than 2 seconds.  Neurological:     General: No focal deficit present.     Mental Status: She is alert and oriented to person, place, and time. Mental status is at baseline.  Psychiatric:        Mood and Affect: Mood normal.        Behavior: Behavior normal.        Thought Content: Thought content normal.        Judgment: Judgment normal.

## 2022-11-21 ENCOUNTER — Ambulatory Visit (HOSPITAL_COMMUNITY)
Admission: RE | Admit: 2022-11-21 | Discharge: 2022-11-21 | Disposition: A | Discharge status: Home / Self Care | Payer: Medicare HMO | Source: Ambulatory Visit | Attending: Hematology & Oncology | Admitting: Hematology & Oncology

## 2022-11-21 DIAGNOSIS — C50919 Malignant neoplasm of unspecified site of unspecified female breast: Secondary | ICD-10-CM | POA: Diagnosis not present

## 2022-11-21 DIAGNOSIS — C7951 Secondary malignant neoplasm of bone: Secondary | ICD-10-CM | POA: Insufficient documentation

## 2022-11-21 DIAGNOSIS — D3502 Benign neoplasm of left adrenal gland: Secondary | ICD-10-CM | POA: Diagnosis not present

## 2022-11-21 MED ORDER — IOHEXOL 300 MG/ML  SOLN
100.0000 mL | Freq: Once | INTRAMUSCULAR | Status: AC | PRN
  Administered 2022-11-21: 100 mL via INTRAVENOUS

## 2022-11-21 MED ORDER — TECHNETIUM TC 99M MEDRONATE IV KIT
20.0000 | PACK | Freq: Once | INTRAVENOUS | Status: AC | PRN
  Administered 2022-11-21: 20 via INTRAVENOUS

## 2022-11-21 MED ORDER — HEPARIN SOD (PORK) LOCK FLUSH 100 UNIT/ML IV SOLN
500.0000 [IU] | Freq: Once | INTRAVENOUS | Status: AC
  Administered 2022-11-21: 500 [IU] via INTRAVENOUS

## 2022-11-21 MED ORDER — HEPARIN SOD (PORK) LOCK FLUSH 100 UNIT/ML IV SOLN
INTRAVENOUS | Status: AC
  Filled 2022-11-21: qty 5

## 2022-11-24 ENCOUNTER — Other Ambulatory Visit (HOSPITAL_COMMUNITY): Payer: Self-pay

## 2022-11-25 ENCOUNTER — Encounter: Payer: Self-pay | Admitting: Hematology & Oncology

## 2022-11-25 ENCOUNTER — Inpatient Hospital Stay: Payer: Medicare HMO

## 2022-11-25 ENCOUNTER — Inpatient Hospital Stay: Payer: Medicare HMO | Attending: Hematology & Oncology

## 2022-11-25 ENCOUNTER — Other Ambulatory Visit (HOSPITAL_COMMUNITY): Payer: Self-pay

## 2022-11-25 ENCOUNTER — Inpatient Hospital Stay (HOSPITAL_BASED_OUTPATIENT_CLINIC_OR_DEPARTMENT_OTHER): Payer: Medicare HMO | Admitting: Hematology & Oncology

## 2022-11-25 ENCOUNTER — Other Ambulatory Visit: Payer: Self-pay

## 2022-11-25 ENCOUNTER — Other Ambulatory Visit: Payer: Self-pay | Admitting: Hematology & Oncology

## 2022-11-25 VITALS — BP 115/74 | HR 75 | Temp 97.6°F | Resp 18 | Ht 68.0 in | Wt 199.4 lb

## 2022-11-25 DIAGNOSIS — C50919 Malignant neoplasm of unspecified site of unspecified female breast: Secondary | ICD-10-CM | POA: Insufficient documentation

## 2022-11-25 DIAGNOSIS — C7951 Secondary malignant neoplasm of bone: Secondary | ICD-10-CM

## 2022-11-25 DIAGNOSIS — K921 Melena: Secondary | ICD-10-CM | POA: Diagnosis not present

## 2022-11-25 DIAGNOSIS — Z79899 Other long term (current) drug therapy: Secondary | ICD-10-CM | POA: Diagnosis not present

## 2022-11-25 DIAGNOSIS — D510 Vitamin B12 deficiency anemia due to intrinsic factor deficiency: Secondary | ICD-10-CM

## 2022-11-25 DIAGNOSIS — Z5111 Encounter for antineoplastic chemotherapy: Secondary | ICD-10-CM | POA: Diagnosis not present

## 2022-11-25 DIAGNOSIS — Z95828 Presence of other vascular implants and grafts: Secondary | ICD-10-CM

## 2022-11-25 DIAGNOSIS — Z170 Estrogen receptor positive status [ER+]: Secondary | ICD-10-CM | POA: Diagnosis not present

## 2022-11-25 LAB — CMP (CANCER CENTER ONLY)
ALT: 12 U/L (ref 0–44)
AST: 11 U/L — ABNORMAL LOW (ref 15–41)
Albumin: 4 g/dL (ref 3.5–5.0)
Alkaline Phosphatase: 56 U/L (ref 38–126)
Anion gap: 7 (ref 5–15)
BUN: 15 mg/dL (ref 6–20)
CO2: 27 mmol/L (ref 22–32)
Calcium: 9.1 mg/dL (ref 8.9–10.3)
Chloride: 106 mmol/L (ref 98–111)
Creatinine: 0.95 mg/dL (ref 0.44–1.00)
GFR, Estimated: >60 mL/min (ref >60)
Glucose, Bld: 102 mg/dL — ABNORMAL HIGH (ref 70–99)
Potassium: 3.5 mmol/L (ref 3.5–5.1)
Sodium: 140 mmol/L (ref 135–145)
Total Bilirubin: 0.4 mg/dL (ref 0.3–1.2)
Total Protein: 6.3 g/dL — ABNORMAL LOW (ref 6.5–8.1)

## 2022-11-25 LAB — CBC WITH DIFFERENTIAL (CANCER CENTER ONLY)
Abs Immature Granulocytes: 0 10*3/uL (ref 0.00–0.07)
Basophils Absolute: 0 10*3/uL (ref 0.0–0.1)
Basophils Relative: 2 %
Eosinophils Absolute: 0.1 10*3/uL (ref 0.0–0.5)
Eosinophils Relative: 4 %
HCT: 34.8 % — ABNORMAL LOW (ref 36.0–46.0)
Hemoglobin: 11.8 g/dL — ABNORMAL LOW (ref 12.0–15.0)
Immature Granulocytes: 0 %
Lymphocytes Relative: 39 %
Lymphs Abs: 0.8 10*3/uL (ref 0.7–4.0)
MCH: 34 pg (ref 26.0–34.0)
MCHC: 33.9 g/dL (ref 30.0–36.0)
MCV: 100.3 fL — ABNORMAL HIGH (ref 80.0–100.0)
Monocytes Absolute: 0.2 10*3/uL (ref 0.1–1.0)
Monocytes Relative: 11 %
Neutro Abs: 1 10*3/uL — ABNORMAL LOW (ref 1.7–7.7)
Neutrophils Relative %: 44 %
Platelet Count: 175 10*3/uL (ref 150–400)
RBC: 3.47 MIL/uL — ABNORMAL LOW (ref 3.87–5.11)
RDW: 14.9 % (ref 11.5–15.5)
WBC Count: 2.2 10*3/uL — ABNORMAL LOW (ref 4.0–10.5)
nRBC: 0 % (ref 0.0–0.2)

## 2022-11-25 LAB — IRON AND IRON BINDING CAPACITY (CC-WL,HP ONLY)
Iron: 96 ug/dL (ref 28–170)
Saturation Ratios: 35 % — ABNORMAL HIGH (ref 10.4–31.8)
TIBC: 277 ug/dL (ref 250–450)
UIBC: 181 ug/dL (ref 148–442)

## 2022-11-25 LAB — RETICULOCYTES
Immature Retic Fract: 12.4 % (ref 2.3–15.9)
RBC.: 3.44 MIL/uL — ABNORMAL LOW (ref 3.87–5.11)
Retic Count, Absolute: 70.5 10*3/uL (ref 19.0–186.0)
Retic Ct Pct: 2.1 % (ref 0.4–3.1)

## 2022-11-25 LAB — FERRITIN: Ferritin: 126 ng/mL (ref 11–307)

## 2022-11-25 MED ORDER — IBRANCE 100 MG PO TABS
ORAL_TABLET | ORAL | 0 refills | Status: DC
  Filled 2022-11-25: qty 21, 28d supply, fill #0

## 2022-11-25 MED ORDER — LIDOCAINE-PRILOCAINE 2.5-2.5 % EX CREA: 1.0000 | TOPICAL_CREAM | CUTANEOUS | 0 refills | Status: DC | PRN

## 2022-11-25 MED ORDER — FULVESTRANT 250 MG/5ML IM SOSY
500.0000 mg | PREFILLED_SYRINGE | INTRAMUSCULAR | Status: DC
  Administered 2022-11-25: 500 mg via INTRAMUSCULAR

## 2022-11-25 MED ORDER — HEPARIN SOD (PORK) LOCK FLUSH 100 UNIT/ML IV SOLN
500.0000 [IU] | Freq: Once | INTRAVENOUS | Status: AC
  Administered 2022-11-25: 500 [IU] via INTRAVENOUS

## 2022-11-25 MED ORDER — DENOSUMAB 120 MG/1.7ML ~~LOC~~ SOLN
120.0000 mg | Freq: Once | SUBCUTANEOUS | Status: AC
  Administered 2022-11-25: 120 mg via SUBCUTANEOUS

## 2022-11-25 MED ORDER — SODIUM CHLORIDE 0.9% FLUSH
10.0000 mL | Freq: Once | INTRAVENOUS | Status: AC
  Administered 2022-11-25: 10 mL via INTRAVENOUS

## 2022-11-25 NOTE — Progress Notes (Unsigned)
Hematology and Oncology Follow Up Visit  Caitlin Greene [id number] Nov 08, 1965 57 y.o. 11/25/2022   Principle Diagnosis:  Metastatic breast cancer-ER positive/HER-2 negative --bone metastasis only Iron deficiency anemia  Current Therapy:   Faslodex 500 mg IM monthly --start on 04/2020 Ibrance 100 mg p.o. daily (21d on/7d off) - start on 04/2020 Xgeva 120 mg subcu every 3 months -next dose 02/2023 IV iron-Venofer given on 10/21/2022       Interim History:  Caitlin Greene is in for follow-up.  She seems to be doing quite well.  She is still watching of her sister and father.  Her sister, unfortunately, is declining because of the  CJD that she has.  I just feel bad for her.  We did do CT scans on her.  They were done on 11/21/2022.  Thankfully, the CT scan does not show any evidence of progressive disease.  She had a bone scan done also on 11/21/2022.  This has not yet been read.  Her last CA 27.29 was 47.1.  Today, it is stable at 46.  She is not having any problems with pain.  She is on a good pain regimen.  Is been no problems with GI bleeding.  Her iron studies today show a ferritin of 126 with an iron saturation of 35%.  She has had no headache.  She has had a little bit of discomfort in the hip area, more so on the left.  We will have to see what the bone scan shows.  If, for some reason, the bone scan does show some activity, we can certainly give some radiation.  She has had no change in bowel or bladder habits.  She has had no rashes.  There is been no cough or shortness of breath.  Overall, I would say that her performance status is probably ECOG 1.  . Wt Readings from Last 3 Encounters:  11/25/22 199 lb 6.4 oz (90.4 kg)  11/07/22 195 lb (88.5 kg)  10/27/22 201 lb (91.2 kg)    Medications:  Current Outpatient Medications:    acetaminophen (TYLENOL) 650 MG CR tablet, Take 650 mg by mouth every 8 (eight) hours as needed for pain., Disp: , Rfl:    calcium carbonate (OS-CAL)  600 MG tablet, Take 1 tablet (600 mg total) by mouth 2 (two) times daily., Disp: 30 tablet, Rfl: 0   Ferrous Sulfate (IRON) 325 (65 Fe) MG TABS, Take 1 tablet (325 mg total) by mouth daily at 2 PM., Disp: 30 tablet, Rfl: 0   FLUoxetine (PROZAC) 20 MG capsule, TAKE 3 CAPSULES(60 MG) BY MOUTH DAILY, Disp: 270 capsule, Rfl: 3   fulvestrant (FASLODEX) 250 MG/5ML injection, Inject 500 mg into the muscle every 30 (thirty) days. One injection each buttock over 1-2 minutes. Warm prior to use., Disp: , Rfl:    IBRANCE 100 MG tablet, TAKE 1 TABLET DAILY ON DAYS 1 THROUGH 21 OF CYCLE, 7 DAYS OFF. REPEAT, Disp: 21 tablet, Rfl: 0   lidocaine-prilocaine (EMLA) cream, Apply 1 Application topically as needed., Disp: 30 g, Rfl: 0   meclizine (ANTIVERT) 12.5 MG tablet, Take 1 tablet (12.5 mg total) by mouth 3 (three) times daily as needed for dizziness., Disp: 40 tablet, Rfl: 1   oxyCODONE (OXY IR/ROXICODONE) 5 MG immediate release tablet, Take 1 tablet (5 mg total) by mouth every 8 (eight) hours as needed for severe pain., Disp: 50 tablet, Rfl: 0   pantoprazole (PROTONIX) 40 MG tablet, TAKE 1 TABLET(40 MG) BY MOUTH TWICE DAILY BEFORE  A MEAL, Disp: 60 tablet, Rfl: 5   vitamin B-12 (CYANOCOBALAMIN) 1000 MCG tablet, Take 1 tablet (1,000 mcg total) by mouth daily., Disp: 30 tablet, Rfl: 0   Wheat Dextrin (BENEFIBER DRINK MIX PO), Take by mouth daily at 6 (six) AM., Disp: , Rfl:   Allergies:  Allergies  Allergen Reactions   Amoxicillin-Pot Clavulanate Hives and Itching   Iron Anaphylaxis and Other (See Comments)    Patient develops hypotension, resp distress after infusion of feraheme.    Penicillins Hives, Itching and Other (See Comments)   Codeine Nausea And Vomiting    Needs pre-meds     Past Medical History, Surgical history, Social history, and Family History were reviewed and updated.  Review of Systems: Review of Systems  Constitutional:  Negative for appetite change.  HENT:  Negative.    Eyes:  Negative.   Respiratory: Negative.    Cardiovascular: Negative.   Gastrointestinal:  Negative for abdominal pain and nausea.  Endocrine: Negative.   Genitourinary: Negative.    Musculoskeletal:  Positive for arthralgias and back pain.  Skin: Negative.   Neurological:  Negative for dizziness.  Hematological: Negative.   Psychiatric/Behavioral: Negative.      Physical Exam:  height is 5\' 8"  (1.727 m) and weight is 199 lb 6.4 oz (90.4 kg). Her oral temperature is 97.6 F (36.4 C). Her blood pressure is 115/74 and her pulse is 75. Her respiration is 18 and oxygen saturation is 99%.   Wt Readings from Last 3 Encounters:  11/25/22 199 lb 6.4 oz (90.4 kg)  11/07/22 195 lb (88.5 kg)  10/27/22 201 lb (91.2 kg)    Physical Exam Vitals reviewed.  HENT:     Head: Normocephalic and atraumatic.  Eyes:     Pupils: Pupils are equal, round, and reactive to light.  Cardiovascular:     Rate and Rhythm: Normal rate and regular rhythm.     Heart sounds: Normal heart sounds.  Pulmonary:     Effort: Pulmonary effort is normal.     Breath sounds: Normal breath sounds.  Abdominal:     General: Bowel sounds are normal.     Palpations: Abdomen is soft.  Musculoskeletal:        General: No tenderness or deformity. Normal range of motion.     Cervical back: Normal range of motion.  Lymphadenopathy:     Cervical: No cervical adenopathy.  Skin:    General: Skin is warm and dry.     Findings: No erythema or rash.  Neurological:     Mental Status: She is alert and oriented to person, place, and time.  Psychiatric:        Behavior: Behavior normal.        Thought Content: Thought content normal.        Judgment: Judgment normal.     Lab Results  Component Value Date   WBC 2.2 (L) 11/25/2022   HGB 11.8 (L) 11/25/2022   HCT 34.8 (L) 11/25/2022   MCV 100.3 (H) 11/25/2022   PLT 175 11/25/2022     Chemistry      Component Value Date/Time   NA 140 11/25/2022 0930   K 3.5 11/25/2022 0930    CL 106 11/25/2022 0930   CO2 27 11/25/2022 0930   BUN 15 11/25/2022 0930   CREATININE 0.95 11/25/2022 0930   CREATININE 0.88 10/22/2019 1246      Component Value Date/Time   CALCIUM 9.1 11/25/2022 0930   ALKPHOS 56 11/25/2022 0930   AST  11 (L) 11/25/2022 0930   ALT 12 11/25/2022 0930   BILITOT 0.4 11/25/2022 0930       Impression and Plan: Ms. Peaster is a very charming 57 year old postmenopausal female with metastatic breast cancer.  We actually had seen her many years ago with a carcinoma in situ.  I am glad that she is doing okay.  I know that she wants to be able to take care of her sister and father.  Today, she will get Faslodex and Xgeva.  So far, we have done incredibly well with antiestrogen therapy.  We have had her on this now for close to 2 years.  Again we will have to await the results of her bone scan.  If all looks good, we will then plan to get her back in another month.Marland Kitchen

## 2022-11-25 NOTE — Addendum Note (Signed)
Addended by: Charlette Caffey on: 11/25/2022 09:33 AM   Modules accepted: Orders

## 2022-11-25 NOTE — Patient Instructions (Signed)
Fulvestrant Injection What is this medication? FULVESTRANT (ful VES trant) treats breast cancer. It works by blocking the hormone estrogen in breast tissue, which prevents breast cancer cells from spreading or growing. This medicine may be used for other purposes; ask your health care provider or pharmacist if you have questions. COMMON BRAND NAME(S): FASLODEX What should I tell my care team before I take this medication? They need to know if you have any of these conditions: Bleeding disorder Liver disease Low blood cell levels, such as low white cells, red cells, and platelets An unusual or allergic reaction to fulvestrant, other medications, foods, dyes, or preservatives Pregnant or trying to get pregnant Breast-feeding How should I use this medication? This medication is injected into a muscle. It is given by your care team in a hospital or clinic setting. Talk to your care team about the use of this medication in children. Special care may be needed. Overdosage: If you think you have taken too much of this medicine contact a poison control center or emergency room at once. NOTE: This medicine is only for you. Do not share this medicine with others. What if I miss a dose? Keep appointments for follow-up doses. It is important not to miss your dose. Call your care team if you are unable to keep an appointment. What may interact with this medication? Certain medications that prevent or treat blood clots, such as warfarin, enoxaparin, dalteparin, apixaban, dabigatran, rivaroxaban This list may not describe all possible interactions. Give your health care provider a list of all the medicines, herbs, non-prescription drugs, or dietary supplements you use. Also tell them if you smoke, drink alcohol, or use illegal drugs. Some items may interact with your medicine. What should I watch for while using this medication? Your condition will be monitored carefully while you are receiving this  medication. You may need blood work while taking this medication. Talk to your care team if you may be pregnant. Serious birth defects can occur if you take this medication during pregnancy and for 1 year after the last dose. You will need a negative pregnancy test before starting this medication. Contraception is recommended while taking this medication and for 1 year after the last dose. Your care team can help you find the option that works for you. Do not breastfeed while taking this medication and for 1 year after the last dose. This medication may cause infertility. Talk to your care team if you are concerned about your fertility. What side effects may I notice from receiving this medication? Side effects that you should report to your care team as soon as possible: Allergic reactions or angioedema--skin rash, itching or hives, swelling of the face, eyes, lips, tongue, arms, or legs, trouble swallowing or breathing Pain, tingling, or numbness in the hands or feet Side effects that usually do not require medical attention (report to your care team if they continue or are bothersome): Bone, joint, or muscle pain Constipation Headache Hot flashes Nausea Pain, redness, or irritation at injection site Unusual weakness or fatigue This list may not describe all possible side effects. Call your doctor for medical advice about side effects. You may report side effects to FDA at 1-800-FDA-1088. Where should I keep my medication? This medication is given in a hospital or clinic. It will not be stored at home. NOTE: This sheet is a summary. It may not cover all possible information. If you have questions about this medicine, talk to your doctor, pharmacist, or health care provider.    2023 Elsevier/Gold Standard (2007-06-16 00:00:00) Denosumab Injection (Oncology) What is this medication? DENOSUMAB (den oh SUE mab) prevents weakened bones caused by cancer. It may also be used to treat noncancerous  bone tumors that cannot be removed by surgery. It can also be used to treat high calcium levels in the blood caused by cancer. It works by blocking a protein that causes bones to break down quickly. This slows down the release of calcium from bones, which lowers calcium levels in your blood. It also makes your bones stronger and less likely to break (fracture). This medicine may be used for other purposes; ask your health care provider or pharmacist if you have questions. COMMON BRAND NAME(S): XGEVA What should I tell my care team before I take this medication? They need to know if you have any of these conditions: Dental disease Having surgery or tooth extraction Infection Kidney disease Low levels of calcium or vitamin D in the blood Malnutrition On hemodialysis Skin conditions or sensitivity Thyroid or parathyroid disease An unusual reaction to denosumab, other medications, foods, dyes, or preservatives Pregnant or trying to get pregnant Breast-feeding How should I use this medication? This medication is for injection under the skin. It is given by your care team in a hospital or clinic setting. A special MedGuide will be given to you before each treatment. Be sure to read this information carefully each time. Talk to your care team about the use of this medication in children. While it may be prescribed for children as young as 13 years for selected conditions, precautions do apply. Overdosage: If you think you have taken too much of this medicine contact a poison control center or emergency room at once. NOTE: This medicine is only for you. Do not share this medicine with others. What if I miss a dose? Keep appointments for follow-up doses. It is important not to miss your dose. Call your care team if you are unable to keep an appointment. What may interact with this medication? Do not take this medication with any of the following: Other medications containing denosumab This  medication may also interact with the following: Medications that lower your chance of fighting infection Steroid medications, such as prednisone or cortisone This list may not describe all possible interactions. Give your health care provider a list of all the medicines, herbs, non-prescription drugs, or dietary supplements you use. Also tell them if you smoke, drink alcohol, or use illegal drugs. Some items may interact with your medicine. What should I watch for while using this medication? Your condition will be monitored carefully while you are receiving this medication. You may need blood work while taking this medication. This medication may increase your risk of getting an infection. Call your care team for advice if you get a fever, chills, sore throat, or other symptoms of a cold or flu. Do not treat yourself. Try to avoid being around people who are sick. You should make sure you get enough calcium and vitamin D while you are taking this medication, unless your care team tells you not to. Discuss the foods you eat and the vitamins you take with your care team. Some people who take this medication have severe bone, joint, or muscle pain. This medication may also increase your risk for jaw problems or a broken thigh bone. Tell your care team right away if you have severe pain in your jaw, bones, joints, or muscles. Tell your care team if you have any pain that does not go   away or that gets worse. Talk to your care team if you may be pregnant. Serious birth defects can occur if you take this medication during pregnancy and for 5 months after the last dose. You will need a negative pregnancy test before starting this medication. Contraception is recommended while taking this medication and for 5 months after the last dose. Your care team can help you find the option that works for you. What side effects may I notice from receiving this medication? Side effects that you should report to your care  team as soon as possible: Allergic reactions--skin rash, itching, hives, swelling of the face, lips, tongue, or throat Bone, joint, or muscle pain Low calcium level--muscle pain or cramps, confusion, tingling, or numbness in the hands or feet Osteonecrosis of the jaw--pain, swelling, or redness in the mouth, numbness of the jaw, poor healing after dental work, unusual discharge from the mouth, visible bones in the mouth Side effects that usually do not require medical attention (report to your care team if they continue or are bothersome): Cough Diarrhea Fatigue Headache Nausea This list may not describe all possible side effects. Call your doctor for medical advice about side effects. You may report side effects to FDA at 1-800-FDA-1088. Where should I keep my medication? This medication is given in a hospital or clinic. It will not be stored at home. NOTE: This sheet is a summary. It may not cover all possible information. If you have questions about this medicine, talk to your doctor, pharmacist, or health care provider.  2023 Elsevier/Gold Standard (2021-09-13 00:00:00)  

## 2022-11-25 NOTE — Patient Instructions (Signed)

## 2022-11-26 ENCOUNTER — Encounter: Payer: Self-pay | Admitting: Hematology & Oncology

## 2022-11-26 LAB — CANCER ANTIGEN 27.29: CA 27.29: 46.1 U/mL — ABNORMAL HIGH (ref 0.0–38.6)

## 2022-11-28 ENCOUNTER — Other Ambulatory Visit: Payer: Self-pay

## 2022-11-28 ENCOUNTER — Other Ambulatory Visit (HOSPITAL_COMMUNITY): Payer: Self-pay

## 2022-11-29 ENCOUNTER — Telehealth: Payer: Self-pay

## 2022-11-29 ENCOUNTER — Encounter: Payer: Self-pay | Admitting: Radiation Oncology

## 2022-11-29 NOTE — Telephone Encounter (Signed)
Rn called pt for meaningful use and nurse evaluation information. Note completed and routed to Dr. Roselind Messier for review.

## 2022-11-29 NOTE — Progress Notes (Addendum)
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 New punctate increased radiotracer uptake of the LEFT iliac bone. This is nonspecific. Recommend attention on follow-up. 2. Mildly increased uptake of the lateral approximate T7 vertebral body. This may be degenerative in etiology. Recommend attention on follow-up. 3. Otherwise similar appearance of osseous metastatic disease.  Past/Anticipated chemotherapy by medical oncology, if any:  Josph Macho, MD 10/27/2022  Interim History:  [name] is in for follow-up.  She seems to be doing quite well.  She is still watching of her sister and father.  Her sister, unfortunately, is declined because of the  CJD that she has.  I just feel bad for her.   Impression and Plan: Caitlin Greene is a very charming 58 year old postmenopausal female with metastatic breast cancer.  We actually had seen her many years ago with a carcinoma in situ.   I am glad that she is doing okay.  I know that she wants to be able to take care of her sister and father.   Today, she will get Faslodex.  Because she now is on Medicare, she cannot get the [place]  This has to be approved.  We probably will give this when we see her back.   We will set up a CT scan and a bone scan for her.  Will do this in about 2-3 months.   We will get her back in 1 more month.  Patient's main complaints related to symptomatic tumor(s) are:  Positive for arthralgias and back pain.   Pain on a scale of 0-10 is: 4-5/10 to mid back and left hip, it does improve with Tylenol  If Spine Met(s), symptoms, if any, include: Bowel/Bladder retention or incontinence (please describe): none  Numbness or weakness in extremities (please describe): none Current Decadron regimen, if applicable: none  Ambulatory status? Walker?  Wheelchair?: ambulates well  SAFETY ISSUES: Prior radiation? None in the past,  Pacemaker/ICD? none Possible current pregnancy? no Is the patient on methotrexate? no  Additional Complaints / other details:  Pt is doing well overall though this diagnosis timing is rough with her her sister now being under hospice care with CJD. Her sister has been a big supporter through her cancer journey. Pt overall wants to know if radiation is necessary and would also like to see images of her bone scan if possible.

## 2022-11-29 NOTE — Progress Notes (Signed)
Radiation Oncology         [phone] ________________________________  Follow-Up New Visit   Outpatient   Name: Caitlin Greene MRN: [id number]  Date: 11/30/2022  DOB: [birth year]  DG:UYQI, Len Blalock, MD  Josph Macho, MD   REFERRING PHYSICIAN: Josph Macho, MD  DIAGNOSIS: {There were no encounter diagnoses. (Refresh or delete this SmartLink)}  History of ductal carcinoma in situ diagnosed 30 years ago which was treated with right mastectomy and lymph node dissection at [facility].    Bony metastatic adenocarcinoma of multiple sites (from breast cancer primary) diagnosed in 2021; s/p chemotherapy and currently on antiestrogens. Recent evidence of several new possible sites of osseous metastatic disease in the left iliac bone and T7 (July 2024)  HPI / Interval History - 11/30/22 ::Caitlin Greene is a 57 y.o. female who is accompanied by ***. she is seen as a courtesy of Dr. Myna Hidalgo for an opinion concerning radiation therapy as part of management for several new possible sites of osseous metastatic disease in the left iliac bone and T7.  The patient was last seen here for re-evaluation on 06/17/21 . At which time, we reviewed imaging which showed uptake in the mid back along the lower thoracic spine. We discussed palliative radiation therapy to the 3 areas in the right pelvis and left pelvis as well as the lower thoracic spine. However, her most recent bone scan and MRI of the pelvis at that time reflected a positive response to Ibrance and Faslodex. She was also relatively asymptomatic at that time. Based on the lack of significant activity on recent imaging, and her minimal symptomatology, I recommended holding off on radiation therapy at that time.   Since she was last seen here, the patient has continued to follow with Dr. Myna Hidalgo and has continued to receive Ibrance and Faslodex along with Xgeva q3 months. Until very recent history (noted below), she has remained without evidence of  disease progression on routine imaging. She has also continued to be asymptomatic overall.   She recently presented for a routine bone scan on 11/21/22 which showed nonspecific new punctate increased radiotracer uptake of the left iliac bone, and mild increased uptake of the lateral approximate T7 vertebral body which may be degenerative in etiology. Imaging otherwise stable appearing osseous metastatic disease.   Her most recent CT CAP on 11/21/22 otherwise shows stable multifocal osseous metastatic disease and no evidence of soft tissue metastasis within the chest, abdomen, or pelvis. (Imaging also shows stability of the tiny bilateral pulmonary nodules and stability of the left adrenal nodule favoring an adenoma).   ***  FUN Visit - 06/17/21: The patient returns today to review MRI imaging. To review from her initial consultation on 05/31/21, we discussed how the cause of her bilateral hip pain was not explained on any recent imaging studies. Therefore, I suggested an MRI of the pelvis to better evaluate the extent of her metastatic disease.    Pelvic MRI on 06/10/21 demonstrated multiple mixed lytic and sclerotic lesions with mild heterogeneous enhancement in the pelvis, and partially visualized lumbar spine consistent with osseous metastases. Sites seen on MRI imaging include (but are not limited to) the right iliac wing, posterior right iliac bone, and left ischial tuberosity.  Lesions overall appeared similar in size in distribution in comparison to prior CT in December 2022. Intralesional fat was also appreciated in several of these lesions, which may reflect post treatment change. Lesions in the anterior right iliac bone and ischial tuberosity were also  seen to demonstrate mildly reduced diffusion. (No lesions were appreciated in the proximal femurs).    In the interval since she was last seen, the patient also met with Dr. Myna Hidalgo on 06/14/21. During which time, Dr. Myna Hidalgo discussed continuing  her on Ibrance and Faslodex. The patient denied any cough, SOB, nausea, vomiting, leg swelling, or headache.  Dr. Myna Hidalgo also reviewed her recent MRI and noted that findings were again hard to interpret in terms of active disease. She will return to Dr. Myna Hidalgo in one month for routine follow-up.   PREVIOUS RADIATION THERAPY: No  PAST MEDICAL HISTORY:  Past Medical History:  Diagnosis Date   ALLERGIC RHINITIS    ANEMIA-IRON DEFICIENCY    ANXIETY    BREAST CANCER, HX OF 01/21/2007   at 57yo   DEPRESSION    GERD    HYPERLIPIDEMIA    Metastatic cancer to bone (HCC) dx'd 10/24/2019   recurrent breast ca   Pneumonia 02/2022   RENAL CALCULUS    rt breast ca dx'd 1988    PAST SURGICAL HISTORY: Past Surgical History:  Procedure Laterality Date   BIOPSY  03/21/2020   Procedure: BIOPSY;  Surgeon: Tressia Danas, MD;  Location: [facility];  Service: Gastroenterology;;   BIOPSY  09/13/2021   Procedure: BIOPSY;  Surgeon: Rachael Fee, MD;  Location: [facility];  Service: Gastroenterology;;   BIOPSY  10/19/2021   Procedure: BIOPSY;  Surgeon: Tressia Danas, MD;  Location: [facility];  Service: Gastroenterology;;   BREAST ENHANCEMENT SURGERY     COLONOSCOPY WITH PROPOFOL N/A 10/19/2021   Procedure: COLONOSCOPY WITH PROPOFOL;  Surgeon: Tressia Danas, MD;  Location: [facility];  Service: Gastroenterology;  Laterality: N/A;   ENTEROSCOPY N/A 10/19/2021   Procedure: ENTEROSCOPY;  Surgeon: Tressia Danas, MD;  Location: [facility];  Service: Gastroenterology;  Laterality: N/A;   ESOPHAGOGASTRODUODENOSCOPY N/A 10/16/2021   Procedure: ESOPHAGOGASTRODUODENOSCOPY (EGD);  Surgeon: Jenel Lucks, MD;  Location: [facility];  Service: Gastroenterology;  Laterality: N/A;   ESOPHAGOGASTRODUODENOSCOPY (EGD) WITH PROPOFOL N/A 03/21/2020   Procedure: ESOPHAGOGASTRODUODENOSCOPY (EGD) WITH PROPOFOL;  Surgeon: Tressia Danas, MD;  Location: [facility];  Service: Gastroenterology;   Laterality: N/A;   ESOPHAGOGASTRODUODENOSCOPY (EGD) WITH PROPOFOL N/A 09/13/2021   Procedure: ESOPHAGOGASTRODUODENOSCOPY (EGD) WITH PROPOFOL;  Surgeon: Rachael Fee, MD;  Location: [facility];  Service: Gastroenterology;  Laterality: N/A;   IR IMAGING GUIDED PORT INSERTION  10/04/2022   MASTECTOMY     right   POLYPECTOMY  10/19/2021   Procedure: POLYPECTOMY;  Surgeon: Tressia Danas, MD;  Location: [facility];  Service: Gastroenterology;;   TEMPOROMANDIBULAR JOINT SURGERY     Left   TONSILLECTOMY      FAMILY HISTORY:  Family History  Problem Relation Age of Onset   Colon polyps Mother    Cancer Mother        Uterine Cancer   Hypertension Mother    Dementia Mother    Colon polyps Father    COPD Father        smoked   Hypertension Other    Diabetes Other    Asthma Sister     SOCIAL HISTORY:  Social History   Tobacco Use   Smoking status: Never   Smokeless tobacco: Never  Vaping Use   Vaping status: Never Used  Substance Use Topics   Alcohol use: No    Alcohol/week: 0.0 standard drinks of alcohol   Drug use: No    ALLERGIES:  Allergies  Allergen Reactions   Amoxicillin-Pot Clavulanate Hives and Itching  Iron Anaphylaxis and Other (See Comments)    Patient develops hypotension, resp distress after infusion of feraheme.    Penicillins Hives, Itching and Other (See Comments)   Codeine Nausea And Vomiting    Needs pre-meds     MEDICATIONS:  Current Outpatient Medications  Medication Sig Dispense Refill   acetaminophen (TYLENOL) 650 MG CR tablet Take 650 mg by mouth every 8 (eight) hours as needed for pain.     calcium carbonate (OS-CAL) 600 MG tablet Take 1 tablet (600 mg total) by mouth 2 (two) times daily. 30 tablet 0   Ferrous Sulfate (IRON) 325 (65 Fe) MG TABS Take 1 tablet (325 mg total) by mouth daily at 2 PM. 30 tablet 0   FLUoxetine (PROZAC) 20 MG capsule TAKE 3 CAPSULES(60 MG) BY MOUTH DAILY 270 capsule 3   fulvestrant (FASLODEX) 250 MG/5ML  injection Inject 500 mg into the muscle every 30 (thirty) days. One injection each buttock over 1-2 minutes. Warm prior to use.     IBRANCE 100 MG tablet TAKE 1 TABLET DAILY ON DAYS 1 THROUGH 21 OF CYCLE, 7 DAYS OFF. REPEAT 21 tablet 0   lidocaine-prilocaine (EMLA) cream Apply 1 Application topically as needed. 30 g 0   meclizine (ANTIVERT) 12.5 MG tablet Take 1 tablet (12.5 mg total) by mouth 3 (three) times daily as needed for dizziness. 40 tablet 1   oxyCODONE (OXY IR/ROXICODONE) 5 MG immediate release tablet Take 1 tablet (5 mg total) by mouth every 8 (eight) hours as needed for severe pain. 50 tablet 0   pantoprazole (PROTONIX) 40 MG tablet TAKE 1 TABLET(40 MG) BY MOUTH TWICE DAILY BEFORE A MEAL 60 tablet 5   vitamin B-12 (CYANOCOBALAMIN) 1000 MCG tablet Take 1 tablet (1,000 mcg total) by mouth daily. 30 tablet 0   Wheat Dextrin (BENEFIBER DRINK MIX PO) Take by mouth daily at 6 (six) AM.     No current facility-administered medications for this encounter.    REVIEW OF SYSTEMS:  A 10+ POINT REVIEW OF SYSTEMS WAS OBTAINED including neurology, dermatology, psychiatry, cardiac, respiratory, lymph, extremities, GI, GU, musculoskeletal, constitutional, reproductive, HEENT. ***   PHYSICAL EXAM:  vitals were not taken for this visit.   General: Alert and oriented, in no acute distress HEENT: Head is normocephalic. Extraocular movements are intact. Oropharynx is clear. Neck: Neck is supple, no palpable cervical or supraclavicular lymphadenopathy. Heart: Regular in rate and rhythm with no murmurs, rubs, or gallops. Chest: Clear to auscultation bilaterally, with no rhonchi, wheezes, or rales. Abdomen: Soft, nontender, nondistended, with no rigidity or guarding. Extremities: No cyanosis or edema. Lymphatics: see Neck Exam Skin: No concerning lesions. Musculoskeletal: symmetric strength and muscle tone throughout. Neurologic: Cranial nerves II through XII are grossly intact. No obvious focalities.  Speech is fluent. Coordination is intact. Psychiatric: Judgment and insight are intact. Affect is appropriate. ***  ECOG = ***  0 - Asymptomatic (Fully active, able to carry on all predisease activities without restriction)  1 - Symptomatic but completely ambulatory (Restricted in physically strenuous activity but ambulatory and able to carry out work of a light or sedentary nature. For example, light housework, office work)  2 - Symptomatic, <50% in bed during the day (Ambulatory and capable of all self care but unable to carry out any work activities. Up and about more than 50% of waking hours)  3 - Symptomatic, >50% in bed, but not bedbound (Capable of only limited self-care, confined to bed or chair 50% or more of waking hours)  4 - Bedbound (Completely disabled. Cannot carry on any self-care. Totally confined to bed or chair)  5 - Death   Santiago Glad MM, Creech RH, Tormey DC, et al. [phone]). "Toxicity and response criteria of the [facility]". Am. Evlyn Clines. Oncol. 5 (6): 649-55  LABORATORY DATA:  Lab Results  Component Value Date   WBC 2.2 (L) 11/25/2022   HGB 11.8 (L) 11/25/2022   HCT 34.8 (L) 11/25/2022   MCV 100.3 (H) 11/25/2022   PLT 175 11/25/2022   NEUTROABS 1.0 (L) 11/25/2022   Lab Results  Component Value Date   NA 140 11/25/2022   K 3.5 11/25/2022   CL 106 11/25/2022   CO2 27 11/25/2022   GLUCOSE 102 (H) 11/25/2022   BUN 15 11/25/2022   CREATININE 0.95 11/25/2022   CALCIUM 9.1 11/25/2022      RADIOGRAPHY: NM Bone Scan Whole Body  Result Date: 11/27/2022 CLINICAL DATA:  Breast cancer, invasive, stage IV, assess treatment response EXAM: NUCLEAR MEDICINE WHOLE BODY BONE SCAN TECHNIQUE: Whole body anterior and posterior images were obtained approximately 3 hours after intravenous injection of radiopharmaceutical. RADIOPHARMACEUTICALS:  20.8 mCi Technetium-22m MDP IV COMPARISON:  July 19, 2022, November 25, 2021. FINDINGS: Revisualization of increased  radiotracer uptake in the thoracolumbar spine, consistent with known osseous metastatic disease. There is mildly increased focal radiotracer uptake of the LEFT approximate T7 lateral vertebral body. Otherwise distribution is similar in comparison to prior. Mildly asymmetric RIGHT iliac uptake near the sacroiliac joint, consistent with known metastatic disease. This is similar in comparison to priors. However there is new punctate increased radiotracer uptake in the LEFT iliac bone, nonspecific. There is radiotracer uptake within the sternoclavicular joints, feet, knees, elbows and bilateral shoulders. This is favored to be degenerative in etiology. Expected physiologic distribution of radiotracer. IMPRESSION: 1. New punctate increased radiotracer uptake of the LEFT iliac bone. This is nonspecific. Recommend attention on follow-up. 2. Mildly increased uptake of the lateral approximate T7 vertebral body. This may be degenerative in etiology. Recommend attention on follow-up. 3. Otherwise similar appearance of osseous metastatic disease. Electronically Signed   By: Meda Klinefelter M.D.   On: 11/27/2022 12:00   CT CHEST ABDOMEN PELVIS W CONTRAST  Result Date: 11/21/2022 CLINICAL DATA:  Stage IV breast cancer. Evaluate treatment response. Known bone metastasis. * Tracking Code: BO * EXAM: CT CHEST, ABDOMEN, AND PELVIS WITH CONTRAST TECHNIQUE: Multidetector CT imaging of the chest, abdomen and pelvis was performed following the standard protocol during bolus administration of intravenous contrast. RADIATION DOSE REDUCTION: This exam was performed according to the departmental dose-optimization program which includes automated exposure control, adjustment of the mA and/or kV according to patient size and/or use of iterative reconstruction technique. CONTRAST:  OMNIPAQUE IOHEXOL 300 MG/ML  SOLN COMPARISON:  07/19/2022 FINDINGS: CT CHEST FINDINGS Cardiovascular: Left Port-A-Cath tip low right atrium. Tortuous  thoracic aorta. Mild cardiomegaly, without pericardial effusion. Aortic atherosclerosis. No central pulmonary embolism, on this non-dedicated study. Mediastinum/Nodes: No supraclavicular adenopathy. Right axillary node dissection. No axillary or subpectoral adenopathy. No mediastinal or hilar adenopathy. Similar moderate to large hiatal hernia, with nearly 1/2 of the stomach positioned in the lower chest. No internal mammary adenopathy. Lungs/Pleura: Medial left lower lobe volume loss adjacent to the hiatal hernia. Millimetric right upper and right middle lobe pulmonary nodules are similar, favoring a benign etiology. Musculoskeletal: Bilateral breast implants. Right intra and probable extracapsular rupture, similar. Similar diffuse sclerotic osseous metastasis. Example within the right side of the T12 vertebral body at  2.7 cm on 59/2. T10 vertebral augmentation secondary to a severe compression deformity with ventral canal encroachment. T6 vertebral augmentation secondary to a mild compression deformity. CT ABDOMEN PELVIS FINDINGS Hepatobiliary: Nonspecific caudate lobe enlargement. No suspicious liver lesion. Dependent gallstones without acute cholecystitis or biliary duct dilatation. Pancreas: Normal, without mass or ductal dilatation. Spleen: Normal in size, without focal abnormality. Adrenals/Urinary Tract: Normal right adrenal gland and bilateral kidneys. Ongoing stability of left adrenal 1.4 cm nodule on 59/2 which measures 53 HU. Normal urinary bladder. Stomach/Bowel: Normal distal stomach. Periampullary duodenal diverticulum or diverticula. Otherwise normal small bowel. Extensive colonic diverticulosis. Vascular/Lymphatic: Aortic atherosclerosis. No abdominopelvic adenopathy. Reproductive: Normal uterus and adnexa. Other: No significant free fluid. No evidence of omental or peritoneal disease. Small fat containing left inguinal hernia. Musculoskeletal: Multifocal sclerotic osseous lesions are again  identified. Example within the anterior L4 vertebral body at 2.0 cm on sagittal image 93. Vertebral augmentation at L2. IMPRESSION: 1. Similar osseous metastasis. 2. No evidence of soft tissue metastasis within the chest, abdomen, or pelvis. 3. Tiny pulmonary nodules, stable and again likely benign. 4. Left adrenal nodule is unchanged including back to 2022, favoring a adenoma. 5. Moderate to large hiatal hernia 6. Cholelithiasis 7.  Aortic Atherosclerosis (ICD10-I70.0). Electronically Signed   By: Jeronimo Greaves M.D.   On: 11/21/2022 12:26      IMPRESSION: Bony metastatic adenocarcinoma of multiple sites (from breast cancer primary) diagnosed in 2021; s/p chemotherapy and currently on antiestrogens. Recent evidence of several new possible sites of osseous metastatic disease in the left iliac bone and T7 (July 2024)  ***  Today, I talked to the patient and family about the findings and work-up thus far.  We discussed the natural history of *** and general treatment, highlighting the role of radiotherapy in the management.  We discussed the available radiation techniques, and focused on the details of logistics and delivery.  We reviewed the anticipated acute and late sequelae associated with radiation in this setting.  The patient was encouraged to ask questions that I answered to the best of my ability. *** A patient consent form was discussed and signed.  We retained a copy for our records.  The patient would like to proceed with radiation and will be scheduled for CT simulation.  PLAN: ***    *** minutes of total time was spent for this patient encounter, including preparation, face-to-face counseling with the patient and coordination of care, physical exam, and documentation of the encounter.   ------------------------------------------------  Billie Lade, PhD, MD  This document serves as a record of services personally performed by Antony Blackbird, MD. It was created on his behalf by Neena Rhymes, a trained medical scribe. The creation of this record is based on the scribe's personal observations and the provider's statements to them. This document has been checked and approved by the attending provider.

## 2022-11-30 ENCOUNTER — Ambulatory Visit
Admission: RE | Admit: 2022-11-30 | Discharge: 2022-11-30 | Disposition: A | Discharge status: Home / Self Care | Payer: Medicare HMO | Source: Ambulatory Visit | Attending: Radiation Oncology | Admitting: Radiation Oncology

## 2022-11-30 ENCOUNTER — Other Ambulatory Visit: Payer: Self-pay

## 2022-11-30 VITALS — BP 117/89 | HR 100 | Temp 97.1°F | Resp 18 | Ht 68.0 in | Wt 199.0 lb

## 2022-11-30 DIAGNOSIS — E785 Hyperlipidemia, unspecified: Secondary | ICD-10-CM | POA: Diagnosis not present

## 2022-11-30 DIAGNOSIS — K5730 Diverticulosis of large intestine without perforation or abscess without bleeding: Secondary | ICD-10-CM | POA: Insufficient documentation

## 2022-11-30 DIAGNOSIS — I517 Cardiomegaly: Secondary | ICD-10-CM | POA: Insufficient documentation

## 2022-11-30 DIAGNOSIS — D509 Iron deficiency anemia, unspecified: Secondary | ICD-10-CM | POA: Diagnosis not present

## 2022-11-30 DIAGNOSIS — Z79899 Other long term (current) drug therapy: Secondary | ICD-10-CM | POA: Diagnosis not present

## 2022-11-30 DIAGNOSIS — Z9221 Personal history of antineoplastic chemotherapy: Secondary | ICD-10-CM | POA: Diagnosis not present

## 2022-11-30 DIAGNOSIS — K219 Gastro-esophageal reflux disease without esophagitis: Secondary | ICD-10-CM | POA: Diagnosis not present

## 2022-11-30 DIAGNOSIS — F411 Generalized anxiety disorder: Secondary | ICD-10-CM

## 2022-11-30 DIAGNOSIS — C7951 Secondary malignant neoplasm of bone: Secondary | ICD-10-CM | POA: Diagnosis not present

## 2022-11-30 DIAGNOSIS — I700 Atherosclerosis of aorta: Secondary | ICD-10-CM | POA: Insufficient documentation

## 2022-11-30 DIAGNOSIS — K4090 Unilateral inguinal hernia, without obstruction or gangrene, not specified as recurrent: Secondary | ICD-10-CM | POA: Diagnosis not present

## 2022-11-30 DIAGNOSIS — K8020 Calculus of gallbladder without cholecystitis without obstruction: Secondary | ICD-10-CM | POA: Insufficient documentation

## 2022-11-30 DIAGNOSIS — Z170 Estrogen receptor positive status [ER+]: Secondary | ICD-10-CM | POA: Insufficient documentation

## 2022-11-30 DIAGNOSIS — C50911 Malignant neoplasm of unspecified site of right female breast: Secondary | ICD-10-CM | POA: Insufficient documentation

## 2022-11-30 DIAGNOSIS — K449 Diaphragmatic hernia without obstruction or gangrene: Secondary | ICD-10-CM | POA: Insufficient documentation

## 2022-11-30 MED ORDER — LORAZEPAM 1 MG PO TABS
1.0000 mg | ORAL_TABLET | Freq: Once | ORAL | 0 refills | Status: AC | PRN
Start: 2022-11-30 — End: ?

## 2022-12-13 ENCOUNTER — Other Ambulatory Visit: Payer: Self-pay | Admitting: Radiology

## 2022-12-13 ENCOUNTER — Telehealth: Payer: Self-pay | Admitting: *Deleted

## 2022-12-13 DIAGNOSIS — C7951 Secondary malignant neoplasm of bone: Secondary | ICD-10-CM

## 2022-12-13 NOTE — Telephone Encounter (Signed)
CALLED PATIENT TO INFORM OF MRI FOR 12-16-22- ARRIVAL TIME- 7 PM @ [facility], NO RESTRICTIONS TO SCAN, PATIENT TO CHECK IN @ ED, I ALSO INFORMED HER TO PICK-UP SCRIPT AT HER PHARMACY TO TAKE BEFORE SCAN, LVM FOR A RETURN CALL

## 2022-12-16 ENCOUNTER — Ambulatory Visit (HOSPITAL_COMMUNITY): Payer: Medicare HMO

## 2022-12-21 ENCOUNTER — Ambulatory Visit (HOSPITAL_COMMUNITY): Payer: Medicare HMO

## 2022-12-22 ENCOUNTER — Ambulatory Visit (HOSPITAL_COMMUNITY)
Admission: RE | Admit: 2022-12-22 | Discharge: 2022-12-22 | Disposition: A | Discharge status: Home / Self Care | Payer: Medicare HMO | Source: Ambulatory Visit | Attending: Radiology | Admitting: Radiology

## 2022-12-22 ENCOUNTER — Other Ambulatory Visit (HOSPITAL_COMMUNITY): Payer: Self-pay

## 2022-12-22 ENCOUNTER — Encounter (INDEPENDENT_AMBULATORY_CARE_PROVIDER_SITE_OTHER): Payer: Self-pay

## 2022-12-22 DIAGNOSIS — C50919 Malignant neoplasm of unspecified site of unspecified female breast: Secondary | ICD-10-CM | POA: Diagnosis not present

## 2022-12-22 DIAGNOSIS — C7951 Secondary malignant neoplasm of bone: Secondary | ICD-10-CM | POA: Insufficient documentation

## 2022-12-22 DIAGNOSIS — K5730 Diverticulosis of large intestine without perforation or abscess without bleeding: Secondary | ICD-10-CM | POA: Diagnosis not present

## 2022-12-22 MED ORDER — HEPARIN SOD (PORK) LOCK FLUSH 100 UNIT/ML IV SOLN
500.0000 [IU] | Freq: Once | INTRAVENOUS | Status: AC
  Administered 2022-12-22: 500 [IU] via INTRAVENOUS

## 2022-12-22 MED ORDER — GADOBUTROL 1 MMOL/ML IV SOLN
9.0000 mL | Freq: Once | INTRAVENOUS | Status: AC | PRN
  Administered 2022-12-22: 9 mL via INTRAVENOUS

## 2022-12-23 ENCOUNTER — Inpatient Hospital Stay: Payer: Medicare HMO | Admitting: Hematology & Oncology

## 2022-12-23 ENCOUNTER — Inpatient Hospital Stay: Payer: Medicare HMO

## 2022-12-23 ENCOUNTER — Encounter: Payer: Self-pay | Admitting: Hematology & Oncology

## 2022-12-23 ENCOUNTER — Other Ambulatory Visit: Payer: Self-pay | Admitting: Hematology & Oncology

## 2022-12-23 ENCOUNTER — Telehealth: Payer: Self-pay

## 2022-12-23 ENCOUNTER — Inpatient Hospital Stay: Payer: Medicare HMO | Attending: Hematology & Oncology

## 2022-12-23 ENCOUNTER — Other Ambulatory Visit (HOSPITAL_COMMUNITY): Payer: Self-pay

## 2022-12-23 ENCOUNTER — Other Ambulatory Visit: Payer: Self-pay

## 2022-12-23 VITALS — BP 109/86 | HR 74 | Temp 97.6°F | Resp 18 | Ht 68.0 in | Wt 195.0 lb

## 2022-12-23 DIAGNOSIS — C50919 Malignant neoplasm of unspecified site of unspecified female breast: Secondary | ICD-10-CM | POA: Diagnosis not present

## 2022-12-23 DIAGNOSIS — Z79818 Long term (current) use of other agents affecting estrogen receptors and estrogen levels: Secondary | ICD-10-CM | POA: Diagnosis not present

## 2022-12-23 DIAGNOSIS — D510 Vitamin B12 deficiency anemia due to intrinsic factor deficiency: Secondary | ICD-10-CM

## 2022-12-23 DIAGNOSIS — Z170 Estrogen receptor positive status [ER+]: Secondary | ICD-10-CM | POA: Insufficient documentation

## 2022-12-23 DIAGNOSIS — Z79899 Other long term (current) drug therapy: Secondary | ICD-10-CM | POA: Insufficient documentation

## 2022-12-23 DIAGNOSIS — C7951 Secondary malignant neoplasm of bone: Secondary | ICD-10-CM | POA: Diagnosis not present

## 2022-12-23 DIAGNOSIS — K921 Melena: Secondary | ICD-10-CM

## 2022-12-23 LAB — CMP (CANCER CENTER ONLY)
ALT: 15 U/L (ref 0–44)
AST: 17 U/L (ref 15–41)
Albumin: 4.2 g/dL (ref 3.5–5.0)
Alkaline Phosphatase: 45 U/L (ref 38–126)
Anion gap: 6 (ref 5–15)
BUN: 14 mg/dL (ref 6–20)
CO2: 27 mmol/L (ref 22–32)
Calcium: 9.1 mg/dL (ref 8.9–10.3)
Chloride: 105 mmol/L (ref 98–111)
Creatinine: 0.96 mg/dL (ref 0.44–1.00)
GFR, Estimated: >60 mL/min (ref >60)
Glucose, Bld: 94 mg/dL (ref 70–99)
Potassium: 4 mmol/L (ref 3.5–5.1)
Sodium: 138 mmol/L (ref 135–145)
Total Bilirubin: 0.5 mg/dL (ref 0.3–1.2)
Total Protein: 6.7 g/dL (ref 6.5–8.1)

## 2022-12-23 LAB — CBC WITH DIFFERENTIAL (CANCER CENTER ONLY)
Abs Immature Granulocytes: 0.01 10*3/uL (ref 0.00–0.07)
Basophils Absolute: 0.1 10*3/uL (ref 0.0–0.1)
Basophils Relative: 2 %
Eosinophils Absolute: 0.1 10*3/uL (ref 0.0–0.5)
Eosinophils Relative: 2 %
HCT: 35.8 % — ABNORMAL LOW (ref 36.0–46.0)
Hemoglobin: 12.5 g/dL (ref 12.0–15.0)
Immature Granulocytes: 0 %
Lymphocytes Relative: 38 %
Lymphs Abs: 1 10*3/uL (ref 0.7–4.0)
MCH: 35.2 pg — ABNORMAL HIGH (ref 26.0–34.0)
MCHC: 34.9 g/dL (ref 30.0–36.0)
MCV: 100.8 fL — ABNORMAL HIGH (ref 80.0–100.0)
Monocytes Absolute: 0.4 10*3/uL (ref 0.1–1.0)
Monocytes Relative: 16 %
Neutro Abs: 1.1 10*3/uL — ABNORMAL LOW (ref 1.7–7.7)
Neutrophils Relative %: 42 %
Platelet Count: 215 10*3/uL (ref 150–400)
RBC: 3.55 MIL/uL — ABNORMAL LOW (ref 3.87–5.11)
RDW: 14.9 % (ref 11.5–15.5)
WBC Count: 2.7 10*3/uL — ABNORMAL LOW (ref 4.0–10.5)
nRBC: 0 % (ref 0.0–0.2)

## 2022-12-23 LAB — IRON AND IRON BINDING CAPACITY (CC-WL,HP ONLY)
Iron: 87 ug/dL (ref 28–170)
Saturation Ratios: 29 % (ref 10.4–31.8)
TIBC: 302 ug/dL (ref 250–450)
UIBC: 215 ug/dL (ref 148–442)

## 2022-12-23 LAB — FERRITIN: Ferritin: 114 ng/mL (ref 11–307)

## 2022-12-23 LAB — VITAMIN B12: Vitamin B-12: 271 pg/mL (ref 180–914)

## 2022-12-23 MED ORDER — FULVESTRANT 250 MG/5ML IM SOSY
500.0000 mg | PREFILLED_SYRINGE | INTRAMUSCULAR | Status: DC
  Administered 2022-12-23: 500 mg via INTRAMUSCULAR

## 2022-12-23 NOTE — Patient Instructions (Signed)

## 2022-12-23 NOTE — Telephone Encounter (Signed)
-----   Message from Josph Macho sent at 12/23/2022  2:31 PM EDT ----- Please call and let her know that the iron studies and B12 study looks fine.  Thanks.Marland Kitchen

## 2022-12-23 NOTE — Progress Notes (Signed)
Hematology and Oncology Follow Up Visit  Caitlin Greene [id number] March 22, 1966 57 y.o. 12/23/2022   Principle Diagnosis:  Metastatic breast cancer-ER positive/HER-2 negative --bone metastasis only Iron deficiency anemia  Current Therapy:   Faslodex 500 mg IM monthly --start on 04/2020 Ibrance 100 mg p.o. daily (21d on/7d off) - start on 04/2020 Xgeva 120 mg subcu every 3 months -next dose 02/2023 IV iron-Venofer given on 10/21/2022       Interim History:  Caitlin Greene is in for follow-up.  Unfortunately, her sister passed away a couple weeks ago.  She had the CJ virus.  She was comfortable.  Caitlin Greene did a incredible job helping to take care of her.  Caitlin Greene has been doing okay otherwise.  I know she is tired from help without assist.  She also helps out her father.  Her breast cancer seems to be holding pretty steady.  Her last CA 27.29 was 46.  She has had no problems with nausea or vomiting.  She has had no change in bowel or bladder habits.  She had MRI of the pelvis.  This was done I think yesterday.  We do not have the results back yet.  She has had no fever.  There has been no bleeding.  She has had no rashes.  There is been no leg swelling.  Pain control wise she has been doing pretty well.  Overall, I would say that her performance status is ECOG 1.    Wt Readings from Last 3 Encounters:  12/23/22 195 lb (88.5 kg)  11/30/22 199 lb (90.3 kg)  11/25/22 199 lb 6.4 oz (90.4 kg)    Medications:  Current Outpatient Medications:    acetaminophen (TYLENOL) 650 MG CR tablet, Take 650 mg by mouth every 8 (eight) hours as needed for pain., Disp: , Rfl:    calcium carbonate (OS-CAL) 600 MG tablet, Take 1 tablet (600 mg total) by mouth 2 (two) times daily., Disp: 30 tablet, Rfl: 0   Ferrous Sulfate (IRON) 325 (65 Fe) MG TABS, Take 1 tablet (325 mg total) by mouth daily at 2 PM., Disp: 30 tablet, Rfl: 0   FLUoxetine (PROZAC) 20 MG capsule, TAKE 3 CAPSULES(60 MG) BY MOUTH  DAILY, Disp: 270 capsule, Rfl: 3   fulvestrant (FASLODEX) 250 MG/5ML injection, Inject 500 mg into the muscle every 30 (thirty) days. One injection each buttock over 1-2 minutes. Warm prior to use., Disp: , Rfl:    IBRANCE 100 MG tablet, TAKE 1 TABLET DAILY ON DAYS 1 THROUGH 21 OF CYCLE, 7 DAYS OFF. REPEAT, Disp: 21 tablet, Rfl: 0   lidocaine-prilocaine (EMLA) cream, Apply 1 Application topically as needed., Disp: 30 g, Rfl: 0   LORazepam (ATIVAN) 1 MG tablet, Take 1 tablet (1 mg total) by mouth once as needed for up to 1 dose for anxiety. Take 1 tablet (1 mg total) by mouth 20 minutes before MRI scan. Please have a driver for this test as this medication has sedating effects., Disp: 1 tablet, Rfl: 0   meclizine (ANTIVERT) 12.5 MG tablet, Take 1 tablet (12.5 mg total) by mouth 3 (three) times daily as needed for dizziness., Disp: 40 tablet, Rfl: 1   oxyCODONE (OXY IR/ROXICODONE) 5 MG immediate release tablet, Take 1 tablet (5 mg total) by mouth every 8 (eight) hours as needed for severe pain., Disp: 50 tablet, Rfl: 0   pantoprazole (PROTONIX) 40 MG tablet, TAKE 1 TABLET(40 MG) BY MOUTH TWICE DAILY BEFORE A MEAL, Disp: 60 tablet, Rfl: 5  vitamin B-12 (CYANOCOBALAMIN) 1000 MCG tablet, Take 1 tablet (1,000 mcg total) by mouth daily., Disp: 30 tablet, Rfl: 0   Wheat Dextrin (BENEFIBER DRINK MIX PO), Take by mouth daily at 6 (six) AM., Disp: , Rfl:  No current facility-administered medications for this visit.  Facility-Administered Medications Ordered in Other Visits:    fulvestrant (FASLODEX) injection 500 mg, 500 mg, Intramuscular, Q30 days, Ceairra Mccarver, Rose Phi, MD  Allergies:  Allergies  Allergen Reactions   Amoxicillin-Pot Clavulanate Hives and Itching   Iron Anaphylaxis and Other (See Comments)    Patient develops hypotension, resp distress after infusion of feraheme.    Penicillins Hives, Itching and Other (See Comments)   Codeine Nausea And Vomiting    Needs pre-meds     Past Medical  History, Surgical history, Social history, and Family History were reviewed and updated.  Review of Systems: Review of Systems  Constitutional:  Negative for appetite change.  HENT:  Negative.    Eyes: Negative.   Respiratory: Negative.    Cardiovascular: Negative.   Gastrointestinal:  Negative for abdominal pain and nausea.  Endocrine: Negative.   Genitourinary: Negative.    Musculoskeletal:  Positive for arthralgias and back pain.  Skin: Negative.   Neurological:  Negative for dizziness.  Hematological: Negative.   Psychiatric/Behavioral: Negative.      Physical Exam:  height is 5\' 8"  (1.727 m) and weight is 195 lb (88.5 kg). Her oral temperature is 97.6 F (36.4 C). Her blood pressure is 109/86 and her pulse is 74. Her respiration is 18 and oxygen saturation is 97%.   Wt Readings from Last 3 Encounters:  12/23/22 195 lb (88.5 kg)  11/30/22 199 lb (90.3 kg)  11/25/22 199 lb 6.4 oz (90.4 kg)    Physical Exam Vitals reviewed.  HENT:     Head: Normocephalic and atraumatic.  Eyes:     Pupils: Pupils are equal, round, and reactive to light.  Cardiovascular:     Rate and Rhythm: Normal rate and regular rhythm.     Heart sounds: Normal heart sounds.  Pulmonary:     Effort: Pulmonary effort is normal.     Breath sounds: Normal breath sounds.  Abdominal:     General: Bowel sounds are normal.     Palpations: Abdomen is soft.  Musculoskeletal:        General: No tenderness or deformity. Normal range of motion.     Cervical back: Normal range of motion.  Lymphadenopathy:     Cervical: No cervical adenopathy.  Skin:    General: Skin is warm and dry.     Findings: No erythema or rash.  Neurological:     Mental Status: She is alert and oriented to person, place, and time.  Psychiatric:        Behavior: Behavior normal.        Thought Content: Thought content normal.        Judgment: Judgment normal.      Lab Results  Component Value Date   WBC 2.7 (L) 12/23/2022    HGB 12.5 12/23/2022   HCT 35.8 (L) 12/23/2022   MCV 100.8 (H) 12/23/2022   PLT 215 12/23/2022     Chemistry      Component Value Date/Time   NA 138 12/23/2022 0915   K 4.0 12/23/2022 0915   CL 105 12/23/2022 0915   CO2 27 12/23/2022 0915   BUN 14 12/23/2022 0915   CREATININE 0.96 12/23/2022 0915   CREATININE 0.88 10/22/2019 1246  Component Value Date/Time   CALCIUM 9.1 12/23/2022 0915   ALKPHOS 45 12/23/2022 0915   AST 17 12/23/2022 0915   ALT 15 12/23/2022 0915   BILITOT 0.5 12/23/2022 0915       Impression and Plan: Caitlin Greene is a very charming 57 year old postmenopausal female with metastatic breast cancer.  We actually had seen her many years ago with a carcinoma in situ.  I feel better than her sister passed on.  I know this was a big "hit" for the family.  Thankfully, this is such a strong family.  I told her to hold her Ilda Foil for a week.  I do still think her white cell count is high enough right now.  We will continue to follow her along.  Her birthday is coming up on Labor Day Monday.  I know she will have a wonderful birthday.  We will plan to get her back to see [place] in another month.  I am glad that she is doing okay.  I know that she wants to be able to take care of her sister and father.

## 2022-12-23 NOTE — Patient Instructions (Signed)

## 2022-12-23 NOTE — Telephone Encounter (Signed)
Advised via MyChart.

## 2022-12-24 ENCOUNTER — Other Ambulatory Visit (HOSPITAL_COMMUNITY): Payer: Self-pay

## 2022-12-24 LAB — CANCER ANTIGEN 27.29: CA 27.29: 48.2 U/mL — ABNORMAL HIGH (ref 0.0–38.6)

## 2022-12-24 MED ORDER — IBRANCE 100 MG PO TABS
ORAL_TABLET | ORAL | 0 refills | Status: DC
  Filled 2022-12-24: qty 21, 28d supply, fill #0

## 2022-12-26 ENCOUNTER — Other Ambulatory Visit (HOSPITAL_COMMUNITY): Payer: Self-pay

## 2022-12-26 ENCOUNTER — Telehealth: Payer: Self-pay

## 2022-12-26 ENCOUNTER — Other Ambulatory Visit: Payer: Self-pay

## 2022-12-26 NOTE — Telephone Encounter (Signed)
Advised via MyChart.

## 2022-12-26 NOTE — Telephone Encounter (Signed)
-----   Message from Josph Macho sent at 12/24/2022  6:16 AM EDT ----- Please call and let her know that the tumor marker is holding stable.  Thanks.  Cindee Lame

## 2022-12-27 ENCOUNTER — Other Ambulatory Visit (HOSPITAL_COMMUNITY): Payer: Self-pay

## 2023-01-17 ENCOUNTER — Other Ambulatory Visit (HOSPITAL_COMMUNITY): Payer: Self-pay

## 2023-01-17 ENCOUNTER — Other Ambulatory Visit: Payer: Self-pay | Admitting: Hematology & Oncology

## 2023-01-17 ENCOUNTER — Other Ambulatory Visit: Payer: Self-pay

## 2023-01-17 MED ORDER — IBRANCE 100 MG PO TABS
ORAL_TABLET | ORAL | 0 refills | Status: DC
  Filled 2023-01-17: qty 21, 28d supply, fill #0

## 2023-01-19 ENCOUNTER — Telehealth: Payer: Self-pay

## 2023-01-19 ENCOUNTER — Inpatient Hospital Stay (HOSPITAL_BASED_OUTPATIENT_CLINIC_OR_DEPARTMENT_OTHER): Payer: Medicare HMO | Admitting: Hematology & Oncology

## 2023-01-19 ENCOUNTER — Inpatient Hospital Stay: Payer: Medicare HMO | Attending: Hematology & Oncology

## 2023-01-19 ENCOUNTER — Inpatient Hospital Stay: Payer: Medicare HMO

## 2023-01-19 ENCOUNTER — Other Ambulatory Visit (HOSPITAL_COMMUNITY): Payer: Self-pay

## 2023-01-19 ENCOUNTER — Other Ambulatory Visit: Payer: Self-pay

## 2023-01-19 ENCOUNTER — Encounter: Payer: Self-pay | Admitting: Hematology & Oncology

## 2023-01-19 VITALS — BP 121/76 | HR 80 | Temp 98.2°F | Resp 16 | Ht 68.0 in | Wt 198.0 lb

## 2023-01-19 DIAGNOSIS — Z79899 Other long term (current) drug therapy: Secondary | ICD-10-CM | POA: Diagnosis not present

## 2023-01-19 DIAGNOSIS — C7951 Secondary malignant neoplasm of bone: Secondary | ICD-10-CM | POA: Diagnosis not present

## 2023-01-19 DIAGNOSIS — Z79818 Long term (current) use of other agents affecting estrogen receptors and estrogen levels: Secondary | ICD-10-CM | POA: Insufficient documentation

## 2023-01-19 DIAGNOSIS — C50919 Malignant neoplasm of unspecified site of unspecified female breast: Secondary | ICD-10-CM | POA: Insufficient documentation

## 2023-01-19 LAB — CBC WITH DIFFERENTIAL (CANCER CENTER ONLY)
Abs Immature Granulocytes: 0.01 10*3/uL (ref 0.00–0.07)
Basophils Absolute: 0 10*3/uL (ref 0.0–0.1)
Basophils Relative: 1 %
Eosinophils Absolute: 0.1 10*3/uL (ref 0.0–0.5)
Eosinophils Relative: 4 %
HCT: 34.2 % — ABNORMAL LOW (ref 36.0–46.0)
Hemoglobin: 11.9 g/dL — ABNORMAL LOW (ref 12.0–15.0)
Immature Granulocytes: 0 %
Lymphocytes Relative: 33 %
Lymphs Abs: 0.8 10*3/uL (ref 0.7–4.0)
MCH: 35 pg — ABNORMAL HIGH (ref 26.0–34.0)
MCHC: 34.8 g/dL (ref 30.0–36.0)
MCV: 100.6 fL — ABNORMAL HIGH (ref 80.0–100.0)
Monocytes Absolute: 0.2 10*3/uL (ref 0.1–1.0)
Monocytes Relative: 9 %
Neutro Abs: 1.3 10*3/uL — ABNORMAL LOW (ref 1.7–7.7)
Neutrophils Relative %: 53 %
Platelet Count: 166 10*3/uL (ref 150–400)
RBC: 3.4 MIL/uL — ABNORMAL LOW (ref 3.87–5.11)
RDW: 13.5 % (ref 11.5–15.5)
Smear Review: NORMAL
WBC Count: 2.4 10*3/uL — ABNORMAL LOW (ref 4.0–10.5)
nRBC: 0 % (ref 0.0–0.2)

## 2023-01-19 LAB — CMP (CANCER CENTER ONLY)
ALT: 14 U/L (ref 0–44)
AST: 12 U/L — ABNORMAL LOW (ref 15–41)
Albumin: 4.2 g/dL (ref 3.5–5.0)
Alkaline Phosphatase: 48 U/L (ref 38–126)
Anion gap: 7 (ref 5–15)
BUN: 12 mg/dL (ref 6–20)
CO2: 27 mmol/L (ref 22–32)
Calcium: 9.1 mg/dL (ref 8.9–10.3)
Chloride: 105 mmol/L (ref 98–111)
Creatinine: 1.02 mg/dL — ABNORMAL HIGH (ref 0.44–1.00)
GFR, Estimated: >60 mL/min (ref >60)
Glucose, Bld: 97 mg/dL (ref 70–99)
Potassium: 3.8 mmol/L (ref 3.5–5.1)
Sodium: 139 mmol/L (ref 135–145)
Total Bilirubin: 0.8 mg/dL (ref 0.3–1.2)
Total Protein: 6.6 g/dL (ref 6.5–8.1)

## 2023-01-19 LAB — IRON AND IRON BINDING CAPACITY (CC-WL,HP ONLY)
Iron: 128 ug/dL (ref 28–170)
Saturation Ratios: 43 % — ABNORMAL HIGH (ref 10.4–31.8)
TIBC: 298 ug/dL (ref 250–450)
UIBC: 170 ug/dL (ref 148–442)

## 2023-01-19 LAB — FERRITIN: Ferritin: 111 ng/mL (ref 11–307)

## 2023-01-19 MED ORDER — HEPARIN SOD (PORK) LOCK FLUSH 100 UNIT/ML IV SOLN
500.0000 [IU] | Freq: Once | INTRAVENOUS | Status: AC
  Administered 2023-01-19: 500 [IU] via INTRAVENOUS

## 2023-01-19 MED ORDER — FULVESTRANT 250 MG/5ML IM SOSY
500.0000 mg | PREFILLED_SYRINGE | INTRAMUSCULAR | Status: DC
  Administered 2023-01-19: 500 mg via INTRAMUSCULAR

## 2023-01-19 MED ORDER — SODIUM CHLORIDE 0.9% FLUSH
10.0000 mL | INTRAVENOUS | Status: DC | PRN
  Administered 2023-01-19: 10 mL via INTRAVENOUS

## 2023-01-19 NOTE — Patient Instructions (Signed)

## 2023-01-19 NOTE — Telephone Encounter (Signed)
-----   Message from Josph Macho sent at 01/19/2023  1:54 PM EDT ----- Please call and let her know that the iron level is okay.  Thanks.  Cindee Lame

## 2023-01-19 NOTE — Telephone Encounter (Signed)
Called patient to inform her that her iron level is OK per Dr. Myna Hidalgo. Patient expressed gratitude for the information.

## 2023-01-19 NOTE — Progress Notes (Signed)
Hematology and Oncology Follow Up Visit  Caitlin Greene [id number] [birth year] 57 y.o. 01/19/2023   Principle Diagnosis:  Metastatic breast cancer-ER positive/HER-2 negative --bone metastasis only Iron deficiency anemia  Current Therapy:   Faslodex 500 mg IM monthly --start on 04/2020 Ibrance 100 mg p.o. daily (21d on/7d off) - start on 04/2020 Xgeva 120 mg subcu every 3 months -next dose 02/2023 IV iron-Venofer given on 10/21/2022       Interim History:  Caitlin Greene is in for follow-up.  It sounds like her dad is not doing all that well.  It sounds like he may be passing on soon.  He is in the [facility] right now.  This is where Caitlin Greene is going after she leaves here.  She did have MRI of the pelvis.  This showed a enhancing lesion in the right sacral alla measuring 1.2 cm.  She was complaining of pain over on the left side.  Everything looked fine on the left side.  I will see if [facility] we will see her to see about maybe some palliative radiation to this lesion before causes her problems.  Her last CA 27.29 was holding pretty stable at 48.  She is eating okay.  She is having no problems with nausea or vomiting.  She is having no issues with cough or shortness of breath.  She is having no obvious back pain.  There is no leg swelling.  She has had no rashes.  There is been no headache.  Her last iron studies that were done back in August showed a ferritin 114 with an iron saturation of 29%.  Overall, I would have said that her performance status is probably ECOG 1.      Wt Readings from Last 3 Encounters:  01/19/23 198 lb (89.8 kg)  12/23/22 195 lb (88.5 kg)  11/30/22 199 lb (90.3 kg)    Medications:  Current Outpatient Medications:    acetaminophen (TYLENOL) 650 MG CR tablet, Take 650 mg by mouth every 8 (eight) hours as needed for pain., Disp: , Rfl:    calcium carbonate (OS-CAL) 600 MG tablet, Take 1 tablet (600 mg total) by mouth 2 (two) times daily.,  Disp: 30 tablet, Rfl: 0   Ferrous Sulfate (IRON) 325 (65 Fe) MG TABS, Take 1 tablet (325 mg total) by mouth daily at 2 PM., Disp: 30 tablet, Rfl: 0   FLUoxetine (PROZAC) 20 MG capsule, TAKE 3 CAPSULES(60 MG) BY MOUTH DAILY, Disp: 270 capsule, Rfl: 3   fulvestrant (FASLODEX) 250 MG/5ML injection, Inject 500 mg into the muscle every 30 (thirty) days. One injection each buttock over 1-2 minutes. Warm prior to use., Disp: , Rfl:    IBRANCE 100 MG tablet, TAKE 1 TABLET DAILY ON DAYS 1 THROUGH 21 OF CYCLE, 7 DAYS OFF. REPEAT, Disp: 21 tablet, Rfl: 0   lidocaine-prilocaine (EMLA) cream, Apply 1 Application topically as needed., Disp: 30 g, Rfl: 0   LORazepam (ATIVAN) 1 MG tablet, Take 1 tablet (1 mg total) by mouth once as needed for up to 1 dose for anxiety. Take 1 tablet (1 mg total) by mouth 20 minutes before MRI scan. Please have a driver for this test as this medication has sedating effects., Disp: 1 tablet, Rfl: 0   meclizine (ANTIVERT) 12.5 MG tablet, Take 1 tablet (12.5 mg total) by mouth 3 (three) times daily as needed for dizziness., Disp: 40 tablet, Rfl: 1   oxyCODONE (OXY IR/ROXICODONE) 5 MG immediate release tablet, Take 1 tablet (5  mg total) by mouth every 8 (eight) hours as needed for severe pain., Disp: 50 tablet, Rfl: 0   pantoprazole (PROTONIX) 40 MG tablet, TAKE 1 TABLET(40 MG) BY MOUTH TWICE DAILY BEFORE A MEAL, Disp: 60 tablet, Rfl: 5   vitamin B-12 (CYANOCOBALAMIN) 1000 MCG tablet, Take 1 tablet (1,000 mcg total) by mouth daily., Disp: 30 tablet, Rfl: 0   Wheat Dextrin (BENEFIBER DRINK MIX PO), Take by mouth daily at 6 (six) AM., Disp: , Rfl:  No current facility-administered medications for this visit.  Facility-Administered Medications Ordered in Other Visits:    fulvestrant (FASLODEX) injection 500 mg, 500 mg, Intramuscular, Q30 days, Bandon Sherwin, Rose Phi, MD  Allergies:  Allergies  Allergen Reactions   Amoxicillin-Pot Clavulanate Hives and Itching   Iron Anaphylaxis and Other (See  Comments)    Patient develops hypotension, resp distress after infusion of feraheme.    Penicillins Hives, Itching and Other (See Comments)   Codeine Nausea And Vomiting    Needs pre-meds     Past Medical History, Surgical history, Social history, and Family History were reviewed and updated.  Review of Systems: Review of Systems  Constitutional:  Negative for appetite change.  HENT:  Negative.    Eyes: Negative.   Respiratory: Negative.    Cardiovascular: Negative.   Gastrointestinal:  Negative for abdominal pain and nausea.  Endocrine: Negative.   Genitourinary: Negative.    Musculoskeletal:  Positive for arthralgias and back pain.  Skin: Negative.   Neurological:  Negative for dizziness.  Hematological: Negative.   Psychiatric/Behavioral: Negative.      Physical Exam:  height is 5\' 8"  (1.727 m) and weight is 198 lb (89.8 kg). Her oral temperature is 98.2 F (36.8 C). Her blood pressure is 121/76 and her pulse is 80. Her respiration is 16 and oxygen saturation is 97%.   Wt Readings from Last 3 Encounters:  01/19/23 198 lb (89.8 kg)  12/23/22 195 lb (88.5 kg)  11/30/22 199 lb (90.3 kg)    Physical Exam Vitals reviewed.  HENT:     Head: Normocephalic and atraumatic.  Eyes:     Pupils: Pupils are equal, round, and reactive to light.  Cardiovascular:     Rate and Rhythm: Normal rate and regular rhythm.     Heart sounds: Normal heart sounds.  Pulmonary:     Effort: Pulmonary effort is normal.     Breath sounds: Normal breath sounds.  Abdominal:     General: Bowel sounds are normal.     Palpations: Abdomen is soft.  Musculoskeletal:        General: No tenderness or deformity. Normal range of motion.     Cervical back: Normal range of motion.  Lymphadenopathy:     Cervical: No cervical adenopathy.  Skin:    General: Skin is warm and dry.     Findings: No erythema or rash.  Neurological:     Mental Status: She is alert and oriented to person, place, and time.   Psychiatric:        Behavior: Behavior normal.        Thought Content: Thought content normal.        Judgment: Judgment normal.      Lab Results  Component Value Date   WBC 2.4 (L) 01/19/2023   HGB 11.9 (L) 01/19/2023   HCT 34.2 (L) 01/19/2023   MCV 100.6 (H) 01/19/2023   PLT 166 01/19/2023     Chemistry      Component Value Date/Time   NA  139 01/19/2023 1015   K 3.8 01/19/2023 1015   CL 105 01/19/2023 1015   CO2 27 01/19/2023 1015   BUN 12 01/19/2023 1015   CREATININE 1.02 (H) 01/19/2023 1015   CREATININE 0.88 10/22/2019 1246      Component Value Date/Time   CALCIUM 9.1 01/19/2023 1015   ALKPHOS 48 01/19/2023 1015   AST 12 (L) 01/19/2023 1015   ALT 14 01/19/2023 1015   BILITOT 0.8 01/19/2023 1015       Impression and Plan: Caitlin Greene is a very charming 57 year old postmenopausal female with metastatic breast cancer.  We actually had seen her many years ago with a carcinoma in situ.  Again, her father sounds like he will pass on real soon.  They will get hospice for him.  Again I will speak with Radiation Oncology.  We will see if they may think if radiation will help.  She had her last scans that were done back in July.  We wil set her up with scans in October.  I will plan to see her back in 1 more month.  After this next visit, we will then set up her scans.

## 2023-01-19 NOTE — Patient Instructions (Signed)

## 2023-01-20 ENCOUNTER — Telehealth: Payer: Self-pay | Admitting: *Deleted

## 2023-01-20 ENCOUNTER — Encounter: Payer: Self-pay | Admitting: Hematology & Oncology

## 2023-01-20 LAB — CANCER ANTIGEN 27.29: CA 27.29: 46.8 U/mL — ABNORMAL HIGH (ref 0.0–38.6)

## 2023-01-20 NOTE — Telephone Encounter (Signed)
Msg sent to pt

## 2023-01-20 NOTE — Telephone Encounter (Signed)
-----   Message from Josph Macho sent at 01/20/2023  7:15 AM EDT ----- Please call and let her know that the tumor marker is stable.  Also let her know that I spoke with Dr. Roselind Messier.  He will call her and get radiation for that right sacral lesion.  Thanks.Marland Kitchen  Cindee Lame

## 2023-01-30 ENCOUNTER — Ambulatory Visit: Payer: Medicare HMO

## 2023-01-30 ENCOUNTER — Ambulatory Visit: Payer: Medicare HMO | Admitting: Radiation Oncology

## 2023-02-02 ENCOUNTER — Ambulatory Visit: Payer: BC Managed Care – PPO | Admitting: Internal Medicine
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
## 2023-02-05 NOTE — Progress Notes (Signed)
Radiation Oncology         [phone] ________________________________  Follow-up New Visit   Outpatient   Name: Caitlin Greene MRN: [id number]  Date: 02/06/2023  DOB: 10-Apr-1966  DG:UYQI, Len Blalock, MD  Josph Macho, MD   REFERRING PHYSICIAN: Josph Macho, MD  DIAGNOSIS: {There were no encounter diagnoses. (Refresh or delete this SmartLink)}  History of ductal carcinoma in situ diagnosed 30 years ago which was treated with right mastectomy and lymph node dissection at [facility].    Bony metastatic adenocarcinoma of multiple sites (from breast cancer primary) diagnosed in 2021; s/p chemotherapy and currently on antiestrogens. Evidence of several new possible sites of osseous metastatic disease in the left iliac bone and T7 (July 2024). Now with evidence of new osseous metastatic disease to the right sacral ala (August 2024)  Narrative / Interval History - 02/06/23::Caitlin Greene is a 57 y.o. female who returns today to further discuss the role of radiation therapy in management of a new site of osseous metastatic disease to the right sacral ala. To review from his last visit here on 11/30/22, we reviewed several x-ray studies which did not show any correlative findings to explain the patient's symptoms (left sided hip pain). In light of this, she was recommended an MRI of the pelvis for further evaluation of the previously demonstrated left iliac lesions.    Subsequent MRI of the pelvis with and without contrast on 12/22/22 which showed no definite new metastatic lesions in the left iliac bone. However, an enhancing metastatic lesion in the right sacral ala measuring 1.2 cm was demonstrated. MRI otherwise showed now new lesions or progressive findings correlating with the areas of previously treated metastatic disease, including the right L5 body, right iliac bone, and left ischium.   Dr. Myna Hidalgo has reviewed MRI findings with the patient and recommends radiation therapy to the new right  sacral ala lesion which we will discuss today. '  Of note: she received her most recent faslodex injection on 01/19/23.   ***  HPI / Interval History - 11/30/22 ::Caitlin Greene is a 57 y.o. female who is accompanied by her supportive friend. she is seen as a courtesy of Dr. Myna Hidalgo for an opinion concerning radiation therapy as part of management for a couple of new possible sites of osseous metastatic disease in the left iliac bone and T7.   The patient was last seen here for re-evaluation on 06/17/21 . At which time, we reviewed imaging which showed uptake in the mid back along the lower thoracic spine. We discussed palliative radiation therapy to the 3 areas in the right pelvis and left pelvis as well as the lower thoracic spine. However, her most recent bone scan and MRI of the pelvis at that time reflected a positive response to Ibrance and Faslodex. She was also relatively asymptomatic at that time. Based on the lack of significant activity on recent imaging, and her minimal symptomatology, I recommended holding off on radiation therapy at that time.    Since she was last seen here, the patient has continued to follow with Dr. Myna Hidalgo and has continued to receive Ibrance and Faslodex along with Xgeva q3 months. Until very recent history (noted below), she has remained without evidence of disease progression on routine imaging. She has also continued to be asymptomatic overall.    She recently presented for a routine bone scan on 11/21/22 which showed nonspecific new punctate increased radiotracer uptake of the left iliac bone, and mild increased uptake of  the lateral approximate T7 vertebral body which may be degenerative in etiology. Imaging otherwise stable appearing osseous metastatic disease.    Her most recent CT CAP on 11/21/22 otherwise shows stable multifocal osseous metastatic disease and no evidence of soft tissue metastasis within the chest, abdomen, or pelvis. (Imaging also shows  stability of the tiny bilateral pulmonary nodules and stability of the left adrenal nodule favoring an adenoma).    Patient has been having 4/10 intermittent pain to her left hip and groin for the past week and a half. She had had to take Tylenol which does seem to help with the pain. She denies any known trauma to this area.    FUN Visit - 06/17/21: The patient returns today to review MRI imaging. To review from her initial consultation on 05/31/21, we discussed how the cause of her bilateral hip pain was not explained on any recent imaging studies. Therefore, I suggested an MRI of the pelvis to better evaluate the extent of her metastatic disease.    Pelvic MRI on 06/10/21 demonstrated multiple mixed lytic and sclerotic lesions with mild heterogeneous enhancement in the pelvis, and partially visualized lumbar spine consistent with osseous metastases. Sites seen on MRI imaging include (but are not limited to) the right iliac wing, posterior right iliac bone, and left ischial tuberosity.  Lesions overall appeared similar in size in distribution in comparison to prior CT in December 2022. Intralesional fat was also appreciated in several of these lesions, which may reflect post treatment change. Lesions in the anterior right iliac bone and ischial tuberosity were also seen to demonstrate mildly reduced diffusion. (No lesions were appreciated in the proximal femurs).    In the interval since she was last seen, the patient also met with Dr. Myna Hidalgo on 06/14/21. During which time, Dr. Myna Hidalgo discussed continuing her on Ibrance and Faslodex. The patient denied any cough, SOB, nausea, vomiting, leg swelling, or headache.  Dr. Myna Hidalgo also reviewed her recent MRI and noted that findings were again hard to interpret in terms of active disease. She will return to Dr. Myna Hidalgo in one month for routine follow-up.    PREVIOUS RADIATION THERAPY: No PAST MEDICAL HISTORY:  Past Medical History:  Diagnosis Date    ALLERGIC RHINITIS    ANEMIA-IRON DEFICIENCY    ANXIETY    BREAST CANCER, HX OF 01/21/2007   at 57yo   DEPRESSION    GERD    HYPERLIPIDEMIA    Metastatic cancer to bone (HCC) dx'd 10/24/2019   recurrent breast ca   Pneumonia 02/2022   RENAL CALCULUS    rt breast ca dx'd 1988    PAST SURGICAL HISTORY: Past Surgical History:  Procedure Laterality Date   BIOPSY  03/21/2020   Procedure: BIOPSY;  Surgeon: Tressia Danas, MD;  Location: [facility];  Service: Gastroenterology;;   BIOPSY  09/13/2021   Procedure: BIOPSY;  Surgeon: Rachael Fee, MD;  Location: [facility];  Service: Gastroenterology;;   BIOPSY  10/19/2021   Procedure: BIOPSY;  Surgeon: Tressia Danas, MD;  Location: [facility];  Service: Gastroenterology;;   BREAST ENHANCEMENT SURGERY     COLONOSCOPY WITH PROPOFOL N/A 10/19/2021   Procedure: COLONOSCOPY WITH PROPOFOL;  Surgeon: Tressia Danas, MD;  Location: [facility];  Service: Gastroenterology;  Laterality: N/A;   ENTEROSCOPY N/A 10/19/2021   Procedure: ENTEROSCOPY;  Surgeon: Tressia Danas, MD;  Location: [facility];  Service: Gastroenterology;  Laterality: N/A;   ESOPHAGOGASTRODUODENOSCOPY N/A 10/16/2021   Procedure: ESOPHAGOGASTRODUODENOSCOPY (EGD);  Surgeon: Jenel Lucks, MD;  Location: [facility];  Service: Gastroenterology;  Laterality: N/A;   ESOPHAGOGASTRODUODENOSCOPY (EGD) WITH PROPOFOL N/A 03/21/2020   Procedure: ESOPHAGOGASTRODUODENOSCOPY (EGD) WITH PROPOFOL;  Surgeon: Tressia Danas, MD;  Location: [facility];  Service: Gastroenterology;  Laterality: N/A;   ESOPHAGOGASTRODUODENOSCOPY (EGD) WITH PROPOFOL N/A 09/13/2021   Procedure: ESOPHAGOGASTRODUODENOSCOPY (EGD) WITH PROPOFOL;  Surgeon: Rachael Fee, MD;  Location: [facility];  Service: Gastroenterology;  Laterality: N/A;   IR IMAGING GUIDED PORT INSERTION  10/04/2022   MASTECTOMY     right   POLYPECTOMY  10/19/2021   Procedure: POLYPECTOMY;  Surgeon: Tressia Danas,  MD;  Location: [facility];  Service: Gastroenterology;;   TEMPOROMANDIBULAR JOINT SURGERY     Left   TONSILLECTOMY      FAMILY HISTORY:  Family History  Problem Relation Age of Onset   Colon polyps Mother    Cancer Mother        Uterine Cancer   Hypertension Mother    Dementia Mother    Colon polyps Father    COPD Father        smoked   Hypertension Other    Diabetes Other    Asthma Sister     SOCIAL HISTORY:  Social History   Tobacco Use   Smoking status: Never   Smokeless tobacco: Never  Vaping Use   Vaping status: Never Used  Substance Use Topics   Alcohol use: No    Alcohol/week: 0.0 standard drinks of alcohol   Drug use: No    ALLERGIES:  Allergies  Allergen Reactions   Amoxicillin-Pot Clavulanate Hives and Itching   Iron Anaphylaxis and Other (See Comments)    Patient develops hypotension, resp distress after infusion of feraheme.    Penicillins Hives, Itching and Other (See Comments)   Codeine Nausea And Vomiting    Needs pre-meds     MEDICATIONS:  Current Outpatient Medications  Medication Sig Dispense Refill   acetaminophen (TYLENOL) 650 MG CR tablet Take 650 mg by mouth every 8 (eight) hours as needed for pain.     calcium carbonate (OS-CAL) 600 MG tablet Take 1 tablet (600 mg total) by mouth 2 (two) times daily. 30 tablet 0   Ferrous Sulfate (IRON) 325 (65 Fe) MG TABS Take 1 tablet (325 mg total) by mouth daily at 2 PM. 30 tablet 0   FLUoxetine (PROZAC) 20 MG capsule TAKE 3 CAPSULES(60 MG) BY MOUTH DAILY 270 capsule 3   fulvestrant (FASLODEX) 250 MG/5ML injection Inject 500 mg into the muscle every 30 (thirty) days. One injection each buttock over 1-2 minutes. Warm prior to use.     IBRANCE 100 MG tablet TAKE 1 TABLET DAILY ON DAYS 1 THROUGH 21 OF CYCLE, 7 DAYS OFF. REPEAT 21 tablet 0   lidocaine-prilocaine (EMLA) cream Apply 1 Application topically as needed. 30 g 0   LORazepam (ATIVAN) 1 MG tablet Take 1 tablet (1 mg total) by mouth once as  needed for up to 1 dose for anxiety. Take 1 tablet (1 mg total) by mouth 20 minutes before MRI scan. Please have a driver for this test as this medication has sedating effects. 1 tablet 0   meclizine (ANTIVERT) 12.5 MG tablet Take 1 tablet (12.5 mg total) by mouth 3 (three) times daily as needed for dizziness. 40 tablet 1   oxyCODONE (OXY IR/ROXICODONE) 5 MG immediate release tablet Take 1 tablet (5 mg total) by mouth every 8 (eight) hours as needed for severe pain. 50 tablet 0   pantoprazole (PROTONIX) 40 MG tablet TAKE  1 TABLET(40 MG) BY MOUTH TWICE DAILY BEFORE A MEAL 60 tablet 5   vitamin B-12 (CYANOCOBALAMIN) 1000 MCG tablet Take 1 tablet (1,000 mcg total) by mouth daily. 30 tablet 0   Wheat Dextrin (BENEFIBER DRINK MIX PO) Take by mouth daily at 6 (six) AM.     No current facility-administered medications for this encounter.    REVIEW OF SYSTEMS:  A 10+ POINT REVIEW OF SYSTEMS WAS OBTAINED including neurology, dermatology, psychiatry, cardiac, respiratory, lymph, extremities, GI, GU, musculoskeletal, constitutional, reproductive, HEENT. ***   PHYSICAL EXAM:  vitals were not taken for this visit.   General: Alert and oriented, in no acute distress HEENT: Head is normocephalic. Extraocular movements are intact. Oropharynx is clear. Neck: Neck is supple, no palpable cervical or supraclavicular lymphadenopathy. Heart: Regular in rate and rhythm with no murmurs, rubs, or gallops. Chest: Clear to auscultation bilaterally, with no rhonchi, wheezes, or rales. Abdomen: Soft, nontender, nondistended, with no rigidity or guarding. Extremities: No cyanosis or edema. Lymphatics: see Neck Exam Skin: No concerning lesions. Musculoskeletal: symmetric strength and muscle tone throughout. Neurologic: Cranial nerves II through XII are grossly intact. No obvious focalities. Speech is fluent. Coordination is intact. Psychiatric: Judgment and insight are intact. Affect is appropriate. ***  ECOG = ***  0  - Asymptomatic (Fully active, able to carry on all predisease activities without restriction)  1 - Symptomatic but completely ambulatory (Restricted in physically strenuous activity but ambulatory and able to carry out work of a light or sedentary nature. For example, light housework, office work)  2 - Symptomatic, <50% in bed during the day (Ambulatory and capable of all self care but unable to carry out any work activities. Up and about more than 50% of waking hours)  3 - Symptomatic, >50% in bed, but not bedbound (Capable of only limited self-care, confined to bed or chair 50% or more of waking hours)  4 - Bedbound (Completely disabled. Cannot carry on any self-care. Totally confined to bed or chair)  5 - Death   Santiago Glad MM, Creech RH, Tormey DC, et al. [phone]). "Toxicity and response criteria of the [facility]". Am. Evlyn Clines. Oncol. 5 (6): 649-55  LABORATORY DATA:  Lab Results  Component Value Date   WBC 2.4 (L) 01/19/2023   HGB 11.9 (L) 01/19/2023   HCT 34.2 (L) 01/19/2023   MCV 100.6 (H) 01/19/2023   PLT 166 01/19/2023   NEUTROABS 1.3 (L) 01/19/2023   Lab Results  Component Value Date   NA 139 01/19/2023   K 3.8 01/19/2023   CL 105 01/19/2023   CO2 27 01/19/2023   GLUCOSE 97 01/19/2023   BUN 12 01/19/2023   CREATININE 1.02 (H) 01/19/2023   CALCIUM 9.1 01/19/2023      RADIOGRAPHY: No results found.    IMPRESSION: Bony metastatic adenocarcinoma of multiple sites (from breast cancer primary) diagnosed in 2021; s/p chemotherapy and currently on antiestrogens. Evidence of several new possible sites of osseous metastatic disease in the left iliac bone and T7 (July 2024). Now with evidence of new osseous metastatic disease to the right sacral ala (August 2024)  ***  Today, I talked to the patient and family about the findings and work-up thus far.  We discussed the natural history of *** and general treatment, highlighting the role of radiotherapy in the  management.  We discussed the available radiation techniques, and focused on the details of logistics and delivery.  We reviewed the anticipated acute and late sequelae associated with radiation  in this setting.  The patient was encouraged to ask questions that I answered to the best of my ability. *** A patient consent form was discussed and signed.  We retained a copy for our records.  The patient would like to proceed with radiation and will be scheduled for CT simulation.  PLAN: ***    *** minutes of total time was spent for this patient encounter, including preparation, face-to-face counseling with the patient and coordination of care, physical exam, and documentation of the encounter.   ------------------------------------------------  Billie Lade, PhD, MD  This document serves as a record of services personally performed by Antony Blackbird, MD. It was created on his behalf by Neena Rhymes, a trained medical scribe. The creation of this record is based on the scribe's personal observations and the provider's statements to them. This document has been checked and approved by the attending provider.

## 2023-02-06 ENCOUNTER — Ambulatory Visit
Admission: RE | Admit: 2023-02-06 | Discharge: 2023-02-06 | Disposition: A | Discharge status: Home / Self Care | Payer: Medicare HMO | Source: Ambulatory Visit | Attending: Radiation Oncology | Admitting: Radiation Oncology

## 2023-02-06 ENCOUNTER — Encounter: Payer: Self-pay | Admitting: Radiation Oncology

## 2023-02-06 VITALS — BP 126/90 | HR 102 | Temp 97.2°F | Resp 18 | Ht 68.0 in | Wt 199.0 lb

## 2023-02-06 DIAGNOSIS — C50911 Malignant neoplasm of unspecified site of right female breast: Secondary | ICD-10-CM | POA: Diagnosis not present

## 2023-02-06 DIAGNOSIS — K219 Gastro-esophageal reflux disease without esophagitis: Secondary | ICD-10-CM | POA: Diagnosis not present

## 2023-02-06 DIAGNOSIS — C7951 Secondary malignant neoplasm of bone: Secondary | ICD-10-CM | POA: Diagnosis not present

## 2023-02-06 DIAGNOSIS — R918 Other nonspecific abnormal finding of lung field: Secondary | ICD-10-CM | POA: Insufficient documentation

## 2023-02-06 DIAGNOSIS — M533 Sacrococcygeal disorders, not elsewhere classified: Secondary | ICD-10-CM | POA: Diagnosis not present

## 2023-02-06 DIAGNOSIS — E785 Hyperlipidemia, unspecified: Secondary | ICD-10-CM | POA: Diagnosis not present

## 2023-02-06 DIAGNOSIS — Z79899 Other long term (current) drug therapy: Secondary | ICD-10-CM | POA: Insufficient documentation

## 2023-02-06 DIAGNOSIS — Z510 Encounter for antineoplastic radiation therapy: Secondary | ICD-10-CM | POA: Insufficient documentation

## 2023-02-06 DIAGNOSIS — Z923 Personal history of irradiation: Secondary | ICD-10-CM | POA: Insufficient documentation

## 2023-02-06 DIAGNOSIS — Z170 Estrogen receptor positive status [ER+]: Secondary | ICD-10-CM | POA: Insufficient documentation

## 2023-02-06 DIAGNOSIS — Z87442 Personal history of urinary calculi: Secondary | ICD-10-CM | POA: Insufficient documentation

## 2023-02-16 ENCOUNTER — Inpatient Hospital Stay: Payer: Medicare HMO

## 2023-02-16 ENCOUNTER — Other Ambulatory Visit: Payer: Self-pay

## 2023-02-16 ENCOUNTER — Inpatient Hospital Stay: Payer: Medicare HMO | Attending: Hematology & Oncology

## 2023-02-16 ENCOUNTER — Inpatient Hospital Stay: Payer: Medicare HMO | Admitting: Hematology & Oncology

## 2023-02-16 ENCOUNTER — Encounter: Payer: Self-pay | Admitting: Hematology & Oncology

## 2023-02-16 ENCOUNTER — Ambulatory Visit
Admission: RE | Admit: 2023-02-16 | Discharge: 2023-02-16 | Disposition: A | Discharge status: Home / Self Care | Payer: Medicare HMO | Source: Ambulatory Visit | Attending: Radiation Oncology | Admitting: Radiation Oncology

## 2023-02-16 ENCOUNTER — Other Ambulatory Visit: Payer: Self-pay | Admitting: Hematology & Oncology

## 2023-02-16 DIAGNOSIS — C7951 Secondary malignant neoplasm of bone: Secondary | ICD-10-CM | POA: Insufficient documentation

## 2023-02-16 DIAGNOSIS — Z5111 Encounter for antineoplastic chemotherapy: Secondary | ICD-10-CM | POA: Insufficient documentation

## 2023-02-16 DIAGNOSIS — C50911 Malignant neoplasm of unspecified site of right female breast: Secondary | ICD-10-CM | POA: Diagnosis not present

## 2023-02-16 DIAGNOSIS — Z170 Estrogen receptor positive status [ER+]: Secondary | ICD-10-CM | POA: Insufficient documentation

## 2023-02-16 DIAGNOSIS — Z510 Encounter for antineoplastic radiation therapy: Secondary | ICD-10-CM | POA: Diagnosis not present

## 2023-02-16 DIAGNOSIS — Z923 Personal history of irradiation: Secondary | ICD-10-CM | POA: Insufficient documentation

## 2023-02-16 DIAGNOSIS — D509 Iron deficiency anemia, unspecified: Secondary | ICD-10-CM | POA: Insufficient documentation

## 2023-02-16 DIAGNOSIS — C50919 Malignant neoplasm of unspecified site of unspecified female breast: Secondary | ICD-10-CM | POA: Insufficient documentation

## 2023-02-16 MED ORDER — IBRANCE 100 MG PO TABS
ORAL_TABLET | ORAL | 0 refills | Status: DC
  Filled 2023-02-16: qty 21, 28d supply, fill #0

## 2023-02-16 NOTE — Progress Notes (Signed)
Refill received

## 2023-02-16 NOTE — Progress Notes (Signed)
Specialty Pharmacy Refill Coordination Note  Haleema Vanderheyden is a 57 y.o. female contacted today regarding refills of specialty medication(s) Palbociclib   Patient requested Delivery   Delivery date: 02/22/23   Verified address: [address]  [place] [address]   Medication will be filled on 02/21/23 pending refill request.

## 2023-02-20 ENCOUNTER — Other Ambulatory Visit: Payer: Self-pay

## 2023-02-20 DIAGNOSIS — Z170 Estrogen receptor positive status [ER+]: Secondary | ICD-10-CM | POA: Diagnosis not present

## 2023-02-20 DIAGNOSIS — C50911 Malignant neoplasm of unspecified site of right female breast: Secondary | ICD-10-CM | POA: Diagnosis not present

## 2023-02-20 DIAGNOSIS — C7951 Secondary malignant neoplasm of bone: Secondary | ICD-10-CM | POA: Diagnosis not present

## 2023-02-20 DIAGNOSIS — Z510 Encounter for antineoplastic radiation therapy: Secondary | ICD-10-CM | POA: Diagnosis not present

## 2023-02-20 LAB — RAD ONC ARIA SESSION SUMMARY
Course Elapsed Days: 0
Plan Fractions Treated to Date: 1
Plan Prescribed Dose Per Fraction: 3 Gy
Plan Total Fractions Prescribed: 10
Plan Total Prescribed Dose: 30 Gy
Reference Point Dosage Given to Date: 3 Gy
Reference Point Session Dosage Given: 3 Gy
Session Number: 1

## 2023-02-21 ENCOUNTER — Ambulatory Visit
Admission: RE | Admit: 2023-02-21 | Discharge: 2023-02-21 | Disposition: A | Discharge status: Home / Self Care | Payer: Medicare HMO | Source: Ambulatory Visit | Attending: Radiation Oncology | Admitting: Radiation Oncology

## 2023-02-21 ENCOUNTER — Other Ambulatory Visit: Payer: Self-pay

## 2023-02-21 DIAGNOSIS — Z170 Estrogen receptor positive status [ER+]: Secondary | ICD-10-CM | POA: Diagnosis not present

## 2023-02-21 DIAGNOSIS — Z510 Encounter for antineoplastic radiation therapy: Secondary | ICD-10-CM | POA: Diagnosis not present

## 2023-02-21 DIAGNOSIS — C7951 Secondary malignant neoplasm of bone: Secondary | ICD-10-CM | POA: Diagnosis not present

## 2023-02-21 DIAGNOSIS — C50911 Malignant neoplasm of unspecified site of right female breast: Secondary | ICD-10-CM | POA: Diagnosis not present

## 2023-02-21 LAB — RAD ONC ARIA SESSION SUMMARY
Course Elapsed Days: 1
Plan Fractions Treated to Date: 2
Plan Prescribed Dose Per Fraction: 3 Gy
Plan Total Fractions Prescribed: 10
Plan Total Prescribed Dose: 30 Gy
Reference Point Dosage Given to Date: 6 Gy
Reference Point Session Dosage Given: 3 Gy
Session Number: 2

## 2023-02-22 ENCOUNTER — Ambulatory Visit
Admission: RE | Admit: 2023-02-22 | Discharge: 2023-02-22 | Disposition: A | Discharge status: Home / Self Care | Payer: Medicare HMO | Source: Ambulatory Visit | Attending: Radiation Oncology | Admitting: Radiation Oncology

## 2023-02-22 ENCOUNTER — Other Ambulatory Visit: Payer: Self-pay

## 2023-02-22 DIAGNOSIS — C7951 Secondary malignant neoplasm of bone: Secondary | ICD-10-CM | POA: Diagnosis not present

## 2023-02-22 DIAGNOSIS — Z170 Estrogen receptor positive status [ER+]: Secondary | ICD-10-CM | POA: Diagnosis not present

## 2023-02-22 DIAGNOSIS — C50911 Malignant neoplasm of unspecified site of right female breast: Secondary | ICD-10-CM | POA: Diagnosis not present

## 2023-02-22 DIAGNOSIS — Z510 Encounter for antineoplastic radiation therapy: Secondary | ICD-10-CM | POA: Diagnosis not present

## 2023-02-22 LAB — RAD ONC ARIA SESSION SUMMARY
Course Elapsed Days: 2
Plan Fractions Treated to Date: 3
Plan Prescribed Dose Per Fraction: 3 Gy
Plan Total Fractions Prescribed: 10
Plan Total Prescribed Dose: 30 Gy
Reference Point Dosage Given to Date: 9 Gy
Reference Point Session Dosage Given: 3 Gy
Session Number: 3

## 2023-02-23 ENCOUNTER — Ambulatory Visit
Admission: RE | Admit: 2023-02-23 | Discharge: 2023-02-23 | Disposition: A | Discharge status: Home / Self Care | Payer: Medicare HMO | Source: Ambulatory Visit | Attending: Radiation Oncology | Admitting: Radiation Oncology

## 2023-02-23 ENCOUNTER — Other Ambulatory Visit: Payer: Self-pay

## 2023-02-23 DIAGNOSIS — Z170 Estrogen receptor positive status [ER+]: Secondary | ICD-10-CM | POA: Diagnosis not present

## 2023-02-23 DIAGNOSIS — Z510 Encounter for antineoplastic radiation therapy: Secondary | ICD-10-CM | POA: Diagnosis not present

## 2023-02-23 DIAGNOSIS — C50911 Malignant neoplasm of unspecified site of right female breast: Secondary | ICD-10-CM | POA: Diagnosis not present

## 2023-02-23 DIAGNOSIS — C7951 Secondary malignant neoplasm of bone: Secondary | ICD-10-CM | POA: Diagnosis not present

## 2023-02-23 LAB — RAD ONC ARIA SESSION SUMMARY
Course Elapsed Days: 3
Plan Fractions Treated to Date: 4
Plan Prescribed Dose Per Fraction: 3 Gy
Plan Total Fractions Prescribed: 10
Plan Total Prescribed Dose: 30 Gy
Reference Point Dosage Given to Date: 12 Gy
Reference Point Session Dosage Given: 3 Gy
Session Number: 4

## 2023-02-24 ENCOUNTER — Ambulatory Visit
Admission: RE | Admit: 2023-02-24 | Discharge: 2023-02-24 | Disposition: A | Discharge status: Home / Self Care | Payer: Medicare HMO | Source: Ambulatory Visit | Attending: Radiation Oncology | Admitting: Radiation Oncology

## 2023-02-24 ENCOUNTER — Other Ambulatory Visit: Payer: Self-pay

## 2023-02-24 DIAGNOSIS — Z510 Encounter for antineoplastic radiation therapy: Secondary | ICD-10-CM | POA: Diagnosis not present

## 2023-02-24 DIAGNOSIS — C7951 Secondary malignant neoplasm of bone: Secondary | ICD-10-CM | POA: Diagnosis not present

## 2023-02-24 DIAGNOSIS — Z170 Estrogen receptor positive status [ER+]: Secondary | ICD-10-CM | POA: Diagnosis not present

## 2023-02-24 DIAGNOSIS — C50911 Malignant neoplasm of unspecified site of right female breast: Secondary | ICD-10-CM | POA: Diagnosis not present

## 2023-02-24 LAB — RAD ONC ARIA SESSION SUMMARY
Course Elapsed Days: 4
Plan Fractions Treated to Date: 5
Plan Prescribed Dose Per Fraction: 3 Gy
Plan Total Fractions Prescribed: 10
Plan Total Prescribed Dose: 30 Gy
Reference Point Dosage Given to Date: 15 Gy
Reference Point Session Dosage Given: 3 Gy
Session Number: 5

## 2023-02-27 ENCOUNTER — Other Ambulatory Visit: Payer: Self-pay

## 2023-02-27 ENCOUNTER — Ambulatory Visit
Admission: RE | Admit: 2023-02-27 | Discharge: 2023-02-27 | Disposition: A | Discharge status: Home / Self Care | Payer: Medicare HMO | Source: Ambulatory Visit | Attending: Radiation Oncology | Admitting: Radiation Oncology

## 2023-02-27 DIAGNOSIS — C7951 Secondary malignant neoplasm of bone: Secondary | ICD-10-CM | POA: Diagnosis not present

## 2023-02-27 DIAGNOSIS — Z170 Estrogen receptor positive status [ER+]: Secondary | ICD-10-CM | POA: Diagnosis not present

## 2023-02-27 DIAGNOSIS — C50911 Malignant neoplasm of unspecified site of right female breast: Secondary | ICD-10-CM | POA: Diagnosis not present

## 2023-02-27 DIAGNOSIS — Z510 Encounter for antineoplastic radiation therapy: Secondary | ICD-10-CM | POA: Diagnosis not present

## 2023-02-27 LAB — RAD ONC ARIA SESSION SUMMARY
Course Elapsed Days: 7
Plan Fractions Treated to Date: 6
Plan Prescribed Dose Per Fraction: 3 Gy
Plan Total Fractions Prescribed: 10
Plan Total Prescribed Dose: 30 Gy
Reference Point Dosage Given to Date: 18 Gy
Reference Point Session Dosage Given: 3 Gy
Session Number: 6

## 2023-02-28 ENCOUNTER — Ambulatory Visit
Admission: RE | Admit: 2023-02-28 | Discharge: 2023-02-28 | Disposition: A | Discharge status: Home / Self Care | Payer: Medicare HMO | Source: Ambulatory Visit | Attending: Radiation Oncology | Admitting: Radiation Oncology

## 2023-02-28 ENCOUNTER — Other Ambulatory Visit: Payer: Self-pay

## 2023-02-28 DIAGNOSIS — C7951 Secondary malignant neoplasm of bone: Secondary | ICD-10-CM | POA: Diagnosis not present

## 2023-02-28 DIAGNOSIS — Z170 Estrogen receptor positive status [ER+]: Secondary | ICD-10-CM | POA: Diagnosis not present

## 2023-02-28 DIAGNOSIS — C50911 Malignant neoplasm of unspecified site of right female breast: Secondary | ICD-10-CM | POA: Diagnosis not present

## 2023-02-28 DIAGNOSIS — Z510 Encounter for antineoplastic radiation therapy: Secondary | ICD-10-CM | POA: Diagnosis not present

## 2023-02-28 LAB — RAD ONC ARIA SESSION SUMMARY
Course Elapsed Days: 8
Plan Fractions Treated to Date: 7
Plan Prescribed Dose Per Fraction: 3 Gy
Plan Total Fractions Prescribed: 10
Plan Total Prescribed Dose: 30 Gy
Reference Point Dosage Given to Date: 21 Gy
Reference Point Session Dosage Given: 3 Gy
Session Number: 7

## 2023-03-01 ENCOUNTER — Other Ambulatory Visit: Payer: Self-pay

## 2023-03-01 ENCOUNTER — Ambulatory Visit
Admission: RE | Admit: 2023-03-01 | Discharge: 2023-03-01 | Disposition: A | Discharge status: Home / Self Care | Payer: Medicare HMO | Source: Ambulatory Visit | Attending: Radiation Oncology | Admitting: Radiation Oncology

## 2023-03-01 DIAGNOSIS — Z170 Estrogen receptor positive status [ER+]: Secondary | ICD-10-CM | POA: Diagnosis not present

## 2023-03-01 DIAGNOSIS — Z510 Encounter for antineoplastic radiation therapy: Secondary | ICD-10-CM | POA: Diagnosis not present

## 2023-03-01 DIAGNOSIS — C50911 Malignant neoplasm of unspecified site of right female breast: Secondary | ICD-10-CM | POA: Diagnosis not present

## 2023-03-01 DIAGNOSIS — C7951 Secondary malignant neoplasm of bone: Secondary | ICD-10-CM | POA: Diagnosis not present

## 2023-03-01 LAB — RAD ONC ARIA SESSION SUMMARY
Course Elapsed Days: 9
Plan Fractions Treated to Date: 8
Plan Prescribed Dose Per Fraction: 3 Gy
Plan Total Fractions Prescribed: 10
Plan Total Prescribed Dose: 30 Gy
Reference Point Dosage Given to Date: 24 Gy
Reference Point Session Dosage Given: 3 Gy
Session Number: 8

## 2023-03-02 ENCOUNTER — Other Ambulatory Visit: Payer: Self-pay

## 2023-03-02 ENCOUNTER — Ambulatory Visit
Admission: RE | Admit: 2023-03-02 | Discharge: 2023-03-02 | Disposition: A | Discharge status: Home / Self Care | Payer: Medicare HMO | Source: Ambulatory Visit | Attending: Radiation Oncology | Admitting: Radiation Oncology

## 2023-03-02 DIAGNOSIS — Z170 Estrogen receptor positive status [ER+]: Secondary | ICD-10-CM | POA: Diagnosis not present

## 2023-03-02 DIAGNOSIS — Z510 Encounter for antineoplastic radiation therapy: Secondary | ICD-10-CM | POA: Diagnosis not present

## 2023-03-02 DIAGNOSIS — C50911 Malignant neoplasm of unspecified site of right female breast: Secondary | ICD-10-CM | POA: Diagnosis not present

## 2023-03-02 DIAGNOSIS — C7951 Secondary malignant neoplasm of bone: Secondary | ICD-10-CM | POA: Diagnosis not present

## 2023-03-02 LAB — RAD ONC ARIA SESSION SUMMARY
Course Elapsed Days: 10
Plan Fractions Treated to Date: 9
Plan Prescribed Dose Per Fraction: 3 Gy
Plan Total Fractions Prescribed: 10
Plan Total Prescribed Dose: 30 Gy
Reference Point Dosage Given to Date: 27 Gy
Reference Point Session Dosage Given: 3 Gy
Session Number: 9

## 2023-03-03 ENCOUNTER — Other Ambulatory Visit: Payer: Self-pay

## 2023-03-03 ENCOUNTER — Ambulatory Visit
Admission: RE | Admit: 2023-03-03 | Discharge: 2023-03-03 | Disposition: A | Discharge status: Home / Self Care | Payer: Medicare HMO | Source: Ambulatory Visit | Attending: Radiation Oncology | Admitting: Radiation Oncology

## 2023-03-03 DIAGNOSIS — C50911 Malignant neoplasm of unspecified site of right female breast: Secondary | ICD-10-CM | POA: Diagnosis not present

## 2023-03-03 DIAGNOSIS — Z170 Estrogen receptor positive status [ER+]: Secondary | ICD-10-CM | POA: Diagnosis not present

## 2023-03-03 DIAGNOSIS — Z510 Encounter for antineoplastic radiation therapy: Secondary | ICD-10-CM | POA: Diagnosis not present

## 2023-03-03 DIAGNOSIS — C7951 Secondary malignant neoplasm of bone: Secondary | ICD-10-CM | POA: Diagnosis not present

## 2023-03-03 LAB — RAD ONC ARIA SESSION SUMMARY
Course Elapsed Days: 11
Plan Fractions Treated to Date: 10
Plan Prescribed Dose Per Fraction: 3 Gy
Plan Total Fractions Prescribed: 10
Plan Total Prescribed Dose: 30 Gy
Reference Point Dosage Given to Date: 30 Gy
Reference Point Session Dosage Given: 3 Gy
Session Number: 10

## 2023-03-06 NOTE — Radiation Completion Notes (Signed)
Patient Name: LUMEN, MCCLARD MRN: [id number] Date of Birth: 04/08/1966 Referring Physician: Oliver Barre, M.D. Date of Service: 2023-03-06 Radiation Oncologist: Arnette Schaumann, M.D. [facility]                             RADIATION ONCOLOGY END OF TREATMENT NOTE     Diagnosis: C79.51 Secondary malignant neoplasm of bone Intent: Palliative     ==========DELIVERED PLANS==========  First Treatment Date: 2023-02-20 - Last Treatment Date: 2023-03-03   Plan Name: Spine_Sacrum Site: Sacrum Technique: 3D Mode: Photon Dose Per Fraction: 3 Gy Prescribed Dose (Delivered / Prescribed): 30 Gy / 30 Gy Prescribed Fxs (Delivered / Prescribed): 10 / 10     ==========ON TREATMENT VISIT DATES========== 2023-02-21, 2023-02-28     ==========UPCOMING VISITS==========       ==========APPENDIX - ON TREATMENT VISIT NOTES==========   See weekly On Treatment Notes in Epic for details.

## 2023-03-09 ENCOUNTER — Inpatient Hospital Stay (HOSPITAL_BASED_OUTPATIENT_CLINIC_OR_DEPARTMENT_OTHER): Payer: Medicare HMO | Admitting: Hematology & Oncology

## 2023-03-09 ENCOUNTER — Encounter: Payer: Self-pay | Admitting: Hematology & Oncology

## 2023-03-09 ENCOUNTER — Inpatient Hospital Stay: Payer: Medicare HMO

## 2023-03-09 ENCOUNTER — Other Ambulatory Visit: Payer: Self-pay

## 2023-03-09 ENCOUNTER — Encounter: Payer: Self-pay | Admitting: *Deleted

## 2023-03-09 VITALS — BP 106/87 | HR 96 | Temp 98.7°F | Resp 18 | Ht 68.0 in | Wt 197.0 lb

## 2023-03-09 DIAGNOSIS — Z170 Estrogen receptor positive status [ER+]: Secondary | ICD-10-CM | POA: Diagnosis not present

## 2023-03-09 DIAGNOSIS — C50919 Malignant neoplasm of unspecified site of unspecified female breast: Secondary | ICD-10-CM

## 2023-03-09 DIAGNOSIS — Z5111 Encounter for antineoplastic chemotherapy: Secondary | ICD-10-CM | POA: Diagnosis not present

## 2023-03-09 DIAGNOSIS — Z923 Personal history of irradiation: Secondary | ICD-10-CM | POA: Diagnosis not present

## 2023-03-09 DIAGNOSIS — C7951 Secondary malignant neoplasm of bone: Secondary | ICD-10-CM

## 2023-03-09 DIAGNOSIS — D509 Iron deficiency anemia, unspecified: Secondary | ICD-10-CM | POA: Diagnosis not present

## 2023-03-09 LAB — IRON AND IRON BINDING CAPACITY (CC-WL,HP ONLY)
Iron: 87 ug/dL (ref 28–170)
Saturation Ratios: 29 % (ref 10.4–31.8)
TIBC: 297 ug/dL (ref 250–450)
UIBC: 210 ug/dL (ref 148–442)

## 2023-03-09 LAB — CMP (CANCER CENTER ONLY)
ALT: 9 U/L (ref 0–44)
AST: 10 U/L — ABNORMAL LOW (ref 15–41)
Albumin: 4.4 g/dL (ref 3.5–5.0)
Alkaline Phosphatase: 51 U/L (ref 38–126)
Anion gap: 7 (ref 5–15)
BUN: 17 mg/dL (ref 6–20)
CO2: 27 mmol/L (ref 22–32)
Calcium: 8.9 mg/dL (ref 8.9–10.3)
Chloride: 104 mmol/L (ref 98–111)
Creatinine: 0.91 mg/dL (ref 0.44–1.00)
GFR, Estimated: >60 mL/min (ref >60)
Glucose, Bld: 97 mg/dL (ref 70–99)
Potassium: 3.8 mmol/L (ref 3.5–5.1)
Sodium: 138 mmol/L (ref 135–145)
Total Bilirubin: 0.6 mg/dL (ref 0.3–1.2)
Total Protein: 6.6 g/dL (ref 6.5–8.1)

## 2023-03-09 LAB — CBC WITH DIFFERENTIAL (CANCER CENTER ONLY)
Abs Immature Granulocytes: 0.01 10*3/uL (ref 0.00–0.07)
Basophils Absolute: 0 10*3/uL (ref 0.0–0.1)
Basophils Relative: 2 %
Eosinophils Absolute: 0.1 10*3/uL (ref 0.0–0.5)
Eosinophils Relative: 3 %
HCT: 33.8 % — ABNORMAL LOW (ref 36.0–46.0)
Hemoglobin: 11.9 g/dL — ABNORMAL LOW (ref 12.0–15.0)
Immature Granulocytes: 1 %
Lymphocytes Relative: 27 %
Lymphs Abs: 0.5 10*3/uL — ABNORMAL LOW (ref 0.7–4.0)
MCH: 35.3 pg — ABNORMAL HIGH (ref 26.0–34.0)
MCHC: 35.2 g/dL (ref 30.0–36.0)
MCV: 100.3 fL — ABNORMAL HIGH (ref 80.0–100.0)
Monocytes Absolute: 0.2 10*3/uL (ref 0.1–1.0)
Monocytes Relative: 12 %
Neutro Abs: 1.1 10*3/uL — ABNORMAL LOW (ref 1.7–7.7)
Neutrophils Relative %: 55 %
Platelet Count: 118 10*3/uL — ABNORMAL LOW (ref 150–400)
RBC: 3.37 MIL/uL — ABNORMAL LOW (ref 3.87–5.11)
RDW: 14.3 % (ref 11.5–15.5)
WBC Count: 1.9 10*3/uL — ABNORMAL LOW (ref 4.0–10.5)
nRBC: 0 % (ref 0.0–0.2)

## 2023-03-09 LAB — FERRITIN: Ferritin: 106 ng/mL (ref 11–307)

## 2023-03-09 MED ORDER — HEPARIN SOD (PORK) LOCK FLUSH 100 UNIT/ML IV SOLN
500.0000 [IU] | Freq: Once | INTRAVENOUS | Status: AC
  Administered 2023-03-09: 500 [IU] via INTRAVENOUS

## 2023-03-09 MED ORDER — FULVESTRANT 250 MG/5ML IM SOSY
500.0000 mg | PREFILLED_SYRINGE | INTRAMUSCULAR | Status: DC
  Administered 2023-03-09: 500 mg via INTRAMUSCULAR

## 2023-03-09 MED ORDER — DENOSUMAB 120 MG/1.7ML ~~LOC~~ SOLN
120.0000 mg | Freq: Once | SUBCUTANEOUS | Status: AC
  Administered 2023-03-09: 120 mg via SUBCUTANEOUS
  Filled 2023-03-09: qty 1.7

## 2023-03-09 MED ORDER — SODIUM CHLORIDE 0.9% FLUSH
10.0000 mL | Freq: Once | INTRAVENOUS | Status: AC
Start: 2023-03-09 — End: 2023-03-09
  Administered 2023-03-09: 10 mL via INTRAVENOUS

## 2023-03-09 NOTE — Patient Instructions (Signed)

## 2023-03-09 NOTE — Patient Instructions (Signed)
Denosumab Injection (Oncology) What is this medication? DENOSUMAB (den oh SUE mab) prevents weakened bones caused by cancer. It may also be used to treat noncancerous bone tumors that cannot be removed by surgery. It can also be used to treat high calcium levels in the blood caused by cancer. It works by blocking a protein that causes bones to break down quickly. This slows down the release of calcium from bones, which lowers calcium levels in your blood. It also makes your bones stronger and less likely to break (fracture). This medicine may be used for other purposes; ask your health care provider or pharmacist if you have questions. COMMON BRAND NAME(S): XGEVA What should I tell my care team before I take this medication? They need to know if you have any of these conditions: Dental disease Having surgery or tooth extraction Infection Kidney disease Low levels of calcium or vitamin D in the blood Malnutrition On hemodialysis Skin conditions or sensitivity Thyroid or parathyroid disease An unusual reaction to denosumab, other medications, foods, dyes, or preservatives Pregnant or trying to get pregnant Breast-feeding How should I use this medication? This medication is for injection under the skin. It is given by your care team in a hospital or clinic setting. A special MedGuide will be given to you before each treatment. Be sure to read this information carefully each time. Talk to your care team about the use of this medication in children. While it may be prescribed for children as young as 13 years for selected conditions, precautions do apply. Overdosage: If you think you have taken too much of this medicine contact a poison control center or emergency room at once. NOTE: This medicine is only for you. Do not share this medicine with others. What if I miss a dose? Keep appointments for follow-up doses. It is important not to miss your dose. Call your care team if you are unable to  keep an appointment. What may interact with this medication? Do not take this medication with any of the following: Other medications containing denosumab This medication may also interact with the following: Medications that lower your chance of fighting infection Steroid medications, such as prednisone or cortisone This list may not describe all possible interactions. Give your health care provider a list of all the medicines, herbs, non-prescription drugs, or dietary supplements you use. Also tell them if you smoke, drink alcohol, or use illegal drugs. Some items may interact with your medicine. What should I watch for while using this medication? Your condition will be monitored carefully while you are receiving this medication. You may need blood work while taking this medication. This medication may increase your risk of getting an infection. Call your care team for advice if you get a fever, chills, sore throat, or other symptoms of a cold or flu. Do not treat yourself. Try to avoid being around people who are sick. You should make sure you get enough calcium and vitamin D while you are taking this medication, unless your care team tells you not to. Discuss the foods you eat and the vitamins you take with your care team. Some people who take this medication have severe bone, joint, or muscle pain. This medication may also increase your risk for jaw problems or a broken thigh bone. Tell your care team right away if you have severe pain in your jaw, bones, joints, or muscles. Tell your care team if you have any pain that does not go away or that gets worse. Talk   to your care team if you may be pregnant. Serious birth defects can occur if you take this medication during pregnancy and for 5 months after the last dose. You will need a negative pregnancy test before starting this medication. Contraception is recommended while taking this medication and for 5 months after the last dose. Your care team  can help you find the option that works for you. What side effects may I notice from receiving this medication? Side effects that you should report to your care team as soon as possible: Allergic reactions--skin rash, itching, hives, swelling of the face, lips, tongue, or throat Bone, joint, or muscle pain Low calcium level--muscle pain or cramps, confusion, tingling, or numbness in the hands or feet Osteonecrosis of the jaw--pain, swelling, or redness in the mouth, numbness of the jaw, poor healing after dental work, unusual discharge from the mouth, visible bones in the mouth Side effects that usually do not require medical attention (report to your care team if they continue or are bothersome): Cough Diarrhea Fatigue Headache Nausea This list may not describe all possible side effects. Call your doctor for medical advice about side effects. You may report side effects to FDA at 1-800-FDA-1088. Where should I keep my medication? This medication is given in a hospital or clinic. It will not be stored at home. NOTE: This sheet is a summary. It may not cover all possible information. If you have questions about this medicine, talk to your doctor, pharmacist, or health care provider.  2024 Elsevier/Gold Standard (2021-09-15 00:00:00) Fulvestrant Injection What is this medication? FULVESTRANT (ful VES trant) treats breast cancer. It works by blocking the hormone estrogen in breast tissue, which prevents breast cancer cells from spreading or growing. This medicine may be used for other purposes; ask your health care provider or pharmacist if you have questions. COMMON BRAND NAME(S): FASLODEX What should I tell my care team before I take this medication? They need to know if you have any of these conditions: Bleeding disorder Liver disease Low blood cell levels, such as low white cells, red cells, and platelets An unusual or allergic reaction to fulvestrant, other medications, foods, dyes, or  preservatives Pregnant or trying to get pregnant Breast-feeding How should I use this medication? This medication is injected into a muscle. It is given by your care team in a hospital or clinic setting. Talk to your care team about the use of this medication in children. Special care may be needed. Overdosage: If you think you have taken too much of this medicine contact a poison control center or emergency room at once. NOTE: This medicine is only for you. Do not share this medicine with others. What if I miss a dose? Keep appointments for follow-up doses. It is important not to miss your dose. Call your care team if you are unable to keep an appointment. What may interact with this medication? Certain medications that prevent or treat blood clots, such as warfarin, enoxaparin, dalteparin, apixaban, dabigatran, rivaroxaban This list may not describe all possible interactions. Give your health care provider a list of all the medicines, herbs, non-prescription drugs, or dietary supplements you use. Also tell them if you smoke, drink alcohol, or use illegal drugs. Some items may interact with your medicine. What should I watch for while using this medication? Your condition will be monitored carefully while you are receiving this medication. You may need blood work while taking this medication. Talk to your care team if you may be pregnant. Serious   birth defects can occur if you take this medication during pregnancy and for 1 year after the last dose. You will need a negative pregnancy test before starting this medication. Contraception is recommended while taking this medication and for 1 year after the last dose. Your care team can help you find the option that works for you. Do not breastfeed while taking this medication and for 1 year after the last dose. This medication may cause infertility. Talk to your care team if you are concerned about your fertility. What side effects may I notice from  receiving this medication? Side effects that you should report to your care team as soon as possible: Allergic reactions or angioedema--skin rash, itching or hives, swelling of the face, eyes, lips, tongue, arms, or legs, trouble swallowing or breathing Pain, tingling, or numbness in the hands or feet Side effects that usually do not require medical attention (report to your care team if they continue or are bothersome): Bone, joint, or muscle pain Constipation Headache Hot flashes Nausea Pain, redness, or irritation at injection site Unusual weakness or fatigue This list may not describe all possible side effects. Call your doctor for medical advice about side effects. You may report side effects to FDA at 1-800-FDA-1088. Where should I keep my medication? This medication is given in a hospital or clinic. It will not be stored at home. NOTE: This sheet is a summary. It may not cover all possible information. If you have questions about this medicine, talk to your doctor, pharmacist, or health care provider.  2024 Elsevier/Gold Standard (2021-09-07 00:00:00)  

## 2023-03-09 NOTE — Progress Notes (Signed)
Hematology and Oncology Follow Up Visit  Caitlin Greene [id number] 15-Feb-1966 57 y.o. 03/09/2023   Principle Diagnosis:  Metastatic breast cancer-ER positive/HER-2 negative --bone metastasis only Iron deficiency anemia  Current Therapy:   Faslodex 500 mg IM monthly --start on 04/2020 Ibrance 100 mg p.o. daily (21d on/7d off) - start on 04/2020 Xgeva 120 mg subcu every 3 months -next dose 05/2023  IV iron-Venofer given on 10/21/2022   Status post XRT for right sacral lesion -completed on 03/03/2023 -- 3000 rad     Interim History:  [name] is in for follow-up.  Unfortunately, her father did pass away.  The last time that we saw her, he was in the hospital.  I know this been hard on her.  She lost both her sister and her father probably within 3 to 4 months of each other.  Thankfully, she does have a good family that helps her.  She just completed Radiation Therapy for that her right sacral lesion.  She did well with this.  She really was not have much in the way of pain.  We do this more so to prevent any problems in the future.  I think that her last CA 27.29 was holding steady at 47.  She is eating well.  She is having no problems with nausea or vomiting.  She has had no issues with bowels or bladder.  She has had no cough or shortness of breath.  She has had no leg swelling.  There is been no rashes.  She has had no headache.  Overall, I would say that her performance status is probably ECOG 1.    Wt Readings from Last 3 Encounters:  03/09/23 197 lb (89.4 kg)  02/06/23 199 lb (90.3 kg)  01/19/23 198 lb (89.8 kg)    Medications:  Current Outpatient Medications:    acetaminophen (TYLENOL) 650 MG CR tablet, Take 650 mg by mouth every 8 (eight) hours as needed for pain., Disp: , Rfl:    calcium carbonate (OS-CAL) 600 MG tablet, Take 1 tablet (600 mg total) by mouth 2 (two) times daily., Disp: 30 tablet, Rfl: 0   Ferrous Sulfate (IRON) 325 (65 Fe) MG TABS, Take 1 tablet  (325 mg total) by mouth daily at 2 PM., Disp: 30 tablet, Rfl: 0   FLUoxetine (PROZAC) 20 MG capsule, TAKE 3 CAPSULES(60 MG) BY MOUTH DAILY, Disp: 270 capsule, Rfl: 3   fulvestrant (FASLODEX) 250 MG/5ML injection, Inject 500 mg into the muscle every 30 (thirty) days. One injection each buttock over 1-2 minutes. Warm prior to use., Disp: , Rfl:    IBRANCE 100 MG tablet, TAKE 1 TABLET DAILY ON DAYS 1 THROUGH 21 OF CYCLE, 7 DAYS OFF. REPEAT, Disp: 21 tablet, Rfl: 0   lidocaine-prilocaine (EMLA) cream, Apply 1 Application topically as needed., Disp: 30 g, Rfl: 0   LORazepam (ATIVAN) 1 MG tablet, Take 1 tablet (1 mg total) by mouth once as needed for up to 1 dose for anxiety. Take 1 tablet (1 mg total) by mouth 20 minutes before MRI scan. Please have a driver for this test as this medication has sedating effects., Disp: 1 tablet, Rfl: 0   meclizine (ANTIVERT) 12.5 MG tablet, Take 1 tablet (12.5 mg total) by mouth 3 (three) times daily as needed for dizziness., Disp: 40 tablet, Rfl: 1   oxyCODONE (OXY IR/ROXICODONE) 5 MG immediate release tablet, Take 1 tablet (5 mg total) by mouth every 8 (eight) hours as needed for severe pain., Disp: 50 tablet,  Rfl: 0   pantoprazole (PROTONIX) 40 MG tablet, TAKE 1 TABLET(40 MG) BY MOUTH TWICE DAILY BEFORE A MEAL, Disp: 60 tablet, Rfl: 5   vitamin B-12 (CYANOCOBALAMIN) 1000 MCG tablet, Take 1 tablet (1,000 mcg total) by mouth daily., Disp: 30 tablet, Rfl: 0   Wheat Dextrin (BENEFIBER DRINK MIX PO), Take by mouth daily at 6 (six) AM., Disp: , Rfl:  No current facility-administered medications for this visit.  Facility-Administered Medications Ordered in Other Visits:    fulvestrant (FASLODEX) injection 500 mg, 500 mg, Intramuscular, Q30 days, Josph Macho, MD, 500 mg at 03/09/23 6962  Allergies:  Allergies  Allergen Reactions   Amoxicillin-Pot Clavulanate Hives and Itching   Iron Anaphylaxis and Other (See Comments)    Patient develops hypotension, resp distress  after infusion of feraheme.    Penicillins Hives, Itching and Other (See Comments)   Codeine Nausea And Vomiting    Needs pre-meds     Past Medical History, Surgical history, Social history, and Family History were reviewed and updated.  Review of Systems: Review of Systems  Constitutional:  Negative for appetite change.  HENT:  Negative.    Eyes: Negative.   Respiratory: Negative.    Cardiovascular: Negative.   Gastrointestinal:  Negative for abdominal pain and nausea.  Endocrine: Negative.   Genitourinary: Negative.    Musculoskeletal:  Positive for arthralgias and back pain.  Skin: Negative.   Neurological:  Negative for dizziness.  Hematological: Negative.   Psychiatric/Behavioral: Negative.      Physical Exam:  height is 5\' 8"  (1.727 m) and weight is 197 lb (89.4 kg). Her oral temperature is 98.7 F (37.1 C). Her blood pressure is 106/87 and her pulse is 96. Her respiration is 18 and oxygen saturation is 97%.   Wt Readings from Last 3 Encounters:  03/09/23 197 lb (89.4 kg)  02/06/23 199 lb (90.3 kg)  01/19/23 198 lb (89.8 kg)    Physical Exam Vitals reviewed.  HENT:     Head: Normocephalic and atraumatic.  Eyes:     Pupils: Pupils are equal, round, and reactive to light.  Cardiovascular:     Rate and Rhythm: Normal rate and regular rhythm.     Heart sounds: Normal heart sounds.  Pulmonary:     Effort: Pulmonary effort is normal.     Breath sounds: Normal breath sounds.  Abdominal:     General: Bowel sounds are normal.     Palpations: Abdomen is soft.  Musculoskeletal:        General: No tenderness or deformity. Normal range of motion.     Cervical back: Normal range of motion.  Lymphadenopathy:     Cervical: No cervical adenopathy.  Skin:    General: Skin is warm and dry.     Findings: No erythema or rash.  Neurological:     Mental Status: She is alert and oriented to person, place, and time.  Psychiatric:        Behavior: Behavior normal.         Thought Content: Thought content normal.        Judgment: Judgment normal.      Lab Results  Component Value Date   WBC 1.9 (L) 03/09/2023   HGB 11.9 (L) 03/09/2023   HCT 33.8 (L) 03/09/2023   MCV 100.3 (H) 03/09/2023   PLT 118 (L) 03/09/2023     Chemistry      Component Value Date/Time   NA 138 03/09/2023 0840   K 3.8 03/09/2023 0840  CL 104 03/09/2023 0840   CO2 27 03/09/2023 0840   BUN 17 03/09/2023 0840   CREATININE 0.91 03/09/2023 0840   CREATININE 0.88 10/22/2019 1246      Component Value Date/Time   CALCIUM 8.9 03/09/2023 0840   ALKPHOS 51 03/09/2023 0840   AST 10 (L) 03/09/2023 0840   ALT 9 03/09/2023 0840   BILITOT 0.6 03/09/2023 0840       Impression and Plan: Ms. Kocian is a very charming 57 year old postmenopausal female with metastatic breast cancer.  We actually had seen her many years ago with a carcinoma in situ.  So far, she really done nicely on antiestrogen therapy.  She will go home and get her Rivka Barbara today.  I am sorry that her father passed away.  I know that she did a whole lot to try to help him as much as possible..  She is due for scans.  Will set up further CT scan and bone scan.  We will do this in a couple weeks.  We will plan to see her back after Thanksgiving.  I would like her to be able to enjoy things giving with her family.

## 2023-03-10 LAB — CANCER ANTIGEN 27.29: CA 27.29: 54.8 U/mL — ABNORMAL HIGH (ref 0.0–38.6)

## 2023-03-15 ENCOUNTER — Other Ambulatory Visit: Payer: Self-pay | Admitting: Hematology & Oncology

## 2023-03-15 ENCOUNTER — Other Ambulatory Visit (HOSPITAL_COMMUNITY): Payer: Self-pay

## 2023-03-15 ENCOUNTER — Other Ambulatory Visit: Payer: Self-pay

## 2023-03-15 ENCOUNTER — Telehealth: Payer: Self-pay

## 2023-03-15 MED ORDER — IBRANCE 100 MG PO TABS
ORAL_TABLET | ORAL | 0 refills | Status: DC
  Filled 2023-03-15: qty 21, 28d supply, fill #0

## 2023-03-15 NOTE — Telephone Encounter (Signed)
Patient informed that her [employer] documents had been completed and faxed back to the company. Fax confirmation received. No further concerns at this time.

## 2023-03-15 NOTE — Progress Notes (Signed)
Specialty Pharmacy Refill Coordination Note  Caitlin Greene is a 57 y.o. female contacted today regarding refills of specialty medication(s) Palbociclib   Patient requested Delivery   Delivery date: 03/17/23   Verified address: [address]  [place] [address]   Medication will be filled on 03/16/23.     Pending refill request.

## 2023-03-22 ENCOUNTER — Encounter (HOSPITAL_COMMUNITY)
Admission: RE | Admit: 2023-03-22 | Discharge: 2023-03-22 | Disposition: A | Discharge status: Home / Self Care | Payer: Medicare HMO | Source: Ambulatory Visit | Attending: Hematology & Oncology | Admitting: Hematology & Oncology

## 2023-03-22 ENCOUNTER — Ambulatory Visit (HOSPITAL_COMMUNITY)
Admission: RE | Admit: 2023-03-22 | Discharge: 2023-03-22 | Disposition: A | Discharge status: Home / Self Care | Payer: Medicare HMO | Source: Ambulatory Visit | Attending: Hematology & Oncology | Admitting: Hematology & Oncology

## 2023-03-22 ENCOUNTER — Encounter (HOSPITAL_COMMUNITY): Payer: Self-pay

## 2023-03-22 DIAGNOSIS — C7951 Secondary malignant neoplasm of bone: Secondary | ICD-10-CM

## 2023-03-22 DIAGNOSIS — N838 Other noninflammatory disorders of ovary, fallopian tube and broad ligament: Secondary | ICD-10-CM | POA: Diagnosis not present

## 2023-03-22 DIAGNOSIS — K449 Diaphragmatic hernia without obstruction or gangrene: Secondary | ICD-10-CM | POA: Diagnosis not present

## 2023-03-22 DIAGNOSIS — C50919 Malignant neoplasm of unspecified site of unspecified female breast: Secondary | ICD-10-CM | POA: Diagnosis not present

## 2023-03-22 MED ORDER — HEPARIN SOD (PORK) LOCK FLUSH 100 UNIT/ML IV SOLN
500.0000 [IU] | Freq: Once | INTRAVENOUS | Status: AC
  Administered 2023-03-22: 500 [IU] via INTRAVENOUS

## 2023-03-22 MED ORDER — HEPARIN SOD (PORK) LOCK FLUSH 100 UNIT/ML IV SOLN
INTRAVENOUS | Status: AC
  Filled 2023-03-22: qty 5

## 2023-03-22 MED ORDER — IOHEXOL 300 MG/ML  SOLN
100.0000 mL | Freq: Once | INTRAMUSCULAR | Status: AC | PRN
  Administered 2023-03-22: 100 mL via INTRAVENOUS

## 2023-03-31 ENCOUNTER — Encounter: Payer: Self-pay | Admitting: *Deleted

## 2023-04-04 ENCOUNTER — Encounter: Payer: Self-pay | Admitting: Radiation Oncology

## 2023-04-05 ENCOUNTER — Other Ambulatory Visit: Payer: Self-pay

## 2023-04-05 ENCOUNTER — Inpatient Hospital Stay (HOSPITAL_BASED_OUTPATIENT_CLINIC_OR_DEPARTMENT_OTHER): Payer: Medicare HMO | Admitting: Hematology & Oncology

## 2023-04-05 ENCOUNTER — Inpatient Hospital Stay: Payer: Medicare HMO

## 2023-04-05 ENCOUNTER — Encounter: Payer: Self-pay | Admitting: Hematology & Oncology

## 2023-04-05 ENCOUNTER — Other Ambulatory Visit: Payer: Self-pay | Admitting: Hematology & Oncology

## 2023-04-05 ENCOUNTER — Inpatient Hospital Stay: Payer: Medicare HMO | Attending: Hematology & Oncology

## 2023-04-05 VITALS — BP 114/78 | HR 85 | Temp 97.7°F | Resp 16 | Ht 68.0 in | Wt 198.0 lb

## 2023-04-05 DIAGNOSIS — C50919 Malignant neoplasm of unspecified site of unspecified female breast: Secondary | ICD-10-CM | POA: Insufficient documentation

## 2023-04-05 DIAGNOSIS — Z95828 Presence of other vascular implants and grafts: Secondary | ICD-10-CM

## 2023-04-05 DIAGNOSIS — C7951 Secondary malignant neoplasm of bone: Secondary | ICD-10-CM | POA: Insufficient documentation

## 2023-04-05 DIAGNOSIS — Z79899 Other long term (current) drug therapy: Secondary | ICD-10-CM | POA: Diagnosis not present

## 2023-04-05 DIAGNOSIS — C50011 Malignant neoplasm of nipple and areola, right female breast: Secondary | ICD-10-CM | POA: Diagnosis not present

## 2023-04-05 DIAGNOSIS — D509 Iron deficiency anemia, unspecified: Secondary | ICD-10-CM | POA: Insufficient documentation

## 2023-04-05 DIAGNOSIS — Z5111 Encounter for antineoplastic chemotherapy: Secondary | ICD-10-CM | POA: Insufficient documentation

## 2023-04-05 DIAGNOSIS — Z170 Estrogen receptor positive status [ER+]: Secondary | ICD-10-CM | POA: Insufficient documentation

## 2023-04-05 LAB — CBC WITH DIFFERENTIAL (CANCER CENTER ONLY)
Abs Immature Granulocytes: 0.01 10*3/uL (ref 0.00–0.07)
Basophils Absolute: 0 10*3/uL (ref 0.0–0.1)
Basophils Relative: 1 %
Eosinophils Absolute: 0.1 10*3/uL (ref 0.0–0.5)
Eosinophils Relative: 3 %
HCT: 34.7 % — ABNORMAL LOW (ref 36.0–46.0)
Hemoglobin: 12.1 g/dL (ref 12.0–15.0)
Immature Granulocytes: 0 %
Lymphocytes Relative: 22 %
Lymphs Abs: 0.5 10*3/uL — ABNORMAL LOW (ref 0.7–4.0)
MCH: 34.9 pg — ABNORMAL HIGH (ref 26.0–34.0)
MCHC: 34.9 g/dL (ref 30.0–36.0)
MCV: 100 fL (ref 80.0–100.0)
Monocytes Absolute: 0.2 10*3/uL (ref 0.1–1.0)
Monocytes Relative: 7 %
Neutro Abs: 1.6 10*3/uL — ABNORMAL LOW (ref 1.7–7.7)
Neutrophils Relative %: 67 %
Platelet Count: 196 10*3/uL (ref 150–400)
RBC: 3.47 MIL/uL — ABNORMAL LOW (ref 3.87–5.11)
RDW: 13.9 % (ref 11.5–15.5)
WBC Count: 2.4 10*3/uL — ABNORMAL LOW (ref 4.0–10.5)
nRBC: 0 % (ref 0.0–0.2)

## 2023-04-05 LAB — CMP (CANCER CENTER ONLY)
ALT: 12 U/L (ref 0–44)
AST: 12 U/L — ABNORMAL LOW (ref 15–41)
Albumin: 4 g/dL (ref 3.5–5.0)
Alkaline Phosphatase: 54 U/L (ref 38–126)
Anion gap: 7 (ref 5–15)
BUN: 14 mg/dL (ref 6–20)
CO2: 26 mmol/L (ref 22–32)
Calcium: 8.8 mg/dL — ABNORMAL LOW (ref 8.9–10.3)
Chloride: 106 mmol/L (ref 98–111)
Creatinine: 0.91 mg/dL (ref 0.44–1.00)
GFR, Estimated: >60 mL/min (ref >60)
Glucose, Bld: 91 mg/dL (ref 70–99)
Potassium: 3.7 mmol/L (ref 3.5–5.1)
Sodium: 139 mmol/L (ref 135–145)
Total Bilirubin: 0.5 mg/dL (ref <1.2)
Total Protein: 6.5 g/dL (ref 6.5–8.1)

## 2023-04-05 LAB — IRON AND IRON BINDING CAPACITY (CC-WL,HP ONLY)
Iron: 111 ug/dL (ref 28–170)
Saturation Ratios: 37 % — ABNORMAL HIGH (ref 10.4–31.8)
TIBC: 298 ug/dL (ref 250–450)
UIBC: 187 ug/dL (ref 148–442)

## 2023-04-05 LAB — FERRITIN: Ferritin: 141 ng/mL (ref 11–307)

## 2023-04-05 MED ORDER — HEPARIN SOD (PORK) LOCK FLUSH 100 UNIT/ML IV SOLN
500.0000 [IU] | Freq: Once | INTRAVENOUS | Status: AC
  Administered 2023-04-05: 500 [IU] via INTRAVENOUS

## 2023-04-05 MED ORDER — IBRANCE 100 MG PO TABS
ORAL_TABLET | ORAL | 0 refills | Status: DC
  Filled 2023-04-05: qty 21, 28d supply, fill #0

## 2023-04-05 MED ORDER — FULVESTRANT 250 MG/5ML IM SOSY
500.0000 mg | PREFILLED_SYRINGE | Freq: Once | INTRAMUSCULAR | Status: AC
  Administered 2023-04-05: 500 mg via INTRAMUSCULAR
  Filled 2023-04-05: qty 10

## 2023-04-05 MED ORDER — SODIUM CHLORIDE 0.9% FLUSH
10.0000 mL | INTRAVENOUS | Status: DC | PRN
  Administered 2023-04-05: 10 mL via INTRAVENOUS

## 2023-04-05 NOTE — Progress Notes (Signed)
Specialty Pharmacy Refill Coordination Note  Caitlin Greene is a 57 y.o. female contacted today regarding refills of specialty medication(s) Palbociclib   Patient requested No data recorded  Delivery date: 04/10/23   Verified address: [address]  [place] [address]   Medication will be filled on 04/07/23.  Refill request pending.

## 2023-04-05 NOTE — Patient Instructions (Signed)

## 2023-04-05 NOTE — Progress Notes (Signed)
Hematology and Oncology Follow Up Visit  Caitlin Greene [id number] 04-30-1966 57 y.o. 04/05/2023   Principle Diagnosis:  Metastatic breast cancer-ER positive/Caitlin Greene-2 negative --bone metastasis only Iron deficiency anemia  Current Therapy:   Faslodex 500 mg IM monthly --start on 04/2020 Ibrance 100 mg p.o. daily (21d on/7d off) - start on 04/2020 Xgeva 120 mg subcu every 3 months -next dose 05/2023  IV iron-Venofer given on 10/21/2022   Status post XRT for right sacral lesion -completed on 03/03/2023 -- 3000 rad     Interim History:  Caitlin Greene is in for follow-up.  She is doing pretty well.  She really has had no specific complaints.  She will be with some friends for Thanksgiving tomorrow.  She did have a CT scan and a bone scan that was done recently.  Everything looked very stable.  Very happy about this.  Caitlin Greene last CA 27.29 was up a little bit at 55.  This is tends to fluctuate for Caitlin Greene.  She is really not complaining of any pain.  There is no change in bowel or bladder habits.  She has had no cough or shortness of breath.  She has had no headache.  There is been no nausea or vomiting.  She has had no fever.  There is been no leg swelling..  She is doing well on the [place].  Currently, I would have to say that Caitlin Greene performance status is probably ECOG 1.     Wt Readings from Last 3 Encounters:  04/05/23 198 lb (89.8 kg)  03/09/23 197 lb (89.4 kg)  02/06/23 199 lb (90.3 kg)    Medications:  Current Outpatient Medications:    acetaminophen (TYLENOL) 650 MG CR tablet, Take 650 mg by mouth every 8 (eight) hours as needed for pain., Disp: , Rfl:    calcium carbonate (OS-CAL) 600 MG tablet, Take 1 tablet (600 mg total) by mouth 2 (two) times daily., Disp: 30 tablet, Rfl: 0   Ferrous Sulfate (IRON) 325 (65 Fe) MG TABS, Take 1 tablet (325 mg total) by mouth daily at 2 PM., Disp: 30 tablet, Rfl: 0   FLUoxetine (PROZAC) 20 MG capsule, TAKE 3 CAPSULES(60 MG) BY MOUTH DAILY, Disp: 270  capsule, Rfl: 3   fulvestrant (FASLODEX) 250 MG/5ML injection, Inject 500 mg into the muscle every 30 (thirty) days. One injection each buttock over 1-2 minutes. Warm prior to use., Disp: , Rfl:    IBRANCE 100 MG tablet, TAKE 1 TABLET DAILY ON DAYS 1 THROUGH 21 OF CYCLE, 7 DAYS OFF. REPEAT, Disp: 21 tablet, Rfl: 0   lidocaine-prilocaine (EMLA) cream, Apply 1 Application topically as needed., Disp: 30 g, Rfl: 0   LORazepam (ATIVAN) 1 MG tablet, Take 1 tablet (1 mg total) by mouth once as needed for up to 1 dose for anxiety. Take 1 tablet (1 mg total) by mouth 20 minutes before MRI scan. Please have a driver for this test as this medication has sedating effects., Disp: 1 tablet, Rfl: 0   meclizine (ANTIVERT) 12.5 MG tablet, Take 1 tablet (12.5 mg total) by mouth 3 (three) times daily as needed for dizziness., Disp: 40 tablet, Rfl: 1   oxyCODONE (OXY IR/ROXICODONE) 5 MG immediate release tablet, Take 1 tablet (5 mg total) by mouth every 8 (eight) hours as needed for severe pain., Disp: 50 tablet, Rfl: 0   pantoprazole (PROTONIX) 40 MG tablet, TAKE 1 TABLET(40 MG) BY MOUTH TWICE DAILY BEFORE A MEAL, Disp: 60 tablet, Rfl: 5   vitamin B-12 (CYANOCOBALAMIN) 1000  MCG tablet, Take 1 tablet (1,000 mcg total) by mouth daily., Disp: 30 tablet, Rfl: 0   Wheat Dextrin (BENEFIBER DRINK MIX PO), Take by mouth daily at 6 (six) AM., Disp: , Rfl:   Allergies:  Allergies  Allergen Reactions   Amoxicillin-Pot Clavulanate Hives and Itching   Iron Anaphylaxis and Other (See Comments)    Patient develops hypotension, resp distress after infusion of feraheme.    Penicillins Hives, Itching and Other (See Comments)   Codeine Nausea And Vomiting    Needs pre-meds     Past Medical History, Surgical history, Social history, and Family History were reviewed and updated.  Review of Systems: Review of Systems  Constitutional:  Negative for appetite change.  HENT:  Negative.    Eyes: Negative.   Respiratory: Negative.     Cardiovascular: Negative.   Gastrointestinal:  Negative for abdominal pain and nausea.  Endocrine: Negative.   Genitourinary: Negative.    Musculoskeletal:  Positive for arthralgias and back pain.  Skin: Negative.   Neurological:  Negative for dizziness.  Hematological: Negative.   Psychiatric/Behavioral: Negative.      Physical Exam:  height is 5\' 8"  (1.727 m) and weight is 198 lb (89.8 kg). Caitlin Greene oral temperature is 97.7 F (36.5 C). Caitlin Greene blood pressure is 114/78 and Caitlin Greene pulse is 85. Caitlin Greene respiration is 16 and oxygen saturation is 96%.   Wt Readings from Last 3 Encounters:  04/05/23 198 lb (89.8 kg)  03/09/23 197 lb (89.4 kg)  02/06/23 199 lb (90.3 kg)    Physical Exam Vitals reviewed.  HENT:     Head: Normocephalic and atraumatic.  Eyes:     Pupils: Pupils are equal, round, and reactive to light.  Cardiovascular:     Rate and Rhythm: Normal rate and regular rhythm.     Heart sounds: Normal heart sounds.  Pulmonary:     Effort: Pulmonary effort is normal.     Breath sounds: Normal breath sounds.  Abdominal:     General: Bowel sounds are normal.     Palpations: Abdomen is soft.  Musculoskeletal:        General: No tenderness or deformity. Normal range of motion.     Cervical back: Normal range of motion.  Lymphadenopathy:     Cervical: No cervical adenopathy.  Skin:    General: Skin is warm and dry.     Findings: No erythema or rash.  Neurological:     Mental Status: She is alert and oriented to person, place, and time.  Psychiatric:        Behavior: Behavior normal.        Thought Content: Thought content normal.        Judgment: Judgment normal.      Lab Results  Component Value Date   WBC 2.4 (L) 04/05/2023   HGB 12.1 04/05/2023   HCT 34.7 (L) 04/05/2023   MCV 100.0 04/05/2023   PLT 196 04/05/2023     Chemistry      Component Value Date/Time   NA 139 04/05/2023 0850   K 3.7 04/05/2023 0850   CL 106 04/05/2023 0850   CO2 26 04/05/2023 0850   BUN  14 04/05/2023 0850   CREATININE 0.91 04/05/2023 0850   CREATININE 0.88 10/22/2019 1246      Component Value Date/Time   CALCIUM 8.8 (L) 04/05/2023 0850   ALKPHOS 54 04/05/2023 0850   AST 12 (L) 04/05/2023 0850   ALT 12 04/05/2023 0850   BILITOT 0.5 04/05/2023 0850  Impression and Plan: Ms. Shinder is a very charming 57 year old postmenopausal female with metastatic breast cancer.  We actually had seen Caitlin Greene many years ago with a carcinoma in situ.  So far, everything is holding pretty steady with respect to Caitlin Greene breast cancer.  I am very happy about this.  We have now been going on 3 years with treatment.  Will plan to get Caitlin Greene back in 1 month.

## 2023-04-06 LAB — CANCER ANTIGEN 27.29: CA 27.29: 55.7 U/mL — ABNORMAL HIGH (ref 0.0–38.6)

## 2023-04-07 ENCOUNTER — Other Ambulatory Visit: Payer: Self-pay

## 2023-04-09 NOTE — Progress Notes (Signed)
  Radiation Oncology         [phone] ________________________________  Name: Caitlin Greene MRN: [id number]  Date: 04/10/2023  DOB: 1966/04/06  End of Treatment Note  Diagnosis: The encounter diagnosis was Metastatic cancer to bone [facility]).   History of ductal carcinoma in situ diagnosed 30 years ago which was treated with right mastectomy and lymph node dissection at [facility].    Bony metastatic adenocarcinoma of multiple sites (from breast cancer primary) diagnosed in 2021; s/p chemotherapy and currently on antiestrogens. Evidence of several new possible sites of osseous metastatic disease in the left iliac bone and T7 (July 2024). Now with evidence of new osseous metastatic disease to the right sacral ala (August 2024)     Indication for treatment: Palliative        Radiation treatment dates: First Treatment Date: 2023-02-20 - Last Treatment Date: 2023-03-03   Site/Dose/Technique/Mode:   Site: Sacrum Technique: 3D Mode: Photon Dose Per Fraction: 3 Gy Prescribed Dose (Delivered / Prescribed): 30 Gy / 30 Gy Prescribed Fxs (Delivered / Prescribed): 10 / 10  Narrative: The patient tolerated radiation treatment relatively well. During her final weekly treatment check on 02/28/23, the patient endorsed some fatigue but otherwise denied any concerns related to treatment. She achieved improvement in her pain along the sacral area with treatment.   Plan: The patient has completed radiation treatment. The patient will return to radiation oncology clinic for routine followup in one month. I advised them to call or return sooner if they have any questions or concerns related to their recovery or treatment.  -----------------------------------  Billie Lade, PhD, MD  This document serves as a record of services personally performed by Antony Blackbird, MD. It was created on his behalf by Neena Rhymes, a trained medical scribe. The creation of this record is based on the scribe's personal  observations and the provider's statements to them. This document has been checked and approved by the attending provider.

## 2023-04-09 NOTE — Progress Notes (Signed)
Radiation Oncology         [phone]) [phone] ________________________________  Name: Caitlin Greene MRN: [id number]  Date: 04/10/2023  DOB: 02-10-1966  Follow-Up Visit Note  CC: Corwin Levins, MD  Corwin Levins, MD  No diagnosis found.  Diagnosis: The encounter diagnosis was Metastatic cancer to bone [facility]).   History of ductal carcinoma in situ diagnosed 30 years ago which was treated with right mastectomy and lymph node dissection at [facility].    Bony metastatic adenocarcinoma of multiple sites (from breast cancer primary) diagnosed in 2021; s/p chemotherapy and currently on antiestrogens. Evidence of several new possible sites of osseous metastatic disease in the left iliac bone and T7 (July 2024). Now with evidence of new osseous metastatic disease to the right sacral ala (August 2024)     Interval Since Last Radiation: 1 month and 7 days   Indication for treatment: Palliative         Radiation treatment dates: First Treatment Date: 2023-02-20 - Last Treatment Date: 2023-03-03    Site/Dose/Technique/Mode:    Site: Sacrum Technique: 3D Mode: Photon Dose Per Fraction: 3 Gy Prescribed Dose (Delivered / Prescribed): 30 Gy / 30 Gy Prescribed Fxs (Delivered / Prescribed): 10 / 10  Narrative:  The patient returns today for routine follow-up. She tolerated radiation treatment relatively well. During her final weekly treatment check on 02/28/23, the patient endorsed some fatigue but otherwise denied any concerns related to treatment. She achieved improvement in her pain along the sacral area with treatment.   She has continued to follow with Dr. Myna Hidalgo and continues to tolerate antiestrogen therapy well. She also continues to tolerate Eva well.   Pertinent imaging performed in the interval includes a CT CAP on 03/22/23 which showed the known multifocal sclerotic bone metastases as well as an increase in size and sclerotic appearance of the single lesion in the upper right hemi sacrum. CT  otherwise showed no developing new mass lesions, fluid collections, or lymph node enlargement. Other notable findings included a stable small left-sided ovarian cystic lesion.     She also presented for a whole body bone scan on 03/22/23 which showed stability of the known osseous metastatic disease. A slight increase in uptake along the upper right hemi sacral lesion was also redemonstrated.       ***                            Allergies:  is allergic to amoxicillin-pot clavulanate, iron, penicillins, and codeine.  Meds: Current Outpatient Medications  Medication Sig Dispense Refill   acetaminophen (TYLENOL) 650 MG CR tablet Take 650 mg by mouth every 8 (eight) hours as needed for pain.     calcium carbonate (OS-CAL) 600 MG tablet Take 1 tablet (600 mg total) by mouth 2 (two) times daily. 30 tablet 0   Ferrous Sulfate (IRON) 325 (65 Fe) MG TABS Take 1 tablet (325 mg total) by mouth daily at 2 PM. 30 tablet 0   FLUoxetine (PROZAC) 20 MG capsule TAKE 3 CAPSULES(60 MG) BY MOUTH DAILY 270 capsule 3   fulvestrant (FASLODEX) 250 MG/5ML injection Inject 500 mg into the muscle every 30 (thirty) days. One injection each buttock over 1-2 minutes. Warm prior to use.     IBRANCE 100 MG tablet TAKE 1 TABLET DAILY ON DAYS 1 THROUGH 21 OF CYCLE, 7 DAYS OFF. REPEAT 21 tablet 0   lidocaine-prilocaine (EMLA) cream Apply 1 Application topically as needed. 30 g  0   LORazepam (ATIVAN) 1 MG tablet Take 1 tablet (1 mg total) by mouth once as needed for up to 1 dose for anxiety. Take 1 tablet (1 mg total) by mouth 20 minutes before MRI scan. Please have a driver for this test as this medication has sedating effects. 1 tablet 0   meclizine (ANTIVERT) 12.5 MG tablet Take 1 tablet (12.5 mg total) by mouth 3 (three) times daily as needed for dizziness. 40 tablet 1   oxyCODONE (OXY IR/ROXICODONE) 5 MG immediate release tablet Take 1 tablet (5 mg total) by mouth every 8 (eight) hours as needed for severe pain. 50 tablet 0    pantoprazole (PROTONIX) 40 MG tablet TAKE 1 TABLET(40 MG) BY MOUTH TWICE DAILY BEFORE A MEAL 60 tablet 5   vitamin B-12 (CYANOCOBALAMIN) 1000 MCG tablet Take 1 tablet (1,000 mcg total) by mouth daily. 30 tablet 0   Wheat Dextrin (BENEFIBER DRINK MIX PO) Take by mouth daily at 6 (six) AM.     No current facility-administered medications for this encounter.    Physical Findings: The patient is in no acute distress. Patient is alert and oriented.  vitals were not taken for this visit. .  No significant changes. Lungs are clear to auscultation bilaterally. Heart has regular rate and rhythm. No palpable cervical, supraclavicular, or axillary adenopathy. Abdomen soft, non-tender, normal bowel sounds.   Lab Findings: Lab Results  Component Value Date   WBC 2.4 (L) 04/05/2023   HGB 12.1 04/05/2023   HCT 34.7 (L) 04/05/2023   MCV 100.0 04/05/2023   PLT 196 04/05/2023    Radiographic Findings: CT CHEST ABDOMEN PELVIS W CONTRAST  Result Date: 03/31/2023 CLINICAL DATA:  Stage IV invasive breast cancer. Prior right mastectomy. XRT complete. * Tracking Code: BO * EXAM: CT CHEST, ABDOMEN, AND PELVIS WITH CONTRAST TECHNIQUE: Multidetector CT imaging of the chest, abdomen and pelvis was performed following the standard protocol during bolus administration of intravenous contrast. RADIATION DOSE REDUCTION: This exam was performed according to the departmental dose-optimization program which includes automated exposure control, adjustment of the mA and/or kV according to patient size and/or use of iterative reconstruction technique. CONTRAST:  OMNIPAQUE IOHEXOL 300 MG/ML  SOLN COMPARISON:  CT 11/21/2022. FINDINGS: CT CHEST FINDINGS Cardiovascular: Left upper chest port in place. The port is accessed. Tip seen as far as the central SVC. Heart is nonenlarged. No pericardial effusion. Pulsation artifact along the ascending aorta. The thoracic aorta overall has a normal course and caliber.  Mediastinum/Nodes: Large hiatal hernia. Preserved thyroid gland. No specific abnormal lymph node enlargement identified including axillary regions, hilum, mediastinum, retrocrural, internal mammary or supraclavicular change. Surgical clips in the right axillary region. Lungs/Pleura: Stable 3 mm nodule anterior right upper lobe on series 6, image 63. Similar stable focus image 85 in the middle lobe. Few other tiny areas are also similar. No new dominant lung mass. No consolidation or pneumothorax. Compressive atelectasis seen in the left lung base. Small fat containing likely congenital hernia along the posterior lower right hemithorax. Musculoskeletal: Bilateral breast implants are identified. The right implant has a atypical contour. Please correlate for implant rupture. Unchanged from previous. Once again there are extensive areas of bony sclerosis identified along the thoracic spine. Also findings along the sternum and ribs. Distribution is similar to previous. There is a associated kyphosis with severe compression of the T10 level with displacement of the posterior wall towards the central canal with stenosis. Augmentation cement noted at this level. Appearance is similar. There  also augmentation cement at the sclerotic level of T6. CT ABDOMEN PELVIS FINDINGS Hepatobiliary: Fatty liver infiltration. Patent portal vein. Stones in the gallbladder. Pancreas: Unremarkable. No pancreatic ductal dilatation or surrounding inflammatory changes. Spleen: Normal in size without focal abnormality. Adrenals/Urinary Tract: Right adrenal gland is preserved. Stable left adrenal nodule measuring 13 mm. No enhancing renal mass or collecting system dilatation. Ureters have normal course and caliber extending down to the urinary bladder. Bladder is contracted. Small urachal remnant. Stomach/Bowel: On this non oral contrast exam, the stomach and small bowel are nondilated. Again large hiatal hernia identified. Large bowel has a  normal course and caliber with diffuse colonic stool. Left-sided colonic diverticulosis. Few diverticula along the right side as well. Normal appendix seen inferior to the cecum in the right lower quadrant. Vascular/Lymphatic: Aortic atherosclerosis. No enlarged abdominal or pelvic lymph nodes. Reproductive: Uterus is present. Stable cystic area in the left adnexa, likely ovarian measuring 18 mm. Other: No free air or free fluid. Tiny umbilical fat containing hernia. Small fat containing inguinal hernias. Musculoskeletal: Multifocal areas of sclerosis along the spine and pelvis consistent with known osseous metastatic disease. Extent and distribution is similar. There is increased size and sclerosis of one lesion in the upper right hemi sacrum on series 2, image 101. Augmentation cement again seen at L2. Schmorl's node compression of the level. Moderate degenerative changes along the lumbar spine with areas of stenosis particularly at L4-5. IMPRESSION: Multifocal sclerotic bone metastases again identified as above. There is one lesion in the upper right hemi sacrum which appears slightly larger and more sclerotic today. No developing new mass lesion, fluid collection or lymph node enlargement. Ancillary findings of once again large hiatal hernia, colonic diverticulosis, left adrenal nodule and gallstones. Stable small left-sided ovarian cystic lesion. Bilateral breast implants. The right once again has a similar appearance to the previous with findings of potential implant rupture. Electronically Signed   By: Karen Kays M.D.   On: 03/31/2023 16:04   NM Bone Scan Whole Body  Result Date: 03/31/2023 CLINICAL DATA:  Metastatic breast cancer. EXAM: NUCLEAR MEDICINE WHOLE BODY BONE SCAN TECHNIQUE: Whole body anterior and posterior images were obtained approximately 3 hours after intravenous injection of radiopharmaceutical. RADIOPHARMACEUTICALS:  21.2 mCi Technetium-47m MDP IV COMPARISON:  Bone scan 11/21/2022  FINDINGS: Physiologic distribution of tracer along the kidneys and bladder. There are areas of degenerative uptake along the shoulders, knees, ankles. There continues to be areas of abnormal uptake consistent with metastatic disease involving the thoracic and lumbar spine as well as the upper sacrum. No new areas identified. Level of uptake is similar except for slightly more intense uptake along the upper right hemi sacral lesion. IMPRESSION: Stable distribution of osseous metastatic disease by scintigraphy. Slightly more uptake along the upper right hemi sacral lesion. Electronically Signed   By: Karen Kays M.D.   On: 03/31/2023 15:49    Impression: History of ductal carcinoma in situ diagnosed 30 years ago which was treated with right mastectomy and lymph node dissection at [facility].    Bony metastatic adenocarcinoma of multiple sites (from breast cancer primary) diagnosed in 2021; s/p chemotherapy and currently on antiestrogens. Evidence of several new possible sites of osseous metastatic disease in the left iliac bone and T7 (July 2024). Now with evidence of new osseous metastatic disease to the right sacral ala (August 2024)     The patient is recovering from the effects of radiation.  ***  Plan:  ***   *** minutes of total  time was spent for this patient encounter, including preparation, face-to-face counseling with the patient and coordination of care, physical exam, and documentation of the encounter. ____________________________________  Billie Lade, PhD, MD  This document serves as a record of services personally performed by Antony Blackbird, MD. It was created on his behalf by Neena Rhymes, a trained medical scribe. The creation of this record is based on the scribe's personal observations and the provider's statements to them. This document has been checked and approved by the attending provider.

## 2023-04-10 ENCOUNTER — Ambulatory Visit
Admission: RE | Admit: 2023-04-10 | Discharge: 2023-04-10 | Disposition: A | Discharge status: Home / Self Care | Payer: Medicare HMO | Source: Ambulatory Visit | Attending: Radiation Oncology | Admitting: Radiation Oncology

## 2023-04-10 ENCOUNTER — Encounter: Payer: Self-pay | Admitting: Radiation Oncology

## 2023-04-10 VITALS — BP 135/86 | HR 100 | Temp 97.7°F | Resp 18 | Ht 68.0 in | Wt 200.4 lb

## 2023-04-10 DIAGNOSIS — C7951 Secondary malignant neoplasm of bone: Secondary | ICD-10-CM

## 2023-04-10 HISTORY — DX: Personal history of irradiation: Z92.3

## 2023-04-10 NOTE — Progress Notes (Signed)
Caitlin Greene is here today for follow up post radiation to the sacrum  They completed their radiation on: 03/03/23   Does the patient complain of any of the following:  Pain:No Abdominal bloating: No Diarrhea/Constipation: No Nausea/Vomiting: No Blood in Urine or Stool: No Urinary Issues (dysuria/incomplete emptying/ incontinence/ increased frequency/urgency): Urgency Post radiation skin changes: No   BP 135/86   Pulse 100   Temp 97.7 F (36.5 C)   Resp 18   Ht 5\' 8"  (1.727 m)   Wt 200 lb 6.4 oz (90.9 kg)   LMP 06/09/2017 (Exact Date)   SpO2 99%   BMI 30.47 kg/m

## 2023-05-02 ENCOUNTER — Other Ambulatory Visit (HOSPITAL_COMMUNITY): Payer: Self-pay

## 2023-05-04 ENCOUNTER — Inpatient Hospital Stay: Payer: Medicare HMO

## 2023-05-04 ENCOUNTER — Encounter: Payer: Self-pay | Admitting: Hematology & Oncology

## 2023-05-04 ENCOUNTER — Inpatient Hospital Stay (HOSPITAL_BASED_OUTPATIENT_CLINIC_OR_DEPARTMENT_OTHER): Payer: Medicare HMO | Admitting: Hematology & Oncology

## 2023-05-04 ENCOUNTER — Other Ambulatory Visit: Payer: Self-pay

## 2023-05-04 ENCOUNTER — Inpatient Hospital Stay: Payer: Medicare HMO | Attending: Hematology & Oncology

## 2023-05-04 VITALS — BP 130/85 | HR 81 | Temp 97.6°F | Resp 18 | Ht 68.0 in | Wt 202.0 lb

## 2023-05-04 DIAGNOSIS — C50919 Malignant neoplasm of unspecified site of unspecified female breast: Secondary | ICD-10-CM

## 2023-05-04 DIAGNOSIS — C50011 Malignant neoplasm of nipple and areola, right female breast: Secondary | ICD-10-CM

## 2023-05-04 DIAGNOSIS — C7951 Secondary malignant neoplasm of bone: Secondary | ICD-10-CM | POA: Insufficient documentation

## 2023-05-04 DIAGNOSIS — Z5111 Encounter for antineoplastic chemotherapy: Secondary | ICD-10-CM | POA: Diagnosis not present

## 2023-05-04 DIAGNOSIS — Z79899 Other long term (current) drug therapy: Secondary | ICD-10-CM | POA: Insufficient documentation

## 2023-05-04 LAB — CBC WITH DIFFERENTIAL (CANCER CENTER ONLY)
Abs Immature Granulocytes: 0.01 10*3/uL (ref 0.00–0.07)
Basophils Absolute: 0.1 10*3/uL (ref 0.0–0.1)
Basophils Relative: 2 %
Eosinophils Absolute: 0 10*3/uL (ref 0.0–0.5)
Eosinophils Relative: 1 %
HCT: 32.6 % — ABNORMAL LOW (ref 36.0–46.0)
Hemoglobin: 11.5 g/dL — ABNORMAL LOW (ref 12.0–15.0)
Immature Granulocytes: 1 %
Lymphocytes Relative: 30 %
Lymphs Abs: 0.7 10*3/uL (ref 0.7–4.0)
MCH: 36.3 pg — ABNORMAL HIGH (ref 26.0–34.0)
MCHC: 35.3 g/dL (ref 30.0–36.0)
MCV: 102.8 fL — ABNORMAL HIGH (ref 80.0–100.0)
Monocytes Absolute: 0.3 10*3/uL (ref 0.1–1.0)
Monocytes Relative: 12 %
Neutro Abs: 1.2 10*3/uL — ABNORMAL LOW (ref 1.7–7.7)
Neutrophils Relative %: 54 %
Platelet Count: 195 10*3/uL (ref 150–400)
RBC: 3.17 MIL/uL — ABNORMAL LOW (ref 3.87–5.11)
RDW: 16 % — ABNORMAL HIGH (ref 11.5–15.5)
WBC Count: 2.1 10*3/uL — ABNORMAL LOW (ref 4.0–10.5)
nRBC: 0 % (ref 0.0–0.2)

## 2023-05-04 LAB — CMP (CANCER CENTER ONLY)
ALT: 11 U/L (ref 0–44)
AST: 10 U/L — ABNORMAL LOW (ref 15–41)
Albumin: 4.1 g/dL (ref 3.5–5.0)
Alkaline Phosphatase: 56 U/L (ref 38–126)
Anion gap: 5 (ref 5–15)
BUN: 15 mg/dL (ref 6–20)
CO2: 28 mmol/L (ref 22–32)
Calcium: 9 mg/dL (ref 8.9–10.3)
Chloride: 106 mmol/L (ref 98–111)
Creatinine: 0.91 mg/dL (ref 0.44–1.00)
GFR, Estimated: >60 mL/min (ref >60)
Glucose, Bld: 93 mg/dL (ref 70–99)
Potassium: 4.3 mmol/L (ref 3.5–5.1)
Sodium: 139 mmol/L (ref 135–145)
Total Bilirubin: 0.4 mg/dL (ref <1.2)
Total Protein: 6.3 g/dL — ABNORMAL LOW (ref 6.5–8.1)

## 2023-05-04 MED ORDER — SODIUM CHLORIDE 0.9% FLUSH
10.0000 mL | INTRAVENOUS | Status: DC | PRN
Start: 2023-05-04 — End: 2023-05-04
  Administered 2023-05-04: 10 mL via INTRAVENOUS

## 2023-05-04 MED ORDER — FULVESTRANT 250 MG/5ML IM SOSY
500.0000 mg | PREFILLED_SYRINGE | INTRAMUSCULAR | Status: DC
Start: 2023-05-04 — End: 2023-05-04
  Administered 2023-05-04: 500 mg via INTRAMUSCULAR

## 2023-05-04 MED ORDER — HEPARIN SOD (PORK) LOCK FLUSH 100 UNIT/ML IV SOLN
500.0000 [IU] | Freq: Once | INTRAVENOUS | Status: AC
  Administered 2023-05-04: 500 [IU] via INTRAVENOUS

## 2023-05-04 NOTE — Patient Instructions (Signed)

## 2023-05-04 NOTE — Patient Instructions (Signed)

## 2023-05-04 NOTE — Progress Notes (Signed)
Hematology and Oncology Follow Up Visit  Caitlin Greene [id number] 1965/12/11 57 y.o. 05/04/2023   Principle Diagnosis:  Metastatic breast cancer-ER positive/HER-2 negative --bone metastasis only Iron deficiency anemia  Current Therapy:   Faslodex 500 mg IM monthly --start on 04/2020 Ibrance 100 mg p.o. daily (21d on/7d off) - start on 04/2020 Xgeva 120 mg subcu every 3 months -next dose 05/2023  IV iron-Venofer given on 10/21/2022   Status post XRT for right sacral lesion -completed on 03/03/2023 -- 3000 rad     Interim History:  Caitlin Greene is in for follow-up.  She had a wonderful Christmas.  She was at a very nice Thanksgiving.  Caitlin Greene just happy that she had a wonderful year.  She was had no specific complaints.  She has had some bilateral hip pain.  I know that she had radiotherapy for a right sacral lesion back in October.  Her last CA 27.29 was holding pretty steady at 58.  She is doing well with the Ibrance.  She has had no problems with diarrhea.  She has had no bleeding.  There is been no melena or bright red blood per rectum.  She has had no cough or shortness of breath.  She has had no leg swelling.  Her skin has been quite dry.  She has had no headache.  Overall, I would have said that her performance status is probably ECOG 1.    Wt Readings from Last 3 Encounters:  05/04/23 202 lb (91.6 kg)  04/10/23 200 lb 6.4 oz (90.9 kg)  04/05/23 198 lb (89.8 kg)    Medications:  Current Outpatient Medications:    acetaminophen (TYLENOL) 650 MG CR tablet, Take 650 mg by mouth every 8 (eight) hours as needed for pain., Disp: , Rfl:    calcium carbonate (OS-CAL) 600 MG tablet, Take 1 tablet (600 mg total) by mouth 2 (two) times daily., Disp: 30 tablet, Rfl: 0   Ferrous Sulfate (IRON) 325 (65 Fe) MG TABS, Take 1 tablet (325 mg total) by mouth daily at 2 PM., Disp: 30 tablet, Rfl: 0   FLUoxetine (PROZAC) 20 MG capsule, TAKE 3 CAPSULES(60 MG) BY MOUTH DAILY, Disp: 270 capsule,  Rfl: 3   fulvestrant (FASLODEX) 250 MG/5ML injection, Inject 500 mg into the muscle every 30 (thirty) days. One injection each buttock over 1-2 minutes. Warm prior to use., Disp: , Rfl:    IBRANCE 100 MG tablet, TAKE 1 TABLET DAILY ON DAYS 1 THROUGH 21 OF CYCLE, 7 DAYS OFF. REPEAT, Disp: 21 tablet, Rfl: 0   lidocaine-prilocaine (EMLA) cream, Apply 1 Application topically as needed., Disp: 30 g, Rfl: 0   LORazepam (ATIVAN) 1 MG tablet, Take 1 tablet (1 mg total) by mouth once as needed for up to 1 dose for anxiety. Take 1 tablet (1 mg total) by mouth 20 minutes before MRI scan. Please have a driver for this test as this medication has sedating effects., Disp: 1 tablet, Rfl: 0   meclizine (ANTIVERT) 12.5 MG tablet, Take 1 tablet (12.5 mg total) by mouth 3 (three) times daily as needed for dizziness., Disp: 40 tablet, Rfl: 1   oxyCODONE (OXY IR/ROXICODONE) 5 MG immediate release tablet, Take 1 tablet (5 mg total) by mouth every 8 (eight) hours as needed for severe pain., Disp: 50 tablet, Rfl: 0   pantoprazole (PROTONIX) 40 MG tablet, TAKE 1 TABLET(40 MG) BY MOUTH TWICE DAILY BEFORE A MEAL, Disp: 60 tablet, Rfl: 5   vitamin B-12 (CYANOCOBALAMIN) 1000 MCG tablet, Take 1  tablet (1,000 mcg total) by mouth daily., Disp: 30 tablet, Rfl: 0   Wheat Dextrin (BENEFIBER DRINK MIX PO), Take by mouth daily at 6 (six) AM., Disp: , Rfl:  No current facility-administered medications for this visit.  Facility-Administered Medications Ordered in Other Visits:    fulvestrant (FASLODEX) injection 500 mg, 500 mg, Intramuscular, Q30 days, Josph Macho, MD, 500 mg at 05/04/23 1055  Allergies:  Allergies  Allergen Reactions   Amoxicillin-Pot Clavulanate Hives and Itching   Iron Anaphylaxis and Other (See Comments)    Patient develops hypotension, resp distress after infusion of feraheme.    Penicillins Hives, Itching and Other (See Comments)   Codeine Nausea And Vomiting    Needs pre-meds     Past Medical  History, Surgical history, Social history, and Family History were reviewed and updated.  Review of Systems: Review of Systems  Constitutional:  Negative for appetite change.  HENT:  Negative.    Eyes: Negative.   Respiratory: Negative.    Cardiovascular: Negative.   Gastrointestinal:  Negative for abdominal pain and nausea.  Endocrine: Negative.   Genitourinary: Negative.    Musculoskeletal:  Positive for arthralgias and back pain.  Skin: Negative.   Neurological:  Negative for dizziness.  Hematological: Negative.   Psychiatric/Behavioral: Negative.      Physical Exam:  height is 5\' 8"  (1.727 m) and weight is 202 lb (91.6 kg). Her oral temperature is 97.6 F (36.4 C). Her blood pressure is 130/85 and her pulse is 81. Her respiration is 18 and oxygen saturation is 100%.   Wt Readings from Last 3 Encounters:  05/04/23 202 lb (91.6 kg)  04/10/23 200 lb 6.4 oz (90.9 kg)  04/05/23 198 lb (89.8 kg)    Physical Exam Vitals reviewed.  HENT:     Head: Normocephalic and atraumatic.  Eyes:     Pupils: Pupils are equal, round, and reactive to light.  Cardiovascular:     Rate and Rhythm: Normal rate and regular rhythm.     Heart sounds: Normal heart sounds.  Pulmonary:     Effort: Pulmonary effort is normal.     Breath sounds: Normal breath sounds.  Abdominal:     General: Bowel sounds are normal.     Palpations: Abdomen is soft.  Musculoskeletal:        General: No tenderness or deformity. Normal range of motion.     Cervical back: Normal range of motion.  Lymphadenopathy:     Cervical: No cervical adenopathy.  Skin:    General: Skin is warm and dry.     Findings: No erythema or rash.  Neurological:     Mental Status: She is alert and oriented to person, place, and time.  Psychiatric:        Behavior: Behavior normal.        Thought Content: Thought content normal.        Judgment: Judgment normal.     Lab Results  Component Value Date   WBC 2.1 (L) 05/04/2023    HGB 11.5 (L) 05/04/2023   HCT 32.6 (L) 05/04/2023   MCV 102.8 (H) 05/04/2023   PLT 195 05/04/2023     Chemistry      Component Value Date/Time   NA 139 05/04/2023 1010   K 4.3 05/04/2023 1010   CL 106 05/04/2023 1010   CO2 28 05/04/2023 1010   BUN 15 05/04/2023 1010   CREATININE 0.91 05/04/2023 1010   CREATININE 0.88 10/22/2019 1246      Component  Value Date/Time   CALCIUM 9.0 05/04/2023 1010   ALKPHOS 56 05/04/2023 1010   AST 10 (L) 05/04/2023 1010   ALT 11 05/04/2023 1010   BILITOT 0.4 05/04/2023 1010       Impression and Plan: Ms. Belko is a very charming 57 year old postmenopausal female with metastatic breast cancer.  We actually had seen her many years ago with a carcinoma in situ.  From my point of view, I think things are looking quite good for her.  I am happy that she has done so well on antiestrogen therapy.  We have been doing this for over 3 years.  I do not think we have to do any scans on her probably until February.  Will plan to get her back in 1 month.

## 2023-05-05 LAB — CANCER ANTIGEN 27.29: CA 27.29: 53.4 U/mL — ABNORMAL HIGH (ref 0.0–38.6)

## 2023-05-08 ENCOUNTER — Other Ambulatory Visit (HOSPITAL_COMMUNITY): Payer: Self-pay

## 2023-05-08 ENCOUNTER — Other Ambulatory Visit: Payer: Self-pay

## 2023-05-08 ENCOUNTER — Other Ambulatory Visit: Payer: Self-pay | Admitting: Hematology & Oncology

## 2023-05-08 MED ORDER — IBRANCE 100 MG PO TABS
ORAL_TABLET | ORAL | 0 refills | Status: DC
  Filled 2023-05-08: qty 21, 28d supply, fill #0

## 2023-05-08 NOTE — Progress Notes (Signed)
Specialty Pharmacy Refill Coordination Note  Caitlin Greene is a 57 y.o. female contacted today regarding refills of specialty medication(s) Palbociclib [name]   Patient requested Delivery   Delivery date: 05/12/23   Verified address: [address]  [place] [address]   Medication will be filled on 05/11/23. Pending refill request  Patient had expressed concern of coverage for the new year. I informed her that our patient advocate and myself would look into this and will give her a call back on Thursday 05/11/23 with an update if there's a copay on her medication.

## 2023-05-11 ENCOUNTER — Other Ambulatory Visit: Payer: Self-pay

## 2023-05-12 ENCOUNTER — Other Ambulatory Visit: Payer: Self-pay

## 2023-05-15 ENCOUNTER — Other Ambulatory Visit (HOSPITAL_COMMUNITY): Payer: Self-pay

## 2023-05-15 ENCOUNTER — Other Ambulatory Visit: Payer: Self-pay

## 2023-05-16 ENCOUNTER — Other Ambulatory Visit: Payer: Self-pay

## 2023-05-17 ENCOUNTER — Other Ambulatory Visit: Payer: Self-pay

## 2023-05-23 ENCOUNTER — Ambulatory Visit (INDEPENDENT_AMBULATORY_CARE_PROVIDER_SITE_OTHER): Payer: Medicare HMO | Admitting: Internal Medicine

## 2023-05-23 ENCOUNTER — Encounter: Payer: Self-pay | Admitting: Internal Medicine

## 2023-05-23 VITALS — BP 116/80 | HR 112 | Temp 98.2°F | Ht 68.0 in | Wt 198.0 lb

## 2023-05-23 DIAGNOSIS — B9689 Other specified bacterial agents as the cause of diseases classified elsewhere: Secondary | ICD-10-CM

## 2023-05-23 DIAGNOSIS — J069 Acute upper respiratory infection, unspecified: Secondary | ICD-10-CM

## 2023-05-23 MED ORDER — AZITHROMYCIN 250 MG PO TABS: ORAL_TABLET | ORAL | 0 refills | Status: AC

## 2023-05-23 NOTE — Progress Notes (Signed)
   Subjective:   Patient ID: Caitlin Greene, female    DOB: [birth year], 58 y.o.   MRN: [id number]  HPI The patient is a 58 YO female coming in for about 2 weeks of uri symptoms.Overall not imrpoving and cough seems to be worsening.   Review of Systems  Constitutional:  Positive for activity change, appetite change and chills. Negative for fatigue, fever and unexpected weight change.  HENT:  Positive for congestion, postnasal drip, rhinorrhea and sinus pressure. Negative for ear discharge, ear pain, sinus pain, sneezing, sore throat, tinnitus, trouble swallowing and voice change.   Eyes: Negative.   Respiratory:  Positive for cough. Negative for chest tightness, shortness of breath and wheezing.   Cardiovascular: Negative.   Gastrointestinal: Negative.   Musculoskeletal:  Positive for myalgias.  Neurological: Negative.     Objective:  Physical Exam Constitutional:      Appearance: She is well-developed.  HENT:     Head: Normocephalic and atraumatic.     Comments: Oropharynx with redness and clear drainage, nose with swollen turbinates, TMs normal bilaterally.  Neck:     Thyroid : No thyromegaly.  Cardiovascular:     Rate and Rhythm: Normal rate and regular rhythm.  Pulmonary:     Effort: Pulmonary effort is normal. No respiratory distress.     Breath sounds: Rhonchi present. No wheezing or rales.  Abdominal:     Palpations: Abdomen is soft.  Musculoskeletal:        General: Tenderness present.     Cervical back: Normal range of motion.  Lymphadenopathy:     Cervical: No cervical adenopathy.  Skin:    General: Skin is warm and dry.  Neurological:     Mental Status: She is alert and oriented to person, place, and time.     Vitals:   05/23/23 1517  BP: 116/80  Pulse: (!) 112  Temp: 98.2 F (36.8 C)  TempSrc: Oral  SpO2: 96%  Weight: 198 lb (89.8 kg)  Height: 5' 8 (1.727 m)    Assessment & Plan:

## 2023-05-23 NOTE — Patient Instructions (Signed)
 We have sent in the z-pack to take 2 pills today, then starting tomorrow take 1 pill daily until gone.

## 2023-05-26 ENCOUNTER — Encounter: Payer: Self-pay | Admitting: Internal Medicine

## 2023-05-26 NOTE — Assessment & Plan Note (Signed)
Given length of symptoms and worsening sputum with rhonchi on exam will cover with z-pack.

## 2023-05-31 ENCOUNTER — Other Ambulatory Visit: Payer: Self-pay | Admitting: Hematology & Oncology

## 2023-05-31 ENCOUNTER — Other Ambulatory Visit: Payer: Self-pay

## 2023-05-31 MED ORDER — IBRANCE 100 MG PO TABS
ORAL_TABLET | ORAL | 0 refills | Status: DC
  Filled 2023-05-31: qty 21, 28d supply, fill #0

## 2023-05-31 NOTE — Progress Notes (Signed)
Specialty Pharmacy Refill Coordination Note  Caitlin Greene is a 58 y.o. female contacted today regarding refills of specialty medication(s) Palbociclib [name]   Patient requested Delivery   Delivery date: 06/08/23   Verified address: [address]  [place] [address]   Medication will be filled on 06/07/23, pending refill approval.

## 2023-06-06 ENCOUNTER — Other Ambulatory Visit: Payer: Self-pay

## 2023-06-08 ENCOUNTER — Inpatient Hospital Stay: Payer: Medicare HMO

## 2023-06-08 ENCOUNTER — Encounter: Payer: Self-pay | Admitting: Medical Oncology

## 2023-06-08 ENCOUNTER — Inpatient Hospital Stay: Payer: Medicare HMO | Attending: Hematology & Oncology

## 2023-06-08 ENCOUNTER — Inpatient Hospital Stay (HOSPITAL_BASED_OUTPATIENT_CLINIC_OR_DEPARTMENT_OTHER): Payer: Medicare HMO | Admitting: Medical Oncology

## 2023-06-08 VITALS — BP 106/70 | HR 94 | Temp 98.0°F | Resp 16 | Ht 68.0 in | Wt 199.0 lb

## 2023-06-08 DIAGNOSIS — C50011 Malignant neoplasm of nipple and areola, right female breast: Secondary | ICD-10-CM

## 2023-06-08 DIAGNOSIS — C7951 Secondary malignant neoplasm of bone: Secondary | ICD-10-CM | POA: Diagnosis not present

## 2023-06-08 DIAGNOSIS — C50012 Malignant neoplasm of nipple and areola, left female breast: Secondary | ICD-10-CM | POA: Insufficient documentation

## 2023-06-08 DIAGNOSIS — M25551 Pain in right hip: Secondary | ICD-10-CM | POA: Insufficient documentation

## 2023-06-08 DIAGNOSIS — C50919 Malignant neoplasm of unspecified site of unspecified female breast: Secondary | ICD-10-CM

## 2023-06-08 DIAGNOSIS — Z923 Personal history of irradiation: Secondary | ICD-10-CM | POA: Diagnosis not present

## 2023-06-08 DIAGNOSIS — D500 Iron deficiency anemia secondary to blood loss (chronic): Secondary | ICD-10-CM | POA: Insufficient documentation

## 2023-06-08 DIAGNOSIS — M25552 Pain in left hip: Secondary | ICD-10-CM | POA: Diagnosis not present

## 2023-06-08 DIAGNOSIS — D510 Vitamin B12 deficiency anemia due to intrinsic factor deficiency: Secondary | ICD-10-CM

## 2023-06-08 DIAGNOSIS — D519 Vitamin B12 deficiency anemia, unspecified: Secondary | ICD-10-CM | POA: Insufficient documentation

## 2023-06-08 DIAGNOSIS — Z1722 Progesterone receptor negative status: Secondary | ICD-10-CM | POA: Insufficient documentation

## 2023-06-08 DIAGNOSIS — Z5111 Encounter for antineoplastic chemotherapy: Secondary | ICD-10-CM | POA: Diagnosis not present

## 2023-06-08 DIAGNOSIS — Z1732 Human epidermal growth factor receptor 2 negative status: Secondary | ICD-10-CM | POA: Insufficient documentation

## 2023-06-08 DIAGNOSIS — Z170 Estrogen receptor positive status [ER+]: Secondary | ICD-10-CM | POA: Diagnosis not present

## 2023-06-08 LAB — CBC WITH DIFFERENTIAL (CANCER CENTER ONLY)
Abs Immature Granulocytes: 0 10*3/uL (ref 0.00–0.07)
Basophils Absolute: 0.1 10*3/uL (ref 0.0–0.1)
Basophils Relative: 2 %
Eosinophils Absolute: 0.1 10*3/uL (ref 0.0–0.5)
Eosinophils Relative: 3 %
HCT: 34.9 % — ABNORMAL LOW (ref 36.0–46.0)
Hemoglobin: 12.1 g/dL (ref 12.0–15.0)
Immature Granulocytes: 0 %
Lymphocytes Relative: 31 %
Lymphs Abs: 0.8 10*3/uL (ref 0.7–4.0)
MCH: 36.3 pg — ABNORMAL HIGH (ref 26.0–34.0)
MCHC: 34.7 g/dL (ref 30.0–36.0)
MCV: 104.8 fL — ABNORMAL HIGH (ref 80.0–100.0)
Monocytes Absolute: 0.5 10*3/uL (ref 0.1–1.0)
Monocytes Relative: 19 %
Neutro Abs: 1.1 10*3/uL — ABNORMAL LOW (ref 1.7–7.7)
Neutrophils Relative %: 45 %
Platelet Count: 181 10*3/uL (ref 150–400)
RBC: 3.33 MIL/uL — ABNORMAL LOW (ref 3.87–5.11)
RDW: 15.1 % (ref 11.5–15.5)
WBC Count: 2.5 10*3/uL — ABNORMAL LOW (ref 4.0–10.5)
nRBC: 0 % (ref 0.0–0.2)

## 2023-06-08 LAB — CMP (CANCER CENTER ONLY)
ALT: 14 U/L (ref 0–44)
AST: 14 U/L — ABNORMAL LOW (ref 15–41)
Albumin: 4.3 g/dL (ref 3.5–5.0)
Alkaline Phosphatase: 40 U/L (ref 38–126)
Anion gap: 8 (ref 5–15)
BUN: 18 mg/dL (ref 6–20)
CO2: 27 mmol/L (ref 22–32)
Calcium: 9.4 mg/dL (ref 8.9–10.3)
Chloride: 103 mmol/L (ref 98–111)
Creatinine: 1.1 mg/dL — ABNORMAL HIGH (ref 0.44–1.00)
GFR, Estimated: 58 mL/min — ABNORMAL LOW (ref >60)
Glucose, Bld: 101 mg/dL — ABNORMAL HIGH (ref 70–99)
Potassium: 4 mmol/L (ref 3.5–5.1)
Sodium: 138 mmol/L (ref 135–145)
Total Bilirubin: 0.7 mg/dL (ref 0.0–1.2)
Total Protein: 7 g/dL (ref 6.5–8.1)

## 2023-06-08 LAB — IRON AND IRON BINDING CAPACITY (CC-WL,HP ONLY)
Iron: 98 ug/dL (ref 28–170)
Saturation Ratios: 29 % (ref 10.4–31.8)
TIBC: 333 ug/dL (ref 250–450)
UIBC: 235 ug/dL (ref 148–442)

## 2023-06-08 LAB — FERRITIN: Ferritin: 79 ng/mL (ref 11–307)

## 2023-06-08 MED ORDER — DENOSUMAB 120 MG/1.7ML ~~LOC~~ SOLN
120.0000 mg | Freq: Once | SUBCUTANEOUS | Status: AC
  Administered 2023-06-08: 120 mg via SUBCUTANEOUS
  Filled 2023-06-08: qty 1.7

## 2023-06-08 MED ORDER — SODIUM CHLORIDE 0.9% FLUSH
10.0000 mL | INTRAVENOUS | Status: DC | PRN
  Administered 2023-06-08: 10 mL via INTRAVENOUS

## 2023-06-08 MED ORDER — FULVESTRANT 250 MG/5ML IM SOSY
500.0000 mg | PREFILLED_SYRINGE | INTRAMUSCULAR | Status: DC
  Administered 2023-06-08: 500 mg via INTRAMUSCULAR
  Filled 2023-06-08: qty 10

## 2023-06-08 MED ORDER — HEPARIN SOD (PORK) LOCK FLUSH 100 UNIT/ML IV SOLN
500.0000 [IU] | Freq: Once | INTRAVENOUS | Status: AC
  Administered 2023-06-08: 500 [IU] via INTRAVENOUS

## 2023-06-08 NOTE — Patient Instructions (Signed)
Denosumab Injection (Oncology) What is this medication? DENOSUMAB (den oh SUE mab) prevents weakened bones caused by cancer. It may also be used to treat noncancerous bone tumors that cannot be removed by surgery. It can also be used to treat high calcium levels in the blood caused by cancer. It works by blocking a protein that causes bones to break down quickly. This slows down the release of calcium from bones, which lowers calcium levels in your blood. It also makes your bones stronger and less likely to break (fracture). This medicine may be used for other purposes; ask your health care provider or pharmacist if you have questions. COMMON BRAND NAME(S): XGEVA What should I tell my care team before I take this medication? They need to know if you have any of these conditions: Dental disease Having surgery or tooth extraction Infection Kidney disease Low levels of calcium or vitamin D in the blood Malnutrition On hemodialysis Skin conditions or sensitivity Thyroid or parathyroid disease An unusual reaction to denosumab, other medications, foods, dyes, or preservatives Pregnant or trying to get pregnant Breast-feeding How should I use this medication? This medication is for injection under the skin. It is given by your care team in a hospital or clinic setting. A special MedGuide will be given to you before each treatment. Be sure to read this information carefully each time. Talk to your care team about the use of this medication in children. While it may be prescribed for children as young as 13 years for selected conditions, precautions do apply. Overdosage: If you think you have taken too much of this medicine contact a poison control center or emergency room at once. NOTE: This medicine is only for you. Do not share this medicine with others. What if I miss a dose? Keep appointments for follow-up doses. It is important not to miss your dose. Call your care team if you are unable to  keep an appointment. What may interact with this medication? Do not take this medication with any of the following: Other medications containing denosumab This medication may also interact with the following: Medications that lower your chance of fighting infection Steroid medications, such as prednisone or cortisone This list may not describe all possible interactions. Give your health care provider a list of all the medicines, herbs, non-prescription drugs, or dietary supplements you use. Also tell them if you smoke, drink alcohol, or use illegal drugs. Some items may interact with your medicine. What should I watch for while using this medication? Your condition will be monitored carefully while you are receiving this medication. You may need blood work while taking this medication. This medication may increase your risk of getting an infection. Call your care team for advice if you get a fever, chills, sore throat, or other symptoms of a cold or flu. Do not treat yourself. Try to avoid being around people who are sick. You should make sure you get enough calcium and vitamin D while you are taking this medication, unless your care team tells you not to. Discuss the foods you eat and the vitamins you take with your care team. Some people who take this medication have severe bone, joint, or muscle pain. This medication may also increase your risk for jaw problems or a broken thigh bone. Tell your care team right away if you have severe pain in your jaw, bones, joints, or muscles. Tell your care team if you have any pain that does not go away or that gets worse. Talk  to your care team if you may be pregnant. Serious birth defects can occur if you take this medication during pregnancy and for 5 months after the last dose. You will need a negative pregnancy test before starting this medication. Contraception is recommended while taking this medication and for 5 months after the last dose. Your care team  can help you find the option that works for you. What side effects may I notice from receiving this medication? Side effects that you should report to your care team as soon as possible: Allergic reactions--skin rash, itching, hives, swelling of the face, lips, tongue, or throat Bone, joint, or muscle pain Low calcium level--muscle pain or cramps, confusion, tingling, or numbness in the hands or feet Osteonecrosis of the jaw--pain, swelling, or redness in the mouth, numbness of the jaw, poor healing after dental work, unusual discharge from the mouth, visible bones in the mouth Side effects that usually do not require medical attention (report to your care team if they continue or are bothersome): Cough Diarrhea Fatigue Headache Nausea This list may not describe all possible side effects. Call your doctor for medical advice about side effects. You may report side effects to FDA at 1-800-FDA-1088. Where should I keep my medication? This medication is given in a hospital or clinic. It will not be stored at home. NOTE: This sheet is a summary. It may not cover all possible information. If you have questions about this medicine, talk to your doctor, pharmacist, or health care provider.  2024 Elsevier/Gold Standard (2021-09-15 00:00:00)Fulvestrant Injection What is this medication? FULVESTRANT (ful VES trant) treats breast cancer. It works by blocking the hormone estrogen in breast tissue, which prevents breast cancer cells from spreading or growing. This medicine may be used for other purposes; ask your health care provider or pharmacist if you have questions. COMMON BRAND NAME(S): FASLODEX What should I tell my care team before I take this medication? They need to know if you have any of these conditions: Bleeding disorder Liver disease Low blood cell levels (white cells, red cells, and platelets) An unusual or allergic reaction to fulvestrant, other medications, foods, dyes, or  preservatives Pregnant or trying to get pregnant Breastfeeding How should I use this medication? This medication is injected into a muscle. It is given by your care team in a hospital or clinic setting. Talk to your care team about the use of this medication in children. Special care may be needed. Overdosage: If you think you have taken too much of this medicine contact a poison control center or emergency room at once. NOTE: This medicine is only for you. Do not share this medicine with others. What if I miss a dose? Keep appointments for follow-up doses. It is important not to miss your dose. Call your care team if you are unable to keep an appointment. What may interact with this medication? Fluoroestradiol F18 This list may not describe all possible interactions. Give your health care provider a list of all the medicines, herbs, non-prescription drugs, or dietary supplements you use. Also tell them if you smoke, drink alcohol, or use illegal drugs. Some items may interact with your medicine. What should I watch for while using this medication? Your condition will be monitored carefully while you are receiving this medication. You may need blood work done while you are taking this medication. This medication is injected into a muscle. Talk to your care team if you also take medications that prevent or treat blood clots, such as warfarin.  Blood thinners may increase the risk of bleeding or bruising in the muscle where this medication is injected. The benefits of this medication may outweigh the risks. Your care team can help you find the option that works for you. They can also help limit the risk of bleeding. Talk to your care team if you may be pregnant. Serious birth defects can occur if you take this medication during pregnancy and for 1 year after the last dose. You will need a negative pregnancy test before starting this medication. Contraception is recommended while taking this medication  and for 1 year after the last dose. Your care team can help you find the option that works for you. Do not breastfeed while taking this medication and for 1 year after the last dose. This medication may cause infertility. Talk to your care team if you are concerned about your fertility. What side effects may I notice from receiving this medication? Side effects that you should report to your care team as soon as possible: Allergic reactions or angioedema--skin rash, itching or hives, swelling of the face, eyes, lips, tongue, arms, or legs, trouble swallowing or breathing Pain, tingling, or numbness in the hands or feet Side effects that usually do not require medical attention (report to your care team if they continue or are bothersome): Bone, joint, or muscle pain Constipation Headache Hot flashes Nausea Pain, redness, or irritation at injection site Unusual weakness or fatigue This list may not describe all possible side effects. Call your doctor for medical advice about side effects. You may report side effects to FDA at 1-800-FDA-1088. Where should I keep my medication? This medication is given in a hospital or clinic. It will not be stored at home. NOTE: This sheet is a summary. It may not cover all possible information. If you have questions about this medicine, talk to your doctor, pharmacist, or health care provider.  2024 Elsevier/Gold Standard (2022-12-30 00:00:00)

## 2023-06-08 NOTE — Progress Notes (Signed)
Hematology and Oncology Follow Up Visit  Caitlin Greene [id number] [birth year] 58 y.o. 06/08/2023   Principle Diagnosis:  Metastatic breast cancer-ER positive, PR negative/HER-2 negative --bone metastasis only Iron deficiency anemia  Current Therapy:   Faslodex 500 mg IM monthly --start on 04/2020 Ibrance 100 mg p.o. daily (21d on/7d off) - start on 04/2020 Xgeva 120 mg subcu every 3 months -next dose 05/2023  IV iron-Venofer given on 10/21/2022   Status post XRT for right sacral lesion -completed on 03/03/2023 -- 3000 rad     Interim History:  Caitlin Greene is in for follow-up.    She was had no specific complaints.  She has been well.   Her chronic bilateral hip pain has been stable.  I know that she had radiotherapy for a right sacral lesion back in October.  Her last CA 27.29 was 53.4 which is stable  She is doing well with the Ibrance.  She has had no problems with diarrhea.  She has had no bleeding.  There is been no melena or bright red blood per rectum.  She has had no cough or shortness of breath.  She has had no leg swelling.  Her skin has been quite dry.  She has had no headache.  No night sweats or unintentional weight loss  Overall, I would have said that her performance status is probably ECOG 1.    Wt Readings from Last 3 Encounters:  06/08/23 199 lb (90.3 kg)  05/23/23 198 lb (89.8 kg)  05/04/23 202 lb (91.6 kg)    Medications:  Current Outpatient Medications:    acetaminophen (TYLENOL) 650 MG CR tablet, Take 650 mg by mouth every 8 (eight) hours as needed for pain., Disp: , Rfl:    calcium carbonate (OS-CAL) 600 MG tablet, Take 1 tablet (600 mg total) by mouth 2 (two) times daily., Disp: 30 tablet, Rfl: 0   Ferrous Sulfate (IRON) 325 (65 Fe) MG TABS, Take 1 tablet (325 mg total) by mouth daily at 2 PM., Disp: 30 tablet, Rfl: 0   FLUoxetine (PROZAC) 20 MG capsule, TAKE 3 CAPSULES(60 MG) BY MOUTH DAILY, Disp: 270 capsule, Rfl: 3   fulvestrant (FASLODEX) 250  MG/5ML injection, Inject 500 mg into the muscle every 30 (thirty) days. One injection each buttock over 1-2 minutes. Warm prior to use., Disp: , Rfl:    IBRANCE 100 MG tablet, TAKE 1 TABLET DAILY ON DAYS 1 THROUGH 21 OF CYCLE, 7 DAYS OFF. REPEAT, Disp: 21 tablet, Rfl: 0   lidocaine-prilocaine (EMLA) cream, Apply 1 Application topically as needed., Disp: 30 g, Rfl: 0   meclizine (ANTIVERT) 12.5 MG tablet, Take 1 tablet (12.5 mg total) by mouth 3 (three) times daily as needed for dizziness., Disp: 40 tablet, Rfl: 1   oxyCODONE (OXY IR/ROXICODONE) 5 MG immediate release tablet, Take 1 tablet (5 mg total) by mouth every 8 (eight) hours as needed for severe pain., Disp: 50 tablet, Rfl: 0   pantoprazole (PROTONIX) 40 MG tablet, TAKE 1 TABLET(40 MG) BY MOUTH TWICE DAILY BEFORE A MEAL, Disp: 60 tablet, Rfl: 5   vitamin B-12 (CYANOCOBALAMIN) 1000 MCG tablet, Take 1 tablet (1,000 mcg total) by mouth daily., Disp: 30 tablet, Rfl: 0   Wheat Dextrin (BENEFIBER DRINK MIX PO), Take by mouth daily at 6 (six) AM., Disp: , Rfl:    LORazepam (ATIVAN) 1 MG tablet, Take 1 tablet (1 mg total) by mouth once as needed for up to 1 dose for anxiety. Take 1 tablet (1 mg total) by  mouth 20 minutes before MRI scan. Please have a driver for this test as this medication has sedating effects. (Patient not taking: Reported on 06/08/2023), Disp: 1 tablet, Rfl: 0  Allergies:  Allergies  Allergen Reactions   Amoxicillin-Pot Clavulanate Hives and Itching   Iron Anaphylaxis and Other (See Comments)    Patient develops hypotension, resp distress after infusion of feraheme.    Penicillins Hives, Itching and Other (See Comments)   Codeine Nausea And Vomiting    Needs pre-meds     Past Medical History, Surgical history, Social history, and Family History were reviewed and updated.  Review of Systems: Review of Systems  Constitutional:  Negative for appetite change.  HENT:  Negative.    Eyes: Negative.   Respiratory: Negative.     Cardiovascular: Negative.   Gastrointestinal:  Negative for abdominal pain and nausea.  Endocrine: Negative.   Genitourinary: Negative.    Musculoskeletal:  Positive for arthralgias and back pain.  Skin: Negative.   Neurological:  Negative for dizziness.  Hematological: Negative.   Psychiatric/Behavioral: Negative.      Physical Exam:  height is 5\' 8"  (1.727 m) and weight is 199 lb (90.3 kg). Her oral temperature is 98 F (36.7 C). Her blood pressure is 106/70 and her pulse is 94. Her respiration is 16 and oxygen saturation is 96%.   Wt Readings from Last 3 Encounters:  06/08/23 199 lb (90.3 kg)  05/23/23 198 lb (89.8 kg)  05/04/23 202 lb (91.6 kg)    Physical Exam Vitals reviewed.  HENT:     Head: Normocephalic and atraumatic.  Eyes:     Pupils: Pupils are equal, round, and reactive to light.  Cardiovascular:     Rate and Rhythm: Normal rate and regular rhythm.     Heart sounds: Normal heart sounds.  Pulmonary:     Effort: Pulmonary effort is normal.     Breath sounds: Normal breath sounds.  Abdominal:     General: Bowel sounds are normal.     Palpations: Abdomen is soft.  Musculoskeletal:        General: No tenderness or deformity. Normal range of motion.     Cervical back: Normal range of motion.  Lymphadenopathy:     Cervical: No cervical adenopathy.  Skin:    General: Skin is warm and dry.     Findings: No erythema or rash.  Neurological:     Mental Status: She is alert and oriented to person, place, and time.  Psychiatric:        Behavior: Behavior normal.        Thought Content: Thought content normal.        Judgment: Judgment normal.      Lab Results  Component Value Date   WBC 2.5 (L) 06/08/2023   HGB 12.1 06/08/2023   HCT 34.9 (L) 06/08/2023   MCV 104.8 (H) 06/08/2023   PLT 181 06/08/2023     Chemistry      Component Value Date/Time   NA 138 06/08/2023 1003   K 4.0 06/08/2023 1003   CL 103 06/08/2023 1003   CO2 27 06/08/2023 1003   BUN  18 06/08/2023 1003   CREATININE 1.10 (H) 06/08/2023 1003   CREATININE 0.88 10/22/2019 1246      Component Value Date/Time   CALCIUM 9.4 06/08/2023 1003   ALKPHOS 40 06/08/2023 1003   AST 14 (L) 06/08/2023 1003   ALT 14 06/08/2023 1003   BILITOT 0.7 06/08/2023 1003     Encounter Diagnoses  Name Primary?   Malignant neoplasm of nipple of both breasts in female, estrogen receptor positive (HCC) Yes   Vitamin B12 deficiency anemia due to intrinsic factor deficiency    Metastatic cancer to bone (HCC)    Iron deficiency anemia due to chronic blood loss    Primary malignant neoplasm of breast with metastasis (HCC)     Impression and Plan: Ms. Villela is a very charming 58 year old postmenopausal female with metastatic breast cancer.  We actually had seen her many years ago with a carcinoma in situ.  She appear to be doing well. CA 27.29 is stable. CBC and CMP look stable.   Will check a B12 and folate at next visit given the macrocytosis (likely due to her medications but will be complete in work up)  Rivka Barbara, Faslodex today Continue Ibrance at current dose CT chest/ABD/pelvis/bone scan due next month- will place orders today RTC 1 month MD, labs (CBC w/, CMP, CA 27.29, ferritin, iron, B12, folate), Faslodex

## 2023-06-08 NOTE — Patient Instructions (Signed)
Implanted [facility] Guide An implanted port is a device that is placed under the skin. It is usually placed in the chest. The device may vary based on the need. Implanted ports can be used to give IV medicine, to take blood, or to give fluids. You may have an implanted port if: You need IV medicine that would be irritating to the small veins in your hands or arms. You need IV medicines, such as chemotherapy, for a long period of time. You need IV nutrition for a long period of time. You may have fewer limitations when using a port than you would if you used other types of long-term IVs. You will also likely be able to return to normal activities after your incision heals. An implanted port has two main parts: Reservoir. The reservoir is the part where a needle is inserted to give medicines or draw blood. The reservoir is round. After the port is placed, it appears as a small, raised area under your skin. Catheter. The catheter is a small, thin tube that connects the reservoir to a vein. Medicine that is inserted into the reservoir goes into the catheter and then into the vein. How is my port accessed? To access your port: A numbing cream may be placed on the skin over the port site. Your health care provider will put on a mask and sterile gloves. The skin over your port will be cleaned carefully with a germ-killing soap and allowed to dry. Your health care provider will gently pinch the port and insert a needle into it. Your health care provider will check for a blood return to make sure the port is in the vein and is still working (patent). If your port needs to remain accessed to get medicine continuously (constant infusion), your health care provider will place a clear bandage (dressing) over the needle site. The dressing and needle will need to be changed every week, or as told by your health care provider. What is flushing? Flushing helps keep the port working. Follow instructions from your  health care provider about how and when to flush the port. Ports are usually flushed with saline solution or a medicine called heparin. The need for flushing will depend on how the port is used: If the port is only used from time to time to give medicines or draw blood, the port may need to be flushed: Before and after medicines have been given. Before and after blood has been drawn. As part of routine maintenance. Flushing may be recommended every 4-6 weeks. If a constant infusion is running, the port may not need to be flushed. Throw away any syringes in a disposal container that is meant for sharp items (sharps container). You can buy a sharps container from a pharmacy, or you can make one by using an empty hard plastic bottle with a cover. How long will my port stay implanted? The port can stay in for as long as your health care provider thinks it is needed. When it is time for the port to come out, a surgery will be done to remove it. The surgery will be similar to the procedure that was done to put the port in. Follow these instructions at home: Caring for your port and port site Flush your port as told by your health care provider. If you need an infusion over several days, follow instructions from your health care provider about how to take care of your port site. Make sure you: Change your  dressing as told by your health care provider. Wash your hands with soap and water for at least 20 seconds before and after you change your dressing. If soap and water are not available, use alcohol-based hand sanitizer. Place any used dressings or infusion bags into a plastic bag. Throw that bag in the trash. Keep the dressing that covers the needle clean and dry. Do not get it wet. Do not use scissors or sharp objects near the infusion tubing. Keep any external tubes clamped, unless they are being used. Check your port site every day for signs of infection. Check for: Redness, swelling, or  pain. Fluid or blood. Warmth. Pus or a bad smell. Protect the skin around the port site. Avoid wearing bra straps that rub or irritate the site. Protect the skin around your port from seat belts. Place a soft pad over your chest if needed. Bathe or shower as told by your health care provider. The site may get wet as long as you are not actively receiving an infusion. General instructions  Return to your normal activities as told by your health care provider. Ask your health care provider what activities are safe for you. Carry a medical alert card or wear a medical alert bracelet at all times. This will let health care providers know that you have an implanted port in case of an emergency. Where to find more information American Cancer Society: www.cancer.org American Society of Clinical Oncology: www.cancer.net Contact a health care provider if: You have a fever or chills. You have redness, swelling, or pain at the port site. You have fluid or blood coming from your port site. Your incision feels warm to the touch. You have pus or a bad smell coming from the port site. Summary Implanted ports are usually placed in the chest for long-term IV access. Follow instructions from your health care provider about flushing the port and changing bandages (dressings). Take care of the area around your port by avoiding clothing that puts pressure on the area, and by watching for signs of infection. Protect the skin around your port from seat belts. Place a soft pad over your chest if needed. Contact a health care provider if you have a fever or you have redness, swelling, pain, fluid, or a bad smell at the port site. This information is not intended to replace advice given to you by your health care provider. Make sure you discuss any questions you have with your health care provider. Document Revised: 10/27/2020 Document Reviewed: 10/27/2020 Elsevier Patient Education  2024 [employer].

## 2023-06-09 LAB — CANCER ANTIGEN 27.29: CA 27.29: 66.9 U/mL — ABNORMAL HIGH (ref 0.0–38.6)

## 2023-06-23 ENCOUNTER — Ambulatory Visit (HOSPITAL_COMMUNITY)
Admission: RE | Admit: 2023-06-23 | Discharge: 2023-06-23 | Disposition: A | Discharge status: Home / Self Care | Payer: Medicare HMO | Source: Ambulatory Visit | Attending: Medical Oncology | Admitting: Medical Oncology

## 2023-06-23 DIAGNOSIS — C7951 Secondary malignant neoplasm of bone: Secondary | ICD-10-CM | POA: Insufficient documentation

## 2023-06-23 DIAGNOSIS — Z170 Estrogen receptor positive status [ER+]: Secondary | ICD-10-CM | POA: Diagnosis not present

## 2023-06-23 DIAGNOSIS — C50011 Malignant neoplasm of nipple and areola, right female breast: Secondary | ICD-10-CM

## 2023-06-23 DIAGNOSIS — C50919 Malignant neoplasm of unspecified site of unspecified female breast: Secondary | ICD-10-CM

## 2023-06-23 DIAGNOSIS — K8020 Calculus of gallbladder without cholecystitis without obstruction: Secondary | ICD-10-CM | POA: Diagnosis not present

## 2023-06-23 DIAGNOSIS — C50012 Malignant neoplasm of nipple and areola, left female breast: Secondary | ICD-10-CM | POA: Diagnosis not present

## 2023-06-23 MED ORDER — TECHNETIUM TC 99M MEDRONATE IV KIT
20.0000 | PACK | Freq: Once | INTRAVENOUS | Status: AC | PRN
  Administered 2023-06-23: 18.5 via INTRAVENOUS

## 2023-06-23 MED ORDER — IOHEXOL 300 MG/ML  SOLN
100.0000 mL | Freq: Once | INTRAMUSCULAR | Status: AC | PRN
  Administered 2023-06-23: 100 mL via INTRAVENOUS

## 2023-06-23 MED ORDER — IOHEXOL 300 MG/ML  SOLN: 100.0000 mL | Freq: Once | INTRAMUSCULAR | Status: DC | PRN

## 2023-06-23 MED ORDER — HEPARIN SOD (PORK) LOCK FLUSH 100 UNIT/ML IV SOLN
500.0000 [IU] | Freq: Once | INTRAVENOUS | Status: AC
  Administered 2023-06-23: 500 [IU] via INTRAVENOUS

## 2023-06-28 ENCOUNTER — Other Ambulatory Visit: Payer: Self-pay

## 2023-06-28 ENCOUNTER — Other Ambulatory Visit: Payer: Self-pay | Admitting: Hematology & Oncology

## 2023-06-28 MED ORDER — IBRANCE 100 MG PO TABS
ORAL_TABLET | ORAL | 0 refills | Status: DC
  Filled 2023-06-28: qty 21, 28d supply, fill #0

## 2023-06-28 NOTE — Progress Notes (Signed)
Specialty Pharmacy Refill Coordination Note  Caitlin Greene is a 58 y.o. female contacted today regarding refills of specialty medication(s) Palbociclib [name]   Patient requested Delivery   Delivery date: 07/06/23   Verified address: [address]  [place] [address]   Medication will be filled on 07/05/23.

## 2023-06-28 NOTE — Progress Notes (Signed)
Specialty Pharmacy Ongoing Clinical Assessment Note  Caitlin Greene is a 58 y.o. female who is being followed by the specialty pharmacy service for [facility] Oncology   Patient's specialty medication(s) reviewed today: Palbociclib [name]   Missed doses in the last 4 weeks: 0   Patient/Caregiver did not have any additional questions or concerns.   Therapeutic benefit summary: Patient is achieving benefit   Adverse events/side effects summary: No adverse events/side effects   Patient's therapy is appropriate to: Continue    Goals Addressed             This Visit's Progress    Slow Disease Progression       Patient is on track. Patient will maintain adherence. Per 06/08/23 provider note patient remains stable.          Follow up:  6 months  Otto Herb Specialty Pharmacist

## 2023-06-29 ENCOUNTER — Other Ambulatory Visit: Payer: Self-pay | Admitting: Internal Medicine

## 2023-06-29 ENCOUNTER — Other Ambulatory Visit: Payer: Self-pay

## 2023-07-03 ENCOUNTER — Encounter: Payer: Self-pay | Admitting: Medical Oncology

## 2023-07-05 ENCOUNTER — Other Ambulatory Visit: Payer: Self-pay

## 2023-07-06 ENCOUNTER — Other Ambulatory Visit: Payer: Self-pay | Admitting: *Deleted

## 2023-07-06 ENCOUNTER — Other Ambulatory Visit: Payer: Self-pay

## 2023-07-06 DIAGNOSIS — C50919 Malignant neoplasm of unspecified site of unspecified female breast: Secondary | ICD-10-CM

## 2023-07-06 DIAGNOSIS — C7951 Secondary malignant neoplasm of bone: Secondary | ICD-10-CM

## 2023-07-06 DIAGNOSIS — Z95828 Presence of other vascular implants and grafts: Secondary | ICD-10-CM

## 2023-07-06 DIAGNOSIS — D510 Vitamin B12 deficiency anemia due to intrinsic factor deficiency: Secondary | ICD-10-CM

## 2023-07-06 DIAGNOSIS — D500 Iron deficiency anemia secondary to blood loss (chronic): Secondary | ICD-10-CM

## 2023-07-06 MED ORDER — PANTOPRAZOLE SODIUM 40 MG PO TBEC: 40.0000 mg | DELAYED_RELEASE_TABLET | Freq: Two times a day (BID) | ORAL | 5 refills | Status: AC

## 2023-07-07 ENCOUNTER — Inpatient Hospital Stay: Payer: Medicare HMO

## 2023-07-07 ENCOUNTER — Inpatient Hospital Stay (HOSPITAL_BASED_OUTPATIENT_CLINIC_OR_DEPARTMENT_OTHER): Payer: Medicare HMO | Admitting: Hematology & Oncology

## 2023-07-07 ENCOUNTER — Encounter: Payer: Self-pay | Admitting: *Deleted

## 2023-07-07 ENCOUNTER — Inpatient Hospital Stay: Payer: Medicare HMO | Attending: Hematology & Oncology

## 2023-07-07 VITALS — BP 128/90 | HR 94 | Temp 97.4°F | Resp 17 | Wt 201.1 lb

## 2023-07-07 DIAGNOSIS — Z923 Personal history of irradiation: Secondary | ICD-10-CM | POA: Diagnosis not present

## 2023-07-07 DIAGNOSIS — C7951 Secondary malignant neoplasm of bone: Secondary | ICD-10-CM

## 2023-07-07 DIAGNOSIS — Z95828 Presence of other vascular implants and grafts: Secondary | ICD-10-CM

## 2023-07-07 DIAGNOSIS — D509 Iron deficiency anemia, unspecified: Secondary | ICD-10-CM | POA: Diagnosis not present

## 2023-07-07 DIAGNOSIS — Z1722 Progesterone receptor negative status: Secondary | ICD-10-CM | POA: Insufficient documentation

## 2023-07-07 DIAGNOSIS — C50919 Malignant neoplasm of unspecified site of unspecified female breast: Secondary | ICD-10-CM

## 2023-07-07 DIAGNOSIS — Z170 Estrogen receptor positive status [ER+]: Secondary | ICD-10-CM | POA: Diagnosis not present

## 2023-07-07 DIAGNOSIS — Z1732 Human epidermal growth factor receptor 2 negative status: Secondary | ICD-10-CM | POA: Insufficient documentation

## 2023-07-07 DIAGNOSIS — K922 Gastrointestinal hemorrhage, unspecified: Secondary | ICD-10-CM

## 2023-07-07 DIAGNOSIS — Z5111 Encounter for antineoplastic chemotherapy: Secondary | ICD-10-CM | POA: Diagnosis not present

## 2023-07-07 DIAGNOSIS — D500 Iron deficiency anemia secondary to blood loss (chronic): Secondary | ICD-10-CM

## 2023-07-07 DIAGNOSIS — Z79899 Other long term (current) drug therapy: Secondary | ICD-10-CM | POA: Insufficient documentation

## 2023-07-07 DIAGNOSIS — D510 Vitamin B12 deficiency anemia due to intrinsic factor deficiency: Secondary | ICD-10-CM

## 2023-07-07 LAB — CMP (CANCER CENTER ONLY)
ALT: 11 U/L (ref 0–44)
AST: 12 U/L — ABNORMAL LOW (ref 15–41)
Albumin: 4.2 g/dL (ref 3.5–5.0)
Alkaline Phosphatase: 50 U/L (ref 38–126)
Anion gap: 7 (ref 5–15)
BUN: 14 mg/dL (ref 6–20)
CO2: 26 mmol/L (ref 22–32)
Calcium: 9 mg/dL (ref 8.9–10.3)
Chloride: 107 mmol/L (ref 98–111)
Creatinine: 0.89 mg/dL (ref 0.44–1.00)
GFR, Estimated: >60 mL/min (ref >60)
Glucose, Bld: 92 mg/dL (ref 70–99)
Potassium: 3.9 mmol/L (ref 3.5–5.1)
Sodium: 140 mmol/L (ref 135–145)
Total Bilirubin: 0.4 mg/dL (ref 0.0–1.2)
Total Protein: 6.6 g/dL (ref 6.5–8.1)

## 2023-07-07 LAB — CBC WITH DIFFERENTIAL (CANCER CENTER ONLY)
Abs Immature Granulocytes: 0.01 10*3/uL (ref 0.00–0.07)
Basophils Absolute: 0.1 10*3/uL (ref 0.0–0.1)
Basophils Relative: 2 %
Eosinophils Absolute: 0.1 10*3/uL (ref 0.0–0.5)
Eosinophils Relative: 3 %
HCT: 32.9 % — ABNORMAL LOW (ref 36.0–46.0)
Hemoglobin: 11.6 g/dL — ABNORMAL LOW (ref 12.0–15.0)
Immature Granulocytes: 0 %
Lymphocytes Relative: 35 %
Lymphs Abs: 0.8 10*3/uL (ref 0.7–4.0)
MCH: 37.2 pg — ABNORMAL HIGH (ref 26.0–34.0)
MCHC: 35.3 g/dL (ref 30.0–36.0)
MCV: 105.4 fL — ABNORMAL HIGH (ref 80.0–100.0)
Monocytes Absolute: 0.4 10*3/uL (ref 0.1–1.0)
Monocytes Relative: 18 %
Neutro Abs: 0.9 10*3/uL — ABNORMAL LOW (ref 1.7–7.7)
Neutrophils Relative %: 42 %
Platelet Count: 136 10*3/uL — ABNORMAL LOW (ref 150–400)
RBC: 3.12 MIL/uL — ABNORMAL LOW (ref 3.87–5.11)
RDW: 14.4 % (ref 11.5–15.5)
Smear Review: NORMAL
WBC Count: 2.2 10*3/uL — ABNORMAL LOW (ref 4.0–10.5)
nRBC: 0 % (ref 0.0–0.2)

## 2023-07-07 LAB — IRON AND IRON BINDING CAPACITY (CC-WL,HP ONLY)
Iron: 80 ug/dL (ref 28–170)
Saturation Ratios: 26 % (ref 10.4–31.8)
TIBC: 311 ug/dL (ref 250–450)
UIBC: 231 ug/dL (ref 148–442)

## 2023-07-07 LAB — VITAMIN B12: Vitamin B-12: 162 pg/mL — ABNORMAL LOW (ref 180–914)

## 2023-07-07 LAB — FOLATE: Folate: 7.7 ng/mL (ref >5.9)

## 2023-07-07 LAB — FERRITIN: Ferritin: 92 ng/mL (ref 11–307)

## 2023-07-07 MED ORDER — FULVESTRANT 250 MG/5ML IM SOSY
500.0000 mg | PREFILLED_SYRINGE | INTRAMUSCULAR | Status: DC
Start: 2023-07-07 — End: 2023-07-07
  Administered 2023-07-07: 500 mg via INTRAMUSCULAR
  Filled 2023-07-07: qty 10

## 2023-07-07 MED ORDER — SODIUM CHLORIDE 0.9% FLUSH
10.0000 mL | INTRAVENOUS | Status: DC | PRN
  Administered 2023-07-07: 10 mL via INTRAVENOUS

## 2023-07-07 MED ORDER — HEPARIN SOD (PORK) LOCK FLUSH 100 UNIT/ML IV SOLN
500.0000 [IU] | Freq: Once | INTRAVENOUS | Status: AC
  Administered 2023-07-07: 500 [IU] via INTRAVENOUS

## 2023-07-07 NOTE — Progress Notes (Signed)
 Hematology and Oncology Follow Up Visit  Caitlin Greene [id number] 17-Sep-1965 58 y.o. 07/07/2023   Principle Diagnosis:  Metastatic breast cancer-ER positive, PR negative/HER-2 negative --bone metastasis only Iron deficiency anemia  Current Therapy:   Faslodex 500 mg IM monthly --start on 04/2020 Ibrance 100 mg p.o. daily (21d on/7d off) - start on 04/2020 Xgeva 120 mg subcu every 3 months -next dose 08/2023  IV iron-Venofer given on 10/21/2022   Status post XRT for right sacral lesion -completed on 03/03/2023 -- 3000 rad     Interim History:  Caitlin Greene is in for follow-up.  She feels okay.  She was had no complaints since we last saw her.  She is still taking the Ibrance.  She has had no problems with the Ibrance..  She has had scans done.  He had a CT scan on her on 06/23/2023.  CT scan shows stable.  She also had a bone scan that was done.  Bone scan also showed everything to be stable.  Her last CA 27.29 was up to 67.  Will have to watch this closely.  She has had no change in bowel bladder habits.  She has had no cough or shortness of breath.  Her appetite has been good.  She tried to lose little weight.  She has had no rashes.  There has been no bleeding.  Her last iron studies that were done January showed a ferritin of 79 with iron saturation of 29%.  Overall, I would have to say that her performance status is probably ECOG 1.      Wt Readings from Last 3 Encounters:  07/07/23 201 lb 1.9 oz (91.2 kg)  06/08/23 199 lb (90.3 kg)  05/23/23 198 lb (89.8 kg)    Medications:  Current Outpatient Medications:    acetaminophen (TYLENOL) 650 MG CR tablet, Take 650 mg by mouth every 8 (eight) hours as needed for pain., Disp: , Rfl:    calcium carbonate (OS-CAL) 600 MG tablet, Take 1 tablet (600 mg total) by mouth 2 (two) times daily., Disp: 30 tablet, Rfl: 0   Ferrous Sulfate (IRON) 325 (65 Fe) MG TABS, Take 1 tablet (325 mg total) by mouth daily at 2 PM., Disp: 30 tablet, Rfl:  0   FLUoxetine (PROZAC) 20 MG capsule, TAKE 3 CAPSULES(60 MG) BY MOUTH DAILY, Disp: 270 capsule, Rfl: 3   fulvestrant (FASLODEX) 250 MG/5ML injection, Inject 500 mg into the muscle every 30 (thirty) days. One injection each buttock over 1-2 minutes. Warm prior to use., Disp: , Rfl:    IBRANCE 100 MG tablet, TAKE 1 TABLET DAILY ON DAYS 1 THROUGH 21 OF CYCLE, 7 DAYS OFF. REPEAT, Disp: 21 tablet, Rfl: 0   lidocaine-prilocaine (EMLA) cream, Apply 1 Application topically as needed., Disp: 30 g, Rfl: 0   meclizine (ANTIVERT) 12.5 MG tablet, Take 1 tablet (12.5 mg total) by mouth 3 (three) times daily as needed for dizziness., Disp: 40 tablet, Rfl: 1   oxyCODONE (OXY IR/ROXICODONE) 5 MG immediate release tablet, Take 1 tablet (5 mg total) by mouth every 8 (eight) hours as needed for severe pain., Disp: 50 tablet, Rfl: 0   pantoprazole (PROTONIX) 40 MG tablet, Take 1 tablet (40 mg total) by mouth 2 (two) times daily., Disp: 60 tablet, Rfl: 5   vitamin B-12 (CYANOCOBALAMIN) 1000 MCG tablet, Take 1 tablet (1,000 mcg total) by mouth daily., Disp: 30 tablet, Rfl: 0   Wheat Dextrin (BENEFIBER DRINK MIX PO), Take by mouth daily at 6 (six) AM.,  Disp: , Rfl:    LORazepam (ATIVAN) 1 MG tablet, Take 1 tablet (1 mg total) by mouth once as needed for up to 1 dose for anxiety. Take 1 tablet (1 mg total) by mouth 20 minutes before MRI scan. Please have a driver for this test as this medication has sedating effects. (Patient not taking: Reported on 07/07/2023), Disp: 1 tablet, Rfl: 0 No current facility-administered medications for this visit.  Facility-Administered Medications Ordered in Other Visits:    fulvestrant (FASLODEX) injection 500 mg, 500 mg, Intramuscular, Q30 days, Josph Macho, MD, 500 mg at 07/07/23 1114  Allergies:  Allergies  Allergen Reactions   Amoxicillin-Pot Clavulanate Hives and Itching   Iron Anaphylaxis and Other (See Comments)    Patient develops hypotension, resp distress after infusion of  feraheme.    Penicillins Hives, Itching and Other (See Comments)   Codeine Nausea And Vomiting    Needs pre-meds     Past Medical History, Surgical history, Social history, and Family History were reviewed and updated.  Review of Systems: Review of Systems  Constitutional:  Negative for appetite change.  HENT:  Negative.    Eyes: Negative.   Respiratory: Negative.    Cardiovascular: Negative.   Gastrointestinal:  Negative for abdominal pain and nausea.  Endocrine: Negative.   Genitourinary: Negative.    Musculoskeletal:  Positive for arthralgias and back pain.  Skin: Negative.   Neurological:  Negative for dizziness.  Hematological: Negative.   Psychiatric/Behavioral: Negative.      Physical Exam:  weight is 201 lb 1.9 oz (91.2 kg). Her oral temperature is 97.4 F (36.3 C) (abnormal). Her blood pressure is 128/90 (abnormal) and her pulse is 94. Her respiration is 17 and oxygen saturation is 96%.   Wt Readings from Last 3 Encounters:  07/07/23 201 lb 1.9 oz (91.2 kg)  06/08/23 199 lb (90.3 kg)  05/23/23 198 lb (89.8 kg)    Physical Exam Vitals reviewed.  HENT:     Head: Normocephalic and atraumatic.  Eyes:     Pupils: Pupils are equal, round, and reactive to light.  Cardiovascular:     Rate and Rhythm: Normal rate and regular rhythm.     Heart sounds: Normal heart sounds.  Pulmonary:     Effort: Pulmonary effort is normal.     Breath sounds: Normal breath sounds.  Abdominal:     General: Bowel sounds are normal.     Palpations: Abdomen is soft.  Musculoskeletal:        General: No tenderness or deformity. Normal range of motion.     Cervical back: Normal range of motion.  Lymphadenopathy:     Cervical: No cervical adenopathy.  Skin:    General: Skin is warm and dry.     Findings: No erythema or rash.  Neurological:     Mental Status: She is alert and oriented to person, place, and time.  Psychiatric:        Behavior: Behavior normal.        Thought  Content: Thought content normal.        Judgment: Judgment normal.      Lab Results  Component Value Date   WBC 2.2 (L) 07/07/2023   HGB 11.6 (L) 07/07/2023   HCT 32.9 (L) 07/07/2023   MCV 105.4 (H) 07/07/2023   PLT 136 (L) 07/07/2023     Chemistry      Component Value Date/Time   NA 140 07/07/2023 1015   K 3.9 07/07/2023 1015   CL  107 07/07/2023 1015   CO2 26 07/07/2023 1015   BUN 14 07/07/2023 1015   CREATININE 0.89 07/07/2023 1015   CREATININE 0.88 10/22/2019 1246      Component Value Date/Time   CALCIUM 9.0 07/07/2023 1015   ALKPHOS 50 07/07/2023 1015   AST 12 (L) 07/07/2023 1015   ALT 11 07/07/2023 1015   BILITOT 0.4 07/07/2023 1015      Impression and Plan: Ms. Heiny is a very charming 58 year old postmenopausal female with metastatic breast cancer.  We actually had seen her many years ago with a carcinoma in situ.  We will have to see what the CA 27.29 looks like.  Hopefully, it will be back down a little bit.  She will get her Faslodex today.  She is not due for the Xgeva till April.  Will plan to get her back in another month as always.

## 2023-07-07 NOTE — Patient Instructions (Signed)

## 2023-07-07 NOTE — Addendum Note (Signed)
 Addended by: Josph Macho on: 07/07/2023 05:17 PM   Modules accepted: Orders

## 2023-07-07 NOTE — Patient Instructions (Signed)
 Fulvestrant Injection What is this medication? FULVESTRANT (ful VES trant) treats breast cancer. It works by blocking the hormone estrogen in breast tissue, which prevents breast cancer cells from spreading or growing. This medicine may be used for other purposes; ask your health care provider or pharmacist if you have questions. COMMON BRAND NAME(S): FASLODEX What should I tell my care team before I take this medication? They need to know if you have any of these conditions: Bleeding disorder Liver disease Low blood cell levels (white cells, red cells, and platelets) An unusual or allergic reaction to fulvestrant, other medications, foods, dyes, or preservatives Pregnant or trying to get pregnant Breastfeeding How should I use this medication? This medication is injected into a muscle. It is given by your care team in a hospital or clinic setting. Talk to your care team about the use of this medication in children. Special care may be needed. Overdosage: If you think you have taken too much of this medicine contact a poison control center or emergency room at once. NOTE: This medicine is only for you. Do not share this medicine with others. What if I miss a dose? Keep appointments for follow-up doses. It is important not to miss your dose. Call your care team if you are unable to keep an appointment. What may interact with this medication? Fluoroestradiol F18 This list may not describe all possible interactions. Give your health care provider a list of all the medicines, herbs, non-prescription drugs, or dietary supplements you use. Also tell them if you smoke, drink alcohol, or use illegal drugs. Some items may interact with your medicine. What should I watch for while using this medication? Your condition will be monitored carefully while you are receiving this medication. You may need blood work done while you are taking this medication. This medication is injected into a muscle. Talk  to your care team if you also take medications that prevent or treat blood clots, such as warfarin. Blood thinners may increase the risk of bleeding or bruising in the muscle where this medication is injected. The benefits of this medication may outweigh the risks. Your care team can help you find the option that works for you. They can also help limit the risk of bleeding. Talk to your care team if you may be pregnant. Serious birth defects can occur if you take this medication during pregnancy and for 1 year after the last dose. You will need a negative pregnancy test before starting this medication. Contraception is recommended while taking this medication and for 1 year after the last dose. Your care team can help you find the option that works for you. Do not breastfeed while taking this medication and for 1 year after the last dose. This medication may cause infertility. Talk to your care team if you are concerned about your fertility. What side effects may I notice from receiving this medication? Side effects that you should report to your care team as soon as possible: Allergic reactions or angioedema--skin rash, itching or hives, swelling of the face, eyes, lips, tongue, arms, or legs, trouble swallowing or breathing Pain, tingling, or numbness in the hands or feet Side effects that usually do not require medical attention (report to your care team if they continue or are bothersome): Bone, joint, or muscle pain Constipation Headache Hot flashes Nausea Pain, redness, or irritation at injection site Unusual weakness or fatigue This list may not describe all possible side effects. Call your doctor for medical advice about side  effects. You may report side effects to FDA at 1-800-FDA-1088. Where should I keep my medication? This medication is given in a hospital or clinic. It will not be stored at home. NOTE: This sheet is a summary. It may not cover all possible information. If you have  questions about this medicine, talk to your doctor, pharmacist, or health care provider.  2024 Elsevier/Gold Standard (2022-12-30 00:00:00)

## 2023-07-08 LAB — CANCER ANTIGEN 27.29: CA 27.29: 57.8 U/mL — ABNORMAL HIGH (ref 0.0–38.6)

## 2023-07-10 ENCOUNTER — Encounter: Payer: Self-pay | Admitting: *Deleted

## 2023-07-11 ENCOUNTER — Ambulatory Visit (INDEPENDENT_AMBULATORY_CARE_PROVIDER_SITE_OTHER): Payer: Medicare HMO

## 2023-07-11 VITALS — BP 122/72 | HR 72 | Ht 66.5 in | Wt 204.4 lb

## 2023-07-11 DIAGNOSIS — Z0000 Encounter for general adult medical examination without abnormal findings: Secondary | ICD-10-CM | POA: Diagnosis not present

## 2023-07-11 NOTE — Patient Instructions (Addendum)
 Caitlin Greene , Thank you for taking time to come for your Medicare Wellness Visit. I appreciate your ongoing commitment to your health goals. Please review the following plan we discussed and let me know if I can assist you in the future.   Referrals/Orders/Follow-Ups/Clinician Recommendations: Aim for 30 minutes of exercise or brisk walking, 6-8 glasses of water, and 5 servings of fruits and vegetables each day. Recommended repeat Mammogram in 2025 at [facility] office.  Educated and advised on getting the Shingles and Influenza vaccines in 2025.  Managing Pain Without Opioids Opioids are strong medicines used to treat moderate to severe pain. For some people, especially those who have long-term (chronic) pain, opioids may not be the best choice for pain management due to: Side effects like nausea, constipation, and sleepiness. The risk of addiction (opioid use disorder). The longer you take opioids, the greater your risk of addiction. Pain that lasts for more than 3 months is called chronic pain. Managing chronic pain usually requires more than one approach and is often provided by a team of health care providers working together (multidisciplinary approach). Pain management may be done at a pain management center or pain clinic. How to manage pain without the use of opioids Use non-opioid medicines Non-opioid medicines for pain may include: Over-the-counter or prescription non-steroidal anti-inflammatory drugs (NSAIDs). These may be the first medicines used for pain. They work well for muscle and bone pain, and they reduce swelling. Acetaminophen. This over-the-counter medicine may work well for milder pain but not swelling. Antidepressants. These may be used to treat chronic pain. A certain type of antidepressant (tricyclics) is often used. These medicines are given in lower doses for pain than when used for depression. Anticonvulsants. These are usually used to treat seizures but may also reduce nerve  (neuropathic) pain. Muscle relaxants. These relieve pain caused by sudden muscle tightening (spasms). You may also use a pain medicine that is applied to the skin as a patch, cream, or gel (topical analgesic), such as a numbing medicine. These may cause fewer side effects than medicines taken by mouth. Do certain therapies as directed Some therapies can help with pain management. They include: Physical therapy. You will do exercises to gain strength and flexibility. A physical therapist may teach you exercises to move and stretch parts of your body that are weak, stiff, or painful. You can learn these exercises at physical therapy visits and practice them at home. Physical therapy may also involve: Massage. Heat wraps or applying heat or cold to affected areas. Electrical signals that interrupt pain signals (transcutaneous electrical nerve stimulation, TENS). Weak lasers that reduce pain and swelling (low-level laser therapy). Signals from your body that help you learn to regulate pain (biofeedback). Occupational therapy. This helps you to learn ways to function at home and work with less pain. Recreational therapy. This involves trying new activities or hobbies, such as a physical activity or drawing. Mental health therapy, including: Cognitive behavioral therapy (CBT). This helps you learn coping skills for dealing with pain. Acceptance and commitment therapy (ACT) to change the way you think and react to pain. Relaxation therapies, including muscle relaxation exercises and mindfulness-based stress reduction. Pain management counseling. This may be individual, family, or group counseling.  Receive medical treatments Medical treatments for pain management include: Nerve block injections. These may include a pain blocker and anti-inflammatory medicines. You may have injections: Near the spine to relieve chronic back or neck pain. Into joints to relieve back or joint pain. Into nerve areas  that supply a painful area to relieve body pain. Into muscles (trigger point injections) to relieve some painful muscle conditions. A medical device placed near your spine to help block pain signals and relieve nerve pain or chronic back pain (spinal cord stimulation device). Acupuncture. Follow these instructions at home Medicines Take over-the-counter and prescription medicines only as told by your health care provider. If you are taking pain medicine, ask your health care providers about possible side effects to watch out for. Do not drive or use heavy machinery while taking prescription opioid pain medicine. Lifestyle  Do not use drugs or alcohol to reduce pain. If you drink alcohol, limit how much you have to: 0-1 drink a day for women who are not pregnant. 0-2 drinks a day for men. Know how much alcohol is in a drink. In the [place] one drink equals one 12 oz bottle of beer (355 mL), one 5 oz glass of wine (148 mL), or one 1 oz glass of hard liquor (44 mL). Do not use any products that contain nicotine or tobacco. These products include cigarettes, chewing tobacco, and vaping devices, such as e-cigarettes. If you need help quitting, ask your health care provider. Eat a healthy diet and maintain a healthy weight. Poor diet and excess weight may make pain worse. Eat foods that are high in fiber. These include fresh fruits and vegetables, whole grains, and beans. Limit foods that are high in fat and processed sugars, such as fried and sweet foods. Exercise regularly. Exercise lowers stress and may help relieve pain. Ask your health care provider what activities and exercises are safe for you. If your health care provider approves, join an exercise class that combines movement and stress reduction. Examples include yoga and tai chi. Get enough sleep. Lack of sleep may make pain worse. Lower stress as much as possible. Practice stress reduction techniques as told by your therapist. General  instructions Work with all your pain management providers to find the treatments that work best for you. You are an important member of your pain management team. There are many things you can do to reduce pain on your own. Consider joining an online or in-person support group for people who have chronic pain. Keep all follow-up visits. This is important. Where to find more information You can find more information about managing pain without opioids from: American Academy of Pain Medicine: painmed.org Institute for Chronic Pain: instituteforchronicpain.org American Chronic Pain Association: theacpa.org Contact a health care provider if: You have side effects from pain medicine. Your pain gets worse or does not get better with treatments or home therapy. You are struggling with anxiety or depression. Summary Many types of pain can be managed without opioids. Chronic pain may respond better to pain management without opioids. Pain is best managed when you and a team of health care providers work together. Pain management without opioids may include non-opioid medicines, medical treatments, physical therapy, mental health therapy, and lifestyle changes. Tell your health care providers if your pain gets worse or is not being managed well enough. This information is not intended to replace advice given to you by your health care provider. Make sure you discuss any questions you have with your health care provider. Document Revised: 08/05/2020 Document Reviewed: 08/05/2020 Elsevier Patient Education  2024 [employer].  This is a list of the screening recommended for you and due dates:  Health Maintenance  Topic Date Due   Zoster (Shingles) Vaccine (1 of 2) Never done  Pap with HPV screening  08/08/2014   Mammogram  08/02/2023*   Flu Shot  08/07/2023*   Medicare Annual Wellness Visit  07/10/2024   DTaP/Tdap/Td vaccine (3 - Td or Tdap) 11/11/2026   Pneumococcal Vaccination (3 of 3 - PPSV23  or PCV20) 03/29/2027   Colon Cancer Screening  10/20/2031   Hepatitis C Screening  Completed   HIV Screening  Completed   HPV Vaccine  Aged Out   COVID-19 Vaccine  Discontinued  *Topic was postponed. The date shown is not the original due date.    Advanced directives: (Provided) Advance directive discussed with you today. I have provided a copy for you to complete at home and have notarized. Once this is complete, please bring a copy in to our office so we can scan it into your chart.   Next Medicare Annual Wellness Visit scheduled for next year: Yes - 2026

## 2023-07-11 NOTE — Progress Notes (Addendum)
 Subjective:   Caitlin Greene is a 58 y.o. who presents for a Medicare Wellness preventive visit.  Visit Complete: In person   AWV Questionnaire: No: Patient Medicare AWV questionnaire was not completed prior to this visit.  Cardiac Risk Factors include: advanced age (>57men, >84 women);dyslipidemia;obesity (BMI >30kg/m2)     Objective:    Today's Vitals   07/11/23 0905  BP: 122/72  Pulse: 72  SpO2: 97%  Weight: 204 lb 6.4 oz (92.7 kg)  Height: 5' 6.5" (1.689 m)  PainSc: 4   PainLoc: Back   Body mass index is 32.5 kg/m.     07/11/2023    9:04 AM 07/07/2023   10:27 AM 06/08/2023   10:32 AM 05/04/2023   10:26 AM 04/10/2023    3:20 PM 03/09/2023    8:50 AM 02/06/2023   12:49 PM  Advanced Directives  Does Patient Have a Medical Advance Directive? No No No No No No No  Does patient want to make changes to medical advance directive?   No - Patient declined No - Patient declined     Would patient like information on creating a medical advance directive? Yes (MAU/Ambulatory/Procedural Areas - Information given) No - Patient declined No - Patient declined No - Patient declined  No - Patient declined No - Patient declined    Current Medications (verified) Outpatient Encounter Medications as of 07/11/2023  Medication Sig   acetaminophen (TYLENOL) 650 MG CR tablet Take 650 mg by mouth every 8 (eight) hours as needed for pain.   calcium carbonate (OS-CAL) 600 MG tablet Take 1 tablet (600 mg total) by mouth 2 (two) times daily.   Ferrous Sulfate (IRON) 325 (65 Fe) MG TABS Take 1 tablet (325 mg total) by mouth daily at 2 PM.   FLUoxetine (PROZAC) 20 MG capsule TAKE 3 CAPSULES(60 MG) BY MOUTH DAILY   fulvestrant (FASLODEX) 250 MG/5ML injection Inject 500 mg into the muscle every 30 (thirty) days. One injection each buttock over 1-2 minutes. Warm prior to use.   IBRANCE 100 MG tablet TAKE 1 TABLET DAILY ON DAYS 1 THROUGH 21 OF CYCLE, 7 DAYS OFF. REPEAT   lidocaine-prilocaine (EMLA) cream  Apply 1 Application topically as needed.   LORazepam (ATIVAN) 1 MG tablet Take 1 tablet (1 mg total) by mouth once as needed for up to 1 dose for anxiety. Take 1 tablet (1 mg total) by mouth 20 minutes before MRI scan. Please have a driver for this test as this medication has sedating effects.   meclizine (ANTIVERT) 12.5 MG tablet Take 1 tablet (12.5 mg total) by mouth 3 (three) times daily as needed for dizziness.   oxyCODONE (OXY IR/ROXICODONE) 5 MG immediate release tablet Take 1 tablet (5 mg total) by mouth every 8 (eight) hours as needed for severe pain.   pantoprazole (PROTONIX) 40 MG tablet Take 1 tablet (40 mg total) by mouth 2 (two) times daily.   vitamin B-12 (CYANOCOBALAMIN) 1000 MCG tablet Take 1 tablet (1,000 mcg total) by mouth daily.   Wheat Dextrin (BENEFIBER DRINK MIX PO) Take by mouth daily at 6 (six) AM.   No facility-administered encounter medications on file as of 07/11/2023.    Allergies (verified) Amoxicillin-pot clavulanate, Iron, Penicillins, and Codeine   History: Past Medical History:  Diagnosis Date   ALLERGIC RHINITIS    ANEMIA-IRON DEFICIENCY    ANXIETY    BREAST CANCER, HX OF 01/21/2007   at 58yo   DEPRESSION    GERD    History of radiation  therapy    Spine-02/20/23-03/03/23- Dr. Antony Blackbird   HYPERLIPIDEMIA    Metastatic cancer to bone [facility]) dx'd 10/24/2019   recurrent breast ca   Pneumonia 02/2022   RENAL CALCULUS    rt breast ca dx'd 1988   Past Surgical History:  Procedure Laterality Date   BIOPSY  03/21/2020   Procedure: BIOPSY;  Surgeon: Tressia Danas, MD;  Location: [facility];  Service: Gastroenterology;;   BIOPSY  09/13/2021   Procedure: BIOPSY;  Surgeon: Rachael Fee, MD;  Location: [facility];  Service: Gastroenterology;;   BIOPSY  10/19/2021   Procedure: BIOPSY;  Surgeon: Tressia Danas, MD;  Location: [facility];  Service: Gastroenterology;;   BREAST ENHANCEMENT SURGERY     COLONOSCOPY WITH PROPOFOL N/A 10/19/2021    Procedure: COLONOSCOPY WITH PROPOFOL;  Surgeon: Tressia Danas, MD;  Location: [facility];  Service: Gastroenterology;  Laterality: N/A;   ENTEROSCOPY N/A 10/19/2021   Procedure: ENTEROSCOPY;  Surgeon: Tressia Danas, MD;  Location: [facility];  Service: Gastroenterology;  Laterality: N/A;   ESOPHAGOGASTRODUODENOSCOPY N/A 10/16/2021   Procedure: ESOPHAGOGASTRODUODENOSCOPY (EGD);  Surgeon: Jenel Lucks, MD;  Location: [facility];  Service: Gastroenterology;  Laterality: N/A;   ESOPHAGOGASTRODUODENOSCOPY (EGD) WITH PROPOFOL N/A 03/21/2020   Procedure: ESOPHAGOGASTRODUODENOSCOPY (EGD) WITH PROPOFOL;  Surgeon: Tressia Danas, MD;  Location: [facility];  Service: Gastroenterology;  Laterality: N/A;   ESOPHAGOGASTRODUODENOSCOPY (EGD) WITH PROPOFOL N/A 09/13/2021   Procedure: ESOPHAGOGASTRODUODENOSCOPY (EGD) WITH PROPOFOL;  Surgeon: Rachael Fee, MD;  Location: [facility];  Service: Gastroenterology;  Laterality: N/A;   IR IMAGING GUIDED PORT INSERTION  10/04/2022   MASTECTOMY     right   POLYPECTOMY  10/19/2021   Procedure: POLYPECTOMY;  Surgeon: Tressia Danas, MD;  Location: [facility];  Service: Gastroenterology;;   TEMPOROMANDIBULAR JOINT SURGERY     Left   TONSILLECTOMY     Family History  Problem Relation Age of Onset   Colon polyps Mother    Cancer Mother        Uterine Cancer   Hypertension Mother    Dementia Mother    Colon polyps Father    COPD Father        smoked   Hypertension Other    Diabetes Other    Asthma Sister    Social History   Socioeconomic History   Marital status: Widowed    Spouse name: Not on file   Number of children: 0   Years of education: Not on file   Highest education level: Not on file  Occupational History   Occupation: BANKRUPTCY ANALYST    Employer: [employer]  Tobacco Use   Smoking status: Never    Passive exposure: Never   Smokeless tobacco: Never  Vaping Use   Vaping status: Never Used  Substance and  Sexual Activity   Alcohol use: No    Alcohol/week: 0.0 standard drinks of alcohol   Drug use: No   Sexual activity: Not Currently  Other Topics Concern   Not on file  Social History Narrative   Not on file   Social Drivers of Health   Financial Resource Strain: Low Risk  (07/11/2023)   Overall Financial Resource Strain (CARDIA)    Difficulty of Paying Living Expenses: Not hard at all  Food Insecurity: No Food Insecurity (07/11/2023)   Hunger Vital Sign    Worried About Running Out of Food in the Last Year: Never true    Ran Out of Food in the Last Year: Never true  Transportation Needs: No  Transportation Needs (07/11/2023)   PRAPARE - Administrator, Civil Service (Medical): No    Lack of Transportation (Non-Medical): No  Physical Activity: Insufficiently Active (07/11/2023)   Exercise Vital Sign    Days of Exercise per Week: 3 days    Minutes of Exercise per Session: 30 min  Stress: Stress Concern Present (07/11/2023)   [employer] of Occupational Health - Occupational Stress Questionnaire    Feeling of Stress : Rather much  Social Connections: Moderately Integrated (07/11/2023)   Social Connection and Isolation Panel [NHANES]    Frequency of Communication with Friends and Family: More than three times a week    Frequency of Social Gatherings with Friends and Family: More than three times a week    Attends Religious Services: More than 4 times per year    Active Member of [employer] or Organizations: Yes    Attends Banker Meetings: More than 4 times per year    Marital Status: Widowed    Tobacco Counseling - Non Smoker Counseling given - N/A   Clinical Intake: Completed 07/11/23  Pre-visit preparation completed: Yes  Pain : 0-10 (back) Pain Score: 4  Pain Type: Chronic pain Pain Location: Back Pain Orientation: Mid Pain Radiating Towards: mid area Pain Descriptors / Indicators: Constant Pain Onset: 1 to 4 weeks ago Pain Relieving Factors:  Hydrocodone Effect of Pain on Daily Activities: exercising/stretching  Pain Relieving Factors: Hydrocodone  BMI - recorded: 32.5 Nutritional Status: BMI > 30  Obese Nutritional Risks: None Diabetes: No  How often do you need to have someone help you when you read instructions, pamphlets, or other written materials from your doctor or pharmacy?: 1 - Never  Interpreter Needed?: No  Information entered by :: Hassell Halim, CMA   Activities of Daily Living Completed 07/11/2023    07/11/2023    9:09 AM 10/04/2022   12:51 PM  In your present state of health, do you have any difficulty performing the following activities:  Hearing? 0   Vision? 0   Difficulty concentrating or making decisions? 0   Walking or climbing stairs? 0 0  Dressing or bathing? 0   Doing errands, shopping? 0   Preparing Food and eating ? N   Using the Toilet? N   In the past six months, have you accidently leaked urine? Y   Comment wears a pad   Do you have problems with loss of bowel control? N   Managing your Medications? N   Managing your Finances? N   Housekeeping or managing your Housekeeping? N     Patient Care Team: Corwin Levins, MD as PCP - General Myna Hidalgo, Rose Phi, MD (Oncology) Gwendel Hanson, RN as Oncology Nurse Navigator  Indicate any recent Medical Services you may have received from other than [facility] providers in the past year (date may be approximate).     Assessment:   This is a routine wellness examination for [facility].  Hearing/Vision screen Hearing Screening - Comments:: Denies hearing difficulties   Vision Screening - Comments:: No difficulties with eye - does see Ophthalmologist yearly   Goals Addressed               This Visit's Progress     Patient Stated (pt-stated)        Patient stated she plans to eat healthier and lose about 20-40lbs.       Depression Screen Completed 07/11/23    07/11/2023    9:15 AM 11/07/2022  3:33 PM 09/30/2022    1:29 PM 08/02/2022    2:15  PM 08/02/2022    2:05 PM 06/25/2021    9:48 AM 06/25/2021    9:12 AM  PHQ 2/9 Scores  PHQ - 2 Score 0 1 0 0 0 0 0  PHQ- 9 Score 0  0  0      Fall Risk Completed 07/11/23    07/11/2023    9:10 AM 05/23/2023    3:23 PM 09/30/2022    1:29 PM 08/02/2022    2:05 PM 06/25/2021    9:48 AM  Fall Risk   Falls in the past year? 0 0 0 0 0  Number falls in past yr: 0 0 0 0 0  Injury with Fall? 0 0 0 0 0  Risk for fall due to : No Fall Risks  No Fall Risks No Fall Risks   Follow up Falls prevention discussed;Falls evaluation completed Falls evaluation completed Falls evaluation completed Falls evaluation completed;Education provided     MEDICARE RISK AT HOME: Completed 07/11/23 Medicare Risk at Home Any stairs in or around the home?: Yes (outside) If so, are there any without handrails?: No Home free of loose throw rugs in walkways, pet beds, electrical cords, etc?: Yes Adequate lighting in your home to reduce risk of falls?: Yes Life alert?: Yes (security system (CPI)) Use of a cane, walker or w/c?: No Grab bars in the bathroom?: Yes Shower chair or bench in shower?: Yes Elevated toilet seat or a handicapped toilet?: Yes  TIMED UP AND GO:  Was the test performed?  No  Cognitive Function: 6CIT completed        07/11/2023    9:11 AM  6CIT Screen  What Year? 0 points  What month? 0 points  What time? 0 points  Count back from 20 0 points  Months in reverse 0 points  Repeat phrase 0 points  Total Score 0 points    Immunizations Immunization History  Administered Date(s) Administered   Influenza Split 03/17/2011   Influenza Whole 03/02/2009   Influenza, Seasonal, Injecte, Preservative Fre 04/07/2013   Influenza,inj,Quad PF,6+ Mos 04/08/2014, 01/22/2015, 03/23/2017, 02/25/2020, 03/27/2020   Influenza-Unspecified 02/19/2016, 03/23/2018   PFIZER(Purple Top)SARS-COV-2 Vaccination 07/29/2019, 08/26/2019, 02/07/2020   Pfizer Covid-19 Vaccine Bivalent Booster 80yrs & up 03/15/2021    Pneumococcal Conjugate-13 07/08/2014   Pneumococcal Polysaccharide-23 03/28/2022   Td 05/31/2010   Tdap 11/10/2016    Screening Tests Health Maintenance  Topic Date Due   Zoster Vaccines- Shingrix (1 of 2) Never done   Cervical Cancer Screening (HPV/Pap Cotest)  08/08/2014   MAMMOGRAM  08/02/2023 (Originally 01/08/2014)   INFLUENZA VACCINE  08/07/2023 (Originally 12/08/2022)   Medicare Annual Wellness (AWV)  07/10/2024   DTaP/Tdap/Td (3 - Td or Tdap) 11/11/2026   Pneumococcal Vaccine 70-26 Years old (3 of 3 - PPSV23 or PCV20) 03/29/2027   Colonoscopy  10/20/2031   Hepatitis C Screening  Completed   HIV Screening  Completed   HPV VACCINES  Aged Out   COVID-19 Vaccine  Discontinued    Health Maintenance  Health Maintenance Due  Topic Date Due   Zoster Vaccines- Shingrix (1 of 2) Never done   Cervical Cancer Screening (HPV/Pap Cotest)  08/08/2014   Health Maintenance Items Addressed:07/11/23 - pt will get Shingles and Influenza vaccines in 2025 at local pharmacy.   Additional Screening:  Vision Screening: Recommended annual ophthalmology exams for early detection of glaucoma and other disorders of the eye. Pt stated she does see  an Ophthalmologist yearly for a routine eye exam.  Dental Screening: Recommended annual dental exams for proper oral hygiene  Community Resource Referral / Chronic Care Management: CRR required this visit?  No   CCM required this visit?  No     Plan:     I have personally reviewed and noted the following in the patient's chart:   Medical and social history Use of alcohol, tobacco or illicit drugs  Current medications and supplements including opioid prescriptions. Patient is currently taking opioid prescriptions. Information provided to patient regarding non-opioid alternatives. Patient advised to discuss non-opioid treatment plan with their provider. Functional ability and status Nutritional status Physical activity Advanced directives List  of other physicians Hospitalizations, surgeries, and ER visits in previous 12 months Vitals Screenings to include cognitive, depression, and falls Referrals and appointments  In addition, I have reviewed and discussed with patient certain preventive protocols, quality metrics, and best practice recommendations. A written personalized care plan for preventive services as well as general preventive health recommendations were provided to patient.     Darreld Mclean, CMA   07/11/2023   After Visit Summary: (MyChart) Due to this being a telephonic visit, the after visit summary with patients personalized plan was offered to patient via MyChart   Notes:  Pt stated will schedule a repeat Mammogram with at GYN office.

## 2023-07-17 ENCOUNTER — Telehealth: Payer: Self-pay | Admitting: Hematology & Oncology

## 2023-07-17 NOTE — Telephone Encounter (Signed)
 Called to schedule weekly b12 injections per inbasket. LVM to return call for scheduling.

## 2023-07-24 DIAGNOSIS — F325 Major depressive disorder, single episode, in full remission: Secondary | ICD-10-CM | POA: Diagnosis not present

## 2023-07-24 DIAGNOSIS — F411 Generalized anxiety disorder: Secondary | ICD-10-CM | POA: Diagnosis not present

## 2023-07-24 DIAGNOSIS — Z6830 Body mass index (BMI) 30.0-30.9, adult: Secondary | ICD-10-CM | POA: Diagnosis not present

## 2023-07-24 DIAGNOSIS — Z888 Allergy status to other drugs, medicaments and biological substances status: Secondary | ICD-10-CM | POA: Diagnosis not present

## 2023-07-24 DIAGNOSIS — I499 Cardiac arrhythmia, unspecified: Secondary | ICD-10-CM | POA: Diagnosis not present

## 2023-07-24 DIAGNOSIS — Z8249 Family history of ischemic heart disease and other diseases of the circulatory system: Secondary | ICD-10-CM | POA: Diagnosis not present

## 2023-07-24 DIAGNOSIS — Z833 Family history of diabetes mellitus: Secondary | ICD-10-CM | POA: Diagnosis not present

## 2023-07-24 DIAGNOSIS — M545 Low back pain, unspecified: Secondary | ICD-10-CM | POA: Diagnosis not present

## 2023-07-24 DIAGNOSIS — K219 Gastro-esophageal reflux disease without esophagitis: Secondary | ICD-10-CM | POA: Diagnosis not present

## 2023-07-24 DIAGNOSIS — C50919 Malignant neoplasm of unspecified site of unspecified female breast: Secondary | ICD-10-CM | POA: Diagnosis not present

## 2023-07-24 DIAGNOSIS — E669 Obesity, unspecified: Secondary | ICD-10-CM | POA: Diagnosis not present

## 2023-07-24 DIAGNOSIS — K5900 Constipation, unspecified: Secondary | ICD-10-CM | POA: Diagnosis not present

## 2023-07-27 ENCOUNTER — Other Ambulatory Visit: Payer: Self-pay

## 2023-07-27 ENCOUNTER — Other Ambulatory Visit (HOSPITAL_COMMUNITY): Payer: Self-pay

## 2023-07-27 ENCOUNTER — Other Ambulatory Visit: Payer: Self-pay | Admitting: Hematology & Oncology

## 2023-07-27 MED ORDER — IBRANCE 100 MG PO TABS
ORAL_TABLET | ORAL | 0 refills | Status: DC
  Filled 2023-07-27: qty 21, 28d supply, fill #0

## 2023-07-27 NOTE — Progress Notes (Signed)
 Specialty Pharmacy Refill Coordination Note  Caitlin Greene is a 58 y.o. female contacted today regarding refills of specialty medication(s) Palbociclib [name]   Patient requested (Patient-Rptd) Delivery   Delivery date: (Patient-Rptd) 08/04/23   Verified address: (Patient-Rptd) [address] [place] [address]   Medication will be filled on 03.27.25.   This fill date is pending response to refill request from provider. Patient is aware and if they have not received fill by intended date they must follow up with pharmacy.

## 2023-07-28 ENCOUNTER — Inpatient Hospital Stay: Attending: Hematology & Oncology

## 2023-07-28 VITALS — BP 123/89 | HR 91 | Temp 97.0°F | Resp 19

## 2023-07-28 DIAGNOSIS — D509 Iron deficiency anemia, unspecified: Secondary | ICD-10-CM | POA: Insufficient documentation

## 2023-07-28 DIAGNOSIS — E538 Deficiency of other specified B group vitamins: Secondary | ICD-10-CM | POA: Insufficient documentation

## 2023-07-28 DIAGNOSIS — Z86000 Personal history of in-situ neoplasm of breast: Secondary | ICD-10-CM | POA: Diagnosis not present

## 2023-07-28 DIAGNOSIS — Z5111 Encounter for antineoplastic chemotherapy: Secondary | ICD-10-CM | POA: Insufficient documentation

## 2023-07-28 DIAGNOSIS — Z923 Personal history of irradiation: Secondary | ICD-10-CM | POA: Insufficient documentation

## 2023-07-28 DIAGNOSIS — C50919 Malignant neoplasm of unspecified site of unspecified female breast: Secondary | ICD-10-CM | POA: Insufficient documentation

## 2023-07-28 DIAGNOSIS — Z79899 Other long term (current) drug therapy: Secondary | ICD-10-CM | POA: Diagnosis not present

## 2023-07-28 DIAGNOSIS — C7951 Secondary malignant neoplasm of bone: Secondary | ICD-10-CM | POA: Diagnosis not present

## 2023-07-28 MED ORDER — CYANOCOBALAMIN 1000 MCG/ML IJ SOLN
1000.0000 ug | INTRAMUSCULAR | Status: DC
Start: 2023-07-28 — End: 2023-07-28
  Administered 2023-07-28: 1000 ug via INTRAMUSCULAR
  Filled 2023-07-28: qty 1

## 2023-07-28 NOTE — Patient Instructions (Signed)
 Vitamin B12 Deficiency Vitamin B12 deficiency means that your body does not have enough vitamin B12. The body needs this important vitamin: To make red blood cells. To make genes (DNA). To help the nerves work. If you do not have enough vitamin B12 in your body, you can have health problems, such as not having enough red blood cells in the blood (anemia). What are the causes? Not eating enough foods that contain vitamin B12. Not being able to take in (absorb) vitamin B12 from the food that you eat. Certain diseases. A condition in which the body does not make enough of a certain protein. This results in your body not taking in enough vitamin B12. Having a surgery in which part of the stomach or small intestine is taken out. Taking medicines that make it hard for the body to take in vitamin B12. These include: Heartburn medicines. Some medicines that are used to treat diabetes. What increases the risk? Being an older adult. Eating a vegetarian or vegan diet that does not include any foods that come from animals. Not eating enough foods that contain vitamin B12 while you are pregnant. Taking certain medicines. Having alcoholism. What are the signs or symptoms? In some cases, there are no symptoms. If the condition leads to too few blood cells or nerve damage, symptoms can occur, such as: Feeling weak or tired. Not being hungry. Losing feeling (numbness) or tingling in your hands and feet. Redness and burning of the tongue. Feeling sad (depressed). Confusion or memory problems. Trouble walking. If anemia is very bad, symptoms can include: Being short of breath. Being dizzy. Having a very fast heartbeat. How is this treated? Changing the way you eat and drink, such as: Eating more foods that contain vitamin B12. Drinking little or no alcohol. Getting vitamin B12 shots. Taking vitamin B12 supplements by mouth (orally). Your doctor will tell you the dose that is best for you. Follow  these instructions at home: Eating and drinking  Eat foods that come from animals and have a lot of vitamin B12 in them. These include: Meats and poultry. This includes beef, pork, chicken, [place], and organ meats, such as liver. Seafood, such as clams, rainbow trout, salmon, tuna, and haddock. Eggs. Dairy foods such as milk, yogurt, and cheese. Eat breakfast cereals that have vitamin B12 added to them (are fortified). Check the label. The items listed above may not be a complete list of foods and beverages you can eat and drink. Contact a dietitian for more information. Alcohol use Do not drink alcohol if: Your doctor tells you not to drink. You are pregnant, may be pregnant, or are planning to become pregnant. If you drink alcohol: Limit how much you have to: 0-1 drink a day for women. 0-2 drinks a day for men. Know how much alcohol is in your drink. In the [place] one drink equals one 12 oz bottle of beer (355 mL), one 5 oz glass of wine (148 mL), or one 1 oz glass of hard liquor (44 mL). General instructions Get any vitamin B12 shots if told by your doctor. Take supplements only as told by your doctor. Follow the directions. Keep all follow-up visits. Contact a doctor if: Your symptoms come back. Your symptoms get worse or do not get better with treatment. Get help right away if: You have trouble breathing. You have a very fast heartbeat. You have chest pain. You get dizzy. You faint. These symptoms may be an emergency. Get help right away. Call 911.  Do not wait to see if the symptoms will go away. Do not drive yourself to the hospital. Summary Vitamin B12 deficiency means that your body is not getting enough of the vitamin. In some cases, there are no symptoms of this condition. Treatment may include making a change in the way you eat and drink, getting shots, or taking supplements. Eat foods that have vitamin B12 in them. This information is not intended to replace advice  given to you by your health care provider. Make sure you discuss any questions you have with your health care provider. Document Revised: 12/18/2020 Document Reviewed: 12/18/2020 Elsevier Patient Education  2024 [employer].

## 2023-08-04 ENCOUNTER — Inpatient Hospital Stay: Payer: Medicare HMO

## 2023-08-04 ENCOUNTER — Inpatient Hospital Stay: Payer: Medicare HMO | Admitting: Hematology & Oncology

## 2023-08-04 ENCOUNTER — Encounter: Payer: Self-pay | Admitting: Hematology & Oncology

## 2023-08-04 ENCOUNTER — Other Ambulatory Visit: Payer: Self-pay

## 2023-08-04 VITALS — Ht 68.0 in | Wt 201.0 lb

## 2023-08-04 VITALS — BP 112/66 | HR 76 | Temp 97.6°F | Resp 18

## 2023-08-04 DIAGNOSIS — C50919 Malignant neoplasm of unspecified site of unspecified female breast: Secondary | ICD-10-CM | POA: Diagnosis not present

## 2023-08-04 DIAGNOSIS — K921 Melena: Secondary | ICD-10-CM

## 2023-08-04 DIAGNOSIS — Z923 Personal history of irradiation: Secondary | ICD-10-CM | POA: Diagnosis not present

## 2023-08-04 DIAGNOSIS — Z79899 Other long term (current) drug therapy: Secondary | ICD-10-CM | POA: Diagnosis not present

## 2023-08-04 DIAGNOSIS — D519 Vitamin B12 deficiency anemia, unspecified: Secondary | ICD-10-CM

## 2023-08-04 DIAGNOSIS — C7951 Secondary malignant neoplasm of bone: Secondary | ICD-10-CM

## 2023-08-04 DIAGNOSIS — Z86000 Personal history of in-situ neoplasm of breast: Secondary | ICD-10-CM | POA: Diagnosis not present

## 2023-08-04 DIAGNOSIS — Z5111 Encounter for antineoplastic chemotherapy: Secondary | ICD-10-CM | POA: Diagnosis not present

## 2023-08-04 DIAGNOSIS — D510 Vitamin B12 deficiency anemia due to intrinsic factor deficiency: Secondary | ICD-10-CM

## 2023-08-04 DIAGNOSIS — D509 Iron deficiency anemia, unspecified: Secondary | ICD-10-CM | POA: Diagnosis not present

## 2023-08-04 DIAGNOSIS — E538 Deficiency of other specified B group vitamins: Secondary | ICD-10-CM | POA: Diagnosis not present

## 2023-08-04 DIAGNOSIS — K922 Gastrointestinal hemorrhage, unspecified: Secondary | ICD-10-CM

## 2023-08-04 LAB — CBC WITH DIFFERENTIAL (CANCER CENTER ONLY)
Abs Immature Granulocytes: 0 10*3/uL (ref 0.00–0.07)
Basophils Absolute: 0 10*3/uL (ref 0.0–0.1)
Basophils Relative: 1 %
Eosinophils Absolute: 0.1 10*3/uL (ref 0.0–0.5)
Eosinophils Relative: 3 %
HCT: 31.5 % — ABNORMAL LOW (ref 36.0–46.0)
Hemoglobin: 11.1 g/dL — ABNORMAL LOW (ref 12.0–15.0)
Immature Granulocytes: 0 %
Lymphocytes Relative: 26 %
Lymphs Abs: 0.5 10*3/uL — ABNORMAL LOW (ref 0.7–4.0)
MCH: 37.2 pg — ABNORMAL HIGH (ref 26.0–34.0)
MCHC: 35.2 g/dL (ref 30.0–36.0)
MCV: 105.7 fL — ABNORMAL HIGH (ref 80.0–100.0)
Monocytes Absolute: 0.2 10*3/uL (ref 0.1–1.0)
Monocytes Relative: 9 %
Neutro Abs: 1.1 10*3/uL — ABNORMAL LOW (ref 1.7–7.7)
Neutrophils Relative %: 61 %
Platelet Count: 115 10*3/uL — ABNORMAL LOW (ref 150–400)
RBC: 2.98 MIL/uL — ABNORMAL LOW (ref 3.87–5.11)
RDW: 14 % (ref 11.5–15.5)
WBC Count: 1.7 10*3/uL — ABNORMAL LOW (ref 4.0–10.5)
nRBC: 0 % (ref 0.0–0.2)

## 2023-08-04 LAB — CMP (CANCER CENTER ONLY)
ALT: 11 U/L (ref 0–44)
AST: 12 U/L — ABNORMAL LOW (ref 15–41)
Albumin: 4.1 g/dL (ref 3.5–5.0)
Alkaline Phosphatase: 44 U/L (ref 38–126)
Anion gap: 7 (ref 5–15)
BUN: 13 mg/dL (ref 6–20)
CO2: 27 mmol/L (ref 22–32)
Calcium: 8.9 mg/dL (ref 8.9–10.3)
Chloride: 107 mmol/L (ref 98–111)
Creatinine: 0.94 mg/dL (ref 0.44–1.00)
GFR, Estimated: >60 mL/min (ref >60)
Glucose, Bld: 91 mg/dL (ref 70–99)
Potassium: 4 mmol/L (ref 3.5–5.1)
Sodium: 141 mmol/L (ref 135–145)
Total Bilirubin: 0.6 mg/dL (ref 0.0–1.2)
Total Protein: 6.1 g/dL — ABNORMAL LOW (ref 6.5–8.1)

## 2023-08-04 LAB — IRON AND IRON BINDING CAPACITY (CC-WL,HP ONLY)
Iron: 124 ug/dL (ref 28–170)
Saturation Ratios: 41 % — ABNORMAL HIGH (ref 10.4–31.8)
TIBC: 302 ug/dL (ref 250–450)
UIBC: 178 ug/dL (ref 148–442)

## 2023-08-04 LAB — FOLATE: Folate: 7.2 ng/mL (ref >5.9)

## 2023-08-04 LAB — FERRITIN: Ferritin: 78 ng/mL (ref 11–307)

## 2023-08-04 LAB — VITAMIN B12: Vitamin B-12: 314 pg/mL (ref 180–914)

## 2023-08-04 MED ORDER — CYANOCOBALAMIN 1000 MCG/ML IJ SOLN
1000.0000 ug | Freq: Once | INTRAMUSCULAR | Status: AC
  Administered 2023-08-04: 1000 ug via INTRAMUSCULAR
  Filled 2023-08-04: qty 1

## 2023-08-04 MED ORDER — FULVESTRANT 250 MG/5ML IM SOSY
500.0000 mg | PREFILLED_SYRINGE | Freq: Once | INTRAMUSCULAR | Status: AC
Start: 2023-08-04 — End: 2023-08-04
  Administered 2023-08-04: 500 mg via INTRAMUSCULAR
  Filled 2023-08-04: qty 10

## 2023-08-04 NOTE — Patient Instructions (Signed)

## 2023-08-04 NOTE — Progress Notes (Signed)
 Hematology and Oncology Follow Up Visit  Caitlin Greene [id number] Aug 18, 1965 58 y.o. 08/04/2023   Principle Diagnosis:  Metastatic breast cancer-ER positive, PR negative/HER-2 negative --bone metastasis only Iron deficiency anemia Vitamin B12 deficiency  Current Therapy:   Faslodex 500 mg IM monthly --start on 04/2020 Ibrance 100 mg p.o. daily (21d on/7d off) - start on 04/2020 Xgeva 120 mg subcu every 3 months -next dose 08/2023  IV iron-Venofer given on 10/21/2022   Status post XRT for right sacral lesion -completed on 03/03/2023 -- 3000 rad Vitamin B12 1000 mcg IM monthly --start on 06/2023     Interim History:  Caitlin Greene is in for follow-up.  She feels okay.  She really has had no complaints.  Her last CA 27.29 was down a little bit at 55.  She does have a little bit of discomfort over the right hip area.  She did have radiotherapy to the right sacrum back in October.  She has had no problems with cough.  She has had no nausea or vomiting.  There has been no change in bowel or bladder habits.  She has had no melena or bright red blood per rectum.  She has had no headache.  She has had no fever.  There has been no rashes.  She has had no leg swelling.  She is doing well with the Ibrance.  This is her week off.  Her last iron studies showed a ferritin of 92 with iron saturation of 26%.  Surprisingly, we did find that she is B12 deficient.  We found that her vitamin B12 level was 162.  She feels little better on supplemental vitamin B12.  Overall, her performance status is ECOG 1.       Wt Readings from Last 3 Encounters:  08/04/23 201 lb (91.2 kg)  07/11/23 204 lb 6.4 oz (92.7 kg)  07/07/23 201 lb 1.9 oz (91.2 kg)    Medications:  Current Outpatient Medications:    acetaminophen (TYLENOL) 650 MG CR tablet, Take 650 mg by mouth every 8 (eight) hours as needed for pain., Disp: , Rfl:    calcium carbonate (OS-CAL) 600 MG tablet, Take 1 tablet (600 mg total) by mouth 2  (two) times daily., Disp: 30 tablet, Rfl: 0   Ferrous Sulfate (IRON) 325 (65 Fe) MG TABS, Take 1 tablet (325 mg total) by mouth daily at 2 PM., Disp: 30 tablet, Rfl: 0   FLUoxetine (PROZAC) 20 MG capsule, TAKE 3 CAPSULES(60 MG) BY MOUTH DAILY, Disp: 270 capsule, Rfl: 3   fulvestrant (FASLODEX) 250 MG/5ML injection, Inject 500 mg into the muscle every 30 (thirty) days. One injection each buttock over 1-2 minutes. Warm prior to use., Disp: , Rfl:    IBRANCE 100 MG tablet, TAKE 1 TABLET DAILY ON DAYS 1 THROUGH 21 OF CYCLE, 7 DAYS OFF. REPEAT, Disp: 21 tablet, Rfl: 0   lidocaine-prilocaine (EMLA) cream, Apply 1 Application topically as needed., Disp: 30 g, Rfl: 0   LORazepam (ATIVAN) 1 MG tablet, Take 1 tablet (1 mg total) by mouth once as needed for up to 1 dose for anxiety. Take 1 tablet (1 mg total) by mouth 20 minutes before MRI scan. Please have a driver for this test as this medication has sedating effects., Disp: 1 tablet, Rfl: 0   meclizine (ANTIVERT) 12.5 MG tablet, Take 1 tablet (12.5 mg total) by mouth 3 (three) times daily as needed for dizziness., Disp: 40 tablet, Rfl: 1   oxyCODONE (OXY IR/ROXICODONE) 5 MG immediate release tablet,  Take 1 tablet (5 mg total) by mouth every 8 (eight) hours as needed for severe pain., Disp: 50 tablet, Rfl: 0   pantoprazole (PROTONIX) 40 MG tablet, Take 1 tablet (40 mg total) by mouth 2 (two) times daily., Disp: 60 tablet, Rfl: 5   vitamin B-12 (CYANOCOBALAMIN) 1000 MCG tablet, Take 1 tablet (1,000 mcg total) by mouth daily., Disp: 30 tablet, Rfl: 0   Wheat Dextrin (BENEFIBER DRINK MIX PO), Take by mouth daily at 6 (six) AM., Disp: , Rfl:   Allergies:  Allergies  Allergen Reactions   Amoxicillin-Pot Clavulanate Hives and Itching   Iron Anaphylaxis and Other (See Comments)    Patient develops hypotension, resp distress after infusion of feraheme.    Penicillins Hives, Itching and Other (See Comments)   Codeine Nausea And Vomiting    Needs pre-meds      Past Medical History, Surgical history, Social history, and Family History were reviewed and updated.  Review of Systems: Review of Systems  Constitutional:  Negative for appetite change.  HENT:  Negative.    Eyes: Negative.   Respiratory: Negative.    Cardiovascular: Negative.   Gastrointestinal:  Negative for abdominal pain and nausea.  Endocrine: Negative.   Genitourinary: Negative.    Musculoskeletal:  Positive for arthralgias and back pain.  Skin: Negative.   Neurological:  Negative for dizziness.  Hematological: Negative.   Psychiatric/Behavioral: Negative.      Physical Exam:  height is 5\' 8"  (1.727 m) and weight is 201 lb (91.2 kg).   Wt Readings from Last 3 Encounters:  08/04/23 201 lb (91.2 kg)  07/11/23 204 lb 6.4 oz (92.7 kg)  07/07/23 201 lb 1.9 oz (91.2 kg)    Physical Exam Vitals reviewed.  HENT:     Head: Normocephalic and atraumatic.  Eyes:     Pupils: Pupils are equal, round, and reactive to light.  Cardiovascular:     Rate and Rhythm: Normal rate and regular rhythm.     Heart sounds: Normal heart sounds.  Pulmonary:     Effort: Pulmonary effort is normal.     Breath sounds: Normal breath sounds.  Abdominal:     General: Bowel sounds are normal.     Palpations: Abdomen is soft.  Musculoskeletal:        General: No tenderness or deformity. Normal range of motion.     Cervical back: Normal range of motion.  Lymphadenopathy:     Cervical: No cervical adenopathy.  Skin:    General: Skin is warm and dry.     Findings: No erythema or rash.  Neurological:     Mental Status: She is alert and oriented to person, place, and time.  Psychiatric:        Behavior: Behavior normal.        Thought Content: Thought content normal.        Judgment: Judgment normal.      Lab Results  Component Value Date   WBC 1.7 (L) 08/04/2023   HGB 11.1 (L) 08/04/2023   HCT 31.5 (L) 08/04/2023   MCV 105.7 (H) 08/04/2023   PLT 115 (L) 08/04/2023      Chemistry      Component Value Date/Time   NA 141 08/04/2023 0955   K 4.0 08/04/2023 0955   CL 107 08/04/2023 0955   CO2 27 08/04/2023 0955   BUN 13 08/04/2023 0955   CREATININE 0.94 08/04/2023 0955   CREATININE 0.88 10/22/2019 1246      Component Value Date/Time  CALCIUM 8.9 08/04/2023 0955   ALKPHOS 44 08/04/2023 0955   AST 12 (L) 08/04/2023 0955   ALT 11 08/04/2023 0955   BILITOT 0.6 08/04/2023 0955      Impression and Plan: Ms. Ra is a very charming 58 year old postmenopausal female with metastatic breast cancer.  We actually had seen her many years ago with a carcinoma in situ.  As always, we followed the CA 27.29.  She will be due for scans sometime in May.  I will provide that the vitamin B12 is helping her.  Again everything is about quality of life.  I will plan to get her back in another month.  When she comes back, she gets both Faslodex and Xgeva.

## 2023-08-05 LAB — CANCER ANTIGEN 27.29: CA 27.29: 61.8 U/mL — ABNORMAL HIGH (ref 0.0–38.6)

## 2023-08-10 ENCOUNTER — Encounter: Payer: Self-pay | Admitting: Internal Medicine

## 2023-08-10 ENCOUNTER — Ambulatory Visit: Admitting: Internal Medicine

## 2023-08-10 ENCOUNTER — Other Ambulatory Visit: Payer: Self-pay | Admitting: Internal Medicine

## 2023-08-10 VITALS — BP 124/76 | HR 70 | Temp 97.7°F | Ht 68.0 in | Wt 201.0 lb

## 2023-08-10 DIAGNOSIS — E538 Deficiency of other specified B group vitamins: Secondary | ICD-10-CM | POA: Diagnosis not present

## 2023-08-10 DIAGNOSIS — Z0001 Encounter for general adult medical examination with abnormal findings: Secondary | ICD-10-CM

## 2023-08-10 DIAGNOSIS — E7800 Pure hypercholesterolemia, unspecified: Secondary | ICD-10-CM

## 2023-08-10 LAB — URINALYSIS, ROUTINE W REFLEX MICROSCOPIC
Bilirubin Urine: NEGATIVE
Hgb urine dipstick: NEGATIVE
Ketones, ur: NEGATIVE
Nitrite: NEGATIVE
Specific Gravity, Urine: 1.015 (ref 1.000–1.030)
Total Protein, Urine: NEGATIVE
Urine Glucose: NEGATIVE
Urobilinogen, UA: 0.2 (ref 0.0–1.0)
pH: 6.5 (ref 5.0–8.0)

## 2023-08-10 LAB — TSH: TSH: 2.71 u[IU]/mL (ref 0.35–5.50)

## 2023-08-10 LAB — LIPID PANEL
Cholesterol: 225 mg/dL — ABNORMAL HIGH (ref 0–200)
HDL: 59.6 mg/dL (ref >39.00)
LDL Cholesterol: 148 mg/dL — ABNORMAL HIGH (ref 0–99)
NonHDL: 165.12
Total CHOL/HDL Ratio: 4
Triglycerides: 88 mg/dL (ref 0.0–149.0)
VLDL: 17.6 mg/dL (ref 0.0–40.0)

## 2023-08-10 MED ORDER — NITROFURANTOIN MACROCRYSTAL 50 MG PO CAPS: 50.0000 mg | ORAL_CAPSULE | Freq: Four times a day (QID) | ORAL | 0 refills | Status: AC

## 2023-08-10 NOTE — Progress Notes (Signed)
 Patient ID: Caitlin Greene, female   DOB: [birth year], 58 y.o.   MRN: [id number]         Chief Complaint:: wellness exam and hld, low b12       HPI:  Caitlin Greene is a 58 y.o. female here for wellness exam; for RSV, prevnar 21 and shingles at pharmacy, o/w up to date                        Also now 4 yrs with stage 4 breast ca.  Pt denies chest pain, increased sob or doe, wheezing, orthopnea, PND, increased LE swelling, palpitations, dizziness or syncope.   Pt denies polydipsia, polyuria, or new focal neuro s/s.    Pt denies fever, wt loss, night sweats, loss of appetite, or other constitutional symptoms     Wt Readings from Last 3 Encounters:  08/10/23 201 lb (91.2 kg)  08/04/23 201 lb (91.2 kg)  07/11/23 204 lb 6.4 oz (92.7 kg)   BP Readings from Last 3 Encounters:  08/11/23 112/75  08/10/23 124/76  08/04/23 112/66   Immunization History  Administered Date(s) Administered   Influenza Split 03/17/2011   Influenza Whole 03/02/2009   Influenza, Seasonal, Injecte, Preservative Fre 04/07/2013   Influenza,inj,Quad PF,6+ Mos 04/08/2014, 01/22/2015, 03/23/2017, 02/25/2020, 03/27/2020   Influenza-Unspecified 02/19/2016, 03/23/2018   PFIZER(Purple Top)SARS-COV-2 Vaccination 07/29/2019, 08/26/2019, 02/07/2020   Pfizer Covid-19 Vaccine Bivalent Booster 34yrs & up 03/15/2021   Pneumococcal Conjugate-13 07/08/2014   Pneumococcal Polysaccharide-23 03/28/2022   Td 05/31/2010   Tdap 11/10/2016   Health Maintenance Due  Topic Date Due   Zoster Vaccines- Shingrix (1 of 2) Never done   Cervical Cancer Screening (HPV/Pap Cotest)  08/08/2014      Past Medical History:  Diagnosis Date   ALLERGIC RHINITIS    ANEMIA-IRON DEFICIENCY    ANXIETY    BREAST CANCER, HX OF 01/21/2007   at 58yo   DEPRESSION    GERD    History of radiation therapy    Spine-02/20/23-03/03/23- Dr. Antony Blackbird   HYPERLIPIDEMIA    Metastatic cancer to bone (HCC) dx'd 10/24/2019   recurrent breast ca   Pneumonia  02/2022   RENAL CALCULUS    rt breast ca dx'd 1988   Past Surgical History:  Procedure Laterality Date   BIOPSY  03/21/2020   Procedure: BIOPSY;  Surgeon: Tressia Danas, MD;  Location: [facility];  Service: Gastroenterology;;   BIOPSY  09/13/2021   Procedure: BIOPSY;  Surgeon: Rachael Fee, MD;  Location: [facility];  Service: Gastroenterology;;   BIOPSY  10/19/2021   Procedure: BIOPSY;  Surgeon: Tressia Danas, MD;  Location: [facility];  Service: Gastroenterology;;   BREAST ENHANCEMENT SURGERY     COLONOSCOPY WITH PROPOFOL N/A 10/19/2021   Procedure: COLONOSCOPY WITH PROPOFOL;  Surgeon: Tressia Danas, MD;  Location: [facility];  Service: Gastroenterology;  Laterality: N/A;   ENTEROSCOPY N/A 10/19/2021   Procedure: ENTEROSCOPY;  Surgeon: Tressia Danas, MD;  Location: [facility];  Service: Gastroenterology;  Laterality: N/A;   ESOPHAGOGASTRODUODENOSCOPY N/A 10/16/2021   Procedure: ESOPHAGOGASTRODUODENOSCOPY (EGD);  Surgeon: Jenel Lucks, MD;  Location: [facility];  Service: Gastroenterology;  Laterality: N/A;   ESOPHAGOGASTRODUODENOSCOPY (EGD) WITH PROPOFOL N/A 03/21/2020   Procedure: ESOPHAGOGASTRODUODENOSCOPY (EGD) WITH PROPOFOL;  Surgeon: Tressia Danas, MD;  Location: [facility];  Service: Gastroenterology;  Laterality: N/A;   ESOPHAGOGASTRODUODENOSCOPY (EGD) WITH PROPOFOL N/A 09/13/2021   Procedure: ESOPHAGOGASTRODUODENOSCOPY (EGD) WITH PROPOFOL;  Surgeon: Rachael Fee, MD;  Location: [facility];  Service: Gastroenterology;  Laterality: N/A;   IR IMAGING GUIDED PORT INSERTION  10/04/2022   MASTECTOMY     right   POLYPECTOMY  10/19/2021   Procedure: POLYPECTOMY;  Surgeon: Tressia Danas, MD;  Location: [facility];  Service: Gastroenterology;;   TEMPOROMANDIBULAR JOINT SURGERY     Left   TONSILLECTOMY      reports that she has never smoked. She has never been exposed to tobacco smoke. She has never used smokeless tobacco. She reports that  she does not drink alcohol and does not use drugs. family history includes Asthma in her sister; COPD in her father; Cancer in her mother; Colon polyps in her father and mother; Dementia in her mother; Diabetes in an other family member; Hypertension in her mother and another family member. Allergies  Allergen Reactions   Amoxicillin-Pot Clavulanate Hives and Itching   Iron Anaphylaxis and Other (See Comments)    Patient develops hypotension, resp distress after infusion of feraheme.    Penicillins Hives, Itching and Other (See Comments)   Codeine Nausea And Vomiting    Needs pre-meds    Current Outpatient Medications on File Prior to Visit  Medication Sig Dispense Refill   acetaminophen (TYLENOL) 650 MG CR tablet Take 650 mg by mouth every 8 (eight) hours as needed for pain.     calcium carbonate (OS-CAL) 600 MG tablet Take 1 tablet (600 mg total) by mouth 2 (two) times daily. 30 tablet 0   Ferrous Sulfate (IRON) 325 (65 Fe) MG TABS Take 1 tablet (325 mg total) by mouth daily at 2 PM. 30 tablet 0   FLUoxetine (PROZAC) 20 MG capsule TAKE 3 CAPSULES(60 MG) BY MOUTH DAILY 270 capsule 3   fulvestrant (FASLODEX) 250 MG/5ML injection Inject 500 mg into the muscle every 30 (thirty) days. One injection each buttock over 1-2 minutes. Warm prior to use.     IBRANCE 100 MG tablet TAKE 1 TABLET DAILY ON DAYS 1 THROUGH 21 OF CYCLE, 7 DAYS OFF. REPEAT 21 tablet 0   lidocaine-prilocaine (EMLA) cream Apply 1 Application topically as needed. 30 g 0   LORazepam (ATIVAN) 1 MG tablet Take 1 tablet (1 mg total) by mouth once as needed for up to 1 dose for anxiety. Take 1 tablet (1 mg total) by mouth 20 minutes before MRI scan. Please have a driver for this test as this medication has sedating effects. 1 tablet 0   meclizine (ANTIVERT) 12.5 MG tablet Take 1 tablet (12.5 mg total) by mouth 3 (three) times daily as needed for dizziness. 40 tablet 1   oxyCODONE (OXY IR/ROXICODONE) 5 MG immediate release tablet Take 1  tablet (5 mg total) by mouth every 8 (eight) hours as needed for severe pain. 50 tablet 0   pantoprazole (PROTONIX) 40 MG tablet Take 1 tablet (40 mg total) by mouth 2 (two) times daily. 60 tablet 5   vitamin B-12 (CYANOCOBALAMIN) 1000 MCG tablet Take 1 tablet (1,000 mcg total) by mouth daily. 30 tablet 0   Wheat Dextrin (BENEFIBER DRINK MIX PO) Take by mouth daily at 6 (six) AM.     No current facility-administered medications on file prior to visit.        ROS:  All others reviewed and negative.  Objective        PE:  BP 124/76 (BP Location: Left Arm, Patient Position: Sitting, Cuff Size: Normal)   Pulse 70   Temp 97.7 F (36.5 C) (Oral)   Ht 5\' 8"  (1.727 m)   Wt  201 lb (91.2 kg)   LMP 06/09/2017 (Exact Date)   SpO2 98%   BMI 30.56 kg/m                 Constitutional: Pt appears in NAD               HENT: Head: NCAT.                Right Ear: External ear normal.                 Left Ear: External ear normal.                Eyes: . Pupils are equal, round, and reactive to light. Conjunctivae and EOM are normal               Nose: without d/c or deformity               Neck: Neck supple. Gross normal ROM               Cardiovascular: Normal rate and regular rhythm.                 Pulmonary/Chest: Effort normal and breath sounds without rales or wheezing.                Abd:  Soft, NT, ND, + BS, no organomegaly               Neurological: Pt is alert. At baseline orientation, motor grossly intact               Skin: Skin is warm. No rashes, no other new lesions, LE edema - none               Psychiatric: Pt behavior is normal without agitation   Micro: none  Cardiac tracings I have personally interpreted today:  none  Pertinent Radiological findings (summarize): none   Lab Results  Component Value Date   WBC 1.7 (L) 08/04/2023   HGB 11.1 (L) 08/04/2023   HCT 31.5 (L) 08/04/2023   PLT 115 (L) 08/04/2023   GLUCOSE 91 08/04/2023   CHOL 225 (H) 08/10/2023   TRIG 88.0  08/10/2023   HDL 59.60 08/10/2023   LDLDIRECT 141.0 04/12/2018   LDLCALC 148 (H) 08/10/2023   ALT 11 08/04/2023   AST 12 (L) 08/04/2023   NA 141 08/04/2023   K 4.0 08/04/2023   CL 107 08/04/2023   CREATININE 0.94 08/04/2023   BUN 13 08/04/2023   CO2 27 08/04/2023   TSH 2.71 08/10/2023   INR 1.1 08/28/2022   HGBA1C 4.3 (L) 10/15/2021   Assessment/Plan:  Paislie Tessler is a 58 y.o. White or Caucasian [1] female with  has a past medical history of ALLERGIC RHINITIS, ANEMIA-IRON DEFICIENCY, ANXIETY, BREAST CANCER, HX OF (01/21/2007), DEPRESSION, GERD, History of radiation therapy, HYPERLIPIDEMIA, Metastatic cancer to bone (HCC) (dx'd 10/24/2019), Pneumonia (02/2022), RENAL CALCULUS, and rt breast ca (dx'd 1988).  Encounter for well adult exam with abnormal findings Age and sex appropriate education and counseling updated with regular exercise and diet Referrals for preventative services - none needed Immunizations addressed - for RSV, prevnar 21, and shingrix at pharmacy Smoking counseling  - none needed Evidence for depression or other mood disorder - none significant Most recent labs reviewed. I have personally reviewed and have noted: 1) the patient's medical and social history 2) The patient's current medications and supplements 3) The patient's height, weight, and BMI have been recorded  in the chart   HLD (hyperlipidemia) Lab Results  Component Value Date   LDLCALC 148 (H) 08/10/2023   uncontrolled, pt declines statin for now   B12 deficiency Lab Results  Component Value Date   VITAMINB12 314 08/04/2023   Stable, cont oral replacement - b12 1000 mcg qd  Followup: Return in about 1 year (around 08/09/2024).  Oliver Barre, MD 08/12/2023 8:42 PM [facility]

## 2023-08-10 NOTE — Patient Instructions (Signed)
 Ok to have the RSV shot, then PCV21 shot (pneumonia shot), then Please have your Shingrix (shingles) shots done at your local pharmacy.  Please continue all other medications as before, and refills have been done if requested.  Please have the pharmacy call with any other refills you may need.  Please continue your efforts at being more active, low cholesterol diet, and weight control.  You are otherwise up to date with prevention measures today.  Please keep your appointments with your specialists as you may have planned  Please go to the LAB at the blood drawing area for the tests to be done  You will be contacted by phone if any changes need to be made immediately.  Otherwise, you will receive a letter about your results with an explanation, but please check with MyChart first.  Please make an Appointment to return for your 1 year visit, or sooner if needed

## 2023-08-11 ENCOUNTER — Inpatient Hospital Stay: Attending: Hematology & Oncology

## 2023-08-11 VITALS — BP 112/75 | HR 76 | Temp 97.5°F

## 2023-08-11 DIAGNOSIS — C7951 Secondary malignant neoplasm of bone: Secondary | ICD-10-CM | POA: Diagnosis not present

## 2023-08-11 DIAGNOSIS — C50919 Malignant neoplasm of unspecified site of unspecified female breast: Secondary | ICD-10-CM | POA: Insufficient documentation

## 2023-08-11 DIAGNOSIS — D509 Iron deficiency anemia, unspecified: Secondary | ICD-10-CM | POA: Diagnosis not present

## 2023-08-11 DIAGNOSIS — Z923 Personal history of irradiation: Secondary | ICD-10-CM | POA: Insufficient documentation

## 2023-08-11 DIAGNOSIS — Z1722 Progesterone receptor negative status: Secondary | ICD-10-CM | POA: Insufficient documentation

## 2023-08-11 DIAGNOSIS — E538 Deficiency of other specified B group vitamins: Secondary | ICD-10-CM | POA: Diagnosis not present

## 2023-08-11 DIAGNOSIS — Z170 Estrogen receptor positive status [ER+]: Secondary | ICD-10-CM | POA: Insufficient documentation

## 2023-08-11 DIAGNOSIS — Z79899 Other long term (current) drug therapy: Secondary | ICD-10-CM | POA: Insufficient documentation

## 2023-08-11 MED ORDER — CYANOCOBALAMIN 1000 MCG/ML IJ SOLN
1000.0000 ug | Freq: Once | INTRAMUSCULAR | Status: AC
  Administered 2023-08-11: 1000 ug via INTRAMUSCULAR
  Filled 2023-08-11: qty 1

## 2023-08-11 NOTE — Patient Instructions (Signed)
 Vitamin B12 Injection What is this medication? Vitamin B12 (VAHY tuh min B12) prevents and treats low vitamin B12 levels in your body. It is used in people who do not get enough vitamin B12 from their diet or when their digestive tract does not absorb enough. Vitamin B12 plays an important role in maintaining the health of your nervous system and red blood cells. This medicine may be used for other purposes; ask your health care provider or pharmacist if you have questions. COMMON BRAND NAME(S): B-12 Compliance Kit, B-12 Injection Kit, Cyomin, Dodex, LA-12, Nutri-Twelve, Physicians EZ Use B-12, Primabalt, Vitamin Deficiency Injectable System - B12 What should I tell my care team before I take this medication? They need to know if you have any of these conditions: Kidney disease Leber's disease Megaloblastic anemia An unusual or allergic reaction to cyanocobalamin, cobalt, other medications, foods, dyes, or preservatives Pregnant or trying to get pregnant Breast-feeding How should I use this medication? This medication is injected into a muscle or deeply under the skin. It is usually given in a clinic or care team's office. However, your care team may teach you how to inject yourself. Follow all instructions. Talk to your care team about the use of this medication in children. Special care may be needed. Overdosage: If you think you have taken too much of this medicine contact a poison control center or emergency room at once. NOTE: This medicine is only for you. Do not share this medicine with others. What if I miss a dose? If you are given your dose at a clinic or care team's office, call to reschedule your appointment. If you give your own injections, and you miss a dose, take it as soon as you can. If it is almost time for your next dose, take only that dose. Do not take double or extra doses. What may interact with this medication? Alcohol Colchicine This list may not describe all possible  interactions. Give your health care provider a list of all the medicines, herbs, non-prescription drugs, or dietary supplements you use. Also tell them if you smoke, drink alcohol, or use illegal drugs. Some items may interact with your medicine. What should I watch for while using this medication? Visit your care team regularly. You may need blood work done while you are taking this medication. You may need to follow a special diet. Talk to your care team. Limit your alcohol intake and avoid smoking to get the best benefit. What side effects may I notice from receiving this medication? Side effects that you should report to your care team as soon as possible: Allergic reactions--skin rash, itching, hives, swelling of the face, lips, tongue, or throat Swelling of the ankles, hands, or feet Trouble breathing Side effects that usually do not require medical attention (report to your care team if they continue or are bothersome): Diarrhea This list may not describe all possible side effects. Call your doctor for medical advice about side effects. You may report side effects to FDA at 1-800-FDA-1088. Where should I keep my medication? Keep out of the reach of children. Store at room temperature between 15 and 30 degrees C (59 and 85 degrees F). Protect from light. Throw away any unused medication after the expiration date. NOTE: This sheet is a summary. It may not cover all possible information. If you have questions about this medicine, talk to your doctor, pharmacist, or health care provider.  2024 Elsevier/Gold Standard (2021-01-05 00:00:00)

## 2023-08-12 ENCOUNTER — Encounter: Payer: Self-pay | Admitting: Internal Medicine

## 2023-08-12 NOTE — Assessment & Plan Note (Signed)
 Age and sex appropriate education and counseling updated with regular exercise and diet Referrals for preventative services - none needed Immunizations addressed - for RSV, prevnar 21, and shingrix at pharmacy Smoking counseling  - none needed Evidence for depression or other mood disorder - none significant Most recent labs reviewed. I have personally reviewed and have noted: 1) the patient's medical and social history 2) The patient's current medications and supplements 3) The patient's height, weight, and BMI have been recorded in the chart

## 2023-08-12 NOTE — Assessment & Plan Note (Signed)
 Lab Results  Component Value Date   VITAMINB12 314 08/04/2023   Stable, cont oral replacement - b12 1000 mcg qd

## 2023-08-12 NOTE — Assessment & Plan Note (Signed)
 Lab Results  Component Value Date   LDLCALC 148 (H) 08/10/2023   uncontrolled, pt declines statin for now

## 2023-08-18 ENCOUNTER — Inpatient Hospital Stay

## 2023-08-18 VITALS — BP 117/75 | HR 88 | Temp 98.1°F | Resp 18

## 2023-08-18 DIAGNOSIS — C50919 Malignant neoplasm of unspecified site of unspecified female breast: Secondary | ICD-10-CM | POA: Diagnosis not present

## 2023-08-18 DIAGNOSIS — D509 Iron deficiency anemia, unspecified: Secondary | ICD-10-CM | POA: Diagnosis not present

## 2023-08-18 DIAGNOSIS — E538 Deficiency of other specified B group vitamins: Secondary | ICD-10-CM | POA: Diagnosis not present

## 2023-08-18 DIAGNOSIS — C7951 Secondary malignant neoplasm of bone: Secondary | ICD-10-CM | POA: Diagnosis not present

## 2023-08-18 DIAGNOSIS — Z1722 Progesterone receptor negative status: Secondary | ICD-10-CM | POA: Diagnosis not present

## 2023-08-18 DIAGNOSIS — Z170 Estrogen receptor positive status [ER+]: Secondary | ICD-10-CM | POA: Diagnosis not present

## 2023-08-18 DIAGNOSIS — Z923 Personal history of irradiation: Secondary | ICD-10-CM | POA: Diagnosis not present

## 2023-08-18 DIAGNOSIS — Z79899 Other long term (current) drug therapy: Secondary | ICD-10-CM | POA: Diagnosis not present

## 2023-08-18 MED ORDER — CYANOCOBALAMIN 1000 MCG/ML IJ SOLN
1000.0000 ug | INTRAMUSCULAR | Status: DC
  Administered 2023-08-18: 1000 ug via INTRAMUSCULAR
  Filled 2023-08-18: qty 1

## 2023-08-18 NOTE — Patient Instructions (Signed)
 Vitamin B12 Injection What is this medication? Vitamin B12 (VAHY tuh min B12) prevents and treats low vitamin B12 levels in your body. It is used in people who do not get enough vitamin B12 from their diet or when their digestive tract does not absorb enough. Vitamin B12 plays an important role in maintaining the health of your nervous system and red blood cells. This medicine may be used for other purposes; ask your health care provider or pharmacist if you have questions. COMMON BRAND NAME(S): B-12 Compliance Kit, B-12 Injection Kit, Cyomin, Dodex, LA-12, Nutri-Twelve, Physicians EZ Use B-12, Primabalt, Vitamin Deficiency Injectable System - B12 What should I tell my care team before I take this medication? They need to know if you have any of these conditions: Kidney disease Leber's disease Megaloblastic anemia An unusual or allergic reaction to cyanocobalamin, cobalt, other medications, foods, dyes, or preservatives Pregnant or trying to get pregnant Breast-feeding How should I use this medication? This medication is injected into a muscle or deeply under the skin. It is usually given in a clinic or care team's office. However, your care team may teach you how to inject yourself. Follow all instructions. Talk to your care team about the use of this medication in children. Special care may be needed. Overdosage: If you think you have taken too much of this medicine contact a poison control center or emergency room at once. NOTE: This medicine is only for you. Do not share this medicine with others. What if I miss a dose? If you are given your dose at a clinic or care team's office, call to reschedule your appointment. If you give your own injections, and you miss a dose, take it as soon as you can. If it is almost time for your next dose, take only that dose. Do not take double or extra doses. What may interact with this medication? Alcohol Colchicine This list may not describe all possible  interactions. Give your health care provider a list of all the medicines, herbs, non-prescription drugs, or dietary supplements you use. Also tell them if you smoke, drink alcohol, or use illegal drugs. Some items may interact with your medicine. What should I watch for while using this medication? Visit your care team regularly. You may need blood work done while you are taking this medication. You may need to follow a special diet. Talk to your care team. Limit your alcohol intake and avoid smoking to get the best benefit. What side effects may I notice from receiving this medication? Side effects that you should report to your care team as soon as possible: Allergic reactions--skin rash, itching, hives, swelling of the face, lips, tongue, or throat Swelling of the ankles, hands, or feet Trouble breathing Side effects that usually do not require medical attention (report to your care team if they continue or are bothersome): Diarrhea This list may not describe all possible side effects. Call your doctor for medical advice about side effects. You may report side effects to FDA at 1-800-FDA-1088. Where should I keep my medication? Keep out of the reach of children. Store at room temperature between 15 and 30 degrees C (59 and 85 degrees F). Protect from light. Throw away any unused medication after the expiration date. NOTE: This sheet is a summary. It may not cover all possible information. If you have questions about this medicine, talk to your doctor, pharmacist, or health care provider.  2024 Elsevier/Gold Standard (2021-01-05 00:00:00)

## 2023-08-22 ENCOUNTER — Other Ambulatory Visit: Payer: Self-pay

## 2023-08-23 ENCOUNTER — Other Ambulatory Visit (HOSPITAL_COMMUNITY): Payer: Self-pay

## 2023-08-23 ENCOUNTER — Other Ambulatory Visit: Payer: Self-pay

## 2023-08-23 ENCOUNTER — Other Ambulatory Visit: Payer: Self-pay | Admitting: Hematology & Oncology

## 2023-08-23 MED ORDER — IBRANCE 100 MG PO TABS
ORAL_TABLET | ORAL | 0 refills | Status: DC
  Filled 2023-08-23 (×2): qty 21, 28d supply, fill #0

## 2023-08-23 NOTE — Progress Notes (Signed)
 Specialty Pharmacy Refill Coordination Note  Caitlin Greene is a 58 y.o. female contacted today regarding refills of specialty medication(s) Ibrance.  Patient requested (Patient-Rptd) Delivery   Delivery date: (Patient-Rptd) 08/31/23   Verified address: (Patient-Rptd) [address] [place], [place] [address]   Medication will be filled on 08/30/23.   This fill date is pending response to refill request from provider. Patient is aware and if they have not received fill by intended date, they must follow up with pharmacy.

## 2023-08-28 ENCOUNTER — Telehealth: Payer: Self-pay

## 2023-08-28 NOTE — Telephone Encounter (Signed)
 Clinical Social Work was referred by medical provider for assessment of psychosocial needs.  CSW attempted to contact patient by phone.  Left voicemail with contact information and request for return call.

## 2023-08-30 ENCOUNTER — Other Ambulatory Visit: Payer: Self-pay

## 2023-08-31 ENCOUNTER — Telehealth: Payer: Self-pay

## 2023-08-31 NOTE — Telephone Encounter (Signed)
 Clinical Social Work was referred by medical provider for assessment of psychosocial needs.  CSW attempted to contact patient by phone a second time.  Left voicemail with contact information and request for return call.

## 2023-09-01 ENCOUNTER — Encounter: Payer: Self-pay | Admitting: Hematology & Oncology

## 2023-09-01 ENCOUNTER — Inpatient Hospital Stay (HOSPITAL_BASED_OUTPATIENT_CLINIC_OR_DEPARTMENT_OTHER): Payer: Medicare HMO | Admitting: Hematology & Oncology

## 2023-09-01 ENCOUNTER — Inpatient Hospital Stay: Payer: Medicare HMO

## 2023-09-01 VITALS — BP 105/73 | HR 81 | Temp 97.9°F | Resp 17 | Ht 68.0 in | Wt 194.0 lb

## 2023-09-01 DIAGNOSIS — C50919 Malignant neoplasm of unspecified site of unspecified female breast: Secondary | ICD-10-CM | POA: Diagnosis not present

## 2023-09-01 DIAGNOSIS — D519 Vitamin B12 deficiency anemia, unspecified: Secondary | ICD-10-CM

## 2023-09-01 DIAGNOSIS — D509 Iron deficiency anemia, unspecified: Secondary | ICD-10-CM | POA: Diagnosis not present

## 2023-09-01 DIAGNOSIS — E538 Deficiency of other specified B group vitamins: Secondary | ICD-10-CM | POA: Diagnosis not present

## 2023-09-01 DIAGNOSIS — Z1722 Progesterone receptor negative status: Secondary | ICD-10-CM | POA: Diagnosis not present

## 2023-09-01 DIAGNOSIS — K921 Melena: Secondary | ICD-10-CM

## 2023-09-01 DIAGNOSIS — Z79899 Other long term (current) drug therapy: Secondary | ICD-10-CM | POA: Diagnosis not present

## 2023-09-01 DIAGNOSIS — C7951 Secondary malignant neoplasm of bone: Secondary | ICD-10-CM | POA: Diagnosis not present

## 2023-09-01 DIAGNOSIS — Z170 Estrogen receptor positive status [ER+]: Secondary | ICD-10-CM | POA: Diagnosis not present

## 2023-09-01 DIAGNOSIS — Z923 Personal history of irradiation: Secondary | ICD-10-CM | POA: Diagnosis not present

## 2023-09-01 LAB — CMP (CANCER CENTER ONLY)
ALT: 11 U/L (ref 0–44)
AST: 10 U/L — ABNORMAL LOW (ref 15–41)
Albumin: 4.3 g/dL (ref 3.5–5.0)
Alkaline Phosphatase: 45 U/L (ref 38–126)
Anion gap: 8 (ref 5–15)
BUN: 14 mg/dL (ref 6–20)
CO2: 27 mmol/L (ref 22–32)
Calcium: 9.4 mg/dL (ref 8.9–10.3)
Chloride: 105 mmol/L (ref 98–111)
Creatinine: 0.99 mg/dL (ref 0.44–1.00)
GFR, Estimated: >60 mL/min (ref >60)
Glucose, Bld: 94 mg/dL (ref 70–99)
Potassium: 3.9 mmol/L (ref 3.5–5.1)
Sodium: 140 mmol/L (ref 135–145)
Total Bilirubin: 0.6 mg/dL (ref 0.0–1.2)
Total Protein: 6.5 g/dL (ref 6.5–8.1)

## 2023-09-01 LAB — CBC WITH DIFFERENTIAL (CANCER CENTER ONLY)
Abs Immature Granulocytes: 0 10*3/uL (ref 0.00–0.07)
Basophils Absolute: 0 10*3/uL (ref 0.0–0.1)
Basophils Relative: 2 %
Eosinophils Absolute: 0.1 10*3/uL (ref 0.0–0.5)
Eosinophils Relative: 4 %
HCT: 34.3 % — ABNORMAL LOW (ref 36.0–46.0)
Hemoglobin: 12 g/dL (ref 12.0–15.0)
Immature Granulocytes: 0 %
Lymphocytes Relative: 25 %
Lymphs Abs: 0.5 10*3/uL — ABNORMAL LOW (ref 0.7–4.0)
MCH: 36.5 pg — ABNORMAL HIGH (ref 26.0–34.0)
MCHC: 35 g/dL (ref 30.0–36.0)
MCV: 104.3 fL — ABNORMAL HIGH (ref 80.0–100.0)
Monocytes Absolute: 0.2 10*3/uL (ref 0.1–1.0)
Monocytes Relative: 9 %
Neutro Abs: 1.2 10*3/uL — ABNORMAL LOW (ref 1.7–7.7)
Neutrophils Relative %: 60 %
Platelet Count: 146 10*3/uL — ABNORMAL LOW (ref 150–400)
RBC: 3.29 MIL/uL — ABNORMAL LOW (ref 3.87–5.11)
RDW: 13.7 % (ref 11.5–15.5)
WBC Count: 2 10*3/uL — ABNORMAL LOW (ref 4.0–10.5)
nRBC: 0 % (ref 0.0–0.2)

## 2023-09-01 LAB — FERRITIN: Ferritin: 160 ng/mL (ref 11–307)

## 2023-09-01 LAB — IRON AND IRON BINDING CAPACITY (CC-WL,HP ONLY)
Iron: 109 ug/dL (ref 28–170)
Saturation Ratios: 35 % — ABNORMAL HIGH (ref 10.4–31.8)
TIBC: 308 ug/dL (ref 250–450)
UIBC: 199 ug/dL (ref 148–442)

## 2023-09-01 LAB — VITAMIN B12: Vitamin B-12: 828 pg/mL (ref 180–914)

## 2023-09-01 MED ORDER — HEPARIN SOD (PORK) LOCK FLUSH 100 UNIT/ML IV SOLN
500.0000 [IU] | Freq: Once | INTRAVENOUS | Status: AC
  Administered 2023-09-01: 500 [IU] via INTRAVENOUS

## 2023-09-01 MED ORDER — DENOSUMAB 120 MG/1.7ML ~~LOC~~ SOLN
120.0000 mg | Freq: Once | SUBCUTANEOUS | Status: AC
  Administered 2023-09-01: 120 mg via SUBCUTANEOUS
  Filled 2023-09-01: qty 1.7

## 2023-09-01 MED ORDER — FULVESTRANT 250 MG/5ML IM SOSY
500.0000 mg | PREFILLED_SYRINGE | INTRAMUSCULAR | Status: DC
  Administered 2023-09-01: 500 mg via INTRAMUSCULAR
  Filled 2023-09-01: qty 10

## 2023-09-01 MED ORDER — SODIUM CHLORIDE 0.9% FLUSH
10.0000 mL | INTRAVENOUS | Status: DC | PRN
  Administered 2023-09-01: 10 mL via INTRAVENOUS

## 2023-09-01 NOTE — Patient Instructions (Signed)

## 2023-09-01 NOTE — Progress Notes (Signed)
 Hematology and Oncology Follow Up Visit  Caitlin Greene [id number] [birth year] 58 y.o. 09/01/2023   Principle Diagnosis:  Metastatic breast cancer-ER positive, PR negative/HER-2 negative --bone metastasis only Iron  deficiency anemia Vitamin B12 deficiency  Current Therapy:   Faslodex  500 mg IM monthly --start on 04/2020 -next dose on 09/2023 Ibrance  100 mg p.o. daily (21d on/7d off) - start on 04/2020 Xgeva  120 mg subcu every 3 months -next dose 11/2023  IV iron -Venofer  given on 10/21/2022   Status post XRT for right sacral lesion -completed on 03/03/2023 -- 3000 rad Vitamin B12 1000 mcg IM monthly --start on 06/2023     Interim History:  Caitlin Greene is in for follow-up.  So far, everything is going quite well for her.  She did have a very nice Easter weekend with her family.  Tonight, she is going to her brother's house further weekly dinner.  Her last CA 27.29 was 62.  Again this does seem to fluctuate.  She has had no problems with bleeding.  Her iron  studies that were done today showed a ferritin of 1 6 with an iron  saturation of 35%.  Her vitamin B12 was 828.  She has had no problems with pain although she has little bit of discomfort in the hips.  She has little bit of back discomfort.  This is in the mid back.  Her last set of scans I think were done in February.  We probably need to set her up with some more scans and may.  She has had no bleeding.  She has had no headache.  There has been no leg swelling.  Overall, I would say that her performance status is probably ECOG 1.       Wt Readings from Last 3 Encounters:  09/01/23 194 lb (88 kg)  08/10/23 201 lb (91.2 kg)  08/04/23 201 lb (91.2 kg)    Medications:  Current Outpatient Medications:    acetaminophen  (TYLENOL ) 650 MG CR tablet, Take 650 mg by mouth every 8 (eight) hours as needed for pain., Disp: , Rfl:    calcium  carbonate (OS-CAL) 600 MG tablet, Take 1 tablet (600 mg total) by mouth 2 (two) times daily.,  Disp: 30 tablet, Rfl: 0   Ferrous Sulfate (IRON ) 325 (65 Fe) MG TABS, Take 1 tablet (325 mg total) by mouth daily at 2 PM., Disp: 30 tablet, Rfl: 0   FLUoxetine  (PROZAC ) 20 MG capsule, TAKE 3 CAPSULES(60 MG) BY MOUTH DAILY, Disp: 270 capsule, Rfl: 3   fulvestrant  (FASLODEX ) 250 MG/5ML injection, Inject 500 mg into the muscle every 30 (thirty) days. One injection each buttock over 1-2 minutes. Warm prior to use., Disp: , Rfl:    IBRANCE  100 MG tablet, TAKE 1 TABLET DAILY ON DAYS 1 THROUGH 21 OF CYCLE, 7 DAYS OFF. REPEAT, Disp: 21 tablet, Rfl: 0   LORazepam  (ATIVAN ) 1 MG tablet, Take 1 tablet (1 mg total) by mouth once as needed for up to 1 dose for anxiety. Take 1 tablet (1 mg total) by mouth 20 minutes before MRI scan. Please have a driver for this test as this medication has sedating effects., Disp: 1 tablet, Rfl: 0   meclizine  (ANTIVERT ) 12.5 MG tablet, Take 1 tablet (12.5 mg total) by mouth 3 (three) times daily as needed for dizziness., Disp: 40 tablet, Rfl: 1   oxyCODONE  (OXY IR/ROXICODONE ) 5 MG immediate release tablet, Take 1 tablet (5 mg total) by mouth every 8 (eight) hours as needed for severe pain., Disp: 50 tablet, Rfl: 0  pantoprazole  (PROTONIX ) 40 MG tablet, Take 1 tablet (40 mg total) by mouth 2 (two) times daily., Disp: 60 tablet, Rfl: 5   vitamin B-12 (CYANOCOBALAMIN ) 1000 MCG tablet, Take 1 tablet (1,000 mcg total) by mouth daily., Disp: 30 tablet, Rfl: 0   Wheat Dextrin (BENEFIBER DRINK MIX PO), Take by mouth daily at 6 (six) AM., Disp: , Rfl:    lidocaine -prilocaine  (EMLA ) cream, Apply 1 Application topically as needed. (Patient not taking: Reported on 09/01/2023), Disp: 30 g, Rfl: 0  Allergies:  Allergies  Allergen Reactions   Amoxicillin-Pot Clavulanate Hives and Itching   Iron  Anaphylaxis and Other (See Comments)    Patient develops hypotension, resp distress after infusion of feraheme .    Penicillins Hives, Itching and Other (See Comments)   Codeine Nausea And Vomiting     Needs pre-meds     Past Medical History, Surgical history, Social history, and Family History were reviewed and updated.  Review of Systems: Review of Systems  Constitutional:  Negative for appetite change.  HENT:  Negative.    Eyes: Negative.   Respiratory: Negative.    Cardiovascular: Negative.   Gastrointestinal:  Negative for abdominal pain and nausea.  Endocrine: Negative.   Genitourinary: Negative.    Musculoskeletal:  Positive for arthralgias and back pain.  Skin: Negative.   Neurological:  Negative for dizziness.  Hematological: Negative.   Psychiatric/Behavioral: Negative.      Physical Exam:  height is 5\' 8"  (1.727 m) and weight is 194 lb (88 kg). Her oral temperature is 97.9 F (36.6 C). Her blood pressure is 105/73 and her pulse is 81. Her respiration is 17 and oxygen saturation is 94%.   Wt Readings from Last 3 Encounters:  09/01/23 194 lb (88 kg)  08/10/23 201 lb (91.2 kg)  08/04/23 201 lb (91.2 kg)    Physical Exam Vitals reviewed.  HENT:     Head: Normocephalic and atraumatic.  Eyes:     Pupils: Pupils are equal, round, and reactive to light.  Cardiovascular:     Rate and Rhythm: Normal rate and regular rhythm.     Heart sounds: Normal heart sounds.  Pulmonary:     Effort: Pulmonary effort is normal.     Breath sounds: Normal breath sounds.  Abdominal:     General: Bowel sounds are normal.     Palpations: Abdomen is soft.  Musculoskeletal:        General: No tenderness or deformity. Normal range of motion.     Cervical back: Normal range of motion.  Lymphadenopathy:     Cervical: No cervical adenopathy.  Skin:    General: Skin is warm and dry.     Findings: No erythema or rash.  Neurological:     Mental Status: She is alert and oriented to person, place, and time.  Psychiatric:        Behavior: Behavior normal.        Thought Content: Thought content normal.        Judgment: Judgment normal.     Lab Results  Component Value Date   WBC  2.0 (L) 09/01/2023   HGB 12.0 09/01/2023   HCT 34.3 (L) 09/01/2023   MCV 104.3 (H) 09/01/2023   PLT 146 (L) 09/01/2023     Chemistry      Component Value Date/Time   NA 141 08/04/2023 0955   K 4.0 08/04/2023 0955   CL 107 08/04/2023 0955   CO2 27 08/04/2023 0955   BUN 13 08/04/2023 0955  CREATININE 0.94 08/04/2023 0955   CREATININE 0.88 10/22/2019 1246      Component Value Date/Time   CALCIUM  8.9 08/04/2023 0955   ALKPHOS 44 08/04/2023 0955   AST 12 (L) 08/04/2023 0955   ALT 11 08/04/2023 0955   BILITOT 0.6 08/04/2023 0955      Impression and Plan: Caitlin Greene is a very charming 58 year old postmenopausal female with metastatic breast cancer.  We actually had seen her many years ago with a carcinoma in situ.  She has she has done incredibly well with her metastatic breast cancer.  We have only had her on antiestrogen therapy.  This been going on for things for years.  Again, we will get a a CT scan and a bone scan on her.  Lets see how everything looks.  Hopefully everything will still look relatively stable.  She will get her Faslodex , Xgeva  and vitamin B12 today.  We will plan to get her back in 4 weeks or so.

## 2023-09-01 NOTE — Patient Instructions (Signed)
 Fulvestrant Injection What is this medication? FULVESTRANT (ful VES trant) treats breast cancer. It works by blocking the hormone estrogen in breast tissue, which prevents breast cancer cells from spreading or growing. This medicine may be used for other purposes; ask your health care provider or pharmacist if you have questions. COMMON BRAND NAME(S): FASLODEX What should I tell my care team before I take this medication? They need to know if you have any of these conditions: Bleeding disorder Liver disease Low blood cell levels (white cells, red cells, and platelets) An unusual or allergic reaction to fulvestrant, other medications, foods, dyes, or preservatives Pregnant or trying to get pregnant Breastfeeding How should I use this medication? This medication is injected into a muscle. It is given by your care team in a hospital or clinic setting. Talk to your care team about the use of this medication in children. Special care may be needed. Overdosage: If you think you have taken too much of this medicine contact a poison control center or emergency room at once. NOTE: This medicine is only for you. Do not share this medicine with others. What if I miss a dose? Keep appointments for follow-up doses. It is important not to miss your dose. Call your care team if you are unable to keep an appointment. What may interact with this medication? Fluoroestradiol F18 This list may not describe all possible interactions. Give your health care provider a list of all the medicines, herbs, non-prescription drugs, or dietary supplements you use. Also tell them if you smoke, drink alcohol, or use illegal drugs. Some items may interact with your medicine. What should I watch for while using this medication? Your condition will be monitored carefully while you are receiving this medication. You may need blood work done while you are taking this medication. This medication is injected into a muscle. Talk  to your care team if you also take medications that prevent or treat blood clots, such as warfarin. Blood thinners may increase the risk of bleeding or bruising in the muscle where this medication is injected. The benefits of this medication may outweigh the risks. Your care team can help you find the option that works for you. They can also help limit the risk of bleeding. Talk to your care team if you may be pregnant. Serious birth defects can occur if you take this medication during pregnancy and for 1 year after the last dose. You will need a negative pregnancy test before starting this medication. Contraception is recommended while taking this medication and for 1 year after the last dose. Your care team can help you find the option that works for you. Do not breastfeed while taking this medication and for 1 year after the last dose. This medication may cause infertility. Talk to your care team if you are concerned about your fertility. What side effects may I notice from receiving this medication? Side effects that you should report to your care team as soon as possible: Allergic reactions or angioedema--skin rash, itching or hives, swelling of the face, eyes, lips, tongue, arms, or legs, trouble swallowing or breathing Pain, tingling, or numbness in the hands or feet Side effects that usually do not require medical attention (report to your care team if they continue or are bothersome): Bone, joint, or muscle pain Constipation Headache Hot flashes Nausea Pain, redness, or irritation at injection site Unusual weakness or fatigue This list may not describe all possible side effects. Call your doctor for medical advice about side  effects. You may report side effects to FDA at 1-800-FDA-1088. Where should I keep my medication? This medication is given in a hospital or clinic. It will not be stored at home. NOTE: This sheet is a summary. It may not cover all possible information. If you have  questions about this medicine, talk to your doctor, pharmacist, or health care provider.  2024 Elsevier/Gold Standard (2022-12-30 00:00:00)

## 2023-09-02 LAB — CANCER ANTIGEN 27.29: CA 27.29: 70.2 U/mL — ABNORMAL HIGH (ref 0.0–38.6)

## 2023-09-07 DIAGNOSIS — Z9012 Acquired absence of left breast and nipple: Secondary | ICD-10-CM | POA: Diagnosis not present

## 2023-09-07 DIAGNOSIS — C50911 Malignant neoplasm of unspecified site of right female breast: Secondary | ICD-10-CM | POA: Diagnosis not present

## 2023-09-22 ENCOUNTER — Other Ambulatory Visit: Payer: Self-pay | Admitting: Hematology & Oncology

## 2023-09-22 ENCOUNTER — Other Ambulatory Visit: Payer: Self-pay | Admitting: Pharmacy Technician

## 2023-09-22 ENCOUNTER — Other Ambulatory Visit: Payer: Self-pay

## 2023-09-22 ENCOUNTER — Other Ambulatory Visit (HOSPITAL_COMMUNITY): Payer: Self-pay

## 2023-09-22 MED ORDER — IBRANCE 100 MG PO TABS
ORAL_TABLET | ORAL | 0 refills | Status: DC
  Filled 2023-09-22: qty 21, 28d supply, fill #0

## 2023-09-22 NOTE — Progress Notes (Signed)
 Specialty Pharmacy Refill Coordination Note  Caitlin Greene is a 58 y.o. female contacted today regarding refills of specialty medication(s) Palbociclib  (Ibrance )   Patient requested Delivery   Delivery date: 09/27/23   Verified address: [address], [place], [place] [address]   Medication will be filled on 09/26/23.  This fill date is pending response to refill request from provider. Patient is aware and if they have not received fill by intended date they must follow up with pharmacy.

## 2023-09-25 ENCOUNTER — Other Ambulatory Visit: Payer: Self-pay

## 2023-09-25 ENCOUNTER — Ambulatory Visit (HOSPITAL_COMMUNITY)
Admission: RE | Admit: 2023-09-25 | Discharge: 2023-09-25 | Disposition: A | Discharge status: Home / Self Care | Source: Ambulatory Visit | Attending: Hematology & Oncology | Admitting: Hematology & Oncology

## 2023-09-25 DIAGNOSIS — C50919 Malignant neoplasm of unspecified site of unspecified female breast: Secondary | ICD-10-CM

## 2023-09-25 DIAGNOSIS — C7951 Secondary malignant neoplasm of bone: Secondary | ICD-10-CM

## 2023-09-25 DIAGNOSIS — M19071 Primary osteoarthritis, right ankle and foot: Secondary | ICD-10-CM | POA: Diagnosis not present

## 2023-09-25 DIAGNOSIS — K449 Diaphragmatic hernia without obstruction or gangrene: Secondary | ICD-10-CM | POA: Diagnosis not present

## 2023-09-25 DIAGNOSIS — M170 Bilateral primary osteoarthritis of knee: Secondary | ICD-10-CM | POA: Diagnosis not present

## 2023-09-25 DIAGNOSIS — K8020 Calculus of gallbladder without cholecystitis without obstruction: Secondary | ICD-10-CM | POA: Diagnosis not present

## 2023-09-25 MED ORDER — IOHEXOL 300 MG/ML  SOLN
100.0000 mL | Freq: Once | INTRAMUSCULAR | Status: AC | PRN
  Administered 2023-09-25: 100 mL via INTRAVENOUS

## 2023-09-25 MED ORDER — TECHNETIUM TC 99M MEDRONATE IV KIT
20.0000 | PACK | Freq: Once | INTRAVENOUS | Status: AC | PRN
  Administered 2023-09-25: 19.4 via INTRAVENOUS

## 2023-09-25 MED ORDER — SODIUM CHLORIDE (PF) 0.9 % IJ SOLN
INTRAMUSCULAR | Status: AC
  Filled 2023-09-25: qty 50

## 2023-09-25 MED ORDER — HEPARIN SOD (PORK) LOCK FLUSH 100 UNIT/ML IV SOLN
500.0000 [IU] | Freq: Once | INTRAVENOUS | Status: AC
  Administered 2023-09-25: 500 [IU] via INTRAVENOUS

## 2023-09-26 ENCOUNTER — Other Ambulatory Visit (HOSPITAL_COMMUNITY): Payer: Self-pay

## 2023-09-26 ENCOUNTER — Other Ambulatory Visit: Payer: Self-pay

## 2023-09-26 DIAGNOSIS — C50911 Malignant neoplasm of unspecified site of right female breast: Secondary | ICD-10-CM | POA: Diagnosis not present

## 2023-09-26 DIAGNOSIS — Z9012 Acquired absence of left breast and nipple: Secondary | ICD-10-CM | POA: Diagnosis not present

## 2023-09-27 ENCOUNTER — Ambulatory Visit: Payer: Self-pay | Admitting: Hematology & Oncology

## 2023-09-27 ENCOUNTER — Encounter: Payer: Self-pay | Admitting: *Deleted

## 2023-10-04 ENCOUNTER — Inpatient Hospital Stay: Attending: Hematology & Oncology

## 2023-10-04 ENCOUNTER — Other Ambulatory Visit: Payer: Self-pay

## 2023-10-04 ENCOUNTER — Inpatient Hospital Stay

## 2023-10-04 ENCOUNTER — Inpatient Hospital Stay (HOSPITAL_BASED_OUTPATIENT_CLINIC_OR_DEPARTMENT_OTHER): Admitting: Hematology & Oncology

## 2023-10-04 ENCOUNTER — Encounter: Payer: Self-pay | Admitting: Hematology & Oncology

## 2023-10-04 VITALS — BP 106/78 | HR 75 | Temp 97.6°F | Resp 16 | Ht 68.0 in | Wt 189.0 lb

## 2023-10-04 DIAGNOSIS — D519 Vitamin B12 deficiency anemia, unspecified: Secondary | ICD-10-CM

## 2023-10-04 DIAGNOSIS — D509 Iron deficiency anemia, unspecified: Secondary | ICD-10-CM | POA: Insufficient documentation

## 2023-10-04 DIAGNOSIS — Z923 Personal history of irradiation: Secondary | ICD-10-CM | POA: Diagnosis not present

## 2023-10-04 DIAGNOSIS — E538 Deficiency of other specified B group vitamins: Secondary | ICD-10-CM | POA: Diagnosis not present

## 2023-10-04 DIAGNOSIS — C50919 Malignant neoplasm of unspecified site of unspecified female breast: Secondary | ICD-10-CM

## 2023-10-04 DIAGNOSIS — Z79818 Long term (current) use of other agents affecting estrogen receptors and estrogen levels: Secondary | ICD-10-CM | POA: Diagnosis not present

## 2023-10-04 DIAGNOSIS — C7951 Secondary malignant neoplasm of bone: Secondary | ICD-10-CM | POA: Insufficient documentation

## 2023-10-04 DIAGNOSIS — C50912 Malignant neoplasm of unspecified site of left female breast: Secondary | ICD-10-CM | POA: Diagnosis not present

## 2023-10-04 DIAGNOSIS — Z170 Estrogen receptor positive status [ER+]: Secondary | ICD-10-CM | POA: Insufficient documentation

## 2023-10-04 LAB — CBC WITH DIFFERENTIAL (CANCER CENTER ONLY)
Abs Immature Granulocytes: 0 10*3/uL (ref 0.00–0.07)
Basophils Absolute: 0 10*3/uL (ref 0.0–0.1)
Basophils Relative: 2 %
Eosinophils Absolute: 0.1 10*3/uL (ref 0.0–0.5)
Eosinophils Relative: 4 %
HCT: 33.1 % — ABNORMAL LOW (ref 36.0–46.0)
Hemoglobin: 11.6 g/dL — ABNORMAL LOW (ref 12.0–15.0)
Immature Granulocytes: 0 %
Lymphocytes Relative: 29 %
Lymphs Abs: 0.5 10*3/uL — ABNORMAL LOW (ref 0.7–4.0)
MCH: 36.9 pg — ABNORMAL HIGH (ref 26.0–34.0)
MCHC: 35 g/dL (ref 30.0–36.0)
MCV: 105.4 fL — ABNORMAL HIGH (ref 80.0–100.0)
Monocytes Absolute: 0.2 10*3/uL (ref 0.1–1.0)
Monocytes Relative: 12 %
Neutro Abs: 0.9 10*3/uL — ABNORMAL LOW (ref 1.7–7.7)
Neutrophils Relative %: 53 %
Platelet Count: 131 10*3/uL — ABNORMAL LOW (ref 150–400)
RBC: 3.14 MIL/uL — ABNORMAL LOW (ref 3.87–5.11)
RDW: 15.1 % (ref 11.5–15.5)
WBC Count: 1.7 10*3/uL — ABNORMAL LOW (ref 4.0–10.5)
nRBC: 0 % (ref 0.0–0.2)

## 2023-10-04 LAB — CMP (CANCER CENTER ONLY)
ALT: 11 U/L (ref 0–44)
AST: 12 U/L — ABNORMAL LOW (ref 15–41)
Albumin: 4.6 g/dL (ref 3.5–5.0)
Alkaline Phosphatase: 42 U/L (ref 38–126)
Anion gap: 8 (ref 5–15)
BUN: 10 mg/dL (ref 6–20)
CO2: 26 mmol/L (ref 22–32)
Calcium: 9 mg/dL (ref 8.9–10.3)
Chloride: 106 mmol/L (ref 98–111)
Creatinine: 0.95 mg/dL (ref 0.44–1.00)
GFR, Estimated: >60 mL/min (ref >60)
Glucose, Bld: 92 mg/dL (ref 70–99)
Potassium: 4 mmol/L (ref 3.5–5.1)
Sodium: 140 mmol/L (ref 135–145)
Total Bilirubin: 0.6 mg/dL (ref 0.0–1.2)
Total Protein: 6.5 g/dL (ref 6.5–8.1)

## 2023-10-04 LAB — VITAMIN B12: Vitamin B-12: 370 pg/mL (ref 180–914)

## 2023-10-04 LAB — IRON AND IRON BINDING CAPACITY (CC-WL,HP ONLY)
Iron: 87 ug/dL (ref 28–170)
Saturation Ratios: 28 % (ref 10.4–31.8)
TIBC: 312 ug/dL (ref 250–450)
UIBC: 225 ug/dL (ref 148–442)

## 2023-10-04 LAB — FERRITIN: Ferritin: 193 ng/mL (ref 11–307)

## 2023-10-04 LAB — LACTATE DEHYDROGENASE: LDH: 199 U/L — ABNORMAL HIGH (ref 98–192)

## 2023-10-04 MED ORDER — FULVESTRANT 250 MG/5ML IM SOSY
500.0000 mg | PREFILLED_SYRINGE | INTRAMUSCULAR | Status: DC
  Administered 2023-10-04: 500 mg via INTRAMUSCULAR
  Filled 2023-10-04: qty 10

## 2023-10-04 MED ORDER — CYANOCOBALAMIN 1000 MCG/ML IJ SOLN
1000.0000 ug | Freq: Once | INTRAMUSCULAR | Status: AC
  Administered 2023-10-04: 1000 ug via INTRAMUSCULAR
  Filled 2023-10-04: qty 1

## 2023-10-04 NOTE — Progress Notes (Signed)
 Hematology and Oncology Follow Up Visit  Caitlin Greene [id number] 09/05/1965 58 y.o. 10/04/2023   Principle Diagnosis:  Metastatic breast cancer-ER positive, PR negative/HER-2 negative --bone metastasis only Iron  deficiency anemia Vitamin B12 deficiency  Current Therapy:   Faslodex  500 mg IM monthly --start on 04/2020 -next dose on 09/2023 Ibrance  100 mg p.o. daily (21d on/7d off) - start on 04/2020 Xgeva  120 mg subcu every 3 months -next dose 11/2023  IV iron -Venofer  given on 10/21/2022   Status post XRT for right sacral lesion -completed on 03/03/2023 -- 3000 rad Vitamin B12 1000 mcg IM monthly --start on 06/2023     Interim History:  Caitlin Greene is in for follow-up.  I am less, she looks quite good.  She and her friend were down at the beach.  Had a wonderful time down at the beach.  She had a wonderful Memorial Day weekend.  We did go ahead and do scans on her.  She had both CT scan and bone scan.  The bone scan was done on 09/25/2023.  This did seem to show some increase in activity and some bone lesions.  Possibly, she had some increase in foci at T12 and L4.  Also, there may have been some activity in the right pedicle of S1.  No she has had radiotherapy in the past.  She had a CT scan.  The CT scan was done on 09/25/2023.  The CT scan did not show anything that looked like there was disease progression.  Her last CA 27.29 has been going up slowly.  Last time that we checked it was up to 70.  It is possible that we are going to have to make an adjustment to her protocol.  We will see what her CA 27.29 is.  If this is up further, we may have to have her come in for a liquid biopsy to see if there is any actionable mutations that we might be able to target.  She has had a little bit of pain in the right hip area..  She has had no change in bowel or bladder habits.  She has had no leg swelling.  She has had no rashes.  She has had no cough or shortness of breath.  She has had no obvious  change in bowel or bladder habits.  Of note, her last vitamin B12 level in April was 828 pg/mL.  Her iron  studies today show a iron  saturation of 28%.  Overall, I would have to say that her performance status is probably ECOG 1.       Wt Readings from Last 3 Encounters:  10/04/23 189 lb (85.7 kg)  09/01/23 194 lb (88 kg)  08/10/23 201 lb (91.2 kg)    Medications:  Current Outpatient Medications:    acetaminophen  (TYLENOL ) 650 MG CR tablet, Take 650 mg by mouth every 8 (eight) hours as needed for pain., Disp: , Rfl:    calcium  carbonate (OS-CAL) 600 MG tablet, Take 1 tablet (600 mg total) by mouth 2 (two) times daily., Disp: 30 tablet, Rfl: 0   Ferrous Sulfate (IRON ) 325 (65 Fe) MG TABS, Take 1 tablet (325 mg total) by mouth daily at 2 PM., Disp: 30 tablet, Rfl: 0   FLUoxetine  (PROZAC ) 20 MG capsule, TAKE 3 CAPSULES(60 MG) BY MOUTH DAILY, Disp: 270 capsule, Rfl: 3   fulvestrant  (FASLODEX ) 250 MG/5ML injection, Inject 500 mg into the muscle every 30 (thirty) days. One injection each buttock over 1-2 minutes. Warm prior to use.,  Disp: , Rfl:    IBRANCE  100 MG tablet, TAKE 1 TABLET DAILY ON DAYS 1 THROUGH 21 OF CYCLE, 7 DAYS OFF. REPEAT, Disp: 21 tablet, Rfl: 0   lidocaine -prilocaine  (EMLA ) cream, Apply 1 Application topically as needed. (Patient not taking: Reported on 09/01/2023), Disp: 30 g, Rfl: 0   LORazepam  (ATIVAN ) 1 MG tablet, Take 1 tablet (1 mg total) by mouth once as needed for up to 1 dose for anxiety. Take 1 tablet (1 mg total) by mouth 20 minutes before MRI scan. Please have a driver for this test as this medication has sedating effects., Disp: 1 tablet, Rfl: 0   oxyCODONE  (OXY IR/ROXICODONE ) 5 MG immediate release tablet, Take 1 tablet (5 mg total) by mouth every 8 (eight) hours as needed for severe pain., Disp: 50 tablet, Rfl: 0   pantoprazole  (PROTONIX ) 40 MG tablet, Take 1 tablet (40 mg total) by mouth 2 (two) times daily., Disp: 60 tablet, Rfl: 5   vitamin B-12  (CYANOCOBALAMIN ) 1000 MCG tablet, Take 1 tablet (1,000 mcg total) by mouth daily., Disp: 30 tablet, Rfl: 0   Wheat Dextrin (BENEFIBER DRINK MIX PO), Take by mouth daily at 6 (six) AM., Disp: , Rfl:  No current facility-administered medications for this visit.  Facility-Administered Medications Ordered in Other Visits:    fulvestrant  (FASLODEX ) injection 500 mg, 500 mg, Intramuscular, Q30 days, Ivor Mars, MD, 500 mg at 10/04/23 1129  Allergies:  Allergies  Allergen Reactions   Amoxicillin-Pot Clavulanate Hives and Itching   Iron  Anaphylaxis and Other (See Comments)    Patient develops hypotension, resp distress after infusion of feraheme .    Penicillins Hives, Itching and Other (See Comments)   Codeine Nausea And Vomiting    Needs pre-meds     Past Medical History, Surgical history, Social history, and Family History were reviewed and updated.  Review of Systems: Review of Systems  Constitutional:  Negative for appetite change.  HENT:  Negative.    Eyes: Negative.   Respiratory: Negative.    Cardiovascular: Negative.   Gastrointestinal:  Negative for abdominal pain and nausea.  Endocrine: Negative.   Genitourinary: Negative.    Musculoskeletal:  Positive for arthralgias and back pain.  Skin: Negative.   Neurological:  Negative for dizziness.  Hematological: Negative.   Psychiatric/Behavioral: Negative.      Physical Exam:  height is 5\' 8"  (1.727 m) and weight is 189 lb (85.7 kg). Her oral temperature is 97.6 F (36.4 C). Her blood pressure is 106/78 and her pulse is 75. Her respiration is 16 and oxygen saturation is 99%.   Wt Readings from Last 3 Encounters:  10/04/23 189 lb (85.7 kg)  09/01/23 194 lb (88 kg)  08/10/23 201 lb (91.2 kg)    Physical Exam Vitals reviewed.  HENT:     Head: Normocephalic and atraumatic.  Eyes:     Pupils: Pupils are equal, round, and reactive to light.  Cardiovascular:     Rate and Rhythm: Normal rate and regular rhythm.      Heart sounds: Normal heart sounds.  Pulmonary:     Effort: Pulmonary effort is normal.     Breath sounds: Normal breath sounds.  Abdominal:     General: Bowel sounds are normal.     Palpations: Abdomen is soft.  Musculoskeletal:        General: No tenderness or deformity. Normal range of motion.     Cervical back: Normal range of motion.  Lymphadenopathy:     Cervical: No cervical adenopathy.  Skin:    General: Skin is warm and dry.     Findings: No erythema or rash.  Neurological:     Mental Status: She is alert and oriented to person, place, and time.  Psychiatric:        Behavior: Behavior normal.        Thought Content: Thought content normal.        Judgment: Judgment normal.     Lab Results  Component Value Date   WBC 1.7 (L) 10/04/2023   HGB 11.6 (L) 10/04/2023   HCT 33.1 (L) 10/04/2023   MCV 105.4 (H) 10/04/2023   PLT 131 (L) 10/04/2023     Chemistry      Component Value Date/Time   NA 140 10/04/2023 1036   K 4.0 10/04/2023 1036   CL 106 10/04/2023 1036   CO2 26 10/04/2023 1036   BUN 10 10/04/2023 1036   CREATININE 0.95 10/04/2023 1036   CREATININE 0.88 10/22/2019 1246      Component Value Date/Time   CALCIUM  9.0 10/04/2023 1036   ALKPHOS 42 10/04/2023 1036   AST 12 (L) 10/04/2023 1036   ALT 11 10/04/2023 1036   BILITOT 0.6 10/04/2023 1036      Impression and Plan: Caitlin Greene is a very charming 58 year old postmenopausal female with metastatic breast cancer.  We actually had seen her many years ago with a carcinoma in situ.  She has she has done incredibly well with her metastatic breast cancer.  We have only had her on antiestrogen therapy.  This been going on for 4 years.  Again, we may be seeing a change in her disease.  We will see what the CA 27.29 is.  Again if is elevated, we will get a liquid biopsy.  We will then see if there is any actionable mutations that we might be able to utilize.  Of note, she will get her vitamin B12 today.  She is  not due for Xgeva  probably till July.  We will have her come back in another month, or sooner if necessary.

## 2023-10-04 NOTE — Patient Instructions (Signed)
 Fulvestrant Injection What is this medication? FULVESTRANT (ful VES trant) treats breast cancer. It works by blocking the hormone estrogen in breast tissue, which prevents breast cancer cells from spreading or growing. This medicine may be used for other purposes; ask your health care provider or pharmacist if you have questions. COMMON BRAND NAME(S): FASLODEX What should I tell my care team before I take this medication? They need to know if you have any of these conditions: Bleeding disorder Liver disease Low blood cell levels (white cells, red cells, and platelets) An unusual or allergic reaction to fulvestrant, other medications, foods, dyes, or preservatives Pregnant or trying to get pregnant Breastfeeding How should I use this medication? This medication is injected into a muscle. It is given by your care team in a hospital or clinic setting. Talk to your care team about the use of this medication in children. Special care may be needed. Overdosage: If you think you have taken too much of this medicine contact a poison control center or emergency room at once. NOTE: This medicine is only for you. Do not share this medicine with others. What if I miss a dose? Keep appointments for follow-up doses. It is important not to miss your dose. Call your care team if you are unable to keep an appointment. What may interact with this medication? Fluoroestradiol F18 This list may not describe all possible interactions. Give your health care provider a list of all the medicines, herbs, non-prescription drugs, or dietary supplements you use. Also tell them if you smoke, drink alcohol, or use illegal drugs. Some items may interact with your medicine. What should I watch for while using this medication? Your condition will be monitored carefully while you are receiving this medication. You may need blood work done while you are taking this medication. This medication is injected into a muscle. Talk  to your care team if you also take medications that prevent or treat blood clots, such as warfarin. Blood thinners may increase the risk of bleeding or bruising in the muscle where this medication is injected. The benefits of this medication may outweigh the risks. Your care team can help you find the option that works for you. They can also help limit the risk of bleeding. Talk to your care team if you may be pregnant. Serious birth defects can occur if you take this medication during pregnancy and for 1 year after the last dose. You will need a negative pregnancy test before starting this medication. Contraception is recommended while taking this medication and for 1 year after the last dose. Your care team can help you find the option that works for you. Do not breastfeed while taking this medication and for 1 year after the last dose. This medication may cause infertility. Talk to your care team if you are concerned about your fertility. What side effects may I notice from receiving this medication? Side effects that you should report to your care team as soon as possible: Allergic reactions or angioedema--skin rash, itching or hives, swelling of the face, eyes, lips, tongue, arms, or legs, trouble swallowing or breathing Pain, tingling, or numbness in the hands or feet Side effects that usually do not require medical attention (report to your care team if they continue or are bothersome): Bone, joint, or muscle pain Constipation Headache Hot flashes Nausea Pain, redness, or irritation at injection site Unusual weakness or fatigue This list may not describe all possible side effects. Call your doctor for medical advice about side  effects. You may report side effects to FDA at 1-800-FDA-1088. Where should I keep my medication? This medication is given in a hospital or clinic. It will not be stored at home. NOTE: This sheet is a summary. It may not cover all possible information. If you have  questions about this medicine, talk to your doctor, pharmacist, or health care provider.  2024 Elsevier/Gold Standard (2022-12-30 00:00:00)

## 2023-10-05 LAB — CANCER ANTIGEN 27.29: CA 27.29: 68 U/mL — ABNORMAL HIGH (ref 0.0–38.6)

## 2023-10-11 ENCOUNTER — Encounter: Payer: Self-pay | Admitting: Hematology & Oncology

## 2023-10-11 ENCOUNTER — Other Ambulatory Visit (HOSPITAL_COMMUNITY): Payer: Self-pay

## 2023-10-11 ENCOUNTER — Telehealth: Payer: Self-pay | Admitting: Pharmacy Technician

## 2023-10-11 NOTE — Telephone Encounter (Signed)
 Oral Oncology Patient Advocate Encounter  Was successful in securing patient a $7,500 grant from [facility] to provide copayment coverage for Ibrance .  This will keep the out of pocket expense at $0.     [employer] ID: [id number]   The billing information is as follows and has been shared with [facility].    RxBin: [id number] PCN: [id number] Member ID: [id number] Group ID: [id number] Dates of Eligibility: 09/11/2023 through 09/09/2024  Fund:  Breast Cancer - Medicare Access  [place] (Patty) Benjaman Branch, CPhT  [facility], [place], [place]ine Done, [place] Oral Chemotherapy Patient Advocate Phone: [phone]  Fax: [phone]

## 2023-10-23 ENCOUNTER — Other Ambulatory Visit: Payer: Self-pay | Admitting: Hematology & Oncology

## 2023-10-23 ENCOUNTER — Other Ambulatory Visit: Payer: Self-pay

## 2023-10-23 MED ORDER — IBRANCE 100 MG PO TABS
ORAL_TABLET | ORAL | 0 refills | Status: DC
  Filled 2023-10-23: qty 21, fill #0
  Filled 2023-10-25: qty 21, 28d supply, fill #0

## 2023-10-25 ENCOUNTER — Other Ambulatory Visit: Payer: Self-pay

## 2023-10-25 ENCOUNTER — Other Ambulatory Visit (HOSPITAL_COMMUNITY): Payer: Self-pay

## 2023-10-25 NOTE — Progress Notes (Signed)
 Specialty Pharmacy Refill Coordination Note  Spoke with [place], [place] (Self).   Caitlin Greene is a 58 y.o. female contacted today regarding refills of specialty medication(s) Palbociclib  (Ibrance )  Next cycle approx: 11/06/23.   Patient requested: Delivery   Delivery date: 10/30/23   Verified address: [address]  [place] [address]  Medication will be filled on 10/27/23.

## 2023-10-26 ENCOUNTER — Other Ambulatory Visit: Payer: Self-pay

## 2023-11-03 ENCOUNTER — Inpatient Hospital Stay

## 2023-11-03 ENCOUNTER — Encounter: Payer: Self-pay | Admitting: Hematology & Oncology

## 2023-11-03 ENCOUNTER — Inpatient Hospital Stay: Attending: Hematology & Oncology

## 2023-11-03 ENCOUNTER — Inpatient Hospital Stay: Admitting: Hematology & Oncology

## 2023-11-03 VITALS — BP 101/81 | HR 84 | Temp 97.9°F | Resp 18 | Ht 68.0 in | Wt 187.0 lb

## 2023-11-03 DIAGNOSIS — C50919 Malignant neoplasm of unspecified site of unspecified female breast: Secondary | ICD-10-CM

## 2023-11-03 DIAGNOSIS — Z79818 Long term (current) use of other agents affecting estrogen receptors and estrogen levels: Secondary | ICD-10-CM | POA: Insufficient documentation

## 2023-11-03 DIAGNOSIS — D519 Vitamin B12 deficiency anemia, unspecified: Secondary | ICD-10-CM | POA: Diagnosis not present

## 2023-11-03 DIAGNOSIS — Z79899 Other long term (current) drug therapy: Secondary | ICD-10-CM | POA: Insufficient documentation

## 2023-11-03 DIAGNOSIS — C7951 Secondary malignant neoplasm of bone: Secondary | ICD-10-CM | POA: Insufficient documentation

## 2023-11-03 DIAGNOSIS — Z853 Personal history of malignant neoplasm of breast: Secondary | ICD-10-CM | POA: Insufficient documentation

## 2023-11-03 DIAGNOSIS — D62 Acute posthemorrhagic anemia: Secondary | ICD-10-CM | POA: Diagnosis not present

## 2023-11-03 DIAGNOSIS — E538 Deficiency of other specified B group vitamins: Secondary | ICD-10-CM | POA: Insufficient documentation

## 2023-11-03 DIAGNOSIS — Z923 Personal history of irradiation: Secondary | ICD-10-CM | POA: Insufficient documentation

## 2023-11-03 LAB — CMP (CANCER CENTER ONLY)
ALT: 11 U/L (ref 0–44)
AST: 11 U/L — ABNORMAL LOW (ref 15–41)
Albumin: 4.3 g/dL (ref 3.5–5.0)
Alkaline Phosphatase: 40 U/L (ref 38–126)
Anion gap: 7 (ref 5–15)
BUN: 15 mg/dL (ref 6–20)
CO2: 26 mmol/L (ref 22–32)
Calcium: 9 mg/dL (ref 8.9–10.3)
Chloride: 107 mmol/L (ref 98–111)
Creatinine: 0.94 mg/dL (ref 0.44–1.00)
GFR, Estimated: >60 mL/min (ref >60)
Glucose, Bld: 86 mg/dL (ref 70–99)
Potassium: 3.7 mmol/L (ref 3.5–5.1)
Sodium: 140 mmol/L (ref 135–145)
Total Bilirubin: 0.7 mg/dL (ref 0.0–1.2)
Total Protein: 6.1 g/dL — ABNORMAL LOW (ref 6.5–8.1)

## 2023-11-03 LAB — CBC WITH DIFFERENTIAL (CANCER CENTER ONLY)
Abs Immature Granulocytes: 0 10*3/uL (ref 0.00–0.07)
Basophils Absolute: 0 10*3/uL (ref 0.0–0.1)
Basophils Relative: 1 %
Eosinophils Absolute: 0 10*3/uL (ref 0.0–0.5)
Eosinophils Relative: 2 %
HCT: 29.9 % — ABNORMAL LOW (ref 36.0–46.0)
Hemoglobin: 10.6 g/dL — ABNORMAL LOW (ref 12.0–15.0)
Immature Granulocytes: 0 %
Lymphocytes Relative: 36 %
Lymphs Abs: 0.6 10*3/uL — ABNORMAL LOW (ref 0.7–4.0)
MCH: 37.5 pg — ABNORMAL HIGH (ref 26.0–34.0)
MCHC: 35.5 g/dL (ref 30.0–36.0)
MCV: 105.7 fL — ABNORMAL HIGH (ref 80.0–100.0)
Monocytes Absolute: 0.2 10*3/uL (ref 0.1–1.0)
Monocytes Relative: 13 %
Neutro Abs: 0.7 10*3/uL — ABNORMAL LOW (ref 1.7–7.7)
Neutrophils Relative %: 48 %
Platelet Count: 115 10*3/uL — ABNORMAL LOW (ref 150–400)
RBC: 2.83 MIL/uL — ABNORMAL LOW (ref 3.87–5.11)
RDW: 15.1 % (ref 11.5–15.5)
Smear Review: NORMAL
WBC Count: 1.6 10*3/uL — ABNORMAL LOW (ref 4.0–10.5)
nRBC: 0 % (ref 0.0–0.2)

## 2023-11-03 LAB — IRON AND IRON BINDING CAPACITY (CC-WL,HP ONLY)
Iron: 91 ug/dL (ref 28–170)
Saturation Ratios: 32 % — ABNORMAL HIGH (ref 10.4–31.8)
TIBC: 286 ug/dL (ref 250–450)
UIBC: 195 ug/dL (ref 148–442)

## 2023-11-03 LAB — FERRITIN: Ferritin: 168 ng/mL (ref 11–307)

## 2023-11-03 MED ORDER — SODIUM CHLORIDE 0.9% FLUSH
10.0000 mL | INTRAVENOUS | Status: DC | PRN
  Administered 2023-11-03: 10 mL via INTRAVENOUS

## 2023-11-03 MED ORDER — CYANOCOBALAMIN 1000 MCG/ML IJ SOLN
1000.0000 ug | INTRAMUSCULAR | Status: DC
  Administered 2023-11-03: 1000 ug via INTRAMUSCULAR
  Filled 2023-11-03: qty 1

## 2023-11-03 MED ORDER — FULVESTRANT 250 MG/5ML IM SOSY
500.0000 mg | PREFILLED_SYRINGE | INTRAMUSCULAR | Status: DC
  Administered 2023-11-03: 500 mg via INTRAMUSCULAR
  Filled 2023-11-03: qty 10

## 2023-11-03 MED ORDER — HEPARIN SOD (PORK) LOCK FLUSH 100 UNIT/ML IV SOLN
500.0000 [IU] | Freq: Once | INTRAVENOUS | Status: AC
  Administered 2023-11-03: 500 [IU] via INTRAVENOUS

## 2023-11-03 NOTE — Progress Notes (Signed)
 Hematology and Oncology Follow Up Visit  Caitlin Greene [id number] Oct 07, 1965 58 y.o. 11/03/2023   Principle Diagnosis:  Metastatic breast cancer-ER positive, PR negative/HER-2 negative --bone metastasis only Iron  deficiency anemia Vitamin B12 deficiency  Current Therapy:   Faslodex  500 mg IM monthly --start on 04/2020 -next dose on 09/2023 Ibrance  100 mg p.o. daily (21d on/7d off) - start on 04/2020 Xgeva  120 mg subcu every 3 months -next dose 11/2023  IV iron -Venofer  given on 10/21/2022   Status post XRT for right sacral lesion -completed on 03/03/2023 -- 3000 rad Vitamin B12 1000 mcg IM monthly --start on 06/2023     Interim History:  Caitlin Greene is in for follow-up.  She really looks quite good.  She is exercising.  She is trying to lose weight.  I am very impressed by all of this.  She did have a nice time with her friend down at the beach back in May.  They enjoyed themselves.  She really is not complaining of any kind of bony pain that is new.  She does have the chronic back discomfort.  There is been no change in bowel or bladder habits.  She has had no cough or shortness of breath.  She has had no rashes.  There is been no leg swelling.  Her last CA 27.29 has been stable at 68.  She has had no bleeding.  There is been no headache.  Her iron  studies back in May showed a ferritin of 193 with iron  saturation of 28%.  Her appetite has been quite good.  Again she is trying to watch what she eats.  Her last vitamin B12 level was 370 in May.       Wt Readings from Last 3 Encounters:  11/03/23 187 lb (84.8 kg)  10/04/23 189 lb (85.7 kg)  09/01/23 194 lb (88 kg)    Medications:  Current Outpatient Medications:    acetaminophen  (TYLENOL ) 650 MG CR tablet, Take 650 mg by mouth every 8 (eight) hours as needed for pain., Disp: , Rfl:    calcium  carbonate (OS-CAL) 600 MG tablet, Take 1 tablet (600 mg total) by mouth 2 (two) times daily., Disp: 30 tablet, Rfl: 0   Ferrous  Sulfate (IRON ) 325 (65 Fe) MG TABS, Take 1 tablet (325 mg total) by mouth daily at 2 PM., Disp: 30 tablet, Rfl: 0   FLUoxetine  (PROZAC ) 20 MG capsule, TAKE 3 CAPSULES(60 MG) BY MOUTH DAILY, Disp: 270 capsule, Rfl: 3   fulvestrant  (FASLODEX ) 250 MG/5ML injection, Inject 500 mg into the muscle every 30 (thirty) days. One injection each buttock over 1-2 minutes. Warm prior to use., Disp: , Rfl:    IBRANCE  100 MG tablet, TAKE 1 TABLET DAILY ON DAYS 1 THROUGH 21 OF CYCLE, 7 DAYS OFF. REPEAT, Disp: 21 tablet, Rfl: 0   lidocaine -prilocaine  (EMLA ) cream, Apply 1 Application topically as needed., Disp: 30 g, Rfl: 0   LORazepam  (ATIVAN ) 1 MG tablet, Take 1 tablet (1 mg total) by mouth once as needed for up to 1 dose for anxiety. Take 1 tablet (1 mg total) by mouth 20 minutes before MRI scan. Please have a driver for this test as this medication has sedating effects., Disp: 1 tablet, Rfl: 0   oxyCODONE  (OXY IR/ROXICODONE ) 5 MG immediate release tablet, Take 1 tablet (5 mg total) by mouth every 8 (eight) hours as needed for severe pain., Disp: 50 tablet, Rfl: 0   pantoprazole  (PROTONIX ) 40 MG tablet, Take 1 tablet (40 mg total) by mouth  2 (two) times daily., Disp: 60 tablet, Rfl: 5   vitamin B-12 (CYANOCOBALAMIN ) 1000 MCG tablet, Take 1 tablet (1,000 mcg total) by mouth daily., Disp: 30 tablet, Rfl: 0   Wheat Dextrin (BENEFIBER DRINK MIX PO), Take by mouth daily at 6 (six) AM., Disp: , Rfl:   Allergies:  Allergies  Allergen Reactions   Amoxicillin-Pot Clavulanate Hives and Itching   Iron  Anaphylaxis and Other (See Comments)    Patient develops hypotension, resp distress after infusion of feraheme .    Penicillins Hives, Itching and Other (See Comments)   Codeine Nausea And Vomiting    Needs pre-meds     Past Medical History, Surgical history, Social history, and Family History were reviewed and updated.  Review of Systems: Review of Systems  Constitutional:  Negative for appetite change.  HENT:   Negative.    Eyes: Negative.   Respiratory: Negative.    Cardiovascular: Negative.   Gastrointestinal:  Negative for abdominal pain and nausea.  Endocrine: Negative.   Genitourinary: Negative.    Musculoskeletal:  Positive for arthralgias and back pain.  Skin: Negative.   Neurological:  Negative for dizziness.  Hematological: Negative.   Psychiatric/Behavioral: Negative.      Physical Exam:  height is 5' 8 (1.727 m) and weight is 187 lb (84.8 kg). Her oral temperature is 97.9 F (36.6 C). Her blood pressure is 101/81 and her pulse is 84. Her respiration is 18 and oxygen saturation is 100%.   Wt Readings from Last 3 Encounters:  11/03/23 187 lb (84.8 kg)  10/04/23 189 lb (85.7 kg)  09/01/23 194 lb (88 kg)    Physical Exam Vitals reviewed.  HENT:     Head: Normocephalic and atraumatic.   Eyes:     Pupils: Pupils are equal, round, and reactive to light.    Cardiovascular:     Rate and Rhythm: Normal rate and regular rhythm.     Heart sounds: Normal heart sounds.  Pulmonary:     Effort: Pulmonary effort is normal.     Breath sounds: Normal breath sounds.  Abdominal:     General: Bowel sounds are normal.     Palpations: Abdomen is soft.   Musculoskeletal:        General: No tenderness or deformity. Normal range of motion.     Cervical back: Normal range of motion.  Lymphadenopathy:     Cervical: No cervical adenopathy.   Skin:    General: Skin is warm and dry.     Findings: No erythema or rash.   Neurological:     Mental Status: She is alert and oriented to person, place, and time.   Psychiatric:        Behavior: Behavior normal.        Thought Content: Thought content normal.        Judgment: Judgment normal.      Lab Results  Component Value Date   WBC 1.6 (L) 11/03/2023   HGB 10.6 (L) 11/03/2023   HCT 29.9 (L) 11/03/2023   MCV 105.7 (H) 11/03/2023   PLT 115 (L) 11/03/2023     Chemistry      Component Value Date/Time   NA 140 11/03/2023 1008    K 3.7 11/03/2023 1008   CL 107 11/03/2023 1008   CO2 26 11/03/2023 1008   BUN 15 11/03/2023 1008   CREATININE 0.94 11/03/2023 1008   CREATININE 0.88 10/22/2019 1246      Component Value Date/Time   CALCIUM  9.0 11/03/2023 1008  ALKPHOS 40 11/03/2023 1008   AST 11 (L) 11/03/2023 1008   ALT 11 11/03/2023 1008   BILITOT 0.7 11/03/2023 1008      Impression and Plan: Caitlin Greene is a very charming 58 year old postmenopausal female with metastatic breast cancer.  We actually had seen her many years ago with a carcinoma in situ.  She has she has done incredibly well with her metastatic breast cancer.  We have only had her on antiestrogen therapy.  This has been going on for 4 years.  I still do not see the need to make a change in her protocol.  I think everything looks pretty stable from my point of view.  She will get her Faslodex  today.  I will also make sure that she gets her vitamin B12 today.  We will follow her up monthly.  I will plan to see her back in July.  At that time, she will get her Xgeva .

## 2023-11-03 NOTE — Patient Instructions (Signed)

## 2023-11-03 NOTE — Patient Instructions (Signed)
 Fulvestrant Injection What is this medication? FULVESTRANT (ful VES trant) treats breast cancer. It works by blocking the hormone estrogen in breast tissue, which prevents breast cancer cells from spreading or growing. This medicine may be used for other purposes; ask your health care provider or pharmacist if you have questions. COMMON BRAND NAME(S): FASLODEX What should I tell my care team before I take this medication? They need to know if you have any of these conditions: Bleeding disorder Liver disease Low blood cell levels (white cells, red cells, and platelets) An unusual or allergic reaction to fulvestrant, other medications, foods, dyes, or preservatives Pregnant or trying to get pregnant Breastfeeding How should I use this medication? This medication is injected into a muscle. It is given by your care team in a hospital or clinic setting. Talk to your care team about the use of this medication in children. Special care may be needed. Overdosage: If you think you have taken too much of this medicine contact a poison control center or emergency room at once. NOTE: This medicine is only for you. Do not share this medicine with others. What if I miss a dose? Keep appointments for follow-up doses. It is important not to miss your dose. Call your care team if you are unable to keep an appointment. What may interact with this medication? Fluoroestradiol F18 This list may not describe all possible interactions. Give your health care provider a list of all the medicines, herbs, non-prescription drugs, or dietary supplements you use. Also tell them if you smoke, drink alcohol, or use illegal drugs. Some items may interact with your medicine. What should I watch for while using this medication? Your condition will be monitored carefully while you are receiving this medication. You may need blood work done while you are taking this medication. This medication is injected into a muscle. Talk  to your care team if you also take medications that prevent or treat blood clots, such as warfarin. Blood thinners may increase the risk of bleeding or bruising in the muscle where this medication is injected. The benefits of this medication may outweigh the risks. Your care team can help you find the option that works for you. They can also help limit the risk of bleeding. Talk to your care team if you may be pregnant. Serious birth defects can occur if you take this medication during pregnancy and for 1 year after the last dose. You will need a negative pregnancy test before starting this medication. Contraception is recommended while taking this medication and for 1 year after the last dose. Your care team can help you find the option that works for you. Do not breastfeed while taking this medication and for 1 year after the last dose. This medication may cause infertility. Talk to your care team if you are concerned about your fertility. What side effects may I notice from receiving this medication? Side effects that you should report to your care team as soon as possible: Allergic reactions or angioedema--skin rash, itching or hives, swelling of the face, eyes, lips, tongue, arms, or legs, trouble swallowing or breathing Pain, tingling, or numbness in the hands or feet Side effects that usually do not require medical attention (report to your care team if they continue or are bothersome): Bone, joint, or muscle pain Constipation Headache Hot flashes Nausea Pain, redness, or irritation at injection site Unusual weakness or fatigue This list may not describe all possible side effects. Call your doctor for medical advice about side  effects. You may report side effects to FDA at 1-800-FDA-1088. Where should I keep my medication? This medication is given in a hospital or clinic. It will not be stored at home. NOTE: This sheet is a summary. It may not cover all possible information. If you have  questions about this medicine, talk to your doctor, pharmacist, or health care provider.  2024 Elsevier/Gold Standard (2022-12-30 00:00:00)

## 2023-11-04 ENCOUNTER — Ambulatory Visit: Payer: Self-pay | Admitting: Hematology & Oncology

## 2023-11-04 LAB — CANCER ANTIGEN 27.29: CA 27.29: 75.8 U/mL — ABNORMAL HIGH (ref 0.0–38.6)

## 2023-11-23 ENCOUNTER — Other Ambulatory Visit (HOSPITAL_COMMUNITY): Payer: Self-pay

## 2023-11-23 ENCOUNTER — Other Ambulatory Visit: Payer: Self-pay

## 2023-11-23 ENCOUNTER — Encounter (INDEPENDENT_AMBULATORY_CARE_PROVIDER_SITE_OTHER): Payer: Self-pay

## 2023-11-23 ENCOUNTER — Other Ambulatory Visit: Payer: Self-pay | Admitting: Hematology & Oncology

## 2023-11-23 MED ORDER — IBRANCE 100 MG PO TABS
ORAL_TABLET | ORAL | 0 refills | Status: DC
  Filled 2023-11-23: qty 21, fill #0
  Filled 2023-11-23: qty 21, 28d supply, fill #0

## 2023-11-23 NOTE — Progress Notes (Signed)
 Specialty Pharmacy Refill Coordination Note  MyChart Questionnaire Submission  Caitlin Greene is a 57 y.o. female contacted today regarding refills of specialty medication(s) Ibrance .  Doses on hand/Next Cycle: (Patient-Rptd) 1 week left then 1 week off  Patient requested: (Patient-Rptd) Delivery   Delivery date: 11/27/23   Verified address: (Patient-Rptd) [address] [place] [address]  Medication will be filled on 11/24/23.

## 2023-12-01 ENCOUNTER — Inpatient Hospital Stay: Attending: Hematology & Oncology

## 2023-12-01 ENCOUNTER — Inpatient Hospital Stay

## 2023-12-01 ENCOUNTER — Ambulatory Visit: Payer: Self-pay | Admitting: Hematology & Oncology

## 2023-12-01 ENCOUNTER — Inpatient Hospital Stay: Admitting: Hematology & Oncology

## 2023-12-01 VITALS — BP 110/76 | HR 72 | Temp 97.8°F | Resp 17 | Wt 184.8 lb

## 2023-12-01 DIAGNOSIS — E538 Deficiency of other specified B group vitamins: Secondary | ICD-10-CM | POA: Diagnosis not present

## 2023-12-01 DIAGNOSIS — C50919 Malignant neoplasm of unspecified site of unspecified female breast: Secondary | ICD-10-CM | POA: Diagnosis not present

## 2023-12-01 DIAGNOSIS — Z1732 Human epidermal growth factor receptor 2 negative status: Secondary | ICD-10-CM | POA: Insufficient documentation

## 2023-12-01 DIAGNOSIS — C7951 Secondary malignant neoplasm of bone: Secondary | ICD-10-CM

## 2023-12-01 DIAGNOSIS — Z5111 Encounter for antineoplastic chemotherapy: Secondary | ICD-10-CM | POA: Diagnosis not present

## 2023-12-01 DIAGNOSIS — Z170 Estrogen receptor positive status [ER+]: Secondary | ICD-10-CM | POA: Insufficient documentation

## 2023-12-01 DIAGNOSIS — Z1722 Progesterone receptor negative status: Secondary | ICD-10-CM | POA: Diagnosis not present

## 2023-12-01 DIAGNOSIS — D519 Vitamin B12 deficiency anemia, unspecified: Secondary | ICD-10-CM

## 2023-12-01 DIAGNOSIS — D509 Iron deficiency anemia, unspecified: Secondary | ICD-10-CM | POA: Diagnosis not present

## 2023-12-01 DIAGNOSIS — Z95828 Presence of other vascular implants and grafts: Secondary | ICD-10-CM

## 2023-12-01 DIAGNOSIS — D62 Acute posthemorrhagic anemia: Secondary | ICD-10-CM

## 2023-12-01 LAB — CBC WITH DIFFERENTIAL (CANCER CENTER ONLY)
Abs Immature Granulocytes: 0.01 K/uL (ref 0.00–0.07)
Basophils Absolute: 0 K/uL (ref 0.0–0.1)
Basophils Relative: 1 %
Eosinophils Absolute: 0.1 K/uL (ref 0.0–0.5)
Eosinophils Relative: 4 %
HCT: 32.9 % — ABNORMAL LOW (ref 36.0–46.0)
Hemoglobin: 11.5 g/dL — ABNORMAL LOW (ref 12.0–15.0)
Immature Granulocytes: 1 %
Lymphocytes Relative: 30 %
Lymphs Abs: 0.6 K/uL — ABNORMAL LOW (ref 0.7–4.0)
MCH: 36.9 pg — ABNORMAL HIGH (ref 26.0–34.0)
MCHC: 35 g/dL (ref 30.0–36.0)
MCV: 105.4 fL — ABNORMAL HIGH (ref 80.0–100.0)
Monocytes Absolute: 0.2 K/uL (ref 0.1–1.0)
Monocytes Relative: 8 %
Neutro Abs: 1.1 K/uL — ABNORMAL LOW (ref 1.7–7.7)
Neutrophils Relative %: 56 %
Platelet Count: 123 K/uL — ABNORMAL LOW (ref 150–400)
RBC: 3.12 MIL/uL — ABNORMAL LOW (ref 3.87–5.11)
RDW: 13.9 % (ref 11.5–15.5)
Smear Review: NORMAL
WBC Count: 1.9 K/uL — ABNORMAL LOW (ref 4.0–10.5)
nRBC: 0 % (ref 0.0–0.2)

## 2023-12-01 LAB — FERRITIN: Ferritin: 69 ng/mL (ref 11–307)

## 2023-12-01 LAB — CMP (CANCER CENTER ONLY)
ALT: 14 U/L (ref 0–44)
AST: 14 U/L — ABNORMAL LOW (ref 15–41)
Albumin: 4.3 g/dL (ref 3.5–5.0)
Alkaline Phosphatase: 45 U/L (ref 38–126)
Anion gap: 10 (ref 5–15)
BUN: 15 mg/dL (ref 6–20)
CO2: 24 mmol/L (ref 22–32)
Calcium: 8.9 mg/dL (ref 8.9–10.3)
Chloride: 106 mmol/L (ref 98–111)
Creatinine: 0.92 mg/dL (ref 0.44–1.00)
GFR, Estimated: >60 mL/min (ref >60)
Glucose, Bld: 88 mg/dL (ref 70–99)
Potassium: 4.2 mmol/L (ref 3.5–5.1)
Sodium: 140 mmol/L (ref 135–145)
Total Bilirubin: 0.5 mg/dL (ref 0.0–1.2)
Total Protein: 6.7 g/dL (ref 6.5–8.1)

## 2023-12-01 LAB — IRON AND IRON BINDING CAPACITY (CC-WL,HP ONLY)
Iron: 96 ug/dL (ref 28–170)
Saturation Ratios: 30 % (ref 10.4–31.8)
TIBC: 322 ug/dL (ref 250–450)
UIBC: 226 ug/dL

## 2023-12-01 LAB — VITAMIN B12: Vitamin B-12: 299 pg/mL (ref 180–914)

## 2023-12-01 MED ORDER — SODIUM CHLORIDE 0.9% FLUSH
10.0000 mL | Freq: Once | INTRAVENOUS | Status: AC
  Administered 2023-12-01: 10 mL via INTRAVENOUS

## 2023-12-01 MED ORDER — CYANOCOBALAMIN 1000 MCG/ML IJ SOLN
1000.0000 ug | Freq: Once | INTRAMUSCULAR | Status: AC
  Administered 2023-12-01: 1000 ug via INTRAMUSCULAR
  Filled 2023-12-01: qty 1

## 2023-12-01 MED ORDER — HEPARIN SOD (PORK) LOCK FLUSH 100 UNIT/ML IV SOLN
500.0000 [IU] | Freq: Once | INTRAVENOUS | Status: AC
  Administered 2023-12-01: 500 [IU] via INTRAVENOUS

## 2023-12-01 MED ORDER — DENOSUMAB 120 MG/1.7ML ~~LOC~~ SOLN
120.0000 mg | Freq: Once | SUBCUTANEOUS | Status: AC
Start: 2023-12-01 — End: 2023-12-01
  Administered 2023-12-01: 120 mg via SUBCUTANEOUS
  Filled 2023-12-01: qty 1.7

## 2023-12-01 MED ORDER — FULVESTRANT 250 MG/5ML IM SOSY
500.0000 mg | PREFILLED_SYRINGE | Freq: Once | INTRAMUSCULAR | Status: AC
Start: 2023-12-01 — End: 2023-12-01
  Administered 2023-12-01: 500 mg via INTRAMUSCULAR
  Filled 2023-12-01: qty 10

## 2023-12-01 NOTE — Patient Instructions (Signed)
 Vitamin B12 Injection What is this medication? Vitamin B12 (VAHY tuh min B12) prevents and treats low vitamin B12 levels in your body. It is used in people who do not get enough vitamin B12 from their diet or when their digestive tract does not absorb enough. Vitamin B12 plays an important role in maintaining the health of your nervous system and red blood cells. This medicine may be used for other purposes; ask your health care provider or pharmacist if you have questions. COMMON BRAND NAME(S): B-12 Compliance Kit, B-12 Injection Kit, Cyomin, Dodex , LA-12, Nutri-Twelve, Physicians EZ Use B-12, Primabalt, Vitamin Deficiency Injectable System - B12 What should I tell my care team before I take this medication? They need to know if you have any of these conditions: Kidney disease Leber's disease Megaloblastic anemia An unusual or allergic reaction to cyanocobalamin , cobalt, other medications, foods, dyes, or preservatives Pregnant or trying to get pregnant Breast-feeding How should I use this medication? This medication is injected into a muscle or deeply under the skin. It is usually given in a clinic or care team's office. However, your care team may teach you how to inject yourself. Follow all instructions. Talk to your care team about the use of this medication in children. Special care may be needed. Overdosage: If you think you have taken too much of this medicine contact a poison control center or emergency room at once. NOTE: This medicine is only for you. Do not share this medicine with others. What if I miss a dose? If you are given your dose at a clinic or care team's office, call to reschedule your appointment. If you give your own injections, and you miss a dose, take it as soon as you can. If it is almost time for your next dose, take only that dose. Do not take double or extra doses. What may interact with this medication? Alcohol Colchicine This list may not describe all possible  interactions. Give your health care provider a list of all the medicines, herbs, non-prescription drugs, or dietary supplements you use. Also tell them if you smoke, drink alcohol, or use illegal drugs. Some items may interact with your medicine. What should I watch for while using this medication? Visit your care team regularly. You may need blood work done while you are taking this medication. You may need to follow a special diet. Talk to your care team. Limit your alcohol intake and avoid smoking to get the best benefit. What side effects may I notice from receiving this medication? Side effects that you should report to your care team as soon as possible: Allergic reactions--skin rash, itching, hives, swelling of the face, lips, tongue, or throat Swelling of the ankles, hands, or feet Trouble breathing Side effects that usually do not require medical attention (report to your care team if they continue or are bothersome): Diarrhea This list may not describe all possible side effects. Call your doctor for medical advice about side effects. You may report side effects to FDA at 1-800-FDA-1088. Where should I keep my medication? Keep out of the reach of children. Store at room temperature between 15 and 30 degrees C (59 and 85 degrees F). Protect from light. Throw away any unused medication after the expiration date. NOTE: This sheet is a summary. It may not cover all possible information. If you have questions about this medicine, talk to your doctor, pharmacist, or health care provider.  2024 Elsevier/Gold Standard (2021-01-05 00:00:00)Fulvestrant  Injection What is this medication? FULVESTRANT  (ful VES  trant) treats breast cancer. It works by blocking the hormone estrogen in breast tissue, which prevents breast cancer cells from spreading or growing. This medicine may be used for other purposes; ask your health care provider or pharmacist if you have questions. COMMON BRAND NAME(S):  FASLODEX  What should I tell my care team before I take this medication? They need to know if you have any of these conditions: Bleeding disorder Liver disease Low blood cell levels (white cells, red cells, and platelets) An unusual or allergic reaction to fulvestrant , other medications, foods, dyes, or preservatives Pregnant or trying to get pregnant Breastfeeding How should I use this medication? This medication is injected into a muscle. It is given by your care team in a hospital or clinic setting. Talk to your care team about the use of this medication in children. Special care may be needed. Overdosage: If you think you have taken too much of this medicine contact a poison control center or emergency room at once. NOTE: This medicine is only for you. Do not share this medicine with others. What if I miss a dose? Keep appointments for follow-up doses. It is important not to miss your dose. Call your care team if you are unable to keep an appointment. What may interact with this medication? Fluoroestradiol F18 This list may not describe all possible interactions. Give your health care provider a list of all the medicines, herbs, non-prescription drugs, or dietary supplements you use. Also tell them if you smoke, drink alcohol, or use illegal drugs. Some items may interact with your medicine. What should I watch for while using this medication? Your condition will be monitored carefully while you are receiving this medication. You may need blood work done while you are taking this medication. This medication is injected into a muscle. Talk to your care team if you also take medications that prevent or treat blood clots, such as warfarin. Blood thinners may increase the risk of bleeding or bruising in the muscle where this medication is injected. The benefits of this medication may outweigh the risks. Your care team can help you find the option that works for you. They can also help limit the  risk of bleeding. Talk to your care team if you may be pregnant. Serious birth defects can occur if you take this medication during pregnancy and for 1 year after the last dose. You will need a negative pregnancy test before starting this medication. Contraception is recommended while taking this medication and for 1 year after the last dose. Your care team can help you find the option that works for you. Do not breastfeed while taking this medication and for 1 year after the last dose. This medication may cause infertility. Talk to your care team if you are concerned about your fertility. What side effects may I notice from receiving this medication? Side effects that you should report to your care team as soon as possible: Allergic reactions or angioedema--skin rash, itching or hives, swelling of the face, eyes, lips, tongue, arms, or legs, trouble swallowing or breathing Pain, tingling, or numbness in the hands or feet Side effects that usually do not require medical attention (report to your care team if they continue or are bothersome): Bone, joint, or muscle pain Constipation Headache Hot flashes Nausea Pain, redness, or irritation at injection site Unusual weakness or fatigue This list may not describe all possible side effects. Call your doctor for medical advice about side effects. You may report side effects to FDA  at 1-800-FDA-1088. Where should I keep my medication? This medication is given in a hospital or clinic. It will not be stored at home. NOTE: This sheet is a summary. It may not cover all possible information. If you have questions about this medicine, talk to your doctor, pharmacist, or health care provider.  2024 Elsevier/Gold Standard (2022-12-30 00:00:00)Denosumab  Injection (Oncology) Quel est ce mdicament ? Le DNOSUMAB prvient l?affaiblissement des os caus par le cancer. Il peut galement tre utilis pour traiter les tumeurs osseuses non cancreuses lorsque leur  ablation chirurgicale est impossible. Il peut galement tre utilis pour traiter les taux levs de calcium  sanguin causs par le cancer. Il agit en bloquant une protine qui provoque la dgradation rapide des os. Cela ralentit la libration de calcium  par les os, ce qui abaisse les taux de calcium  [employer] sang. Il renforce galement vos os et les rend moins susceptibles de se casser (fracture). Ce mdicament peut tre utilis pour d'autres indications ; adressez-vous  votre mdecin ou  votre pharmacien si vous avez des questions. NOM(S) DE MARQUE COURANT(S) : XGEVA  Que dois-je dire  mon fournisseur de Consulting civil engineer de prendre ce mdicament ? Votre quipe de soins a besoin de savoir si vous tes dans l?une des situations suivantes : Scientist, product/process development ou extraction dentaire Infection Maladie rnale Faibles taux de calcium  ou de vitamine D dans le sang Malnutrition Sous hmodialyse Affections ou sensibilit cutanes Maladie thyrodienne ou parathyrodienne Raction inhabituelle ou allergique au dnosumab,  d?autres mdicaments,  des aliments,  des colorants ou  des conservateurs [place] ou projet de grossesse Allaitement Comment dois-je utiliser ce mdicament ? Ce mdicament est destin  tre inject sous la peau. Il est administr par votre quipe de [employer] un tablissement [employer] ou un Biochemist, clinical. Un guide spcial (MedGuide) vous sera fourni avant chaque traitement. Arlinda  lire attentivement [employer]. Pour toute information relative  l'utilisation de ce mdicament [employer], consultez votre quipe de [place]. Bien qu'il puisse tre prescrit pour certaines affections  des enfants gs de 13 ans seulement, des prcautions s'imposent. Surdosage : si vous pensez avoir pris ce mdicament en excs, contactez immdiatement un centre [employer].<br />REMARQUE : ce mdicament vous est uniquement destin.  Ne partagez pas ce Arts administrator. Que faire si je saute une dose ? Prsentez-vous  tous les rendez-vous pour recevoir les doses suivantes. Il est important de ne pas omettre votre dose. Appelez votre quipe de soins si vous ne pouvez pas Barrister's clerk. Quelles sont les interactions possibles avec ce mdicament ? Ne prenez pas ce [employer] substances suivantes : Autres mdicaments contenant du dnosumab Ce mdicament peut galement interagir avec les substances suivantes : Mdicaments qui rduisent votre probabilit de combattre une infection Mdicaments strodiens, tels que la prednisone  ou la cortisone Cette liste peut ne pas dcrire toutes les interactions mdicamenteuses possibles. Donnez  votre fournisseur de [employer] de tous les mdicaments, plantes mdicinales, mdicaments en vente libre ou complments alimentaires que vous prenez. Dites-lui aussi si vous fumez, buvez de l'alcool ou consommez des drogues illicites. Certaines substances peuvent interagir avec votre mdicament. Que dois-je IT consultant par ce mdicament ? Votre tat de sant sera surveill attentivement durant le traitement par [facility]. Vous devrez Geographical information systems officer des analyses de sang Art therapist par [facility] [facility] [employer]. Ce mdicament peut accrotre votre risque de contracter une infection. Demandez conseil  votre quipe de [place]  si vous avez de la fivre, des frissons, un mal de gorge ou d'autres symptmes de rhume ou de grippe. Ne pratiquez pas l?automdication. vitez de vous approcher des [employer]. Assurez-vous d?avoir un apport suffisant en calcium  et vitamine D durant le traitement par ce mdicament, sauf instructions contraires de la part de votre quipe de Rodney. Discutez de votre alimentation et des vitamines que vous prenez avec votre quipe de [place]. Certains sujets sous traitement par ce mdicament souffrent de  douleurs svres au niveau des os, des articulations ou des muscles. Ce mdicament peut galement accrotre votre risque de problmes  la mchoire ou de fracture du fmur. Avertissez immdiatement votre quipe de soins si vous dveloppez une douleur svre  la mchoire, aux os, aux articulations ou aux muscles. Avertissez votre quipe de soins si vous ressentez une douleur qui persiste ou s'aggrave. Informez votre quipe de soins si vous pourriez tre enceinte. Des anomalies congnitales graves peuvent survenir si vous prenez ce mdicament pendant la grossesse et au cours des 5 mois qui suivent l?administration de la dernire dose. Vous devrez effectuer un test de grossesse et avoir un rsultat ngatif avant de Marketing executive par [facility]. Il est recommand d?utiliser une mthode de contraception Warehouse manager par ce mdicament et au cours des 5 mois qui suivent la dernire dose. Votre quipe de [employer] vous aider  trouver l?option qui vous convient. Quels effets secondaires puis-je remarquer en prenant ce mdicament ? Effets secondaires que vous devez signaler  votre quipe de soins ds que possible : Ractions allergiques : ruption cutane, dmangeaisons, urticaire, gonflement du visage, des lvres, de la langue ou de la gorge Douleurs dans les os, les articulations ou les muscles Faible taux de calcium  : douleurs ou crampes musculaires, confusion, fourmillements ou engourdissement dans les mains ou les pieds [place] de la mchoire : Location manager, gonflement ou rougeur dans la bouche, engourdissement de la mchoire, mauvaise cicatrisation aprs des soins dentaires, coulement inhabituel de la bouche, os visibles dans la bouche Effets secondaires ne ncessitant gnralement pas un avis mdical (signalez-les  votre quipe de soins s'ils persistent ou sont gnants) : Toux Diarrhe Fatigue Mal de tte Nauses Cette liste peut ne pas dcrire tous les effets secondaires  possibles. Appelez votre mdecin pour lui demander conseil au sujet des [employer]. Vous pouvez signaler les effets secondaires  la FDA, au [phone]. O dois-je conserver mon mdicament ? Ce mdicament est administr dans un tablissement [employer]. Il ne sera pas conserv  domicile. REMARQUE : cette notice est un rsum. Elle peut ne pas contenir toutes les informations possibles. Si vous avez des questions  propos de ce mdicament, Patent attorney mdecin, votre pharmacien ou votre fournisseur de [place].  2024 Elsevier/Gold Standard (2022-03-16 00:00:00)

## 2023-12-01 NOTE — Patient Instructions (Signed)

## 2023-12-01 NOTE — Progress Notes (Signed)
 Hematology and Oncology Follow Up Visit  Caitlin Greene [id number] 02-Jan-1966 58 y.o. 12/01/2023   Principle Diagnosis:  Metastatic breast cancer-ER positive, PR negative/HER-2 negative --bone metastasis only Iron  deficiency anemia Vitamin B12 deficiency  Current Therapy:   Faslodex  500 mg IM monthly --start on 04/2020 -next dose on 09/2023 Ibrance  100 mg p.o. daily (21d on/7d off) - start on 04/2020 Xgeva  120 mg subcu every 3 months -next dose 02/2024  IV iron -Venofer  given on 10/21/2022   Status post XRT for right sacral lesion -completed on 03/03/2023 -- 3000 rad Vitamin B12 1000 mcg IM monthly --start on 06/2023     Interim History:  Caitlin Greene is in for follow-up.  She really looks quite good.  She is doing her best to stay active.  She is trying to exercise.  I really give her a lot of credit.  Her last CA 27.29 was up a little bit at 75.  We are going to have to do another set of scans on her.  Will get a CT scan and a bone scan set up on her in August.  I think that if we do find that things are progressing, I might want to consider her for the new medication Datroway.  I do need to find out if she is  [id number] (+).  There is a new medication that we can use in addition to the Ibrance  and Faslodex .  It is called  Inavolisib.    She does not have any new pain.  She has noticed some pain in the hips.  This has been more chronic.  She has had no leg swelling.  She has had no bleeding.  She has had no change in bowel or bladder habits.  There has been no cough.  She has had no shortness of breath.  Her last iron  studies that we did back in June showed a ferritin of 168 with an iron  saturation of 32%.  Overall, I would have to say that her performance status is probably ECOG 0.       Wt Readings from Last 3 Encounters:  12/01/23 184 lb 12.8 oz (83.8 kg)  11/03/23 187 lb (84.8 kg)  10/04/23 189 lb (85.7 kg)    Medications:  Current Outpatient Medications:    acetaminophen   (TYLENOL ) 650 MG CR tablet, Take 650 mg by mouth every 8 (eight) hours as needed for pain., Disp: , Rfl:    calcium  carbonate (OS-CAL) 600 MG tablet, Take 1 tablet (600 mg total) by mouth 2 (two) times daily., Disp: 30 tablet, Rfl: 0   Ferrous Sulfate (IRON ) 325 (65 Fe) MG TABS, Take 1 tablet (325 mg total) by mouth daily at 2 PM., Disp: 30 tablet, Rfl: 0   FLUoxetine  (PROZAC ) 20 MG capsule, TAKE 3 CAPSULES(60 MG) BY MOUTH DAILY, Disp: 270 capsule, Rfl: 3   fulvestrant  (FASLODEX ) 250 MG/5ML injection, Inject 500 mg into the muscle every 30 (thirty) days. One injection each buttock over 1-2 minutes. Warm prior to use., Disp: , Rfl:    IBRANCE  100 MG tablet, TAKE 1 TABLET DAILY ON DAYS 1 THROUGH 21 OF CYCLE, 7 DAYS OFF. REPEAT, Disp: 21 tablet, Rfl: 0   lidocaine -prilocaine  (EMLA ) cream, Apply 1 Application topically as needed., Disp: 30 g, Rfl: 0   LORazepam  (ATIVAN ) 1 MG tablet, Take 1 tablet (1 mg total) by mouth once as needed for up to 1 dose for anxiety. Take 1 tablet (1 mg total) by mouth 20 minutes before MRI scan. Please have  a driver for this test as this medication has sedating effects., Disp: 1 tablet, Rfl: 0   oxyCODONE  (OXY IR/ROXICODONE ) 5 MG immediate release tablet, Take 1 tablet (5 mg total) by mouth every 8 (eight) hours as needed for severe pain., Disp: 50 tablet, Rfl: 0   pantoprazole  (PROTONIX ) 40 MG tablet, Take 1 tablet (40 mg total) by mouth 2 (two) times daily., Disp: 60 tablet, Rfl: 5   vitamin B-12 (CYANOCOBALAMIN ) 1000 MCG tablet, Take 1 tablet (1,000 mcg total) by mouth daily., Disp: 30 tablet, Rfl: 0   Wheat Dextrin (BENEFIBER DRINK MIX PO), Take by mouth daily at 6 (six) AM., Disp: , Rfl:  No current facility-administered medications for this visit.  Facility-Administered Medications Ordered in Other Visits:    denosumab  (XGEVA ) injection 120 mg, 120 mg, Subcutaneous, Once, Maeghan Canny, Maude SAUNDERS, MD  Allergies:  Allergies  Allergen Reactions   Amoxicillin-Pot Clavulanate  Hives and Itching   Iron  Anaphylaxis and Other (See Comments)    Patient develops hypotension, resp distress after infusion of feraheme .    Penicillins Hives, Itching and Other (See Comments)   Codeine Nausea And Vomiting    Needs pre-meds     Past Medical History, Surgical history, Social history, and Family History were reviewed and updated.  Review of Systems: Review of Systems  Constitutional:  Negative for appetite change.  HENT:  Negative.    Eyes: Negative.   Respiratory: Negative.    Cardiovascular: Negative.   Gastrointestinal:  Negative for abdominal pain and nausea.  Endocrine: Negative.   Genitourinary: Negative.    Musculoskeletal:  Positive for arthralgias and back pain.  Skin: Negative.   Neurological:  Negative for dizziness.  Hematological: Negative.   Psychiatric/Behavioral: Negative.      Physical Exam:  weight is 184 lb 12.8 oz (83.8 kg). Her oral temperature is 97.8 F (36.6 C). Her blood pressure is 110/76 and her pulse is 72. Her respiration is 17 and oxygen saturation is 100%.   Wt Readings from Last 3 Encounters:  12/01/23 184 lb 12.8 oz (83.8 kg)  11/03/23 187 lb (84.8 kg)  10/04/23 189 lb (85.7 kg)    Physical Exam Vitals reviewed.  HENT:     Head: Normocephalic and atraumatic.  Eyes:     Pupils: Pupils are equal, round, and reactive to light.  Cardiovascular:     Rate and Rhythm: Normal rate and regular rhythm.     Heart sounds: Normal heart sounds.  Pulmonary:     Effort: Pulmonary effort is normal.     Breath sounds: Normal breath sounds.  Abdominal:     General: Bowel sounds are normal.     Palpations: Abdomen is soft.  Musculoskeletal:        General: No tenderness or deformity. Normal range of motion.     Cervical back: Normal range of motion.  Lymphadenopathy:     Cervical: No cervical adenopathy.  Skin:    General: Skin is warm and dry.     Findings: No erythema or rash.  Neurological:     Mental Status: She is alert and  oriented to person, place, and time.  Psychiatric:        Behavior: Behavior normal.        Thought Content: Thought content normal.        Judgment: Judgment normal.      Lab Results  Component Value Date   WBC 1.9 (L) 12/01/2023   HGB 11.5 (L) 12/01/2023   HCT 32.9 (L) 12/01/2023  MCV 105.4 (H) 12/01/2023   PLT 123 (L) 12/01/2023     Chemistry      Component Value Date/Time   NA 140 12/01/2023 0916   K 4.2 12/01/2023 0916   CL 106 12/01/2023 0916   CO2 24 12/01/2023 0916   BUN 15 12/01/2023 0916   CREATININE 0.92 12/01/2023 0916   CREATININE 0.88 10/22/2019 1246      Component Value Date/Time   CALCIUM  8.9 12/01/2023 0916   ALKPHOS 45 12/01/2023 0916   AST 14 (L) 12/01/2023 0916   ALT 14 12/01/2023 0916   BILITOT 0.5 12/01/2023 0916      Impression and Plan: Caitlin Greene is a very charming 58 year old postmenopausal female with metastatic breast cancer.  We actually had seen her many years ago with a carcinoma in situ.  She has she has done incredibly well with her metastatic breast cancer.  We have only had her on antiestrogen therapy.  This has been going on for 4 years.  We will see about CT scan and bone scan.  Both of these really have helped us  out.  Again she is feeling well.  Hopefully, we will not need to make any change in protocol.  We will see with the CA 27.29 looks like.  We will plan to get her back here in another month.

## 2023-12-02 LAB — CANCER ANTIGEN 27.29: CA 27.29: 75.6 U/mL — ABNORMAL HIGH (ref 0.0–38.6)

## 2023-12-15 ENCOUNTER — Other Ambulatory Visit: Payer: Self-pay | Admitting: Hematology & Oncology

## 2023-12-15 ENCOUNTER — Ambulatory Visit (HOSPITAL_COMMUNITY)
Admission: RE | Admit: 2023-12-15 | Discharge: 2023-12-15 | Disposition: A | Discharge status: Home / Self Care | Source: Ambulatory Visit | Attending: Hematology & Oncology | Admitting: Hematology & Oncology

## 2023-12-15 ENCOUNTER — Other Ambulatory Visit: Payer: Self-pay

## 2023-12-15 ENCOUNTER — Other Ambulatory Visit (HOSPITAL_COMMUNITY): Payer: Self-pay

## 2023-12-15 ENCOUNTER — Encounter (HOSPITAL_COMMUNITY): Payer: Self-pay

## 2023-12-15 DIAGNOSIS — C7951 Secondary malignant neoplasm of bone: Secondary | ICD-10-CM

## 2023-12-15 DIAGNOSIS — K8020 Calculus of gallbladder without cholecystitis without obstruction: Secondary | ICD-10-CM | POA: Diagnosis not present

## 2023-12-15 DIAGNOSIS — C50919 Malignant neoplasm of unspecified site of unspecified female breast: Secondary | ICD-10-CM | POA: Insufficient documentation

## 2023-12-15 DIAGNOSIS — D3502 Benign neoplasm of left adrenal gland: Secondary | ICD-10-CM | POA: Diagnosis not present

## 2023-12-15 MED ORDER — IOHEXOL 300 MG/ML  SOLN
100.0000 mL | Freq: Once | INTRAMUSCULAR | Status: AC | PRN
  Administered 2023-12-15: 100 mL via INTRAVENOUS

## 2023-12-15 MED ORDER — IBRANCE 100 MG PO TABS
ORAL_TABLET | ORAL | 0 refills | Status: DC
  Filled 2023-12-15 – 2024-01-04 (×2): qty 21, 28d supply, fill #0

## 2023-12-15 MED ORDER — HEPARIN SOD (PORK) LOCK FLUSH 100 UNIT/ML IV SOLN
500.0000 [IU] | Freq: Once | INTRAVENOUS | Status: AC
  Administered 2023-12-15: 500 [IU] via INTRAVENOUS

## 2023-12-15 MED ORDER — HEPARIN SOD (PORK) LOCK FLUSH 100 UNIT/ML IV SOLN
INTRAVENOUS | Status: AC
  Filled 2023-12-15: qty 5

## 2023-12-15 MED ORDER — TECHNETIUM TC 99M MEDRONATE IV KIT
20.5000 | PACK | Freq: Once | INTRAVENOUS | Status: AC | PRN
  Administered 2023-12-15: 20.5 via INTRAVENOUS

## 2023-12-25 ENCOUNTER — Ambulatory Visit: Payer: Self-pay | Admitting: Hematology & Oncology

## 2023-12-25 NOTE — Telephone Encounter (Signed)
 Advised via MyChart.

## 2023-12-25 NOTE — Telephone Encounter (Signed)
-----   Message from Caitlin Greene sent at 12/25/2023  9:05 AM EDT ----- Please call and let her know that the bone scan looks very stable.  Nothing looks active in any new area.  Good job.. ----- Message ----- From: Rebecka, Rad Results In Sent: 12/23/2023   3:00 PM EDT To: Caitlin JONELLE Crease, MD

## 2023-12-29 ENCOUNTER — Other Ambulatory Visit: Payer: Self-pay

## 2024-01-01 ENCOUNTER — Inpatient Hospital Stay

## 2024-01-01 ENCOUNTER — Inpatient Hospital Stay (HOSPITAL_BASED_OUTPATIENT_CLINIC_OR_DEPARTMENT_OTHER): Admitting: Hematology & Oncology

## 2024-01-01 ENCOUNTER — Inpatient Hospital Stay: Attending: Hematology & Oncology

## 2024-01-01 ENCOUNTER — Encounter: Payer: Self-pay | Admitting: Hematology & Oncology

## 2024-01-01 VITALS — BP 104/75 | HR 81 | Temp 98.4°F | Resp 18 | Ht 68.0 in | Wt 184.0 lb

## 2024-01-01 DIAGNOSIS — C50919 Malignant neoplasm of unspecified site of unspecified female breast: Secondary | ICD-10-CM

## 2024-01-01 DIAGNOSIS — E538 Deficiency of other specified B group vitamins: Secondary | ICD-10-CM | POA: Insufficient documentation

## 2024-01-01 DIAGNOSIS — C7951 Secondary malignant neoplasm of bone: Secondary | ICD-10-CM | POA: Insufficient documentation

## 2024-01-01 DIAGNOSIS — Z170 Estrogen receptor positive status [ER+]: Secondary | ICD-10-CM | POA: Diagnosis not present

## 2024-01-01 DIAGNOSIS — D509 Iron deficiency anemia, unspecified: Secondary | ICD-10-CM | POA: Diagnosis not present

## 2024-01-01 DIAGNOSIS — Z1732 Human epidermal growth factor receptor 2 negative status: Secondary | ICD-10-CM | POA: Diagnosis not present

## 2024-01-01 DIAGNOSIS — D519 Vitamin B12 deficiency anemia, unspecified: Secondary | ICD-10-CM | POA: Diagnosis not present

## 2024-01-01 DIAGNOSIS — D500 Iron deficiency anemia secondary to blood loss (chronic): Secondary | ICD-10-CM

## 2024-01-01 DIAGNOSIS — Z79899 Other long term (current) drug therapy: Secondary | ICD-10-CM | POA: Diagnosis not present

## 2024-01-01 DIAGNOSIS — Z1722 Progesterone receptor negative status: Secondary | ICD-10-CM | POA: Diagnosis not present

## 2024-01-01 DIAGNOSIS — Z923 Personal history of irradiation: Secondary | ICD-10-CM | POA: Insufficient documentation

## 2024-01-01 DIAGNOSIS — Z79818 Long term (current) use of other agents affecting estrogen receptors and estrogen levels: Secondary | ICD-10-CM | POA: Diagnosis not present

## 2024-01-01 LAB — CBC WITH DIFFERENTIAL (CANCER CENTER ONLY)
Abs Immature Granulocytes: 0.13 K/uL — ABNORMAL HIGH (ref 0.00–0.07)
Basophils Absolute: 0 K/uL (ref 0.0–0.1)
Basophils Relative: 2 %
Eosinophils Absolute: 0 K/uL (ref 0.0–0.5)
Eosinophils Relative: 2 %
HCT: 31.8 % — ABNORMAL LOW (ref 36.0–46.0)
Hemoglobin: 11.2 g/dL — ABNORMAL LOW (ref 12.0–15.0)
Immature Granulocytes: 9 %
Lymphocytes Relative: 32 %
Lymphs Abs: 0.5 K/uL — ABNORMAL LOW (ref 0.7–4.0)
MCH: 37.3 pg — ABNORMAL HIGH (ref 26.0–34.0)
MCHC: 35.2 g/dL (ref 30.0–36.0)
MCV: 106 fL — ABNORMAL HIGH (ref 80.0–100.0)
Monocytes Absolute: 0.1 K/uL (ref 0.1–1.0)
Monocytes Relative: 10 %
Neutro Abs: 0.7 K/uL — ABNORMAL LOW (ref 1.7–7.7)
Neutrophils Relative %: 45 %
Platelet Count: 114 K/uL — ABNORMAL LOW (ref 150–400)
RBC: 3 MIL/uL — ABNORMAL LOW (ref 3.87–5.11)
RDW: 14.2 % (ref 11.5–15.5)
Smear Review: NORMAL
WBC Count: 1.4 K/uL — ABNORMAL LOW (ref 4.0–10.5)
nRBC: 0 % (ref 0.0–0.2)

## 2024-01-01 LAB — IRON AND IRON BINDING CAPACITY (CC-WL,HP ONLY)
Iron: 137 ug/dL (ref 28–170)
Saturation Ratios: 43 % — ABNORMAL HIGH (ref 10.4–31.8)
TIBC: 316 ug/dL (ref 250–450)
UIBC: 179 ug/dL

## 2024-01-01 LAB — CMP (CANCER CENTER ONLY)
ALT: 11 U/L (ref 0–44)
AST: 16 U/L (ref 15–41)
Albumin: 4.5 g/dL (ref 3.5–5.0)
Alkaline Phosphatase: 47 U/L (ref 38–126)
Anion gap: 11 (ref 5–15)
BUN: 13 mg/dL (ref 6–20)
CO2: 23 mmol/L (ref 22–32)
Calcium: 8.9 mg/dL (ref 8.9–10.3)
Chloride: 107 mmol/L (ref 98–111)
Creatinine: 0.87 mg/dL (ref 0.44–1.00)
GFR, Estimated: >60 mL/min (ref >60)
Glucose, Bld: 82 mg/dL (ref 70–99)
Potassium: 4.1 mmol/L (ref 3.5–5.1)
Sodium: 140 mmol/L (ref 135–145)
Total Bilirubin: 0.7 mg/dL (ref 0.0–1.2)
Total Protein: 6.8 g/dL (ref 6.5–8.1)

## 2024-01-01 LAB — FERRITIN: Ferritin: 143 ng/mL (ref 11–307)

## 2024-01-01 MED ORDER — FULVESTRANT 250 MG/5ML IM SOSY
500.0000 mg | PREFILLED_SYRINGE | INTRAMUSCULAR | Status: DC
  Administered 2024-01-01: 500 mg via INTRAMUSCULAR
  Filled 2024-01-01: qty 10

## 2024-01-01 MED ORDER — CYANOCOBALAMIN 1000 MCG/ML IJ SOLN
1000.0000 ug | Freq: Once | INTRAMUSCULAR | Status: AC
  Administered 2024-01-01: 1000 ug via INTRAMUSCULAR
  Filled 2024-01-01: qty 1

## 2024-01-01 NOTE — Progress Notes (Signed)
 Hematology and Oncology Follow Up Visit  Caitlin Greene [id number] August 25, 1965 58 y.o. 01/01/2024   Principle Diagnosis:  Metastatic breast cancer-ER positive, PR negative/HER-2 negative --bone metastasis only Iron  deficiency anemia Vitamin B12 deficiency  Current Therapy:   Faslodex  500 mg IM monthly --start on 04/2020 -next dose on 09/2023 Ibrance  100 mg p.o. daily (21d on/7d off) - start on 04/2020 Xgeva  120 mg subcu every 3 months -next dose 02/2024  IV iron -Venofer  given on 10/21/2022   Status post XRT for right sacral lesion -completed on 03/03/2023 -- 3000 rad Vitamin B12 1000 mcg IM monthly --start on 06/2023     Interim History:  Ms. Caitlin Greene is in for follow-up.  Her birthday is coming up.  This will be her big 58th birthday.  I know that there will be a lot of activities for her birthday.  Thankfully, it is usually over Labor Day weekend.  She did have a bone scan done and a CAT scan done.  The bone scan was done on 12/15/2023.  This basically showed stable disease without anything that was new or progressive.  She then had a CT scan that was done.  This also was done on 12/15/2023.  This again did not show any evidence of progressive disease.  So far, her disease still has been confined to her bones.  Her last CA 27.29 was stable at 75.  She does feel little tired.  Her iron  studies that we did back in July showed a ferritin of 69 with an iron  saturation of 30%.  It would be interesting to see what her iron  studies are at this time.  Her vitamin B12 level was 299 over last saw her in July.  Currently, her performance status is ECOG 1.  I think that if we do find that things are progressing, I might want to consider her for the new medication Datroway.  I do need to find out if she is  PIK3CA (+).  There is a new medication that we can use in addition to the Ibrance  and Faslodex .  It is called  Inavolisib.       Wt Readings from Last 3 Encounters:  01/01/24 184 lb (83.5 kg)   12/01/23 184 lb 12.8 oz (83.8 kg)  11/03/23 187 lb (84.8 kg)    Medications:  Current Outpatient Medications:    acetaminophen  (TYLENOL ) 650 MG CR tablet, Take 650 mg by mouth every 8 (eight) hours as needed for pain., Disp: , Rfl:    calcium  carbonate (OS-CAL) 600 MG tablet, Take 1 tablet (600 mg total) by mouth 2 (two) times daily., Disp: 30 tablet, Rfl: 0   Ferrous Sulfate (IRON ) 325 (65 Fe) MG TABS, Take 1 tablet (325 mg total) by mouth daily at 2 PM., Disp: 30 tablet, Rfl: 0   FLUoxetine  (PROZAC ) 20 MG capsule, TAKE 3 CAPSULES(60 MG) BY MOUTH DAILY, Disp: 270 capsule, Rfl: 3   fulvestrant  (FASLODEX ) 250 MG/5ML injection, Inject 500 mg into the muscle every 30 (thirty) days. One injection each buttock over 1-2 minutes. Warm prior to use., Disp: , Rfl:    IBRANCE  100 MG tablet, TAKE 1 TABLET DAILY ON DAYS 1 THROUGH 21 OF CYCLE, 7 DAYS OFF. REPEAT, Disp: 21 tablet, Rfl: 0   lidocaine -prilocaine  (EMLA ) cream, Apply 1 Application topically as needed., Disp: 30 g, Rfl: 0   LORazepam  (ATIVAN ) 1 MG tablet, Take 1 tablet (1 mg total) by mouth once as needed for up to 1 dose for anxiety. Take 1 tablet (1  mg total) by mouth 20 minutes before MRI scan. Please have a driver for this test as this medication has sedating effects., Disp: 1 tablet, Rfl: 0   oxyCODONE  (OXY IR/ROXICODONE ) 5 MG immediate release tablet, Take 1 tablet (5 mg total) by mouth every 8 (eight) hours as needed for severe pain., Disp: 50 tablet, Rfl: 0   pantoprazole  (PROTONIX ) 40 MG tablet, Take 1 tablet (40 mg total) by mouth 2 (two) times daily., Disp: 60 tablet, Rfl: 5   vitamin B-12 (CYANOCOBALAMIN ) 1000 MCG tablet, Take 1 tablet (1,000 mcg total) by mouth daily., Disp: 30 tablet, Rfl: 0   Wheat Dextrin (BENEFIBER DRINK MIX PO), Take by mouth daily at 6 (six) AM., Disp: , Rfl:  No current facility-administered medications for this visit.  Facility-Administered Medications Ordered in Other Visits:    cyanocobalamin  (VITAMIN B12)  injection 1,000 mcg, 1,000 mcg, Intramuscular, Once, Ryett Hamman, Maude SAUNDERS, MD   fulvestrant  (FASLODEX ) injection 500 mg, 500 mg, Intramuscular, Q30 days, Lisette Mancebo, Maude SAUNDERS, MD  Allergies:  Allergies  Allergen Reactions   Amoxicillin-Pot Clavulanate Hives and Itching   Iron  Anaphylaxis and Other (See Comments)    Patient develops hypotension, resp distress after infusion of feraheme .    Penicillins Hives, Itching and Other (See Comments)   Codeine Nausea And Vomiting    Needs pre-meds     Past Medical History, Surgical history, Social history, and Family History were reviewed and updated.  Review of Systems: Review of Systems  Constitutional:  Negative for appetite change.  HENT:  Negative.    Eyes: Negative.   Respiratory: Negative.    Cardiovascular: Negative.   Gastrointestinal:  Negative for abdominal pain and nausea.  Endocrine: Negative.   Genitourinary: Negative.    Musculoskeletal:  Positive for arthralgias and back pain.  Skin: Negative.   Neurological:  Negative for dizziness.  Hematological: Negative.   Psychiatric/Behavioral: Negative.      Physical Exam:  height is 5' 8 (1.727 m) and weight is 184 lb (83.5 kg). Her oral temperature is 98.4 F (36.9 C). Her blood pressure is 104/75 and her pulse is 81. Her respiration is 18 and oxygen saturation is 97%.   Wt Readings from Last 3 Encounters:  01/01/24 184 lb (83.5 kg)  12/01/23 184 lb 12.8 oz (83.8 kg)  11/03/23 187 lb (84.8 kg)    Physical Exam Vitals reviewed.  HENT:     Head: Normocephalic and atraumatic.  Eyes:     Pupils: Pupils are equal, round, and reactive to light.  Cardiovascular:     Rate and Rhythm: Normal rate and regular rhythm.     Heart sounds: Normal heart sounds.  Pulmonary:     Effort: Pulmonary effort is normal.     Breath sounds: Normal breath sounds.  Abdominal:     General: Bowel sounds are normal.     Palpations: Abdomen is soft.  Musculoskeletal:        General: No tenderness  or deformity. Normal range of motion.     Cervical back: Normal range of motion.  Lymphadenopathy:     Cervical: No cervical adenopathy.  Skin:    General: Skin is warm and dry.     Findings: No erythema or rash.  Neurological:     Mental Status: She is alert and oriented to person, place, and time.  Psychiatric:        Behavior: Behavior normal.        Thought Content: Thought content normal.  Judgment: Judgment normal.      Lab Results  Component Value Date   WBC 1.4 (L) 01/01/2024   HGB 11.2 (L) 01/01/2024   HCT 31.8 (L) 01/01/2024   MCV 106.0 (H) 01/01/2024   PLT PENDING 01/01/2024     Chemistry      Component Value Date/Time   NA 140 01/01/2024 1203   K 4.1 01/01/2024 1203   CL 107 01/01/2024 1203   CO2 23 01/01/2024 1203   BUN 13 01/01/2024 1203   CREATININE 0.87 01/01/2024 1203   CREATININE 0.88 10/22/2019 1246      Component Value Date/Time   CALCIUM  8.9 01/01/2024 1203   ALKPHOS 47 01/01/2024 1203   AST 16 01/01/2024 1203   ALT 11 01/01/2024 1203   BILITOT 0.7 01/01/2024 1203      Impression and Plan: Ms. Fulmore is a very charming 58 year old postmenopausal female with metastatic breast cancer.  We actually had seen her many years ago with a carcinoma in situ.  She has she has done incredibly well with her metastatic breast cancer.  We have only had her on antiestrogen therapy.  This has been going on for 4 years.  I am so glad that she is going to have her 58th birthday this weekend.  She really deserves have a wonderful birthday.  I know her family will celebrate her.  As always, we will get her back in another month.

## 2024-01-01 NOTE — Patient Instructions (Signed)

## 2024-01-01 NOTE — Patient Instructions (Signed)
 Vitamin B12 Injection What is this medication? Vitamin B12 (VAHY tuh min B12) prevents and treats low vitamin B12 levels in your body. It is used in people who do not get enough vitamin B12 from their diet or when their digestive tract does not absorb enough. Vitamin B12 plays an important role in maintaining the health of your nervous system and red blood cells. This medicine may be used for other purposes; ask your health care provider or pharmacist if you have questions. COMMON BRAND NAME(S): B-12 Compliance Kit, B-12 Injection Kit, Cyomin, Dodex , LA-12, Nutri-Twelve, Physicians EZ Use B-12, Primabalt, Vitamin Deficiency Injectable System - B12 What should I tell my care team before I take this medication? They need to know if you have any of these conditions: Kidney disease Leber's disease Megaloblastic anemia An unusual or allergic reaction to cyanocobalamin , cobalt, other medications, foods, dyes, or preservatives Pregnant or trying to get pregnant Breast-feeding How should I use this medication? This medication is injected into a muscle or deeply under the skin. It is usually given in a clinic or care team's office. However, your care team may teach you how to inject yourself. Follow all instructions. Talk to your care team about the use of this medication in children. Special care may be needed. Overdosage: If you think you have taken too much of this medicine contact a poison control center or emergency room at once. NOTE: This medicine is only for you. Do not share this medicine with others. What if I miss a dose? If you are given your dose at a clinic or care team's office, call to reschedule your appointment. If you give your own injections, and you miss a dose, take it as soon as you can. If it is almost time for your next dose, take only that dose. Do not take double or extra doses. What may interact with this medication? Alcohol Colchicine This list may not describe all possible  interactions. Give your health care provider a list of all the medicines, herbs, non-prescription drugs, or dietary supplements you use. Also tell them if you smoke, drink alcohol, or use illegal drugs. Some items may interact with your medicine. What should I watch for while using this medication? Visit your care team regularly. You may need blood work done while you are taking this medication. You may need to follow a special diet. Talk to your care team. Limit your alcohol intake and avoid smoking to get the best benefit. What side effects may I notice from receiving this medication? Side effects that you should report to your care team as soon as possible: Allergic reactions--skin rash, itching, hives, swelling of the face, lips, tongue, or throat Swelling of the ankles, hands, or feet Trouble breathing Side effects that usually do not require medical attention (report to your care team if they continue or are bothersome): Diarrhea This list may not describe all possible side effects. Call your doctor for medical advice about side effects. You may report side effects to FDA at 1-800-FDA-1088. Where should I keep my medication? Keep out of the reach of children. Store at room temperature between 15 and 30 degrees C (59 and 85 degrees F). Protect from light. Throw away any unused medication after the expiration date. NOTE: This sheet is a summary. It may not cover all possible information. If you have questions about this medicine, talk to your doctor, pharmacist, or health care provider.  2024 Elsevier/Gold Standard (2021-01-05 00:00:00)Fulvestrant  Injection What is this medication? FULVESTRANT  (ful VES  trant) treats breast cancer. It works by blocking the hormone estrogen in breast tissue, which prevents breast cancer cells from spreading or growing. This medicine may be used for other purposes; ask your health care provider or pharmacist if you have questions. COMMON BRAND NAME(S):  FASLODEX  What should I tell my care team before I take this medication? They need to know if you have any of these conditions: Bleeding disorder Liver disease Low blood cell levels (white cells, red cells, and platelets) An unusual or allergic reaction to fulvestrant , other medications, foods, dyes, or preservatives Pregnant or trying to get pregnant Breastfeeding How should I use this medication? This medication is injected into a muscle. It is given by your care team in a hospital or clinic setting. Talk to your care team about the use of this medication in children. Special care may be needed. Overdosage: If you think you have taken too much of this medicine contact a poison control center or emergency room at once. NOTE: This medicine is only for you. Do not share this medicine with others. What if I miss a dose? Keep appointments for follow-up doses. It is important not to miss your dose. Call your care team if you are unable to keep an appointment. What may interact with this medication? Fluoroestradiol F18 This list may not describe all possible interactions. Give your health care provider a list of all the medicines, herbs, non-prescription drugs, or dietary supplements you use. Also tell them if you smoke, drink alcohol, or use illegal drugs. Some items may interact with your medicine. What should I watch for while using this medication? Your condition will be monitored carefully while you are receiving this medication. You may need blood work done while you are taking this medication. This medication is injected into a muscle. Talk to your care team if you also take medications that prevent or treat blood clots, such as warfarin. Blood thinners may increase the risk of bleeding or bruising in the muscle where this medication is injected. The benefits of this medication may outweigh the risks. Your care team can help you find the option that works for you. They can also help limit the  risk of bleeding. Talk to your care team if you may be pregnant. Serious birth defects can occur if you take this medication during pregnancy and for 1 year after the last dose. You will need a negative pregnancy test before starting this medication. Contraception is recommended while taking this medication and for 1 year after the last dose. Your care team can help you find the option that works for you. Do not breastfeed while taking this medication and for 1 year after the last dose. This medication may cause infertility. Talk to your care team if you are concerned about your fertility. What side effects may I notice from receiving this medication? Side effects that you should report to your care team as soon as possible: Allergic reactions or angioedema--skin rash, itching or hives, swelling of the face, eyes, lips, tongue, arms, or legs, trouble swallowing or breathing Pain, tingling, or numbness in the hands or feet Side effects that usually do not require medical attention (report to your care team if they continue or are bothersome): Bone, joint, or muscle pain Constipation Headache Hot flashes Nausea Pain, redness, or irritation at injection site Unusual weakness or fatigue This list may not describe all possible side effects. Call your doctor for medical advice about side effects. You may report side effects to FDA  at 1-800-FDA-1088. Where should I keep my medication? This medication is given in a hospital or clinic. It will not be stored at home. NOTE: This sheet is a summary. It may not cover all possible information. If you have questions about this medicine, talk to your doctor, pharmacist, or health care provider.  2024 Elsevier/Gold Standard (2022-12-30 00:00:00)

## 2024-01-02 LAB — CANCER ANTIGEN 27.29: CA 27.29: 84.2 U/mL — ABNORMAL HIGH (ref 0.0–38.6)

## 2024-01-04 ENCOUNTER — Other Ambulatory Visit: Payer: Self-pay

## 2024-01-04 ENCOUNTER — Other Ambulatory Visit: Payer: Self-pay | Admitting: Pharmacy Technician

## 2024-01-04 NOTE — Progress Notes (Signed)
 Specialty Pharmacy Refill Coordination Note  Caitlin Greene is a 58 y.o. female contacted today regarding refills of specialty medication(s) Palbociclib  (Ibrance )   Patient requested Delivery   Delivery date: 01/11/24   Verified address: [address]  [place] [address]   Medication will be filled on 01/10/24.  Patient break starts on 9/11/07/23 & resume meds on 01/15/24.

## 2024-01-29 ENCOUNTER — Inpatient Hospital Stay: Attending: Hematology & Oncology

## 2024-01-29 ENCOUNTER — Inpatient Hospital Stay (HOSPITAL_BASED_OUTPATIENT_CLINIC_OR_DEPARTMENT_OTHER): Admitting: Medical Oncology

## 2024-01-29 ENCOUNTER — Encounter: Payer: Self-pay | Admitting: Medical Oncology

## 2024-01-29 ENCOUNTER — Inpatient Hospital Stay

## 2024-01-29 ENCOUNTER — Ambulatory Visit: Payer: Self-pay | Admitting: Hematology & Oncology

## 2024-01-29 VITALS — BP 107/81 | HR 84 | Temp 98.4°F | Resp 18 | Ht 68.0 in | Wt 186.8 lb

## 2024-01-29 DIAGNOSIS — D510 Vitamin B12 deficiency anemia due to intrinsic factor deficiency: Secondary | ICD-10-CM

## 2024-01-29 DIAGNOSIS — C50012 Malignant neoplasm of nipple and areola, left female breast: Secondary | ICD-10-CM | POA: Diagnosis not present

## 2024-01-29 DIAGNOSIS — D519 Vitamin B12 deficiency anemia, unspecified: Secondary | ICD-10-CM | POA: Diagnosis not present

## 2024-01-29 DIAGNOSIS — Z1722 Progesterone receptor negative status: Secondary | ICD-10-CM | POA: Diagnosis not present

## 2024-01-29 DIAGNOSIS — Z95828 Presence of other vascular implants and grafts: Secondary | ICD-10-CM | POA: Diagnosis not present

## 2024-01-29 DIAGNOSIS — C7951 Secondary malignant neoplasm of bone: Secondary | ICD-10-CM

## 2024-01-29 DIAGNOSIS — C50011 Malignant neoplasm of nipple and areola, right female breast: Secondary | ICD-10-CM

## 2024-01-29 DIAGNOSIS — D500 Iron deficiency anemia secondary to blood loss (chronic): Secondary | ICD-10-CM

## 2024-01-29 DIAGNOSIS — K922 Gastrointestinal hemorrhage, unspecified: Secondary | ICD-10-CM | POA: Insufficient documentation

## 2024-01-29 DIAGNOSIS — C50919 Malignant neoplasm of unspecified site of unspecified female breast: Secondary | ICD-10-CM

## 2024-01-29 DIAGNOSIS — Z170 Estrogen receptor positive status [ER+]: Secondary | ICD-10-CM | POA: Diagnosis not present

## 2024-01-29 DIAGNOSIS — Z1732 Human epidermal growth factor receptor 2 negative status: Secondary | ICD-10-CM | POA: Diagnosis not present

## 2024-01-29 DIAGNOSIS — D61818 Other pancytopenia: Secondary | ICD-10-CM | POA: Insufficient documentation

## 2024-01-29 DIAGNOSIS — Z5111 Encounter for antineoplastic chemotherapy: Secondary | ICD-10-CM | POA: Diagnosis present

## 2024-01-29 DIAGNOSIS — Z923 Personal history of irradiation: Secondary | ICD-10-CM | POA: Diagnosis not present

## 2024-01-29 LAB — CBC WITH DIFFERENTIAL (CANCER CENTER ONLY)
Abs Immature Granulocytes: 0.01 K/uL (ref 0.00–0.07)
Basophils Absolute: 0 K/uL (ref 0.0–0.1)
Basophils Relative: 2 %
Eosinophils Absolute: 0.1 K/uL (ref 0.0–0.5)
Eosinophils Relative: 3 %
HCT: 31.2 % — ABNORMAL LOW (ref 36.0–46.0)
Hemoglobin: 11 g/dL — ABNORMAL LOW (ref 12.0–15.0)
Immature Granulocytes: 1 %
Lymphocytes Relative: 31 %
Lymphs Abs: 0.5 K/uL — ABNORMAL LOW (ref 0.7–4.0)
MCH: 37 pg — ABNORMAL HIGH (ref 26.0–34.0)
MCHC: 35.3 g/dL (ref 30.0–36.0)
MCV: 105.1 fL — ABNORMAL HIGH (ref 80.0–100.0)
Monocytes Absolute: 0.2 K/uL (ref 0.1–1.0)
Monocytes Relative: 12 %
Neutro Abs: 0.9 K/uL — ABNORMAL LOW (ref 1.7–7.7)
Neutrophils Relative %: 51 %
Platelet Count: 129 K/uL — ABNORMAL LOW (ref 150–400)
RBC: 2.97 MIL/uL — ABNORMAL LOW (ref 3.87–5.11)
RDW: 14.2 % (ref 11.5–15.5)
Smear Review: NORMAL
WBC Count: 1.7 K/uL — ABNORMAL LOW (ref 4.0–10.5)
nRBC: 0 % (ref 0.0–0.2)

## 2024-01-29 LAB — CMP (CANCER CENTER ONLY)
ALT: 10 U/L (ref 0–44)
AST: 15 U/L (ref 15–41)
Albumin: 4.3 g/dL (ref 3.5–5.0)
Alkaline Phosphatase: 53 U/L (ref 38–126)
Anion gap: 9 (ref 5–15)
BUN: 14 mg/dL (ref 6–20)
CO2: 25 mmol/L (ref 22–32)
Calcium: 8.9 mg/dL (ref 8.9–10.3)
Chloride: 107 mmol/L (ref 98–111)
Creatinine: 0.84 mg/dL (ref 0.44–1.00)
GFR, Estimated: >60 mL/min (ref >60)
Glucose, Bld: 89 mg/dL (ref 70–99)
Potassium: 4.1 mmol/L (ref 3.5–5.1)
Sodium: 140 mmol/L (ref 135–145)
Total Bilirubin: 0.5 mg/dL (ref 0.0–1.2)
Total Protein: 6.4 g/dL — ABNORMAL LOW (ref 6.5–8.1)

## 2024-01-29 LAB — IRON AND IRON BINDING CAPACITY (CC-WL,HP ONLY)
Iron: 61 ug/dL (ref 28–170)
Saturation Ratios: 20 % (ref 10.4–31.8)
TIBC: 304 ug/dL (ref 250–450)
UIBC: 243 ug/dL

## 2024-01-29 LAB — FERRITIN: Ferritin: 69 ng/mL (ref 11–307)

## 2024-01-29 LAB — RETICULOCYTES
Immature Retic Fract: 21.4 % — ABNORMAL HIGH (ref 2.3–15.9)
RBC.: 2.98 MIL/uL — ABNORMAL LOW (ref 3.87–5.11)
Retic Count, Absolute: 43.2 K/uL (ref 19.0–186.0)
Retic Ct Pct: 1.5 % (ref 0.4–3.1)

## 2024-01-29 LAB — VITAMIN B12: Vitamin B-12: 553 pg/mL (ref 180–914)

## 2024-01-29 MED ORDER — CYANOCOBALAMIN 1000 MCG/ML IJ SOLN
1000.0000 ug | Freq: Once | INTRAMUSCULAR | Status: AC
  Administered 2024-01-29: 1000 ug via INTRAMUSCULAR
  Filled 2024-01-29: qty 1

## 2024-01-29 MED ORDER — FULVESTRANT 250 MG/5ML IM SOSY
500.0000 mg | PREFILLED_SYRINGE | INTRAMUSCULAR | Status: DC
  Administered 2024-01-29: 500 mg via INTRAMUSCULAR
  Filled 2024-01-29: qty 10

## 2024-01-29 NOTE — Patient Instructions (Signed)

## 2024-01-29 NOTE — Progress Notes (Signed)
 Hematology and Oncology Follow Up Visit  Caitlin Greene [id number] 07-25-1965 58 y.o. 01/29/2024   Principle Diagnosis:  Metastatic breast cancer-ER positive, PR negative/HER-2 negative --bone metastasis only Iron  deficiency anemia Vitamin B12 deficiency  Current Therapy:   Faslodex  500 mg IM monthly --start on 04/2020 -next dose on 09/2023 Ibrance  100 mg p.o. daily (21d on/7d off) - start on 04/2020 Xgeva  120 mg subcu every 3 months -next dose 02/2024  IV iron -Venofer  given on 10/21/2022   Status post XRT for right sacral lesion -completed on 03/03/2023 -- 3000 rad Vitamin B12 1000 mcg IM monthly --start on 06/2023     Interim History:  Ms. Caitlin Greene is in for follow-up.  CT from 12/15/2023 showed stable disease without anything that was new or progressive.    So far, her disease still has been confined to her bones.  Her last CA 27.29 bumped up slightly from 75.6 to 84.2  She does feel little tired.  Her iron  studies that we did back in August showed a ferritin of 143 with an iron  saturation of 43%.  It would be interesting to see what her iron  studies are at this time.  Her vitamin B12 level was 299 in July   Currently, her performance status is ECOG 1.  Per Dr. Timmy, if we do find that things are progressing, he would consider her for the new medication Datroway.  First testing for PIK3CA (+) mutation suggested.  He also mentioned Inavolisib, a new medication that we can use in addition to the Ibrance  and Faslodex .     Wt Readings from Last 3 Encounters:  01/29/24 186 lb 12.8 oz (84.7 kg)  01/01/24 184 lb (83.5 kg)  12/01/23 184 lb 12.8 oz (83.8 kg)    Medications:  Current Outpatient Medications:    acetaminophen  (TYLENOL ) 650 MG CR tablet, Take 650 mg by mouth every 8 (eight) hours as needed for pain., Disp: , Rfl:    calcium  carbonate (OS-CAL) 600 MG tablet, Take 1 tablet (600 mg total) by mouth 2 (two) times daily., Disp: 30 tablet, Rfl: 0   Ferrous Sulfate (IRON ) 325  (65 Fe) MG TABS, Take 1 tablet (325 mg total) by mouth daily at 2 PM., Disp: 30 tablet, Rfl: 0   FLUoxetine  (PROZAC ) 20 MG capsule, TAKE 3 CAPSULES(60 MG) BY MOUTH DAILY, Disp: 270 capsule, Rfl: 3   fulvestrant  (FASLODEX ) 250 MG/5ML injection, Inject 500 mg into the muscle every 30 (thirty) days. One injection each buttock over 1-2 minutes. Warm prior to use., Disp: , Rfl:    IBRANCE  100 MG tablet, TAKE 1 TABLET DAILY ON DAYS 1 THROUGH 21 OF CYCLE, 7 DAYS OFF. REPEAT, Disp: 21 tablet, Rfl: 0   lidocaine -prilocaine  (EMLA ) cream, Apply 1 Application topically as needed., Disp: 30 g, Rfl: 0   LORazepam  (ATIVAN ) 1 MG tablet, Take 1 tablet (1 mg total) by mouth once as needed for up to 1 dose for anxiety. Take 1 tablet (1 mg total) by mouth 20 minutes before MRI scan. Please have a driver for this test as this medication has sedating effects., Disp: 1 tablet, Rfl: 0   oxyCODONE  (OXY IR/ROXICODONE ) 5 MG immediate release tablet, Take 1 tablet (5 mg total) by mouth every 8 (eight) hours as needed for severe pain., Disp: 50 tablet, Rfl: 0   pantoprazole  (PROTONIX ) 40 MG tablet, Take 1 tablet (40 mg total) by mouth 2 (two) times daily., Disp: 60 tablet, Rfl: 5   vitamin B-12 (CYANOCOBALAMIN ) 1000 MCG tablet, Take 1 tablet (  1,000 mcg total) by mouth daily., Disp: 30 tablet, Rfl: 0   Wheat Dextrin (BENEFIBER DRINK MIX PO), Take by mouth daily at 6 (six) AM., Disp: , Rfl:  No current facility-administered medications for this visit.  Facility-Administered Medications Ordered in Other Visits:    fulvestrant  (FASLODEX ) injection 500 mg, 500 mg, Intramuscular, Q30 days, Timmy Maude SAUNDERS, MD, 500 mg at 01/29/24 1147  Allergies:  Allergies  Allergen Reactions   Amoxicillin-Pot Clavulanate Hives and Itching   Iron  Anaphylaxis and Other (See Comments)    Patient develops hypotension, resp distress after infusion of feraheme .    Penicillins Hives, Itching and Other (See Comments)   Codeine Nausea And Vomiting     Needs pre-meds     Past Medical History, Surgical history, Social history, and Family History were reviewed and updated.  Review of Systems: Review of Systems  Constitutional:  Negative for appetite change.  HENT:  Negative.    Eyes: Negative.   Respiratory: Negative.    Cardiovascular: Negative.   Gastrointestinal:  Negative for abdominal pain and nausea.  Endocrine: Negative.   Genitourinary: Negative.    Musculoskeletal:  Positive for arthralgias and back pain.  Skin: Negative.   Neurological:  Negative for dizziness.  Hematological: Negative.   Psychiatric/Behavioral: Negative.      Physical Exam:  height is 5' 8 (1.727 m) and weight is 186 lb 12.8 oz (84.7 kg). Her oral temperature is 98.4 F (36.9 C). Her blood pressure is 107/81 and her pulse is 84. Her respiration is 18 and oxygen saturation is 98%.   Wt Readings from Last 3 Encounters:  01/29/24 186 lb 12.8 oz (84.7 kg)  01/01/24 184 lb (83.5 kg)  12/01/23 184 lb 12.8 oz (83.8 kg)    Physical Exam Vitals reviewed.  HENT:     Head: Normocephalic and atraumatic.  Eyes:     Pupils: Pupils are equal, round, and reactive to light.  Cardiovascular:     Rate and Rhythm: Normal rate and regular rhythm.     Heart sounds: Normal heart sounds.  Pulmonary:     Effort: Pulmonary effort is normal.     Breath sounds: Normal breath sounds.  Abdominal:     General: Bowel sounds are normal.     Palpations: Abdomen is soft.  Musculoskeletal:        General: No tenderness or deformity. Normal range of motion.     Cervical back: Normal range of motion.  Lymphadenopathy:     Cervical: No cervical adenopathy.  Skin:    General: Skin is warm and dry.     Findings: No erythema or rash.  Neurological:     Mental Status: She is alert and oriented to person, place, and time.  Psychiatric:        Behavior: Behavior normal.        Thought Content: Thought content normal.        Judgment: Judgment normal.     Lab Results   Component Value Date   WBC 1.7 (L) 01/29/2024   HGB 11.0 (L) 01/29/2024   HCT 31.2 (L) 01/29/2024   MCV 105.1 (H) 01/29/2024   PLT 129 (L) 01/29/2024     Chemistry      Component Value Date/Time   NA 140 01/29/2024 1052   K 4.1 01/29/2024 1052   CL 107 01/29/2024 1052   CO2 25 01/29/2024 1052   BUN 14 01/29/2024 1052   CREATININE 0.84 01/29/2024 1052   CREATININE 0.88 10/22/2019 1246  Component Value Date/Time   CALCIUM  8.9 01/29/2024 1052   ALKPHOS 53 01/29/2024 1052   AST 15 01/29/2024 1052   ALT 10 01/29/2024 1052   BILITOT 0.5 01/29/2024 1052     Encounter Diagnoses  Name Primary?   Metastatic cancer to bone (HCC)    Anemia due to vitamin B12 deficiency, unspecified B12 deficiency type    Port-A-Cath in place    Vitamin B12 deficiency anemia due to intrinsic factor deficiency    Gastrointestinal hemorrhage, unspecified gastrointestinal hemorrhage type    Iron  deficiency anemia due to chronic blood loss    Malignant neoplasm of nipple of both breasts in female, estrogen receptor positive (HCC) Yes    Impression and Plan: Ms. Carriveau is a very charming 58 year old postmenopausal female with metastatic breast cancer. She is on antiestrogen therapy. She also has pancytopenia felt to be secondary to her Ibrance .   She has she has done incredibly well with her metastatic breast cancer.  We have only had her on antiestrogen therapy.  This has been going on for 4 years.  Today CBC shows a WBC of 1.7 with ANC of 0.9(Grade 3). Hgb 11 which is stable. Platelets are stable at 129. She may benefit from a dose reduction however labs are essentially stable and CA 27.29 level is up a bit. Will discuss with Dr. Ennever   Day 1 of cycle: Withhold IBRANCE , repeat complete blood count monitoring within  1 week. When recovered to Grade <=2, start the next cycle at the same  dose. Day 15 of first 2 cycles: If Grade 3 on Day 15, continue IBRANCE  at current dose to complete  cycle  and repeat complete blood count on Day 22. If Grade 4 on Day 22, see Grade 4 dose modification guidelines below. Consider dose reduction in cases of prolonged (>1 week) recovery from  Grade 3 neutropenia or recurrent Grade 3 neutropenia on Day 1 of subsequent cycles. CMP Normal aside from a slightly low protein level of 6.4 CA 27.29 pending- if elevated again by at least 10 points I would suggest re-imaging next month- if progression is seen, a liquid biopsy would be recommended.   *After discussion with Dr. Ennever we are electing to proceed forward with treatment at current dose given her rising CA 27.29 levels and generalized stability of labs. We did agree that if her CA 27.29 level is elevated that we will proceed forward with a liquid biopsy in preparation for change of treatment.    RTC 1 month MD, [place], [place]

## 2024-01-29 NOTE — Patient Instructions (Signed)
 Fulvestrant Injection What is this medication? FULVESTRANT (ful VES trant) treats breast cancer. It works by blocking the hormone estrogen in breast tissue, which prevents breast cancer cells from spreading or growing. This medicine may be used for other purposes; ask your health care provider or pharmacist if you have questions. COMMON BRAND NAME(S): FASLODEX What should I tell my care team before I take this medication? They need to know if you have any of these conditions: Bleeding disorder Liver disease Low blood cell levels (white cells, red cells, and platelets) An unusual or allergic reaction to fulvestrant, other medications, foods, dyes, or preservatives Pregnant or trying to get pregnant Breastfeeding How should I use this medication? This medication is injected into a muscle. It is given by your care team in a hospital or clinic setting. Talk to your care team about the use of this medication in children. Special care may be needed. Overdosage: If you think you have taken too much of this medicine contact a poison control center or emergency room at once. NOTE: This medicine is only for you. Do not share this medicine with others. What if I miss a dose? Keep appointments for follow-up doses. It is important not to miss your dose. Call your care team if you are unable to keep an appointment. What may interact with this medication? Fluoroestradiol F18 This list may not describe all possible interactions. Give your health care provider a list of all the medicines, herbs, non-prescription drugs, or dietary supplements you use. Also tell them if you smoke, drink alcohol, or use illegal drugs. Some items may interact with your medicine. What should I watch for while using this medication? Your condition will be monitored carefully while you are receiving this medication. You may need blood work done while you are taking this medication. This medication is injected into a muscle. Talk  to your care team if you also take medications that prevent or treat blood clots, such as warfarin. Blood thinners may increase the risk of bleeding or bruising in the muscle where this medication is injected. The benefits of this medication may outweigh the risks. Your care team can help you find the option that works for you. They can also help limit the risk of bleeding. Talk to your care team if you may be pregnant. Serious birth defects can occur if you take this medication during pregnancy and for 1 year after the last dose. You will need a negative pregnancy test before starting this medication. Contraception is recommended while taking this medication and for 1 year after the last dose. Your care team can help you find the option that works for you. Do not breastfeed while taking this medication and for 1 year after the last dose. This medication may cause infertility. Talk to your care team if you are concerned about your fertility. What side effects may I notice from receiving this medication? Side effects that you should report to your care team as soon as possible: Allergic reactions or angioedema--skin rash, itching or hives, swelling of the face, eyes, lips, tongue, arms, or legs, trouble swallowing or breathing Pain, tingling, or numbness in the hands or feet Side effects that usually do not require medical attention (report to your care team if they continue or are bothersome): Bone, joint, or muscle pain Constipation Headache Hot flashes Nausea Pain, redness, or irritation at injection site Unusual weakness or fatigue This list may not describe all possible side effects. Call your doctor for medical advice about side  effects. You may report side effects to FDA at 1-800-FDA-1088. Where should I keep my medication? This medication is given in a hospital or clinic. It will not be stored at home. NOTE: This sheet is a summary. It may not cover all possible information. If you have  questions about this medicine, talk to your doctor, pharmacist, or health care provider.  2024 Elsevier/Gold Standard (2022-12-30 00:00:00)

## 2024-01-30 ENCOUNTER — Encounter: Payer: Self-pay | Admitting: Hematology & Oncology

## 2024-01-30 ENCOUNTER — Encounter: Payer: Self-pay | Admitting: *Deleted

## 2024-01-30 LAB — CANCER ANTIGEN 27.29: CA 27.29: 76 U/mL — ABNORMAL HIGH (ref 0.0–38.6)

## 2024-01-30 NOTE — Telephone Encounter (Signed)
-----   Message from Maude JONELLE Crease sent at 01/30/2024 11:55 AM EDT ----- Please call and let her know that the tumor markers is relatively stable.  It is now 60.  Jeralyn ----- Message ----- From: Rebecka, [facility] Sent: 01/29/2024  10:59 AM EDT To: Maude JONELLE Crease, MD

## 2024-01-30 NOTE — Telephone Encounter (Signed)
 Advised via MyChart.

## 2024-01-31 ENCOUNTER — Other Ambulatory Visit: Payer: Self-pay

## 2024-01-31 ENCOUNTER — Other Ambulatory Visit: Payer: Self-pay | Admitting: Hematology & Oncology

## 2024-01-31 MED ORDER — IBRANCE 100 MG PO TABS
ORAL_TABLET | ORAL | 0 refills | Status: DC
  Filled 2024-01-31: qty 21, 28d supply, fill #0

## 2024-01-31 NOTE — Progress Notes (Signed)
 Specialty Pharmacy Refill Coordination Note  Caitlin Greene is a 58 y.o. female contacted today regarding refills of specialty medication(s) Palbociclib  (Ibrance )   Patient requested Delivery   Delivery date: 02/02/24   Verified address: [address]  [place] [address]   Medication will be filled on 01/31/2025. This fill date is pending response to refill request from provider. Patient is aware and if they have not received fill by intended date they must follow up with pharmacy.

## 2024-01-31 NOTE — Progress Notes (Signed)
 Specialty Pharmacy Ongoing Clinical Assessment Note  Caitlin Greene is a 58 y.o. female who is being followed by the specialty pharmacy service for RxSp Oncology   Patient's specialty medication(s) reviewed today: Palbociclib  (Ibrance )   Missed doses in the last 4 weeks: 0   Patient/Caregiver did not have any additional questions or concerns.   Therapeutic benefit summary: Patient is achieving benefit   Adverse events/side effects summary: No adverse events/side effects   Patient's therapy is appropriate to: Continue    Goals Addressed             This Visit's Progress    Slow Disease Progression   On track    Patient is on track. Patient will maintain adherence. Per 01/29/24 visit, CT from 12/15/2023 is stable and patient should continue therapy.        Follow up: 6 months  [name] [name] Gallus Specialty Pharmacist

## 2024-02-05 ENCOUNTER — Ambulatory Visit

## 2024-02-06 ENCOUNTER — Inpatient Hospital Stay

## 2024-02-06 VITALS — BP 136/83 | HR 82 | Temp 98.0°F | Resp 20

## 2024-02-06 DIAGNOSIS — Z5111 Encounter for antineoplastic chemotherapy: Secondary | ICD-10-CM | POA: Diagnosis not present

## 2024-02-06 DIAGNOSIS — C50919 Malignant neoplasm of unspecified site of unspecified female breast: Secondary | ICD-10-CM

## 2024-02-06 MED ORDER — LORATADINE 10 MG PO TABS
10.0000 mg | ORAL_TABLET | Freq: Every day | ORAL | Status: DC
  Administered 2024-02-06: 10 mg via ORAL
  Filled 2024-02-06: qty 1

## 2024-02-06 MED ORDER — IRON SUCROSE 300 MG IVPB - SIMPLE MED
300.0000 mg | Freq: Once | Status: AC
  Administered 2024-02-06: 300 mg via INTRAVENOUS
  Filled 2024-02-06: qty 265

## 2024-02-06 MED ORDER — METHYLPREDNISOLONE SODIUM SUCC 125 MG IJ SOLR
125.0000 mg | Freq: Once | INTRAMUSCULAR | Status: AC
  Administered 2024-02-06: 125 mg via INTRAVENOUS
  Filled 2024-02-06: qty 2

## 2024-02-06 MED ORDER — SODIUM CHLORIDE 0.9 % IV SOLN: INTRAVENOUS | Status: DC

## 2024-02-22 ENCOUNTER — Other Ambulatory Visit: Payer: Self-pay | Admitting: Hematology & Oncology

## 2024-02-22 ENCOUNTER — Encounter (INDEPENDENT_AMBULATORY_CARE_PROVIDER_SITE_OTHER): Payer: Self-pay

## 2024-02-22 ENCOUNTER — Other Ambulatory Visit: Payer: Self-pay

## 2024-02-22 MED ORDER — IBRANCE 100 MG PO TABS
ORAL_TABLET | ORAL | 0 refills | Status: DC
  Filled 2024-02-22: qty 21, fill #0
  Filled 2024-02-23: qty 21, 28d supply, fill #0

## 2024-02-23 ENCOUNTER — Other Ambulatory Visit: Payer: Self-pay

## 2024-02-23 NOTE — Progress Notes (Signed)
 Specialty Pharmacy Refill Coordination Note  Caitlin Greene is a 57 y.o. female contacted today regarding refills of specialty medication(s) Palbociclib  (Ibrance )   Patient requested (Patient-Rptd) Delivery   Delivery date: 02/27/24   Verified address: (Patient-Rptd) [address] [place] [address]   Medication will be filled on 02/26/24.

## 2024-02-26 ENCOUNTER — Other Ambulatory Visit: Payer: Self-pay

## 2024-03-05 ENCOUNTER — Inpatient Hospital Stay

## 2024-03-05 ENCOUNTER — Encounter: Payer: Self-pay | Admitting: Hematology & Oncology

## 2024-03-05 ENCOUNTER — Inpatient Hospital Stay: Attending: Hematology & Oncology

## 2024-03-05 ENCOUNTER — Inpatient Hospital Stay: Admitting: Hematology & Oncology

## 2024-03-05 VITALS — BP 112/78 | HR 80 | Temp 97.8°F | Resp 18 | Ht 68.0 in | Wt 190.0 lb

## 2024-03-05 DIAGNOSIS — C50912 Malignant neoplasm of unspecified site of left female breast: Secondary | ICD-10-CM | POA: Diagnosis not present

## 2024-03-05 DIAGNOSIS — C50919 Malignant neoplasm of unspecified site of unspecified female breast: Secondary | ICD-10-CM | POA: Diagnosis not present

## 2024-03-05 DIAGNOSIS — D61818 Other pancytopenia: Secondary | ICD-10-CM | POA: Diagnosis not present

## 2024-03-05 DIAGNOSIS — Z5111 Encounter for antineoplastic chemotherapy: Secondary | ICD-10-CM | POA: Diagnosis present

## 2024-03-05 DIAGNOSIS — D509 Iron deficiency anemia, unspecified: Secondary | ICD-10-CM | POA: Diagnosis not present

## 2024-03-05 DIAGNOSIS — Z1722 Progesterone receptor negative status: Secondary | ICD-10-CM | POA: Insufficient documentation

## 2024-03-05 DIAGNOSIS — E538 Deficiency of other specified B group vitamins: Secondary | ICD-10-CM | POA: Insufficient documentation

## 2024-03-05 DIAGNOSIS — Z79899 Other long term (current) drug therapy: Secondary | ICD-10-CM | POA: Insufficient documentation

## 2024-03-05 DIAGNOSIS — Z170 Estrogen receptor positive status [ER+]: Secondary | ICD-10-CM | POA: Insufficient documentation

## 2024-03-05 DIAGNOSIS — Z95828 Presence of other vascular implants and grafts: Secondary | ICD-10-CM

## 2024-03-05 DIAGNOSIS — D500 Iron deficiency anemia secondary to blood loss (chronic): Secondary | ICD-10-CM

## 2024-03-05 DIAGNOSIS — C7951 Secondary malignant neoplasm of bone: Secondary | ICD-10-CM | POA: Insufficient documentation

## 2024-03-05 DIAGNOSIS — K922 Gastrointestinal hemorrhage, unspecified: Secondary | ICD-10-CM

## 2024-03-05 DIAGNOSIS — D519 Vitamin B12 deficiency anemia, unspecified: Secondary | ICD-10-CM

## 2024-03-05 DIAGNOSIS — R112 Nausea with vomiting, unspecified: Secondary | ICD-10-CM | POA: Insufficient documentation

## 2024-03-05 DIAGNOSIS — D510 Vitamin B12 deficiency anemia due to intrinsic factor deficiency: Secondary | ICD-10-CM

## 2024-03-05 DIAGNOSIS — C50011 Malignant neoplasm of nipple and areola, right female breast: Secondary | ICD-10-CM

## 2024-03-05 LAB — CMP (CANCER CENTER ONLY)
ALT: 10 U/L (ref 0–44)
AST: 13 U/L — ABNORMAL LOW (ref 15–41)
Albumin: 4.1 g/dL (ref 3.5–5.0)
Alkaline Phosphatase: 52 U/L (ref 38–126)
Anion gap: 9 (ref 5–15)
BUN: 13 mg/dL (ref 6–20)
CO2: 24 mmol/L (ref 22–32)
Calcium: 8.8 mg/dL — ABNORMAL LOW (ref 8.9–10.3)
Chloride: 107 mmol/L (ref 98–111)
Creatinine: 0.92 mg/dL (ref 0.44–1.00)
GFR, Estimated: >60 mL/min (ref >60)
Glucose, Bld: 85 mg/dL (ref 70–99)
Potassium: 4 mmol/L (ref 3.5–5.1)
Sodium: 140 mmol/L (ref 135–145)
Total Bilirubin: 0.5 mg/dL (ref 0.0–1.2)
Total Protein: 6.5 g/dL (ref 6.5–8.1)

## 2024-03-05 LAB — CBC WITH DIFFERENTIAL (CANCER CENTER ONLY)
Abs Immature Granulocytes: 0 K/uL (ref 0.00–0.07)
Basophils Absolute: 0 K/uL (ref 0.0–0.1)
Basophils Relative: 1 %
Eosinophils Absolute: 0 K/uL (ref 0.0–0.5)
Eosinophils Relative: 2 %
HCT: 32.1 % — ABNORMAL LOW (ref 36.0–46.0)
Hemoglobin: 11.1 g/dL — ABNORMAL LOW (ref 12.0–15.0)
Immature Granulocytes: 0 %
Lymphocytes Relative: 27 %
Lymphs Abs: 0.5 K/uL — ABNORMAL LOW (ref 0.7–4.0)
MCH: 36.6 pg — ABNORMAL HIGH (ref 26.0–34.0)
MCHC: 34.6 g/dL (ref 30.0–36.0)
MCV: 105.9 fL — ABNORMAL HIGH (ref 80.0–100.0)
Monocytes Absolute: 0.2 K/uL (ref 0.1–1.0)
Monocytes Relative: 9 %
Neutro Abs: 1 K/uL — ABNORMAL LOW (ref 1.7–7.7)
Neutrophils Relative %: 61 %
Platelet Count: 122 K/uL — ABNORMAL LOW (ref 150–400)
RBC: 3.03 MIL/uL — ABNORMAL LOW (ref 3.87–5.11)
RDW: 14.4 % (ref 11.5–15.5)
WBC Count: 1.7 K/uL — ABNORMAL LOW (ref 4.0–10.5)
nRBC: 0 % (ref 0.0–0.2)

## 2024-03-05 LAB — RETIC PANEL
Immature Retic Fract: 22.2 % — ABNORMAL HIGH (ref 2.3–15.9)
RBC.: 3.05 MIL/uL — ABNORMAL LOW (ref 3.87–5.11)
Retic Count, Absolute: 48.2 K/uL (ref 19.0–186.0)
Retic Ct Pct: 1.6 % (ref 0.4–3.1)
Reticulocyte Hemoglobin: 41 pg (ref >27.9)

## 2024-03-05 LAB — IRON AND IRON BINDING CAPACITY (CC-WL,HP ONLY)
Iron: 87 ug/dL (ref 28–170)
Saturation Ratios: 31 % (ref 10.4–31.8)
TIBC: 284 ug/dL (ref 250–450)
UIBC: 197 ug/dL

## 2024-03-05 LAB — VITAMIN B12: Vitamin B-12: 297 pg/mL (ref 180–914)

## 2024-03-05 LAB — FERRITIN: Ferritin: 104 ng/mL (ref 11–307)

## 2024-03-05 LAB — FOLATE: Folate: 7.6 ng/mL (ref >5.9)

## 2024-03-05 MED ORDER — DENOSUMAB 120 MG/1.7ML ~~LOC~~ SOLN
120.0000 mg | Freq: Once | SUBCUTANEOUS | Status: AC
  Administered 2024-03-05: 120 mg via SUBCUTANEOUS
  Filled 2024-03-05: qty 1.7

## 2024-03-05 MED ORDER — CYANOCOBALAMIN 1000 MCG/ML IJ SOLN
1000.0000 ug | INTRAMUSCULAR | Status: DC
  Administered 2024-03-05: 1000 ug via INTRAMUSCULAR
  Filled 2024-03-05: qty 1

## 2024-03-05 MED ORDER — FULVESTRANT 250 MG/5ML IM SOSY
500.0000 mg | PREFILLED_SYRINGE | INTRAMUSCULAR | Status: DC
  Administered 2024-03-05: 500 mg via INTRAMUSCULAR

## 2024-03-05 NOTE — Progress Notes (Signed)
 Hematology and Oncology Follow Up Visit  Caitlin Greene [id number] 11-10-1965 58 y.o. 03/05/2024   Principle Diagnosis:  Metastatic breast cancer-ER positive, PR negative/HER-2 negative --bone metastasis only Iron  deficiency anemia Vitamin B12 deficiency  Current Therapy:   Faslodex  500 mg IM monthly --start on 04/2020  Ibrance  100 mg p.o. daily (21d on/7d off) - start on 04/2020 Xgeva  120 mg subcu every 3 months -next dose 05/2024  IV iron -Venofer  given on 02/06/2024    Status post XRT for right sacral lesion -completed on 03/03/2023 -- 3000 rad Vitamin B12 1000 mcg IM monthly --start on 06/2023     Interim History:  Caitlin Greene is in for follow-up.  She is doing pretty well.  She really has had no complaint since we last saw her.  She still has a little bit of discomfort around the right hip area.  I know she is doing well with the vitamin B-12 injections.  When we last checked her B12 level, the result was 553 pg/mL.  Her last CA 27.29 was holding steady at 75.  She has had no problems with nausea or vomiting.  She has had no change in bowel or bladder habits.    She did get some IV iron  back in late September.  At that time, her iron  saturation was 20%.  She has had no issues with headache.  There is been no problems with bleeding.  She has had no leg swelling.  She has had no swollen lymph nodes.  Overall, I would say that her performance status is probably ECOG 0.   Wt Readings from Last 3 Encounters:  03/05/24 190 lb (86.2 kg)  01/29/24 186 lb 12.8 oz (84.7 kg)  01/01/24 184 lb (83.5 kg)    Medications:  Current Outpatient Medications:    acetaminophen  (TYLENOL ) 650 MG CR tablet, Take 650 mg by mouth every 8 (eight) hours as needed for pain., Disp: , Rfl:    calcium  carbonate (OS-CAL) 600 MG tablet, Take 1 tablet (600 mg total) by mouth 2 (two) times daily., Disp: 30 tablet, Rfl: 0   Ferrous Sulfate (IRON ) 325 (65 Fe) MG TABS, Take 1 tablet (325 mg total) by mouth  daily at 2 PM., Disp: 30 tablet, Rfl: 0   FLUoxetine  (PROZAC ) 20 MG capsule, TAKE 3 CAPSULES(60 MG) BY MOUTH DAILY, Disp: 270 capsule, Rfl: 3   fulvestrant  (FASLODEX ) 250 MG/5ML injection, Inject 500 mg into the muscle every 30 (thirty) days. One injection each buttock over 1-2 minutes. Warm prior to use., Disp: , Rfl:    IBRANCE  100 MG tablet, TAKE 1 TABLET DAILY ON DAYS 1 THROUGH 21 OF CYCLE, 7 DAYS OFF. REPEAT, Disp: 21 tablet, Rfl: 0   LORazepam  (ATIVAN ) 1 MG tablet, Take 1 tablet (1 mg total) by mouth once as needed for up to 1 dose for anxiety. Take 1 tablet (1 mg total) by mouth 20 minutes before MRI scan. Please have a driver for this test as this medication has sedating effects., Disp: 1 tablet, Rfl: 0   oxyCODONE  (OXY IR/ROXICODONE ) 5 MG immediate release tablet, Take 1 tablet (5 mg total) by mouth every 8 (eight) hours as needed for severe pain., Disp: 50 tablet, Rfl: 0   pantoprazole  (PROTONIX ) 40 MG tablet, Take 1 tablet (40 mg total) by mouth 2 (two) times daily., Disp: 60 tablet, Rfl: 5   vitamin B-12 (CYANOCOBALAMIN ) 1000 MCG tablet, Take 1 tablet (1,000 mcg total) by mouth daily., Disp: 30 tablet, Rfl: 0   Wheat Dextrin (BENEFIBER  DRINK MIX PO), Take by mouth daily at 6 (six) AM., Disp: , Rfl:    cetirizine (ZYRTEC ALLERGY) 10 MG tablet, Daily prn, Disp: , Rfl:   Allergies:  Allergies  Allergen Reactions   Amoxicillin-Pot Clavulanate Hives and Itching   Iron  Anaphylaxis and Other (See Comments)    Patient develops hypotension, resp distress after infusion of feraheme .    Penicillins Hives, Itching and Other (See Comments)   Codeine Nausea And Vomiting    Needs pre-meds     Past Medical History, Surgical history, Social history, and Family History were reviewed and updated.  Review of Systems: Review of Systems  Constitutional:  Negative for appetite change.  HENT:  Negative.    Eyes: Negative.   Respiratory: Negative.    Cardiovascular: Negative.   Gastrointestinal:   Negative for abdominal pain and nausea.  Endocrine: Negative.   Genitourinary: Negative.    Musculoskeletal:  Positive for arthralgias and back pain.  Skin: Negative.   Neurological:  Negative for dizziness.  Hematological: Negative.   Psychiatric/Behavioral: Negative.      Physical Exam:  height is 5' 8 (1.727 m) and weight is 190 lb (86.2 kg). Her oral temperature is 97.8 F (36.6 C). Her blood pressure is 112/78 and her pulse is 80. Her respiration is 18 and oxygen saturation is 95%.   Wt Readings from Last 3 Encounters:  03/05/24 190 lb (86.2 kg)  01/29/24 186 lb 12.8 oz (84.7 kg)  01/01/24 184 lb (83.5 kg)    Physical Exam Vitals reviewed.  HENT:     Head: Normocephalic and atraumatic.  Eyes:     Pupils: Pupils are equal, round, and reactive to light.  Cardiovascular:     Rate and Rhythm: Normal rate and regular rhythm.     Heart sounds: Normal heart sounds.  Pulmonary:     Effort: Pulmonary effort is normal.     Breath sounds: Normal breath sounds.  Abdominal:     General: Bowel sounds are normal.     Palpations: Abdomen is soft.  Musculoskeletal:        General: No tenderness or deformity. Normal range of motion.     Cervical back: Normal range of motion.  Lymphadenopathy:     Cervical: No cervical adenopathy.  Skin:    General: Skin is warm and dry.     Findings: No erythema or rash.  Neurological:     Mental Status: She is alert and oriented to person, place, and time.  Psychiatric:        Behavior: Behavior normal.        Thought Content: Thought content normal.        Judgment: Judgment normal.     Lab Results  Component Value Date   WBC 1.7 (L) 03/05/2024   HGB 11.1 (L) 03/05/2024   HCT 32.1 (L) 03/05/2024   MCV 105.9 (H) 03/05/2024   PLT 122 (L) 03/05/2024     Chemistry      Component Value Date/Time   NA 140 03/05/2024 1028   K 4.0 03/05/2024 1028   CL 107 03/05/2024 1028   CO2 24 03/05/2024 1028   BUN 13 03/05/2024 1028   CREATININE  0.92 03/05/2024 1028   CREATININE 0.88 10/22/2019 1246      Component Value Date/Time   CALCIUM  8.8 (L) 03/05/2024 1028   ALKPHOS 52 03/05/2024 1028   AST 13 (L) 03/05/2024 1028   ALT 10 03/05/2024 1028   BILITOT 0.5 03/05/2024 1028  Impression and Plan: Ms. Louk is a very charming 58 year old postmenopausal female with metastatic breast cancer. She is on antiestrogen therapy. She also has pancytopenia felt to be secondary to her Ibrance .   She has she has done incredibly well with her metastatic breast cancer.  We have only had her on antiestrogen therapy.  This has been going on for 4 years.  We will go ahead and get her set up with scans.  The left scans were done back in August.  Again, I would probably consider her for one of the newer PIK3CA agents.  Hopefully, the scans will look okay.  I will see her back in a month.

## 2024-03-05 NOTE — Patient Instructions (Signed)
 Fulvestrant  Injection What is this medication? FULVESTRANT  (ful VES trant) treats breast cancer. It works by blocking the hormone estrogen in breast tissue, which prevents breast cancer cells from spreading or growing. This medicine may be used for other purposes; ask your health care provider or pharmacist if you have questions. COMMON BRAND NAME(S): FASLODEX  What should I tell my care team before I take this medication? They need to know if you have any of these conditions: Bleeding disorder Liver disease Low blood cell levels (white cells, red cells, and platelets) An unusual or allergic reaction to fulvestrant , other medications, foods, dyes, or preservatives Pregnant or trying to get pregnant Breastfeeding How should I use this medication? This medication is injected into a muscle. It is given by your care team in a hospital or clinic setting. Talk to your care team about the use of this medication in children. Special care may be needed. Overdosage: If you think you have taken too much of this medicine contact a poison control center or emergency room at once. NOTE: This medicine is only for you. Do not share this medicine with others. What if I miss a dose? Keep appointments for follow-up doses. It is important not to miss your dose. Call your care team if you are unable to keep an appointment. What may interact with this medication? Fluoroestradiol F18 This list may not describe all possible interactions. Give your health care provider a list of all the medicines, herbs, non-prescription drugs, or dietary supplements you use. Also tell them if you smoke, drink alcohol, or use illegal drugs. Some items may interact with your medicine. What should I watch for while using this medication? Your condition will be monitored carefully while you are receiving this medication. You may need blood work done while you are taking this medication. This medication is injected into a muscle. Talk  to your care team if you also take medications that prevent or treat blood clots, such as warfarin. Blood thinners may increase the risk of bleeding or bruising in the muscle where this medication is injected. The benefits of this medication may outweigh the risks. Your care team can help you find the option that works for you. They can also help limit the risk of bleeding. Talk to your care team if you may be pregnant. Serious birth defects can occur if you take this medication during pregnancy and for 1 year after the last dose. You will need a negative pregnancy test before starting this medication. Contraception is recommended while taking this medication and for 1 year after the last dose. Your care team can help you find the option that works for you. Do not breastfeed while taking this medication and for 1 year after the last dose. This medication may cause infertility. Talk to your care team if you are concerned about your fertility. What side effects may I notice from receiving this medication? Side effects that you should report to your care team as soon as possible: Allergic reactions or angioedema--skin rash, itching or hives, swelling of the face, eyes, lips, tongue, arms, or legs, trouble swallowing or breathing Pain, tingling, or numbness in the hands or feet Side effects that usually do not require medical attention (report to your care team if they continue or are bothersome): Bone, joint, or muscle pain Constipation Headache Hot flashes Nausea Pain, redness, or irritation at injection site Unusual weakness or fatigue This list may not describe all possible side effects. Call your doctor for medical advice about side  effects. You may report side effects to FDA at 1-800-FDA-1088. Where should I keep my medication? This medication is given in a hospital or clinic. It will not be stored at home. NOTE: This sheet is a summary. It may not cover all possible information. If you have  questions about this medicine, talk to your doctor, pharmacist, or health care provider.  2024 Elsevier/Gold Standard (2022-12-30 00:00:00)Denosumab  Injection (Oncology) What is this medication? DENOSUMAB  (den oh SUE mab) prevents weakened bones caused by cancer. It may also be used to treat noncancerous bone tumors that cannot be removed by surgery. It can also be used to treat high calcium  levels in the blood caused by cancer. It works by blocking a protein that causes bones to break down quickly. This slows down the release of calcium  from bones, which lowers calcium  levels in your blood. It also makes your bones stronger and less likely to break (fracture). This medicine may be used for other purposes; ask your health care provider or pharmacist if you have questions. COMMON BRAND NAME(S): XGEVA  What should I tell my care team before I take this medication? They need to know if you have any of these conditions: Dental disease Having surgery or tooth extraction Infection Kidney disease Low levels of calcium  or vitamin D in the blood Malnutrition On hemodialysis Skin conditions or sensitivity Thyroid  or parathyroid disease An unusual reaction to denosumab , other medications, foods, dyes, or preservatives Pregnant or trying to get pregnant Breast-feeding How should I use this medication? This medication is for injection under the skin. It is given by your care team in a hospital or clinic setting. A special MedGuide will be given to you before each treatment. Be sure to read this information carefully each time. Talk to your care team about the use of this medication in children. While it may be prescribed for children as young as 13 years for selected conditions, precautions do apply. Overdosage: If you think you have taken too much of this medicine contact a poison control center or emergency room at once. NOTE: This medicine is only for you. Do not share this medicine with others. What  if I miss a dose? Keep appointments for follow-up doses. It is important not to miss your dose. Call your care team if you are unable to keep an appointment. What may interact with this medication? Do not take this medication with any of the following: Other medications containing denosumab  This medication may also interact with the following: Medications that lower your chance of fighting infection Steroid medications, such as prednisone  or cortisone This list may not describe all possible interactions. Give your health care provider a list of all the medicines, herbs, non-prescription drugs, or dietary supplements you use. Also tell them if you smoke, drink alcohol, or use illegal drugs. Some items may interact with your medicine. What should I watch for while using this medication? Your condition will be monitored carefully while you are receiving this medication. You may need blood work while taking this medication. This medication may increase your risk of getting an infection. Call your care team for advice if you get a fever, chills, sore throat, or other symptoms of a cold or flu. Do not treat yourself. Try to avoid being around people who are sick. You should make sure you get enough calcium  and vitamin D while you are taking this medication, unless your care team tells you not to. Discuss the foods you eat and the vitamins you take with your  care team. Some people who take this medication have severe bone, joint, or muscle pain. This medication may also increase your risk for jaw problems or a broken thigh bone. Tell your care team right away if you have severe pain in your jaw, bones, joints, or muscles. Tell your care team if you have any pain that does not go away or that gets worse. Talk to your care team if you may be pregnant. Serious birth defects can occur if you take this medication during pregnancy and for 5 months after the last dose. You will need a negative pregnancy test before  starting this medication. Contraception is recommended while taking this medication and for 5 months after the last dose. Your care team can help you find the option that works for you. What side effects may I notice from receiving this medication? Side effects that you should report to your care team as soon as possible: Allergic reactions--skin rash, itching, hives, swelling of the face, lips, tongue, or throat Bone, joint, or muscle pain Low calcium  level--muscle pain or cramps, confusion, tingling, or numbness in the hands or feet Osteonecrosis of the jaw--pain, swelling, or redness in the mouth, numbness of the jaw, poor healing after dental work, unusual discharge from the mouth, visible bones in the mouth Side effects that usually do not require medical attention (report to your care team if they continue or are bothersome): Cough Diarrhea Fatigue Headache Nausea This list may not describe all possible side effects. Call your doctor for medical advice about side effects. You may report side effects to FDA at 1-800-FDA-1088. Where should I keep my medication? This medication is given in a hospital or clinic. It will not be stored at home. NOTE: This sheet is a summary. It may not cover all possible information. If you have questions about this medicine, talk to your doctor, pharmacist, or health care provider.  2024 Elsevier/Gold Standard (2021-09-15 00:00:00)Vitamin B12 Injection What is this medication? Vitamin B12 (VAHY tuh min B12) prevents and treats low vitamin B12 levels in your body. It is used in people who do not get enough vitamin B12 from their diet or when their digestive tract does not absorb enough. Vitamin B12 plays an important role in maintaining the health of your nervous system and red blood cells. This medicine may be used for other purposes; ask your health care provider or pharmacist if you have questions. COMMON BRAND NAME(S): B-12 Compliance Kit, B-12 Injection  Kit, Cyomin, Dodex , LA-12, Nutri-Twelve, Physicians EZ Use B-12, Primabalt, Vitamin Deficiency Injectable System - B12 What should I tell my care team before I take this medication? They need to know if you have any of these conditions: Kidney disease Leber's disease Megaloblastic anemia An unusual or allergic reaction to cyanocobalamin , cobalt, other medications, foods, dyes, or preservatives Pregnant or trying to get pregnant Breast-feeding How should I use this medication? This medication is injected into a muscle or deeply under the skin. It is usually given in a clinic or care team's office. However, your care team may teach you how to inject yourself. Follow all instructions. Talk to your care team about the use of this medication in children. Special care may be needed. Overdosage: If you think you have taken too much of this medicine contact a poison control center or emergency room at once. NOTE: This medicine is only for you. Do not share this medicine with others. What if I miss a dose? If you are given your dose at a  clinic or care team's office, call to reschedule your appointment. If you give your own injections, and you miss a dose, take it as soon as you can. If it is almost time for your next dose, take only that dose. Do not take double or extra doses. What may interact with this medication? Alcohol Colchicine This list may not describe all possible interactions. Give your health care provider a list of all the medicines, herbs, non-prescription drugs, or dietary supplements you use. Also tell them if you smoke, drink alcohol, or use illegal drugs. Some items may interact with your medicine. What should I watch for while using this medication? Visit your care team regularly. You may need blood work done while you are taking this medication. You may need to follow a special diet. Talk to your care team. Limit your alcohol intake and avoid smoking to get the best benefit. What  side effects may I notice from receiving this medication? Side effects that you should report to your care team as soon as possible: Allergic reactions--skin rash, itching, hives, swelling of the face, lips, tongue, or throat Swelling of the ankles, hands, or feet Trouble breathing Side effects that usually do not require medical attention (report to your care team if they continue or are bothersome): Diarrhea This list may not describe all possible side effects. Call your doctor for medical advice about side effects. You may report side effects to FDA at 1-800-FDA-1088. Where should I keep my medication? Keep out of the reach of children. Store at room temperature between 15 and 30 degrees C (59 and 85 degrees F). Protect from light. Throw away any unused medication after the expiration date. NOTE: This sheet is a summary. It may not cover all possible information. If you have questions about this medicine, talk to your doctor, pharmacist, or health care provider.  2024 Elsevier/Gold Standard (2021-01-05 00:00:00)

## 2024-03-06 ENCOUNTER — Ambulatory Visit: Payer: Self-pay | Admitting: Hematology & Oncology

## 2024-03-06 LAB — CANCER ANTIGEN 27.29: CA 27.29: 95.9 U/mL — ABNORMAL HIGH (ref 0.0–38.6)

## 2024-03-18 ENCOUNTER — Other Ambulatory Visit: Payer: Self-pay

## 2024-03-18 ENCOUNTER — Other Ambulatory Visit: Payer: Self-pay | Admitting: Hematology & Oncology

## 2024-03-18 MED ORDER — IBRANCE 100 MG PO TABS
ORAL_TABLET | ORAL | 0 refills | Status: DC
  Filled 2024-03-18: qty 21, fill #0
  Filled 2024-03-20: qty 21, 28d supply, fill #0

## 2024-03-20 ENCOUNTER — Other Ambulatory Visit: Payer: Self-pay | Admitting: Pharmacy Technician

## 2024-03-20 ENCOUNTER — Other Ambulatory Visit: Payer: Self-pay

## 2024-03-20 NOTE — Progress Notes (Signed)
 Specialty Pharmacy Refill Coordination Note  Caitlin Greene is a 58 y.o. female contacted today regarding refills of specialty medication(s) Palbociclib  (Ibrance )   Patient requested Delivery   Delivery date: 03/29/24   Verified address: [address] [place]   Medication will be filled on: 03/28/24

## 2024-03-21 ENCOUNTER — Encounter (HOSPITAL_COMMUNITY)
Admission: RE | Admit: 2024-03-21 | Discharge: 2024-03-21 | Disposition: A | Discharge status: Home / Self Care | Source: Ambulatory Visit | Attending: Hematology & Oncology | Admitting: Hematology & Oncology

## 2024-03-21 ENCOUNTER — Ambulatory Visit (HOSPITAL_COMMUNITY)
Admission: RE | Admit: 2024-03-21 | Discharge: 2024-03-21 | Disposition: A | Discharge status: Home / Self Care | Source: Ambulatory Visit | Attending: Hematology & Oncology | Admitting: Hematology & Oncology

## 2024-03-21 DIAGNOSIS — C50919 Malignant neoplasm of unspecified site of unspecified female breast: Secondary | ICD-10-CM | POA: Insufficient documentation

## 2024-03-21 DIAGNOSIS — C7951 Secondary malignant neoplasm of bone: Secondary | ICD-10-CM | POA: Insufficient documentation

## 2024-03-21 DIAGNOSIS — Z9011 Acquired absence of right breast and nipple: Secondary | ICD-10-CM | POA: Diagnosis not present

## 2024-03-21 DIAGNOSIS — K449 Diaphragmatic hernia without obstruction or gangrene: Secondary | ICD-10-CM | POA: Diagnosis not present

## 2024-03-21 DIAGNOSIS — Z79810 Long term (current) use of selective estrogen receptor modulators (SERMs): Secondary | ICD-10-CM | POA: Insufficient documentation

## 2024-03-21 DIAGNOSIS — R935 Abnormal findings on diagnostic imaging of other abdominal regions, including retroperitoneum: Secondary | ICD-10-CM | POA: Diagnosis not present

## 2024-03-21 MED ORDER — TECHNETIUM TC 99M MEDRONATE IV KIT
20.0000 | PACK | Freq: Once | INTRAVENOUS | Status: AC | PRN
  Administered 2024-03-21: 21.3 via INTRAVENOUS

## 2024-03-21 MED ORDER — IOHEXOL 300 MG/ML  SOLN
100.0000 mL | Freq: Once | INTRAMUSCULAR | Status: AC | PRN
  Administered 2024-03-21: 100 mL via INTRAVENOUS

## 2024-03-21 MED ORDER — HEPARIN SOD (PORK) LOCK FLUSH 100 UNIT/ML IV SOLN
500.0000 [IU] | Freq: Once | INTRAVENOUS | Status: AC
  Administered 2024-03-21: 500 [IU] via INTRAVENOUS

## 2024-03-21 MED ORDER — SODIUM CHLORIDE (PF) 0.9 % IJ SOLN
INTRAMUSCULAR | Status: AC
  Filled 2024-03-21: qty 50

## 2024-03-21 MED ORDER — HEPARIN SOD (PORK) LOCK FLUSH 100 UNIT/ML IV SOLN
INTRAVENOUS | Status: AC
  Filled 2024-03-21: qty 5

## 2024-03-22 ENCOUNTER — Ambulatory Visit: Payer: Self-pay | Admitting: Hematology & Oncology

## 2024-03-22 ENCOUNTER — Encounter: Payer: Self-pay | Admitting: *Deleted

## 2024-03-28 ENCOUNTER — Other Ambulatory Visit: Payer: Self-pay

## 2024-04-09 ENCOUNTER — Inpatient Hospital Stay: Attending: Hematology & Oncology

## 2024-04-09 ENCOUNTER — Encounter: Payer: Self-pay | Admitting: Hematology & Oncology

## 2024-04-09 ENCOUNTER — Ambulatory Visit: Payer: Self-pay | Admitting: Hematology & Oncology

## 2024-04-09 ENCOUNTER — Inpatient Hospital Stay

## 2024-04-09 ENCOUNTER — Other Ambulatory Visit: Payer: Self-pay | Admitting: *Deleted

## 2024-04-09 ENCOUNTER — Inpatient Hospital Stay: Admitting: Hematology & Oncology

## 2024-04-09 ENCOUNTER — Other Ambulatory Visit: Payer: Self-pay

## 2024-04-09 ENCOUNTER — Encounter: Payer: Self-pay | Admitting: *Deleted

## 2024-04-09 VITALS — BP 127/76 | HR 78 | Temp 97.6°F | Resp 18 | Ht 68.0 in | Wt 185.0 lb

## 2024-04-09 DIAGNOSIS — Z79818 Long term (current) use of other agents affecting estrogen receptors and estrogen levels: Secondary | ICD-10-CM | POA: Insufficient documentation

## 2024-04-09 DIAGNOSIS — E538 Deficiency of other specified B group vitamins: Secondary | ICD-10-CM | POA: Diagnosis not present

## 2024-04-09 DIAGNOSIS — Z79899 Other long term (current) drug therapy: Secondary | ICD-10-CM | POA: Insufficient documentation

## 2024-04-09 DIAGNOSIS — D61818 Other pancytopenia: Secondary | ICD-10-CM | POA: Diagnosis not present

## 2024-04-09 DIAGNOSIS — C50919 Malignant neoplasm of unspecified site of unspecified female breast: Secondary | ICD-10-CM | POA: Diagnosis not present

## 2024-04-09 DIAGNOSIS — N390 Urinary tract infection, site not specified: Secondary | ICD-10-CM

## 2024-04-09 DIAGNOSIS — C7951 Secondary malignant neoplasm of bone: Secondary | ICD-10-CM

## 2024-04-09 LAB — URINALYSIS, ROUTINE W REFLEX MICROSCOPIC
Bilirubin Urine: NEGATIVE
Glucose, UA: NEGATIVE mg/dL
Hgb urine dipstick: NEGATIVE
Ketones, ur: 40 mg/dL — AB
Nitrite: POSITIVE — AB
Protein, ur: NEGATIVE mg/dL
Specific Gravity, Urine: 1.02 (ref 1.005–1.030)
pH: 6.5 (ref 5.0–8.0)

## 2024-04-09 LAB — CMP (CANCER CENTER ONLY)
ALT: 10 U/L (ref 0–44)
AST: 13 U/L — ABNORMAL LOW (ref 15–41)
Albumin: 4.1 g/dL (ref 3.5–5.0)
Alkaline Phosphatase: 58 U/L (ref 38–126)
Anion gap: 11 (ref 5–15)
BUN: 8 mg/dL (ref 6–20)
CO2: 24 mmol/L (ref 22–32)
Calcium: 8.4 mg/dL — ABNORMAL LOW (ref 8.9–10.3)
Chloride: 105 mmol/L (ref 98–111)
Creatinine: 0.86 mg/dL (ref 0.44–1.00)
GFR, Estimated: >60 mL/min (ref >60)
Glucose, Bld: 85 mg/dL (ref 70–99)
Potassium: 3.6 mmol/L (ref 3.5–5.1)
Sodium: 140 mmol/L (ref 135–145)
Total Bilirubin: 0.5 mg/dL (ref 0.0–1.2)
Total Protein: 6.4 g/dL — ABNORMAL LOW (ref 6.5–8.1)

## 2024-04-09 LAB — CBC WITH DIFFERENTIAL (CANCER CENTER ONLY)
Abs Immature Granulocytes: 0.01 K/uL (ref 0.00–0.07)
Basophils Absolute: 0 K/uL (ref 0.0–0.1)
Basophils Relative: 2 %
Eosinophils Absolute: 0 K/uL (ref 0.0–0.5)
Eosinophils Relative: 1 %
HCT: 29.5 % — ABNORMAL LOW (ref 36.0–46.0)
Hemoglobin: 10.5 g/dL — ABNORMAL LOW (ref 12.0–15.0)
Immature Granulocytes: 1 %
Lymphocytes Relative: 28 %
Lymphs Abs: 0.5 K/uL — ABNORMAL LOW (ref 0.7–4.0)
MCH: 37 pg — ABNORMAL HIGH (ref 26.0–34.0)
MCHC: 35.6 g/dL (ref 30.0–36.0)
MCV: 103.9 fL — ABNORMAL HIGH (ref 80.0–100.0)
Monocytes Absolute: 0.2 K/uL (ref 0.1–1.0)
Monocytes Relative: 11 %
Neutro Abs: 1 K/uL — ABNORMAL LOW (ref 1.7–7.7)
Neutrophils Relative %: 57 %
Platelet Count: 103 K/uL — ABNORMAL LOW (ref 150–400)
RBC: 2.84 MIL/uL — ABNORMAL LOW (ref 3.87–5.11)
RDW: 14.5 % (ref 11.5–15.5)
Smear Review: NORMAL
WBC Count: 1.6 K/uL — ABNORMAL LOW (ref 4.0–10.5)
nRBC: 0 % (ref 0.0–0.2)

## 2024-04-09 LAB — IRON AND IRON BINDING CAPACITY (CC-WL,HP ONLY)
Iron: 94 ug/dL (ref 28–170)
Saturation Ratios: 35 % — ABNORMAL HIGH (ref 10.4–31.8)
TIBC: 269 ug/dL (ref 250–450)
UIBC: 175 ug/dL

## 2024-04-09 LAB — RETICULOCYTES
Immature Retic Fract: 19.7 % — ABNORMAL HIGH (ref 2.3–15.9)
RBC.: 2.84 MIL/uL — ABNORMAL LOW (ref 3.87–5.11)
Retic Count, Absolute: 36.9 K/uL (ref 19.0–186.0)
Retic Ct Pct: 1.3 % (ref 0.4–3.1)

## 2024-04-09 LAB — URINALYSIS, MICROSCOPIC (REFLEX): RBC / HPF: NONE SEEN RBC/hpf (ref 0–5)

## 2024-04-09 LAB — FERRITIN: Ferritin: 205 ng/mL (ref 11–307)

## 2024-04-09 MED ORDER — FULVESTRANT 250 MG/5ML IM SOSY
500.0000 mg | PREFILLED_SYRINGE | INTRAMUSCULAR | Status: DC
  Administered 2024-04-09: 500 mg via INTRAMUSCULAR
  Filled 2024-04-09: qty 10

## 2024-04-09 MED ORDER — CIPROFLOXACIN HCL 500 MG PO TABS: 500.0000 mg | ORAL_TABLET | Freq: Two times a day (BID) | ORAL | 0 refills | Status: AC

## 2024-04-09 MED ORDER — CYANOCOBALAMIN 1000 MCG/ML IJ SOLN
1000.0000 ug | INTRAMUSCULAR | Status: DC
  Administered 2024-04-09: 1000 ug via INTRAMUSCULAR
  Filled 2024-04-09: qty 1

## 2024-04-09 NOTE — Progress Notes (Signed)
 Hematology and Oncology Follow Up Visit  Caitlin Greene [id number] November 19, 1965 58 y.o. 04/09/2024   Principle Diagnosis:  Metastatic breast cancer-ER positive, PR negative/HER-2 negative --bone metastasis only Iron  deficiency anemia Vitamin B12 deficiency  Current Therapy:   Faslodex  500 mg IM monthly --start on 04/2020  Ibrance  100 mg p.o. daily (21d on/7d off) - start on 04/2020 - d/c on 04/09/2024 Xgeva  120 mg subcu every 3 months -next dose 05/2024  IV iron -Venofer  given on 02/06/2024    Status post XRT for right sacral lesion -completed on 03/03/2023 -- 3000 rad Vitamin B12 1000 mcg IM monthly --start on 06/2023     Interim History:  Caitlin Greene is in for follow-up.  We did do a CT scan of the bone scan on her.  So far, everything actually looked relatively stable.  However, the tumor marker has been going up steadily.  Her tumor marker when we saw her was 95 with respect to the CA 27.29.  She has been on antiestrogen therapy for probably about 4 years.  She has done incredibly well.  I think that we may have to make a change.  I need to check her to see if she has the ESR1 mutation.  If so, then I can use one of the new agents- Orserdu.  I think another option might be using Datroway.  She did have a nice Thanksgiving.  She is still active.  She has a good performance status.  She did have some bleeding.  Apparently she stopped the Protonix  and noted to have black stools.  This was right before Thanksgiving.  She is back on Protonix  and everything is much better now.  She does have occasional pain in the hips.  I know that she has had radiotherapy before.  She does get vitamin B-12.  This has helped her with respect to stamina.  Currently, her performance status is probably ECOG 1.    Wt Readings from Last 3 Encounters:  04/09/24 185 lb (83.9 kg)  03/05/24 190 lb (86.2 kg)  01/29/24 186 lb 12.8 oz (84.7 kg)    Medications:  Current Outpatient Medications:     acetaminophen  (TYLENOL ) 650 MG CR tablet, Take 650 mg by mouth every 8 (eight) hours as needed for pain., Disp: , Rfl:    calcium  carbonate (OS-CAL) 600 MG tablet, Take 1 tablet (600 mg total) by mouth 2 (two) times daily., Disp: 30 tablet, Rfl: 0   cetirizine (ZYRTEC ALLERGY) 10 MG tablet, Daily prn, Disp: , Rfl:    Ferrous Sulfate (IRON ) 325 (65 Fe) MG TABS, Take 1 tablet (325 mg total) by mouth daily at 2 PM., Disp: 30 tablet, Rfl: 0   FLUoxetine  (PROZAC ) 20 MG capsule, TAKE 3 CAPSULES(60 MG) BY MOUTH DAILY, Disp: 270 capsule, Rfl: 3   fulvestrant  (FASLODEX ) 250 MG/5ML injection, Inject 500 mg into the muscle every 30 (thirty) days. One injection each buttock over 1-2 minutes. Warm prior to use., Disp: , Rfl:    IBRANCE  100 MG tablet, TAKE 1 TABLET DAILY ON DAYS 1 THROUGH 21 OF CYCLE, 7 DAYS OFF. REPEAT, Disp: 21 tablet, Rfl: 0   LORazepam  (ATIVAN ) 1 MG tablet, Take 1 tablet (1 mg total) by mouth once as needed for up to 1 dose for anxiety. Take 1 tablet (1 mg total) by mouth 20 minutes before MRI scan. Please have a driver for this test as this medication has sedating effects., Disp: 1 tablet, Rfl: 0   oxyCODONE  (OXY IR/ROXICODONE ) 5 MG immediate release  tablet, Take 1 tablet (5 mg total) by mouth every 8 (eight) hours as needed for severe pain., Disp: 50 tablet, Rfl: 0   pantoprazole  (PROTONIX ) 40 MG tablet, Take 1 tablet (40 mg total) by mouth 2 (two) times daily., Disp: 60 tablet, Rfl: 5   vitamin B-12 (CYANOCOBALAMIN ) 1000 MCG tablet, Take 1 tablet (1,000 mcg total) by mouth daily., Disp: 30 tablet, Rfl: 0   Wheat Dextrin (BENEFIBER DRINK MIX PO), Take by mouth daily at 6 (six) AM., Disp: , Rfl:  No current facility-administered medications for this visit.  Facility-Administered Medications Ordered in Other Visits:    cyanocobalamin  (VITAMIN B12) injection 1,000 mcg, 1,000 mcg, Intramuscular, Weekly, Jaycob Mcclenton R, MD   fulvestrant  (FASLODEX ) injection 500 mg, 500 mg, Intramuscular, Q30  days, Neziah Braley, Maude SAUNDERS, MD  Allergies:  Allergies  Allergen Reactions   Amoxicillin-Pot Clavulanate Hives and Itching   Iron  Anaphylaxis and Other (See Comments)    Patient develops hypotension, resp distress after infusion of feraheme .    Penicillins Hives, Itching and Other (See Comments)   Codeine Nausea And Vomiting    Needs pre-meds     Past Medical History, Surgical history, Social history, and Family History were reviewed and updated.  Review of Systems: Review of Systems  Constitutional:  Negative for appetite change.  HENT:  Negative.    Eyes: Negative.   Respiratory: Negative.    Cardiovascular: Negative.   Gastrointestinal:  Negative for abdominal pain and nausea.  Endocrine: Negative.   Genitourinary: Negative.    Musculoskeletal:  Positive for arthralgias and back pain.  Skin: Negative.   Neurological:  Negative for dizziness.  Hematological: Negative.   Psychiatric/Behavioral: Negative.      Physical Exam:  height is 5' 8 (1.727 m) and weight is 185 lb (83.9 kg). Her oral temperature is 97.6 F (36.4 C). Her blood pressure is 127/76 and her pulse is 78. Her respiration is 18 and oxygen saturation is 100%.   Wt Readings from Last 3 Encounters:  04/09/24 185 lb (83.9 kg)  03/05/24 190 lb (86.2 kg)  01/29/24 186 lb 12.8 oz (84.7 kg)    Physical Exam Vitals reviewed.  HENT:     Head: Normocephalic and atraumatic.  Eyes:     Pupils: Pupils are equal, round, and reactive to light.  Cardiovascular:     Rate and Rhythm: Normal rate and regular rhythm.     Heart sounds: Normal heart sounds.  Pulmonary:     Effort: Pulmonary effort is normal.     Breath sounds: Normal breath sounds.  Abdominal:     General: Bowel sounds are normal.     Palpations: Abdomen is soft.  Musculoskeletal:        General: No tenderness or deformity. Normal range of motion.     Cervical back: Normal range of motion.  Lymphadenopathy:     Cervical: No cervical adenopathy.   Skin:    General: Skin is warm and dry.     Findings: No erythema or rash.  Neurological:     Mental Status: She is alert and oriented to person, place, and time.  Psychiatric:        Behavior: Behavior normal.        Thought Content: Thought content normal.        Judgment: Judgment normal.     Lab Results  Component Value Date   WBC 1.6 (L) 04/09/2024   HGB 10.5 (L) 04/09/2024   HCT 29.5 (L) 04/09/2024   MCV 103.9 (  H) 04/09/2024   PLT 103 (L) 04/09/2024     Chemistry      Component Value Date/Time   NA 140 03/05/2024 1028   K 4.0 03/05/2024 1028   CL 107 03/05/2024 1028   CO2 24 03/05/2024 1028   BUN 13 03/05/2024 1028   CREATININE 0.92 03/05/2024 1028   CREATININE 0.88 10/22/2019 1246      Component Value Date/Time   CALCIUM  8.8 (L) 03/05/2024 1028   ALKPHOS 52 03/05/2024 1028   AST 13 (L) 03/05/2024 1028   ALT 10 03/05/2024 1028   BILITOT 0.5 03/05/2024 1028      Impression and Plan: Ms. Brandstetter is a very charming 58 year old postmenopausal female with metastatic breast cancer. She is on antiestrogen therapy. She also has pancytopenia felt to be secondary to her Ibrance .   Again, I am going to make a change.  I am going to do a liquid biopsy on her.  I really need to see if she does have the ERS1 mutation.  I also may want to look into the PIK3CA mutation.  We will go ahead with the Faslodex  today.  She will get her vitamin B-12 today.  We will see what the liquid biopsy has to show.  Hopefully, we will find something that we can utilize.  We will plan to get her back after the Holiday season.  It would be nice to give her some time off so she can enjoy the holidays with her family.  MAUDE JONELLE CREASE, MD

## 2024-04-09 NOTE — Patient Instructions (Signed)
 Fulvestrant Injection What is this medication? FULVESTRANT (ful VES trant) treats breast cancer. It works by blocking the hormone estrogen in breast tissue, which prevents breast cancer cells from spreading or growing. This medicine may be used for other purposes; ask your health care provider or pharmacist if you have questions. COMMON BRAND NAME(S): FASLODEX What should I tell my care team before I take this medication? They need to know if you have any of these conditions: Bleeding disorder Liver disease Low blood cell levels (white cells, red cells, and platelets) An unusual or allergic reaction to fulvestrant, other medications, foods, dyes, or preservatives Pregnant or trying to get pregnant Breastfeeding How should I use this medication? This medication is injected into a muscle. It is given by your care team in a hospital or clinic setting. Talk to your care team about the use of this medication in children. Special care may be needed. Overdosage: If you think you have taken too much of this medicine contact a poison control center or emergency room at once. NOTE: This medicine is only for you. Do not share this medicine with others. What if I miss a dose? Keep appointments for follow-up doses. It is important not to miss your dose. Call your care team if you are unable to keep an appointment. What may interact with this medication? Fluoroestradiol F18 This list may not describe all possible interactions. Give your health care provider a list of all the medicines, herbs, non-prescription drugs, or dietary supplements you use. Also tell them if you smoke, drink alcohol, or use illegal drugs. Some items may interact with your medicine. What should I watch for while using this medication? Your condition will be monitored carefully while you are receiving this medication. You may need blood work done while you are taking this medication. This medication is injected into a muscle. Talk  to your care team if you also take medications that prevent or treat blood clots, such as warfarin. Blood thinners may increase the risk of bleeding or bruising in the muscle where this medication is injected. The benefits of this medication may outweigh the risks. Your care team can help you find the option that works for you. They can also help limit the risk of bleeding. Talk to your care team if you may be pregnant. Serious birth defects can occur if you take this medication during pregnancy and for 1 year after the last dose. You will need a negative pregnancy test before starting this medication. Contraception is recommended while taking this medication and for 1 year after the last dose. Your care team can help you find the option that works for you. Do not breastfeed while taking this medication and for 1 year after the last dose. This medication may cause infertility. Talk to your care team if you are concerned about your fertility. What side effects may I notice from receiving this medication? Side effects that you should report to your care team as soon as possible: Allergic reactions or angioedema--skin rash, itching or hives, swelling of the face, eyes, lips, tongue, arms, or legs, trouble swallowing or breathing Pain, tingling, or numbness in the hands or feet Side effects that usually do not require medical attention (report to your care team if they continue or are bothersome): Bone, joint, or muscle pain Constipation Headache Hot flashes Nausea Pain, redness, or irritation at injection site Unusual weakness or fatigue This list may not describe all possible side effects. Call your doctor for medical advice about side  effects. You may report side effects to FDA at 1-800-FDA-1088. Where should I keep my medication? This medication is given in a hospital or clinic. It will not be stored at home. NOTE: This sheet is a summary. It may not cover all possible information. If you have  questions about this medicine, talk to your doctor, pharmacist, or health care provider.  2024 Elsevier/Gold Standard (2022-12-30 00:00:00)

## 2024-04-09 NOTE — Patient Instructions (Signed)

## 2024-04-09 NOTE — Progress Notes (Signed)
 Port reaccessed for liquid biopsy per dr Timmy order.

## 2024-04-09 NOTE — Progress Notes (Signed)
 Caris (whole blood) has been completed. [employer] to pick up in [facility] ER lab. Leretha to call [employer] for pickup.

## 2024-04-10 ENCOUNTER — Other Ambulatory Visit: Payer: Self-pay

## 2024-04-10 LAB — CANCER ANTIGEN 27.29: CA 27.29: 99.3 U/mL — ABNORMAL HIGH (ref 0.0–38.6)

## 2024-04-11 MED ORDER — CARBOXYMETHYLCELL-GLYCERIN PF 0.5-0.9 % OP SOLN: 2.0000 [drp] | Freq: Four times a day (QID) | OPHTHALMIC | 11 refills | Status: AC

## 2024-04-11 MED ORDER — ONDANSETRON HCL 8 MG PO TABS: 8.0000 mg | ORAL_TABLET | Freq: Three times a day (TID) | ORAL | 1 refills | Status: AC | PRN

## 2024-04-11 MED ORDER — PROCHLORPERAZINE MALEATE 10 MG PO TABS: 10.0000 mg | ORAL_TABLET | Freq: Four times a day (QID) | ORAL | 1 refills | Status: DC | PRN

## 2024-04-11 MED ORDER — DEXAMETHASONE 4 MG PO TABS: ORAL_TABLET | ORAL | 1 refills | Status: AC

## 2024-04-11 MED ORDER — DEXAMETHASONE 0.5 MG/5ML PO SOLN: ORAL | 4 refills | Status: AC

## 2024-04-11 NOTE — Addendum Note (Signed)
 Addended by: Ziaire Hagos R on: 04/11/2024 02:32 PM   Modules accepted: Orders

## 2024-04-12 ENCOUNTER — Other Ambulatory Visit: Payer: Self-pay

## 2024-04-12 ENCOUNTER — Encounter: Payer: Self-pay | Admitting: Hematology & Oncology

## 2024-04-17 ENCOUNTER — Telehealth: Payer: Self-pay

## 2024-04-17 NOTE — Telephone Encounter (Signed)
 Notified the pt regarding her Disability forms being completed,faxed, and confirmation received. Pt stated that she would like her copy mailed to her address.

## 2024-04-18 ENCOUNTER — Other Ambulatory Visit: Payer: Self-pay

## 2024-04-18 ENCOUNTER — Other Ambulatory Visit (HOSPITAL_COMMUNITY): Payer: Self-pay

## 2024-04-18 ENCOUNTER — Other Ambulatory Visit: Payer: Self-pay | Admitting: Hematology & Oncology

## 2024-04-18 MED ORDER — IBRANCE 100 MG PO TABS
ORAL_TABLET | ORAL | 0 refills | Status: DC
  Filled 2024-04-18: qty 21, 28d supply, fill #0

## 2024-04-21 ENCOUNTER — Other Ambulatory Visit: Payer: Self-pay | Admitting: Hematology & Oncology

## 2024-04-21 DIAGNOSIS — C7951 Secondary malignant neoplasm of bone: Secondary | ICD-10-CM

## 2024-04-21 DIAGNOSIS — C50919 Malignant neoplasm of unspecified site of unspecified female breast: Secondary | ICD-10-CM

## 2024-04-22 ENCOUNTER — Encounter: Payer: Self-pay | Admitting: Hematology & Oncology

## 2024-04-22 ENCOUNTER — Other Ambulatory Visit: Payer: Self-pay

## 2024-04-22 ENCOUNTER — Other Ambulatory Visit (HOSPITAL_COMMUNITY): Payer: Self-pay

## 2024-04-22 NOTE — Progress Notes (Signed)
 Disenrolling patient from [facility].   The pharmacy contacted Mar to schedule a refill of Ibrance . She reported that her provider has discontinued therapy. Per the office visit dated 04/09/2024: Ibrance  100 mg p.o. daily (21 days on/7 days off) - initiated 04/2020, discontinued 04/09/2024.

## 2024-04-30 ENCOUNTER — Other Ambulatory Visit: Payer: Self-pay | Admitting: Hematology & Oncology

## 2024-04-30 DIAGNOSIS — C50919 Malignant neoplasm of unspecified site of unspecified female breast: Secondary | ICD-10-CM

## 2024-04-30 DIAGNOSIS — C7951 Secondary malignant neoplasm of bone: Secondary | ICD-10-CM

## 2024-05-07 ENCOUNTER — Inpatient Hospital Stay

## 2024-05-07 ENCOUNTER — Inpatient Hospital Stay: Admitting: Hematology & Oncology

## 2024-05-08 NOTE — Progress Notes (Signed)
 Pharmacist Chemotherapy Monitoring - Initial Assessment    Anticipated start date: 05/15/24   The following has been reviewed per standard work regarding the patient's treatment regimen: The patient's diagnosis, treatment plan and drug doses, and organ/hematologic function Lab orders and baseline tests specific to treatment regimen  The treatment plan start date, drug sequencing, and pre-medications Prior authorization status  Patient's documented medication list, including drug-drug interaction screen and prescriptions for anti-emetics and supportive care specific to the treatment regimen The drug concentrations, fluid compatibility, administration routes, and timing of the medications to be used The patient's access for treatment and lifetime cumulative dose history, if applicable  The patient's medication allergies and previous infusion related reactions, if applicable   Changes made to treatment plan:  N/A  Follow up needed:  Follow up eye exam scheduled for 05/14/24.   Micael Olam Browning, [facility], 05/08/2024  12:03 PM

## 2024-05-14 LAB — OPHTHALMOLOGY REPORT-SCANNED

## 2024-05-15 ENCOUNTER — Inpatient Hospital Stay

## 2024-05-15 ENCOUNTER — Inpatient Hospital Stay: Admitting: Hematology & Oncology

## 2024-05-15 ENCOUNTER — Encounter: Payer: Self-pay | Admitting: Hematology & Oncology

## 2024-05-15 ENCOUNTER — Inpatient Hospital Stay: Attending: Hematology & Oncology

## 2024-05-15 VITALS — BP 118/77 | HR 98 | Temp 97.6°F | Resp 18 | Ht 68.0 in | Wt 185.1 lb

## 2024-05-15 DIAGNOSIS — C7951 Secondary malignant neoplasm of bone: Secondary | ICD-10-CM

## 2024-05-15 DIAGNOSIS — Z1732 Human epidermal growth factor receptor 2 negative status: Secondary | ICD-10-CM | POA: Diagnosis not present

## 2024-05-15 DIAGNOSIS — C50919 Malignant neoplasm of unspecified site of unspecified female breast: Secondary | ICD-10-CM

## 2024-05-15 DIAGNOSIS — Z79899 Other long term (current) drug therapy: Secondary | ICD-10-CM | POA: Insufficient documentation

## 2024-05-15 DIAGNOSIS — Z7952 Long term (current) use of systemic steroids: Secondary | ICD-10-CM | POA: Diagnosis not present

## 2024-05-15 DIAGNOSIS — Z79634 Long term (current) use of topoisomerase inhibitor: Secondary | ICD-10-CM | POA: Insufficient documentation

## 2024-05-15 DIAGNOSIS — Z1722 Progesterone receptor negative status: Secondary | ICD-10-CM | POA: Insufficient documentation

## 2024-05-15 DIAGNOSIS — D61818 Other pancytopenia: Secondary | ICD-10-CM | POA: Diagnosis not present

## 2024-05-15 DIAGNOSIS — Z923 Personal history of irradiation: Secondary | ICD-10-CM | POA: Diagnosis not present

## 2024-05-15 DIAGNOSIS — Z5112 Encounter for antineoplastic immunotherapy: Secondary | ICD-10-CM | POA: Diagnosis present

## 2024-05-15 DIAGNOSIS — D509 Iron deficiency anemia, unspecified: Secondary | ICD-10-CM | POA: Insufficient documentation

## 2024-05-15 DIAGNOSIS — E538 Deficiency of other specified B group vitamins: Secondary | ICD-10-CM | POA: Insufficient documentation

## 2024-05-15 DIAGNOSIS — Z170 Estrogen receptor positive status [ER+]: Secondary | ICD-10-CM | POA: Insufficient documentation

## 2024-05-15 DIAGNOSIS — R978 Other abnormal tumor markers: Secondary | ICD-10-CM | POA: Diagnosis not present

## 2024-05-15 DIAGNOSIS — C50012 Malignant neoplasm of nipple and areola, left female breast: Secondary | ICD-10-CM

## 2024-05-15 LAB — CMP (CANCER CENTER ONLY)
ALT: 27 U/L (ref 0–44)
AST: 24 U/L (ref 15–41)
Albumin: 4.3 g/dL (ref 3.5–5.0)
Alkaline Phosphatase: 60 U/L (ref 38–126)
Anion gap: 10 (ref 5–15)
BUN: 8 mg/dL (ref 6–20)
CO2: 25 mmol/L (ref 22–32)
Calcium: 9.3 mg/dL (ref 8.9–10.3)
Chloride: 108 mmol/L (ref 98–111)
Creatinine: 0.94 mg/dL (ref 0.44–1.00)
GFR, Estimated: >60 mL/min
Glucose, Bld: 105 mg/dL — ABNORMAL HIGH (ref 70–99)
Potassium: 3.4 mmol/L — ABNORMAL LOW (ref 3.5–5.1)
Sodium: 143 mmol/L (ref 135–145)
Total Bilirubin: 0.6 mg/dL (ref 0.0–1.2)
Total Protein: 6.7 g/dL (ref 6.5–8.1)

## 2024-05-15 LAB — CBC WITH DIFFERENTIAL (CANCER CENTER ONLY)
Abs Immature Granulocytes: 0.01 K/uL (ref 0.00–0.07)
Basophils Absolute: 0.1 K/uL (ref 0.0–0.1)
Basophils Relative: 1 %
Eosinophils Absolute: 0.2 K/uL (ref 0.0–0.5)
Eosinophils Relative: 5 %
HCT: 34.8 % — ABNORMAL LOW (ref 36.0–46.0)
Hemoglobin: 12 g/dL (ref 12.0–15.0)
Immature Granulocytes: 0 %
Lymphocytes Relative: 16 %
Lymphs Abs: 0.7 K/uL (ref 0.7–4.0)
MCH: 35.1 pg — ABNORMAL HIGH (ref 26.0–34.0)
MCHC: 34.5 g/dL (ref 30.0–36.0)
MCV: 101.8 fL — ABNORMAL HIGH (ref 80.0–100.0)
Monocytes Absolute: 0.5 K/uL (ref 0.1–1.0)
Monocytes Relative: 11 %
Neutro Abs: 3 K/uL (ref 1.7–7.7)
Neutrophils Relative %: 67 %
Platelet Count: 213 K/uL (ref 150–400)
RBC: 3.42 MIL/uL — ABNORMAL LOW (ref 3.87–5.11)
RDW: 13.4 % (ref 11.5–15.5)
WBC Count: 4.5 K/uL (ref 4.0–10.5)
nRBC: 0 % (ref 0.0–0.2)

## 2024-05-15 MED ORDER — PALONOSETRON HCL INJECTION 0.25 MG/5ML
0.2500 mg | Freq: Once | INTRAVENOUS | Status: AC
  Administered 2024-05-15: 0.25 mg via INTRAVENOUS
  Filled 2024-05-15: qty 5

## 2024-05-15 MED ORDER — APREPITANT 130 MG/18ML IV EMUL
130.0000 mg | Freq: Once | INTRAVENOUS | Status: AC
  Administered 2024-05-15: 130 mg via INTRAVENOUS
  Filled 2024-05-15: qty 18

## 2024-05-15 MED ORDER — DEXTROSE 5 % IV SOLN
6.0000 mg/kg | Freq: Once | INTRAVENOUS | Status: AC
  Administered 2024-05-15: 500 mg via INTRAVENOUS
  Filled 2024-05-15: qty 25

## 2024-05-15 MED ORDER — DIPHENHYDRAMINE HCL 25 MG PO CAPS
50.0000 mg | ORAL_CAPSULE | Freq: Once | ORAL | Status: AC
  Administered 2024-05-15: 50 mg via ORAL
  Filled 2024-05-15: qty 2

## 2024-05-15 MED ORDER — DEXTROSE 5 % IV SOLN: INTRAVENOUS | Status: DC

## 2024-05-15 MED ORDER — DENOSUMAB 120 MG/1.7ML ~~LOC~~ SOLN
120.0000 mg | Freq: Once | SUBCUTANEOUS | Status: AC
  Administered 2024-05-15: 120 mg via SUBCUTANEOUS
  Filled 2024-05-15: qty 1.7

## 2024-05-15 MED ORDER — ACETAMINOPHEN 325 MG PO TABS
650.0000 mg | ORAL_TABLET | Freq: Once | ORAL | Status: AC
  Administered 2024-05-15: 650 mg via ORAL
  Filled 2024-05-15: qty 2

## 2024-05-15 MED ORDER — CYANOCOBALAMIN 1000 MCG/ML IJ SOLN
1000.0000 ug | Freq: Once | INTRAMUSCULAR | Status: AC
  Administered 2024-05-15: 1000 ug via INTRAMUSCULAR
  Filled 2024-05-15: qty 1

## 2024-05-15 MED ORDER — DEXAMETHASONE SOD PHOSPHATE PF 10 MG/ML IJ SOLN
10.0000 mg | Freq: Once | INTRAMUSCULAR | Status: AC
  Administered 2024-05-15: 10 mg via INTRAVENOUS
  Filled 2024-05-15: qty 1

## 2024-05-15 NOTE — Patient Instructions (Signed)

## 2024-05-15 NOTE — Patient Instructions (Signed)
 [facility] - A DEPT OF [facility]  Discharge Instructions: Thank you for choosing [facility] to provide your oncology and hematology care.   If you have a lab appointment with the Cancer Center, please go directly to the Cancer Center and check in at the registration area.  Wear comfortable clothing and clothing appropriate for easy access to any Portacath or PICC line.   We strive to give you quality time with your provider. You may need to reschedule your appointment if you arrive late (15 or more minutes).  Arriving late affects you and other patients whose appointments are after yours.  Also, if you miss three or more appointments without notifying the office, you may be dismissed from the clinic at the providers discretion.      For prescription refill requests, have your pharmacy contact our office and allow 72 hours for refills to be completed.    Today you received the following chemotherapy and/or immunotherapy agents DatopotamabDeruxtecan      To help prevent nausea and vomiting after your treatment, we encourage you to take your nausea medication as directed.  BELOW ARE SYMPTOMS THAT SHOULD BE REPORTED IMMEDIATELY: *FEVER GREATER THAN 100.4 F (38 C) OR HIGHER *CHILLS OR SWEATING *NAUSEA AND VOMITING THAT IS NOT CONTROLLED WITH YOUR NAUSEA MEDICATION *UNUSUAL SHORTNESS OF BREATH *UNUSUAL BRUISING OR BLEEDING *URINARY PROBLEMS (pain or burning when urinating, or frequent urination) *BOWEL PROBLEMS (unusual diarrhea, constipation, pain near the anus) TENDERNESS IN MOUTH AND THROAT WITH OR WITHOUT PRESENCE OF ULCERS (sore throat, sores in mouth, or a toothache) UNUSUAL RASH, SWELLING OR PAIN  UNUSUAL VAGINAL DISCHARGE OR ITCHING   Items with * indicate a potential emergency and should be followed up as soon as possible or go to the Emergency Department if any problems should occur.  Please show the CHEMOTHERAPY ALERT CARD or  IMMUNOTHERAPY ALERT CARD at check-in to the Emergency Department and triage nurse. Should you have questions after your visit or need to cancel or reschedule your appointment, please contact [facility] - A DEPT OF [facility]  [phone] and follow the prompts.  Office hours are 8:00 a.m. to 4:30 p.m. Monday - Friday. Please note that voicemails left after 4:00 p.m. may not be returned until the following business day.  We are closed weekends and major holidays. You have access to a nurse at all times for urgent questions. Please call the main number to the clinic [phone] and follow the prompts.  For any non-urgent questions, you may also contact your provider using MyChart. We now offer e-Visits for anyone 79 and older to request care online for non-urgent symptoms. For details visit [email or web address].   Also download the MyChart app! Go to the app store, search MyChart, open the app, select [facility], and log in with your MyChart username and password.

## 2024-05-15 NOTE — Progress Notes (Signed)
 First time chemo today. Education completed, reviewed AVS and meds/appt. schedule. Pt. chewed on ice chips pre and during treatment. Tolerated infusion well.  Rx in to see and answered pt's questions re: eye drops. Stable on d/c.

## 2024-05-15 NOTE — Progress Notes (Signed)
 " Hematology and Oncology Follow Up Visit  Caitlin Greene [id number] 17-Nov-1965 59 y.o. 05/15/2024   Principle Diagnosis:  Metastatic breast cancer-ER positive, PR negative/HER-2 negative/ ESR1(-) --bone metastasis only Iron  deficiency anemia Vitamin B12 deficiency  Current Therapy:   Faslodex  500 mg IM monthly --start on 04/2020 - d/c on 04/09/2024 Ibrance  100 mg p.o. daily (21d on/7d off) - start on 04/2020 - d/c on 04/09/2024 Xgeva  120 mg subcu every 3 months -next dose 08/2024  IV iron -Venofer  given on 02/06/2024    Status post XRT for right sacral lesion -completed on 03/03/2023 -- 3000 rad Vitamin B12 1000 mcg IM monthly --start on 06/2023 Datroway  - s/p cycle #1 - start on 05/15/2024     Interim History:  Caitlin Greene is in for follow-up.  We will start her on the Datroway  today.  She has had her eye exam.  Everything turned out well.  She got eyedrops from the optometrist.  Her last CA 27.29 was a little bit further elevated at 99.  She had a nice Christmas and New Years.  She was with her family and her friends.  She has had no problems with pain.  She has had no problems with cough or shortness of breath.   Overall, I would say that her performance status is probably ECOG 1.      Wt Readings from Last 3 Encounters:  05/15/24 185 lb 1.9 oz (84 kg)  04/09/24 185 lb (83.9 kg)  03/05/24 190 lb (86.2 kg)    Medications:  Current Outpatient Medications:    acetaminophen  (TYLENOL ) 650 MG CR tablet, Take 650 mg by mouth every 8 (eight) hours as needed for pain., Disp: , Rfl:    calcium  carbonate (OS-CAL) 600 MG tablet, Take 1 tablet (600 mg total) by mouth 2 (two) times daily., Disp: 30 tablet, Rfl: 0   Carboxymethylcell-Glycerin  PF 0.5-0.9 % SOLN, Place 2 drops into both eyes 4 (four) times daily. And as needed., Disp: 1 each, Rfl: 11   cetirizine (ZYRTEC ALLERGY) 10 MG tablet, Daily prn, Disp: , Rfl:    dexamethasone  (DECADRON ) 0.5 MG/5ML solution, Swish 10 mL (1 mg) by mouth  for 2 minutes, then spit. Repeat 4 times daily. Start on 1st day of datopotamab deruxtecan., Disp: 500 mL, Rfl: 4   dexamethasone  (DECADRON ) 4 MG tablet, Take 2 tablets (8 mg) by mouth daily for 3 days starting the day after chemotherapy. Take with food., Disp: 30 tablet, Rfl: 1   Ferrous Sulfate (IRON ) 325 (65 Fe) MG TABS, Take 1 tablet (325 mg total) by mouth daily at 2 PM., Disp: 30 tablet, Rfl: 0   FLUoxetine  (PROZAC ) 20 MG capsule, TAKE 3 CAPSULES(60 MG) BY MOUTH DAILY, Disp: 270 capsule, Rfl: 3   fulvestrant  (FASLODEX ) 250 MG/5ML injection, Inject 500 mg into the muscle every 30 (thirty) days. One injection each buttock over 1-2 minutes. Warm prior to use., Disp: , Rfl:    Glycerin , PF, (BIOTRUE LUBRICANT) 0.5 % SOLN, Apply to eye as needed., Disp: , Rfl:    LORazepam  (ATIVAN ) 1 MG tablet, Take 1 tablet (1 mg total) by mouth once as needed for up to 1 dose for anxiety. Take 1 tablet (1 mg total) by mouth 20 minutes before MRI scan. Please have a driver for this test as this medication has sedating effects., Disp: 1 tablet, Rfl: 0   ondansetron  (ZOFRAN ) 8 MG tablet, Take 1 tablet (8 mg total) by mouth every 8 (eight) hours as needed for nausea or vomiting. Start  on the third day after chemotherapy., Disp: 30 tablet, Rfl: 1   oxyCODONE  (OXY IR/ROXICODONE ) 5 MG immediate release tablet, Take 1 tablet (5 mg total) by mouth every 8 (eight) hours as needed for severe pain., Disp: 50 tablet, Rfl: 0   pantoprazole  (PROTONIX ) 40 MG tablet, Take 1 tablet (40 mg total) by mouth 2 (two) times daily., Disp: 60 tablet, Rfl: 5   prochlorperazine  (COMPAZINE ) 10 MG tablet, TAKE 1 TABLET(10 MG) BY MOUTH EVERY 6 HOURS AS NEEDED FOR NAUSEA OR VOMITING, Disp: 30 tablet, Rfl: 1   vitamin B-12 (CYANOCOBALAMIN ) 1000 MCG tablet, Take 1 tablet (1,000 mcg total) by mouth daily., Disp: 30 tablet, Rfl: 0   Wheat Dextrin (BENEFIBER DRINK MIX PO), Take by mouth daily at 6 (six) AM., Disp: , Rfl:   Allergies:  Allergies   Allergen Reactions   Amoxicillin-Pot Clavulanate Hives and Itching   Iron  Anaphylaxis and Other (See Comments)    Patient develops hypotension, resp distress after infusion of feraheme .    Penicillins Hives, Itching and Other (See Comments)   Codeine Nausea And Vomiting    Needs pre-meds     Past Medical History, Surgical history, Social history, and Family History were reviewed and updated.  Review of Systems: Review of Systems  Constitutional:  Negative for appetite change.  HENT:  Negative.    Eyes: Negative.   Respiratory: Negative.    Cardiovascular: Negative.   Gastrointestinal:  Negative for abdominal pain and nausea.  Endocrine: Negative.   Genitourinary: Negative.    Musculoskeletal:  Positive for arthralgias and back pain.  Skin: Negative.   Neurological:  Negative for dizziness.  Hematological: Negative.   Psychiatric/Behavioral: Negative.      Physical Exam:  height is 5' 8 (1.727 m) and weight is 185 lb 1.9 oz (84 kg). Her oral temperature is 97.6 F (36.4 C). Her blood pressure is 118/77 and her pulse is 98. Her respiration is 18 and oxygen saturation is 99%.   Wt Readings from Last 3 Encounters:  05/15/24 185 lb 1.9 oz (84 kg)  04/09/24 185 lb (83.9 kg)  03/05/24 190 lb (86.2 kg)    Physical Exam Vitals reviewed.  HENT:     Head: Normocephalic and atraumatic.  Eyes:     Pupils: Pupils are equal, round, and reactive to light.  Cardiovascular:     Rate and Rhythm: Normal rate and regular rhythm.     Heart sounds: Normal heart sounds.  Pulmonary:     Effort: Pulmonary effort is normal.     Breath sounds: Normal breath sounds.  Abdominal:     General: Bowel sounds are normal.     Palpations: Abdomen is soft.  Musculoskeletal:        General: No tenderness or deformity. Normal range of motion.     Cervical back: Normal range of motion.  Lymphadenopathy:     Cervical: No cervical adenopathy.  Skin:    General: Skin is warm and dry.      Findings: No erythema or rash.  Neurological:     Mental Status: She is alert and oriented to person, place, and time.  Psychiatric:        Behavior: Behavior normal.        Thought Content: Thought content normal.        Judgment: Judgment normal.     Lab Results  Component Value Date   WBC 4.5 05/15/2024   HGB 12.0 05/15/2024   HCT 34.8 (L) 05/15/2024   MCV 101.8 (  H) 05/15/2024   PLT 213 05/15/2024     Chemistry      Component Value Date/Time   NA 140 04/09/2024 0920   K 3.6 04/09/2024 0920   CL 105 04/09/2024 0920   CO2 24 04/09/2024 0920   BUN 8 04/09/2024 0920   CREATININE 0.86 04/09/2024 0920   CREATININE 0.88 10/22/2019 1246      Component Value Date/Time   CALCIUM  8.4 (L) 04/09/2024 0920   ALKPHOS 58 04/09/2024 0920   AST 13 (L) 04/09/2024 0920   ALT 10 04/09/2024 0920   BILITOT 0.5 04/09/2024 0920      Impression and Plan: Caitlin Greene is a very charming 59 year old postmenopausal female with metastatic breast cancer. She is on antiestrogen therapy. She also has pancytopenia felt to be secondary to her Ibrance .   We will now start her on Datroway .  I think this would be very reasonable to try on her.  We will go ahead and get her started.  Will have her come back to see us  in another 3 weeks or so..  I think she does get B12 injections..  We will also give her her Xgeva  today.  SABRA  MAUDE JONELLE CREASE, MD  "

## 2024-05-16 ENCOUNTER — Encounter: Payer: Self-pay | Admitting: *Deleted

## 2024-05-16 ENCOUNTER — Telehealth: Payer: Self-pay | Admitting: *Deleted

## 2024-05-16 LAB — CANCER ANTIGEN 27.29: CA 27.29: 140.4 U/mL — ABNORMAL HIGH (ref 0.0–38.6)

## 2024-05-16 NOTE — Telephone Encounter (Signed)
 Chemo call back done.

## 2024-06-05 ENCOUNTER — Inpatient Hospital Stay: Admitting: Hematology & Oncology

## 2024-06-05 ENCOUNTER — Inpatient Hospital Stay

## 2024-06-05 ENCOUNTER — Encounter: Payer: Self-pay | Admitting: Hematology & Oncology

## 2024-06-05 ENCOUNTER — Ambulatory Visit: Payer: Self-pay | Admitting: Hematology & Oncology

## 2024-06-05 VITALS — BP 109/77 | HR 87 | Temp 98.2°F | Resp 20

## 2024-06-05 VITALS — BP 115/78 | HR 87 | Temp 98.2°F | Resp 18 | Ht 68.0 in | Wt 182.0 lb

## 2024-06-05 DIAGNOSIS — C7951 Secondary malignant neoplasm of bone: Secondary | ICD-10-CM | POA: Diagnosis not present

## 2024-06-05 DIAGNOSIS — C50919 Malignant neoplasm of unspecified site of unspecified female breast: Secondary | ICD-10-CM | POA: Diagnosis not present

## 2024-06-05 DIAGNOSIS — Z5112 Encounter for antineoplastic immunotherapy: Secondary | ICD-10-CM | POA: Diagnosis not present

## 2024-06-05 LAB — CBC WITH DIFFERENTIAL (CANCER CENTER ONLY)
Abs Immature Granulocytes: 0.01 10*3/uL (ref 0.00–0.07)
Basophils Absolute: 0 10*3/uL (ref 0.0–0.1)
Basophils Relative: 1 %
Eosinophils Absolute: 0.2 10*3/uL (ref 0.0–0.5)
Eosinophils Relative: 4 %
HCT: 34.7 % — ABNORMAL LOW (ref 36.0–46.0)
Hemoglobin: 12.2 g/dL (ref 12.0–15.0)
Immature Granulocytes: 0 %
Lymphocytes Relative: 18 %
Lymphs Abs: 0.6 10*3/uL — ABNORMAL LOW (ref 0.7–4.0)
MCH: 34.5 pg — ABNORMAL HIGH (ref 26.0–34.0)
MCHC: 35.2 g/dL (ref 30.0–36.0)
MCV: 98 fL (ref 80.0–100.0)
Monocytes Absolute: 0.3 10*3/uL (ref 0.1–1.0)
Monocytes Relative: 10 %
Neutro Abs: 2.3 10*3/uL (ref 1.7–7.7)
Neutrophils Relative %: 67 %
Platelet Count: 180 10*3/uL (ref 150–400)
RBC: 3.54 MIL/uL — ABNORMAL LOW (ref 3.87–5.11)
RDW: 13.7 % (ref 11.5–15.5)
WBC Count: 3.5 10*3/uL — ABNORMAL LOW (ref 4.0–10.5)
nRBC: 0 % (ref 0.0–0.2)

## 2024-06-05 LAB — CMP (CANCER CENTER ONLY)
ALT: 21 U/L (ref 0–44)
AST: 19 U/L (ref 15–41)
Albumin: 4 g/dL (ref 3.5–5.0)
Alkaline Phosphatase: 62 U/L (ref 38–126)
Anion gap: 9 (ref 5–15)
BUN: 13 mg/dL (ref 6–20)
CO2: 27 mmol/L (ref 22–32)
Calcium: 9.1 mg/dL (ref 8.9–10.3)
Chloride: 106 mmol/L (ref 98–111)
Creatinine: 0.89 mg/dL (ref 0.44–1.00)
GFR, Estimated: >60 mL/min
Glucose, Bld: 92 mg/dL (ref 70–99)
Potassium: 3.5 mmol/L (ref 3.5–5.1)
Sodium: 141 mmol/L (ref 135–145)
Total Bilirubin: 0.4 mg/dL (ref 0.0–1.2)
Total Protein: 6.3 g/dL — ABNORMAL LOW (ref 6.5–8.1)

## 2024-06-05 LAB — IRON AND IRON BINDING CAPACITY (CC-WL,HP ONLY)
Iron: 58 ug/dL (ref 28–170)
Saturation Ratios: 20 % (ref 10.4–31.8)
TIBC: 290 ug/dL (ref 250–450)
UIBC: 232 ug/dL

## 2024-06-05 LAB — FERRITIN: Ferritin: 156 ng/mL (ref 11–307)

## 2024-06-05 LAB — RETICULOCYTES
Immature Retic Fract: 19.5 % — ABNORMAL HIGH (ref 2.3–15.9)
RBC.: 3.62 MIL/uL — ABNORMAL LOW (ref 3.87–5.11)
Retic Count, Absolute: 78.6 10*3/uL (ref 19.0–186.0)
Retic Ct Pct: 2.2 % (ref 0.4–3.1)

## 2024-06-05 MED ORDER — DEXAMETHASONE SOD PHOSPHATE PF 10 MG/ML IJ SOLN
10.0000 mg | Freq: Once | INTRAMUSCULAR | Status: AC
  Administered 2024-06-05: 10 mg via INTRAVENOUS
  Filled 2024-06-05: qty 1

## 2024-06-05 MED ORDER — DEXTROSE 5 % IV SOLN: INTRAVENOUS | Status: DC

## 2024-06-05 MED ORDER — DIPHENHYDRAMINE HCL 25 MG PO CAPS
50.0000 mg | ORAL_CAPSULE | Freq: Once | ORAL | Status: AC
  Administered 2024-06-05: 50 mg via ORAL
  Filled 2024-06-05: qty 2

## 2024-06-05 MED ORDER — ACETAMINOPHEN 325 MG PO TABS
650.0000 mg | ORAL_TABLET | Freq: Once | ORAL | Status: AC
  Administered 2024-06-05: 650 mg via ORAL
  Filled 2024-06-05: qty 2

## 2024-06-05 MED ORDER — APREPITANT 130 MG/18ML IV EMUL
130.0000 mg | Freq: Once | INTRAVENOUS | Status: AC
  Administered 2024-06-05: 130 mg via INTRAVENOUS
  Filled 2024-06-05: qty 18

## 2024-06-05 MED ORDER — DEXTROSE 5 % IV SOLN
6.0000 mg/kg | Freq: Once | INTRAVENOUS | Status: AC
  Administered 2024-06-05: 500 mg via INTRAVENOUS
  Filled 2024-06-05: qty 25

## 2024-06-05 MED ORDER — PALONOSETRON HCL INJECTION 0.25 MG/5ML
0.2500 mg | Freq: Once | INTRAVENOUS | Status: AC
  Administered 2024-06-05: 0.25 mg via INTRAVENOUS
  Filled 2024-06-05: qty 5

## 2024-06-05 NOTE — Progress Notes (Signed)
 " Hematology and Oncology Follow Up Visit  Caitlin Greene [id number] 1965-09-20 59 y.o. 06/05/2024   Principle Diagnosis:  Metastatic breast cancer-ER positive, PR negative/HER-2 negative/ ESR1(-) --bone metastasis only Iron  deficiency anemia Vitamin B12 deficiency  Current Therapy:   Faslodex  500 mg IM monthly --start on 04/2020 - d/c on 04/09/2024 Ibrance  100 mg p.o. daily (21d on/7d off) - start on 04/2020 - d/c on 04/09/2024 Xgeva  120 mg subcu every 3 months -next dose 08/2024  IV iron -Venofer  given on 02/06/2024    Status post XRT for right sacral lesion -completed on 03/03/2023 -- 3000 rad Vitamin B12 1000 mcg IM monthly --start on 06/2023 Datroway  - s/p cycle #1 - start on 05/15/2024     Interim History:  Caitlin Greene is in for follow-up.  She seemed to tolerate the first cycle of Datroway  pretty well.  Hopefully, it will help her out.  When we did do her liquid biopsy, there really was no genetic mutations that we could target.  She has had no problems with pain.  There is been no problems with bleeding.  She has had no change in bowel or bladder habits.  She has had no rashes.  There is been no headache.  Her last CA 27.29 will restart her treatment was up to 140.  Overall, I would say that her performance status is probably ECOG 1.     Wt Readings from Last 3 Encounters:  06/05/24 182 lb (82.6 kg)  05/15/24 185 lb 1.9 oz (84 kg)  04/09/24 185 lb (83.9 kg)    Medications:  Current Outpatient Medications:    acetaminophen  (TYLENOL ) 650 MG CR tablet, Take 650 mg by mouth every 8 (eight) hours as needed for pain., Disp: , Rfl:    calcium  carbonate (OS-CAL) 600 MG tablet, Take 1 tablet (600 mg total) by mouth 2 (two) times daily., Disp: 30 tablet, Rfl: 0   Carboxymethylcell-Glycerin  PF 0.5-0.9 % SOLN, Place 2 drops into both eyes 4 (four) times daily. And as needed., Disp: 1 each, Rfl: 11   cetirizine (ZYRTEC ALLERGY) 10 MG tablet, Daily prn, Disp: , Rfl:    dexamethasone   (DECADRON ) 0.5 MG/5ML solution, Swish 10 mL (1 mg) by mouth for 2 minutes, then spit. Repeat 4 times daily. Start on 1st day of datopotamab deruxtecan., Disp: 500 mL, Rfl: 4   dexamethasone  (DECADRON ) 4 MG tablet, Take 2 tablets (8 mg) by mouth daily for 3 days starting the day after chemotherapy. Take with food., Disp: 30 tablet, Rfl: 1   Ferrous Sulfate (IRON ) 325 (65 Fe) MG TABS, Take 1 tablet (325 mg total) by mouth daily at 2 PM., Disp: 30 tablet, Rfl: 0   FLUoxetine  (PROZAC ) 20 MG capsule, TAKE 3 CAPSULES(60 MG) BY MOUTH DAILY, Disp: 270 capsule, Rfl: 3   fulvestrant  (FASLODEX ) 250 MG/5ML injection, Inject 500 mg into the muscle every 30 (thirty) days. One injection each buttock over 1-2 minutes. Warm prior to use., Disp: , Rfl:    Glycerin , PF, (BIOTRUE LUBRICANT) 0.5 % SOLN, Apply to eye as needed., Disp: , Rfl:    LORazepam  (ATIVAN ) 1 MG tablet, Take 1 tablet (1 mg total) by mouth once as needed for up to 1 dose for anxiety. Take 1 tablet (1 mg total) by mouth 20 minutes before MRI scan. Please have a driver for this test as this medication has sedating effects., Disp: 1 tablet, Rfl: 0   ondansetron  (ZOFRAN ) 8 MG tablet, Take 1 tablet (8 mg total) by mouth every 8 (eight)  hours as needed for nausea or vomiting. Start on the third day after chemotherapy., Disp: 30 tablet, Rfl: 1   oxyCODONE  (OXY IR/ROXICODONE ) 5 MG immediate release tablet, Take 1 tablet (5 mg total) by mouth every 8 (eight) hours as needed for severe pain., Disp: 50 tablet, Rfl: 0   pantoprazole  (PROTONIX ) 40 MG tablet, Take 1 tablet (40 mg total) by mouth 2 (two) times daily., Disp: 60 tablet, Rfl: 5   prochlorperazine  (COMPAZINE ) 10 MG tablet, TAKE 1 TABLET(10 MG) BY MOUTH EVERY 6 HOURS AS NEEDED FOR NAUSEA OR VOMITING, Disp: 30 tablet, Rfl: 1   vitamin B-12 (CYANOCOBALAMIN ) 1000 MCG tablet, Take 1 tablet (1,000 mcg total) by mouth daily., Disp: 30 tablet, Rfl: 0   Wheat Dextrin (BENEFIBER DRINK MIX PO), Take by mouth daily at  6 (six) AM., Disp: , Rfl:   Allergies:  Allergies  Allergen Reactions   Amoxicillin-Pot Clavulanate Hives and Itching   Iron  Anaphylaxis and Other (See Comments)    Patient develops hypotension, resp distress after infusion of feraheme .    Penicillins Hives, Itching and Other (See Comments)   Codeine Nausea And Vomiting    Needs pre-meds     Past Medical History, Surgical history, Social history, and Family History were reviewed and updated.  Review of Systems: Review of Systems  Constitutional:  Negative for appetite change.  HENT:  Negative.    Eyes: Negative.   Respiratory: Negative.    Cardiovascular: Negative.   Gastrointestinal:  Negative for abdominal pain and nausea.  Endocrine: Negative.   Genitourinary: Negative.    Musculoskeletal:  Positive for arthralgias and back pain.  Skin: Negative.   Neurological:  Negative for dizziness.  Hematological: Negative.   Psychiatric/Behavioral: Negative.      Physical Exam:  height is 5' 8 (1.727 m) and weight is 182 lb (82.6 kg). Her oral temperature is 98.2 F (36.8 C). Her blood pressure is 115/78 and her pulse is 87. Her respiration is 18 and oxygen saturation is 95%.   Wt Readings from Last 3 Encounters:  06/05/24 182 lb (82.6 kg)  05/15/24 185 lb 1.9 oz (84 kg)  04/09/24 185 lb (83.9 kg)    Physical Exam Vitals reviewed.  HENT:     Head: Normocephalic and atraumatic.  Eyes:     Pupils: Pupils are equal, round, and reactive to light.  Cardiovascular:     Rate and Rhythm: Normal rate and regular rhythm.     Heart sounds: Normal heart sounds.  Pulmonary:     Effort: Pulmonary effort is normal.     Breath sounds: Normal breath sounds.  Abdominal:     General: Bowel sounds are normal.     Palpations: Abdomen is soft.  Musculoskeletal:        General: No tenderness or deformity. Normal range of motion.     Cervical back: Normal range of motion.  Lymphadenopathy:     Cervical: No cervical adenopathy.   Skin:    General: Skin is warm and dry.     Findings: No erythema or rash.  Neurological:     Mental Status: She is alert and oriented to person, place, and time.  Psychiatric:        Behavior: Behavior normal.        Thought Content: Thought content normal.        Judgment: Judgment normal.     Lab Results  Component Value Date   WBC 3.5 (L) 06/05/2024   HGB 12.2 06/05/2024   HCT  34.7 (L) 06/05/2024   MCV 98.0 06/05/2024   PLT 180 06/05/2024     Chemistry      Component Value Date/Time   NA 141 06/05/2024 0920   K 3.5 06/05/2024 0920   CL 106 06/05/2024 0920   CO2 27 06/05/2024 0920   BUN 13 06/05/2024 0920   CREATININE 0.89 06/05/2024 0920   CREATININE 0.88 10/22/2019 1246      Component Value Date/Time   CALCIUM  9.1 06/05/2024 0920   ALKPHOS 62 06/05/2024 0920   AST 19 06/05/2024 0920   ALT 21 06/05/2024 0920   BILITOT 0.4 06/05/2024 0920      Impression and Plan: Caitlin Greene is a very charming 59 year old postmenopausal female with metastatic breast cancer. She is on antiestrogen therapy. She also has pancytopenia felt to be secondary to her Ibrance .   Hopefully, the CA 27.29 will be down.  We will go with her second cycle of treatment.  I will give her a total of 3 or 4 cycles and then we will see about another scan.  I would just want her quality of life to be as good as possible.   MAUDE JONELLE CREASE, MD  "

## 2024-06-05 NOTE — Patient Instructions (Signed)
 [facility] - A DEPT OF [facility]  Discharge Instructions: Thank you for choosing [facility] to provide your oncology and hematology care.   If you have a lab appointment with the Cancer Center, please go directly to the Cancer Center and check in at the registration area.  Wear comfortable clothing and clothing appropriate for easy access to any Portacath or PICC line.   We strive to give you quality time with your provider. You may need to reschedule your appointment if you arrive late (15 or more minutes).  Arriving late affects you and other patients whose appointments are after yours.  Also, if you miss three or more appointments without notifying the office, you may be dismissed from the clinic at the providers discretion.      For prescription refill requests, have your pharmacy contact our office and allow 72 hours for refills to be completed.    Today you received the following chemotherapy and/or immunotherapy agents DatopotamabDeruxtecan      To help prevent nausea and vomiting after your treatment, we encourage you to take your nausea medication as directed.  BELOW ARE SYMPTOMS THAT SHOULD BE REPORTED IMMEDIATELY: *FEVER GREATER THAN 100.4 F (38 C) OR HIGHER *CHILLS OR SWEATING *NAUSEA AND VOMITING THAT IS NOT CONTROLLED WITH YOUR NAUSEA MEDICATION *UNUSUAL SHORTNESS OF BREATH *UNUSUAL BRUISING OR BLEEDING *URINARY PROBLEMS (pain or burning when urinating, or frequent urination) *BOWEL PROBLEMS (unusual diarrhea, constipation, pain near the anus) TENDERNESS IN MOUTH AND THROAT WITH OR WITHOUT PRESENCE OF ULCERS (sore throat, sores in mouth, or a toothache) UNUSUAL RASH, SWELLING OR PAIN  UNUSUAL VAGINAL DISCHARGE OR ITCHING   Items with * indicate a potential emergency and should be followed up as soon as possible or go to the Emergency Department if any problems should occur.  Please show the CHEMOTHERAPY ALERT CARD or  IMMUNOTHERAPY ALERT CARD at check-in to the Emergency Department and triage nurse. Should you have questions after your visit or need to cancel or reschedule your appointment, please contact [facility] - A DEPT OF [facility]  [phone] and follow the prompts.  Office hours are 8:00 a.m. to 4:30 p.m. Monday - Friday. Please note that voicemails left after 4:00 p.m. may not be returned until the following business day.  We are closed weekends and major holidays. You have access to a nurse at all times for urgent questions. Please call the main number to the clinic [phone] and follow the prompts.  For any non-urgent questions, you may also contact your provider using MyChart. We now offer e-Visits for anyone 79 and older to request care online for non-urgent symptoms. For details visit [email or web address].   Also download the MyChart app! Go to the app store, search MyChart, open the app, select [facility], and log in with your MyChart username and password.

## 2024-06-06 LAB — CANCER ANTIGEN 27.29: CA 27.29: 114.9 U/mL — ABNORMAL HIGH (ref 0.0–38.6)

## 2024-06-26 ENCOUNTER — Inpatient Hospital Stay

## 2024-06-26 ENCOUNTER — Inpatient Hospital Stay: Admitting: Hematology & Oncology

## 2024-06-26 ENCOUNTER — Inpatient Hospital Stay: Attending: Hematology & Oncology

## 2024-07-17 ENCOUNTER — Inpatient Hospital Stay: Admitting: Hematology & Oncology

## 2024-07-17 ENCOUNTER — Inpatient Hospital Stay

## 2024-07-17 ENCOUNTER — Inpatient Hospital Stay: Attending: Hematology & Oncology

## 2024-08-12 ENCOUNTER — Ambulatory Visit

## 2024-08-12 ENCOUNTER — Encounter: Admitting: Internal Medicine
